# Patient Record
Sex: Male | Born: 1945
Health system: Southern US, Community
[De-identification: ages and names within clinical notes are randomized; demographics above are authoritative.]

## PROBLEM LIST (undated history)

## (undated) DIAGNOSIS — E611 Iron deficiency: Secondary | ICD-10-CM

## (undated) DIAGNOSIS — I739 Peripheral vascular disease, unspecified: Secondary | ICD-10-CM

## (undated) DIAGNOSIS — N201 Calculus of ureter: Secondary | ICD-10-CM

## (undated) DIAGNOSIS — T4145XA Adverse effect of unspecified anesthetic, initial encounter: Secondary | ICD-10-CM

## (undated) DIAGNOSIS — B029 Zoster without complications: Secondary | ICD-10-CM

## (undated) DIAGNOSIS — Z87448 Personal history of other diseases of urinary system: Secondary | ICD-10-CM

## (undated) DIAGNOSIS — Z9889 Other specified postprocedural states: Secondary | ICD-10-CM

## (undated) DIAGNOSIS — Z860100 Personal history of colon polyps, unspecified: Secondary | ICD-10-CM

## (undated) DIAGNOSIS — E785 Hyperlipidemia, unspecified: Secondary | ICD-10-CM

## (undated) DIAGNOSIS — Z973 Presence of spectacles and contact lenses: Secondary | ICD-10-CM

## (undated) DIAGNOSIS — T8859XA Other complications of anesthesia, initial encounter: Secondary | ICD-10-CM

## (undated) DIAGNOSIS — N189 Chronic kidney disease, unspecified: Secondary | ICD-10-CM

## (undated) DIAGNOSIS — Z972 Presence of dental prosthetic device (complete) (partial): Secondary | ICD-10-CM

## (undated) DIAGNOSIS — Z85820 Personal history of malignant melanoma of skin: Secondary | ICD-10-CM

## (undated) DIAGNOSIS — T148XXA Other injury of unspecified body region, initial encounter: Secondary | ICD-10-CM

## (undated) DIAGNOSIS — R05 Cough: Secondary | ICD-10-CM

## (undated) DIAGNOSIS — N400 Enlarged prostate without lower urinary tract symptoms: Secondary | ICD-10-CM

## (undated) DIAGNOSIS — Z87442 Personal history of urinary calculi: Secondary | ICD-10-CM

## (undated) DIAGNOSIS — I10 Essential (primary) hypertension: Secondary | ICD-10-CM

## (undated) DIAGNOSIS — J449 Chronic obstructive pulmonary disease, unspecified: Secondary | ICD-10-CM

## (undated) DIAGNOSIS — E119 Type 2 diabetes mellitus without complications: Secondary | ICD-10-CM

## (undated) HISTORY — PX: LITHOTRIPSY: SUR834

## (undated) HISTORY — DX: Chronic kidney disease, unspecified: N18.9

## (undated) HISTORY — DX: Zoster without complications: B02.9

---
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
## 1992-06-23 HISTORY — PX: MULTIPLE TOOTH EXTRACTIONS: SHX2053

## 1993-06-23 HISTORY — PX: MANDIBLE FRACTURE SURGERY: SHX706

## 1994-06-23 HISTORY — PX: MOHS SURGERY: SUR867

## 2001-02-08 ENCOUNTER — Encounter: Payer: Self-pay | Admitting: Emergency Medicine

## 2001-02-08 ENCOUNTER — Emergency Department (HOSPITAL_COMMUNITY): Admission: EM | Admit: 2001-02-08 | Discharge: 2001-02-08 | Payer: Self-pay | Admitting: *Deleted

## 2001-02-10 ENCOUNTER — Ambulatory Visit (HOSPITAL_COMMUNITY): Admission: RE | Admit: 2001-02-10 | Discharge: 2001-02-10 | Payer: Self-pay | Admitting: Pediatrics

## 2001-02-10 ENCOUNTER — Encounter: Payer: Self-pay | Admitting: *Deleted

## 2002-08-26 ENCOUNTER — Emergency Department (HOSPITAL_COMMUNITY): Admission: EM | Admit: 2002-08-26 | Discharge: 2002-08-26 | Payer: Self-pay | Admitting: Emergency Medicine

## 2002-08-26 ENCOUNTER — Encounter: Payer: Self-pay | Admitting: Emergency Medicine

## 2002-08-28 ENCOUNTER — Emergency Department (HOSPITAL_COMMUNITY): Admission: EM | Admit: 2002-08-28 | Discharge: 2002-08-28 | Payer: Self-pay | Admitting: Emergency Medicine

## 2005-08-19 ENCOUNTER — Encounter (INDEPENDENT_AMBULATORY_CARE_PROVIDER_SITE_OTHER): Payer: Self-pay | Admitting: *Deleted

## 2005-08-19 ENCOUNTER — Ambulatory Visit (HOSPITAL_BASED_OUTPATIENT_CLINIC_OR_DEPARTMENT_OTHER): Admission: RE | Admit: 2005-08-19 | Discharge: 2005-08-19 | Payer: Self-pay | Admitting: Urology

## 2005-08-19 HISTORY — PX: OTHER SURGICAL HISTORY: SHX169

## 2005-08-23 ENCOUNTER — Encounter: Admission: RE | Admit: 2005-08-23 | Discharge: 2005-08-23 | Payer: Self-pay | Admitting: Family Medicine

## 2007-02-27 ENCOUNTER — Emergency Department (HOSPITAL_COMMUNITY): Admission: EM | Admit: 2007-02-27 | Discharge: 2007-02-27 | Payer: Self-pay | Admitting: Emergency Medicine

## 2010-11-08 NOTE — Consult Note (Signed)
[facility]  Patient:    Chad Gomez, Chad Gomez Visit Number: [id number] MRN: [id number]          Service Type: EMS Location: ED Attending Physician:  Carmelina Peal Proc. Date: 02/08/01 Adm. Date:  [id number] Disc. Date: [id number]                            Consultation Report  HISTORY OF PRESENT ILLNESS:  This patient, age 65, with no prior history of stones, presented in the [facility] Emergency Room, February 08, 2001, reported severe right flank and groin pain associated with nausea, no vomiting.  He had not noted gross blood, gravel or stone in the urine, had no history of urinary tract infections; he had, however, had a prostate biopsy which was benign three or four years ago by Dr. Roma Kayser, Gomez in [place].  He thinks he might have had prostatitis.  The patient voids with a good flow every one to two hours, does not get up much at night.  ALLERGIES:  ALEVE caused hives.  MEDICATIONS:  No regular medicines.  Has been using multivitamins recently.  FAMILY HISTORY:  He thinks his mother may have had stones; she also had hypertension and stroke, died at age 56.  Father living at age 40, had some type of cancer, either the bladder or kidneys, and he thinks hypertension also.  To his knowledge, there are no diabetics or bleeders in the family, only the strokes and hypertension and the cancer.  He has three sisters and one brother; they are all in good health as far as he knows.  SOCIAL HISTORY:  The patient smokes two packs of cigarettes daily, uses alcohol only socially.  He works as a Teacher, English as a foreign language for [employer] and is divorced with two daughters.  REVIEW OF SYSTEMS:  General health has been stable.  No change in his weight. HEENT:  Vision and hearing good.  He does have glasses.  No problems with his teeth.  CARDIORESPIRATORY:  He denies any shortness of breath, heart attack, cough and no hemoptysis.  GI:  He did have an episode  of vomiting a few weeks ago following some partying and there was a little trace of blood.  He has no history of peptic ulcer disease.  No hepatitis.  Bowels move normally.  BONES, JOINTS AND MUSCLES:  Denies arthritis and gout.  NEUROPSYCHIATRIC:  No fainting, falling-out spells or stroke.    PHYSICAL EXAMINATION:  GENERAL:  The physical examination reveals a well-developed, well-nourished 65 year old male.  VITAL SIGNS:  Temperature is 96, pulse 82, respirations 20, blood pressure 178/118.  HEENT:  Unremarkable.  NECK:  Free of nodes and masses.  CHEST:  Clear to percussion and auscultation.  No murmur.  ABDOMEN:  A little flank tenderness.  Liver, kidney, spleen, masses, tenderness or hernia not detected.  GENITALIA:  The penis and testes are unremarkable.  Meatus normal.  Scrotum, anus and perineum normal.  RECTAL:  Deferred, will be done in the office.  EXTREMITIES:  Free of edema and fairly good pulses.  NEUROLOGIC:  Grossly normal reflexes and sensation.  IMAGING STUDY:  CT scan of stone protocol showed a 3 x 5-mm calculus, right upper ureter, of minimal obstruction at about the L2-3 level and some 2-mm calcifications, at least two of them, in the left kidney.  PLAN:  Our plan is for the patient to go home on Levaquin 500 mg daily, strain urine,  force fluids, we will give him Mepergan Fortis one or two q.4h. p.r.n. for pain and have him call the office in the morning for followup appointment. D:  02/08/01 TD:  02/09/01 Job: [id number]

## 2010-11-08 NOTE — Op Note (Signed)
NAME:  Chad Gomez, Chad Gomez              ACCOUNT NO.:  [id number]   MEDICAL RECORD NO.:  [id number]          PATIENT TYPE:  AMB   LOCATION:  [facility]                         FACILITY:  [facility]   PHYSICIAN:  Excell Seltzer. Annabell Howells, M.D.    DATE OF BIRTH:  [birth year]   DATE OF PROCEDURE:  08/19/2005  DATE OF DISCHARGE:                                 OPERATIVE REPORT   PROCEDURE:  1.  Transrectal ultrasound with ultrasound-guided prostate biopsy.  2.  Cystolithalopaxy of 1.7 cm stone.   PREOPERATIVE DIAGNOSIS:  Elevated PSA and bladder stone.   POSTOPERATIVE DIAGNOSIS:  Elevated PSA and bladder stone.   SURGEON:  Dr. Bjorn Pippin.   ANESTHESIA:  General.   DRAIN:  18-French Foley catheter.   SPECIMEN:  Stone fragments.   COMPLICATIONS:  None.   INDICATIONS:  [name] is a 65 year old white male who originally  presented with irritative voiding symptoms and elevated PSA. He had a  history of stones and negative prostate biopsies in the past. His PSA was  elevated at 6.73 and imaging studies and cystoscopy revealed a 1.7 cm stone  in the bladder as well as renal calculi. It was felt that prostate biopsy  and cystolithalopaxy were indicated.   FINDINGS AND PROCEDURE:  The patient was taken to the operating room after  receiving appropriate antibiotic coverage with Cipro. A general anesthetic  was induced. He was placed in lithotomy position.   A transrectal ultrasound was performed with an 8 MHz probe. Examination  revealed normal-appearing seminal vesicles. The prostate was symmetrical. In  the right base was an area of calcification toward the transitional zone.  There were a few other small calcifications within the prostate. The  peripheral zone had a generally isoechoic echotexture. The transitional zone  was somewhat enlarged with variegated echotexture. The sagittal views  revealed no additional abnormalities. The prostate seminal vesicle angles  were normal. The prostate volume was 47  mL.   After completion of the diagnostic scan, a 12 core bladder biopsy was  performed in the usual fashion without complications. Biopsies were obtained  from the right base mid and apical region and the left base mid and apical  regions.   After completion of the prostate biopsies, the patient's perineum and  genitalia were prepped with Betadine solution and he was draped in the usual  sterile fashion. Cystoscopy was performed using a 22-French scope and a 12  degree lens. Examination revealed a normal urethra. The external sphincter  was intact. The prostatic urethra had trilobar hyperplasia with obstruction.  Examination of the bladder revealed mild trabeculation. There was a stone in  the base of the bladder consistent with the stone seen on x-ray and office  cystoscopy.   The 1 mm laser fiber was then passed through the scope with the power  initially set on 2.5 watts but eventually increased to 4.8 watts. The stone  was fragmented completely with the laser over a period of approximately 30  minutes of continuous treatment. Once the stone had been completely  fragmented, the fragments were evacuated from the bladder. Final cystoscopic  inspection revealed some mild submucosal hemorrhage on the posterior wall  which was not surprising. The ureteral orifices were unremarkable. There was  no significant bleeding from the prostatic urethra. It appeared that the  bladder was completely free of fragments.   At this point, an 18-French Foley catheter was inserted, the balloon was  filled with 10 mL of sterile fluid. The catheter was placed to straight  drainage. The patient was taken down from lithotomy position, his anesthetic  was reversed and he was moved to the recovery room in stable condition.  There were no complications.      Excell Seltzer. Annabell Howells, M.D.  Electronically Signed     JJW/MEDQ  D:  08/19/2005  T:  08/20/2005  Job:  [id number]   cc:   Leanne Chang, M.D.  Fax:  [phone]

## 2010-11-08 NOTE — H&P (Signed)
[facility]  Patient:    Chad Gomez, Chad Gomez Visit Number: [id number] MRN: [id number]          Service Type: EMS Location: ED Attending Physician:  Carmelina Peal Dictated by:   Radene Knee., M.D. Adm. Date:  02/08/2001 Disc. Date: 02/08/2001                           History and Physical  This 65 year old male is scheduled at [facility] on February 12, 2001, for a right ureteroscopic stone extraction with a chief complaint of right-sided pain, onset February 08, 2001.  HISTORY OF PRESENT ILLNESS:  This 65 year old male gives no previous history of stones.  He did some 3 or 4 years ago have a benign biopsy of the prostate performed by Dr. Seward Grater in [place].  He was seen in the [facility] Emergency Room on February 08, 2001, with severe rather sudden onset of pain in the right side.  He had some nausea, no vomiting.  A CT scan performed at [facility] suggested a 3 mm calculus in the upper right ureter just below the ureteropelvic junction.  There was grade I hydronephrosis.  There was a small calcification in the left kidney, but no hydronephrosis and no ureteral calculi.  The patient was treated with Demerol and Phenergan, and came to our office on February 10, 2001, stating that his pain had recurred earlier that day, and that he had required Demerol almost every 2 to 3 hours since about 4 a.m. to get relief.  He was sent for a KUB which suggested that the right ureteral calculus had migrated into the lower right ureter near the ureterovesicle junction.  He still had grade I hydronephrosis by ultrasound, and his urinalysis showed microscopic hematuria, 1 to 4 red cells, 2 to 5 white cells, no protein, no sugar, specific gravity 1015, pH is 6.  The patient was given Cipro 500 mg b.i.d., a strainer, and advised that if his stone did not pass he should have stone extraction on February 12, 2001.  He is instructed to call us about  any fever or vomiting or change in his symptoms.  ALLERGIES:  ALEVE caused hives.  REGULAR MEDICATIONS: 1. Centrum Silver. 2. Cipro 500 mg b.i.d., onset 02/08/01. 3. Mepergan Fortis one or two q.4-6h. p.r.n. pain.  PAST MEDICAL HISTORY: 1. He had a right inguinal hernia repair as a child. 2. Broken jaw in 1995, and as a result of that had damage of his teeth, and    ultimately all of his teeth had to be removed and replaced with dental    plates.  He has had no serious medical problems.  Has been told on occasion that he had elevated blood pressure.  FAMILY HISTORY:  Positive for heart disease, hypertension.  His mother thinks may of had some stones, but he is not sure whether they were gallstones or kidney stones.  He thinks that his mother died of natural causes in her late 81s.  He knows very little about his fathers age or health.  He has three sisters, as far as he knows are in good health.  He has one brother.  He has two children.  He is divorced.  SOCIAL HISTORY:  He works for [employer].  He smokes 2 packs of cigarettes daily which he has done for 30 years.  He uses alcohol only occasionally.  REVIEW OF SYSTEMS:  GENERAL:  General health has been fairly good.  Weight stable.  HEENT:  He does wear glasses.  He has had his teeth removed.  CARDIORESPIRATORY:  He denies any chest pain, heart attack, feet do not swell.  GASTROINTESTINAL:  No hepatitis or peptic ulcer disease.  Bowels move normally.  NEUROPSYCHIATRIC:  Denies any stroke, fainting, or falling out spells.  SKIN:  Noted no rashes or problems.  HEMATOPOIETIC AND LYMPHATIC:  He has had no history of anemias or node enlargements.  PHYSICAL EXAMINATION:  GENERAL:  Well-developed, well-nourished, acutely ill male, age 18, with pain in his right side.  VITAL SIGNS:  Blood pressure 160/100, temperature 98, pulse 90, respiratory rate 16.  HEENT:  Ears and tympanic membranes are unremarkable.  Eyes  react normally to light and accommodation.  Extraocular movements intact.  Pharynx benign. Mouth is edentulous.  NECK:  No enlargement of the thyroid, no nodes are palpable.  CHEST:  Clear to auscultation and percussion.  HEART:  Normal sinus rhythm, no murmur.  He does have a tachycardia.  ABDOMEN:  Flat, there is a little right flank tenderness.  Liver, kidney, spleen masses, hernia not noted, but he does have a right inguinal scar.  GENITOURINARY:  The penis is circumcised, the meatus is normal.  Testes are good size and symmetrical.  Scrotum, anus, perineum normal.  Rectal tone good prostate, enlarged to about 30 g, firm, with just a hint of fine nodularity. No tenderness, symmetrical, broad.  EXTREMITIES:  Good peripheral pulses.  No edema.  NEUROLOGIC:  Grossly normal reflexes and sensation.  IMPRESSION: 1. Right ureterovesicle junction calculus with grade I hydronephrosis, 3 to    4 mm calculus. 2. Left renal calcifications, small, nonobstructive. 3. Right inguinal hernia repaired during childhood. 4. Fractured jaw with removal of teeth and replacement of dentures. 5. Gallstones, asymptomatic. 6. Benign prostatic biopsy in 1998.  PLAN:  Cystoscopy and right ureteroscopy with stone extraction.  We will get routine blood work, including PSA, and we will have him strain his urine and get a KUB prior to the procedure to make sure his stone has not moved or passed. Dictated by:   Radene Knee., M.D. Attending Physician:  Carmelina Peal DD:  02/10/01 TD:  02/11/01 Job: [id number]

## 2011-04-04 LAB — BASIC METABOLIC PANEL
BUN: 10
CO2: 28
Calcium: 9.8
Chloride: 104
Creatinine, Ser: 1.17
GFR calc Af Amer: >60
GFR calc non Af Amer: >60
Glucose, Bld: 102 — ABNORMAL HIGH
Potassium: 4.7
Sodium: 136

## 2012-11-24 ENCOUNTER — Other Ambulatory Visit: Payer: Self-pay | Admitting: Urology

## 2012-11-26 ENCOUNTER — Encounter (HOSPITAL_BASED_OUTPATIENT_CLINIC_OR_DEPARTMENT_OTHER): Payer: Self-pay | Admitting: *Deleted

## 2012-11-26 NOTE — Progress Notes (Signed)
SPOKE W/ PT WIFE, [name]. NPO AFTER MN. ARRIVE AT 0900. NEEDS ISTAT AND EKG. WILL DO FLONASE NASAL SPRAY AND IF NEEDED TAKE HYDROCODONE W/ SIPS OF WATER.

## 2012-12-01 NOTE — H&P (Signed)
ctive Problems Problems  1. Bladder Calculus 594.1 2. Microscopic Hematuria 599.72 3. Nephrolithiasis Of Both Kidneys 592.0 4. Prostate Hard Area Or Nodule 600.10 5. PSA,Elevated 790.93 6. Pyuria 791.9  History of Present Illness           Chad Gomez is a former patient who I last saw in 2007 with an elevated PSA and bladder stone.  He is sent back in consultation by Dr. Lerry Liner for a further PSA elevation.   He had a negative biopsy then and the bladder stone was removed.  He had bilateral renal stones at that time.  He had a history of PSA elevation then and a prior negative biopsy in 2004 in [place] and 2 negative biopsies prior to that in [place].  His PSA in 2007 was 6.73.  There was an interim PSA that was 4.1 and last May it was up to 7.73.  He had possible orchitis then and was given Rocephin and doxycycline.  He was given Levaquin for 7 days in 2/14 for bronchitis and a repeat PSA was ordered but I don't have those results yet.   He has no significant voiding complaints.  He has had no gross hematuria.  His UA today has 7-10 WBC and 3-6 RBC's.   He has had 3 prior stones and required ESWL.  He had been on prednisone and calcium at that time which the patient thinks caused the stones.   Past Medical History Problems  1. History of  Alopecia Areata 704.01 2. History of  Arthritis V13.4 3. History of  Hypertension 401.9 4. History of  Nephrolithiasis V13.01  Surgical History Problems  1. History of  Biopsy Of The Prostate Needle 2. History of  Cystoscopy With Fragmentation Of Bladder Calculus 3. History of  Inguinal Hernia Repair Right 4. History of  Lithotripsy  Current Meds 1. Azor 10-40 MG Oral Tablet; Therapy: (Recorded:28May2014) to 2. Flonase 50 MCG/ACT Nasal Suspension; Therapy: (Recorded:28May2014) to 3. Gabapentin 100 MG Oral Capsule; Therapy: (Recorded:28May2014) to 4. Hydrocodone-Acetaminophen 7.5-325 MG Oral Tablet; Therapy: (Recorded:28May2014) to 5. Iron TABS;  Therapy: (Recorded:28May2014) to 6. Meloxicam 7.5 MG Oral Tablet; Therapy: (Recorded:28May2014) to 7. Niacin 500 MG Oral Tablet; Therapy: (Recorded:28May2014) to  Allergies Medication  1. Amoxicillin TABS 2. Naproxen TABS  Family History Problems  1. Family history of  Death In The Family Father 2. Family history of  Death In The Family Mother 3. Family history of  Family Health Status Number Of Children 4. Sororal history of  Hypertension V17.49  Social History Problems    Marital History - Currently Married   Occupation:   Tobacco Use 305.1 Denied    History of  Alcohol Use   History of  Caffeine Use  Review of Systems Genitourinary, constitutional, skin, eye, otolaryngeal, hematologic/lymphatic, cardiovascular, pulmonary, endocrine, musculoskeletal, gastrointestinal, neurological and psychiatric system(s) were reviewed and pertinent findings if present are noted.  Genitourinary: urinary frequency, nocturia and erectile dysfunction.  Constitutional: feeling tired (fatigue).  ENT: sinus problems.  Respiratory: cough.  Musculoskeletal: joint pain.    Vitals Vital Signs [Data Includes: Last 1 Day]  28May2014 09:59AM  Blood Pressure: 145 / 89 Temperature: 98.3 F Heart Rate: 86 28May2014 09:45AM  BMI Calculated: 26.49 BSA Calculated: 2.02 Height: 5 ft 10 in Weight: 185 lb   Physical Exam Constitutional: Well nourished and well developed . No acute distress.  ENT:. The ears and nose are normal in appearance.  Neck: The appearance of the neck is normal and no neck mass is  present.  Pulmonary: No respiratory distress and normal respiratory rhythm and effort.  Cardiovascular: Heart rate and rhythm are normal . No peripheral edema.  Abdomen: The abdomen is rounded. The abdomen is soft and nontender. No masses are palpated. No CVA tenderness. No hernias are palpable. No hepatosplenomegaly noted.  Rectal: Rectal exam demonstrates normal sphincter tone, no tenderness and  no masses. Estimated prostate size is 3+. The prostate has no nodularity, is indurated (right slightly more prominent than the left. ) and is not tender. The left seminal vesicle is nonpalpable. The right seminal vesicle is nonpalpable. The perineum is normal on inspection.  Genitourinary: Examination of the penis demonstrates no discharge, no masses, no lesions and a normal meatus. The scrotum is without lesions. The right epididymis is palpably normal and non-tender. The left epididymis is palpably normal and non-tender. The right testis is non-tender and without masses. The left testis is non-tender and without masses.  Lymphatics: The femoral and inguinal nodes are not enlarged or tender.  Skin: Normal skin turgor, no visible rash and no visible skin lesions.  Neuro/Psych:. Mood and affect are appropriate. Normal sensation of the perineum/perianal region (S3,4,5).    Results/Data Urine [Data Includes: Last 1 Day]   28May2014  COLOR YELLOW   APPEARANCE CLEAR   SPECIFIC GRAVITY 1.010   pH 6.0   GLUCOSE NEG mg/dL  BILIRUBIN NEG   KETONE NEG mg/dL  BLOOD MOD   PROTEIN NEG mg/dL  UROBILINOGEN 0.2 mg/dL  NITRITE NEG   LEUKOCYTE ESTERASE SMALL   SQUAMOUS EPITHELIAL/HPF RARE   WBC 7-10 WBC/hpf  RBC 3-6 RBC/hpf  BACTERIA RARE   CRYSTALS NONE SEEN   CASTS NONE SEEN    Old records or history reviewed: Outside records and labs from Dr. Mayford Knife were reviewed. I have reviewed our prior office notes, labs and x-rays.  The following images/tracing/specimen were independently visualized:  KUB today shows a partial staghorn in the left upper pole. This has grown since 2007 and is now 38 x 15mm. there is a stable 1cm RLP stone. There is a 12mm cluster of stones that could be at the right UVJ or in the bladder. There is a 13mm stone that appears to be in the bladder along with a 4mm stone. He has several calcified gallstones. No other abnormalities are noted.  Will request old records/history: I have  requested additional PSA results if they are available from Dr. Mayford Knife.    Assessment  He has a history of an elevated PSA with 3 prior negative biopsies with the last in 2007.   His PSA was 7.73 in 5/13 which is up only 1 point since 2007.  He may have a more recent result but I don't have that.  He has urolithiasis with interval growth and now has a 3.8cm left partial staghorn stones along with multiple bladder or possibly right distal ureteral stones.  He is remarkably assymptomatic, but he does have some prostate assymetry, right > left.   Plan  Health Maintenance (V70.0)  1. UA With REFLEX  Done: 28May2014 09:35AM Microscopic Hematuria (599.72)  2. BUN & CREATININE  Done: 28May2014 3. Follow-up Pending Results Office  Follow-up  Requested for: 28May2014 Microscopic Hematuria (599.72), Nephrolithiasis Of Both Kidneys (592.0)  4. AU CT-HEMATURIA PROTOCOL  Requested for: 28May2014 Nephrolithiasis Of Both Kidneys (592.0)  5. KUB  Done: 28May2014 12:00AM PSA,Elevated (790.93)  6. PSA REFLEX TO FREE  Done: 28May2014   Urine culture today. PSA and BUN/Cr today. I will get additional  PSA results from Dr. Mayford Knife if available.  He will return for a CT hematuria study to better clarify his anatomy and I will discuss those results prior to arranging further treatment and evaluation.    Get Outside Records Office  Follow-up  Status: Hold For - Records,Records Release  Requested for: 28May2014 Ordered Stat; For: PSA,Elevated (790.93); Ordered By: Bjorn Pippin  Performed:   Order Comments: I have received records and his PSA from last year but not the PSA from this year.   I am seeing the patient now. called office is closed on Wed  Due: 30May2014 Marked Important; Last Updated By: Ysidro Evert URINE CULTURE  Status: In Progress - Specimen/Data Collected  Done: 28May2014 Ordered Today; For: Pyuria (791.9); Ordered By: Elliot Gault  Due: 30May2014 Marked Important; Last  Updated By: Larena Sox   Discussion/Summary  CC: Dr. Lerry Liner.   His CT showed right distal ureteral stones and chronic obstruction with some atrophy on the right.   He has a bladder stone and the left upper pole partial staghorn stone is obstructing the upper pole.   He is going to have cystolithalopaxy and right ureteroscopy and I reviewed the risks.  We will need to deal with the left renal stone at a later date.

## 2012-12-02 ENCOUNTER — Encounter (HOSPITAL_BASED_OUTPATIENT_CLINIC_OR_DEPARTMENT_OTHER): Payer: Self-pay | Admitting: Anesthesiology

## 2012-12-02 ENCOUNTER — Ambulatory Visit (HOSPITAL_BASED_OUTPATIENT_CLINIC_OR_DEPARTMENT_OTHER)
Admission: RE | Admit: 2012-12-02 | Discharge: 2012-12-02 | Disposition: A | Discharge status: Home / Self Care | Payer: BC Managed Care – PPO | Source: Ambulatory Visit | Attending: Urology | Admitting: Urology

## 2012-12-02 ENCOUNTER — Ambulatory Visit (HOSPITAL_BASED_OUTPATIENT_CLINIC_OR_DEPARTMENT_OTHER): Payer: BC Managed Care – PPO | Admitting: Anesthesiology

## 2012-12-02 ENCOUNTER — Other Ambulatory Visit: Payer: Self-pay

## 2012-12-02 ENCOUNTER — Encounter (HOSPITAL_BASED_OUTPATIENT_CLINIC_OR_DEPARTMENT_OTHER)
Admission: RE | Disposition: A | Discharge status: Home / Self Care | Payer: Self-pay | Source: Ambulatory Visit | Attending: Urology

## 2012-12-02 DIAGNOSIS — R05 Cough: Secondary | ICD-10-CM | POA: Insufficient documentation

## 2012-12-02 DIAGNOSIS — M129 Arthropathy, unspecified: Secondary | ICD-10-CM | POA: Insufficient documentation

## 2012-12-02 DIAGNOSIS — N200 Calculus of kidney: Secondary | ICD-10-CM | POA: Insufficient documentation

## 2012-12-02 DIAGNOSIS — R972 Elevated prostate specific antigen [PSA]: Secondary | ICD-10-CM | POA: Insufficient documentation

## 2012-12-02 DIAGNOSIS — R5381 Other malaise: Secondary | ICD-10-CM | POA: Insufficient documentation

## 2012-12-02 DIAGNOSIS — K8020 Calculus of gallbladder without cholecystitis without obstruction: Secondary | ICD-10-CM | POA: Insufficient documentation

## 2012-12-02 DIAGNOSIS — R351 Nocturia: Secondary | ICD-10-CM | POA: Insufficient documentation

## 2012-12-02 DIAGNOSIS — R350 Frequency of micturition: Secondary | ICD-10-CM | POA: Insufficient documentation

## 2012-12-02 DIAGNOSIS — L639 Alopecia areata, unspecified: Secondary | ICD-10-CM | POA: Insufficient documentation

## 2012-12-02 DIAGNOSIS — N201 Calculus of ureter: Secondary | ICD-10-CM | POA: Insufficient documentation

## 2012-12-02 DIAGNOSIS — R059 Cough, unspecified: Secondary | ICD-10-CM | POA: Insufficient documentation

## 2012-12-02 DIAGNOSIS — N529 Male erectile dysfunction, unspecified: Secondary | ICD-10-CM | POA: Insufficient documentation

## 2012-12-02 DIAGNOSIS — N402 Nodular prostate without lower urinary tract symptoms: Secondary | ICD-10-CM | POA: Insufficient documentation

## 2012-12-02 DIAGNOSIS — N210 Calculus in bladder: Secondary | ICD-10-CM | POA: Insufficient documentation

## 2012-12-02 DIAGNOSIS — I10 Essential (primary) hypertension: Secondary | ICD-10-CM | POA: Insufficient documentation

## 2012-12-02 DIAGNOSIS — Z79899 Other long term (current) drug therapy: Secondary | ICD-10-CM | POA: Insufficient documentation

## 2012-12-02 HISTORY — DX: Personal history of other diseases of urinary system: Z87.448

## 2012-12-02 HISTORY — PX: HOLMIUM LASER APPLICATION: SHX5852

## 2012-12-02 HISTORY — PX: CYSTOSCOPY WITH LITHOLAPAXY: SHX1425

## 2012-12-02 HISTORY — DX: Iron deficiency: E61.1

## 2012-12-02 HISTORY — DX: Calculus of ureter: N20.1

## 2012-12-02 HISTORY — PX: CYSTOSCOPY W/ RETROGRADES: SHX1426

## 2012-12-02 HISTORY — DX: Essential (primary) hypertension: I10

## 2012-12-02 HISTORY — PX: URETEROSCOPY: SHX842

## 2012-12-02 LAB — POCT I-STAT 4, (NA,K, GLUC, HGB,HCT)
Glucose, Bld: 94 mg/dL (ref 70–99)
Hemoglobin: 16 g/dL (ref 13.0–17.0)

## 2012-12-02 SURGERY — CYSTOSCOPY, WITH BLADDER CALCULUS LITHOLAPAXY
Anesthesia: General | Site: Ureter | Laterality: Right

## 2012-12-02 MED ORDER — OXYCODONE-ACETAMINOPHEN 5-325 MG PO TABS: 1.0000 | ORAL_TABLET | ORAL | Status: DC | PRN

## 2012-12-02 MED ORDER — LACTATED RINGERS IV SOLN
INTRAVENOUS | Status: DC
  Administered 2012-12-02 (×3): via INTRAVENOUS
  Filled 2012-12-02: qty 1000

## 2012-12-02 MED ORDER — PHENAZOPYRIDINE HCL 200 MG PO TABS: 200.0000 mg | ORAL_TABLET | Freq: Three times a day (TID) | ORAL | Status: DC | PRN

## 2012-12-02 MED ORDER — DEXAMETHASONE SODIUM PHOSPHATE 4 MG/ML IJ SOLN
INTRAMUSCULAR | Status: DC | PRN
  Administered 2012-12-02: 10 mg via INTRAVENOUS

## 2012-12-02 MED ORDER — LIDOCAINE HCL (CARDIAC) 20 MG/ML IV SOLN
INTRAVENOUS | Status: DC | PRN
  Administered 2012-12-02: 75 mg via INTRAVENOUS

## 2012-12-02 MED ORDER — SODIUM CHLORIDE 0.9 % IR SOLN
Status: DC | PRN
  Administered 2012-12-02: 6000 mL

## 2012-12-02 MED ORDER — FENTANYL CITRATE 0.05 MG/ML IJ SOLN
INTRAMUSCULAR | Status: DC | PRN
  Administered 2012-12-02: 50 ug via INTRAVENOUS
  Administered 2012-12-02 (×4): 25 ug via INTRAVENOUS

## 2012-12-02 MED ORDER — CIPROFLOXACIN IN D5W 400 MG/200ML IV SOLN
400.0000 mg | INTRAVENOUS | Status: AC
  Administered 2012-12-02: 400 mg via INTRAVENOUS
  Filled 2012-12-02: qty 200

## 2012-12-02 MED ORDER — ONDANSETRON HCL 4 MG/2ML IJ SOLN
INTRAMUSCULAR | Status: DC | PRN
  Administered 2012-12-02: 4 mg via INTRAVENOUS

## 2012-12-02 MED ORDER — PROPOFOL 10 MG/ML IV BOLUS
INTRAVENOUS | Status: DC | PRN
  Administered 2012-12-02: 200 mg via INTRAVENOUS

## 2012-12-02 MED ORDER — BELLADONNA ALKALOIDS-OPIUM 16.2-60 MG RE SUPP
RECTAL | Status: DC | PRN
  Administered 2012-12-02: 1 via RECTAL

## 2012-12-02 MED ORDER — IOHEXOL 350 MG/ML SOLN
INTRAVENOUS | Status: DC | PRN
  Administered 2012-12-02: 6 mL

## 2012-12-02 MED ORDER — ONDANSETRON 4 MG PO TBDP: 4.0000 mg | ORAL_TABLET | Freq: Three times a day (TID) | ORAL | Status: DC | PRN

## 2012-12-02 SURGICAL SUPPLY — 46 items
ADAPTER CATH URET PLST 4-6FR (CATHETERS) ×1 IMPLANT
ADPR CATH URET STRL DISP 4-6FR (CATHETERS) ×2
BAG DRAIN URO-CYSTO SKYTR STRL (DRAIN) ×3 IMPLANT
BAG DRN UROCATH (DRAIN) ×2
BASKET LASER NITINOL 1.9FR (BASKET) IMPLANT
BASKET STNLS GEMINI 4WIRE 3FR (BASKET) IMPLANT
BASKET ZERO TIP NITINOL 2.4FR (BASKET) ×1 IMPLANT
BRUSH URET BIOPSY 3F (UROLOGICAL SUPPLIES) IMPLANT
BSKT STON RTRVL 120 1.9FR (BASKET)
BSKT STON RTRVL GEM 120X11 3FR (BASKET)
BSKT STON RTRVL ZERO TP 2.4FR (BASKET) ×2
CANISTER SUCT LVC 12 LTR MEDI- (MISCELLANEOUS) ×3 IMPLANT
CATH INTERMIT  6FR 70CM (CATHETERS) IMPLANT
CATH URET 5FR 28IN CONE TIP (BALLOONS)
CATH URET 5FR 28IN OPEN ENDED (CATHETERS) ×1 IMPLANT
CATH URET 5FR 70CM CONE TIP (BALLOONS) IMPLANT
CLOTH BEACON ORANGE TIMEOUT ST (SAFETY) ×3 IMPLANT
DRAPE CAMERA CLOSED 9X96 (DRAPES) ×3 IMPLANT
ELECT REM PT RETURN 9FT ADLT (ELECTROSURGICAL)
ELECTRODE REM PT RTRN 9FT ADLT (ELECTROSURGICAL) IMPLANT
ELECTROHYDROLIC PROBE 9FR (MISCELLANEOUS) IMPLANT
GLOVE ECLIPSE 6.5 STRL STRAW (GLOVE) ×2 IMPLANT
GLOVE INDICATOR 6.5 STRL GRN (GLOVE) ×2 IMPLANT
GLOVE SURG SS PI 8.0 STRL IVOR (GLOVE) ×3 IMPLANT
GOWN STRL NON-REIN LRG LVL3 (GOWN DISPOSABLE) ×3 IMPLANT
GOWN STRL REIN XL XLG (GOWN DISPOSABLE) ×3 IMPLANT
GOWN XL W/COTTON TOWEL STD (GOWNS) ×3 IMPLANT
GUIDEWIRE 0.038 PTFE COATED (WIRE) IMPLANT
GUIDEWIRE ANG ZIPWIRE 038X150 (WIRE) ×1 IMPLANT
GUIDEWIRE STR DUAL SENSOR (WIRE) ×7 IMPLANT
IV NS IRRIG 3000ML ARTHROMATIC (IV SOLUTION) ×6 IMPLANT
KIT BALLIN UROMAX 15FX10 (LABEL) IMPLANT
KIT BALLN UROMAX 15FX4 (MISCELLANEOUS) IMPLANT
KIT BALLN UROMAX 26 75X4 (MISCELLANEOUS)
LASER FIBER DISP (UROLOGICAL SUPPLIES) ×2 IMPLANT
LASER FIBER DISP 1000U (UROLOGICAL SUPPLIES) IMPLANT
PACK CYSTOSCOPY (CUSTOM PROCEDURE TRAY) ×3 IMPLANT
PROBE LITHO 3.3FR 2137.235 (UROLOGICAL SUPPLIES) IMPLANT
PROBE LITHO 5.0FR 2137.1505 (MISCELLANEOUS) IMPLANT
SET HIGH PRES BAL DIL (LABEL)
SHEATH ACCESS URETERAL 38CM (SHEATH) IMPLANT
SHEATH ACCESS URETERAL 54CM (SHEATH) IMPLANT
SHEATH URET ACCESS 12FR/35CM (UROLOGICAL SUPPLIES) IMPLANT
SHEATH URET ACCESS 12FR/55CM (UROLOGICAL SUPPLIES) IMPLANT
STENT URET 6FRX26 CONTOUR (STENTS) ×1 IMPLANT
WATER STERILE IRR 500ML POUR (IV SOLUTION) IMPLANT

## 2012-12-02 NOTE — Brief Op Note (Signed)
12/02/2012  12:11 PM  PATIENT:  Chad Gomez  67 y.o. male  PRE-OPERATIVE DIAGNOSIS:  BLADDER STONE AND RIGHT DISTAL URETERAL STONE  POST-OPERATIVE DIAGNOSIS:  BLADDER STONE AND RIGHT DISTAL URETERAL STONE  PROCEDURE:  Procedure(s): CYSTOSCOPY WITH CYSTOLITHALOXAPY <2.5cm(Right) RIGHT URETEROSCOPY STONE EXTRACTION WITH  STENT PLACEMENT (Right) HOLMIUM LASER APPLICATION (Right) CYSTOSCOPY WITH RETROGRADE PYELOGRAM (Right)  SURGEON:  Surgeon(s) and Role:    * Anner Crete, MD - Primary  PHYSICIAN ASSISTANT:   ASSISTANTS: none   ANESTHESIA:   general  EBL:  Total I/O In: 1000 [I.V.:1000] Out: -   BLOOD ADMINISTERED:none  DRAINS: 6x26 right JJ stent   LOCAL MEDICATIONS USED:  NONE  SPECIMEN:  Source of Specimen:  stone fragments from bladder and right ureter  DISPOSITION OF SPECIMEN:  to patient to bring to the office  COUNTS:  YES  TOURNIQUET:  * No tourniquets in log *  DICTATION: .Other Dictation: Dictation Number [id number]  PLAN OF CARE: Discharge to home after PACU  PATIENT DISPOSITION:  PACU - hemodynamically stable.   Delay start of Pharmacological VTE agent (>24hrs) due to surgical blood loss or risk of bleeding: not applicable

## 2012-12-02 NOTE — Interval H&P Note (Signed)
History and Physical Interval Note:  12/02/2012 10:59 AM  Chad Gomez  has presented today for surgery, with the diagnosis of BLADDER STONE AND RIGHT DISTAL URETERAL STONE  The various methods of treatment have been discussed with the patient and family. After consideration of risks, benefits and other options for treatment, the patient has consented to  Procedure(s): CYSTOSCOPY WITH LITHOLAPAXY (Right) RIGHT URETEROSCOPY STONE EXTRACTION WITH POSSIBLE STENT (Right) HOLMIUM LASER APPLICATION (Right) as a surgical intervention .  The patient's history has been reviewed, patient examined, no change in status, stable for surgery.  I have reviewed the patient's chart and labs.  Questions were answered to the patient's satisfaction.     Chad Gomez J

## 2012-12-02 NOTE — Anesthesia Preprocedure Evaluation (Addendum)
Anesthesia Evaluation  Patient identified by MRN, date of birth, ID band Patient awake  General Assessment Comment:1. History of  Alopecia Areata 704.01 2. History of  Arthritis V13.4 3. History of  Hypertension 401.9 4. History of  Nephrolithiasis V13.01   Reviewed: Allergy & Precautions, H&P , NPO status , Patient's Chart, lab work & pertinent test results  Airway Mallampati: II TM Distance: >3 FB Neck ROM: Full    Dental  (+) Upper Dentures and Lower Dentures   Pulmonary Current Smoker,  breath sounds clear to auscultation  Pulmonary exam normal       Cardiovascular Exercise Tolerance: Good hypertension, Pt. on medications negative cardio ROS  Rhythm:Regular Rate:Normal  ECG reviewed: nsr, possible anterior MI, age undetermined.   Neuro/Psych negative neurological ROS  negative psych ROS   GI/Hepatic Neg liver ROS, GERD-  Medicated,  Endo/Other  negative endocrine ROS  Renal/GU negative Renal ROS  negative genitourinary   Musculoskeletal negative musculoskeletal ROS (+)   Abdominal   Peds negative pediatric ROS (+)  Hematology negative hematology ROS (+)   Anesthesia Other Findings   Reproductive/Obstetrics negative OB ROS                         Anesthesia Physical Anesthesia Plan  ASA: II  Anesthesia Plan: General   Post-op Pain Management:    Induction: Intravenous  Airway Management Planned: LMA  Additional Equipment:   Intra-op Plan:   Post-operative Plan: Extubation in OR  Informed Consent: I have reviewed the patients History and Physical, chart, labs and discussed the procedure including the risks, benefits and alternatives for the proposed anesthesia with the patient or authorized representative who has indicated his/her understanding and acceptance.   Dental advisory given  Plan Discussed with: CRNA  Anesthesia Plan Comments:         Anesthesia Quick  Evaluation

## 2012-12-02 NOTE — Transfer of Care (Signed)
Immediate Anesthesia Transfer of Care Note  Patient: Chad Gomez  Procedure(s) Performed: Procedure(s) (LRB): CYSTOSCOPY WITH stone extraction (Right) RIGHT URETEROSCOPY STONE EXTRACTION WITH  STENT PLACEMENT (Right) HOLMIUM LASER APPLICATION (Right) CYSTOSCOPY WITH RETROGRADE PYELOGRAM (Right)  Patient Location: Patient transported to PACU with oxygen via face mask at 4 Liters / Min  Anesthesia Type: General  Level of Consciousness: awake and alert   Airway & Oxygen Therapy: Patient Spontanous Breathing and Patient connected to face mask oxygen  Post-op Assessment: Report given to PACU RN and Post -op Vital signs reviewed and stable  Post vital signs: Reviewed and stable  Dentition: Teeth and oropharynx remain in pre-op condition  Complications: No apparent anesthesia complications

## 2012-12-02 NOTE — Anesthesia Procedure Notes (Signed)
Procedure Name: LMA Insertion Date/Time: 12/02/2012 11:07 AM Performed by: Fran Lowes Pre-anesthesia Checklist: Patient identified, Emergency Drugs available, Suction available and Patient being monitored Patient Re-evaluated:Patient Re-evaluated prior to inductionOxygen Delivery Method: Circle System Utilized Preoxygenation: Pre-oxygenation with 100% oxygen Intubation Type: IV induction Ventilation: Mask ventilation without difficulty LMA: LMA inserted LMA Size: 4.0 Number of attempts: 1 Airway Equipment and Method: bite block Placement Confirmation: positive ETCO2 Tube secured with: Tape Dental Injury: Teeth and Oropharynx as per pre-operative assessment

## 2012-12-03 ENCOUNTER — Encounter (HOSPITAL_BASED_OUTPATIENT_CLINIC_OR_DEPARTMENT_OTHER): Payer: Self-pay | Admitting: Urology

## 2012-12-03 NOTE — Anesthesia Postprocedure Evaluation (Signed)
  Anesthesia Post-op Note  Patient: Chad Gomez  Procedure(s) Performed: Procedure(s) (LRB): CYSTOSCOPY WITH stone extraction (Right) RIGHT URETEROSCOPY STONE EXTRACTION WITH  STENT PLACEMENT (Right) HOLMIUM LASER APPLICATION (Right) CYSTOSCOPY WITH RETROGRADE PYELOGRAM (Right)  Patient Location: PACU  Anesthesia Type: General  Level of Consciousness: awake and alert   Airway and Oxygen Therapy: Patient Spontanous Breathing  Post-op Pain: mild  Post-op Assessment: Post-op Vital signs reviewed, Patient's Cardiovascular Status Stable, Respiratory Function Stable, Patent Airway and No signs of Nausea or vomiting  Last Vitals:  Filed Vitals:   12/02/12 1316  BP: 193/101  Pulse: 79  Temp: 36.2 C  Resp: 18    Post-op Vital Signs: stable   Complications: No apparent anesthesia complications. BP similar to baseline.

## 2012-12-03 NOTE — Op Note (Signed)
NAMEMarland Kitchen  Gomez, Chad              ACCOUNT NO.:  [id number]  MEDICAL RECORD NO.:  [id number]  LOCATION:                                 FACILITY:  PHYSICIAN:  Excell Seltzer. Annabell Howells, M.D.    DATE OF BIRTH:  [birth year]  DATE OF PROCEDURE:  12/02/2012 DATE OF DISCHARGE:                              OPERATIVE REPORT   PROCEDURE:  Cystoscopy, right ureterostomy with holmium lasertripsy, right retrograde pyelogram with interpretation, insertion of right double-J stent, and then cystolitholapaxy less than 2.5 cm stone.  PREOPERATIVE DIAGNOSIS:  Right distal ureteral stone and bladder stone.  POSTOPERATIVE DIAGNOSE:  Right distal ureteral stone and bladder stone.  SURGEON:  Excell Seltzer. Annabell Howells, M.D.  ANESTHESIA:  General.  SPECIMEN:  Stone fragments.  DRAINS:  A 6-French 26-cm double-J stent with string.  BLOOD LOSS:  Minimal.  COMPLICATIONS:  None.  INDICATIONS:  Chad Gomez is a 67 year old white male with history of stones who presented after a prolonged absence with stone symptoms.  KUB demonstrated 2 large calculi on the right side of the bladder area one 13 mm and one 18 mm and there was a 3.4 cm calculus in the left upper quadrant that had grown significantly since his last visit.  He has had a prior bladder stone removed.  A CT scan confirmed the presence of a distal ureteral stone on the right with significant obstruction of renal atrophy and an 18-mm stone was in the bladder.  The left upper pole stone is partially obstructing the upper pole.  It was felt that cystoscopy with the cystolitholapaxy and ureteroscopic stone extraction with holmium laser were indicated.  The left upper pole stone will be dealt at a separate date.  FINDINGS OF THE PROCEDURE:  He was given Cipro.  He was taken to the operating room where general anesthetic was induced.  He was placed in lithotomy position and fitted with PAS hose.  A B and O suppository was placed.  He was prepped with Betadine solution  and draped in usual sterile fashion.  Cystoscopy was performed using a 22-French scope and 12 and 70 degree lenses.  Examination revealed a normal urethra.  The external sphincter was intact.  The prostatic urethra was 3-4 cm in length with trilobar hyperplasia and a very prominent middle lobe.  Inspection of the bladder revealed mild trabeculation.  No mucosal lesions were noted.  An 1.8 cm stone was noted in the base of the bladder.  Ureteral orifices were somewhat obscured by the middle lobe, I was eventually able to identify and they were unremarkable.  The right ureteral orifice was then cannulated with a sensor guidewire, however, this would not pass to the level of the stone which was just a centimeter to above the meatus.  I then passed an open-end catheter over the wire to stiffen the wire but was still unable to pass the Sensor wire, a similar template Glidewire failed.  At this point, I removed the cystoscope and inserted a 6.4-French rigid ureteroscope.  I was able to negotiate this into the ureteral orifice on the right, and visualize the stone.  There was some edema around the stone but had a good face  to treat.  A 365 micron laser fiber was then passed through the scope.  The holmium laser was set on 0.5 watts and 20 hertz and the stone was then fragmented into manageable fragments.  Once adequate fragmentation had been performed, a Nitinol basket was used to remove all but the smallest of the fragments from the ureter.  At this point, the ureteroscope was backed out into the bladder and the laser was then used through the ureteroscope to fragment the bladder stone.  This was done at a setting of 1 watts and 10 hertz.  The stone was fragmented into several manageable pieces.  The cystoscope was then reinserted.  The stone fragments were then evacuated from the bladder just using hydrostatic pressure.  Once the stone fragments had been removed, the ureteroscope was  reinserted and was used to cannulate the right ureteral orifice.  A Sensor wire was placed through the ureteroscope up into the proximal ureter.  The ureteroscope was then removed and the cystoscope was inserted over the wire because I was not certain of the position of the wire.  An opening catheter was placed over the wire in the proximal ureter.  The wire was removed.  Contrast was instilled.  This revealed some significant tortuosity of the proximal ureter with hydronephrosis.  With the image available, I was then able to negotiate the guidewire through the open-end catheter to the renal pelvis with less concern for ureteral injury.  Once the wire was in good position, an open-end catheter was removed and a 6-French 26-cm double-J stent with string was inserted over the wire to the kidney.  The wire was removed leaving good coil in the kidney and a good coil in the bladder.  The bladder was drained.  The cystoscope was removed.  The patient was taken down from lithotomy position after the drapes removed and his anesthetic was reversed.  He was moved to recovery room in stable condition.  There were no complications.  The stent string had been secured to his penis with pink tape.  There were no complications.     Excell Seltzer. Annabell Howells, M.D.     JJW/MEDQ  D:  12/02/2012  T:  12/03/2012  Job:  [id number]

## 2012-12-06 ENCOUNTER — Other Ambulatory Visit: Payer: Self-pay | Admitting: Urology

## 2012-12-06 DIAGNOSIS — N200 Calculus of kidney: Secondary | ICD-10-CM

## 2012-12-07 ENCOUNTER — Other Ambulatory Visit: Payer: Self-pay | Admitting: Radiology

## 2013-01-18 ENCOUNTER — Encounter (HOSPITAL_COMMUNITY): Payer: Self-pay | Admitting: Pharmacy Technician

## 2013-01-20 ENCOUNTER — Inpatient Hospital Stay (HOSPITAL_COMMUNITY): Admission: RE | Admit: 2013-01-20 | Payer: BC Managed Care – PPO | Source: Ambulatory Visit

## 2013-01-25 ENCOUNTER — Other Ambulatory Visit: Payer: Self-pay | Admitting: Radiology

## 2013-01-25 NOTE — Patient Instructions (Signed)
[name]  01/25/2013   Your procedure is scheduled on:  01/27/13               Procedure at 1200noon-230pm  Report to [facility] at     0930  AM.  Call this number if you have problems the morning of surgery: [phone]   Remember:   Do not eat food or drink liquids after midnight.   Take these medicines the morning of surgery with A SIP OF WATER:    Do not wear jewelry,   Do not wear lotions, powders, or perfumes. .   Men may shave face and neck.  Do not bring valuables to the hospital.  Contacts, dentures or bridgework may not be worn into surgery.  Leave suitcase in the car. After surgery it may be brought to your room.  For patients admitted to the hospital, checkout time is 11:00 AM the day of  discharge.       SEE CHG INSTRUCTION SHEET    Please read over the following fact sheets that you were given: , coughing and deep breathing exercises, leg exercises               Failure to comply with these instructions may result in cancellation of your surgery.                Patient Signature ____________________________              Nurse Signature _____________________________

## 2013-01-26 ENCOUNTER — Encounter (HOSPITAL_COMMUNITY): Payer: Self-pay

## 2013-01-26 ENCOUNTER — Ambulatory Visit (HOSPITAL_COMMUNITY)
Admission: RE | Admit: 2013-01-26 | Discharge: 2013-01-26 | Disposition: A | Discharge status: Home / Self Care | Payer: BC Managed Care – PPO | Source: Ambulatory Visit | Attending: Urology | Admitting: Urology

## 2013-01-26 ENCOUNTER — Encounter (HOSPITAL_COMMUNITY)
Admission: RE | Admit: 2013-01-26 | Discharge: 2013-01-26 | Disposition: A | Discharge status: Home / Self Care | Payer: BC Managed Care – PPO | Source: Ambulatory Visit | Attending: Urology | Admitting: Urology

## 2013-01-26 LAB — BASIC METABOLIC PANEL
Calcium: 10.2 mg/dL (ref 8.4–10.5)
GFR calc non Af Amer: 87 mL/min — ABNORMAL LOW (ref >90)
Potassium: 4.1 mEq/L (ref 3.5–5.1)
Sodium: 137 mEq/L (ref 135–145)

## 2013-01-26 LAB — APTT: aPTT: 28 seconds (ref 24–37)

## 2013-01-26 LAB — CBC WITH DIFFERENTIAL/PLATELET
Hemoglobin: 15.8 g/dL (ref 13.0–17.0)
Lymphocytes Relative: 22 % (ref 12–46)
Lymphs Abs: 1.5 10*3/uL (ref 0.7–4.0)
Monocytes Relative: 9 % (ref 3–12)
Neutro Abs: 4.7 10*3/uL (ref 1.7–7.7)
Neutrophils Relative %: 66 % (ref 43–77)
Platelets: 218 10*3/uL (ref 150–400)
RBC: 5.2 MIL/uL (ref 4.22–5.81)
WBC: 7.1 10*3/uL (ref 4.0–10.5)

## 2013-01-26 LAB — PROTIME-INR
INR: 1.08 (ref 0.00–1.49)
Prothrombin Time: 13.8 seconds (ref 11.6–15.2)

## 2013-01-26 NOTE — H&P (Signed)
ctive Problems Problems  1. Bladder Calculus 594.1 2. Microscopic Hematuria 599.72 3. Nephrolithiasis Of Both Kidneys 592.0 4. Prostate Hard Area Or Nodule 600.10 5. PSA,Elevated 790.93 6. Pyuria 791.9  History of Present Illness           Chad Gomez is a former patient who I last saw in 2007 with an elevated PSA and bladder stone.  He is sent back in consultation by Dr. Lerry Liner for a further PSA elevation.   He had a negative biopsy then and the bladder stone was removed.  He had bilateral renal stones at that time.  He had a history of PSA elevation then and a prior negative biopsy in 2004 in [place] and 2 negative biopsies prior to that in [place].  His PSA in 2007 was 6.73.  There was an interim PSA that was 4.1 and last May it was up to 7.73.  He had possible orchitis then and was given Rocephin and doxycycline.  He was given Levaquin for 7 days in 2/14 for bronchitis and a repeat PSA was ordered but I don't have those results yet.   He has no significant voiding complaints.  He has had no gross hematuria.  His UA today has 7-10 WBC and 3-6 RBC's.   He has had 3 prior stones and required ESWL.  He had been on prednisone and calcium at that time which the patient thinks caused the stones.   Past Medical History Problems  1. History of  Alopecia Areata 704.01 2. History of  Arthritis V13.4 3. History of  Hypertension 401.9 4. History of  Nephrolithiasis V13.01  Surgical History Problems  1. History of  Biopsy Of The Prostate Needle 2. History of  Cystoscopy With Fragmentation Of Bladder Calculus 3. History of  Inguinal Hernia Repair Right 4. History of  Lithotripsy  Current Meds 1. Azor 10-40 MG Oral Tablet; Therapy: (Recorded:28May2014) to 2. Flonase 50 MCG/ACT Nasal Suspension; Therapy: (Recorded:28May2014) to 3. Gabapentin 100 MG Oral Capsule; Therapy: (Recorded:28May2014) to 4. Hydrocodone-Acetaminophen 7.5-325 MG Oral Tablet; Therapy: (Recorded:28May2014) to 5. Iron TABS;  Therapy: (Recorded:28May2014) to 6. Meloxicam 7.5 MG Oral Tablet; Therapy: (Recorded:28May2014) to 7. Niacin 500 MG Oral Tablet; Therapy: (Recorded:28May2014) to  Allergies Medication  1. Amoxicillin TABS 2. Naproxen TABS  Family History Problems  1. Family history of  Death In The Family Father 2. Family history of  Death In The Family Mother 3. Family history of  Family Health Status Number Of Children 4. Sororal history of  Hypertension V17.49  Social History Problems    Marital History - Currently Married   Occupation:   Tobacco Use 305.1 Denied    History of  Alcohol Use   History of  Caffeine Use  Review of Systems Genitourinary, constitutional, skin, eye, otolaryngeal, hematologic/lymphatic, cardiovascular, pulmonary, endocrine, musculoskeletal, gastrointestinal, neurological and psychiatric system(s) were reviewed and pertinent findings if present are noted.  Genitourinary: urinary frequency, nocturia and erectile dysfunction.  Constitutional: feeling tired (fatigue).  ENT: sinus problems.  Respiratory: cough.  Musculoskeletal: joint pain.    Vitals Vital Signs [Data Includes: Last 1 Day]  28May2014 09:59AM  Blood Pressure: 145 / 89 Temperature: 98.3 F Heart Rate: 86 28May2014 09:45AM  BMI Calculated: 26.49 BSA Calculated: 2.02 Height: 5 ft 10 in Weight: 185 lb   Physical Exam Constitutional: Well nourished and well developed . No acute distress.  ENT:. The ears and nose are normal in appearance.  Neck: The appearance of the neck is normal and no neck mass is  present.  Pulmonary: No respiratory distress and normal respiratory rhythm and effort.  Cardiovascular: Heart rate and rhythm are normal . No peripheral edema.  Abdomen: The abdomen is rounded. The abdomen is soft and nontender. No masses are palpated. No CVA tenderness. No hernias are palpable. No hepatosplenomegaly noted.  Rectal: Rectal exam demonstrates normal sphincter tone, no tenderness and  no masses. Estimated prostate size is 3+. The prostate has no nodularity, is indurated (right slightly more prominent than the left. ) and is not tender. The left seminal vesicle is nonpalpable. The right seminal vesicle is nonpalpable. The perineum is normal on inspection.  Genitourinary: Examination of the penis demonstrates no discharge, no masses, no lesions and a normal meatus. The scrotum is without lesions. The right epididymis is palpably normal and non-tender. The left epididymis is palpably normal and non-tender. The right testis is non-tender and without masses. The left testis is non-tender and without masses.  Lymphatics: The femoral and inguinal nodes are not enlarged or tender.  Skin: Normal skin turgor, no visible rash and no visible skin lesions.  Neuro/Psych:. Mood and affect are appropriate. Normal sensation of the perineum/perianal region (S3,4,5).    Results/Data Urine [Data Includes: Last 1 Day]   28May2014  COLOR YELLOW   APPEARANCE CLEAR   SPECIFIC GRAVITY 1.010   pH 6.0   GLUCOSE NEG mg/dL  BILIRUBIN NEG   KETONE NEG mg/dL  BLOOD MOD   PROTEIN NEG mg/dL  UROBILINOGEN 0.2 mg/dL  NITRITE NEG   LEUKOCYTE ESTERASE SMALL   SQUAMOUS EPITHELIAL/HPF RARE   WBC 7-10 WBC/hpf  RBC 3-6 RBC/hpf  BACTERIA RARE   CRYSTALS NONE SEEN   CASTS NONE SEEN    Old records or history reviewed: Outside records and labs from Dr. Mayford Knife were reviewed. I have reviewed our prior office notes, labs and x-rays.  The following images/tracing/specimen were independently visualized:  KUB today shows a partial staghorn in the left upper pole. This has grown since 2007 and is now 38 x 15mm. there is a stable 1cm RLP stone. There is a 12mm cluster of stones that could be at the right UVJ or in the bladder. There is a 13mm stone that appears to be in the bladder along with a 4mm stone. He has several calcified gallstones. No other abnormalities are noted.  Will request old records/history: I have  requested additional PSA results if they are available from Dr. Mayford Knife.    Assessment  He has a history of an elevated PSA with 3 prior negative biopsies with the last in 2007.   His PSA was 7.73 in 5/13 which is up only 1 point since 2007.  He may have a more recent result but I don't have that.  He has urolithiasis with interval growth and now has a 3.8cm left partial staghorn stones along with multiple bladder or possibly right distal ureteral stones.  He is remarkably assymptomatic, but he does have some prostate assymetry, right > left.   Plan  Health Maintenance (V70.0)  1. UA With REFLEX  Done: 28May2014 09:35AM Microscopic Hematuria (599.72)  2. BUN & CREATININE  Done: 28May2014 3. Follow-up Pending Results Office  Follow-up  Requested for: 28May2014 Microscopic Hematuria (599.72), Nephrolithiasis Of Both Kidneys (592.0)  4. AU CT-HEMATURIA PROTOCOL  Requested for: 28May2014 Nephrolithiasis Of Both Kidneys (592.0)  5. KUB  Done: 28May2014 12:00AM PSA,Elevated (790.93)  6. PSA REFLEX TO FREE  Done: 28May2014   Urine culture today. PSA and BUN/Cr today. I will get additional  PSA results from Dr. Mayford Knife if available.  He will return for a CT hematuria study to better clarify his anatomy and I will discuss those results prior to arranging further treatment and evaluation.

## 2013-01-27 ENCOUNTER — Ambulatory Visit (HOSPITAL_COMMUNITY)
Admission: RE | Admit: 2013-01-27 | Discharge: 2013-01-27 | Disposition: A | Discharge status: Home / Self Care | Payer: BC Managed Care – PPO | Source: Ambulatory Visit | Attending: Urology | Admitting: Urology

## 2013-01-27 ENCOUNTER — Ambulatory Visit (HOSPITAL_COMMUNITY): Payer: BC Managed Care – PPO | Admitting: Certified Registered Nurse Anesthetist

## 2013-01-27 ENCOUNTER — Observation Stay (HOSPITAL_COMMUNITY)
Admission: RE | Admit: 2013-01-27 | Discharge: 2013-01-28 | Disposition: A | Discharge status: Home / Self Care | Payer: BC Managed Care – PPO | Source: Ambulatory Visit | Attending: Urology | Admitting: Urology

## 2013-01-27 ENCOUNTER — Encounter (HOSPITAL_COMMUNITY): Payer: Self-pay | Admitting: Certified Registered Nurse Anesthetist

## 2013-01-27 ENCOUNTER — Encounter (HOSPITAL_COMMUNITY)
Admission: RE | Disposition: A | Discharge status: Home / Self Care | Payer: Self-pay | Source: Ambulatory Visit | Attending: Urology

## 2013-01-27 ENCOUNTER — Encounter (HOSPITAL_COMMUNITY): Payer: Self-pay | Admitting: *Deleted

## 2013-01-27 ENCOUNTER — Ambulatory Visit (HOSPITAL_COMMUNITY): Payer: BC Managed Care – PPO

## 2013-01-27 ENCOUNTER — Ambulatory Visit (HOSPITAL_COMMUNITY): Admission: RE | Admit: 2013-01-27 | Payer: BC Managed Care – PPO | Source: Ambulatory Visit | Admitting: Urology

## 2013-01-27 VITALS — BP 136/79 | HR 84 | Temp 98.7°F | Resp 17

## 2013-01-27 DIAGNOSIS — I10 Essential (primary) hypertension: Secondary | ICD-10-CM | POA: Insufficient documentation

## 2013-01-27 DIAGNOSIS — Z01818 Encounter for other preprocedural examination: Secondary | ICD-10-CM | POA: Insufficient documentation

## 2013-01-27 DIAGNOSIS — F17200 Nicotine dependence, unspecified, uncomplicated: Secondary | ICD-10-CM | POA: Insufficient documentation

## 2013-01-27 DIAGNOSIS — Z79899 Other long term (current) drug therapy: Secondary | ICD-10-CM | POA: Insufficient documentation

## 2013-01-27 DIAGNOSIS — N200 Calculus of kidney: Principal | ICD-10-CM | POA: Insufficient documentation

## 2013-01-27 DIAGNOSIS — Z85820 Personal history of malignant melanoma of skin: Secondary | ICD-10-CM | POA: Insufficient documentation

## 2013-01-27 DIAGNOSIS — Z01812 Encounter for preprocedural laboratory examination: Secondary | ICD-10-CM | POA: Insufficient documentation

## 2013-01-27 HISTORY — PX: NEPHROLITHOTOMY: SHX5134

## 2013-01-27 SURGERY — NEPHROLITHOTOMY PERCUTANEOUS
Anesthesia: General | Laterality: Left | Wound class: Clean Contaminated

## 2013-01-27 MED ORDER — BISACODYL 10 MG RE SUPP
10.0000 mg | Freq: Every day | RECTAL | Status: DC | PRN
  Filled 2013-01-27: qty 1

## 2013-01-27 MED ORDER — IOHEXOL 300 MG/ML  SOLN
INTRAMUSCULAR | Status: AC | PRN
  Administered 2013-01-27: 10 mg via INTRAVENOUS

## 2013-01-27 MED ORDER — KCL IN DEXTROSE-NACL 20-5-0.45 MEQ/L-%-% IV SOLN
INTRAVENOUS | Status: DC
  Administered 2013-01-27 (×2): via INTRAVENOUS
  Filled 2013-01-27 (×3): qty 1000

## 2013-01-27 MED ORDER — FENTANYL CITRATE 0.05 MG/ML IJ SOLN
INTRAMUSCULAR | Status: AC
  Administered 2013-01-27 (×3): 50 ug via INTRAVENOUS
  Filled 2013-01-27: qty 6

## 2013-01-27 MED ORDER — DOCUSATE SODIUM 100 MG PO CAPS
100.0000 mg | ORAL_CAPSULE | Freq: Two times a day (BID) | ORAL | Status: DC
  Administered 2013-01-27 (×2): 100 mg via ORAL
  Filled 2013-01-27 (×4): qty 1

## 2013-01-27 MED ORDER — MIDAZOLAM HCL 2 MG/2ML IJ SOLN
INTRAMUSCULAR | Status: AC | PRN
  Administered 2013-01-27 (×2): 1 mg via INTRAVENOUS

## 2013-01-27 MED ORDER — HYDROMORPHONE HCL PF 1 MG/ML IJ SOLN: 0.2500 mg | INTRAMUSCULAR | Status: DC | PRN

## 2013-01-27 MED ORDER — CIPROFLOXACIN IN D5W 400 MG/200ML IV SOLN
400.0000 mg | INTRAVENOUS | Status: AC
  Administered 2013-01-27: 400 mg via INTRAVENOUS

## 2013-01-27 MED ORDER — IOHEXOL 300 MG/ML  SOLN: 10.0000 mL | Freq: Once | INTRAMUSCULAR | Status: DC | PRN

## 2013-01-27 MED ORDER — IOHEXOL 300 MG/ML  SOLN
INTRAMUSCULAR | Status: AC
  Filled 2013-01-27: qty 1

## 2013-01-27 MED ORDER — DIPHENHYDRAMINE HCL 50 MG/ML IJ SOLN: 12.5000 mg | Freq: Four times a day (QID) | INTRAMUSCULAR | Status: DC | PRN

## 2013-01-27 MED ORDER — GLYCOPYRROLATE 0.2 MG/ML IJ SOLN
INTRAMUSCULAR | Status: DC | PRN
  Administered 2013-01-27: .5 mg via INTRAVENOUS

## 2013-01-27 MED ORDER — LIDOCAINE HCL (CARDIAC) 20 MG/ML IV SOLN
INTRAVENOUS | Status: DC | PRN
  Administered 2013-01-27: 50 mg via INTRAVENOUS

## 2013-01-27 MED ORDER — ACETAMINOPHEN 325 MG PO TABS: 650.0000 mg | ORAL_TABLET | ORAL | Status: DC | PRN

## 2013-01-27 MED ORDER — CIPROFLOXACIN IN D5W 400 MG/200ML IV SOLN
INTRAVENOUS | Status: AC
  Filled 2013-01-27: qty 200

## 2013-01-27 MED ORDER — SUCCINYLCHOLINE CHLORIDE 20 MG/ML IJ SOLN
INTRAMUSCULAR | Status: DC | PRN
  Administered 2013-01-27: 100 mg via INTRAVENOUS

## 2013-01-27 MED ORDER — HYOSCYAMINE SULFATE 0.125 MG SL SUBL
0.1250 mg | SUBLINGUAL_TABLET | SUBLINGUAL | Status: DC | PRN
  Filled 2013-01-27: qty 1

## 2013-01-27 MED ORDER — PROMETHAZINE HCL 25 MG/ML IJ SOLN: 6.2500 mg | INTRAMUSCULAR | Status: DC | PRN

## 2013-01-27 MED ORDER — PHENYLEPHRINE HCL 10 MG/ML IJ SOLN
INTRAMUSCULAR | Status: DC | PRN
  Administered 2013-01-27: 80 ug via INTRAVENOUS
  Administered 2013-01-27: 60 ug via INTRAVENOUS

## 2013-01-27 MED ORDER — MIDAZOLAM HCL 2 MG/2ML IJ SOLN
INTRAMUSCULAR | Status: AC
  Administered 2013-01-27: 2 mg via INTRAVENOUS
  Filled 2013-01-27: qty 6

## 2013-01-27 MED ORDER — SODIUM CHLORIDE 0.9 % IR SOLN
Status: DC | PRN
  Administered 2013-01-27 (×2): 3000 mL

## 2013-01-27 MED ORDER — HYDROMORPHONE HCL PF 1 MG/ML IJ SOLN: 0.5000 mg | INTRAMUSCULAR | Status: DC | PRN

## 2013-01-27 MED ORDER — PROPOFOL 10 MG/ML IV BOLUS
INTRAVENOUS | Status: DC | PRN
  Administered 2013-01-27: 150 mg via INTRAVENOUS

## 2013-01-27 MED ORDER — OXYCODONE HCL 5 MG PO TABS
5.0000 mg | ORAL_TABLET | ORAL | Status: DC | PRN
  Administered 2013-01-27 – 2013-01-28 (×2): 5 mg via ORAL
  Filled 2013-01-27 (×2): qty 1

## 2013-01-27 MED ORDER — ZOLPIDEM TARTRATE 5 MG PO TABS: 5.0000 mg | ORAL_TABLET | Freq: Every evening | ORAL | Status: DC | PRN

## 2013-01-27 MED ORDER — ONDANSETRON HCL 4 MG/2ML IJ SOLN: 4.0000 mg | INTRAMUSCULAR | Status: DC | PRN

## 2013-01-27 MED ORDER — CIPROFLOXACIN IN D5W 400 MG/200ML IV SOLN: 400.0000 mg | INTRAVENOUS | Status: DC

## 2013-01-27 MED ORDER — FENTANYL CITRATE 0.05 MG/ML IJ SOLN
INTRAMUSCULAR | Status: AC | PRN
  Administered 2013-01-27: 100 ug via INTRAVENOUS

## 2013-01-27 MED ORDER — ONDANSETRON HCL 4 MG/2ML IJ SOLN
INTRAMUSCULAR | Status: DC | PRN
  Administered 2013-01-27: 4 mg via INTRAVENOUS

## 2013-01-27 MED ORDER — LACTATED RINGERS IV SOLN
INTRAVENOUS | Status: DC
  Administered 2013-01-27 (×2): via INTRAVENOUS
  Administered 2013-01-27: 1000 mL via INTRAVENOUS

## 2013-01-27 MED ORDER — EPHEDRINE SULFATE 50 MG/ML IJ SOLN
INTRAMUSCULAR | Status: DC | PRN
  Administered 2013-01-27: 10 mg via INTRAVENOUS

## 2013-01-27 MED ORDER — ACETAMINOPHEN 10 MG/ML IV SOLN
1000.0000 mg | Freq: Once | INTRAVENOUS | Status: AC
  Administered 2013-01-27: 1000 mg via INTRAVENOUS
  Filled 2013-01-27: qty 100

## 2013-01-27 MED ORDER — SODIUM CHLORIDE 0.9 % IV SOLN
INTRAVENOUS | Status: DC
  Administered 2013-01-27: 1000 mL via INTRAVENOUS

## 2013-01-27 MED ORDER — CIPROFLOXACIN HCL 500 MG PO TABS
500.0000 mg | ORAL_TABLET | Freq: Two times a day (BID) | ORAL | Status: DC
  Administered 2013-01-27 – 2013-01-28 (×2): 500 mg via ORAL
  Filled 2013-01-27 (×4): qty 1

## 2013-01-27 MED ORDER — NEOSTIGMINE METHYLSULFATE 1 MG/ML IJ SOLN
INTRAMUSCULAR | Status: DC | PRN
  Administered 2013-01-27: 3.5 mg via INTRAVENOUS

## 2013-01-27 MED ORDER — IOHEXOL 300 MG/ML  SOLN
INTRAMUSCULAR | Status: DC | PRN
  Administered 2013-01-27: 7 mL

## 2013-01-27 MED ORDER — ROCURONIUM BROMIDE 100 MG/10ML IV SOLN
INTRAVENOUS | Status: DC | PRN
  Administered 2013-01-27: 25 mg via INTRAVENOUS

## 2013-01-27 MED ORDER — DIPHENHYDRAMINE HCL 12.5 MG/5ML PO ELIX
12.5000 mg | ORAL_SOLUTION | Freq: Four times a day (QID) | ORAL | Status: DC | PRN
  Filled 2013-01-27: qty 5

## 2013-01-27 SURGICAL SUPPLY — 52 items
APL SKNCLS STERI-STRIP NONHPOA (GAUZE/BANDAGES/DRESSINGS) ×1
BAG URINE DRAINAGE (UROLOGICAL SUPPLIES) ×2 IMPLANT
BAG URO CATCHER STRL LF (DRAPE) IMPLANT
BASKET ZERO TIP NITINOL 2.4FR (BASKET) ×2 IMPLANT
BENZOIN TINCTURE PRP APPL 2/3 (GAUZE/BANDAGES/DRESSINGS) ×5 IMPLANT
BLADE SURG 15 STRL LF DISP TIS (BLADE) ×1 IMPLANT
BLADE SURG 15 STRL SS (BLADE) ×2
BSKT STON RTRVL ZERO TP 2.4FR (BASKET) ×1
CARTRIDGE STONEBREAK CO2 KIDNE (ELECTROSURGICAL) ×2 IMPLANT
CATCHER STONE W/TUBE ADAPTER (UROLOGICAL SUPPLIES) ×1 IMPLANT
CATH AINSWORTH 30CC 24FR (CATHETERS) ×1 IMPLANT
CATH BEACON 5.038 65CM KMP-01 (CATHETERS) ×1 IMPLANT
CATH FOLEY 2W COUNCIL 20FR 5CC (CATHETERS) IMPLANT
CATH ROBINSON RED A/P 20FR (CATHETERS) IMPLANT
CATH URET 5FR 28IN OPEN ENDED (CATHETERS) ×1 IMPLANT
CATH X-FORCE N30 NEPHROSTOMY (TUBING) ×2 IMPLANT
CLOTH BEACON ORANGE TIMEOUT ST (SAFETY) ×2 IMPLANT
COVER SURGICAL LIGHT HANDLE (MISCELLANEOUS) ×2 IMPLANT
DRAPE C-ARM 42X120 X-RAY (DRAPES) ×2 IMPLANT
DRAPE CAMERA CLOSED 9X96 (DRAPES) ×2 IMPLANT
DRAPE LINGEMAN PERC (DRAPES) ×2 IMPLANT
DRAPE SURG IRRIG POUCH 19X23 (DRAPES) ×2 IMPLANT
DRSG TEGADERM 8X12 (GAUZE/BANDAGES/DRESSINGS) ×3 IMPLANT
GLOVE SURG SS PI 8.0 STRL IVOR (GLOVE) ×2 IMPLANT
GOWN PREVENTION PLUS XLARGE (GOWN DISPOSABLE) ×2 IMPLANT
GOWN STRL REIN XL XLG (GOWN DISPOSABLE) ×2 IMPLANT
GUIDEWIRE SUPER STIFF (WIRE) ×4 IMPLANT
KIT BASIN OR (CUSTOM PROCEDURE TRAY) ×2 IMPLANT
LASER FIBER DISP (UROLOGICAL SUPPLIES) IMPLANT
LASER FIBER DISP 1000U (UROLOGICAL SUPPLIES) IMPLANT
MANIFOLD NEPTUNE II (INSTRUMENTS) ×2 IMPLANT
MARKER SKIN DUAL TIP RULER LAB (MISCELLANEOUS) ×2 IMPLANT
NS IRRIG 1000ML POUR BTL (IV SOLUTION) ×2 IMPLANT
PACK BASIC VI WITH GOWN DISP (CUSTOM PROCEDURE TRAY) ×2 IMPLANT
PACK CYSTO (CUSTOM PROCEDURE TRAY) ×1 IMPLANT
PAD ABD 7.5X8 STRL (GAUZE/BANDAGES/DRESSINGS) ×3 IMPLANT
PROBE KIDNEY STONEBRKR 2.0X425 (ELECTROSURGICAL) ×1 IMPLANT
PROBE LITHOCLAST ULTRA 3.8X403 (UROLOGICAL SUPPLIES) ×1 IMPLANT
PROBE PNEUMATIC 1.0MMX570MM (UROLOGICAL SUPPLIES) ×1 IMPLANT
SET IRRIG Y TYPE TUR BLADDER L (SET/KITS/TRAYS/PACK) ×2 IMPLANT
SET WARMING FLUID IRRIGATION (MISCELLANEOUS) ×1 IMPLANT
SHEATH PEELAWAY SET 9 (SHEATH) ×1 IMPLANT
SPONGE GAUZE 4X4 12PLY (GAUZE/BANDAGES/DRESSINGS) ×2 IMPLANT
SPONGE LAP 4X18 X RAY DECT (DISPOSABLE) ×2 IMPLANT
STONE CATCHER W/TUBE ADAPTER (UROLOGICAL SUPPLIES) IMPLANT
SUT SILK 2 0 30  PSL (SUTURE) ×1
SUT SILK 2 0 30 PSL (SUTURE) ×1 IMPLANT
SYR 20CC LL (SYRINGE) ×4 IMPLANT
SYRINGE 10CC LL (SYRINGE) ×2 IMPLANT
TOWEL OR NON WOVEN STRL DISP B (DISPOSABLE) ×2 IMPLANT
TRAY FOLEY CATH 14FRSI W/METER (CATHETERS) ×2 IMPLANT
TUBING CONNECTING 10 (TUBING) ×6 IMPLANT

## 2013-01-27 NOTE — Procedures (Signed)
Successful placement of left sided 5 Fr catheter for PNL access.  Tip of catheter is within the urinary bladder.  No immediate complications.    

## 2013-01-27 NOTE — Transfer of Care (Signed)
Immediate Anesthesia Transfer of Care Note  Patient: Chad Gomez  Procedure(s) Performed: Procedure(s): NEPHROLITHOTOMY PERCUTANEOUS FIRST LOOK (Left)  Patient Location: PACU  Anesthesia Type:General  Level of Consciousness: sedated  Airway & Oxygen Therapy: Patient Spontanous Breathing and Patient connected to face mask oxygen  Post-op Assessment: Report given to PACU RN and Post -op Vital signs reviewed and stable  Post vital signs: Reviewed and stable  Complications: No apparent anesthesia complications

## 2013-01-27 NOTE — Anesthesia Preprocedure Evaluation (Addendum)
Anesthesia Evaluation  Patient identified by MRN, date of birth, ID band Patient awake  General Assessment Comment:.  Hypertension     .  Iron deficiency     .  History of bladder stone     .  Bladder stone     .  Right ureteral stone     .  Enlarged prostate     .  Frequency of urination     .  Nocturia     .  Melanoma         benign removed from back of left ear    .  Right arm fracture     Reviewed: Allergy & Precautions, H&P , NPO status , Patient's Chart, lab work & pertinent test results  Airway Mallampati: II TM Distance: >3 FB Neck ROM: Full    Dental  (+) Edentulous Upper and Edentulous Lower   Pulmonary neg pulmonary ROS,  breath sounds clear to auscultation  Pulmonary exam normal       Cardiovascular Exercise Tolerance: Good hypertension, Pt. on medications negative cardio ROS  Rhythm:Regular Rate:Normal     Neuro/Psych negative neurological ROS  negative psych ROS   GI/Hepatic negative GI ROS, Neg liver ROS,   Endo/Other  negative endocrine ROS  Renal/GU negative Renal ROS  negative genitourinary   Musculoskeletal negative musculoskeletal ROS (+)   Abdominal   Peds negative pediatric ROS (+)  Hematology negative hematology ROS (+)   Anesthesia Other Findings   Reproductive/Obstetrics negative OB ROS                          Anesthesia Physical Anesthesia Plan  ASA: II  Anesthesia Plan: General   Post-op Pain Management:    Induction: Intravenous  Airway Management Planned: Oral ETT  Additional Equipment:   Intra-op Plan:   Post-operative Plan: Extubation in OR  Informed Consent: I have reviewed the patients History and Physical, chart, labs and discussed the procedure including the risks, benefits and alternatives for the proposed anesthesia with the patient or authorized representative who has indicated his/her understanding and acceptance.   Dental  advisory given  Plan Discussed with: CRNA  Anesthesia Plan Comments:         Anesthesia Quick Evaluation

## 2013-01-27 NOTE — H&P (View-Only) (Signed)
ctive Problems Problems  1. Bladder Calculus 594.1 2. Microscopic Hematuria 599.72 3. Nephrolithiasis Of Both Kidneys 592.0 4. Prostate Hard Area Or Nodule 600.10 5. PSA,Elevated 790.93 6. Pyuria 791.9  History of Present Illness           Chad Gomez is a former patient who I last saw in 2007 with an elevated PSA and bladder stone.  He is sent back in consultation by Dr. Dwight Williams for a further PSA elevation.   He had a negative biopsy then and the bladder stone was removed.  He had bilateral renal stones at that time.  He had a history of PSA elevation then and a prior negative biopsy in 2004 in [place] and 2 negative biopsies prior to that in [place].  His PSA in 2007 was 6.73.  There was an interim PSA that was 4.1 and last May it was up to 7.73.  He had possible orchitis then and was given Rocephin and doxycycline.  He was given Levaquin for 7 days in 2/14 for bronchitis and a repeat PSA was ordered but I don't have those results yet.   He has no significant voiding complaints.  He has had no gross hematuria.  His UA today has 7-10 WBC and 3-6 RBC's.   He has had 3 prior stones and required ESWL.  He had been on prednisone and calcium at that time which the patient thinks caused the stones.   Past Medical History Problems  1. History of  Alopecia Areata 704.01 2. History of  Arthritis V13.4 3. History of  Hypertension 401.9 4. History of  Nephrolithiasis V13.01  Surgical History Problems  1. History of  Biopsy Of The Prostate Needle 2. History of  Cystoscopy With Fragmentation Of Bladder Calculus 3. History of  Inguinal Hernia Repair Right 4. History of  Lithotripsy  Current Meds 1. Azor 10-40 MG Oral Tablet; Therapy: (Recorded:28May2014) to 2. Flonase 50 MCG/ACT Nasal Suspension; Therapy: (Recorded:28May2014) to 3. Gabapentin 100 MG Oral Capsule; Therapy: (Recorded:28May2014) to 4. Hydrocodone-Acetaminophen 7.5-325 MG Oral Tablet; Therapy: (Recorded:28May2014) to 5. Iron TABS;  Therapy: (Recorded:28May2014) to 6. Meloxicam 7.5 MG Oral Tablet; Therapy: (Recorded:28May2014) to 7. Niacin 500 MG Oral Tablet; Therapy: (Recorded:28May2014) to  Allergies Medication  1. Amoxicillin TABS 2. Naproxen TABS  Family History Problems  1. Family history of  Death In The Family Father 2. Family history of  Death In The Family Mother 3. Family history of  Family Health Status Number Of Children 4. Sororal history of  Hypertension V17.49  Social History Problems    Marital History - Currently Married   Occupation:   Tobacco Use 305.1 Denied    History of  Alcohol Use   History of  Caffeine Use  Review of Systems Genitourinary, constitutional, skin, eye, otolaryngeal, hematologic/lymphatic, cardiovascular, pulmonary, endocrine, musculoskeletal, gastrointestinal, neurological and psychiatric system(s) were reviewed and pertinent findings if present are noted.  Genitourinary: urinary frequency, nocturia and erectile dysfunction.  Constitutional: feeling tired (fatigue).  ENT: sinus problems.  Respiratory: cough.  Musculoskeletal: joint pain.    Vitals Vital Signs [Data Includes: Last 1 Day]  28May2014 09:59AM  Blood Pressure: 145 / 89 Temperature: 98.3 F Heart Rate: 86 28May2014 09:45AM  BMI Calculated: 26.49 BSA Calculated: 2.02 Height: 5 ft 10 in Weight: 185 lb   Physical Exam Constitutional: Well nourished and well developed . No acute distress.  ENT:. The ears and nose are normal in appearance.  Neck: The appearance of the neck is normal and no neck mass is   present.  Pulmonary: No respiratory distress and normal respiratory rhythm and effort.  Cardiovascular: Heart rate and rhythm are normal . No peripheral edema.  Abdomen: The abdomen is rounded. The abdomen is soft and nontender. No masses are palpated. No CVA tenderness. No hernias are palpable. No hepatosplenomegaly noted.  Rectal: Rectal exam demonstrates normal sphincter tone, no tenderness and  no masses. Estimated prostate size is 3+. The prostate has no nodularity, is indurated (right slightly more prominent than the left. ) and is not tender. The left seminal vesicle is nonpalpable. The right seminal vesicle is nonpalpable. The perineum is normal on inspection.  Genitourinary: Examination of the penis demonstrates no discharge, no masses, no lesions and a normal meatus. The scrotum is without lesions. The right epididymis is palpably normal and non-tender. The left epididymis is palpably normal and non-tender. The right testis is non-tender and without masses. The left testis is non-tender and without masses.  Lymphatics: The femoral and inguinal nodes are not enlarged or tender.  Skin: Normal skin turgor, no visible rash and no visible skin lesions.  Neuro/Psych:. Mood and affect are appropriate. Normal sensation of the perineum/perianal region (S3,4,5).    Results/Data Urine [Data Includes: Last 1 Day]   28May2014  COLOR YELLOW   APPEARANCE CLEAR   SPECIFIC GRAVITY 1.010   pH 6.0   GLUCOSE NEG mg/dL  BILIRUBIN NEG   KETONE NEG mg/dL  BLOOD MOD   PROTEIN NEG mg/dL  UROBILINOGEN 0.2 mg/dL  NITRITE NEG   LEUKOCYTE ESTERASE SMALL   SQUAMOUS EPITHELIAL/HPF RARE   WBC 7-10 WBC/hpf  RBC 3-6 RBC/hpf  BACTERIA RARE   CRYSTALS NONE SEEN   CASTS NONE SEEN    Old records or history reviewed: Outside records and labs from Dr. Williams were reviewed. I have reviewed our prior office notes, labs and x-rays.  The following images/tracing/specimen were independently visualized:  KUB today shows a partial staghorn in the left upper pole. This has grown since 2007 and is now 38 x 15mm. there is a stable 1cm RLP stone. There is a 12mm cluster of stones that could be at the right UVJ or in the bladder. There is a 13mm stone that appears to be in the bladder along with a 4mm stone. He has several calcified gallstones. No other abnormalities are noted.  Will request old records/history: I have  requested additional PSA results if they are available from Dr. Williams.    Assessment  He has a history of an elevated PSA with 3 prior negative biopsies with the last in 2007.   His PSA was 7.73 in 5/13 which is up only 1 point since 2007.  He may have a more recent result but I don't have that.  He has urolithiasis with interval growth and now has a 3.8cm left partial staghorn stones along with multiple bladder or possibly right distal ureteral stones.  He is remarkably assymptomatic, but he does have some prostate assymetry, right > left.   Plan  Health Maintenance (V70.0)  1. UA With REFLEX  Done: 28May2014 09:35AM Microscopic Hematuria (599.72)  2. BUN & CREATININE  Done: 28May2014 3. Follow-up Pending Results Office  Follow-up  Requested for: 28May2014 Microscopic Hematuria (599.72), Nephrolithiasis Of Both Kidneys (592.0)  4. AU CT-HEMATURIA PROTOCOL  Requested for: 28May2014 Nephrolithiasis Of Both Kidneys (592.0)  5. KUB  Done: 28May2014 12:00AM PSA,Elevated (790.93)  6. PSA REFLEX TO FREE  Done: 28May2014   Urine culture today. PSA and BUN/Cr today. I will get additional   PSA results from Dr. Williams if available.  He will return for a CT hematuria study to better clarify his anatomy and I will discuss those results prior to arranging further treatment and evaluation.  

## 2013-01-27 NOTE — Anesthesia Postprocedure Evaluation (Signed)
  Anesthesia Post-op Note  Patient: Chad Gomez  Procedure(s) Performed: Procedure(s) (LRB): NEPHROLITHOTOMY PERCUTANEOUS FIRST LOOK (Left)  Patient Location: PACU  Anesthesia Type: General  Level of Consciousness: awake and alert   Airway and Oxygen Therapy: Patient Spontanous Breathing  Post-op Pain: mild  Post-op Assessment: Post-op Vital signs reviewed, Patient's Cardiovascular Status Stable, Respiratory Function Stable, Patent Airway and No signs of Nausea or vomiting  Last Vitals:  Filed Vitals:   01/27/13 1730  BP: 144/89  Pulse: 68  Temp:   Resp:     Post-op Vital Signs: stable   Complications: No apparent anesthesia complications

## 2013-01-27 NOTE — Brief Op Note (Signed)
01/27/2013  1:37 PM  PATIENT:  Park Breed  67 y.o. male  PRE-OPERATIVE DIAGNOSIS:  LEFT RENAL STONES 3.4cm staghorn  POST-OPERATIVE DIAGNOSIS:  left renal stones 3.4cm staghorn  PROCEDURE:  Procedure(s): NEPHROLITHOTOMY PERCUTANEOUS >2cm  FIRST LOOK (Left)  SURGEON:  Surgeon(s) and Role:    * Anner Crete, MD - Primary  PHYSICIAN ASSISTANT:   ASSISTANTS: none   ANESTHESIA:   general  EBL:  Total I/O In: -  Out: 300 [Urine:300]  BLOOD ADMINISTERED:none  DRAINS: Urinary Catheter (Foley) and 53fr left nephrostomy tube and Kompy catheter   LOCAL MEDICATIONS USED:  NONE  SPECIMEN:  Source of Specimen:  left renal stones.   DISPOSITION OF SPECIMEN:  to family.   COUNTS:  YES  TOURNIQUET:  * No tourniquets in log *  DICTATION: .Other Dictation: Dictation Number [id number]  PLAN OF CARE: Admit for overnight observation  PATIENT DISPOSITION:  PACU - hemodynamically stable.   Delay start of Pharmacological VTE agent (>24hrs) due to surgical blood loss or risk of bleeding: yes

## 2013-01-27 NOTE — H&P (Signed)
Chad Gomez is an 67 y.o. male.   Chief Complaint: kidney stones HPI: Patient with history of nephrolithiasis and interval growth in a left staghorn calculus presents today for left percutaneous nephrostomy prior to nephrolithotomy.  Past Medical History  Diagnosis Date  . Hypertension   . Iron deficiency   . History of bladder stone   . Bladder stone   . Right ureteral stone   . Enlarged prostate   . Frequency of urination   . Nocturia   . Melanoma     benign removed from back of left ear   . Right arm fracture     Past Surgical History  Procedure Laterality Date  . Cystolithalopaxy of bladder stone/ transrectal ultrasound prostate bx  08-19-2005  . Cystoscopy with litholapaxy Right 12/02/2012    Procedure: CYSTOSCOPY WITH stone extraction;  Surgeon: Anner Crete, MD;  Location: [facility];  Service: Urology;  Laterality: Right;  . Ureteroscopy Right 12/02/2012    Procedure: RIGHT URETEROSCOPY STONE EXTRACTION WITH  STENT PLACEMENT;  Surgeon: Anner Crete, MD;  Location: [facility];  Service: Urology;  Laterality: Right;  . Holmium laser application Right 12/02/2012    Procedure: HOLMIUM LASER APPLICATION;  Surgeon: Anner Crete, MD;  Location: [facility];  Service: Urology;  Laterality: Right;  . Cystoscopy w/ retrogrades Right 12/02/2012    Procedure: CYSTOSCOPY WITH RETROGRADE PYELOGRAM;  Surgeon: Anner Crete, MD;  Location: [facility];  Service: Urology;  Laterality: Right;  . Hernia repair      right inguinal hernia   . Broken jaw surgery       History reviewed. No pertinent family history. Social History:  reports that he has been smoking Cigarettes.  He has a 100 pack-year smoking history. He has never used smokeless tobacco. He reports that he does not drink alcohol or use illicit drugs.  Allergies:  Allergies  Allergen Reactions  . Amoxicillin Other (See Comments)    Severe stomach pain  . Ibuprofen  Swelling    Eyes swell  . Naproxen Swelling    Eyes swell    Medications Prior to Admission  Medication Sig Dispense Refill  . amLODipine-olmesartan (AZOR) 10-40 MG per tablet Take 1 tablet by mouth every morning.      . Calcium-Magnesium-Zinc 333-133-5 MG TABS Take 1 tablet by mouth daily.      . cyclobenzaprine (FLEXERIL) 10 MG tablet Take 10 mg by mouth 3 (three) times daily as needed for muscle spasms.      . Ferrous Sulfate (IRON) 325 (65 FE) MG TABS Take 1 tablet by mouth every morning.      . fluticasone (FLONASE) 50 MCG/ACT nasal spray Place 2 sprays into the nose 2 (two) times daily.      . niacin 500 MG tablet Take 500 mg by mouth daily with breakfast.      . HYDROcodone-acetaminophen (NORCO) 7.5-325 MG per tablet Take 1 tablet by mouth every 6 (six) hours as needed for pain.        Results for orders placed during the hospital encounter of 01/26/13 (from the past 48 hour(s))  APTT     Status: None   Collection Time    01/26/13 10:25 AM      Result Value Range   aPTT 28  24 - 37 seconds  BASIC METABOLIC PANEL     Status: Abnormal   Collection Time    01/26/13 10:25 AM  Result Value Range   Sodium 137  135 - 145 mEq/L   Potassium 4.1  3.5 - 5.1 mEq/L   Chloride 102  96 - 112 mEq/L   CO2 29  19 - 32 mEq/L   Glucose, Bld 96  70 - 99 mg/dL   BUN 13  6 - 23 mg/dL   Creatinine, Ser 1.61  0.50 - 1.35 mg/dL   Calcium 09.6  8.4 - 04.5 mg/dL   GFR calc non Af Amer 87 (*) >90 mL/min   GFR calc Af Amer >90  >90 mL/min   Comment:            The eGFR has been calculated     using the CKD EPI equation.     This calculation has not been     validated in all clinical     situations.     eGFR's persistently     <90 mL/min signify     possible Chronic Kidney Disease.  CBC WITH DIFFERENTIAL     Status: None   Collection Time    01/26/13 10:25 AM      Result Value Range   WBC 7.1  4.0 - 10.5 K/uL   RBC 5.20  4.22 - 5.81 MIL/uL   Hemoglobin 15.8  13.0 - 17.0 g/dL   HCT  40.9  81.1 - 91.4 %   MCV 90.2  78.0 - 100.0 fL   MCH 30.4  26.0 - 34.0 pg   MCHC 33.7  30.0 - 36.0 g/dL   RDW 78.2  95.6 - 21.3 %   Platelets 218  150 - 400 K/uL   Neutrophils Relative % 66  43 - 77 %   Neutro Abs 4.7  1.7 - 7.7 K/uL   Lymphocytes Relative 22  12 - 46 %   Lymphs Abs 1.5  0.7 - 4.0 K/uL   Monocytes Relative 9  3 - 12 %   Monocytes Absolute 0.6  0.1 - 1.0 K/uL   Eosinophils Relative 2  0 - 5 %   Eosinophils Absolute 0.2  0.0 - 0.7 K/uL   Basophils Relative 1  0 - 1 %   Basophils Absolute 0.1  0.0 - 0.1 K/uL  PROTIME-INR     Status: None   Collection Time    01/26/13 10:25 AM      Result Value Range   Prothrombin Time 13.8  11.6 - 15.2 seconds   INR 1.08  0.00 - 1.49   Dg Chest 2 View  01/26/2013   *RADIOLOGY REPORT*  Clinical Data: Preoperative chest x-ray, hypertension  CHEST - 2 VIEW  Comparison: CT abdomen/pelvis 11/22/2012  Findings: Cardiac and mediastinal contours within normal limits. There is trace atherosclerotic calcification in the transverse aorta.  Linear scarring in the lingula and diffuse inferior right middle lobe as seen on recent prior CT scan.  Mild central bronchitic changes.  Lungs are well-aerated.  Negative for focal airspace consolidation, pulmonary edema or suspicious pulmonary nodule.  The lungs are mildly hyperinflated.  No acute osseous abnormality.  IMPRESSION:  1.  No acute cardiopulmonary process. 2.  Hyperinflation and central bronchitic changes suggest underlying COPD. 3.  Aortic atherosclerosis   Original Report Authenticated By: Malachy Moan, M.D.    Review of Systems  Constitutional: Negative for fever and chills.  Respiratory: Positive for cough. Negative for shortness of breath.   Cardiovascular: Negative for chest pain.  Gastrointestinal: Negative for nausea and vomiting.  Genitourinary: Positive for frequency. Negative for dysuria and  hematuria.       Intermittent rt flank discomfort  Neurological: Negative for headaches.   Endo/Heme/Allergies: Does not bruise/bleed easily.   Vitals:  BP 136/88  HR  83  R 18  TEMP 98.4  O2 SATS 97% RA Physical Exam  Constitutional: He is oriented to person, place, and time. He appears well-developed and well-nourished.  Cardiovascular: Normal rate and regular rhythm.   Respiratory: Effort normal.  Distant BS bilat  GI: Soft. Bowel sounds are normal. There is no tenderness.  Musculoskeletal: Normal range of motion. He exhibits no edema.  Neurological: He is alert and oriented to person, place, and time.     Assessment/Plan Pt with hx of nephrolithiasis and interval growth in a left staghorn calculus. Plan is for left percutaneous nephrostomy today prior to nephrolithotomy. Details/risks of procedure d/w pt/wife with their understanding and consent.  ALLRED,D KEVIN 01/27/2013, 8:38 AM

## 2013-01-27 NOTE — Progress Notes (Signed)
Patient ID: Chad Gomez, male   DOB: 1945/11/30, 67 y.o.   MRN: [id number]  Patient doing well without complaints  VSS.  Urine light pink in NT tubing.

## 2013-01-27 NOTE — Interval H&P Note (Signed)
History and Physical Interval Note:  No new complaints.    Lungs clear, CV RRR.   01/27/2013 11:59 AM  Chad Gomez  has presented today for surgery, with the diagnosis of LEFT RENAL STONES  The various methods of treatment have been discussed with the patient and family. After consideration of risks, benefits and other options for treatment, the patient has consented to  Procedure(s): NEPHROLITHOTOMY PERCUTANEOUS FIRST LOOK (Left) HOLMIUM LASER APPLICATION (Left) as a surgical intervention .  The patient's history has been reviewed, patient examined, no change in status, stable for surgery.  I have reviewed the patient's chart and labs.  Questions were answered to the patient's satisfaction.     Chad Gomez

## 2013-01-28 ENCOUNTER — Observation Stay (HOSPITAL_COMMUNITY): Payer: BC Managed Care – PPO

## 2013-01-28 ENCOUNTER — Encounter (HOSPITAL_COMMUNITY): Payer: Self-pay | Admitting: Urology

## 2013-01-28 MED ORDER — CIPROFLOXACIN HCL 500 MG PO TABS: 500.0000 mg | ORAL_TABLET | Freq: Two times a day (BID) | ORAL | Status: DC

## 2013-01-28 MED ORDER — OXYCODONE HCL 5 MG PO TABS: 5.0000 mg | ORAL_TABLET | ORAL | Status: DC | PRN

## 2013-01-28 NOTE — Op Note (Signed)
Chad Gomez, Chad Gomez              ACCOUNT NO.:  [id number]  MEDICAL RECORD NO.:  [id number]  LOCATION:                                 FACILITY:  PHYSICIAN:  Excell Seltzer. Annabell Howells, M.D.    DATE OF BIRTH:  02/07/1946  DATE OF PROCEDURE:  01/27/2013 DATE OF DISCHARGE:                              OPERATIVE REPORT   PROCEDURE:  Left percutaneous nephrolithotomy for greater than 2 cm stone.  PREOPERATIVE DIAGNOSIS:  A 3.4 cm left staghorn stone.  POSTOPERATIVE DIAGNOSIS:  A 3.4 cm left staghorn stone.  SURGEON:  Excell Seltzer. Annabell Howells, M.D.  ANESTHESIA:  General.  BLOOD LOSS:  Minimal.  DRAINS: 1. A 24-French Ainsworth nephrostomy tube. 2. Foley catheter. 3. A 6-French Kumpe safety catheter on the left.  SPECIMEN:  Stone fragments which were given to the family.  COMPLICATIONS:  None.  INDICATIONS:  Mr. Gater is a 67 year old white male with a history of stones who was lost to follow up.  He presented recently and was found to have a 3.4 cm left partial staghorn stone with some upper pole obstruction and cortical atrophy.  It was felt that percutaneous nephrolithotomy was indicated for management.  FINDINGS OF PROCEDURE:  He was taken to the Radiology this morning where Dr. [name] placed a left nephrostomy tube through a mid pole access.  He was then taken to the operating room, was given Cipro.  General anesthetic was induced on the holding room stretcher.  A Foley catheter was inserted using sterile technique.  He was then rolled prone on chest rolls and all pressure points were padded.  The nephrostomy tube left back were then prepped with Betadine solution. The area was dried with towels.  Benzoin was applied and Lingeman nephrostomy drape was applied.  The nephrostomy tube was then uncapped and a guidewire was passed to the bladder.  The catheter was removed over the wire.  A 10-French peel-away sheath was then inserted over the wire and advanced into the proximal ureter.   The inner core was removed and a second wire was then passed to the bladder.  The Kumpe catheter was required to help negotiated into the bladder.  Once in position, the peel-away sheath was removed and the skin was incised to accept the nephrostomy sheath.  A Trackmaster balloon was then passed over the working wire.  The tip was located in the pelvis on fluoroscopy and it was then inflated to 18 atmospheres.  With the balloon inflated, the 30-French sheath was inserted over the balloon into the renal pelvis.  The balloon was deflated and removed.  There was minimal bleeding.  The 28-French rigid nephroscope was then passed through the nephrostomy sheath into the renal collecting system where stones were immediately identified.  Several smaller stones from the staghorn cluster were removed with a two-prong grasper.  The Cook CO2 lithotrite was then used to fragment the stones in the upper pole and manageable fragments which were then removed with a two- prong grasper, only some minimal 1 mm and smaller fragments remained adherent to the mucosa.  Once these had been removed, the stones in the renal pelvis were then engaged with the lithotrite and fragmented.  The fragments were removed in a similar fashion.  Once the bulk of the fragments had been removed, fluoroscopy was performed and only residual stone was about a 5 mm stone in the lower calyx.  The flexible nephroscope was then passed into the lower calyx where the stone was identified and grasped with a basket and removed.  A few additional small 1-2 mm fragments in the proximal ureter and renal pelvis were then removed using the flexible scope in the basket.  Once these had been removed, final inspection only revealed finding less than 1 mm fragments adherent to the mucosa and in the pelvis and proximal ureter, and upper pole but there were felt to be too small to retrieve and should pass spontaneously.  At this point, the  guidewire was repositioned through the sheath into the proximal ureter, and a 24-French Oleh Genin was used as the main nephrostomy catheter was positioned in the pelvis.  A nephrostomy sheath was backed out a bit and the balloon was filled with 3 mL of sterile fluid.  Nephrostogram was then performed which revealed no extravasation from the renal pelvis.  No significant filling defects.  There was antegrade flow to the bladder.  The sheath was then backed out.  The remaining way over the catheter and a 6-French Kumpe catheter was inserted over the safety wire to the distal ureter.  The wire was removed and the nephrostomy catheter and safety catheter was then secured to the skin with a 0 silk suture which was also used to tighten the incision.  At this point, the nephrostomy sheath was cut away from the catheter which was placed to straight drainage.  The Kumpe catheter was capped. The drapes removed, and a dressing was applied.  The patient was then rolled back supine on the recovery room stretcher.  His anesthetic was reversed.  He was moved to recovery room in stable condition.  There were no complications.     Excell Seltzer. Annabell Howells, M.D.     JJW/MEDQ  D:  01/27/2013  T:  01/28/2013  Job:  [id number]

## 2013-01-28 NOTE — Discharge Summary (Signed)
Physician Discharge Summary  Patient ID: Chad Gomez MRN: [id number] DOB/AGE: 1945/08/21 67 y.o.  Admit date: 01/27/2013 Discharge date: 01/28/2013  Admission Diagnoses: left staghorn stone  Discharge Diagnoses: same Active Problems:  left staghorn stone   Discharged Condition: good  Hospital Course: [name] had a left PCNL for a 3.4cm left staghorn stone.  He did well and KUB today shows no residual fragments.   Tubes and foley removed and he is ready for discharge.   Consults: None  Significant Diagnostic Studies: KUB  Treatments: surgery: Left PCNL  Discharge Exam: Blood pressure 144/81, pulse 87, temperature 98.7 F (37.1 C), temperature source Oral, resp. rate 19, height 5\' 9"  (1.753 m), weight 82.555 kg (182 lb), SpO2 91.00%. General appearance: alert and no distress Wound is ok and a dressing was placed after tube removal.   Disposition: 01-Home or Self Care  Discharge Orders   Future Orders Complete By Expires     Care order/instruction  As directed     Scheduling Instructions:      Routine nephrostomy tube care.        Medication List    STOP taking these medications       HYDROcodone-acetaminophen 7.5-325 MG per tablet  Commonly known as:  NORCO      TAKE these medications       AZOR 10-40 MG per tablet  Generic drug:  amLODipine-olmesartan  Take 1 tablet by mouth every morning.     Calcium-Magnesium-Zinc 333-133-5 MG Tabs  Take 1 tablet by mouth daily.     ciprofloxacin 500 MG tablet  Commonly known as:  CIPRO  Take 1 tablet (500 mg total) by mouth 2 (two) times daily.     cyclobenzaprine 10 MG tablet  Commonly known as:  FLEXERIL  Take 10 mg by mouth 3 (three) times daily as needed for muscle spasms.     fluticasone 50 MCG/ACT nasal spray  Commonly known as:  FLONASE  Place 2 sprays into the nose 2 (two) times daily.     Iron 325 (65 FE) MG Tabs  Take 1 tablet by mouth every morning.     niacin 500 MG tablet  Take 500 mg by  mouth daily with breakfast.     oxyCODONE 5 MG immediate release tablet  Commonly known as:  Oxy IR/ROXICODONE  Take 1 tablet (5 mg total) by mouth every 4 (four) hours as needed.           Follow-up Information   Follow up with Anner Crete, MD On 02/03/2013. [phone])    Contact information:   [address] [place] [address] [phone]       Signed: Anner Crete 01/28/2013, 8:07 AM

## 2013-02-03 SURGERY — NEPHROLITHOTOMY PERCUTANEOUS SECOND LOOK
Anesthesia: Choice | Laterality: Left

## 2013-07-20 ENCOUNTER — Encounter: Payer: Self-pay | Admitting: Internal Medicine

## 2013-08-15 ENCOUNTER — Other Ambulatory Visit: Payer: Self-pay | Admitting: Urology

## 2013-08-19 ENCOUNTER — Other Ambulatory Visit: Payer: Self-pay | Admitting: Urology

## 2013-08-26 ENCOUNTER — Encounter (HOSPITAL_COMMUNITY): Payer: BC Managed Care – PPO

## 2013-09-02 ENCOUNTER — Ambulatory Visit (AMBULATORY_SURGERY_CENTER): Payer: BC Managed Care – PPO | Admitting: *Deleted

## 2013-09-02 VITALS — Ht 70.0 in | Wt 171.0 lb

## 2013-09-02 DIAGNOSIS — Z1211 Encounter for screening for malignant neoplasm of colon: Secondary | ICD-10-CM

## 2013-09-02 MED ORDER — MOVIPREP 100 G PO SOLR: ORAL | Status: DC

## 2013-09-02 NOTE — Progress Notes (Signed)
No allergies to eggs or soy. No problems with anesthesia.  

## 2013-09-07 ENCOUNTER — Encounter (HOSPITAL_COMMUNITY): Payer: Self-pay

## 2013-09-07 ENCOUNTER — Encounter (HOSPITAL_COMMUNITY)
Admission: RE | Admit: 2013-09-07 | Discharge: 2013-09-07 | Disposition: A | Discharge status: Home / Self Care | Payer: BC Managed Care – PPO | Source: Ambulatory Visit | Attending: Urology | Admitting: Urology

## 2013-09-07 DIAGNOSIS — E213 Hyperparathyroidism, unspecified: Secondary | ICD-10-CM | POA: Insufficient documentation

## 2013-09-07 MED ORDER — TECHNETIUM TC 99M SESTAMIBI GENERIC - CARDIOLITE
24.0000 | Freq: Once | INTRAVENOUS | Status: AC | PRN
  Administered 2013-09-07: 24 via INTRAVENOUS

## 2013-09-13 ENCOUNTER — Ambulatory Visit (AMBULATORY_SURGERY_CENTER): Payer: BC Managed Care – PPO | Admitting: Internal Medicine

## 2013-09-13 ENCOUNTER — Encounter: Payer: Self-pay | Admitting: Internal Medicine

## 2013-09-13 VITALS — BP 131/92 | HR 77 | Temp 96.3°F | Resp 29 | Ht 70.0 in | Wt 171.0 lb

## 2013-09-13 DIAGNOSIS — Z1211 Encounter for screening for malignant neoplasm of colon: Secondary | ICD-10-CM

## 2013-09-13 DIAGNOSIS — D126 Benign neoplasm of colon, unspecified: Secondary | ICD-10-CM

## 2013-09-13 HISTORY — PX: COLONOSCOPY W/ POLYPECTOMY: SHX1380

## 2013-09-13 MED ORDER — SODIUM CHLORIDE 0.9 % IV SOLN
500.0000 mL | INTRAVENOUS | Status: DC
Start: 2013-09-13 — End: 2013-09-14

## 2013-09-13 NOTE — Op Note (Signed)
[place]  [address] [place], [address]   COLONOSCOPY PROCEDURE REPORT  PATIENT: [name], [name].  MR#: [id number] BIRTHDATE: 12-05-1945 , 67  yrs. old GENDER: Male ENDOSCOPIST: Jerene Bears, MD REFERRED CB:ULAGT Cloward, M.D. PROCEDURE DATE:  09/13/2013 PROCEDURE:   Colonoscopy with snare polypectomy First Screening Colonoscopy - Avg.  risk and is 50 yrs.  old or older Yes.  Prior Negative Screening - Now for repeat screening. N/A  History of Adenoma - Now for follow-up colonoscopy & has been > or = to 3 yrs.  N/A  Polyps Removed Today? Yes. ASA CLASS:   Class III INDICATIONS:average risk screening and first colonoscopy. MEDICATIONS: MAC sedation, administered by CRNA and propofol (Diprivan) 250mg  IV  DESCRIPTION OF PROCEDURE:   After the risks benefits and alternatives of the procedure were thoroughly explained, informed consent was obtained.  A digital rectal exam revealed no rectal mass.   The LB [id number]  endoscope was introduced through the anus and advanced to the cecum, which was identified by both the appendix and ileocecal valve. No adverse events experienced. The quality of the prep was Moviprep fair requiring copious irrigation and lavage. The instrument was then slowly withdrawn as the colon was fully examined.    COLON FINDINGS: Five sessile polyps measuring 4-8 mm in size, several with mucous caps, were found in the ascending colon (2), at the hepatic flexure (1), and in the transverse colon (2). Polypectomy was performed using cold snare.  All resections were complete and all polyp tissue was completely retrieved.   The colon mucosa was otherwise unremarkable.  Retroflexed views revealed internal/external hemorrhoids. The time to cecum=5 minutes 04 seconds.  Withdrawal time=20 minutes 36 seconds.  The scope was withdrawn and the procedure completed.  COMPLICATIONS: There were no complications.  ENDOSCOPIC IMPRESSION: 1.    Five sessile polyps measuring 4-8 mm in size were found in the ascending colon, at the hepatic flexure, and in the transverse colon; Polypectomy was performed using cold snare 2.   The colon mucosa was otherwise normal  RECOMMENDATIONS: 1.  Hold aspirin, aspirin products, and anti-inflammatory medication for 1 week. 2.  Await pathology results 3.  If the polyps removed today are proven to be adenomatous (pre-cancerous) polyps, you will need a colonoscopy in 3 years. Otherwise you should continue to follow colorectal cancer screening guidelines for "routine risk" patients with a colonoscopy in 10 years.  You will receive a letter within 1-2 weeks with the results of your biopsy as well as final recommendations.  Please call my office if you have not received a letter after 3 weeks. 4.  One of your biggest health concerns is your smoking.  This increases your risk for most cancers and serious cardiovascular diseases such as strokes, heart attacks.  You should try your best to stop.  If you need assistance, please contact your PCP or Smoking Cessation Class at [facility] [phone]) or [facility] (1-800-QUIT-NOW).   eSigned:  Jerene Bears, MD 09/13/2013 8:45 AM   cc: The Patient and Gwenlyn Perking, MD   PATIENT NAME:  Chad Gomez, Chad Gomez. MR#: [id number]

## 2013-09-13 NOTE — Progress Notes (Signed)
Report to pacu rn, vss, bbs=clear 

## 2013-09-13 NOTE — Progress Notes (Signed)
Called to room to assist during endoscopic procedure.  Patient ID and intended procedure confirmed with present staff. Received instructions for my participation in the procedure from the performing physician.  

## 2013-09-13 NOTE — Patient Instructions (Signed)
YOU HAD AN ENDOSCOPIC PROCEDURE TODAY AT THE [facility]: Refer to the procedure report that was given to you for any specific questions about what was found during the examination.  If the procedure report does not answer your questions, please call your gastroenterologist to clarify.  If you requested that your care partner not be given the details of your procedure findings, then the procedure report has been included in a sealed envelope for you to review at your convenience later.  YOU SHOULD EXPECT: Some feelings of bloating in the abdomen. Passage of more gas than usual.  Walking can help get rid of the air that was put into your GI tract during the procedure and reduce the bloating. If you had a lower endoscopy (such as a colonoscopy or flexible sigmoidoscopy) you may notice spotting of blood in your stool or on the toilet paper. If you underwent a bowel prep for your procedure, then you may not have a normal bowel movement for a few days.  DIET: Your first meal following the procedure should be a light meal and then it is ok to progress to your normal diet.  A half-sandwich or bowl of soup is an example of a good first meal.  Heavy or fried foods are harder to digest and may make you feel nauseous or bloated.  Likewise meals heavy in dairy and vegetables can cause extra gas to form and this can also increase the bloating.  Drink plenty of fluids but you should avoid alcoholic beverages for 24 hours.  ACTIVITY: Your care partner should take you home directly after the procedure.  You should plan to take it easy, moving slowly for the rest of the day.  You can resume normal activity the day after the procedure however you should NOT DRIVE or use heavy machinery for 24 hours (because of the sedation medicines used during the test).    SYMPTOMS TO REPORT IMMEDIATELY: A gastroenterologist can be reached at any hour.  During normal business hours, 8:30 AM to 5:00 PM Monday through Friday,  call [phone].  After hours and on weekends, please call the GI answering service at [phone] who will take a message and have the physician on call contact you.   Following lower endoscopy (colonoscopy or flexible sigmoidoscopy):  Excessive amounts of blood in the stool  Significant tenderness or worsening of abdominal pains  Swelling of the abdomen that is new, acute  Fever of 100F or higher  FOLLOW UP: If any biopsies were taken you will be contacted by phone or by letter within the next 1-3 weeks.  Call your gastroenterologist if you have not heard about the biopsies in 3 weeks.  Our staff will call the home number listed on your records the next business day following your procedure to check on you and address any questions or concerns that you may have at that time regarding the information given to you following your procedure. This is a courtesy call and so if there is no answer at the home number and we have not heard from you through the emergency physician on call, we will assume that you have returned to your regular daily activities without incident.  SIGNATURES/CONFIDENTIALITY: You and/or your care partner have signed paperwork which will be entered into your electronic medical record.  These signatures attest to the fact that that the information above on your After Visit Summary has been reviewed and is understood.  Full responsibility of the confidentiality of this   discharge information lies with you and/or your care-partner.  Recommendations Hold aspirin, aspirin products, and anti-inflammatory medication for 1 week Await pathology results Next colonoscopy determined by pathology results Smoking Cessation Class at [facility] [phone]

## 2013-09-14 ENCOUNTER — Telehealth: Payer: Self-pay | Admitting: *Deleted

## 2013-09-14 NOTE — Telephone Encounter (Signed)
  Follow up Call-  Call back number 09/13/2013  Post procedure Call Back phone  # [phone]  Permission to leave phone message Yes    No answer,left message.

## 2013-09-19 ENCOUNTER — Encounter: Payer: Self-pay | Admitting: Internal Medicine

## 2013-09-20 ENCOUNTER — Encounter: Payer: Self-pay | Admitting: *Deleted

## 2013-10-11 ENCOUNTER — Encounter (HOSPITAL_BASED_OUTPATIENT_CLINIC_OR_DEPARTMENT_OTHER): Payer: Self-pay | Admitting: *Deleted

## 2013-10-12 ENCOUNTER — Encounter (HOSPITAL_BASED_OUTPATIENT_CLINIC_OR_DEPARTMENT_OTHER): Payer: Self-pay | Admitting: *Deleted

## 2013-10-12 NOTE — Progress Notes (Signed)
SPOKE W/ PT WIFE.  NPO AFTER MN. NEEDS ISTAT AND KUB.  CURRENT EKG IN CHART AND EPIC.  WILL TAKE NORVASC AM DOS W/ SIPS OF WATER.

## 2013-10-12 NOTE — Progress Notes (Signed)
10/12/13 0937  OBSTRUCTIVE SLEEP APNEA  Have you ever been diagnosed with sleep apnea through a sleep study? No  Do you snore loudly (loud enough to be heard through closed doors)?  1  Do you often feel tired, fatigued, or sleepy during the daytime? 0  Has anyone observed you stop breathing during your sleep? 0  Do you have, or are you being treated for high blood pressure? 1  BMI more than 35 kg/m2? 0  Age over 67 years old? 1  Neck circumference greater than 40 cm/16 inches? 0  Gender: 1  Obstructive Sleep Apnea Score 4  Score 4 or greater  Results sent to PCP

## 2013-10-12 NOTE — H&P (Signed)
ctive Problems Problems  1. Bilateral kidney stones (592.0) 2. Bladder calculus (594.1) 3. Calculus of ureter (592.1) 4. Elevated prostate specific antigen (PSA) (790.93) 5. Erectile dysfunction due to arterial insufficiency (607.84) 6. Hypercalcemia (275.42) 7. 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 Nodular prostate without lower urinary tract symptoms (600.10) 9. Vitamin D deficiency (268.9)  History of Present Illness Chad Gomez returns today in f/u for his history of recurrent urolithiasis. He has hypercalcuria and borderline hypercalcemia.  His PTH is high normal with a high normal calcium. He had vit D deficiency and was given Vit supplementation. His Vit D level has normalized but his PTH and calcium are unchanged.  On KUB today he has an enlarging RLP stone and slightly larger small LUP stones. He has a right bladder stone and probable right distal ureteral stone that haven't changed since 11/14. He has had no hematuria and only mild right flank pain. He has been on urocit K but had the dose reduced by his PCP for hyperkalemia.  He also reports persistent ED that has failed therapy with PDE5's and injections and he would like to try a VED.   Past Medical History Problems  1. History of Alopecia Areata (704.01) 2. History of Arthritis (V13.4) 3. History of hypertension (V12.59) 4. History of kidney stones (V13.01) 5. History of Microscopic hematuria (599.72) 6. History of Pyuria (791.9)  Surgical History Problems  1. History of Biopsy Of The Prostate Needle 2. History of Cystoscopy With Fragmentation Of Bladder Calculus 3. History of Cystoscopy With Insertion Of Ureteral Stent Right 4. History of Cystoscopy With Ureteroscopy With Lithotripsy 5. History of Inguinal Hernia Repair 6. History of Lithotripsy 7. History of Percutaneous Lithotomy For Stone Over 2cm.  Current Meds 1. Azor 10-40 MG Oral Tablet;  Therapy: (Recorded:28May2014) to Recorded 2. Flonase 50 MCG/ACT Nasal Suspension;   Therapy: (Recorded:28May2014) to Recorded 3. Iron TABS;  Therapy: (Recorded:28May2014) to Recorded 4. Niacin 500 MG Oral Tablet;  Therapy: (Recorded:28May2014) to Recorded 5. Potassium Citrate ER 10 MEQ (1080 MG) Oral Tablet Extended Release; TAKE 1 TABLET  TID;  Therapy: [id number] to (Last Rx:17Nov2014)  Requested for: [id number] Ordered  Allergies Medication  1. Amoxicillin TABS 2. Naproxen TABS Non-Medication  3. Tape  Family History Problems  1. Family history of Death In The Family Father   age 36 of alzheimers 2. Family history of Death In The Family Mother   age 17 of alzheimers 3. Family history of Family Health Status Number Of Children   2 daughters 39. Family history of Hypertension (V17.49) : Sister  Social History Problems  1. Denied: History of Alcohol Use 2. Denied: History of Caffeine Use 3. Current every day smoker (305.1) 4. Marital History - Currently Married 5. Occupation:   Retail 6. Tobacco use (305.1)   smokes 40 cigarettes a day  x 52 years  Past,family and social history reviewed and updated.   Review of Systems Genitourinary, constitutional, skin, eye, otolaryngeal, hematologic/lymphatic, cardiovascular, pulmonary, endocrine, musculoskeletal, gastrointestinal, neurological and psychiatric system(s) were reviewed and pertinent findings if present are noted.  ENT: sinus problems.  Cardiovascular: no chest pain.  Respiratory: no shortness of breath.    Vitals Vital Signs [Data Includes: Last 1 Day]  Recorded: 23Feb2015 08:53AM  Blood Pressure: 139 / 87 Temperature: 98.3 F Heart Rate: 77  Physical Exam Constitutional: Well nourished and well developed . No acute distress.  Pulmonary: No respiratory distress and normal respiratory rhythm and effort.  Cardiovascular: Heart rate and rhythm are normal . No peripheral edema.  Abdomen:  The abdomen is soft and nontender. No masses are palpated. No CVA tenderness. No hernias are palpable. No  hepatosplenomegaly noted.    Results/Data Urine [Data Includes: Last 1 Day]   [id number]  COLOR YELLOW   APPEARANCE CLEAR   SPECIFIC GRAVITY 1.010   pH 6.5   GLUCOSE NEG mg/dL  BILIRUBIN NEG   KETONE NEG mg/dL  BLOOD NEG   PROTEIN NEG mg/dL  UROBILINOGEN 0.2 mg/dL  NITRITE NEG   LEUKOCYTE ESTERASE NEG    The following images/tracing/specimen were independently visualized:  KUB today shows a 37mm RLP stone and several 3-50mm LUP stones. There is a 34mm stone in the area of the right distal ureter and in the bladder as well. There are laminar calcifications in the RUQ that are consistent with his known me gallstones.  The following clinical lab reports were reviewed:  UA reviewed.  Will request old records/history: Will get labs from Dr. Thea Silversmith. Selected Results  PTH INTACT with CALCIUM [id number] 11:21AM Irine Seal  SPECIMEN TYPE: BLOOD   Test Name Result Flag Reference  PARATHYROID HORMONE 69.4 pg/mL  14.0-72.0  CALCIUM 10.0 mg/dL  8.4-10.5   VITAMIN D [id number] 11:21AM Irine Seal  SPECIMEN TYPE: BLOOD   Test Name Result Flag Reference  Vitamin D (25-Hydroxy) 56 ng/mL  30-89  THIS ASSAY ACCURATELY QUANTIFIES VITAMIN D, WHICH IS THE SUM OF THE 25-HYDROXY FORMS OF VITAMIN D2 AND D3.  STUDIES HAVE SHOWN THAT THE OPTIMUM CONCENTRATION OF 25-HYDROXY VITAMIN D IS 30 NG/ML OR HIGHER.  CONCENTRATIONS OF VITAMIN D BETWEEN 20 AND 29 NG/ML ARE CONSIDERED TO BE INSUFFICIENT AND CONCENTRATIONS LESS THAN 20 NG/ML ARE CONSIDERED TO BE DEFICIENT FOR VITAMIN D.   VITAMIN D [id number] 03:13PM Irine Seal  SPECIMEN TYPE: BLOOD   Test Name Result Flag Reference  Vitamin D (25-Hydroxy) 26 ng/mL L 30-89  THIS ASSAY ACCURATELY QUANTIFIES VITAMIN D, WHICH IS THE SUM OF THE 25-HYDROXY FORMS OF VITAMIN D2 AND D3.  STUDIES HAVE SHOWN THAT THE OPTIMUM CONCENTRATION OF 25-HYDROXY VITAMIN D IS 30 NG/ML OR HIGHER.  CONCENTRATIONS OF VITAMIN D BETWEEN 20 AND 29 NG/ML ARE CONSIDERED TO BE INSUFFICIENT  AND CONCENTRATIONS LESS THAN 20 NG/ML ARE CONSIDERED TO BE DEFICIENT FOR VITAMIN D.   HYPERCALCIURA PROFILE [id number] 03:14PM Irine Seal  SPECIMEN TYPE: BLOOD   Test Name Result Flag Reference  PHOSPHORUS 3.3 mg/dL  2.3-4.6  URIC ACID 6.2 mg/dL  4.0-7.8  SODIUM 141 mEq/L  135-145  POTASSIUM 4.1 mEq/L  3.5-5.3  CHLORIDE 104 mEq/L  96-112  CO2 27 mEq/L  19-32  CREATININE 1.05 mg/dL  0.50-1.50  PARATHYROID HORMONE 71.9 pg/mL  14.0-72.0  CALCIUM 9.5 mg/dL  8.4-10.5   Assessment Assessed  1. Calculus of ureter (592.1) 2. Bladder calculus (594.1) 3. Bilateral kidney stones (592.0) 4. Hypercalcemia (275.42) 5. Vitamin D deficiency (268.9) 6. Erectile dysfunction due to arterial insufficiency (607.84)  He has recurrent stones in the right distal ureter and bladder and bilateral renal stones with some interval growth.  His PTH and calcium didn't change despite normalization of his Vit D with therapy.   He remains on urocit K but only takes it 2 x daily and his K was high on one draw with Dr. Thea Silversmith but then returned to normal.  He has severe ED and is interested in a VED.   Plan Health Maintenance  1. UA With REFLEX; [Do Not Release]; Status:Resulted - Requires Verification;   Done:  [id number] 08:26AM Hypercalcemia  2. OUTSIDE RAD MISC; Status:Hold For -  Appointment,PreCert,Print,Records; Requested  for:23Feb2015;  3. Get Outside Records Office  Follow-up  Status: Hold For - Records,Records Release   Requested for: [id number]  I am going to get a Sestamibi scan to assess the parathyroids.  Once that is done, if normal, I will proceed with removal of the ureteral and bladder stones and reviewed the risks.  I have given him a script for a VED.  I will get records from Dr. Thea Silversmith.  He was strongly encouraged to quit smoking.   Discussion/Summary CC: Dr. Gwenlyn Perking.

## 2013-10-13 ENCOUNTER — Encounter (HOSPITAL_BASED_OUTPATIENT_CLINIC_OR_DEPARTMENT_OTHER)
Admission: RE | Disposition: A | Discharge status: Home / Self Care | Payer: Self-pay | Source: Ambulatory Visit | Attending: Urology

## 2013-10-13 ENCOUNTER — Ambulatory Visit (HOSPITAL_BASED_OUTPATIENT_CLINIC_OR_DEPARTMENT_OTHER)
Admission: RE | Admit: 2013-10-13 | Discharge: 2013-10-13 | Disposition: A | Discharge status: Home / Self Care | Payer: BC Managed Care – PPO | Source: Ambulatory Visit | Attending: Urology | Admitting: Urology

## 2013-10-13 ENCOUNTER — Ambulatory Visit (HOSPITAL_BASED_OUTPATIENT_CLINIC_OR_DEPARTMENT_OTHER): Payer: BC Managed Care – PPO | Admitting: Anesthesiology

## 2013-10-13 ENCOUNTER — Encounter (HOSPITAL_BASED_OUTPATIENT_CLINIC_OR_DEPARTMENT_OTHER): Payer: BC Managed Care – PPO | Admitting: Anesthesiology

## 2013-10-13 ENCOUNTER — Ambulatory Visit (HOSPITAL_COMMUNITY): Payer: BC Managed Care – PPO

## 2013-10-13 ENCOUNTER — Encounter (HOSPITAL_BASED_OUTPATIENT_CLINIC_OR_DEPARTMENT_OTHER): Payer: Self-pay

## 2013-10-13 DIAGNOSIS — N3289 Other specified disorders of bladder: Secondary | ICD-10-CM | POA: Insufficient documentation

## 2013-10-13 DIAGNOSIS — F17200 Nicotine dependence, unspecified, uncomplicated: Secondary | ICD-10-CM | POA: Insufficient documentation

## 2013-10-13 DIAGNOSIS — N323 Diverticulum of bladder: Secondary | ICD-10-CM | POA: Insufficient documentation

## 2013-10-13 DIAGNOSIS — N201 Calculus of ureter: Secondary | ICD-10-CM | POA: Diagnosis present

## 2013-10-13 DIAGNOSIS — R972 Elevated prostate specific antigen [PSA]: Secondary | ICD-10-CM | POA: Insufficient documentation

## 2013-10-13 DIAGNOSIS — Z79899 Other long term (current) drug therapy: Secondary | ICD-10-CM | POA: Insufficient documentation

## 2013-10-13 DIAGNOSIS — I10 Essential (primary) hypertension: Secondary | ICD-10-CM | POA: Insufficient documentation

## 2013-10-13 DIAGNOSIS — N211 Calculus in urethra: Secondary | ICD-10-CM | POA: Insufficient documentation

## 2013-10-13 DIAGNOSIS — N210 Calculus in bladder: Secondary | ICD-10-CM | POA: Diagnosis present

## 2013-10-13 DIAGNOSIS — E559 Vitamin D deficiency, unspecified: Secondary | ICD-10-CM | POA: Insufficient documentation

## 2013-10-13 DIAGNOSIS — N529 Male erectile dysfunction, unspecified: Secondary | ICD-10-CM | POA: Insufficient documentation

## 2013-10-13 HISTORY — DX: Personal history of colonic polyps: Z86.010

## 2013-10-13 HISTORY — PX: CYSTOSCOPY WITH LITHOLAPAXY: SHX1425

## 2013-10-13 HISTORY — DX: Other specified postprocedural states: Z98.890

## 2013-10-13 HISTORY — DX: Benign prostatic hyperplasia without lower urinary tract symptoms: N40.0

## 2013-10-13 HISTORY — DX: Presence of spectacles and contact lenses: Z97.3

## 2013-10-13 HISTORY — DX: Other specified postprocedural states: Z85.820

## 2013-10-13 HISTORY — DX: Adverse effect of unspecified anesthetic, initial encounter: T41.45XA

## 2013-10-13 HISTORY — DX: Other complications of anesthesia, initial encounter: T88.59XA

## 2013-10-13 HISTORY — PX: CYSTOSCOPY W/ RETROGRADES: SHX1426

## 2013-10-13 HISTORY — DX: Personal history of colon polyps, unspecified: Z86.0100

## 2013-10-13 HISTORY — DX: Personal history of urinary calculi: Z87.442

## 2013-10-13 LAB — POCT I-STAT 4, (NA,K, GLUC, HGB,HCT)
GLUCOSE: 107 mg/dL — AB (ref 70–99)
HEMATOCRIT: 52 % (ref 39.0–52.0)
HEMOGLOBIN: 17.7 g/dL — AB (ref 13.0–17.0)
POTASSIUM: 4 meq/L (ref 3.7–5.3)
Sodium: 140 mEq/L (ref 137–147)

## 2013-10-13 SURGERY — CYSTOSCOPY, WITH BLADDER CALCULUS LITHOLAPAXY
Anesthesia: General | Site: Urethra | Laterality: Right

## 2013-10-13 MED ORDER — HYDROCODONE-ACETAMINOPHEN 5-325 MG PO TABS: 1.0000 | ORAL_TABLET | Freq: Four times a day (QID) | ORAL | Status: DC | PRN

## 2013-10-13 MED ORDER — IOHEXOL 350 MG/ML SOLN
INTRAVENOUS | Status: DC | PRN
  Administered 2013-10-13: 10 mL

## 2013-10-13 MED ORDER — LIDOCAINE HCL 2 % EX GEL
CUTANEOUS | Status: DC | PRN
  Administered 2013-10-13: 1 via URETHRAL

## 2013-10-13 MED ORDER — ONDANSETRON HCL 4 MG/2ML IJ SOLN
INTRAMUSCULAR | Status: DC | PRN
  Administered 2013-10-13: 4 mg via INTRAVENOUS

## 2013-10-13 MED ORDER — LACTATED RINGERS IV SOLN
INTRAVENOUS | Status: DC
  Administered 2013-10-13: 07:00:00 via INTRAVENOUS
  Filled 2013-10-13: qty 1000

## 2013-10-13 MED ORDER — SODIUM CHLORIDE 0.9 % IR SOLN
Status: DC | PRN
  Administered 2013-10-13: 2000 mL

## 2013-10-13 MED ORDER — LIDOCAINE HCL (CARDIAC) 20 MG/ML IV SOLN
INTRAVENOUS | Status: DC | PRN
  Administered 2013-10-13: 80 mg via INTRAVENOUS

## 2013-10-13 MED ORDER — FENTANYL CITRATE 0.05 MG/ML IJ SOLN
INTRAMUSCULAR | Status: DC | PRN
  Administered 2013-10-13: 50 ug via INTRAVENOUS

## 2013-10-13 MED ORDER — BELLADONNA ALKALOIDS-OPIUM 16.2-60 MG RE SUPP
RECTAL | Status: DC | PRN
Start: 2013-10-13 — End: 2013-10-13
  Administered 2013-10-13: 1 via RECTAL

## 2013-10-13 MED ORDER — FENTANYL CITRATE 0.05 MG/ML IJ SOLN
INTRAMUSCULAR | Status: AC
  Filled 2013-10-13: qty 4

## 2013-10-13 MED ORDER — MIDAZOLAM HCL 5 MG/5ML IJ SOLN
INTRAMUSCULAR | Status: DC | PRN
  Administered 2013-10-13: 2 mg via INTRAVENOUS

## 2013-10-13 MED ORDER — BELLADONNA ALKALOIDS-OPIUM 16.2-60 MG RE SUPP
RECTAL | Status: AC
  Filled 2013-10-13: qty 1

## 2013-10-13 MED ORDER — MIDAZOLAM HCL 2 MG/2ML IJ SOLN
INTRAMUSCULAR | Status: AC
  Filled 2013-10-13: qty 2

## 2013-10-13 MED ORDER — CIPROFLOXACIN IN D5W 400 MG/200ML IV SOLN
400.0000 mg | INTRAVENOUS | Status: AC
  Administered 2013-10-13: 400 mg via INTRAVENOUS
  Filled 2013-10-13: qty 200

## 2013-10-13 MED ORDER — DEXAMETHASONE SODIUM PHOSPHATE 4 MG/ML IJ SOLN
INTRAMUSCULAR | Status: DC | PRN
  Administered 2013-10-13: 10 mg via INTRAVENOUS

## 2013-10-13 MED ORDER — PROPOFOL 10 MG/ML IV BOLUS
INTRAVENOUS | Status: DC | PRN
  Administered 2013-10-13: 200 mg via INTRAVENOUS

## 2013-10-13 SURGICAL SUPPLY — 52 items
ADAPTER CATH URET PLST 4-6FR (CATHETERS) IMPLANT
ADPR CATH URET STRL DISP 4-6FR (CATHETERS)
BAG DRAIN URO-CYSTO SKYTR STRL (DRAIN) ×5 IMPLANT
BAG DRN UROCATH (DRAIN) ×3
BASKET LASER NITINOL 1.9FR (BASKET) IMPLANT
BASKET SEGURA 3FR (UROLOGICAL SUPPLIES) IMPLANT
BASKET STNLS GEMINI 4WIRE 3FR (BASKET) IMPLANT
BASKET ZERO TIP NITINOL 2.4FR (BASKET) ×4 IMPLANT
BSKT STON RTRVL 120 1.9FR (BASKET)
BSKT STON RTRVL GEM 120X11 3FR (BASKET)
BSKT STON RTRVL ZERO TP 2.4FR (BASKET) ×3
CANISTER SUCT LVC 12 LTR MEDI- (MISCELLANEOUS) ×5 IMPLANT
CATH INTERMIT  6FR 70CM (CATHETERS) IMPLANT
CATH URET 5FR 28IN CONE TIP (BALLOONS)
CATH URET 5FR 28IN OPEN ENDED (CATHETERS) ×3 IMPLANT
CATH URET 5FR 70CM CONE TIP (BALLOONS) IMPLANT
CLOTH BEACON ORANGE TIMEOUT ST (SAFETY) ×5 IMPLANT
DRAPE CAMERA CLOSED 9X96 (DRAPES) ×5 IMPLANT
ELECT REM PT RETURN 9FT ADLT (ELECTROSURGICAL)
ELECTRODE REM PT RTRN 9FT ADLT (ELECTROSURGICAL) IMPLANT
ELECTROHYDROLIC PROBE 9FR (MISCELLANEOUS) IMPLANT
FIBER LASER FLEXIVA 1000 (UROLOGICAL SUPPLIES) IMPLANT
FIBER LASER FLEXIVA 200 (UROLOGICAL SUPPLIES) IMPLANT
FIBER LASER FLEXIVA 365 (UROLOGICAL SUPPLIES) IMPLANT
FIBER LASER FLEXIVA 550 (UROLOGICAL SUPPLIES) IMPLANT
GLOVE BIOGEL M STER SZ 6 (GLOVE) ×3 IMPLANT
GLOVE BIOGEL PI IND STRL 6.5 (GLOVE) ×2 IMPLANT
GLOVE BIOGEL PI INDICATOR 6.5 (GLOVE) ×4
GLOVE SURG SS PI 8.0 STRL IVOR (GLOVE) ×5 IMPLANT
GOWN STRL NON-REIN LRG LVL3 (GOWN DISPOSABLE) ×5 IMPLANT
GOWN STRL REIN XL XLG (GOWN DISPOSABLE) ×1 IMPLANT
GOWN STRL REUS W/TWL LRG LVL3 (GOWN DISPOSABLE) ×3 IMPLANT
GOWN STRL REUS W/TWL XL LVL3 (GOWN DISPOSABLE) ×3 IMPLANT
GOWN XL W/COTTON TOWEL STD (GOWNS) ×5 IMPLANT
GUIDEWIRE 0.038 PTFE COATED (WIRE) IMPLANT
GUIDEWIRE ANG ZIPWIRE 038X150 (WIRE) IMPLANT
GUIDEWIRE STR DUAL SENSOR (WIRE) ×5 IMPLANT
IV NS 1000ML (IV SOLUTION) ×10
IV NS 1000ML BAXH (IV SOLUTION) ×2 IMPLANT
IV NS IRRIG 3000ML ARTHROMATIC (IV SOLUTION) IMPLANT
KIT BALLIN UROMAX 15FX10 (LABEL) IMPLANT
KIT BALLN UROMAX 15FX4 (MISCELLANEOUS) IMPLANT
KIT BALLN UROMAX 26 75X4 (MISCELLANEOUS)
PACK CYSTOSCOPY (CUSTOM PROCEDURE TRAY) ×5 IMPLANT
PROBE LITHO 3.3FR 2137.235 (UROLOGICAL SUPPLIES) IMPLANT
PROBE LITHO 5.0FR 2137.1505 (MISCELLANEOUS) IMPLANT
SET HIGH PRES BAL DIL (LABEL)
SHEATH ACCESS URETERAL 38CM (SHEATH) IMPLANT
SHEATH ACCESS URETERAL 54CM (SHEATH) IMPLANT
SHEATH URET ACCESS 12FR/35CM (UROLOGICAL SUPPLIES) IMPLANT
SHEATH URET ACCESS 12FR/55CM (UROLOGICAL SUPPLIES) IMPLANT
WATER STERILE IRR 500ML POUR (IV SOLUTION) ×4 IMPLANT

## 2013-10-13 NOTE — Anesthesia Postprocedure Evaluation (Signed)
Anesthesia Post Note  Patient: Chad Gomez  Procedure(s) Performed: Procedure(s) (LRB): CYSTOSCOPY WITH LITHOLAPAXY, removal of urethral stone with basket (Right) CYSTOSCOPY WITH RETROGRADE PYELOGRAM (Bilateral)  Anesthesia type: General  Patient location: PACU  Post pain: Pain level controlled  Post assessment: Post-op Vital signs reviewed  Last Vitals: BP 117/70  Pulse 72  Temp(Src) 36.3 C (Oral)  Resp 17  Ht 5\' 10"  (1.778 m)  Wt 170 lb (77.111 kg)  BMI 24.39 kg/m2  SpO2 98%  Post vital signs: Reviewed  Level of consciousness: sedated  Complications: No apparent anesthesia complications

## 2013-10-13 NOTE — Anesthesia Procedure Notes (Signed)
Procedure Name: LMA Insertion Date/Time: 10/13/2013 7:36 AM Performed by: Bethena Roys T Pre-anesthesia Checklist: Patient identified, Emergency Drugs available, Suction available and Patient being monitored Patient Re-evaluated:Patient Re-evaluated prior to inductionOxygen Delivery Method: Circle System Utilized Preoxygenation: Pre-oxygenation with 100% oxygen Intubation Type: IV induction Ventilation: Mask ventilation without difficulty LMA: LMA with gastric port inserted LMA Size: 5.0 Number of attempts: 1 Placement Confirmation: positive ETCO2 Tube secured with: Tape Dental Injury: Teeth and Oropharynx as per pre-operative assessment

## 2013-10-13 NOTE — Interval H&P Note (Signed)
History and Physical Interval Note:  The right ureteral stone may have moved into the bladder on today's KUB.   10/13/2013 7:24 AM  Chad Gomez  has presented today for surgery, with the diagnosis of Bladder and Right Ureteral Stones  The various methods of treatment have been discussed with the patient and family. After consideration of risks, benefits and other options for treatment, the patient has consented to  Procedure(s) with comments: CYSTOSCOPY WITH LITHOLAPAXY (Right) CYSTOSCOPY WITH URETEROSCOPY AND STENT PLACEMENT (Right) - POSSIBLE STENT PLACEMENT HOLMIUM LASER APPLICATION (Right) as a surgical intervention .  The patient's history has been reviewed, patient examined, no change in status, stable for surgery.  I have reviewed the patient's chart and labs.  Questions were answered to the patient's satisfaction.     Irine Seal

## 2013-10-13 NOTE — Anesthesia Preprocedure Evaluation (Signed)
Anesthesia Evaluation  Patient identified by MRN, date of birth, ID band Patient awake  General Assessment Comment:.  Hypertension     .  Iron deficiency     .  History of bladder stone     .  Bladder stone     .  Right ureteral stone     .  Enlarged prostate     .  Frequency of urination     .  Nocturia     .  Melanoma         benign removed from back of left ear    .  Right arm fracture     Reviewed: Allergy & Precautions, H&P , NPO status , Patient's Chart, lab work & pertinent test results  History of Anesthesia Complications Negative for: history of anesthetic complications  Airway Mallampati: II TM Distance: >3 FB Neck ROM: Full    Dental  (+) Edentulous Upper, Edentulous Lower   Pulmonary Current Smoker,  breath sounds clear to auscultation  Pulmonary exam normal       Cardiovascular Exercise Tolerance: Good hypertension, Pt. on medications Rhythm:Regular Rate:Normal     Neuro/Psych negative neurological ROS  negative psych ROS   GI/Hepatic negative GI ROS, Neg liver ROS,   Endo/Other  negative endocrine ROS  Renal/GU negative Renal ROS     Musculoskeletal negative musculoskeletal ROS (+)   Abdominal   Peds  Hematology negative hematology ROS (+)   Anesthesia Other Findings   Reproductive/Obstetrics                           Anesthesia Physical  Anesthesia Plan  ASA: II  Anesthesia Plan: General   Post-op Pain Management:    Induction: Intravenous  Airway Management Planned: LMA  Additional Equipment:   Intra-op Plan:   Post-operative Plan: Extubation in OR  Informed Consent: I have reviewed the patients History and Physical, chart, labs and discussed the procedure including the risks, benefits and alternatives for the proposed anesthesia with the patient or authorized representative who has indicated his/her understanding and acceptance.   Dental advisory  given  Plan Discussed with: CRNA  Anesthesia Plan Comments:         Anesthesia Quick Evaluation

## 2013-10-13 NOTE — Discharge Instructions (Signed)
Cystoscopy, Care After °Refer to this sheet in the next few weeks. These instructions provide you with information on caring for yourself after your procedure. Your caregiver may also give you more specific instructions. Your treatment has been planned according to current medical practices, but problems sometimes occur. Call your caregiver if you have any problems or questions after your procedure. °HOME CARE INSTRUCTIONS  °Things you can do to ease any discomfort after your procedure include: °· Drinking enough water and fluids to keep your urine clear or pale yellow. °· Taking a warm bath to relieve any burning feelings. °SEEK IMMEDIATE MEDICAL CARE IF:  °· You have an increase in blood in your urine. °· You notice blood clots in your urine. °· You have difficulty passing urine. °· You have the chills. °· You have abdominal pain. °· You have a fever or persistent symptoms for more than 2 3 days. °· You have a fever and your symptoms suddenly get worse. °MAKE SURE YOU:  °· Understand these instructions. °· Will watch your condition. °· Will get help right away if you are not doing well or get worse. °Document Released: 12/27/2004 Document Revised: 02/09/2013 Document Reviewed: 12/01/2011 °Exit[employer]® Patient Information ©2014 [employer]. ° °Post Anesthesia Home Care Instructions ° °Activity: °Get plenty of rest for the remainder of the day. A responsible adult should stay with you for 24 hours following the procedure.  °For the next 24 hours, DO NOT: °-Drive a car °-Operate machinery °-Drink alcoholic beverages °-Take any medication unless instructed by your physician °-Make any legal decisions or sign important papers. ° °Meals: °Start with liquid foods such as gelatin or soup. Progress to regular foods as tolerated. Avoid greasy, spicy, heavy foods. If nausea and/or vomiting occur, drink only clear liquids until the nausea and/or vomiting subsides. Call your physician if vomiting continues. ° °Special  Instructions/Symptoms: °Your throat may feel dry or sore from the anesthesia or the breathing tube placed in your throat during surgery. If this causes discomfort, gargle with warm salt water. The discomfort should disappear within 24 hours. ° °

## 2013-10-13 NOTE — Brief Op Note (Signed)
10/13/2013  8:00 AM  PATIENT:  Chad Gomez  68 y.o. male  PRE-OPERATIVE DIAGNOSIS:  Bladder and Right Ureteral Stones  POST-OPERATIVE DIAGNOSIS:  Bladder and Right Ureteral Stones  PROCEDURE:  Procedure(s): CYSTOSCOPY WITH  removal of urethral stone with basket (Right) CYSTOSCOPY WITH RETROGRADE PYELOGRAM (Bilateral) CYSTOSCOPY with bladder stone removal.   SURGEON:  Surgeon(s) and Role:    * Irine Seal, MD - Primary  PHYSICIAN ASSISTANT:   ASSISTANTS: none   ANESTHESIA:   general  EBL:  Total I/O In: 200 [I.V.:200] Out: -   BLOOD ADMINISTERED:none  DRAINS: none   LOCAL MEDICATIONS USED:  LIDOCAINE   SPECIMEN:  Source of Specimen:  stones from urethra and bladder  DISPOSITION OF SPECIMEN:  to family  COUNTS:  YES  TOURNIQUET:  * No tourniquets in log *  DICTATION: .Other Dictation: Dictation Number [id number]  PLAN OF CARE: Discharge to home after PACU  PATIENT DISPOSITION:  PACU - hemodynamically stable.   Delay start of Pharmacological VTE agent (>24hrs) due to surgical blood loss or risk of bleeding: not applicable

## 2013-10-13 NOTE — Transfer of Care (Signed)
Immediate Anesthesia Transfer of Care Note  Patient: Chad Gomez  Procedure(s) Performed: Procedure(s): CYSTOSCOPY WITH LITHOLAPAXY, removal of urethral stone with basket (Right) CYSTOSCOPY WITH RETROGRADE PYELOGRAM (Bilateral)  Patient Location: PACU  Anesthesia Type:General  Level of Consciousness: sedated  Airway & Oxygen Therapy: Patient Spontanous Breathing and Patient connected to nasal cannula oxygen  Post-op Assessment: Report given to PACU RN  Post vital signs: Reviewed and stable  Complications: No apparent anesthesia complications

## 2013-10-14 ENCOUNTER — Encounter (HOSPITAL_BASED_OUTPATIENT_CLINIC_OR_DEPARTMENT_OTHER): Payer: Self-pay | Admitting: Urology

## 2013-10-14 NOTE — Op Note (Signed)
Chad Gomez, Chad Gomez              ACCOUNT NO.:  [id number]  MEDICAL RECORD NO.:  [id number]  LOCATION:                                 FACILITY:  PHYSICIAN:  Marshall Cork. Jeffie Pollock, M.D.    DATE OF BIRTH:  1945/08/03  DATE OF PROCEDURE:  10/13/2013 DATE OF DISCHARGE:                              OPERATIVE REPORT   PROCEDURE: 1. Cystoscopy with basket extraction of urethral stone. 2. Basket extraction of 2 bladder stones. 3. Bilateral retrograde pyelograms with interpretation.  PREOPERATIVE DIAGNOSIS:  Right distal ureteral stone and bladder stone.  POSTOPERATIVE DIAGNOSIS:  Urethral stone and 2 bladder stones.  SURGEON:  Marshall Cork. Jeffie Pollock, M.D.  ANESTHESIA:  General.  SPECIMEN:  Stones from bladder and urethra.  DRAINS:  None.  COMPLICATIONS:  None.  INDICATIONS:  Chad Gomez is a 68 year old white male with a history of recurrent urolithiasis who has been found to have a right distal ureteral stone.  It is approximately 8 mm in a similar size bladder stone.  These had been present for the last several months.  He had intervening issues that precluded intervention until now.  He is to undergo cystoscopy with removal of the bladder stone and right ureteroscopic stone extraction.  FINDINGS OF PROCEDURE:  He was given Cipro.  He was taken to the operating room where general anesthetic was induced.  He was placed in lithotomy position and fitted with PAS hose.  A B and O suppository was placed.  His perineum and genitalia were prepped with Betadine solution. He was draped in usual sterile fashion.  Cystoscopy was performed using a 22-French scope and 12-degree lens. Examination revealed a normal urethra.  The external sphincter was intact.  The prostatic urethra was approximately 3 cm in length with trilobar hyperplasia.  There was a small stone actually in the prostatic urethra.  This was removed with the basket.  Examination of bladder revealed moderate trabeculation.  There was  a small diverticulum on the dome of the bladder.  The ureteral orifices were unremarkable.  There were 2 stones at the base of the bladder which appeared to measure about 8 mm piece, and on the preoperative KUB today, the previously located right ureteral stone appeared to potentially have moved into the bladder and this was confirmed.  Once the stones were identified, they were grasped with a Nitinol basket and removed without difficulty, intact.  Some residual small stone debris was evacuated from the bladder as well.  At this point, a spot film was obtained and there was a suggestion of a possible stone in the left proximal ureter, elected to do retrograde pyelography.  Left retrograde pyelogram demonstrated the normal ureter and intrarenal collecting system.  For completeness, a right retrograde pyelogram was performed.  The right retrograde pyelogram also confirmed the normal ureter and intrarenal collecting system.  The bladder was drained.  The cystoscope was removed.  The urethra was instilled with 10 mL of 2% lidocaine jelly.  The patient was taken down from the lithotomy position.  His anesthetic was reversed.  He was moved to recovery room in stable condition.  His stones were given to his wife to bring to the office  for analysis.  There were no complications.     Marshall Cork. Jeffie Pollock, M.D.     JJW/MEDQ  D:  10/13/2013  T:  10/13/2013  Job:  [id number]

## 2014-02-06 ENCOUNTER — Encounter: Payer: Self-pay | Admitting: *Deleted

## 2014-02-07 ENCOUNTER — Ambulatory Visit (INDEPENDENT_AMBULATORY_CARE_PROVIDER_SITE_OTHER): Payer: BC Managed Care – PPO | Admitting: Neurology

## 2014-02-07 ENCOUNTER — Encounter: Payer: Self-pay | Admitting: Neurology

## 2014-02-07 VITALS — BP 154/95 | HR 81 | Ht 69.5 in | Wt 181.0 lb

## 2014-02-07 DIAGNOSIS — G5620 Lesion of ulnar nerve, unspecified upper limb: Secondary | ICD-10-CM

## 2014-02-07 DIAGNOSIS — F17200 Nicotine dependence, unspecified, uncomplicated: Secondary | ICD-10-CM | POA: Insufficient documentation

## 2014-02-07 DIAGNOSIS — I10 Essential (primary) hypertension: Secondary | ICD-10-CM | POA: Insufficient documentation

## 2014-02-07 DIAGNOSIS — C439 Malignant melanoma of skin, unspecified: Secondary | ICD-10-CM | POA: Insufficient documentation

## 2014-02-07 DIAGNOSIS — G5623 Lesion of ulnar nerve, bilateral upper limbs: Secondary | ICD-10-CM

## 2014-02-07 DIAGNOSIS — J309 Allergic rhinitis, unspecified: Secondary | ICD-10-CM | POA: Insufficient documentation

## 2014-02-07 DIAGNOSIS — N200 Calculus of kidney: Secondary | ICD-10-CM | POA: Insufficient documentation

## 2014-02-07 NOTE — Progress Notes (Signed)
[facility]    Provider:  Dr Jaynee Eagles Referring Provider: Thea Silversmith, Dianna Rossetti, MD Primary Care Physician:  Thurman Coyer, MD  CC:  Pain and tingling in the fingers and arms  HPI:  Chad Gomez is a 68 y.o. male here as a referral from Dr. Thea Silversmith for Pain and tingling in the fingers and arms  68 year old right-handed male here for evaluation of tingling in the right fingers with pain in the elbows and upper arms. 3 months ago started having tingling in mostly left digits 3-5 and sometimes in the thumb. Not sure where it started. Has joint pain in the wrist and elbow. Works at [employer] in [employer] and does a lot of heavy lifting, banging elbows all the time. Also has tightness of the forearm. Elbow is very sensitive. Also with some twisting sensation in the upper arm. Has neck pain, stiffness for 20+ years not worsening. Tingling in the fingers worse in the morning, sleeps with elbows bent. Now the symptoms are starting to affect the right extremity as well. Also worse when leaning on elbows or when elbows are bent. The discomfort in the arms can get up to 8-9/10. Notices it more when not working but symptoms are off and on all day. No new neck pain but does have chronic neck stiffness. Most of the sensory problems are in digits 3-5 and of a tingling quality. It is getting worse, progressive. Has weakness in the grip, dropping cans. Never had MRI of the neck and is extremely claustrophobic. No numbness or tingling in the toes. No diabetes. Was at a party holding a bottle of champagne in one hand, tried to do a flip and fractured left elbow 30 years ago. No residual deficits.   Reviewed notes, labs and imaging from outside physicians, which showed: Prior fracture at the elbow treated conservatively,  Review of Systems: Patient complains of symptoms per HPI as well as the following symptoms joint pain, aching muscles, snoring and restless legs. Pertinent negatives per HPI.  Otherwise out of a complete 14 system review, and all other reviewed systems are negative. Denies diabetes.    History   Social History  . Marital Status: Married    Spouse Name: N/A    Number of Children: N/A  . Years of Education: N/A   Occupational History  . Not on file.   Social History Main Topics  . Smoking status: Current Every Day Smoker -- 1.00 packs/day for 50 years    Types: Cigarettes  . Smokeless tobacco: Never Used     Comment: PT HAS CUT DOWN TO 1PPD FROM 2 PPD LAST YR (2014)  . Alcohol Use: No  . Drug Use: No  . Sexual Activity: Not on file   Other Topics Concern  . Not on file   Social History Narrative   Patient is married with 2 children.   Patient is right handed.   Patient has some college education.   Patient does not drink caffeine.    Family History  Problem Relation Age of Onset  . Colon cancer Neg Hx     Past Medical History  Diagnosis Date  . Hypertension   . Iron deficiency   . History of bladder stone   . Frequency of urination   . Nocturia   . Seasonal allergies   . BPH (benign prostatic hypertrophy)   . Right ureteral stone   . History of melanoma excision     LEFT EAR  .  Bladder stone   . History of kidney stones   . History of colon polyps     08/2013   pre-canerous  . Complication of anesthesia     urinary retention  . Frequency of urination   . Weak urinary stream   . Wears glasses   . Full dentures     Past Surgical History  Procedure Laterality Date  . Cystolithalopaxy of bladder stone/ transrectal ultrasound prostate bx  08-19-2005  . Cystoscopy with litholapaxy Right 12/02/2012    Procedure: CYSTOSCOPY WITH stone extraction;  Surgeon: Malka So, MD;  Location: [facility];  Service: Urology;  Laterality: Right;  . Ureteroscopy Right 12/02/2012    Procedure: RIGHT URETEROSCOPY STONE EXTRACTION WITH  STENT PLACEMENT;  Surgeon: Malka So, MD;  Location: [facility];  Service:  Urology;  Laterality: Right;  . Holmium laser application Right 2/42/6834    Procedure: HOLMIUM LASER APPLICATION;  Surgeon: Malka So, MD;  Location: [facility];  Service: Urology;  Laterality: Right;  . Cystoscopy w/ retrogrades Right 12/02/2012    Procedure: CYSTOSCOPY WITH RETROGRADE PYELOGRAM;  Surgeon: Malka So, MD;  Location: [facility];  Service: Urology;  Laterality: Right;  . Nephrolithotomy Left 01/27/2013    Procedure: NEPHROLITHOTOMY PERCUTANEOUS FIRST LOOK;  Surgeon: Malka So, MD;  Location: [facility];  Service: Urology;  Laterality: Left;  Marland Kitchen Mohs surgery  1996    left ear  . Multiple tooth extractions  1994  . Mandible fracture surgery  1995  . Inguinal hernia repair Right 1954  . Colonoscopy w/ polypectomy  09-13-2013  . Cystoscopy with litholapaxy Right 10/13/2013    Procedure: CYSTOSCOPY WITH LITHOLAPAXY, removal of urethral stone with basket;  Surgeon: Irine Seal, MD;  Location: [facility];  Service: Urology;  Laterality: Right;  . Cystoscopy w/ retrogrades Bilateral 10/13/2013    Procedure: CYSTOSCOPY WITH RETROGRADE PYELOGRAM;  Surgeon: Irine Seal, MD;  Location: [facility];  Service: Urology;  Laterality: Bilateral;  . Lithotripsy  2007 & 02/2013    Current Outpatient Prescriptions  Medication Sig Dispense Refill  . amLODipine (NORVASC) 10 MG tablet Take 10 mg by mouth every morning.       . Cholecalciferol (VITAMIN D PO) Take by mouth.      . Ferrous Sulfate (IRON) 325 (65 FE) MG TABS Take 1 tablet by mouth every morning.      . fluticasone (FLONASE) 50 MCG/ACT nasal spray Place 2 sprays into the nose 2 (two) times daily.      Marland Kitchen lisinopril (PRINIVIL,ZESTRIL) 20 MG tablet Take 20 mg by mouth daily.      Marland Kitchen loratadine (CLARITIN) 10 MG tablet Take 10 mg by mouth daily.      . niacin 500 MG tablet Take 500 mg by mouth daily with breakfast.      . POTASSIUM CITRATE PO Take 180 mg by mouth 3 (three) times  daily.       No current facility-administered medications for this visit.    Allergies as of 02/07/2014 - Review Complete 02/07/2014  Allergen Reaction Noted  . Amoxicillin Other (See Comments) 11/26/2012  . Ibuprofen Swelling 11/26/2012  . Naproxen Swelling 11/26/2012  . Tape Other (See Comments) 09/02/2013    Vitals: BP 154/95  Pulse 81  Ht 5' 9.5" (1.765 m)  Wt 181 lb (82.101 kg)  BMI 26.35 kg/m2 Last Weight:  Wt Readings from Last 1 Encounters:  02/07/14 181 lb (  82.101 kg)   Last Height:   Ht Readings from Last 1 Encounters:  02/07/14 5' 9.5" (1.765 m)     Physical exam: Exam: Gen: NAD, conversant Eyes: anicteric sclerae, moist conjunctivae HENT: Atraumatic, oropharynx clear Neck: Trachea midline; supple,  Lungs: CTA                        CV: RRR Abdomen:  Extremities: No peripheral edema  Skin: Normal temperature, no rash,  Psych: Appropriate affect, pleasant  Neuro: Detailed Neurologic Exam  Speech:    Speech is normal; fluent and spontaneous with normal comprehension.  Cognition:    The patient is oriented to person, place, and time; memory intact; language fluent; normal attention, concentration, and fund of knowledge.   Cranial Nerves:    The pupils are equal, round, and reactive to light. The fundi are normal and spontaneous venous pulsations are present. Visual fields are full to finger confrontation. Extraocular movements are intact. Trigeminal sensation is intact and the muscles of mastication are normal. The face is symmetric. The palate elevates in the midline. Voice is normal. Shoulder shrug is normal. The tongue has normal motion without fasciculations. protrusion, tongue deviates to right on protrusion, tongue deviates to left at rest, tongue deviates to right at rest, tongue atrophy, and tongue fasciculations.     Coordination:    Normal finger to nose and heel to shin. Normal rapid alternating movements.   Gait:    Heel-toe and tandem  gait are normal.    Motor Observation:    No asymmetry, no atrophy, and no involuntary movements noted.  Tone:    Normal muscle tone.   Posture:    Posture is normal. normal erect  Strength: Weakness of the intrinsic ulnar muscles of the hand as well as FDP (3,4). Otherwise strength is V/V in the upper and lower limbs.    Sensory: Impaired in an ulnar distribution in the hand. +Tinel's at the left elbow. Negative Tinel's sign at the right elbow, Negative Tinel's sign at the bilateral wrists.     Reflex Exam:  DTR's:    Deep tendon reflexes in the upper and lower extremities are normal bilaterally.   Toes:    The toes are downgoing bilaterally.  upgoing right, downgoing right, equivocal right, upgoing left, downgoing left, and equivocal left.   Clonus:    Clonus is absent. >3 beats right ankle clonus and >3 beats left ankle clonus.   Other Reflexes:    + Hoffman's right, - Hoffman's right, + Tromner's right, - Tromner's right, right-sided crossed adductor response, and right cremasteric reflex absent.       Assessment/Plan: 68 year old male with a PMHx of left elbow fracture who is here as a new evaluation of worsening tingling in the left fingers mostly digits 3-5 and occ the thumb. Has 20+ years of neck pain which is not worsening. Complains of wrist and elbow tenderness as well as some upper arm tightness. Right arm is starting to exhibit similar symptoms. Exacerbated by bending elbows and worse after sleeping with arms bent. Neuro exam significant with left interinsic hand weakness of the ulnar muscles as well as FDP 3,4 and sensory changes in an ulnar distribution. Likely entrapment at the elbows but should do an EMG/NCS to differentiate between this and cervical radiculopathy. Conservative measures: elbow bracing at night, avoiding bent elbows. Will see patient back in one-two weeks for emg/ncs. Pending results and conservative measures will consider further imaging or  referral  to specialist.  Also counseled on smoking cessation. Patient will follow up with primary care.    Sarina Ill, MD  [facility] [address] [place], [place] [address]  Phone [phone] Fax [phone]

## 2014-02-07 NOTE — Patient Instructions (Signed)
Overall you are doing fairly well but I do want to suggest a few things today:   Remember to drink plenty of fluid, eat healthy meals and do not skip any meals. Try to eat protein with a every meal and eat a healthy snack such as fruit or nuts in between meals. Try to keep a regular sleep-wake schedule and try to exercise daily, particularly in the form of walking, 20-30 minutes a day, if you can.   As far as diagnostic testing: emg/ncs. Conservative measures such as elbow bracing at night and avoidance of elbow bending.   I would like to see you back in 2 weeks for emg/ncs, sooner if we need to. Please call us with any interim questions, concerns, problems, updates or refill requests.   Please also call us for any test results so we can go over those with you on the phone.  My clinical assistant and will answer any of your questions and relay your messages to me and also relay most of my messages to you.   Our phone number is [phone]. We also have an after hours call service for urgent matters and there is a physician on-call for urgent questions. For any emergencies you know to call 911 or go to the nearest emergency room

## 2014-03-21 ENCOUNTER — Inpatient Hospital Stay (HOSPITAL_COMMUNITY)
Admission: EM | Admit: 2014-03-21 | Discharge: 2014-03-22 | DRG: 694 | Disposition: A | Discharge status: Home / Self Care | Payer: BC Managed Care – PPO | Attending: Urology | Admitting: Urology

## 2014-03-21 ENCOUNTER — Emergency Department (HOSPITAL_COMMUNITY): Payer: BC Managed Care – PPO

## 2014-03-21 ENCOUNTER — Inpatient Hospital Stay (HOSPITAL_COMMUNITY): Payer: BC Managed Care – PPO | Admitting: Anesthesiology

## 2014-03-21 ENCOUNTER — Encounter (HOSPITAL_COMMUNITY): Payer: BC Managed Care – PPO | Admitting: Anesthesiology

## 2014-03-21 ENCOUNTER — Encounter (HOSPITAL_COMMUNITY): Payer: Self-pay | Admitting: Emergency Medicine

## 2014-03-21 ENCOUNTER — Encounter (HOSPITAL_COMMUNITY)
Admission: EM | Disposition: A | Discharge status: Home / Self Care | Payer: Self-pay | Source: Home / Self Care | Attending: Urology

## 2014-03-21 DIAGNOSIS — N132 Hydronephrosis with renal and ureteral calculous obstruction: Secondary | ICD-10-CM | POA: Diagnosis present

## 2014-03-21 DIAGNOSIS — N289 Disorder of kidney and ureter, unspecified: Secondary | ICD-10-CM

## 2014-03-21 DIAGNOSIS — F17200 Nicotine dependence, unspecified, uncomplicated: Secondary | ICD-10-CM | POA: Diagnosis present

## 2014-03-21 DIAGNOSIS — Z7982 Long term (current) use of aspirin: Secondary | ICD-10-CM | POA: Diagnosis not present

## 2014-03-21 DIAGNOSIS — Z85820 Personal history of malignant melanoma of skin: Secondary | ICD-10-CM | POA: Diagnosis not present

## 2014-03-21 DIAGNOSIS — N210 Calculus in bladder: Secondary | ICD-10-CM | POA: Diagnosis present

## 2014-03-21 DIAGNOSIS — I129 Hypertensive chronic kidney disease with stage 1 through stage 4 chronic kidney disease, or unspecified chronic kidney disease: Secondary | ICD-10-CM | POA: Diagnosis present

## 2014-03-21 DIAGNOSIS — N189 Chronic kidney disease, unspecified: Secondary | ICD-10-CM | POA: Diagnosis present

## 2014-03-21 DIAGNOSIS — N1330 Unspecified hydronephrosis: Secondary | ICD-10-CM | POA: Diagnosis present

## 2014-03-21 DIAGNOSIS — N401 Enlarged prostate with lower urinary tract symptoms: Secondary | ICD-10-CM | POA: Diagnosis present

## 2014-03-21 DIAGNOSIS — N201 Calculus of ureter: Secondary | ICD-10-CM | POA: Diagnosis present

## 2014-03-21 DIAGNOSIS — Z79899 Other long term (current) drug therapy: Secondary | ICD-10-CM | POA: Diagnosis not present

## 2014-03-21 DIAGNOSIS — D72829 Elevated white blood cell count, unspecified: Secondary | ICD-10-CM | POA: Diagnosis present

## 2014-03-21 DIAGNOSIS — N138 Other obstructive and reflux uropathy: Secondary | ICD-10-CM | POA: Diagnosis present

## 2014-03-21 DIAGNOSIS — N179 Acute kidney failure, unspecified: Secondary | ICD-10-CM | POA: Diagnosis present

## 2014-03-21 DIAGNOSIS — R109 Unspecified abdominal pain: Secondary | ICD-10-CM | POA: Diagnosis present

## 2014-03-21 HISTORY — PX: CYSTOSCOPY WITH RETROGRADE PYELOGRAM, URETEROSCOPY AND STENT PLACEMENT: SHX5789

## 2014-03-21 LAB — COMPREHENSIVE METABOLIC PANEL
ALK PHOS: 89 U/L (ref 39–117)
ALT: 11 U/L (ref 0–53)
ANION GAP: 12 (ref 5–15)
AST: 17 U/L (ref 0–37)
Albumin: 3.9 g/dL (ref 3.5–5.2)
BUN: 22 mg/dL (ref 6–23)
CHLORIDE: 102 meq/L (ref 96–112)
CO2: 25 mEq/L (ref 19–32)
CREATININE: 1.44 mg/dL — AB (ref 0.50–1.35)
Calcium: 10.4 mg/dL (ref 8.4–10.5)
GFR calc Af Amer: 57 mL/min — ABNORMAL LOW (ref >90)
GFR calc non Af Amer: 49 mL/min — ABNORMAL LOW (ref >90)
Glucose, Bld: 157 mg/dL — ABNORMAL HIGH (ref 70–99)
POTASSIUM: 4.5 meq/L (ref 3.7–5.3)
Sodium: 139 mEq/L (ref 137–147)
TOTAL PROTEIN: 6.7 g/dL (ref 6.0–8.3)
Total Bilirubin: 0.5 mg/dL (ref 0.3–1.2)

## 2014-03-21 LAB — CBC
HEMATOCRIT: 45.7 % (ref 39.0–52.0)
Hemoglobin: 15.6 g/dL (ref 13.0–17.0)
MCH: 30.5 pg (ref 26.0–34.0)
MCHC: 34.1 g/dL (ref 30.0–36.0)
MCV: 89.4 fL (ref 78.0–100.0)
Platelets: 217 10*3/uL (ref 150–400)
RBC: 5.11 MIL/uL (ref 4.22–5.81)
RDW: 12.5 % (ref 11.5–15.5)
WBC: 17 10*3/uL — AB (ref 4.0–10.5)

## 2014-03-21 LAB — URINALYSIS, ROUTINE W REFLEX MICROSCOPIC
Bilirubin Urine: NEGATIVE
GLUCOSE, UA: NEGATIVE mg/dL
Ketones, ur: NEGATIVE mg/dL
NITRITE: POSITIVE — AB
Protein, ur: NEGATIVE mg/dL
SPECIFIC GRAVITY, URINE: 1.012 (ref 1.005–1.030)
Urobilinogen, UA: 0.2 mg/dL (ref 0.0–1.0)
pH: 8 (ref 5.0–8.0)

## 2014-03-21 LAB — URINE MICROSCOPIC-ADD ON

## 2014-03-21 LAB — SURGICAL PCR SCREEN
MRSA, PCR: NEGATIVE
STAPHYLOCOCCUS AUREUS: POSITIVE — AB

## 2014-03-21 SURGERY — CYSTOURETEROSCOPY, WITH RETROGRADE PYELOGRAM AND STENT INSERTION
Anesthesia: General | Site: Ureter | Laterality: Left

## 2014-03-21 MED ORDER — PROMETHAZINE HCL 25 MG/ML IJ SOLN: 6.2500 mg | INTRAMUSCULAR | Status: DC | PRN

## 2014-03-21 MED ORDER — IOHEXOL 300 MG/ML  SOLN
INTRAMUSCULAR | Status: DC | PRN
  Administered 2014-03-21: 25 mL via INTRAVENOUS

## 2014-03-21 MED ORDER — DEXTROSE 5 % IV SOLN
1.0000 g | Freq: Once | INTRAVENOUS | Status: AC
  Administered 2014-03-21: 1 g via INTRAVENOUS
  Filled 2014-03-21: qty 10

## 2014-03-21 MED ORDER — LIDOCAINE HCL (CARDIAC) 20 MG/ML IV SOLN
INTRAVENOUS | Status: AC
Start: 2014-03-21 — End: 2014-03-21
  Filled 2014-03-21: qty 5

## 2014-03-21 MED ORDER — MEPERIDINE HCL 50 MG/ML IJ SOLN: 6.2500 mg | INTRAMUSCULAR | Status: DC | PRN

## 2014-03-21 MED ORDER — ONDANSETRON HCL 4 MG/2ML IJ SOLN: 4.0000 mg | INTRAMUSCULAR | Status: DC | PRN

## 2014-03-21 MED ORDER — HYDROMORPHONE HCL 1 MG/ML IJ SOLN: 0.2500 mg | INTRAMUSCULAR | Status: DC | PRN

## 2014-03-21 MED ORDER — LACTATED RINGERS IV SOLN
INTRAVENOUS | Status: DC | PRN
  Administered 2014-03-21: 21:00:00 via INTRAVENOUS

## 2014-03-21 MED ORDER — HYDROMORPHONE HCL 1 MG/ML IJ SOLN
1.0000 mg | Freq: Once | INTRAMUSCULAR | Status: AC
  Administered 2014-03-21: 1 mg via INTRAVENOUS
  Filled 2014-03-21: qty 1

## 2014-03-21 MED ORDER — SENNA 8.6 MG PO TABS: 1.0000 | ORAL_TABLET | Freq: Two times a day (BID) | ORAL | Status: DC

## 2014-03-21 MED ORDER — DOCUSATE SODIUM 100 MG PO CAPS
100.0000 mg | ORAL_CAPSULE | Freq: Two times a day (BID) | ORAL | Status: DC
  Filled 2014-03-21 (×3): qty 1

## 2014-03-21 MED ORDER — OXYCODONE HCL 5 MG PO TABS: 5.0000 mg | ORAL_TABLET | Freq: Once | ORAL | Status: DC | PRN

## 2014-03-21 MED ORDER — SULFAMETHOXAZOLE-TMP DS 800-160 MG PO TABS
1.0000 | ORAL_TABLET | Freq: Two times a day (BID) | ORAL | Status: DC
  Filled 2014-03-21 (×3): qty 1

## 2014-03-21 MED ORDER — AMLODIPINE BESYLATE 10 MG PO TABS
10.0000 mg | ORAL_TABLET | Freq: Every morning | ORAL | Status: DC
  Administered 2014-03-21: 10 mg via ORAL
  Filled 2014-03-21: qty 1

## 2014-03-21 MED ORDER — PROPOFOL 10 MG/ML IV BOLUS
INTRAVENOUS | Status: AC
  Filled 2014-03-21: qty 20

## 2014-03-21 MED ORDER — CIPROFLOXACIN IN D5W 400 MG/200ML IV SOLN
400.0000 mg | Freq: Two times a day (BID) | INTRAVENOUS | Status: DC
  Administered 2014-03-22: 400 mg via INTRAVENOUS
  Filled 2014-03-21 (×2): qty 200

## 2014-03-21 MED ORDER — MORPHINE SULFATE 4 MG/ML IJ SOLN
6.0000 mg | Freq: Once | INTRAMUSCULAR | Status: AC
  Administered 2014-03-21: 6 mg via INTRAVENOUS
  Filled 2014-03-21: qty 2

## 2014-03-21 MED ORDER — OXYCODONE HCL 5 MG/5ML PO SOLN: 5.0000 mg | Freq: Once | ORAL | Status: DC | PRN

## 2014-03-21 MED ORDER — ONDANSETRON HCL 4 MG/2ML IJ SOLN
INTRAMUSCULAR | Status: DC | PRN
  Administered 2014-03-21: 4 mg via INTRAVENOUS

## 2014-03-21 MED ORDER — ALBUTEROL SULFATE HFA 108 (90 BASE) MCG/ACT IN AERS
INHALATION_SPRAY | RESPIRATORY_TRACT | Status: DC | PRN
  Administered 2014-03-21: 6 via RESPIRATORY_TRACT

## 2014-03-21 MED ORDER — FENTANYL CITRATE 0.05 MG/ML IJ SOLN
INTRAMUSCULAR | Status: AC
  Filled 2014-03-21: qty 2

## 2014-03-21 MED ORDER — HYDROMORPHONE HCL 1 MG/ML IJ SOLN
0.5000 mg | INTRAMUSCULAR | Status: DC | PRN
  Administered 2014-03-21: 1 mg via INTRAVENOUS
  Administered 2014-03-21 – 2014-03-22 (×2): 0.5 mg via INTRAVENOUS
  Filled 2014-03-21 (×3): qty 1

## 2014-03-21 MED ORDER — FENTANYL CITRATE 0.05 MG/ML IJ SOLN
INTRAMUSCULAR | Status: DC | PRN
  Administered 2014-03-21: 50 ug via INTRAVENOUS

## 2014-03-21 MED ORDER — ONDANSETRON HCL 4 MG/2ML IJ SOLN
INTRAMUSCULAR | Status: AC
Start: 2014-03-21 — End: 2014-03-21
  Filled 2014-03-21: qty 2

## 2014-03-21 MED ORDER — LIDOCAINE HCL (PF) 2 % IJ SOLN
INTRAMUSCULAR | Status: DC | PRN
  Administered 2014-03-21: 75 mg via INTRADERMAL

## 2014-03-21 MED ORDER — METOPROLOL TARTRATE 1 MG/ML IV SOLN
INTRAVENOUS | Status: AC
  Filled 2014-03-21: qty 5

## 2014-03-21 MED ORDER — OXYCODONE HCL 5 MG PO TABS
5.0000 mg | ORAL_TABLET | ORAL | Status: DC | PRN
  Administered 2014-03-22: 5 mg via ORAL
  Filled 2014-03-21: qty 1

## 2014-03-21 MED ORDER — PROPOFOL 10 MG/ML IV BOLUS
INTRAVENOUS | Status: DC | PRN
  Administered 2014-03-21: 150 mg via INTRAVENOUS

## 2014-03-21 MED ORDER — SODIUM CHLORIDE 0.9 % IR SOLN
Status: DC | PRN
  Administered 2014-03-21: 4000 mL

## 2014-03-21 MED ORDER — ALBUTEROL SULFATE HFA 108 (90 BASE) MCG/ACT IN AERS
INHALATION_SPRAY | RESPIRATORY_TRACT | Status: AC
  Filled 2014-03-21: qty 6.7

## 2014-03-21 MED ORDER — SODIUM CHLORIDE 0.9 % IV BOLUS (SEPSIS)
1000.0000 mL | Freq: Once | INTRAVENOUS | Status: AC
  Administered 2014-03-21: 1000 mL via INTRAVENOUS

## 2014-03-21 MED ORDER — KCL IN DEXTROSE-NACL 10-5-0.45 MEQ/L-%-% IV SOLN
INTRAVENOUS | Status: DC
  Administered 2014-03-21: 20:00:00 via INTRAVENOUS
  Filled 2014-03-21 (×3): qty 1000

## 2014-03-21 MED ORDER — PNEUMOCOCCAL VAC POLYVALENT 25 MCG/0.5ML IJ INJ
0.5000 mL | INJECTION | Freq: Once | INTRAMUSCULAR | Status: DC
  Filled 2014-03-21 (×2): qty 0.5

## 2014-03-21 SURGICAL SUPPLY — 24 items
BAG URINE DRAINAGE (UROLOGICAL SUPPLIES) ×1 IMPLANT
BASKET LASER NITINOL 1.9FR (BASKET) IMPLANT
BASKET STNLS GEMINI 4WIRE 3FR (BASKET) IMPLANT
BASKET ZERO TIP NITINOL 2.4FR (BASKET) IMPLANT
BSKT STON RTRVL 120 1.9FR (BASKET)
BSKT STON RTRVL GEM 120X11 3FR (BASKET)
BSKT STON RTRVL ZERO TP 2.4FR (BASKET)
CATH INTERMIT  6FR 70CM (CATHETERS) ×3 IMPLANT
CLOTH BEACON ORANGE TIMEOUT ST (SAFETY) ×3 IMPLANT
DRAPE CAMERA CLOSED 9X96 (DRAPES) ×3 IMPLANT
ELECT REM PT RETURN 9FT ADLT (ELECTROSURGICAL)
ELECTRODE REM PT RTRN 9FT ADLT (ELECTROSURGICAL) IMPLANT
FIBER LASER FLEXIVA 200 (UROLOGICAL SUPPLIES) IMPLANT
FIBER LASER FLEXIVA 365 (UROLOGICAL SUPPLIES) IMPLANT
GLOVE BIOGEL M STRL SZ7.5 (GLOVE) ×9 IMPLANT
GOWN STRL REUS W/TWL LRG LVL3 (GOWN DISPOSABLE) ×5 IMPLANT
GUIDEWIRE ANG ZIPWIRE 038X150 (WIRE) ×3 IMPLANT
GUIDEWIRE STR DUAL SENSOR (WIRE) IMPLANT
IV NS IRRIG 3000ML ARTHROMATIC (IV SOLUTION) ×1 IMPLANT
PACK CYSTO (CUSTOM PROCEDURE TRAY) ×3 IMPLANT
STENT CONTOUR 6FRX26X.038 (STENTS) ×2 IMPLANT
SYRINGE 10CC LL (SYRINGE) ×2 IMPLANT
SYRINGE IRR TOOMEY STRL 70CC (SYRINGE) IMPLANT
TUBE FEEDING 8FR 16IN STR KANG (MISCELLANEOUS) ×3 IMPLANT

## 2014-03-21 NOTE — ED Notes (Signed)
Patient trying to urinate

## 2014-03-21 NOTE — Anesthesia Preprocedure Evaluation (Addendum)
Anesthesia Evaluation  Patient identified by MRN, date of birth, ID band Patient awake  General Assessment Comment:.  Hypertension     .  Iron deficiency     .  History of bladder stone     .  Bladder stone     .  Right ureteral stone     .  Enlarged prostate     .  Frequency of urination     .  Nocturia     .  Melanoma         benign removed from back of left ear    .  Right arm fracture     Reviewed: Allergy & Precautions, H&P , NPO status , Patient's Chart, lab work & pertinent test results  History of Anesthesia Complications Negative for: history of anesthetic complications  Airway Mallampati: II TM Distance: >3 FB Neck ROM: Full    Dental  (+) Edentulous Upper, Edentulous Lower   Pulmonary Current Smoker,  breath sounds clear to auscultation  Pulmonary exam normal       Cardiovascular Exercise Tolerance: Good hypertension, Pt. on medications Rhythm:Regular Rate:Normal     Neuro/Psych negative neurological ROS  negative psych ROS   GI/Hepatic negative GI ROS, Neg liver ROS,   Endo/Other  negative endocrine ROS  Renal/GU Renal disease     Musculoskeletal negative musculoskeletal ROS (+)   Abdominal   Peds  Hematology negative hematology ROS (+)   Anesthesia Other Findings   Reproductive/Obstetrics                          Anesthesia Physical  Anesthesia Plan  ASA: II  Anesthesia Plan: General   Post-op Pain Management:    Induction: Intravenous  Airway Management Planned: LMA  Additional Equipment:   Intra-op Plan:   Post-operative Plan: Extubation in OR  Informed Consent: I have reviewed the patients History and Physical, chart, labs and discussed the procedure including the risks, benefits and alternatives for the proposed anesthesia with the patient or authorized representative who has indicated his/her understanding and acceptance.   Dental advisory  given  Plan Discussed with: CRNA  Anesthesia Plan Comments:         Anesthesia Quick Evaluation

## 2014-03-21 NOTE — ED Provider Notes (Signed)
CSN: [id number]     Arrival date & time 03/21/14  1237 History   First MD Initiated Contact with Patient 03/21/14 1241     Chief Complaint  Patient presents with  . Flank Pain     (Consider location/radiation/quality/duration/timing/severity/associated sxs/prior Treatment) Patient is a 68 y.o. male presenting with flank pain. The history is provided by the patient.  Flank Pain This is a new problem. The current episode started 3 to 5 hours ago. The problem occurs constantly. The problem has not changed since onset.Associated symptoms include abdominal pain. Pertinent negatives include no chest pain and no shortness of breath. Nothing aggravates the symptoms. Nothing relieves the symptoms.    Past Medical History  Diagnosis Date  . Hypertension   . Iron deficiency   . History of bladder stone   . Frequency of urination   . Nocturia   . Seasonal allergies   . BPH (benign prostatic hypertrophy)   . Right ureteral stone   . History of melanoma excision     LEFT EAR  . Bladder stone   . History of kidney stones   . History of colon polyps     08/2013   pre-canerous  . Complication of anesthesia     urinary retention  . Frequency of urination   . Weak urinary stream   . Wears glasses   . Full dentures   . Chronic kidney disease    Past Surgical History  Procedure Laterality Date  . Cystolithalopaxy of bladder stone/ transrectal ultrasound prostate bx  08-19-2005  . Cystoscopy with litholapaxy Right 12/02/2012    Procedure: CYSTOSCOPY WITH stone extraction;  Surgeon: Malka So, MD;  Location: [facility];  Service: Urology;  Laterality: Right;  . Ureteroscopy Right 12/02/2012    Procedure: RIGHT URETEROSCOPY STONE EXTRACTION WITH  STENT PLACEMENT;  Surgeon: Malka So, MD;  Location: [facility];  Service: Urology;  Laterality: Right;  . Holmium laser application Right 3/71/0626    Procedure: HOLMIUM LASER APPLICATION;  Surgeon: Malka So,  MD;  Location: [facility];  Service: Urology;  Laterality: Right;  . Cystoscopy w/ retrogrades Right 12/02/2012    Procedure: CYSTOSCOPY WITH RETROGRADE PYELOGRAM;  Surgeon: Malka So, MD;  Location: [facility];  Service: Urology;  Laterality: Right;  . Nephrolithotomy Left 01/27/2013    Procedure: NEPHROLITHOTOMY PERCUTANEOUS FIRST LOOK;  Surgeon: Malka So, MD;  Location: [facility];  Service: Urology;  Laterality: Left;  Marland Kitchen Mohs surgery  1996    left ear  . Multiple tooth extractions  1994  . Mandible fracture surgery  1995  . Inguinal hernia repair Right 1954  . Colonoscopy w/ polypectomy  09-13-2013  . Cystoscopy with litholapaxy Right 10/13/2013    Procedure: CYSTOSCOPY WITH LITHOLAPAXY, removal of urethral stone with basket;  Surgeon: Irine Seal, MD;  Location: [facility];  Service: Urology;  Laterality: Right;  . Cystoscopy w/ retrogrades Bilateral 10/13/2013    Procedure: CYSTOSCOPY WITH RETROGRADE PYELOGRAM;  Surgeon: Irine Seal, MD;  Location: [facility];  Service: Urology;  Laterality: Bilateral;  . Lithotripsy  2007 & 02/2013   Family History  Problem Relation Age of Onset  . Colon cancer Neg Hx   . Dementia Mother   . Dementia Father    History  Substance Use Topics  . Smoking status: Current Every Day Smoker -- 1.00 packs/day for 50 years    Types: Cigarettes  . Smokeless tobacco:  Never Used     Comment: PT HAS CUT DOWN TO 1PPD FROM 2 PPD LAST YR (2014)  . Alcohol Use: No    Review of Systems  Constitutional: Negative for fever and chills.  Respiratory: Negative for cough and shortness of breath.   Cardiovascular: Negative for chest pain and leg swelling.  Gastrointestinal: Positive for abdominal pain. Negative for vomiting.  Genitourinary: Positive for flank pain.  All other systems reviewed and are negative.     Allergies  Amoxicillin; Ibuprofen; Naproxen; and Tape  Home Medications   Prior to  Admission medications   Medication Sig Start Date End Date Taking? Authorizing Provider  amLODipine (NORVASC) 10 MG tablet Take 10 mg by mouth every morning.    Yes Historical Provider, MD  aspirin 325 MG tablet Take 325 mg by mouth daily.   Yes Historical Provider, MD  Cholecalciferol (VITAMIN D PO) Take 1 capsule by mouth daily.    Yes Historical Provider, MD  Cyanocobalamin (VITAMIN B-12 PO) Take 1 capsule by mouth daily.   Yes Historical Provider, MD  Ferrous Sulfate (IRON) 325 (65 FE) MG TABS Take 1 tablet by mouth every morning.   Yes Historical Provider, MD  fluticasone (FLONASE) 50 MCG/ACT nasal spray Place 2 sprays into the nose 2 (two) times daily.   Yes Historical Provider, MD  lisinopril (PRINIVIL,ZESTRIL) 20 MG tablet Take 20 mg by mouth daily.   Yes Historical Provider, MD  loratadine (CLARITIN) 10 MG tablet Take 10 mg by mouth daily.   Yes Historical Provider, MD  niacin 500 MG tablet Take 500 mg by mouth daily with breakfast.   Yes Historical Provider, MD  POTASSIUM CITRATE PO Take 180 mg by mouth 2 (two) times daily.    Yes Historical Provider, MD   BP 125/69  Pulse 65  Temp(Src) 97.5 F (36.4 C) (Oral)  Resp 18  SpO2 94% Physical Exam  Nursing note and vitals reviewed. Constitutional: He is oriented to person, place, and time. He appears well-developed and well-nourished. No distress.  HENT:  Head: Normocephalic and atraumatic.  Mouth/Throat: Oropharynx is clear and moist. No oropharyngeal exudate.  Eyes: EOM are normal. Pupils are equal, round, and reactive to light.  Neck: Normal range of motion. Neck supple.  Cardiovascular: Normal rate and regular rhythm.  Exam reveals no friction rub.   No murmur heard. Pulmonary/Chest: Effort normal and breath sounds normal. No respiratory distress. He has no wheezes. He has no rales.  Abdominal: Soft. He exhibits no distension. There is tenderness (L flank, central abdominal pain). There is no rebound.  Musculoskeletal: Normal  range of motion. He exhibits no edema.  Neurological: He is alert and oriented to person, place, and time.  Skin: No rash noted. He is not diaphoretic.    ED Course  Procedures (including critical care time) Labs Review Labs Reviewed  CBC  COMPREHENSIVE METABOLIC PANEL  URINALYSIS, ROUTINE W REFLEX MICROSCOPIC    Imaging Review Ct Renal Stone Study  03/21/2014   CLINICAL DATA:  68 year old male with left flank pain. Vomiting. Initial encounter.  EXAM: CT ABDOMEN AND PELVIS WITHOUT CONTRAST  TECHNIQUE: Multidetector CT imaging of the abdomen and pelvis was performed following the standard protocol without IV contrast.  COMPARISON:  CT Abdomen and Pelvis 11/22/2012 and earlier.  FINDINGS: Stable lung bases.  No pericardial or pleural effusion.  Osteopenia. Chronic changes in the spine including L5 pars fractures and mild grade 1 anterolisthesis. No acute osseous abnormality identified.  Extensive chronic Aortoiliac calcified atherosclerosis noted.  Stable infrarenal abdominal aortic aneurysm measuring up to 31 mm diameter.  No pelvic free fluid. Stable fact containing left inguinal hernia. Negative distal colon.  Redundant but otherwise negative more proximal colon. Normal appendix. No dilated small bowel. Decompressed stomach. Stable small hiatal hernia.  Chronic cholelithiasis. No pericholecystic inflammation. Stable liver with occasional sub cm low-density areas which appear benign. Negative non contrast spleen, pancreas, and adrenal glands. No abdominal free fluid.  Chronic right renal atrophy with large lower pole and smaller midpole calculi. Stable right ureter. No right perinephric stranding.  Inflamed and obstructed left kidney with moderate perinephric stranding and/or trace left para renal space fluid. Obstructing stone lodged at the left ureteropelvic junction measuring 17 x 9 x 9 mm. Small or nonobstructing left lower pole renal calculi. Superimposed left renal hilar vascular calcifications.  B on the obstructing stone, the left ureter is stable.  Multiple bulky stones within the bladder (series 2, image 75). Chronic impression upon the base of the bladder from the prostate, not significantly changed.  IMPRESSION: 1. Acute obstructive uropathy on the left with a bulky oval stone lodged at the left UPJ (9 x 9 x 17 mm). Possible forniceal rupture. 2. Bilateral nonobstructing nephrolithiasis. Multiple bulky stones within the bladder. Chronic right renal atrophy. 3. Stable infrarenal abdominal aortic aneurysm, 31 mm diameter. Recommend follow up by [place] in 3 years. This recommendation follows ACR consensus guidelines: White Paper of the ACR Incidental Findings Committee II on Vascular Findings. Joellyn Rued [id number]; 38:466-599. 4. Chronic cholelithiasis.   Electronically Signed   By: Lars Pinks M.D.   On: 03/21/2014 13:35     EKG Interpretation None      MDM   Final diagnoses:  Ureteral stone with hydronephrosis    84M with hx of recurrent nephrolithiasis presents with L flank pain. Began 3 hours ago, different pain than prior kidney stones. Radiates into remainder of abdomen. No dysuria. No fevers. No N/V/D. AFVSS here. L flank pain and central abdominal pain on palpation. Will give pain meds, scan. Scan with large stone, urine infected. Rocephin given. Dr. Tresa Moore with Urology consulted and will evaluate patient.     Evelina Bucy, MD 03/22/14 [phone]

## 2014-03-21 NOTE — H&P (Signed)
Chad Gomez is an 68 y.o. male.    Chief Complaint: Left Ureteral Stone, Leukocytosis, Bacteruria  HPI:   1 - Left Ureteral Stone  - left 17 mm UPJ stone with hydro and likely early forniceal rupture by CT stone on eval acute left flank pain.   2 - Bacteruria, Leukocytosis - UA in ER nit+ with many bacteria on gram stain, leukocytosis to 17k.   3 - Acute Renal Failure - CR 1.4 on ER labs up from baseline <0.9. Left stone with hydro as per above. Denies poor PO intake prior to today.  Today Chad Gomez is seen as ER consult / admission for above. Last meal 5AM. No emesis.   Past Medical History  Diagnosis Date  . Hypertension   . Iron deficiency   . History of bladder stone   . Frequency of urination   . Nocturia   . Seasonal allergies   . BPH (benign prostatic hypertrophy)   . Right ureteral stone   . History of melanoma excision     LEFT EAR  . Bladder stone   . History of kidney stones   . History of colon polyps     08/2013   pre-canerous  . Complication of anesthesia     urinary retention  . Frequency of urination   . Weak urinary stream   . Wears glasses   . Full dentures   . Chronic kidney disease     Past Surgical History  Procedure Laterality Date  . Cystolithalopaxy of bladder stone/ transrectal ultrasound prostate bx  08-19-2005  . Cystoscopy with litholapaxy Right 12/02/2012    Procedure: CYSTOSCOPY WITH stone extraction;  Surgeon: Malka So, MD;  Location: [facility];  Service: Urology;  Laterality: Right;  . Ureteroscopy Right 12/02/2012    Procedure: RIGHT URETEROSCOPY STONE EXTRACTION WITH  STENT PLACEMENT;  Surgeon: Malka So, MD;  Location: [facility];  Service: Urology;  Laterality: Right;  . Holmium laser application Right 07/23/8655    Procedure: HOLMIUM LASER APPLICATION;  Surgeon: Malka So, MD;  Location: [facility];  Service: Urology;  Laterality: Right;  . Cystoscopy w/ retrogrades Right  12/02/2012    Procedure: CYSTOSCOPY WITH RETROGRADE PYELOGRAM;  Surgeon: Malka So, MD;  Location: [facility];  Service: Urology;  Laterality: Right;  . Nephrolithotomy Left 01/27/2013    Procedure: NEPHROLITHOTOMY PERCUTANEOUS FIRST LOOK;  Surgeon: Malka So, MD;  Location: [facility];  Service: Urology;  Laterality: Left;  Marland Kitchen Mohs surgery  1996    left ear  . Multiple tooth extractions  1994  . Mandible fracture surgery  1995  . Inguinal hernia repair Right 1954  . Colonoscopy w/ polypectomy  09-13-2013  . Cystoscopy with litholapaxy Right 10/13/2013    Procedure: CYSTOSCOPY WITH LITHOLAPAXY, removal of urethral stone with basket;  Surgeon: Irine Seal, MD;  Location: [facility];  Service: Urology;  Laterality: Right;  . Cystoscopy w/ retrogrades Bilateral 10/13/2013    Procedure: CYSTOSCOPY WITH RETROGRADE PYELOGRAM;  Surgeon: Irine Seal, MD;  Location: [facility];  Service: Urology;  Laterality: Bilateral;  . Lithotripsy  2007 & 02/2013    Family History  Problem Relation Age of Onset  . Colon cancer Neg Hx   . Dementia Mother   . Dementia Father    Social History:  reports that he has been smoking Cigarettes.  He has a 50 pack-year smoking history. He has never used smokeless  tobacco. He reports that he does not drink alcohol or use illicit drugs.  Allergies:  Allergies  Allergen Reactions  . Amoxicillin Other (See Comments)    Severe stomach pain  . Ibuprofen Swelling    Eyes swell  . Naproxen Swelling    Eyes swell  . Tape Other (See Comments)    Surgical tape causes blisters     (Not in a hospital admission)  Results for orders placed during the hospital encounter of 03/21/14 (from the past 48 hour(s))  CBC     Status: Abnormal   Collection Time    03/21/14  1:07 PM      Result Value Ref Range   WBC 17.0 (*) 4.0 - 10.5 K/uL   RBC 5.11  4.22 - 5.81 MIL/uL   Hemoglobin 15.6  13.0 - 17.0 g/dL   HCT 45.7  39.0 - 52.0 %    MCV 89.4  78.0 - 100.0 fL   MCH 30.5  26.0 - 34.0 pg   MCHC 34.1  30.0 - 36.0 g/dL   RDW 12.5  11.5 - 15.5 %   Platelets 217  150 - 400 K/uL  COMPREHENSIVE METABOLIC PANEL     Status: Abnormal   Collection Time    03/21/14  1:07 PM      Result Value Ref Range   Sodium 139  137 - 147 mEq/L   Potassium 4.5  3.7 - 5.3 mEq/L   Chloride 102  96 - 112 mEq/L   CO2 25  19 - 32 mEq/L   Glucose, Bld 157 (*) 70 - 99 mg/dL   BUN 22  6 - 23 mg/dL   Creatinine, Ser 1.44 (*) 0.50 - 1.35 mg/dL   Calcium 10.4  8.4 - 10.5 mg/dL   Total Protein 6.7  6.0 - 8.3 g/dL   Albumin 3.9  3.5 - 5.2 g/dL   AST 17  0 - 37 U/L   Comment: SLIGHT HEMOLYSIS     HEMOLYSIS AT THIS LEVEL MAY AFFECT RESULT   ALT 11  0 - 53 U/L   Alkaline Phosphatase 89  39 - 117 U/L   Total Bilirubin 0.5  0.3 - 1.2 mg/dL   GFR calc non Af Amer 49 (*) >90 mL/min   GFR calc Af Amer 57 (*) >90 mL/min   Comment: (NOTE)     The eGFR has been calculated using the CKD EPI equation.     This calculation has not been validated in all clinical situations.     eGFR's persistently <90 mL/min signify possible Chronic Kidney     Disease.   Anion gap 12  5 - 15  URINALYSIS, ROUTINE W REFLEX MICROSCOPIC     Status: Abnormal   Collection Time    03/21/14  2:42 PM      Result Value Ref Range   Color, Urine YELLOW  YELLOW   APPearance TURBID (*) CLEAR   Specific Gravity, Urine 1.012  1.005 - 1.030   pH 8.0  5.0 - 8.0   Glucose, UA NEGATIVE  NEGATIVE mg/dL   Hgb urine dipstick SMALL (*) NEGATIVE   Bilirubin Urine NEGATIVE  NEGATIVE   Ketones, ur NEGATIVE  NEGATIVE mg/dL   Protein, ur NEGATIVE  NEGATIVE mg/dL   Urobilinogen, UA 0.2  0.0 - 1.0 mg/dL   Nitrite POSITIVE (*) NEGATIVE   Leukocytes, UA LARGE (*) NEGATIVE  URINE MICROSCOPIC-ADD ON     Status: Abnormal   Collection Time    03/21/14  2:42 PM  Result Value Ref Range   Squamous Epithelial / LPF RARE  RARE   WBC, UA 7-10  <3 WBC/hpf   RBC / HPF 3-6  <3 RBC/hpf   Bacteria, UA  MANY (*) RARE   Urine-Other AMORPHOUS URATES/PHOSPHATES     Ct Renal Stone Study  03/21/2014   CLINICAL DATA:  68 year old male with left flank pain. Vomiting. Initial encounter.  EXAM: CT ABDOMEN AND PELVIS WITHOUT CONTRAST  TECHNIQUE: Multidetector CT imaging of the abdomen and pelvis was performed following the standard protocol without IV contrast.  COMPARISON:  CT Abdomen and Pelvis 11/22/2012 and earlier.  FINDINGS: Stable lung bases.  No pericardial or pleural effusion.  Osteopenia. Chronic changes in the spine including L5 pars fractures and mild grade 1 anterolisthesis. No acute osseous abnormality identified.  Extensive chronic Aortoiliac calcified atherosclerosis noted. Stable infrarenal abdominal aortic aneurysm measuring up to 31 mm diameter.  No pelvic free fluid. Stable fact containing left inguinal hernia. Negative distal colon.  Redundant but otherwise negative more proximal colon. Normal appendix. No dilated small bowel. Decompressed stomach. Stable small hiatal hernia.  Chronic cholelithiasis. No pericholecystic inflammation. Stable liver with occasional sub cm low-density areas which appear benign. Negative non contrast spleen, pancreas, and adrenal glands. No abdominal free fluid.  Chronic right renal atrophy with large lower pole and smaller midpole calculi. Stable right ureter. No right perinephric stranding.  Inflamed and obstructed left kidney with moderate perinephric stranding and/or trace left para renal space fluid. Obstructing stone lodged at the left ureteropelvic junction measuring 17 x 9 x 9 mm. Small or nonobstructing left lower pole renal calculi. Superimposed left renal hilar vascular calcifications. B on the obstructing stone, the left ureter is stable.  Multiple bulky stones within the bladder (series 2, image 75). Chronic impression upon the base of the bladder from the prostate, not significantly changed.  IMPRESSION: 1. Acute obstructive uropathy on the left with a bulky  oval stone lodged at the left UPJ (9 x 9 x 17 mm). Possible forniceal rupture. 2. Bilateral nonobstructing nephrolithiasis. Multiple bulky stones within the bladder. Chronic right renal atrophy. 3. Stable infrarenal abdominal aortic aneurysm, 31 mm diameter. Recommend follow up by [place] in 3 years. This recommendation follows ACR consensus guidelines: White Paper of the ACR Incidental Findings Committee II on Vascular Findings. Joellyn Rued [id number]; 00:923-300. 4. Chronic cholelithiasis.   Electronically Signed   By: Lars Pinks M.D.   On: 03/21/2014 13:35    Review of Systems  Constitutional: Positive for malaise/fatigue. Negative for fever and chills.  HENT: Negative.   Eyes: Negative.   Respiratory: Negative.   Cardiovascular: Negative.   Gastrointestinal: Negative.   Genitourinary: Negative.   Musculoskeletal: Negative.   Skin: Negative.   Neurological: Negative.   Endo/Heme/Allergies: Negative.   Psychiatric/Behavioral: Negative.     Blood pressure 120/77, pulse 66, temperature 97.5 F (36.4 C), temperature source Oral, resp. rate 18, SpO2 99.00%. Physical Exam  Constitutional: He is oriented to person, place, and time. He appears well-developed.  Wife at bedside  HENT:  Head: Normocephalic.  Eyes: Pupils are equal, round, and reactive to light.  Neck: Normal range of motion. Neck supple.  Cardiovascular: Normal rate.   Respiratory: Effort normal.  GI: Soft. Bowel sounds are normal.  Genitourinary: Penis normal.  Mild Left CVAT  Musculoskeletal: Normal range of motion.  Neurological: He is alert and oriented to person, place, and time.  Skin: Skin is warm and dry.  Psychiatric: He has a normal mood and  affect. His behavior is normal. Judgment and thought content normal.     Assessment/Plan  1 - Left Ureteral Stone  - large left obstructing stone complicated by possible early pyelonephritis / bacteruria and acute renal failure. First priority renal drainage. Discussed options  of stent v. neph tube and they opt for JJ stent. This is reasonable. Risks including bleeding, infection, damage to kidney / ureter / bladder, need for staged surgery discussed. Plan for admission post-op for IV ABX and await final CX and clearance of acute infectious parameters before DC.   Posted for today.   2 - Bacteruria, Leukocytosis - UCX pending, will place on empiric IV Cipro + PO bactrim.   3 - Acute Renal Failure - likely pre/post renal. Stent as per above and Iv hydration, recheck AM labs.     Criag Wicklund 03/21/2014, 5:04 PM

## 2014-03-21 NOTE — ED Notes (Signed)
Pt states left flank pain x 2 - 3 hrs ago.  Hx of stones.  No fever.  Vomiting x 1

## 2014-03-21 NOTE — Transfer of Care (Signed)
Immediate Anesthesia Transfer of Care Note  Patient: Chad Gomez  Procedure(s) Performed: Procedure(s): CYSTOSCOPY WITH RETROGRADE PYELOGRAM, AND LEFT STENT PLACEMENT (Left)  Patient Location: PACU  Anesthesia Type:General  Level of Consciousness: awake, alert  and patient cooperative  Airway & Oxygen Therapy: Patient Spontanous Breathing and Patient connected to face mask oxygen  Post-op Assessment: Report given to PACU RN, Post -op Vital signs reviewed and stable and Patient moving all extremities X 4  Post vital signs: Reviewed and stable  Complications: No apparent anesthesia complications

## 2014-03-21 NOTE — Brief Op Note (Signed)
03/21/2014  9:21 PM  PATIENT:  Chad Gomez  68 y.o. male  PRE-OPERATIVE DIAGNOSIS:  LEFT URETERAL STONE AND INFECTION  POST-OPERATIVE DIAGNOSIS:  left ureteral stone  PROCEDURE:  Procedure(s): CYSTOSCOPY WITH RETROGRADE PYELOGRAM, AND LEFT STENT PLACEMENT (Left)  SURGEON:  Surgeon(s) and Role:    * Alexis Frock, MD - Primary  PHYSICIAN ASSISTANT:   ASSISTANTS: none   ANESTHESIA:   general  EBL:  Total I/O In: 0  Out: 150 [Urine:150]  BLOOD ADMINISTERED:none  DRAINS: none   LOCAL MEDICATIONS USED:  NONE  SPECIMEN:  No Specimen  DISPOSITION OF SPECIMEN:  N/A  COUNTS:  YES  TOURNIQUET:  * No tourniquets in log *  DICTATION: .Other Dictation: Dictation Number [id number]  PLAN OF CARE: Admit to inpatient   PATIENT DISPOSITION:  PACU - hemodynamically stable.   Delay start of Pharmacological VTE agent (>24hrs) due to surgical blood loss or risk of bleeding: not applicable

## 2014-03-21 NOTE — Anesthesia Postprocedure Evaluation (Signed)
Anesthesia Post Note  Patient: Chad Gomez  Procedure(s) Performed: Procedure(s) (LRB): CYSTOSCOPY WITH RETROGRADE PYELOGRAM, AND LEFT STENT PLACEMENT (Left)  Anesthesia type: General  Patient location: PACU  Post pain: Pain level controlled  Post assessment: Post-op Vital signs reviewed  Last Vitals: BP 139/86  Pulse 96  Temp(Src) 37.1 C (Oral)  Resp 16  Ht 5\' 10"  (1.778 m)  Wt 181 lb 14.1 oz (82.5 kg)  BMI 26.10 kg/m2  SpO2 94%  Post vital signs: Reviewed  Level of consciousness: sedated  Complications: No apparent anesthesia complications

## 2014-03-21 NOTE — ED Notes (Signed)
Patient is unable to urinate, he is in too much pain

## 2014-03-22 ENCOUNTER — Encounter (HOSPITAL_COMMUNITY): Payer: Self-pay | Admitting: Urology

## 2014-03-22 DIAGNOSIS — N289 Disorder of kidney and ureter, unspecified: Secondary | ICD-10-CM

## 2014-03-22 LAB — BASIC METABOLIC PANEL
ANION GAP: 10 (ref 5–15)
BUN: 23 mg/dL (ref 6–23)
CHLORIDE: 103 meq/L (ref 96–112)
CO2: 26 meq/L (ref 19–32)
CREATININE: 1.38 mg/dL — AB (ref 0.50–1.35)
Calcium: 10.2 mg/dL (ref 8.4–10.5)
GFR calc Af Amer: 60 mL/min — ABNORMAL LOW (ref >90)
GFR calc non Af Amer: 51 mL/min — ABNORMAL LOW (ref >90)
Glucose, Bld: 144 mg/dL — ABNORMAL HIGH (ref 70–99)
Potassium: 4.5 mEq/L (ref 3.7–5.3)
Sodium: 139 mEq/L (ref 137–147)

## 2014-03-22 LAB — CBC
HEMATOCRIT: 45.2 % (ref 39.0–52.0)
Hemoglobin: 14.8 g/dL (ref 13.0–17.0)
MCH: 29.7 pg (ref 26.0–34.0)
MCHC: 32.7 g/dL (ref 30.0–36.0)
MCV: 90.8 fL (ref 78.0–100.0)
Platelets: 197 10*3/uL (ref 150–400)
RBC: 4.98 MIL/uL (ref 4.22–5.81)
RDW: 12.7 % (ref 11.5–15.5)
WBC: 15.2 10*3/uL — AB (ref 4.0–10.5)

## 2014-03-22 MED ORDER — SULFAMETHOXAZOLE-TMP DS 800-160 MG PO TABS: 1.0000 | ORAL_TABLET | Freq: Two times a day (BID) | ORAL | Status: DC

## 2014-03-22 MED ORDER — PHENAZOPYRIDINE HCL 200 MG PO TABS: 200.0000 mg | ORAL_TABLET | Freq: Three times a day (TID) | ORAL | Status: DC | PRN

## 2014-03-22 MED ORDER — OXYCODONE-ACETAMINOPHEN 5-325 MG PO TABS: 1.0000 | ORAL_TABLET | ORAL | Status: DC | PRN

## 2014-03-22 NOTE — ED Provider Notes (Signed)
Dr. Tresa Moore with Urology has seen and will admit pt to his service for a stent today.  [place], DO 03/22/14 [phone]

## 2014-03-22 NOTE — Op Note (Signed)
NAMEMarland Kitchen  Chad Gomez, Chad Gomez              ACCOUNT NO.:  [id number]  MEDICAL RECORD NO.:  [id number]  LOCATION:  1430                         FACILITY:  [facility]  PHYSICIAN:  Alexis Frock, MD     DATE OF BIRTH:  [birth year]  DATE OF PROCEDURE: 03/21/2014  DATE OF DISCHARGE:                              OPERATIVE REPORT   DIAGNOSES:  Left ureteral stone, acute renal failure, pyuria.  PROCEDURES: 1. Cystoscopy with left retrograde pyelogram and interpretation. 2. Left ureteral stent placement, 6 x 26, no tether.  FINDINGS: 1. Significant bilobar prostatic hypertrophy. 2. Multifocal bladder stones. 3. Impacted left proximal ureteral stone with moderate hydronephrosis     above this. 4. Successful placement of left ureteral stent, proximal curl upper     pole, distal curl urinary bladder.  ESTIMATED BLOOD LOSS:  Nil.  COMPLICATIONS:  None.  SPECIMEN:  None.  INDICATIONS:  Chad Gomez is a pleasant 68 year old gentleman with long history of recurrent nephrolithiasis.  He normally follows with my colleague, Dr. Jeffie Pollock as an outpatient.  He presented to the ER today with acute left flank pain consistent with likely prior stone.  He underwent CT imaging, which corroborated a large impacted left proximal 17-mm ureteral stone with proximal hydronephrosis.  Notably, he has some right renal atrophy and he had rise in his creatinine to 1.4 from a baseline of less than 0.09, also some equivocal infectious parameters including significant pyuria and leukocytosis 17,000 as well as bacteriuria.  Options were discussed including left ureteral stenting for goal of renal drainage, infection clearance with definitive management of the stone in a staged fashion, and he wished to proceed. Informed consent was obtained and placed in the medical record.  PROCEDURE IN DETAIL:  The patient being Chad Gomez, was verified. Procedure being left ureteral stent placement was confirmed.  Procedure was carried  out.  Time-out was performed.  Intravenous antibiotics were administered.  General LMA anesthesia was introduced.  The patient was into a low lithotomy position, and a sterile field was created by prepping and draping the patient's penis, perineum, and proximal thighs using iodine x3.  Next, cystourethroscopy was performed using a 22- French rigid cystoscope with 12-degree offset lens.  Inspection of the anterior and posterior urethra revealed significant bilobar prostatic hypertrophy.  Inspection of the urinary bladder revealed some cloudy- appearing urine with large volume multifocal bladder stones.  The left orifice was visualized, cannulated with 6-French end-hole catheter and left retrograde pyelogram was obtained.  Left retrograde pyelogram demonstrated a single left ureter with single- system left kidney.  There was significant impaction with an ovoid filling defect in the proximal ureter consistent with known stone.  A 0.038 Zip wire was advanced to this level and multiple angulations were used to try to get pass the stone, however, just curled initially.  The open-ended catheter was advanced to this location and using additional retrograde contrast under pressure, the stone was displaced superiorly thus unblocking the area of the UPJ, was then easily allowed the Zip wire to traverse the level of the upper pole and was verified by retrograde pyelogram and fluoroscopy.  There was moderate hydronephrosis again above the stone.  Next, the open-ended catheter  was exchanged for a 6 x 26 Contour stent.  Good proximal curl was achieved in the upper pole and good distal curl in the level of the urinary bladder.  There was efflux of proteinaceous-appearing urine around and through the distal end of the stent.  Bladder was emptied per cystoscope.  Procedure was then terminated.  The patient tolerated the procedure well.  There were no immediate periprocedural complications.  The patient was  taken to the postanesthesia care unit in stable condition.  He will return for inpatient admission as per prior plan.          ______________________________ Alexis Frock, MD     TM/MEDQ  D:  03/21/2014  T:  03/22/2014  Job:  [id number]

## 2014-03-22 NOTE — Progress Notes (Signed)
Pt returned from surgery. VSS, denies any needs. Will continue to monitor.

## 2014-03-22 NOTE — Progress Notes (Signed)
1 Day Post-Op  Subjective:  1 - Left Ureteral Stone - s/p left ureteral stent 03/21/14 for left 17 mm UPJ stone with hydro and likely early forniceal rupture by CT stone on eval acute left flank pain.   2 - Bacteruria, Leukocytosis - UA in ER nit+ with many bacteria on gram stain, leukocytosis to 17k. UCX pending.  3 - Acute Renal Failure - CR 1.4 on ER labs up from baseline <0.9. Left stone with hydro as per above. His right kidney is atrophic.   Today Chad Gomez is without complaints. No fevers overnight. Cr trending down. Some urgency from stent as expected.   Objective: Vital signs in last 24 hours: Temp:  [97.5 F (36.4 C)-98.8 F (37.1 C)] 98 F (36.7 C) (09/30 0500) Pulse Rate:  [65-97] 93 (09/30 0500) Resp:  [14-20] 18 (09/30 0500) BP: (120-171)/(69-86) 160/80 mmHg (09/30 0500) SpO2:  [88 %-100 %] 95 % (09/30 0200) Weight:  [82.5 kg (181 lb 14.1 oz)] 82.5 kg (181 lb 14.1 oz) (09/29 1954) Last BM Date: 03/20/14  Intake/Output from previous day: 09/29 0701 - 09/30 0700 In: 400 [I.V.:400] Out: 1200 [Urine:1200] Intake/Output this shift: Total I/O In: 400 [I.V.:400] Out: 1200 [Urine:1200]  General appearance: alert, cooperative and appears stated age Head: Normocephalic, without obvious abnormality, atraumatic Nose: Nares normal. Septum midline. Mucosa normal. No drainage or sinus tenderness., [place] O2 Throat: lips, mucosa, and tongue normal; teeth and gums normal Neck: supple, symmetrical, trachea midline Back: symmetric, no curvature. ROM normal. No CVA tenderness. Resp: non-labored Cardio: Nl rate GI: soft, non-tender; bowel sounds normal; no masses,  no organomegaly Male genitalia: normal, condom cath in place for convenience.  Extremities: extremities normal, atraumatic, no cyanosis or edema Pulses: 2+ and symmetric Skin: Skin color, texture, turgor normal. No rashes or lesions Lymph nodes: Cervical, supraclavicular, and axillary nodes normal. Neurologic: Grossly  normal  Lab Results:   Recent Labs  03/21/14 1307 03/22/14 0433  WBC 17.0* 15.2*  HGB 15.6 14.8  HCT 45.7 45.2  PLT 217 197   BMET  Recent Labs  03/21/14 1307 03/22/14 0433  NA 139 139  K 4.5 4.5  CL 102 103  CO2 25 26  GLUCOSE 157* 144*  BUN 22 23  CREATININE 1.44* 1.38*  CALCIUM 10.4 10.2   PT/INR No results found for this basename: LABPROT, INR,  in the last 72 hours ABG No results found for this basename: PHART, PCO2, PO2, HCO3,  in the last 72 hours  Studies/Results: Ct Renal Stone Study  03/21/2014   CLINICAL DATA:  68 year old male with left flank pain. Vomiting. Initial encounter.  EXAM: CT ABDOMEN AND PELVIS WITHOUT CONTRAST  TECHNIQUE: Multidetector CT imaging of the abdomen and pelvis was performed following the standard protocol without IV contrast.  COMPARISON:  CT Abdomen and Pelvis 11/22/2012 and earlier.  FINDINGS: Stable lung bases.  No pericardial or pleural effusion.  Osteopenia. Chronic changes in the spine including L5 pars fractures and mild grade 1 anterolisthesis. No acute osseous abnormality identified.  Extensive chronic Aortoiliac calcified atherosclerosis noted. Stable infrarenal abdominal aortic aneurysm measuring up to 31 mm diameter.  No pelvic free fluid. Stable fact containing left inguinal hernia. Negative distal colon.  Redundant but otherwise negative more proximal colon. Normal appendix. No dilated small bowel. Decompressed stomach. Stable small hiatal hernia.  Chronic cholelithiasis. No pericholecystic inflammation. Stable liver with occasional sub cm low-density areas which appear benign. Negative non contrast spleen, pancreas, and adrenal glands. No abdominal free fluid.  Chronic  right renal atrophy with large lower pole and smaller midpole calculi. Stable right ureter. No right perinephric stranding.  Inflamed and obstructed left kidney with moderate perinephric stranding and/or trace left para renal space fluid. Obstructing stone lodged at  the left ureteropelvic junction measuring 17 x 9 x 9 mm. Small or nonobstructing left lower pole renal calculi. Superimposed left renal hilar vascular calcifications. B on the obstructing stone, the left ureter is stable.  Multiple bulky stones within the bladder (series 2, image 75). Chronic impression upon the base of the bladder from the prostate, not significantly changed.  IMPRESSION: 1. Acute obstructive uropathy on the left with a bulky oval stone lodged at the left UPJ (9 x 9 x 17 mm). Possible forniceal rupture. 2. Bilateral nonobstructing nephrolithiasis. Multiple bulky stones within the bladder. Chronic right renal atrophy. 3. Stable infrarenal abdominal aortic aneurysm, 31 mm diameter. Recommend follow up by [place] in 3 years. This recommendation follows ACR consensus guidelines: White Paper of the ACR Incidental Findings Committee II on Vascular Findings. Joellyn Rued [id number]; 95:188-416. 4. Chronic cholelithiasis.   Electronically Signed   By: Lars Pinks M.D.   On: 03/21/2014 13:35    Anti-infectives: Anti-infectives   Start     Dose/Rate Route Frequency Ordered Stop   03/21/14 2200  sulfamethoxazole-trimethoprim (BACTRIM DS) 800-160 MG per tablet 1 tablet     1 tablet Oral Every 12 hours 03/21/14 1714     03/21/14 1715  ciprofloxacin (CIPRO) IVPB 400 mg     400 mg 200 mL/hr over 60 Minutes Intravenous Every 12 hours 03/21/14 1714     03/21/14 1615  cefTRIAXone (ROCEPHIN) 1 g in dextrose 5 % 50 mL IVPB     1 g 100 mL/hr over 30 Minutes Intravenous  Once 03/21/14 1601 03/21/14 1642      Assessment/Plan:   1 - Left Ureteral Stone - s/p left ureteral stent at temporizing measure. Will need definitive stone therapy in next few weeks.   2 - Bacteruria, Leukocytosis - afebrile, leukocytosis trending down. Continue current ABX.  3 - Acute Renal Failure - Cr improving with hydration and after stent.     [facility], Bailyn Spackman 03/22/2014

## 2014-03-22 NOTE — Discharge Instructions (Signed)
Ureteral Stent Implantation, Care After °Refer to this sheet in the next few weeks. These instructions provide you with information on caring for yourself after your procedure. Your health care provider may also give you more specific instructions. Your treatment has been planned according to current medical practices, but problems sometimes occur. Call your health care provider if you have any problems or questions after your procedure. °WHAT TO EXPECT AFTER THE PROCEDURE °You should be back to normal activity within 48 hours after the procedure. Nausea and vomiting may occur and are commonly the result of anesthesia. °It is common to experience sharp pain in the back or lower abdomen and penis with voiding. This is caused by movement of the ends of the stent with the act of urinating. It usually goes away within minutes after you have stopped urinating. °HOME CARE INSTRUCTIONS °Make sure to drink plenty of fluids. You may have small amounts of bleeding, causing your urine to be red. This is normal. Certain movements may trigger pain or a feeling that you need to urinate. You may be given medicines to prevent infection or bladder spasms. Be sure to take all medicines as directed. Only take over-the-counter or prescription medicines for pain, discomfort, or fever as directed by your health care provider. Do not take aspirin, as this can make bleeding worse. °Your stent will be left in until the blockage is resolved. This may take 2 weeks or longer, depending on the reason for stent implantation. You may have an X-ray exam to make sure your ureter is open and that the stent has not moved out of position (migrated). The stent can be removed by your health care provider in the office. Medicines may be given for comfort while the stent is being removed. Be sure to keep all follow-up appointments so your health care provider can check that you are healing properly. °SEEK MEDICAL CARE IF: °· You experience increasing  pain. °· Your pain medicine is not working. °SEEK IMMEDIATE MEDICAL CARE IF: °· Your urine is dark red or has blood clots. °· You are leaking urine (incontinent). °· You have a fever, chills, feeling sick to your stomach (nausea), or vomiting. °· Your pain is not relieved by pain medicine. °· The end of the stent comes out of the urethra. °· You are unable to urinate. °Document Released: 02/09/2013 Document Revised: 06/14/2013 Document Reviewed: 02/09/2013 °[employer]® Patient Information ©2015 [employer]. This information is not intended to replace advice given to you by your health care provider. Make sure you discuss any questions you have with your health care provider. ° °

## 2014-03-22 NOTE — Discharge Summary (Signed)
Physician Discharge Summary  Patient ID: Chad Gomez MRN: [id number] DOB/AGE: 1945/12/21 68 y.o.  Admit date: 03/21/2014 Discharge date: 03/22/2014  Admission Diagnoses:  Ureteral stone with hydronephrosis  Discharge Diagnoses:  Principal Problem:   Ureteral stone with hydronephrosis Active Problems:   Bladder stone   Ureteral stone   Acute renal insufficiency   Past Medical History  Diagnosis Date  . Hypertension   . Iron deficiency   . History of bladder stone   . Frequency of urination   . Nocturia   . Seasonal allergies   . BPH (benign prostatic hypertrophy)   . Right ureteral stone   . History of melanoma excision     LEFT EAR  . Bladder stone   . History of kidney stones   . History of colon polyps     08/2013   pre-canerous  . Complication of anesthesia     urinary retention  . Frequency of urination   . Weak urinary stream   . Wears glasses   . Full dentures   . Chronic kidney disease     Surgeries: Procedure(s): CYSTOSCOPY WITH RETROGRADE PYELOGRAM, AND LEFT STENT PLACEMENT on 03/21/2014   Consultants (if any): Treatment Team:  Alexis Frock, MD  Discharged Condition: Improved  Hospital Course: Chad Gomez is an 68 y.o. male who was admitted 03/21/2014 with a diagnosis of Ureteral stone with hydronephrosis and went to the operating room on 03/21/2014 and underwent the above named procedures.  He also has a large bladder stone.   He is better this AM and remains afebrile.   He has some pain in the flank with voiding.   He is ready for discharge.   He was given perioperative antibiotics:      Anti-infectives   Start     Dose/Rate Route Frequency Ordered Stop   03/22/14 0000  sulfamethoxazole-trimethoprim (BACTRIM DS) 800-160 MG per tablet     1 tablet Oral 2 times daily 03/22/14 0816     03/21/14 2200  sulfamethoxazole-trimethoprim (BACTRIM DS) 800-160 MG per tablet 1 tablet     1 tablet Oral Every 12 hours 03/21/14 1714     03/21/14 1715   ciprofloxacin (CIPRO) IVPB 400 mg     400 mg 200 mL/hr over 60 Minutes Intravenous Every 12 hours 03/21/14 1714     03/21/14 1615  cefTRIAXone (ROCEPHIN) 1 g in dextrose 5 % 50 mL IVPB     1 g 100 mL/hr over 30 Minutes Intravenous  Once 03/21/14 1601 03/21/14 1642    .  He was given sequential compression devices for DVT prophylaxis.  He benefited maximally from the hospital stay and there were no complications.    Recent vital signs:  Filed Vitals:   03/22/14 0500  BP: 160/80  Pulse: 93  Temp: 98 F (36.7 C)  Resp: 18    Recent laboratory studies:  Lab Results  Component Value Date   HGB 14.8 03/22/2014   HGB 15.6 03/21/2014   HGB 17.7* 10/13/2013   Lab Results  Component Value Date   WBC 15.2* 03/22/2014   PLT 197 03/22/2014   Lab Results  Component Value Date   INR 1.08 01/26/2013   Lab Results  Component Value Date   NA 139 03/22/2014   K 4.5 03/22/2014   CL 103 03/22/2014   CO2 26 03/22/2014   BUN 23 03/22/2014   CREATININE 1.38* 03/22/2014   GLUCOSE 144* 03/22/2014    Discharge Medications:     Medication List  amLODipine 10 MG tablet  Commonly known as:  NORVASC  Take 10 mg by mouth every morning.     aspirin 325 MG tablet  Take 325 mg by mouth daily.     fluticasone 50 MCG/ACT nasal spray  Commonly known as:  FLONASE  Place 2 sprays into the nose 2 (two) times daily.     Iron 325 (65 FE) MG Tabs  Take 1 tablet by mouth every morning.     lisinopril 20 MG tablet  Commonly known as:  PRINIVIL,ZESTRIL  Take 20 mg by mouth daily.     loratadine 10 MG tablet  Commonly known as:  CLARITIN  Take 10 mg by mouth daily.     niacin 500 MG tablet  Take 500 mg by mouth daily with breakfast.     oxyCODONE-acetaminophen 5-325 MG per tablet  Commonly known as:  ROXICET  Take 1 tablet by mouth every 4 (four) hours as needed for severe pain.     phenazopyridine 200 MG tablet  Commonly known as:  PYRIDIUM  Take 1 tablet (200 mg total) by mouth 3  (three) times daily as needed for pain.     POTASSIUM CITRATE PO  Take 180 mg by mouth 2 (two) times daily.     sulfamethoxazole-trimethoprim 800-160 MG per tablet  Commonly known as:  BACTRIM DS  Take 1 tablet by mouth 2 (two) times daily.     VITAMIN B-12 PO  Take 1 capsule by mouth daily.     VITAMIN D PO  Take 1 capsule by mouth daily.        Diagnostic Studies: Ct Renal Stone Study  03/21/2014   CLINICAL DATA:  68 year old male with left flank pain. Vomiting. Initial encounter.  EXAM: CT ABDOMEN AND PELVIS WITHOUT CONTRAST  TECHNIQUE: Multidetector CT imaging of the abdomen and pelvis was performed following the standard protocol without IV contrast.  COMPARISON:  CT Abdomen and Pelvis 11/22/2012 and earlier.  FINDINGS: Stable lung bases.  No pericardial or pleural effusion.  Osteopenia. Chronic changes in the spine including L5 pars fractures and mild grade 1 anterolisthesis. No acute osseous abnormality identified.  Extensive chronic Aortoiliac calcified atherosclerosis noted. Stable infrarenal abdominal aortic aneurysm measuring up to 31 mm diameter.  No pelvic free fluid. Stable fact containing left inguinal hernia. Negative distal colon.  Redundant but otherwise negative more proximal colon. Normal appendix. No dilated small bowel. Decompressed stomach. Stable small hiatal hernia.  Chronic cholelithiasis. No pericholecystic inflammation. Stable liver with occasional sub cm low-density areas which appear benign. Negative non contrast spleen, pancreas, and adrenal glands. No abdominal free fluid.  Chronic right renal atrophy with large lower pole and smaller midpole calculi. Stable right ureter. No right perinephric stranding.  Inflamed and obstructed left kidney with moderate perinephric stranding and/or trace left para renal space fluid. Obstructing stone lodged at the left ureteropelvic junction measuring 17 x 9 x 9 mm. Small or nonobstructing left lower pole renal calculi. Superimposed  left renal hilar vascular calcifications. B on the obstructing stone, the left ureter is stable.  Multiple bulky stones within the bladder (series 2, image 75). Chronic impression upon the base of the bladder from the prostate, not significantly changed.  IMPRESSION: 1. Acute obstructive uropathy on the left with a bulky oval stone lodged at the left UPJ (9 x 9 x 17 mm). Possible forniceal rupture. 2. Bilateral nonobstructing nephrolithiasis. Multiple bulky stones within the bladder. Chronic right renal atrophy. 3. Stable infrarenal abdominal aortic aneurysm, 31 mm  diameter. Recommend follow up by [place] in 3 years. This recommendation follows ACR consensus guidelines: White Paper of the ACR Incidental Findings Committee II on Vascular Findings. Joellyn Rued [id number]; 89:169-450. 4. Chronic cholelithiasis.   Electronically Signed   By: Lars Pinks M.D.   On: 03/21/2014 13:35    Disposition: 01-Home or Self Care  Discharge Instructions   Discontinue IV    Complete by:  As directed            Follow-up Information   Follow up with Malka So, MD. (I will have office call to arrange f/u)    Specialty:  Urology   Contact information:   [address] [id number] [phone]        Signed: Malka So 03/22/2014, 8:18 AM

## 2014-03-24 ENCOUNTER — Other Ambulatory Visit: Payer: Self-pay | Admitting: Urology

## 2014-03-27 ENCOUNTER — Other Ambulatory Visit: Payer: Self-pay | Admitting: Urology

## 2014-03-27 DIAGNOSIS — N200 Calculus of kidney: Secondary | ICD-10-CM

## 2014-04-05 ENCOUNTER — Encounter (HOSPITAL_BASED_OUTPATIENT_CLINIC_OR_DEPARTMENT_OTHER): Payer: Self-pay | Admitting: *Deleted

## 2014-04-05 NOTE — Progress Notes (Signed)
To [facility] at 0600 Istat on arrival,Ekg in [place] after mn-will take amlodipine,claritin with sip water that am-pain meds as need.

## 2014-04-10 NOTE — H&P (Signed)
Active Problems Problems  1. Bilateral kidney stones (N20.0) 2. Bladder calculus (N21.0) 3. Calculus of ureter (N20.1) 4. Elevated prostate specific antigen (PSA) (R97.2) 5. Erectile dysfunction due to arterial insufficiency (N52.01) 6. Hypercalcemia (E83.52) 7. Hypocitraturia (R82.99) 8. Nodular prostate without lower urinary tract symptoms (N40.2) 9. Pyuria (N39.0) 10. Urinary retention (R33.9) 11. Vitamin D deficiency (E55.9)  History of Present Illness Chad Gomez returns today in f/u from his recent right ureteral stenting.  He is doing well but is taking the oxycodone q6hrs.  He has multiple bladder stones and left renal stones and had a 47mm LUP stone with obstruction that required the stent. He has hypercalcuria with hyperoxaluria on his prior 24hr urine. He was on urocit K tid but that was cut back to qday by his PCP because of an elevated potassium. He has no associated signs or symptoms.     Past Medical History Problems  1. History of Alopecia Areata 2. History of Arthritis 3. History of hypertension (Z86.79) 4. History of kidney stones (Z87.442) 5. History of Microscopic hematuria (R31.2)  Surgical History Problems  1. History of Biopsy Of The Prostate Needle 2. History of Cystoscopy With Complicated Removal Of Object 3. History of Cystoscopy With Fragmentation Of Bladder Calculus 4. History of Cystoscopy With Insertion Of Ureteral Stent Left 5. History of Cystoscopy With Insertion Of Ureteral Stent Right 6. History of Cystoscopy With Ureteroscopy With Lithotripsy 7. History of Cystoscopy With Ureteroscopy With Removal Of Calculus 8. History of Inguinal Hernia Repair 9. History of Lithotripsy 10. History of Percutaneous Lithotomy For Stone Over 2cm.  Current Meds 1. AmLODIPine Besylate 10 MG Oral Tablet;  Therapy: [id number] to Recorded 2. Ciprofloxacin HCl - 500 MG Oral Tablet; CIPRO 500 MG BID #14;  Therapy: [id number] to (Last Rx:12May2015)  Requested for:  [id number] Ordered 3. Claritin 10 MG Oral Capsule;  Therapy: (Recorded:27Mar2015) to Recorded 4. Flonase 50 MCG/ACT SUSP;  Therapy: (Recorded:28May2014) to Recorded 5. Iron TABS;  Therapy: (Recorded:28May2014) to Recorded 6. Lisinopril 20 MG Oral Tablet;  Therapy: (Recorded:27Mar2015) to Recorded 7. Niacin 500 MG Oral Tablet;  Therapy: (Recorded:28May2014) to Recorded 8. Potassium Citrate ER 10 MEQ (1080 MG) Oral Tablet Extended Release; TAKE 1 TABLET  TID;  Therapy: 32GMW1027 to (Last Rx:17Nov2014)  Requested for: [id number] Ordered  Allergies Medication  1. Amoxicillin TABS 2. Naproxen TABS 3. Paper Tape 1"x12yd TAPE Non-Medication  4. Tape  Family History Problems  1. Family history of Death In The Family Father   age 33 of alzheimers 2. Family history of Death In The Family Mother   age 51 of alzheimers 3. Family history of Family Health Status Number Of Children   2 daughters 55. Family history of Hypertension : Sister  Social History Problems  1. Denied: History of Alcohol Use 2. Denied: History of Caffeine Use 3. Current every day smoker (F17.200) 4. Former cigarette smoker (Z87.891) 5. Marital History - Currently Married 6. Occupation:   Retail 7. Tobacco use (Z72.0)   smokes 40 cigarettes a day  x 52 years  Past and social history reviewed and updated.   Review of Systems  Gastrointestinal: flank pain and constipation.  Constitutional: no fever.  Cardiovascular: no chest pain.    Vitals Vital Signs [Data Includes: Last 1 Day]  Recorded: 05Oct2015 11:01AM  Blood Pressure: 135 / 82 Temperature: 98.1 F Heart Rate: 97  Physical Exam Constitutional: Well nourished and well developed . No acute distress.  Pulmonary: No respiratory distress and normal respiratory rhythm and effort.  Cardiovascular:  Heart rate and rhythm are normal . No peripheral edema.    Results/Data Urine [Data Includes: Last 1 Day]   05Oct2015  COLOR ORANGE   APPEARANCE  CLEAR   SPECIFIC GRAVITY 1.010   pH 7.0   GLUCOSE 100 mg/dL  BILIRUBIN NEG   KETONE NEG mg/dL  BLOOD SMALL   PROTEIN 30 mg/dL  UROBILINOGEN 2 mg/dL  NITRITE POS   LEUKOCYTE ESTERASE SMALL   SQUAMOUS EPITHELIAL/HPF RARE   WBC 7-10 WBC/hpf  RBC 11-20 RBC/hpf  BACTERIA FEW   CRYSTALS NONE SEEN   CASTS NONE SEEN    The following images/tracing/specimen were independently visualized:  KUB today shows the stent in good position. He has multple bladder stones up to 72mm in size and the left UPJ stone has fallen into the LLP and is now adjacent to the remaining left renal stones. There are no other significant abnormalities.  The following clinical lab reports were reviewed:  UA reviewed. Prior labs and stone risk profile reviewed.    Assessment Assessed  1. Bilateral kidney stones (N20.0) 2. Bladder calculus (N21.0)  He has recurrent urolithiasis with multiple small to moderate sized bladder stones and multiple left renal stones with the largest 17 x 79mm in size. He is doing well with the stent but requires continued pain med.   He has a history of hypercalcuria.   Plan Bilateral kidney stones  1. Renew: Potassium Citrate ER 10 MEQ (1080 MG) Oral Tablet Extended Release; TAKE 1  TABLET TID 2. HYPERCALCIURA PROFILE; Status:Hold For - Specimen/Data Collection,Appointment;  Requested for:05Oct2015;  3. KUB; Status:Canceled;  4. KUB; Status:Resulted - Requires Verification;   Done: [id number] 12:00AM 5. Get Outside Records Office  Follow-up  Status: Hold For - Records,Records Release   Requested for: 05Oct2015 Bladder calculus  6. Start: Oxycodone-Acetaminophen 5-325 MG Oral Tablet; take 1 or 2 tablets q 4-6 hours  prn pain 7. Follow-up Schedule Surgery Office  Follow-up  Status: Hold For - Appointment   Requested for: 05Oct2015 Health Maintenance  8. UA With REFLEX; [Do Not Release]; Status:Resulted - Requires Verification;   Done:  23NTI1443 10:43AM Pyuria  9. URINE CULTURE;  Status:In Progress - Specimen/Data Collected;   Done: [phone]  I have refilled his pain med.  Urine culture today.  I was going to do cystolithalopaxy and a left PCNL but on further review, I will do cystolithalopaxy and left ureterorenoscopy with holmium to avoid the perc since I have to do cystoscopy anyway.  I have reviewed the risks of bleeding, infection, need for secondary procedures, ureteral and bladder injuries and anesthetic complications.   I will get recent labs from Dr. Thea Silversmith.  I have refilled the Urocit K and will repeat a hypercalcuria profile.   He will probably need to be placed on a thiazide as well.    Discussion/Summary CC: Dr. Deeann Saint.

## 2014-04-11 ENCOUNTER — Ambulatory Visit (HOSPITAL_BASED_OUTPATIENT_CLINIC_OR_DEPARTMENT_OTHER): Payer: BC Managed Care – PPO | Admitting: Anesthesiology

## 2014-04-11 ENCOUNTER — Encounter (HOSPITAL_BASED_OUTPATIENT_CLINIC_OR_DEPARTMENT_OTHER): Payer: BC Managed Care – PPO | Admitting: Anesthesiology

## 2014-04-11 ENCOUNTER — Ambulatory Visit (HOSPITAL_BASED_OUTPATIENT_CLINIC_OR_DEPARTMENT_OTHER)
Admission: RE | Admit: 2014-04-11 | Discharge: 2014-04-11 | Disposition: A | Discharge status: Home / Self Care | Payer: BC Managed Care – PPO | Source: Ambulatory Visit | Attending: Urology | Admitting: Urology

## 2014-04-11 ENCOUNTER — Encounter (HOSPITAL_BASED_OUTPATIENT_CLINIC_OR_DEPARTMENT_OTHER)
Admission: RE | Disposition: A | Discharge status: Home / Self Care | Payer: Self-pay | Source: Ambulatory Visit | Attending: Urology

## 2014-04-11 ENCOUNTER — Encounter (HOSPITAL_BASED_OUTPATIENT_CLINIC_OR_DEPARTMENT_OTHER): Payer: Self-pay

## 2014-04-11 ENCOUNTER — Ambulatory Visit (HOSPITAL_COMMUNITY)
Admission: RE | Admit: 2014-04-11 | Discharge: 2014-04-11 | Disposition: A | Discharge status: Home / Self Care | Payer: BC Managed Care – PPO | Source: Ambulatory Visit | Attending: Urology | Admitting: Urology

## 2014-04-11 DIAGNOSIS — N132 Hydronephrosis with renal and ureteral calculous obstruction: Secondary | ICD-10-CM

## 2014-04-11 DIAGNOSIS — N529 Male erectile dysfunction, unspecified: Secondary | ICD-10-CM | POA: Diagnosis not present

## 2014-04-11 DIAGNOSIS — N210 Calculus in bladder: Secondary | ICD-10-CM | POA: Diagnosis not present

## 2014-04-11 DIAGNOSIS — M1990 Unspecified osteoarthritis, unspecified site: Secondary | ICD-10-CM | POA: Diagnosis not present

## 2014-04-11 DIAGNOSIS — F17210 Nicotine dependence, cigarettes, uncomplicated: Secondary | ICD-10-CM | POA: Insufficient documentation

## 2014-04-11 DIAGNOSIS — N200 Calculus of kidney: Secondary | ICD-10-CM | POA: Insufficient documentation

## 2014-04-11 DIAGNOSIS — Z79899 Other long term (current) drug therapy: Secondary | ICD-10-CM | POA: Diagnosis not present

## 2014-04-11 DIAGNOSIS — Z880 Allergy status to penicillin: Secondary | ICD-10-CM | POA: Diagnosis not present

## 2014-04-11 DIAGNOSIS — L639 Alopecia areata, unspecified: Secondary | ICD-10-CM | POA: Diagnosis not present

## 2014-04-11 DIAGNOSIS — E559 Vitamin D deficiency, unspecified: Secondary | ICD-10-CM | POA: Insufficient documentation

## 2014-04-11 DIAGNOSIS — I10 Essential (primary) hypertension: Secondary | ICD-10-CM | POA: Insufficient documentation

## 2014-04-11 HISTORY — PX: HOLMIUM LASER APPLICATION: SHX5852

## 2014-04-11 HISTORY — PX: CYSTOSCOPY WITH URETEROSCOPY: SHX5123

## 2014-04-11 HISTORY — PX: CYSTOSCOPY W/ URETERAL STENT PLACEMENT: SHX1429

## 2014-04-11 LAB — POCT I-STAT 4, (NA,K, GLUC, HGB,HCT)
Glucose, Bld: 121 mg/dL — ABNORMAL HIGH (ref 70–99)
HCT: 39 % (ref 39.0–52.0)
HEMOGLOBIN: 13.3 g/dL (ref 13.0–17.0)
Potassium: 4.4 mEq/L (ref 3.7–5.3)
SODIUM: 137 meq/L (ref 137–147)

## 2014-04-11 SURGERY — HOLMIUM LASER APPLICATION
Anesthesia: General | Site: Ureter | Laterality: Left

## 2014-04-11 MED ORDER — OXYCODONE-ACETAMINOPHEN 5-325 MG PO TABS: 1.0000 | ORAL_TABLET | ORAL | Status: DC | PRN

## 2014-04-11 MED ORDER — SODIUM CHLORIDE 0.9 % IJ SOLN
3.0000 mL | Freq: Two times a day (BID) | INTRAMUSCULAR | Status: DC
  Filled 2014-04-11: qty 3

## 2014-04-11 MED ORDER — DEXAMETHASONE SODIUM PHOSPHATE 4 MG/ML IJ SOLN
INTRAMUSCULAR | Status: DC | PRN
  Administered 2014-04-11: 4 mg via INTRAVENOUS

## 2014-04-11 MED ORDER — FENTANYL CITRATE 0.05 MG/ML IJ SOLN
25.0000 ug | INTRAMUSCULAR | Status: DC | PRN
  Filled 2014-04-11: qty 1

## 2014-04-11 MED ORDER — SODIUM CHLORIDE 0.9 % IV SOLN
250.0000 mL | INTRAVENOUS | Status: DC | PRN
  Filled 2014-04-11: qty 250

## 2014-04-11 MED ORDER — FENTANYL CITRATE 0.05 MG/ML IJ SOLN
INTRAMUSCULAR | Status: AC
  Filled 2014-04-11: qty 4

## 2014-04-11 MED ORDER — MIDAZOLAM HCL 5 MG/5ML IJ SOLN
INTRAMUSCULAR | Status: DC | PRN
  Administered 2014-04-11: 2 mg via INTRAVENOUS

## 2014-04-11 MED ORDER — ACETAMINOPHEN 325 MG PO TABS
650.0000 mg | ORAL_TABLET | ORAL | Status: DC | PRN
  Filled 2014-04-11: qty 2

## 2014-04-11 MED ORDER — PROPOFOL 10 MG/ML IV BOLUS
INTRAVENOUS | Status: DC | PRN
  Administered 2014-04-11: 190 mg via INTRAVENOUS

## 2014-04-11 MED ORDER — PROMETHAZINE HCL 25 MG/ML IJ SOLN
6.2500 mg | INTRAMUSCULAR | Status: DC | PRN
  Filled 2014-04-11: qty 1

## 2014-04-11 MED ORDER — MIDAZOLAM HCL 2 MG/2ML IJ SOLN
INTRAMUSCULAR | Status: AC
  Filled 2014-04-11: qty 2

## 2014-04-11 MED ORDER — OXYCODONE HCL 5 MG PO TABS
5.0000 mg | ORAL_TABLET | ORAL | Status: DC | PRN
  Filled 2014-04-11: qty 2

## 2014-04-11 MED ORDER — ONDANSETRON HCL 4 MG/2ML IJ SOLN
INTRAMUSCULAR | Status: DC | PRN
  Administered 2014-04-11: 4 mg via INTRAVENOUS

## 2014-04-11 MED ORDER — EPHEDRINE SULFATE 50 MG/ML IJ SOLN
INTRAMUSCULAR | Status: DC | PRN
  Administered 2014-04-11 (×2): 10 mg via INTRAVENOUS

## 2014-04-11 MED ORDER — BELLADONNA ALKALOIDS-OPIUM 16.2-60 MG RE SUPP
RECTAL | Status: AC
  Filled 2014-04-11: qty 1

## 2014-04-11 MED ORDER — CIPROFLOXACIN IN D5W 400 MG/200ML IV SOLN
400.0000 mg | INTRAVENOUS | Status: AC
  Administered 2014-04-11: 400 mg via INTRAVENOUS
  Filled 2014-04-11: qty 200

## 2014-04-11 MED ORDER — ACETAMINOPHEN 650 MG RE SUPP
650.0000 mg | RECTAL | Status: DC | PRN
  Filled 2014-04-11: qty 1

## 2014-04-11 MED ORDER — SODIUM CHLORIDE 0.9 % IJ SOLN
3.0000 mL | INTRAMUSCULAR | Status: DC | PRN
  Filled 2014-04-11: qty 3

## 2014-04-11 MED ORDER — CIPROFLOXACIN IN D5W 400 MG/200ML IV SOLN
INTRAVENOUS | Status: AC
  Filled 2014-04-11: qty 200

## 2014-04-11 MED ORDER — LIDOCAINE HCL (CARDIAC) 20 MG/ML IV SOLN
INTRAVENOUS | Status: DC | PRN
  Administered 2014-04-11: 80 mg via INTRAVENOUS

## 2014-04-11 MED ORDER — LACTATED RINGERS IV SOLN
INTRAVENOUS | Status: DC
  Administered 2014-04-11: 07:00:00 via INTRAVENOUS
  Filled 2014-04-11: qty 1000

## 2014-04-11 MED ORDER — FENTANYL CITRATE 0.05 MG/ML IJ SOLN
INTRAMUSCULAR | Status: DC | PRN
  Administered 2014-04-11 (×2): 50 ug via INTRAVENOUS

## 2014-04-11 MED ORDER — SODIUM CHLORIDE 0.9 % IR SOLN
Status: DC | PRN
  Administered 2014-04-11: 7000 mL

## 2014-04-11 MED ORDER — ACETAMINOPHEN 10 MG/ML IV SOLN
INTRAVENOUS | Status: DC | PRN
  Administered 2014-04-11: 1000 mg via INTRAVENOUS

## 2014-04-11 MED ORDER — PHENAZOPYRIDINE HCL 200 MG PO TABS: 200.0000 mg | ORAL_TABLET | Freq: Three times a day (TID) | ORAL | Status: DC | PRN

## 2014-04-11 MED ORDER — BELLADONNA ALKALOIDS-OPIUM 16.2-60 MG RE SUPP
RECTAL | Status: DC | PRN
  Administered 2014-04-11: 1 via RECTAL

## 2014-04-11 SURGICAL SUPPLY — 47 items
ADAPTER CATH URET PLST 4-6FR (CATHETERS) IMPLANT
ADPR CATH URET STRL DISP 4-6FR (CATHETERS)
BAG DRAIN URO-CYSTO SKYTR STRL (DRAIN) ×4 IMPLANT
BAG DRN UROCATH (DRAIN) ×2
BASKET LASER NITINOL 1.9FR (BASKET) IMPLANT
BASKET STNLS GEMINI 4WIRE 3FR (BASKET) IMPLANT
BASKET STONE NCOMPASS (UROLOGICAL SUPPLIES) ×3 IMPLANT
BASKET ZERO TIP NITINOL 2.4FR (BASKET) IMPLANT
BSKT STON RTRVL 120 1.9FR (BASKET)
BSKT STON RTRVL GEM 120X11 3FR (BASKET)
BSKT STON RTRVL ZERO TP 2.4FR (BASKET)
CANISTER SUCT LVC 12 LTR MEDI- (MISCELLANEOUS) ×4 IMPLANT
CATH INTERMIT  6FR 70CM (CATHETERS) IMPLANT
CATH URET 5FR 28IN CONE TIP (BALLOONS)
CATH URET 5FR 28IN OPEN ENDED (CATHETERS) IMPLANT
CATH URET 5FR 70CM CONE TIP (BALLOONS) IMPLANT
CLOTH BEACON ORANGE TIMEOUT ST (SAFETY) ×4 IMPLANT
DRAPE CAMERA CLOSED 9X96 (DRAPES) ×4 IMPLANT
ELECTROHYDROLIC PROBE 9FR (MISCELLANEOUS) IMPLANT
FIBER LASER FLEXIVA 1000 (UROLOGICAL SUPPLIES) ×3 IMPLANT
FIBER LASER FLEXIVA 200 (UROLOGICAL SUPPLIES) IMPLANT
FIBER LASER FLEXIVA 365 (UROLOGICAL SUPPLIES) IMPLANT
FIBER LASER FLEXIVA 550 (UROLOGICAL SUPPLIES) IMPLANT
FIBER LASER TRAC TIP (UROLOGICAL SUPPLIES) ×3 IMPLANT
GLOVE SURG SS PI 7.5 STRL IVOR (GLOVE) ×6 IMPLANT
GLOVE SURG SS PI 8.0 STRL IVOR (GLOVE) ×4 IMPLANT
GOWN STRL REIN XL XLG (GOWN DISPOSABLE) ×4 IMPLANT
GOWN STRL REUS W/TWL XL LVL3 (GOWN DISPOSABLE) ×6 IMPLANT
GOWN XL W/COTTON TOWEL STD (GOWNS) ×4 IMPLANT
GUIDEWIRE 0.038 PTFE COATED (WIRE) IMPLANT
GUIDEWIRE ANG ZIPWIRE 038X150 (WIRE) IMPLANT
GUIDEWIRE STR DUAL SENSOR (WIRE) ×3 IMPLANT
IV NS 1000ML (IV SOLUTION) ×4
IV NS 1000ML BAXH (IV SOLUTION) ×1 IMPLANT
IV NS IRRIG 3000ML ARTHROMATIC (IV SOLUTION) ×6 IMPLANT
KIT BALLIN UROMAX 15FX10 (LABEL) IMPLANT
KIT BALLN UROMAX 15FX4 (MISCELLANEOUS) IMPLANT
KIT BALLN UROMAX 26 75X4 (MISCELLANEOUS)
PACK CYSTO (CUSTOM PROCEDURE TRAY) ×4 IMPLANT
PROBE LITHO 3.3FR 2137.235 (UROLOGICAL SUPPLIES) IMPLANT
PROBE LITHO 5.0FR 2137.1505 (MISCELLANEOUS) IMPLANT
SET HIGH PRES BAL DIL (LABEL)
SHEATH ACCESS URETERAL 38CM (SHEATH) ×3 IMPLANT
SHEATH URET ACCESS 12FR/35CM (UROLOGICAL SUPPLIES) IMPLANT
SHEATH URET ACCESS 12FR/55CM (UROLOGICAL SUPPLIES) IMPLANT
STENT URET 6FRX26 CONTOUR (STENTS) ×3 IMPLANT
WATER STERILE IRR 500ML POUR (IV SOLUTION) IMPLANT

## 2014-04-11 NOTE — Interval H&P Note (Signed)
History and Physical Interval Note:  04/11/2014 7:23 AM  Chad Gomez  has presented today for surgery, with the diagnosis of LEFT RENAL/BLADDER STONES  The various methods of treatment have been discussed with the patient and family. After consideration of risks, benefits and other options for treatment, the patient has consented to  Procedure(s): CYSTOSCOPY WITH LITHOLAPAXY/LEFT URETEROSCOPY WITH STONE EXTRACTION AND STENT (N/A) HOLMIUM LASER APPLICATION (N/A) as a surgical intervention .  The patient's history has been reviewed, patient examined, no change in status, stable for surgery.  I have reviewed the patient's chart and labs.  Questions were answered to the patient's satisfaction.     [name]

## 2014-04-11 NOTE — Anesthesia Preprocedure Evaluation (Signed)
Anesthesia Evaluation  Patient identified by MRN, date of birth, ID band Patient awake    Reviewed: Allergy & Precautions, H&P , NPO status , Patient's Chart, lab work & pertinent test results  Airway Mallampati: II TM Distance: >3 FB Neck ROM: Full    Dental no notable dental hx.    Pulmonary Current Smoker,  breath sounds clear to auscultation  Pulmonary exam normal       Cardiovascular hypertension, Pt. on medications Rhythm:Regular Rate:Normal     Neuro/Psych negative neurological ROS  negative psych ROS   GI/Hepatic negative GI ROS, Neg liver ROS,   Endo/Other  negative endocrine ROS  Renal/GU negative Renal ROS  negative genitourinary   Musculoskeletal negative musculoskeletal ROS (+)   Abdominal   Peds negative pediatric ROS (+)  Hematology negative hematology ROS (+)   Anesthesia Other Findings   Reproductive/Obstetrics negative OB ROS                           Anesthesia Physical Anesthesia Plan  ASA: II  Anesthesia Plan: General   Post-op Pain Management:    Induction: Intravenous  Airway Management Planned: LMA  Additional Equipment:   Intra-op Plan:   Post-operative Plan: Extubation in OR  Informed Consent: I have reviewed the patients History and Physical, chart, labs and discussed the procedure including the risks, benefits and alternatives for the proposed anesthesia with the patient or authorized representative who has indicated his/her understanding and acceptance.   Dental advisory given  Plan Discussed with: CRNA and Surgeon  Anesthesia Plan Comments:         Anesthesia Quick Evaluation

## 2014-04-11 NOTE — Anesthesia Postprocedure Evaluation (Signed)
  Anesthesia Post-op Note  Patient: Chad Gomez  Procedure(s) Performed: Procedure(s) (LRB): HOLMIUM LASER APPLICATION (Left) CYSTOSCOPY WITH STENT REPLACEMENT (Left) CYSTOSCOPY WITH URETEROSCOPY/ STONE EXTRACTION (Left)  Patient Location: PACU  Anesthesia Type: General  Level of Consciousness: awake and alert   Airway and Oxygen Therapy: Patient Spontanous Breathing  Post-op Pain: mild  Post-op Assessment: Post-op Vital signs reviewed, Patient's Cardiovascular Status Stable, Respiratory Function Stable, Patent Airway and No signs of Nausea or vomiting  Last Vitals:  Filed Vitals:   04/11/14 0915  BP: 117/71  Pulse: 98  Temp:   Resp: 19    Post-op Vital Signs: stable   Complications: No apparent anesthesia complications

## 2014-04-11 NOTE — Discharge Instructions (Signed)
Ureteral Stent Implantation, Care After Refer to this sheet in the next few weeks. These instructions provide you with information on caring for yourself after your procedure. Your health care provider may also give you more specific instructions. Your treatment has been planned according to current medical practices, but problems sometimes occur. Call your health care provider if you have any problems or questions after your procedure. WHAT TO EXPECT AFTER THE PROCEDURE You should be back to normal activity within 48 hours after the procedure. Nausea and vomiting may occur and are commonly the result of anesthesia. It is common to experience sharp pain in the back or lower abdomen and penis with voiding. This is caused by movement of the ends of the stent with the act of urinating.It usually goes away within minutes after you have stopped urinating. HOME CARE INSTRUCTIONS Make sure to drink plenty of fluids. You may have small amounts of bleeding, causing your urine to be red. This is normal. Certain movements may trigger pain or a feeling that you need to urinate. You may be given medicines to prevent infection or bladder spasms. Be sure to take all medicines as directed. Only take over-the-counter or prescription medicines for pain, discomfort, or fever as directed by your health care provider. Do not take aspirin, as this can make bleeding worse. Your stent will be left in until the blockage is resolved. This may take 2 weeks or longer, depending on the reason for stent implantation. You may have an X-ray exam to make sure your ureter is open and that the stent has not moved out of position (migrated). The stent can be removed by your health care provider in the office. Medicines may be given for comfort while the stent is being removed. Be sure to keep all follow-up appointments so your health care provider can check that you are healing properly. SEEK MEDICAL CARE IF:  You experience increasing  pain.  Your pain medicine is not working. SEEK IMMEDIATE MEDICAL CARE IF:  Your urine is dark red or has blood clots.  You are leaking urine (incontinent).  You have a fever, chills, feeling sick to your stomach (nausea), or vomiting.  Your pain is not relieved by pain medicine.  The end of the stent comes out of the urethra.  You are unable to urinate. Document Released: 02/09/2013 Document Revised: 06/14/2013 Document Reviewed: 02/09/2013 [facility] Patient Information 2015 [place], [place]. This information is not intended to replace advice given to you by your health care provider. Make sure you discuss any questions you have with your health care provider.    Post Anesthesia Home Care Instructions  Activity: Get plenty of rest for the remainder of the day. A responsible adult should stay with you for 24 hours following the procedure.  For the next 24 hours, DO NOT: -Drive a car -Paediatric nurse -Drink alcoholic beverages -Take any medication unless instructed by your physician -Make any legal decisions or sign important papers.  Meals: Start with liquid foods such as gelatin or soup. Progress to regular foods as tolerated. Avoid greasy, spicy, heavy foods. If nausea and/or vomiting occur, drink only clear liquids until the nausea and/or vomiting subsides. Call your physician if vomiting continues.  Special Instructions/Symptoms: Your throat may feel dry or sore from the anesthesia or the breathing tube placed in your throat during surgery. If this causes discomfort, gargle with warm salt water. The discomfort should disappear within 24 hours.

## 2014-04-11 NOTE — Transfer of Care (Signed)
Immediate Anesthesia Transfer of Care Note  Patient: Chad Gomez  Procedure(s) Performed: Procedure(s): HOLMIUM LASER APPLICATION (Left) CYSTOSCOPY WITH STENT REPLACEMENT (Left) CYSTOSCOPY WITH URETEROSCOPY/ STONE EXTRACTION (Left)  Patient Location: PACU  Anesthesia Type:General  Level of Consciousness: awake and oriented  Airway & Oxygen Therapy: Patient Spontanous Breathing and Patient connected to nasal cannula oxygen  Post-op Assessment: Report given to PACU RN  Post vital signs: Reviewed and stable  Complications: No apparent anesthesia complications

## 2014-04-11 NOTE — Op Note (Deleted)
NAMEMarland Kitchen  Chad Gomez, Chad Gomez              ACCOUNT NO.:  [id number]  MEDICAL RECORD NO.:  [id number]  LOCATION:                                 FACILITY:  PHYSICIAN:  Marshall Cork. Jeffie Pollock, M.D.    DATE OF BIRTH:  [birth year]  DATE OF PROCEDURE:  04/11/2014 DATE OF DISCHARGE:  04/11/2014                              OPERATIVE REPORT   PROCEDURE: 1. Cystolitholapaxy of multiple bladder stones with combined dimension     greater than 2.5 cm. 2. Left ureterostomy with holmium laser trips and stone extraction. 3. Removal and replacement of left double-J stent.  PREOPERATIVE DIAGNOSES:  Multiple bladder stones greater than 2.5 cm total greatest diameter and 2-cm left renal stones.  POSTOPERATIVE DIAGNOSES:  Multiple bladder stones greater than 2.5 cm total greatest diameter and 2-cm left renal stones.  SURGEON:  Marshall Cork. Jeffie Pollock, MD  ANESTHESIA:  General.  SPECIMEN:  Stone fragments.  DRAINS:  A 6-French 26-cm left double-J stent.  COMPLICATIONS:  None.  INDICATIONS:  Chad Gomez is a 68 year old white male with recurrent urolithiasis, who recently presented with an obstructing left UPJ stone. He underwent stenting several days ago and is now to undergo definitive therapy.  He was also noted to have multiple bladder stones.  It was felt that cystoscopic and ureteroscopic approach was indicated.  FINDINGS OF PROCEDURES:  He was given Cipro, was taken to the operating room where general anesthetic was induced.  He was placed in lithotomy position and fitted with PAS hose.  His perineum and genitalia were prepped with Betadine solution and draped in usual sterile fashion.  Cystoscopy was performed using a 22-French scope and 12-degree lens. Examination revealed a normal urethra.  The external sphincter was intact.  The prostatic urethra had trilobar hyperplasia with obstruction.  Examination of the bladder revealed moderate trabeculation with some cellules.  No diverticula were noted.  No tumors  were noted. There were multiple stones at the base of the bladder measuring from approximately 5 mm to a centimeter with a total stone diameter in excess of 2.5 cm.  Some of the smaller stones were aspirated through the cystoscope intact. I then inserted a 1000 micron laser fiber, this was set on 1 watt and 50 hertz, and the stones were engaged and broken in 2 smaller fragments which were then aspirated from the bladder with the cystoscope.  Great care was taken to remove all significant fragments.  A small amount of dust remaining but I did all I could to remove as much as possible.  Once the bladder stone had been removed, the left ureteral stent was grasped with grasping forceps and pulled through the urethral meatus.  A guidewire was then passed to the kidney and the stent was removed.  A 12 x 14 x 38 cm digital access sheath was then inserted over the wire and easily passed to the kidney.  The inner core of the wire were then removed.  A digital flexible ureteroscope was then passed.  The stone was identified in the lower pole.  The kidneys had been noted on KUB. There appeared to be approximately 4 stones with the largest about 2 cm in size.  Once  the stones were identified, a 200 micron TracTip laser fiber was passed through  ureteroscope was set on 0.5 watts at 50 hertz and the stone was engaged.  It broke easily into manageable fragments and dust.  The larger fragments were then removed with an NCompass basket. However, due to this fine nature of the residuals and possibility to remove all the fragments, efforts were made to irrigate as much of the material out of the kidney as possible, however, due to the dependent location of the stone fragments I could not completely clear the kidney despite placing the patient in a steep Trendelenburg position.  Once the stones had been adequately fragmented and all sizable fragments had been removed with the basket, the  ureteroscope was removed and a guidewire was reinserted to the kidney.  The access sheath was removed. The cystoscope was reinserted over the wire and a 6-French 26-cm double- J stent with string was then passed without difficulty to the kidney under fluoroscopic guidance.  The wire was removed leaving good coil in the kidney and good coil in the bladder.  The bladder was drained.  The stent string was left exiting the urethra and was secured to the patient's penis with Tegaderm.  A B and O suppository had been placed at the beginning of procedure.  The patient was taken down from lithotomy position.  His anesthetic was reversed.  He was moved to recovery room in stable condition.  There were no complications.     Marshall Cork. Jeffie Pollock, M.D.     JJW/MEDQ  D:  04/11/2014  T:  04/11/2014  Job:  [id number]

## 2014-04-11 NOTE — Anesthesia Procedure Notes (Signed)
Procedure Name: LMA Insertion Date/Time: 04/11/2014 7:37 AM Performed by: Bethena Roys T Pre-anesthesia Checklist: Patient identified, Emergency Drugs available, Suction available and Patient being monitored Patient Re-evaluated:Patient Re-evaluated prior to inductionOxygen Delivery Method: Circle System Utilized Preoxygenation: Pre-oxygenation with 100% oxygen Intubation Type: IV induction Ventilation: Mask ventilation without difficulty LMA: LMA inserted LMA Size: 5.0 Number of attempts: 1 Airway Equipment and Method: bite block Placement Confirmation: positive ETCO2 Tube secured with: Tape Dental Injury: Teeth and Oropharynx as per pre-operative assessment

## 2014-04-11 NOTE — Brief Op Note (Signed)
04/11/2014  8:39 AM  PATIENT:  Chad Gomez  68 y.o. male  PRE-OPERATIVE DIAGNOSIS:  LEFT RENAL/BLADDER STONES >2.5cm  POST-OPERATIVE DIAGNOSIS:  LEFT RENAL/BLADDER STONES> 2.5cm  PROCEDURE:  Procedure(s): HOLMIUM LASER APPLICATION (Left) CYSTOSCOPY WITH STENT REPLACEMENT (Left) CYSTOSCOPY WITH URETEROSCOPY/ STONE EXTRACTION (Left) CYSTOLITHALOPAXY >2.5cm  SURGEON:  Surgeon(s) and Role:    * Malka So, MD - Primary  PHYSICIAN ASSISTANT:   ASSISTANTS: none   ANESTHESIA:   general  EBL:  Total I/O In: 200 [I.V.:200] Out: -   BLOOD ADMINISTERED:none  DRAINS: 6 x 26 left JJ stent   LOCAL MEDICATIONS USED:  NONE  SPECIMEN:  Source of Specimen:  bladder stone fragments  DISPOSITION OF SPECIMEN:  to family  COUNTS:  YES  TOURNIQUET:  * No tourniquets in log *  DICTATION: .Other Dictation: Dictation Number [id number]  PLAN OF CARE: Discharge to home after PACU  PATIENT DISPOSITION:  PACU - hemodynamically stable.   Delay start of Pharmacological VTE agent (>24hrs) due to surgical blood loss or risk of bleeding: yes

## 2014-04-11 NOTE — Op Note (Signed)
NAMEMarland Gomez  FACUNDO, ALLEMAND              ACCOUNT NO.:  [id number]  MEDICAL RECORD NO.:  [id number]  LOCATION:                                 FACILITY:  PHYSICIAN:  Marshall Cork. Jeffie Pollock, M.D.    DATE OF BIRTH:  1946/01/01  DATE OF PROCEDURE:  04/11/2014 DATE OF DISCHARGE:  04/11/2014                              OPERATIVE REPORT   PROCEDURE: 1. Cystolitholapaxy of multiple bladder stones with combined dimension     greater than 2.5 cm. 2. Left ureterostomy with holmium laser trips and stone extraction. 3. Removal and replacement of left double-J stent.  PREOPERATIVE DIAGNOSES:  Multiple bladder stones greater than 2.5 cm total greatest diameter and 2-cm left renal stones.  POSTOPERATIVE DIAGNOSES:  Multiple bladder stones greater than 2.5 cm total greatest diameter and 2-cm left renal stones.  SURGEON:  Marshall Cork. Jeffie Pollock, MD  ANESTHESIA:  General.  SPECIMEN:  Stone fragments.  DRAINS:  A 6-French 26-cm left double-J stent.  COMPLICATIONS:  None.  INDICATIONS:  Mr. Gartman is a 68 year old white male with recurrent urolithiasis, who recently presented with an obstructing left UPJ stone. He underwent stenting several days ago and is now to undergo definitive therapy.  He was also noted to have multiple bladder stones.  It was felt that cystoscopic and ureteroscopic approach was indicated.  FINDINGS OF PROCEDURES:  He was given Cipro, was taken to the operating room where general anesthetic was induced.  He was placed in lithotomy position and fitted with PAS hose.  His perineum and genitalia were prepped with Betadine solution and draped in usual sterile fashion.  Cystoscopy was performed using a 22-French scope and 12-degree lens. Examination revealed a normal urethra.  The external sphincter was intact.  The prostatic urethra had trilobar hyperplasia with obstruction.  Examination of the bladder revealed moderate trabeculation with some cellules.  No diverticula were noted.  No tumors  were noted. There were multiple stones at the base of the bladder measuring from approximately 5 mm to a centimeter with a total stone diameter in excess of 2.5 cm.  Some of the smaller stones were aspirated through the cystoscope intact. I then inserted a 1000 micron laser fiber, this was set on 1 watt and 50 hertz, and the stones were engaged and broken in 2 smaller fragments which were then aspirated from the bladder with the cystoscope.  Great care was taken to remove all significant fragments.  A small amount of dust remaining but I did all I could to remove as much as possible.  Once the bladder stone had been removed, the left ureteral stent was grasped with grasping forceps and pulled through the urethral meatus.  A guidewire was then passed to the kidney and the stent was removed.  A 12 x 14 x 38 cm digital access sheath was then inserted over the wire and easily passed to the kidney.  The inner core of the wire were then removed.  A digital flexible ureteroscope was then passed.  The stone was identified in the lower pole.  The kidneys had been noted on KUB. There appeared to be approximately 4 stones with the largest about 2 cm in size.  Once  the stones were identified, a 200 micron TracTip laser fiber was passed through  ureteroscope was set on 0.5 watts at 50 hertz and the stone was engaged.  It broke easily into manageable fragments and dust.  The larger fragments were then removed with an NCompass basket. However, due to this fine nature of the residuals and possibility to remove all the fragments, efforts were made to irrigate as much of the material out of the kidney as possible, however, due to the dependent location of the stone fragments I could not completely clear the kidney despite placing the patient in a steep Trendelenburg position.  Once the stones had been adequately fragmented and all sizable fragments had been removed with the basket, the  ureteroscope was removed and a guidewire was reinserted to the kidney.  The access sheath was removed. The cystoscope was reinserted over the wire and a 6-French 26-cm double- J stent with string was then passed without difficulty to the kidney under fluoroscopic guidance.  The wire was removed leaving good coil in the kidney and good coil in the bladder.  The bladder was drained.  The stent string was left exiting the urethra and was secured to the patient's penis with Tegaderm.  A B and O suppository had been placed at the beginning of procedure.  The patient was taken down from lithotomy position.  His anesthetic was reversed.  He was moved to recovery room in stable condition.  There were no complications.     Marshall Cork. Jeffie Pollock, M.D.     JJW/MEDQ  D:  04/11/2014  T:  04/11/2014  Job:  [id number]

## 2014-04-13 ENCOUNTER — Encounter (HOSPITAL_BASED_OUTPATIENT_CLINIC_OR_DEPARTMENT_OTHER): Payer: Self-pay | Admitting: Urology

## 2014-04-27 ENCOUNTER — Ambulatory Visit: Admit: 2014-04-27 | Payer: Self-pay | Admitting: Urology

## 2014-04-27 ENCOUNTER — Other Ambulatory Visit (HOSPITAL_COMMUNITY): Payer: BC Managed Care – PPO

## 2014-04-27 ENCOUNTER — Ambulatory Visit (HOSPITAL_COMMUNITY): Payer: BC Managed Care – PPO

## 2014-04-27 SURGERY — NEPHROLITHOTOMY PERCUTANEOUS
Anesthesia: General

## 2014-05-23 ENCOUNTER — Other Ambulatory Visit: Payer: Self-pay | Admitting: Internal Medicine

## 2014-05-24 ENCOUNTER — Other Ambulatory Visit: Payer: Self-pay | Admitting: Internal Medicine

## 2014-05-24 DIAGNOSIS — M6289 Other specified disorders of muscle: Secondary | ICD-10-CM

## 2014-06-14 ENCOUNTER — Ambulatory Visit
Admission: RE | Admit: 2014-06-14 | Discharge: 2014-06-14 | Disposition: A | Discharge status: Home / Self Care | Payer: BC Managed Care – PPO | Source: Ambulatory Visit | Attending: Internal Medicine | Admitting: Internal Medicine

## 2014-06-14 DIAGNOSIS — M6289 Other specified disorders of muscle: Secondary | ICD-10-CM

## 2014-07-05 ENCOUNTER — Other Ambulatory Visit: Payer: Self-pay | Admitting: *Deleted

## 2014-07-05 DIAGNOSIS — M79606 Pain in leg, unspecified: Secondary | ICD-10-CM

## 2014-07-07 DIAGNOSIS — N390 Urinary tract infection, site not specified: Secondary | ICD-10-CM | POA: Diagnosis not present

## 2014-08-11 ENCOUNTER — Encounter: Payer: Self-pay | Admitting: Surgery

## 2014-08-14 ENCOUNTER — Ambulatory Visit (HOSPITAL_COMMUNITY)
Admission: RE | Admit: 2014-08-14 | Discharge: 2014-08-14 | Disposition: A | Discharge status: Home / Self Care | Payer: BLUE CROSS/BLUE SHIELD | Source: Ambulatory Visit | Attending: Surgery | Admitting: Surgery

## 2014-08-14 ENCOUNTER — Ambulatory Visit (INDEPENDENT_AMBULATORY_CARE_PROVIDER_SITE_OTHER)
Admission: RE | Admit: 2014-08-14 | Discharge: 2014-08-14 | Disposition: A | Discharge status: Home / Self Care | Payer: BLUE CROSS/BLUE SHIELD | Source: Ambulatory Visit | Attending: Surgery | Admitting: Surgery

## 2014-08-14 ENCOUNTER — Ambulatory Visit (INDEPENDENT_AMBULATORY_CARE_PROVIDER_SITE_OTHER): Payer: BLUE CROSS/BLUE SHIELD | Admitting: Surgery

## 2014-08-14 ENCOUNTER — Encounter: Payer: Self-pay | Admitting: Surgery

## 2014-08-14 VITALS — BP 126/83 | HR 77 | Ht 70.0 in | Wt 176.0 lb

## 2014-08-14 DIAGNOSIS — M79606 Pain in leg, unspecified: Secondary | ICD-10-CM

## 2014-08-14 DIAGNOSIS — I70219 Atherosclerosis of native arteries of extremities with intermittent claudication, unspecified extremity: Secondary | ICD-10-CM | POA: Diagnosis not present

## 2014-08-14 MED ORDER — CILOSTAZOL 100 MG PO TABS: 100.0000 mg | ORAL_TABLET | Freq: Two times a day (BID) | ORAL | Status: DC

## 2014-08-14 NOTE — Addendum Note (Signed)
Addended by: Mena Goes on: 08/14/2014 12:13 PM   Modules accepted: Orders

## 2014-08-14 NOTE — Progress Notes (Signed)
Patient name: DONDRELL LOUDERMILK MRN: [id number] DOB: 1946-01-09 Sex: male   Referred by: Dr. Shelia Media  Reason for referral:  Chief Complaint  Patient presents with  . New Evaluation    abd abi's - c/o bilateral LE pain when ambulating L>R    HISTORY OF PRESENT ILLNESS: This is a very pleasant 69 year old gentleman who comes in today for evaluation of claudication, particularly in the left leg.  The patient states that for many years he has been having trouble with his left leg.  He states that at approximately 50 yards his leg gets tight and he cannot use it.  His symptoms go away within 2 minutes.  Stairs make his symptoms worse.  He also complains of pain in both knees.  He denies rest pain or nonhealing wounds  The patient has a long-standing history of hypertension.  With 2 medications including an ACE inhibitor his blood pressure is in the 120s now.  Historically it had been in the low 200s.  The patient suffers from hypercholesterolemia which is managed with a statin.  He is a 2 pack per day smoker.  Past Medical History  Diagnosis Date  . Hypertension   . Iron deficiency   . History of bladder stone   . Frequency of urination   . Nocturia   . Seasonal allergies   . BPH (benign prostatic hypertrophy)   . Right ureteral stone   . History of melanoma excision     LEFT EAR  . Bladder stone   . History of kidney stones   . History of colon polyps     08/2013   pre-canerous  . Complication of anesthesia     urinary retention  . Frequency of urination   . Weak urinary stream   . Wears glasses   . Full dentures   . Chronic kidney disease     Past Surgical History  Procedure Laterality Date  . Cystolithalopaxy of bladder stone/ transrectal ultrasound prostate bx  08-19-2005  . Cystoscopy with litholapaxy Right 12/02/2012    Procedure: CYSTOSCOPY WITH stone extraction;  Surgeon: Malka So, MD;  Location: [facility];  Service: Urology;  Laterality: Right;   . Ureteroscopy Right 12/02/2012    Procedure: RIGHT URETEROSCOPY STONE EXTRACTION WITH  STENT PLACEMENT;  Surgeon: Malka So, MD;  Location: [facility];  Service: Urology;  Laterality: Right;  . Holmium laser application Right 5/40/0867    Procedure: HOLMIUM LASER APPLICATION;  Surgeon: Malka So, MD;  Location: [facility];  Service: Urology;  Laterality: Right;  . Cystoscopy w/ retrogrades Right 12/02/2012    Procedure: CYSTOSCOPY WITH RETROGRADE PYELOGRAM;  Surgeon: Malka So, MD;  Location: [facility];  Service: Urology;  Laterality: Right;  . Nephrolithotomy Left 01/27/2013    Procedure: NEPHROLITHOTOMY PERCUTANEOUS FIRST LOOK;  Surgeon: Malka So, MD;  Location: [facility];  Service: Urology;  Laterality: Left;  Marland Kitchen Mohs surgery  1996    left ear  . Multiple tooth extractions  1994  . Mandible fracture surgery  1995  . Inguinal hernia repair Right 1954  . Colonoscopy w/ polypectomy  09-13-2013  . Cystoscopy with litholapaxy Right 10/13/2013    Procedure: CYSTOSCOPY WITH LITHOLAPAXY, removal of urethral stone with basket;  Surgeon: Irine Seal, MD;  Location: [facility];  Service: Urology;  Laterality: Right;  . Cystoscopy w/ retrogrades Bilateral 10/13/2013    Procedure: CYSTOSCOPY WITH RETROGRADE PYELOGRAM;  Surgeon: Irine Seal, MD;  Location: [facility];  Service: Urology;  Laterality: Bilateral;  . Lithotripsy  2007 & 02/2013  . Cystoscopy with retrograde pyelogram, ureteroscopy and stent placement Left 03/21/2014    Procedure: CYSTOSCOPY WITH RETROGRADE PYELOGRAM, AND LEFT STENT PLACEMENT;  Surgeon: Alexis Frock, MD;  Location: [facility];  Service: Urology;  Laterality: Left;  . Holmium laser application Left 13/01/6577    Procedure: HOLMIUM LASER APPLICATION;  Surgeon: Malka So, MD;  Location: [facility];  Service: Urology;  Laterality: Left;  . Cystoscopy w/ ureteral stent placement  Left 04/11/2014    Procedure: CYSTOSCOPY WITH STENT REPLACEMENT;  Surgeon: Malka So, MD;  Location: [facility];  Service: Urology;  Laterality: Left;  . Cystoscopy with ureteroscopy Left 04/11/2014    Procedure: CYSTOSCOPY WITH URETEROSCOPY/ STONE EXTRACTION;  Surgeon: Malka So, MD;  Location: [facility];  Service: Urology;  Laterality: Left;    History   Social History  . Marital Status: Married    Spouse Name: N/A  . Number of Children: N/A  . Years of Education: N/A   Occupational History  . Not on file.   Social History Main Topics  . Smoking status: Current Every Day Smoker -- 2.00 packs/day for 50 years    Types: Cigarettes  . Smokeless tobacco: Never Used     Comment: PT HAS CUT DOWN TO 1PPD FROM 2 PPD LAST YR (2014)  . Alcohol Use: No  . Drug Use: No  . Sexual Activity: Not on file   Other Topics Concern  . Not on file   Social History Narrative   Patient is married with 2 children.   Patient is right handed.   Patient has some college education.   Patient does not drink caffeine.    Family History  Problem Relation Age of Onset  . Colon cancer Neg Hx   . Dementia Mother   . Hypertension Mother   . Dementia Father   . Hypertension Sister     Allergies as of 08/14/2014 - Review Complete 08/14/2014  Allergen Reaction Noted  . Amoxicillin Other (See Comments) 11/26/2012  . Ibuprofen Swelling 11/26/2012  . Naproxen Swelling 11/26/2012  . Tape Other (See Comments) 09/02/2013    Current Outpatient Prescriptions on File Prior to Visit  Medication Sig Dispense Refill  . amLODipine (NORVASC) 10 MG tablet Take 10 mg by mouth every morning.     . Cyanocobalamin (VITAMIN B-12 PO) Take 1 capsule by mouth daily.    . Ferrous Sulfate (IRON) 325 (65 FE) MG TABS Take 1 tablet by mouth every morning.    . fluticasone (FLONASE) 50 MCG/ACT nasal spray Place 2 sprays into the nose 2 (two) times daily.    Marland Kitchen lisinopril  (PRINIVIL,ZESTRIL) 20 MG tablet Take 20 mg by mouth daily.    Marland Kitchen loratadine (CLARITIN) 10 MG tablet Take 10 mg by mouth daily.    Marland Kitchen aspirin 325 MG tablet Take 325 mg by mouth daily.    . niacin 500 MG tablet Take 500 mg by mouth daily with breakfast.    . oxyCODONE-acetaminophen (ROXICET) 5-325 MG per tablet Take 1 tablet by mouth every 4 (four) hours as needed for severe pain. (Patient not taking: Reported on 08/14/2014) 40 tablet 0  . phenazopyridine (PYRIDIUM) 200 MG tablet Take 1 tablet (200 mg total) by mouth 3 (three) times daily as needed for pain. (Patient not taking: Reported on 08/14/2014) 30 tablet 1  .  POTASSIUM CITRATE PO Take 180 mg by mouth 2 (two) times daily.     Marland Kitchen sulfamethoxazole-trimethoprim (BACTRIM DS) 800-160 MG per tablet Take 1 tablet by mouth 2 (two) times daily. (Patient not taking: Reported on 08/14/2014) 12 tablet 0   No current facility-administered medications on file prior to visit.     REVIEW OF SYSTEMS: Cardiovascular: No chest pain, chest pressure, palpitations, orthopnea, or dyspnea on exertion. No history of DVT or phlebitis.  Positive for left leg claudication Pulmonary: No productive cough, asthma or wheezing. Neurologic: No weakness, paresthesias, aphasia, or amaurosis. No dizziness. Hematologic: No bleeding problems or clotting disorders. Musculoskeletal: Bilateral knee pain. Gastrointestinal: No blood in stool or hematemesis Genitourinary: No dysuria or hematuria. Psychiatric:: No history of major depression. Integumentary: No rashes or ulcers. Constitutional: No fever or chills.  PHYSICAL EXAMINATION: General: The patient appears their stated age.  Vital signs are BP 126/83 mmHg  Pulse 77  Ht 5\' 10"  (1.778 m)  Wt 176 lb (79.833 kg)  BMI 25.25 kg/m2  SpO2 97% HEENT:  No gross abnormalities Pulmonary: Respirations are non-labored Abdomen: Soft and non-tender  Musculoskeletal: There are no major deformities.   Neurologic: No focal weakness or  paresthesias are detected, Skin: There are no ulcer or rashes noted. Psychiatric: The patient has normal affect. Cardiovascular: There is a regular rate and rhythm without significant murmur appreciated.  Nonpalpable left femoral pulse.  Palpable right femoral pulse.  No carotid bruits  Diagnostic Studies: I have reviewed his ultrasound and ABIs.  ABI on the left is 0.65.  On the right is 0.88.  Infrarenal abdominal aortic aneurysm measures 2.9 cm.  He has an occluded left external iliac artery.  There is also a stenosis within the right superficial femoral artery with peak velocity of 170 cm/s    Assessment:  #1: Atherosclerosis with claudication, left leg #2: Abdominal aortic aneurysm Plan: #1: I discussed the importance of initially beginning with medical management to address his claudication.  He is on all of the appropriate medications.  I spent a significant time discussing the importance of smoking cessation to improve long-term outcome of any intervention that is performed.  I have also recommended a trial of cilostazol to see if this improves his walking distance.  He is scheduled to follow-up in 3 months.  If cilostazol does not have any significant benefit the next step would be angiography.  I discussed the possibility of recanalization of his occluded left external iliac artery, versus the need for surgical bypass.  Because he has a small infrarenal aneurysm, aortobifemoral bypass will need to be considered versus a right to left femoral-femoral bypass.     Eldridge Abrahams, M.D. [facility] Office: [phone] Pager:  [phone]

## 2014-08-30 ENCOUNTER — Telehealth: Payer: Self-pay

## 2014-08-30 NOTE — Telephone Encounter (Signed)
Pt's wife called with report that pt. Has been having diarrhea and lightheadedness since starting the Cilostazol.  Initial dose was Cilostazol 100 mg bid.  Advised will discuss with Dr. Trula Slade.  Wife stated pt's. Leg symptoms are a little better since he started it.  Suggested to decrease dose to once/ day, until nurse notifies and receives further recommendations from Dr. Trula Slade.  Advised will call pt. tomorrow with further recommendations.

## 2014-08-31 NOTE — Telephone Encounter (Signed)
Serafina Mitchell, MD  Denman George, RN           Tell him to cut dose in half, ie 50 mg BID, If continues to not be able to tolerate side effects, discontinue

## 2014-08-31 NOTE — Telephone Encounter (Signed)
Notified pt. ; instructed him on cutting Cilostazol 100 mg down to 1/2 tablet (50 mg) bid, and if side effects continue, then to stop the medication.  Pt. verb. understanding.  He will notify office if unable to tolerate lower dosage.

## 2014-09-06 DIAGNOSIS — N200 Calculus of kidney: Secondary | ICD-10-CM | POA: Diagnosis not present

## 2014-09-06 DIAGNOSIS — R8299 Other abnormal findings in urine: Secondary | ICD-10-CM | POA: Diagnosis not present

## 2014-09-06 DIAGNOSIS — N390 Urinary tract infection, site not specified: Secondary | ICD-10-CM | POA: Diagnosis not present

## 2014-09-27 ENCOUNTER — Telehealth: Payer: Self-pay

## 2014-09-27 NOTE — Telephone Encounter (Signed)
Phone call from pt's. wife.  Reported that pt. Is having to leave work a little early on some days due to pain in his left leg.  Was advised by his employer, to have "intermittent FMLA" paperwork completed, to protect his job. Last office evaluation was 08/14/14, and recommended to f/u in 3 mos. (scheduled 11/06/14), following a trial of medical management, and Cilostazol.  Advised wife, pt. will need to be reevaluated to determine recommended course of treatment, and the appropriateness of FMLA.  Will return call to give an earlier appt. with Dr. Monia Sabal w/ plan.

## 2014-09-27 NOTE — Telephone Encounter (Signed)
Spoke with patients wife, Stanton Kidney, to schedule, dpm

## 2014-10-06 ENCOUNTER — Encounter: Payer: Self-pay | Admitting: Surgery

## 2014-10-09 ENCOUNTER — Encounter: Payer: Self-pay | Admitting: Surgery

## 2014-10-09 ENCOUNTER — Ambulatory Visit (INDEPENDENT_AMBULATORY_CARE_PROVIDER_SITE_OTHER): Payer: BLUE CROSS/BLUE SHIELD | Admitting: Surgery

## 2014-10-09 ENCOUNTER — Other Ambulatory Visit: Payer: Self-pay

## 2014-10-09 VITALS — BP 114/75 | HR 100 | Ht 70.0 in | Wt 185.4 lb

## 2014-10-09 DIAGNOSIS — I70219 Atherosclerosis of native arteries of extremities with intermittent claudication, unspecified extremity: Secondary | ICD-10-CM

## 2014-10-09 NOTE — Progress Notes (Signed)
Patient name: Chad Gomez MRN: [id number] DOB: [birth year] Sex: male     Chief Complaint  Patient presents with  . Re-evaluation    3 month f/u c/o pain L LE pain, appt moved up     HISTORY OF PRESENT ILLNESS: The patient comes in early for his three-month visit for claudication evaluation.  When I last saw him he was having trouble at approximately 50 yards and his left leg.  His leg gets tight and he has to stop working.  He has had to stop work 3 times the last 6 weeks.  His symptoms go away with rest.  He was started on cilostazol.  He initially did not tolerate 100 mg twice a day, secondary to abdominal cramping and diarrhea.  He decreased the dose to 50 twice a day which she did tolerate.  He then re-increase the dose to 100 mg and appears to be tolerating this.  Despite some benefit from the medication, he still has significant limitations with his activity level and wants to discuss additional options.  The patient has a long-standing history of hypertension. With 2 medications including an ACE inhibitor his blood pressure is in the 120s now. Historically it had been in the low 200s. The patient suffers from hypercholesterolemia which is managed with a statin. He is a 2 pack per day smoker.  Past Medical History  Diagnosis Date  . Hypertension   . Iron deficiency   . History of bladder stone   . Frequency of urination   . Nocturia   . Seasonal allergies   . BPH (benign prostatic hypertrophy)   . Right ureteral stone   . History of melanoma excision     LEFT EAR  . Bladder stone   . History of kidney stones   . History of colon polyps     08/2013   pre-canerous  . Complication of anesthesia     urinary retention  . Frequency of urination   . Weak urinary stream   . Wears glasses   . Full dentures   . Chronic kidney disease     Past Surgical History  Procedure Laterality Date  . Cystolithalopaxy of bladder stone/ transrectal ultrasound prostate bx   08-19-2005  . Cystoscopy with litholapaxy Right 12/02/2012    Procedure: CYSTOSCOPY WITH stone extraction;  Surgeon: Malka So, MD;  Location: [facility];  Service: Urology;  Laterality: Right;  . Ureteroscopy Right 12/02/2012    Procedure: RIGHT URETEROSCOPY STONE EXTRACTION WITH  STENT PLACEMENT;  Surgeon: Malka So, MD;  Location: [facility];  Service: Urology;  Laterality: Right;  . Holmium laser application Right 5/59/7416    Procedure: HOLMIUM LASER APPLICATION;  Surgeon: Malka So, MD;  Location: [facility];  Service: Urology;  Laterality: Right;  . Cystoscopy w/ retrogrades Right 12/02/2012    Procedure: CYSTOSCOPY WITH RETROGRADE PYELOGRAM;  Surgeon: Malka So, MD;  Location: [facility];  Service: Urology;  Laterality: Right;  . Nephrolithotomy Left 01/27/2013    Procedure: NEPHROLITHOTOMY PERCUTANEOUS FIRST LOOK;  Surgeon: Malka So, MD;  Location: [facility];  Service: Urology;  Laterality: Left;  Marland Kitchen Mohs surgery  1996    left ear  . Multiple tooth extractions  1994  . Mandible fracture surgery  1995  . Inguinal hernia repair Right 1954  . Colonoscopy w/ polypectomy  09-13-2013  . Cystoscopy with litholapaxy Right 10/13/2013    Procedure: CYSTOSCOPY WITH  LITHOLAPAXY, removal of urethral stone with basket;  Surgeon: Irine Seal, MD;  Location: [facility];  Service: Urology;  Laterality: Right;  . Cystoscopy w/ retrogrades Bilateral 10/13/2013    Procedure: CYSTOSCOPY WITH RETROGRADE PYELOGRAM;  Surgeon: Irine Seal, MD;  Location: [facility];  Service: Urology;  Laterality: Bilateral;  . Lithotripsy  2007 & 02/2013  . Cystoscopy with retrograde pyelogram, ureteroscopy and stent placement Left 03/21/2014    Procedure: CYSTOSCOPY WITH RETROGRADE PYELOGRAM, AND LEFT STENT PLACEMENT;  Surgeon: Alexis Frock, MD;  Location: [facility];  Service: Urology;  Laterality: Left;  . Holmium laser  application Left 63/87/5643    Procedure: HOLMIUM LASER APPLICATION;  Surgeon: Malka So, MD;  Location: [facility];  Service: Urology;  Laterality: Left;  . Cystoscopy w/ ureteral stent placement Left 04/11/2014    Procedure: CYSTOSCOPY WITH STENT REPLACEMENT;  Surgeon: Malka So, MD;  Location: [facility];  Service: Urology;  Laterality: Left;  . Cystoscopy with ureteroscopy Left 04/11/2014    Procedure: CYSTOSCOPY WITH URETEROSCOPY/ STONE EXTRACTION;  Surgeon: Malka So, MD;  Location: [facility];  Service: Urology;  Laterality: Left;    History   Social History  . Marital Status: Married    Spouse Name: N/A  . Number of Children: N/A  . Years of Education: N/A   Occupational History  . Not on file.   Social History Main Topics  . Smoking status: Current Every Day Smoker -- 2.00 packs/day for 50 years    Types: Cigarettes  . Smokeless tobacco: Never Used     Comment: PT HAS CUT DOWN TO 1PPD FROM 2 PPD LAST YR (2014)  . Alcohol Use: No  . Drug Use: No  . Sexual Activity: Not on file   Other Topics Concern  . Not on file   Social History Narrative   Patient is married with 2 children.   Patient is right handed.   Patient has some college education.   Patient does not drink caffeine.    Family History  Problem Relation Age of Onset  . Colon cancer Neg Hx   . Dementia Mother   . Hypertension Mother   . Dementia Father   . Hypertension Sister     Allergies as of 10/09/2014 - Review Complete 10/09/2014  Allergen Reaction Noted  . Amoxicillin Other (See Comments) 11/26/2012  . Ibuprofen Swelling 11/26/2012  . Naproxen Swelling 11/26/2012  . Tape Other (See Comments) 09/02/2013    Current Outpatient Prescriptions on File Prior to Visit  Medication Sig Dispense Refill  . amLODipine (NORVASC) 10 MG tablet Take 10 mg by mouth every morning.     Marland Kitchen aspirin 325 MG tablet Take 325 mg by mouth daily.    .  Cholecalciferol (VITAMIN D3) 400 UNITS CAPS Take by mouth.    . cilostazol (PLETAL) 100 MG tablet Take 1 tablet (100 mg total) by mouth 2 (two) times daily before a meal. 60 tablet 11  . Cyanocobalamin (VITAMIN B-12 PO) Take 1 capsule by mouth daily.    . Ferrous Sulfate (IRON) 325 (65 FE) MG TABS Take 1 tablet by mouth every morning.    . fluticasone (FLONASE) 50 MCG/ACT nasal spray Place 2 sprays into the nose 2 (two) times daily.    . indapamide (LOZOL) 2.5 MG tablet Take 2.5 mg by mouth daily.    Marland Kitchen lisinopril (PRINIVIL,ZESTRIL) 20 MG tablet Take 20 mg by mouth daily.    Marland Kitchen  loratadine (CLARITIN) 10 MG tablet Take 10 mg by mouth daily.    . niacin 500 MG tablet Take 500 mg by mouth daily with breakfast.    . POTASSIUM CITRATE PO Take 180 mg by mouth 2 (two) times daily.     . pravastatin (PRAVACHOL) 20 MG tablet Take 20 mg by mouth daily.    Marland Kitchen oxyCODONE-acetaminophen (ROXICET) 5-325 MG per tablet Take 1 tablet by mouth every 4 (four) hours as needed for severe pain. (Patient not taking: Reported on 08/14/2014) 40 tablet 0  . phenazopyridine (PYRIDIUM) 200 MG tablet Take 1 tablet (200 mg total) by mouth 3 (three) times daily as needed for pain. (Patient not taking: Reported on 08/14/2014) 30 tablet 1  . sulfamethoxazole-trimethoprim (BACTRIM DS) 800-160 MG per tablet Take 1 tablet by mouth 2 (two) times daily. (Patient not taking: Reported on 08/14/2014) 12 tablet 0   No current facility-administered medications on file prior to visit.     REVIEW OF SYSTEMS: See history of present illness, otherwise no changes from visit 6 weeks ago.  PHYSICAL EXAMINATION:   Vital signs are  Filed Vitals:   10/09/14 1129  BP: 114/75  Pulse: 100  Height: 5\' 10"  (1.778 m)  Weight: 185 lb 6.4 oz (84.097 kg)  SpO2: 96%   Body mass index is 26.6 kg/(m^2). General: The patient appears their stated age. HEENT:  No gross abnormalities Pulmonary:  Non labored breathing Musculoskeletal: There are no major  deformities. Neurologic: No focal weakness or paresthesias are detected, Skin: There are no ulcer or rashes noted. Psychiatric: The patient has normal affect. Cardiovascular: There is a regular rate and rhythm without significant murmur appreciated.  Palpable right pedal pulse, nonpalpable left   Diagnostic Studies I reviewed his old ultrasound, confirming a small infrarenal aneurysm and left iliac occlusion  Assessment: #1: Left leg claudication #2: Abdominal aortic aneurysm (small) Plan: I feel that the patient has not had a sufficient response to Pletal.  He is currently receiving maximal medical therapy.  He would like to proceed with more aggressive treatment.  The next step is angiography.  I discussed cannulation of the right groin with an abdominal aortogram and bilateral runoff, with possible recanalization of his left external iliac artery occlusion.  I discussed the risks of the procedure which include but are not limited to the risk of bleeding and distal embolization which could potentially require emergent surgery.  If he is not a percutaneous candidate, decision will need to be made regarding femoral-femoral bypass graft versus an aortobifemoral bypass graft.  The size of his aneurysm will help determine this.  He may need CT angiography to better define this if he is not a percutaneous candidate.  His procedure has been scheduled for May 11  V. Leia Alf, M.D. [facility] [place] Office: [phone] Pager:  [phone]

## 2014-11-01 ENCOUNTER — Ambulatory Visit (HOSPITAL_COMMUNITY)
Admission: RE | Admit: 2014-11-01 | Discharge: 2014-11-01 | Disposition: A | Discharge status: Home / Self Care | Payer: BLUE CROSS/BLUE SHIELD | Source: Ambulatory Visit | Attending: Surgery | Admitting: Surgery

## 2014-11-01 ENCOUNTER — Telehealth: Payer: Self-pay | Admitting: Surgery

## 2014-11-01 ENCOUNTER — Other Ambulatory Visit: Payer: Self-pay

## 2014-11-01 ENCOUNTER — Encounter (HOSPITAL_COMMUNITY)
Admission: RE | Disposition: A | Discharge status: Home / Self Care | Payer: BLUE CROSS/BLUE SHIELD | Source: Ambulatory Visit | Attending: Surgery

## 2014-11-01 ENCOUNTER — Ambulatory Visit (HOSPITAL_COMMUNITY): Admission: RE | Admit: 2014-11-01 | Payer: BLUE CROSS/BLUE SHIELD | Source: Ambulatory Visit | Admitting: Surgery

## 2014-11-01 ENCOUNTER — Encounter (HOSPITAL_COMMUNITY): Admission: RE | Payer: Self-pay | Source: Ambulatory Visit

## 2014-11-01 DIAGNOSIS — R9431 Abnormal electrocardiogram [ECG] [EKG]: Secondary | ICD-10-CM | POA: Diagnosis not present

## 2014-11-01 DIAGNOSIS — I739 Peripheral vascular disease, unspecified: Secondary | ICD-10-CM | POA: Diagnosis present

## 2014-11-01 DIAGNOSIS — Z7902 Long term (current) use of antithrombotics/antiplatelets: Secondary | ICD-10-CM | POA: Diagnosis not present

## 2014-11-01 DIAGNOSIS — J302 Other seasonal allergic rhinitis: Secondary | ICD-10-CM | POA: Insufficient documentation

## 2014-11-01 DIAGNOSIS — I745 Embolism and thrombosis of iliac artery: Secondary | ICD-10-CM | POA: Insufficient documentation

## 2014-11-01 DIAGNOSIS — N400 Enlarged prostate without lower urinary tract symptoms: Secondary | ICD-10-CM | POA: Diagnosis not present

## 2014-11-01 DIAGNOSIS — F17210 Nicotine dependence, cigarettes, uncomplicated: Secondary | ICD-10-CM | POA: Insufficient documentation

## 2014-11-01 DIAGNOSIS — I714 Abdominal aortic aneurysm, without rupture, unspecified: Secondary | ICD-10-CM

## 2014-11-01 DIAGNOSIS — I70213 Atherosclerosis of native arteries of extremities with intermittent claudication, bilateral legs: Secondary | ICD-10-CM | POA: Diagnosis not present

## 2014-11-01 DIAGNOSIS — I129 Hypertensive chronic kidney disease with stage 1 through stage 4 chronic kidney disease, or unspecified chronic kidney disease: Secondary | ICD-10-CM | POA: Insufficient documentation

## 2014-11-01 DIAGNOSIS — N189 Chronic kidney disease, unspecified: Secondary | ICD-10-CM | POA: Diagnosis not present

## 2014-11-01 DIAGNOSIS — E611 Iron deficiency: Secondary | ICD-10-CM | POA: Insufficient documentation

## 2014-11-01 DIAGNOSIS — Z7982 Long term (current) use of aspirin: Secondary | ICD-10-CM | POA: Diagnosis not present

## 2014-11-01 DIAGNOSIS — Z85820 Personal history of malignant melanoma of skin: Secondary | ICD-10-CM | POA: Insufficient documentation

## 2014-11-01 DIAGNOSIS — I700 Atherosclerosis of aorta: Secondary | ICD-10-CM | POA: Diagnosis not present

## 2014-11-01 DIAGNOSIS — Z01818 Encounter for other preprocedural examination: Secondary | ICD-10-CM

## 2014-11-01 DIAGNOSIS — I70212 Atherosclerosis of native arteries of extremities with intermittent claudication, left leg: Secondary | ICD-10-CM | POA: Diagnosis not present

## 2014-11-01 DIAGNOSIS — Z79899 Other long term (current) drug therapy: Secondary | ICD-10-CM | POA: Insufficient documentation

## 2014-11-01 DIAGNOSIS — E780 Pure hypercholesterolemia: Secondary | ICD-10-CM | POA: Insufficient documentation

## 2014-11-01 HISTORY — PX: PERIPHERAL VASCULAR CATHETERIZATION: SHX172C

## 2014-11-01 HISTORY — PX: ABDOMINAL AORTAGRAM: SHX5454

## 2014-11-01 LAB — POCT I-STAT, CHEM 8
BUN: 22 mg/dL — ABNORMAL HIGH (ref 6–20)
CREATININE: 1.2 mg/dL (ref 0.61–1.24)
Calcium, Ion: 1.3 mmol/L (ref 1.13–1.30)
Chloride: 103 mmol/L (ref 101–111)
Glucose, Bld: 103 mg/dL — ABNORMAL HIGH (ref 70–99)
HEMATOCRIT: 48 % (ref 39.0–52.0)
Hemoglobin: 16.3 g/dL (ref 13.0–17.0)
POTASSIUM: 4 mmol/L (ref 3.5–5.1)
Sodium: 138 mmol/L (ref 135–145)
TCO2: 22 mmol/L (ref 0–100)

## 2014-11-01 SURGERY — LOWER EXTREMITY ANGIOGRAPHY

## 2014-11-01 SURGERY — ANGIOGRAM, LOWER EXTREMITY
Anesthesia: LOCAL

## 2014-11-01 MED ORDER — PHENOL 1.4 % MT LIQD: 1.0000 | OROMUCOSAL | Status: DC | PRN

## 2014-11-01 MED ORDER — OXYCODONE HCL 5 MG PO TABS: 5.0000 mg | ORAL_TABLET | ORAL | Status: DC | PRN

## 2014-11-01 MED ORDER — IODIXANOL 320 MG/ML IV SOLN
INTRAVENOUS | Status: DC | PRN
  Administered 2014-11-01: 112 mL via INTRA_ARTERIAL

## 2014-11-01 MED ORDER — SODIUM CHLORIDE 0.9 % IV SOLN: 1.0000 mL/kg/h | INTRAVENOUS | Status: DC

## 2014-11-01 MED ORDER — MIDAZOLAM HCL 2 MG/2ML IJ SOLN
INTRAMUSCULAR | Status: AC
  Filled 2014-11-01: qty 2

## 2014-11-01 MED ORDER — SODIUM CHLORIDE 0.9 % IV SOLN
INTRAVENOUS | Status: DC
  Administered 2014-11-01: 07:00:00 via INTRAVENOUS

## 2014-11-01 MED ORDER — GUAIFENESIN-DM 100-10 MG/5ML PO SYRP: 15.0000 mL | ORAL_SOLUTION | ORAL | Status: DC | PRN

## 2014-11-01 MED ORDER — ACETAMINOPHEN 325 MG PO TABS: 325.0000 mg | ORAL_TABLET | ORAL | Status: DC | PRN

## 2014-11-01 MED ORDER — METOPROLOL TARTRATE 1 MG/ML IV SOLN: 2.0000 mg | INTRAVENOUS | Status: DC | PRN

## 2014-11-01 MED ORDER — DOCUSATE SODIUM 100 MG PO CAPS: 100.0000 mg | ORAL_CAPSULE | Freq: Every day | ORAL | Status: DC

## 2014-11-01 MED ORDER — ONDANSETRON HCL 4 MG/2ML IJ SOLN: 4.0000 mg | Freq: Four times a day (QID) | INTRAMUSCULAR | Status: DC | PRN

## 2014-11-01 MED ORDER — ACETAMINOPHEN 325 MG RE SUPP: 325.0000 mg | RECTAL | Status: DC | PRN

## 2014-11-01 MED ORDER — MIDAZOLAM HCL 2 MG/2ML IJ SOLN
INTRAMUSCULAR | Status: DC | PRN
  Administered 2014-11-01: 1 mg via INTRAVENOUS

## 2014-11-01 MED ORDER — ALUM & MAG HYDROXIDE-SIMETH 200-200-20 MG/5ML PO SUSP: 15.0000 mL | ORAL | Status: DC | PRN

## 2014-11-01 MED ORDER — HYDRALAZINE HCL 20 MG/ML IJ SOLN: 5.0000 mg | INTRAMUSCULAR | Status: DC | PRN

## 2014-11-01 MED ORDER — FENTANYL CITRATE (PF) 100 MCG/2ML IJ SOLN
INTRAMUSCULAR | Status: DC | PRN
  Administered 2014-11-01: 25 ug via INTRAVENOUS

## 2014-11-01 MED ORDER — FENTANYL CITRATE (PF) 100 MCG/2ML IJ SOLN
INTRAMUSCULAR | Status: AC
  Filled 2014-11-01: qty 2

## 2014-11-01 MED ORDER — LIDOCAINE HCL (PF) 1 % IJ SOLN
INTRAMUSCULAR | Status: AC
  Filled 2014-11-01: qty 30

## 2014-11-01 MED ORDER — LABETALOL HCL 5 MG/ML IV SOLN: 10.0000 mg | INTRAVENOUS | Status: DC | PRN

## 2014-11-01 MED ORDER — HEPARIN (PORCINE) IN NACL 2-0.9 UNIT/ML-% IJ SOLN
INTRAMUSCULAR | Status: AC
  Filled 2014-11-01: qty 1000

## 2014-11-01 SURGICAL SUPPLY — 10 items
CATH OMNI FLUSH 5F 65CM (CATHETERS) ×2 IMPLANT
COVER PRB 48X5XTLSCP FOLD TPE (BAG) ×1 IMPLANT
COVER PROBE 5X48 (BAG) ×4
KIT MICROINTRODUCER STIFF 5F (SHEATH) ×2 IMPLANT
KIT PV (KITS) ×4 IMPLANT
SHEATH PINNACLE 5F 10CM (SHEATH) ×3 IMPLANT
SYR MEDRAD MARK V 150ML (SYRINGE) ×2 IMPLANT
TRANSDUCER W/STOPCOCK (MISCELLANEOUS) ×4 IMPLANT
TRAY PV CATH (CUSTOM PROCEDURE TRAY) ×4 IMPLANT
WIRE BENTSON .035X145CM (WIRE) ×3 IMPLANT

## 2014-11-01 NOTE — Interval H&P Note (Signed)
History and Physical Interval Note:  11/01/2014 8:42 AM  Chad Gomez  has presented today for surgery, with the diagnosis of claudication  The various methods of treatment have been discussed with the patient and family. After consideration of risks, benefits and other options for treatment, the patient has consented to  Procedure(s): Lower Extremity Angiography (N/A) as a surgical intervention .  The patient's history has been reviewed, patient examined, no change in status, stable for surgery.  I have reviewed the patient's chart and labs.  Questions were answered to the patient's satisfaction.     Annamarie Major

## 2014-11-01 NOTE — H&P (View-Only) (Signed)
   Patient name: Chad Gomez MRN: [id number] DOB: 01/18/1946 Sex: male     Chief Complaint  Patient presents with  . Re-evaluation    3 month f/u c/o pain L LE pain, appt moved up     HISTORY OF PRESENT ILLNESS: The patient comes in early for his three-month visit for claudication evaluation.  When I last saw him he was having trouble at approximately 50 yards and his left leg.  His leg gets tight and he has to stop working.  He has had to stop work 3 times the last 6 weeks.  His symptoms go away with rest.  He was started on cilostazol.  He initially did not tolerate 100 mg twice a day, secondary to abdominal cramping and diarrhea.  He decreased the dose to 50 twice a day which she did tolerate.  He then re-increase the dose to 100 mg and appears to be tolerating this.  Despite some benefit from the medication, he still has significant limitations with his activity level and wants to discuss additional options.  The patient has a long-standing history of hypertension. With 2 medications including an ACE inhibitor his blood pressure is in the 120s now. Historically it had been in the low 200s. The patient suffers from hypercholesterolemia which is managed with a statin. He is a 2 pack per day smoker.  Past Medical History  Diagnosis Date  . Hypertension   . Iron deficiency   . History of bladder stone   . Frequency of urination   . Nocturia   . Seasonal allergies   . BPH (benign prostatic hypertrophy)   . Right ureteral stone   . History of melanoma excision     LEFT EAR  . Bladder stone   . History of kidney stones   . History of colon polyps     08/2013   pre-canerous  . Complication of anesthesia     urinary retention  . Frequency of urination   . Weak urinary stream   . Wears glasses   . Full dentures   . Chronic kidney disease     Past Surgical History  Procedure Laterality Date  . Cystolithalopaxy of bladder stone/ transrectal ultrasound prostate bx   08-19-2005  . Cystoscopy with litholapaxy Right 12/02/2012    Procedure: CYSTOSCOPY WITH stone extraction;  Surgeon: John J Wrenn, MD;  Location: [facility];  Service: Urology;  Laterality: Right;  . Ureteroscopy Right 12/02/2012    Procedure: RIGHT URETEROSCOPY STONE EXTRACTION WITH  STENT PLACEMENT;  Surgeon: John J Wrenn, MD;  Location: [facility];  Service: Urology;  Laterality: Right;  . Holmium laser application Right 12/02/2012    Procedure: HOLMIUM LASER APPLICATION;  Surgeon: John J Wrenn, MD;  Location: [facility];  Service: Urology;  Laterality: Right;  . Cystoscopy w/ retrogrades Right 12/02/2012    Procedure: CYSTOSCOPY WITH RETROGRADE PYELOGRAM;  Surgeon: John J Wrenn, MD;  Location: [facility];  Service: Urology;  Laterality: Right;  . Nephrolithotomy Left 01/27/2013    Procedure: NEPHROLITHOTOMY PERCUTANEOUS FIRST LOOK;  Surgeon: John J Wrenn, MD;  Location: [facility];  Service: Urology;  Laterality: Left;  . Mohs surgery  1996    left ear  . Multiple tooth extractions  1994  . Mandible fracture surgery  1995  . Inguinal hernia repair Right 1954  . Colonoscopy w/ polypectomy  09-13-2013  . Cystoscopy with litholapaxy Right 10/13/2013    Procedure: CYSTOSCOPY WITH   LITHOLAPAXY, removal of urethral stone with basket;  Surgeon: John Wrenn, MD;  Location: [facility];  Service: Urology;  Laterality: Right;  . Cystoscopy w/ retrogrades Bilateral 10/13/2013    Procedure: CYSTOSCOPY WITH RETROGRADE PYELOGRAM;  Surgeon: John Wrenn, MD;  Location: [facility];  Service: Urology;  Laterality: Bilateral;  . Lithotripsy  2007 & 02/2013  . Cystoscopy with retrograde pyelogram, ureteroscopy and stent placement Left 03/21/2014    Procedure: CYSTOSCOPY WITH RETROGRADE PYELOGRAM, AND LEFT STENT PLACEMENT;  Surgeon: Theodore Manny, MD;  Location: [facility];  Service: Urology;  Laterality: Left;  . Holmium laser  application Left 04/11/2014    Procedure: HOLMIUM LASER APPLICATION;  Surgeon: John J Wrenn, MD;  Location: [facility];  Service: Urology;  Laterality: Left;  . Cystoscopy w/ ureteral stent placement Left 04/11/2014    Procedure: CYSTOSCOPY WITH STENT REPLACEMENT;  Surgeon: John J Wrenn, MD;  Location: [facility];  Service: Urology;  Laterality: Left;  . Cystoscopy with ureteroscopy Left 04/11/2014    Procedure: CYSTOSCOPY WITH URETEROSCOPY/ STONE EXTRACTION;  Surgeon: John J Wrenn, MD;  Location: [facility];  Service: Urology;  Laterality: Left;    History   Social History  . Marital Status: Married    Spouse Name: N/A  . Number of Children: N/A  . Years of Education: N/A   Occupational History  . Not on file.   Social History Main Topics  . Smoking status: Current Every Day Smoker -- 2.00 packs/day for 50 years    Types: Cigarettes  . Smokeless tobacco: Never Used     Comment: PT HAS CUT DOWN TO 1PPD FROM 2 PPD LAST YR (2014)  . Alcohol Use: No  . Drug Use: No  . Sexual Activity: Not on file   Other Topics Concern  . Not on file   Social History Narrative   Patient is married with 2 children.   Patient is right handed.   Patient has some college education.   Patient does not drink caffeine.    Family History  Problem Relation Age of Onset  . Colon cancer Neg Hx   . Dementia Mother   . Hypertension Mother   . Dementia Father   . Hypertension Sister     Allergies as of 10/09/2014 - Review Complete 10/09/2014  Allergen Reaction Noted  . Amoxicillin Other (See Comments) 11/26/2012  . Ibuprofen Swelling 11/26/2012  . Naproxen Swelling 11/26/2012  . Tape Other (See Comments) 09/02/2013    Current Outpatient Prescriptions on File Prior to Visit  Medication Sig Dispense Refill  . amLODipine (NORVASC) 10 MG tablet Take 10 mg by mouth every morning.     . aspirin 325 MG tablet Take 325 mg by mouth daily.    .  Cholecalciferol (VITAMIN D3) 400 UNITS CAPS Take by mouth.    . cilostazol (PLETAL) 100 MG tablet Take 1 tablet (100 mg total) by mouth 2 (two) times daily before a meal. 60 tablet 11  . Cyanocobalamin (VITAMIN B-12 PO) Take 1 capsule by mouth daily.    . Ferrous Sulfate (IRON) 325 (65 FE) MG TABS Take 1 tablet by mouth every morning.    . fluticasone (FLONASE) 50 MCG/ACT nasal spray Place 2 sprays into the nose 2 (two) times daily.    . indapamide (LOZOL) 2.5 MG tablet Take 2.5 mg by mouth daily.    . lisinopril (PRINIVIL,ZESTRIL) 20 MG tablet Take 20 mg by mouth daily.    .   loratadine (CLARITIN) 10 MG tablet Take 10 mg by mouth daily.    . niacin 500 MG tablet Take 500 mg by mouth daily with breakfast.    . POTASSIUM CITRATE PO Take 180 mg by mouth 2 (two) times daily.     . pravastatin (PRAVACHOL) 20 MG tablet Take 20 mg by mouth daily.    . oxyCODONE-acetaminophen (ROXICET) 5-325 MG per tablet Take 1 tablet by mouth every 4 (four) hours as needed for severe pain. (Patient not taking: Reported on 08/14/2014) 40 tablet 0  . phenazopyridine (PYRIDIUM) 200 MG tablet Take 1 tablet (200 mg total) by mouth 3 (three) times daily as needed for pain. (Patient not taking: Reported on 08/14/2014) 30 tablet 1  . sulfamethoxazole-trimethoprim (BACTRIM DS) 800-160 MG per tablet Take 1 tablet by mouth 2 (two) times daily. (Patient not taking: Reported on 08/14/2014) 12 tablet 0   No current facility-administered medications on file prior to visit.     REVIEW OF SYSTEMS: See history of present illness, otherwise no changes from visit 6 weeks ago.  PHYSICAL EXAMINATION:   Vital signs are  Filed Vitals:   10/09/14 1129  BP: 114/75  Pulse: 100  Height: 5' 10" (1.778 m)  Weight: 185 lb 6.4 oz (84.097 kg)  SpO2: 96%   Body mass index is 26.6 kg/(m^2). General: The patient appears their stated age. HEENT:  No gross abnormalities Pulmonary:  Non labored breathing Musculoskeletal: There are no major  deformities. Neurologic: No focal weakness or paresthesias are detected, Skin: There are no ulcer or rashes noted. Psychiatric: The patient has normal affect. Cardiovascular: There is a regular rate and rhythm without significant murmur appreciated.  Palpable right pedal pulse, nonpalpable left   Diagnostic Studies I reviewed his old ultrasound, confirming a small infrarenal aneurysm and left iliac occlusion  Assessment: #1: Left leg claudication #2: Abdominal aortic aneurysm (small) Plan: I feel that the patient has not had a sufficient response to Pletal.  He is currently receiving maximal medical therapy.  He would like to proceed with more aggressive treatment.  The next step is angiography.  I discussed cannulation of the right groin with an abdominal aortogram and bilateral runoff, with possible recanalization of his left external iliac artery occlusion.  I discussed the risks of the procedure which include but are not limited to the risk of bleeding and distal embolization which could potentially require emergent surgery.  If he is not a percutaneous candidate, decision will need to be made regarding femoral-femoral bypass graft versus an aortobifemoral bypass graft.  The size of his aneurysm will help determine this.  He may need CT angiography to better define this if he is not a percutaneous candidate.  His procedure has been scheduled for May 11  V. Wells Brabham IV, M.D. [facility] [place] Office: [phone] Pager:  [phone]   

## 2014-11-01 NOTE — Progress Notes (Signed)
Site area: rt groin   Site Prior to Removal:  Level   0 Pressure Applied For:  20 minutes Manual:   yes Patient Status During Pull:  stable Post Pull Site:  Level  0 Post Pull Instructions Given:  yes Post Pull Pulses Present: yes Dressing Applied:  tegaderm Bedrest begins @  1753 Comments:  none

## 2014-11-01 NOTE — Op Note (Addendum)
    Patient name: Chad Gomez MRN: [id number] DOB: Nov 09, 1945 Sex: male  11/01/2014 Pre-operative Diagnosis: Bi;atreal claudication Post-operative diagnosis:  Same Surgeon:  Annamarie Major Procedure Performed:  1.  U/s guided access right femoral artery  2.  Abdominal aortogram  3.  Bilateral runoff    Indications:  The patient suffers from lifestyle limiting claudication which has been unresponsive to medical therapy.  He also has a small aortic aneurysm.  He is here today for further evaluation.  Ultrasound suggested occluded left external iliac artery.  Procedure:  The patient was identified in the holding area and taken to room 8.  The patient was then placed supine on the table and prepped and draped in the usual sterile fashion.  A time out was called.  Ultrasound was used to evaluate the right common femoral artery.  It was patent .  A digital ultrasound image was acquired.  A micropuncture needle was used to access the right common femoral artery under ultrasound guidance.  An 018 wire was advanced without resistance and a micropuncture sheath was placed.  The 018 wire was removed and a benson wire was placed.  The micropuncture sheath was exchanged for a 5 french sheath.  An omniflush catheter was advanced over the wire to the level of L-1.  An abdominal angiogram was obtained.  Next, the catheter was pulled down to the aortic bifurcation and an abdominal pelvic image was acquired.  Bilateral lower extremity runoff was performed.    Findings:   Aortogram:    No significant suprarenal aortic stenosis is identified.  No significant renal artery stenosis is identified.  The infrarenal abdominal aorta is widely patent.  The infrarenal abdominal aorta has an area of luminal narrowing as well as an area of aneurysmal degeneration.  The right common and external iliac arteries are widely patent.  There is a 80% stenosis in the left common iliac artery and the left external iliac artery is  occluded  Right Lower Extremity:  Right common femoral profunda femoral artery widely patent.  The superficial femoral and popliteal artery are widely patent.  There is an early takeoff and separate origin of the posterior tibial artery which is the dominant runoff across the ankle.  The patient does appear to have all 3 tibial vessels at least down to the ankle.  Left Lower Extremity:  There is reconstitution of the distal left external iliac artery with widely patent superficial femoral and profunda femoral arteries.  Popliteal artery is patent throughout it's course.  The dominant runoff vessel is the posterior tibial artery.  All 3 tibial vessels appear patent down to the midcalf however for visualization is somewhat limited secondary to proximal disease and contrast washout  Intervention:  none  Impression:  #1  high-grade stenosis at the origin of the left common iliac artery as well as occlusion of the left external iliac artery with reconstitution of the level of femoral head  #2  luminal irregularity in the distal abdominal aorta with possible stenosis of uncertain hemodynamic significance.    #3  aneurysmal degeneration of the infrarenal abdominal aorta   #4  bilateral outflow and runoff vessels appear intact with the dominant runoff being the posterior tibial bilaterally   #5  the patient will be brought back for consideration of an aortobifemoral bypass graft    V. Annamarie Major, M.D. [facility] Office: [phone] Pager:  [phone]

## 2014-11-01 NOTE — Telephone Encounter (Signed)
Spoke with patient to schedule his appts. Seeing [facility] on 06/03 and [facility] 06/13. I had offered him an appt with [facility] on 06/06, but pt declined, dpm

## 2014-11-01 NOTE — Discharge Instructions (Signed)

## 2014-11-01 NOTE — Telephone Encounter (Signed)
-----   Message from Denman George, RN sent at 11/01/2014  9:16 AM EDT ----- Regarding: Zigmund Daniel log; also needs Cardiol. appt for CC and then f/u in 2-3 wks w/ [facility]   ----- Message -----    From: Serafina Mitchell, MD    Sent: 11/01/2014   9:08 AM      To: [facility] Charge Pool  11/01/2014:  Surgeon:  Annamarie Major Procedure Performed:  1.  U/s guided access right femoral artery  2.  Abdominal aortogram  3.  Bilateral runoff   Please schedule the patient for follow-up in the next 2-3 weeks.  Prior to his visit he will need cardiology clearance for an aortobifemoral bypass graft

## 2014-11-02 ENCOUNTER — Encounter (HOSPITAL_COMMUNITY): Payer: Self-pay | Admitting: Surgery

## 2014-11-02 MED FILL — Lidocaine HCl Local Preservative Free (PF) Inj 1%: INTRAMUSCULAR | Qty: 30 | Status: AC

## 2014-11-02 MED FILL — Heparin Sodium (Porcine) 2 Unit/ML in Sodium Chloride 0.9%: INTRAMUSCULAR | Qty: 1000 | Status: AC

## 2014-11-06 ENCOUNTER — Ambulatory Visit: Payer: BLUE CROSS/BLUE SHIELD | Admitting: Surgery

## 2014-11-13 ENCOUNTER — Other Ambulatory Visit: Payer: Self-pay

## 2014-11-13 ENCOUNTER — Telehealth: Payer: Self-pay

## 2014-11-13 NOTE — Telephone Encounter (Signed)
Phone call from pt.  Reported initial intolerance to Cilostazol due to stomach cramping and diarrhea.  Reported that he  slowly increased his Cilostazol to '100mg'$  BID, and tolerated it,  so then went up to Cilostazol 100 mg TID.  Was advised per his pharmacist to call the office and ask if this dose is okay with Dr. Trula Slade.  Discussed with Dr. Trula Slade.  Advised pt. To only take Cilostazol 100 mg BID.  Called pt. Back.  Advised of recommendation to only take Cilostazol BID.  Pt. Admitted that on the TID dose, he had 2 episodes of feeling faint, while driving.  Strongly advised pt. to decrease his dose frequency, and to avoid driving while taking the Cilostazol, until his symptoms resolve.  Verb. Understanding of the dose change and of the caution to avoid driving while symptomatic.  Next f/u appt. Is 12/04/14.

## 2014-11-24 ENCOUNTER — Ambulatory Visit (INDEPENDENT_AMBULATORY_CARE_PROVIDER_SITE_OTHER): Payer: BLUE CROSS/BLUE SHIELD | Admitting: Cardiology

## 2014-11-24 ENCOUNTER — Encounter: Payer: Self-pay | Admitting: Cardiology

## 2014-11-24 VITALS — BP 126/74 | HR 102 | Ht 70.0 in | Wt 184.3 lb

## 2014-11-24 DIAGNOSIS — R072 Precordial pain: Secondary | ICD-10-CM | POA: Diagnosis not present

## 2014-11-24 DIAGNOSIS — Z01810 Encounter for preprocedural cardiovascular examination: Secondary | ICD-10-CM | POA: Diagnosis not present

## 2014-11-24 MED ORDER — VARENICLINE TARTRATE 0.5 MG PO TABS: 0.5000 mg | ORAL_TABLET | Freq: Two times a day (BID) | ORAL | Status: DC

## 2014-11-24 NOTE — Progress Notes (Signed)
Cardiology Office Note   Date:  11/24/2014   ID:  Chad Gomez, Chad Gomez 1946/04/06, MRN [id number]  PCP:  Horatio Pel, MD  Cardiologist:   Minus Breeding, MD   Chief Complaint  Patient presents with  . Pre-op Exam      History of Present Illness: Chad Gomez is a 69 y.o. male who presents for preoperative evaluation prior to having possible lower vascular surgery. 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 He is being considered for aortobifem bypass graft. He is sent for this evaluation.  He has no prior cardiac history. He is very limited in his activities because of his claudication. He'll start getting leg pain and moderate distance of 70 yards on level ground. He does not have any chest pressure, neck or arm discomfort. He doesn't notice any palpitations, presyncope or syncope. He does have any shortness of breath, PND or orthopnea. He has no weight gain or edema.   Past Medical History  Diagnosis Date  . Hypertension   . Iron deficiency   . History of bladder stone   . Seasonal allergies   . BPH (benign prostatic hypertrophy)   . Right ureteral stone   . History of melanoma excision     LEFT EAR  . History of colon polyps     08/2013   pre-canerous  . Complication of anesthesia     urinary retention  . Chronic kidney disease     Past Surgical History  Procedure Laterality Date  . Cystolithalopaxy of bladder stone/ transrectal ultrasound prostate bx  08-19-2005  . Cystoscopy with litholapaxy Right 12/02/2012    Procedure: CYSTOSCOPY WITH stone extraction;  Surgeon: Malka So, MD;  Location[facility];  Service: Urology;  Laterality: Right;  . Ureteroscopy Right 12/02/2012    Procedure: RIGHT URETEROSCOPY STONE EXTRACTION WITH  STENT PLACEMENT;  Surgeon: Malka So, MD;  Location: [facility];  Service: Urology;  Laterality: Right;  . Holmium laser application Right 1/44/3154   Procedure: HOLMIUM LASER APPLICATION;  Surgeon: Malka So, MD;  Location: [facility];  Service: Urology;  Laterality: Right;  . Cystoscopy w/ retrogrades Right 12/02/2012    Procedure: CYSTOSCOPY WITH RETROGRADE PYELOGRAM;  Surgeon: Malka So, MD;  Location: [facility];  Service: Urology;  Laterality: Right;  . Nephrolithotomy Left 01/27/2013    Procedure: NEPHROLITHOTOMY PERCUTANEOUS FIRST LOOK;  Surgeon: Malka So, MD;  Location: [facility];  Service: Urology;  Laterality: Left;  Marland Kitchen Mohs surgery  1996    left ear  . Multiple tooth extractions  1994  . Mandible fracture surgery  1995  . Inguinal hernia repair Right 1954  . Colonoscopy w/ polypectomy  09-13-2013  . Cystoscopy with litholapaxy Right 10/13/2013    Procedure: CYSTOSCOPY WITH LITHOLAPAXY, removal of urethral stone with basket;  Surgeon: Irine Seal, MD;  Location: [facility];  Service: Urology;  Laterality: Right;  . Cystoscopy w/ retrogrades Bilateral 10/13/2013    Procedure: CYSTOSCOPY WITH RETROGRADE PYELOGRAM;  Surgeon: Irine Seal, MD;  Location: [facility];  Service: Urology;  Laterality: Bilateral;  . Lithotripsy  2007 & 02/2013  . Cystoscopy with retrograde pyelogram, ureteroscopy and stent placement Left 03/21/2014    Procedure: CYSTOSCOPY WITH RETROGRADE PYELOGRAM, AND LEFT STENT PLACEMENT;  Surgeon: Alexis Frock, MD;  Location: [facility];  Service: Urology;  Laterality: Left;  . Holmium laser application Left 00/86/7619  Procedure: HOLMIUM LASER APPLICATION;  Surgeon: Malka So, MD;  Location: [facility];  Service: Urology;  Laterality: Left;  . Cystoscopy w/ ureteral stent placement Left 04/11/2014    Procedure: CYSTOSCOPY WITH STENT REPLACEMENT;  Surgeon: Malka So, MD;  Location: [facility];  Service: Urology;  Laterality: Left;  . Cystoscopy with ureteroscopy Left 04/11/2014    Procedure: CYSTOSCOPY WITH URETEROSCOPY/  STONE EXTRACTION;  Surgeon: Malka So, MD;  Location: [facility];  Service: Urology;  Laterality: Left;  . Peripheral vascular catheterization N/A 11/01/2014    Procedure: Lower Extremity Angiography;  Surgeon: Serafina Mitchell, MD;  Location: [place] [facility];  Service: Cardiovascular;  Laterality: N/A;  . Abdominal aortagram  11/01/2014    Procedure: Abdominal Aortagram;  Surgeon: Serafina Mitchell, MD;  Location: [place] [facility];  Service: Cardiovascular;;     Current Outpatient Prescriptions  Medication Sig Dispense Refill  . amLODipine (NORVASC) 10 MG tablet Take 10 mg by mouth every morning.     Marland Kitchen aspirin EC 81 MG tablet Take 81 mg by mouth daily.    . cholecalciferol (VITAMIN D) 1000 UNITS tablet Take 1,000 Units by mouth daily.    . cilostazol (PLETAL) 100 MG tablet Take 1 tablet (100 mg total) by mouth 2 (two) times daily before a meal. 60 tablet 11  . Cyanocobalamin (VITAMIN B-12 PO) Take 1 capsule by mouth daily.    . Ferrous Sulfate (IRON) 325 (65 FE) MG TABS Take 1 tablet by mouth every morning.    . fluticasone (FLONASE) 50 MCG/ACT nasal spray Place 2 sprays into the nose 2 (two) times daily.    . indapamide (LOZOL) 2.5 MG tablet Take 2.5 mg by mouth daily.    Marland Kitchen loratadine (CLARITIN) 10 MG tablet Take 10 mg by mouth daily.    . potassium citrate (UROCIT-K) 10 MEQ (1080 MG) SR tablet Take 10 mEq by mouth 3 (three) times daily with meals.    . pravastatin (PRAVACHOL) 20 MG tablet Take 20 mg by mouth daily.    . varenicline (CHANTIX) 0.5 MG tablet Take 1 tablet (0.5 mg total) by mouth 2 (two) times daily. 60 tablet 3   No current facility-administered medications for this visit.    Allergies:   Tape; Amoxicillin; Ibuprofen; and Naproxen    Social History:  The patient  reports that he has been smoking Cigarettes.  He has a 100 pack-year smoking history. He has never used smokeless tobacco. He reports that he does not drink alcohol or use illicit drugs.    Family History:  The patient's family history includes Dementia in his father and mother; Hypertension in his mother and sister; Stroke (age of onset: 41) in his mother. There is no history of Colon cancer.    ROS:  Please see the history of present illness.   Otherwise, review of systems are positive for none.   All other systems are reviewed and negative.    PHYSICAL EXAM: VS:  BP 126/74 mmHg  Pulse 102  Ht '5\' 10"'$  (1.778 m)  Wt 184 lb 4.8 oz (83.598 kg)  BMI 26.44 kg/m2 , BMI Body mass index is 26.44 kg/(m^2). GENERAL:  Well appearing HEENT:  Pupils equal round and reactive, fundi not visualized, oral mucosa unremarkable NECK:  No jugular venous distention, waveform within normal limits, carotid upstroke brisk and symmetric, no bruits, no thyromegaly LYMPHATICS:  No cervical, inguinal adenopathy LUNGS:  Clear to auscultation bilaterally BACK:  No CVA tenderness CHEST:  Unremarkable HEART:  PMI not displaced or sustained,S1 and S2 within normal limits, no S3, no S4, no clicks, no rubs, no murmurs ABD:  Flat, positive bowel sounds normal in frequency in pitch, no bruits, no rebound, no guarding, no midline pulsatile mass, no hepatomegaly, no splenomegaly EXT:  2 plus pulses upper, absent DP/PT bilateral lower, no edema, no cyanosis no clubbing SKIN:  No rashes no nodules NEURO:  Cranial nerves II through XII grossly intact, motor grossly intact throughout PSYCH:  Cognitively intact, oriented to person place and time    EKG:  EKG is not ordered today. The ekg ordered 5/11 demonstrates NSR, rate 79, low voltage limb leads.  No acute ST T wave changes   Recent Labs: 03/21/2014: ALT 11 03/22/2014: Platelets 197 11/01/2014: BUN 22*; Creatinine 1.20; Hemoglobin 16.3; Potassium 4.0; Sodium 138    Lipid Panel No results found for: CHOL, TRIG, HDL, CHOLHDL, VLDL, LDLCALC, LDLDIRECT    Wt Readings from Last 3 Encounters:  11/24/14 184 lb 4.8 oz (83.598 kg)  11/01/14 180 lb (81.647  kg)  10/09/14 185 lb 6.4 oz (84.097 kg)      Other studies Reviewed: Additional studies/ records that were reviewed today include:   Report of lower ext angiogram and vascular surgery notes. Review of the above records demonstrates:  Please see elsewhere in the note.     ASSESSMENT AND PLAN:  PREOP:  The patient is going for high risk procedure. He has a low functional status and known vascular disease. Exercise treadmill testing would be indicated. However, he would be a walk on a treadmill. Therefore, he will have a [employer].  TOBACCO ABUSE:  We had a long discussion about this.  He does want to try Chantix.  We discussed all of the potential side effects and in particular I reviewed the [employer].  He has no depression.  He understands and wishes to try this medication.   Current medicines are reviewed at length with the patient today.  The patient does not have concerns regarding medicines.  The following changes have been made:  no change  Labs/ tests ordered today include:   Orders Placed This Encounter  Procedures  . Myocardial Perfusion Imaging     Disposition:   FU with me as needed    Signed, Minus Breeding, MD  11/24/2014 12:39 PM    [facility]

## 2014-11-24 NOTE — Patient Instructions (Signed)
Your physician recommends that you schedule a follow-up appointment in: as needed with Dr. Percival Spanish  We have ordered a stress test for you to get done

## 2014-11-27 ENCOUNTER — Ambulatory Visit: Payer: BLUE CROSS/BLUE SHIELD | Admitting: Surgery

## 2014-11-29 ENCOUNTER — Encounter: Payer: Self-pay | Admitting: Surgery

## 2014-11-29 ENCOUNTER — Telehealth (HOSPITAL_COMMUNITY): Payer: Self-pay

## 2014-11-29 NOTE — Telephone Encounter (Signed)
Encounter complete. 

## 2014-11-30 ENCOUNTER — Ambulatory Visit (HOSPITAL_COMMUNITY)
Admission: RE | Admit: 2014-11-30 | Discharge: 2014-11-30 | Disposition: A | Discharge status: Home / Self Care | Payer: BLUE CROSS/BLUE SHIELD | Source: Ambulatory Visit | Attending: Cardiovascular Disease | Admitting: Cardiovascular Disease

## 2014-11-30 DIAGNOSIS — R072 Precordial pain: Secondary | ICD-10-CM | POA: Insufficient documentation

## 2014-11-30 LAB — MYOCARDIAL PERFUSION IMAGING
CHL CUP NUCLEAR SRS: 1
CHL CUP NUCLEAR SSS: 2
LV dias vol: 64 mL
LV sys vol: 22 mL
Nuc Stress EF: 66 %
Peak HR: 123 {beats}/min
Rest HR: 100 {beats}/min
SDS: 1
TID: 1.03

## 2014-11-30 MED ORDER — TECHNETIUM TC 99M SESTAMIBI GENERIC - CARDIOLITE
10.9000 | Freq: Once | INTRAVENOUS | Status: AC | PRN
  Administered 2014-11-30: 10.9 via INTRAVENOUS

## 2014-11-30 MED ORDER — REGADENOSON 0.4 MG/5ML IV SOLN
0.4000 mg | Freq: Once | INTRAVENOUS | Status: AC
  Administered 2014-11-30: 0.4 mg via INTRAVENOUS

## 2014-11-30 MED ORDER — TECHNETIUM TC 99M SESTAMIBI GENERIC - CARDIOLITE
31.4000 | Freq: Once | INTRAVENOUS | Status: AC | PRN
  Administered 2014-11-30: 31 via INTRAVENOUS

## 2014-11-30 MED ORDER — AMINOPHYLLINE 25 MG/ML IV SOLN
75.0000 mg | Freq: Once | INTRAVENOUS | Status: AC
  Administered 2014-11-30: 75 mg via INTRAVENOUS

## 2014-12-04 ENCOUNTER — Ambulatory Visit (INDEPENDENT_AMBULATORY_CARE_PROVIDER_SITE_OTHER): Payer: BLUE CROSS/BLUE SHIELD | Admitting: Surgery

## 2014-12-04 ENCOUNTER — Encounter: Payer: Self-pay | Admitting: Surgery

## 2014-12-04 ENCOUNTER — Other Ambulatory Visit: Payer: Self-pay

## 2014-12-04 ENCOUNTER — Ambulatory Visit (HOSPITAL_COMMUNITY)
Admission: RE | Admit: 2014-12-04 | Discharge: 2014-12-04 | Disposition: A | Discharge status: Home / Self Care | Payer: BLUE CROSS/BLUE SHIELD | Source: Ambulatory Visit | Attending: Surgery | Admitting: Surgery

## 2014-12-04 ENCOUNTER — Other Ambulatory Visit: Payer: Self-pay | Admitting: Surgery

## 2014-12-04 VITALS — BP 112/65 | HR 103 | Ht 70.0 in | Wt 183.3 lb

## 2014-12-04 DIAGNOSIS — I70219 Atherosclerosis of native arteries of extremities with intermittent claudication, unspecified extremity: Secondary | ICD-10-CM

## 2014-12-04 DIAGNOSIS — Z01818 Encounter for other preprocedural examination: Secondary | ICD-10-CM | POA: Diagnosis not present

## 2014-12-04 DIAGNOSIS — I6523 Occlusion and stenosis of bilateral carotid arteries: Secondary | ICD-10-CM | POA: Insufficient documentation

## 2014-12-04 NOTE — Progress Notes (Addendum)
Patient name: Chad Gomez MRN: [id number] DOB: 1946/03/26 Sex: male     Chief Complaint  Patient presents with  . Re-evaluation    2-3 wk f/u to discuss aortobifem BP     HISTORY OF PRESENT ILLNESS: The patient is back for follow-up.  He continues to complain of left leg claudication.  He did not tolerate retiled.  He recently underwent angiography which confirmed left external iliac occlusion.  He wants to proceed with surgery.  The patient has been cleared from a cardiology standpoint with a low risk stress test.  He has a long history of hypertension which is managed medically with dual medications.  His hypercholesterolemia is managed with a statin.  He has recently purchased Chantix to try to stop smoking.  Past Medical History  Diagnosis Date  . Hypertension   . Iron deficiency   . History of bladder stone   . Seasonal allergies   . BPH (benign prostatic hypertrophy)   . Right ureteral stone   . History of melanoma excision     LEFT EAR  . History of colon polyps     08/2013   pre-canerous  . Complication of anesthesia     urinary retention  . Chronic kidney disease     Past Surgical History  Procedure Laterality Date  . Cystolithalopaxy of bladder stone/ transrectal ultrasound prostate bx  08-19-2005  . Cystoscopy with litholapaxy Right 12/02/2012    Procedure: CYSTOSCOPY WITH stone extraction;  Surgeon: Malka So, MD;  Location: [facility];  Service: Urology;  Laterality: Right;  . Ureteroscopy Right 12/02/2012    Procedure: RIGHT URETEROSCOPY STONE EXTRACTION WITH  STENT PLACEMENT;  Surgeon: Malka So, MD;  Location: [facility];  Service: Urology;  Laterality: Right;  . Holmium laser application Right 8/67/6720    Procedure: HOLMIUM LASER APPLICATION;  Surgeon: Malka So, MD;  Location: [facility];  Service: Urology;  Laterality: Right;  . Cystoscopy w/ retrogrades Right 12/02/2012    Procedure:  CYSTOSCOPY WITH RETROGRADE PYELOGRAM;  Surgeon: Malka So, MD;  Location: [facility];  Service: Urology;  Laterality: Right;  . Nephrolithotomy Left 01/27/2013    Procedure: NEPHROLITHOTOMY PERCUTANEOUS FIRST LOOK;  Surgeon: Malka So, MD;  Location: [facility];  Service: Urology;  Laterality: Left;  Marland Kitchen Mohs surgery  1996    left ear  . Multiple tooth extractions  1994  . Mandible fracture surgery  1995  . Inguinal hernia repair Right 1954  . Colonoscopy w/ polypectomy  09-13-2013  . Cystoscopy with litholapaxy Right 10/13/2013    Procedure: CYSTOSCOPY WITH LITHOLAPAXY, removal of urethral stone with basket;  Surgeon: Irine Seal, MD;  Location: [facility];  Service: Urology;  Laterality: Right;  . Cystoscopy w/ retrogrades Bilateral 10/13/2013    Procedure: CYSTOSCOPY WITH RETROGRADE PYELOGRAM;  Surgeon: Irine Seal, MD;  Location: [facility];  Service: Urology;  Laterality: Bilateral;  . Lithotripsy  2007 & 02/2013  . Cystoscopy with retrograde pyelogram, ureteroscopy and stent placement Left 03/21/2014    Procedure: CYSTOSCOPY WITH RETROGRADE PYELOGRAM, AND LEFT STENT PLACEMENT;  Surgeon: Alexis Frock, MD;  Location: [facility];  Service: Urology;  Laterality: Left;  . Holmium laser application Left 94/70/9628    Procedure: HOLMIUM LASER APPLICATION;  Surgeon: Malka So, MD;  Location: [facility];  Service: Urology;  Laterality: Left;  . Cystoscopy w/ ureteral stent placement Left 04/11/2014  Procedure: CYSTOSCOPY WITH STENT REPLACEMENT;  Surgeon: Malka So, MD;  Location: [facility];  Service: Urology;  Laterality: Left;  . Cystoscopy with ureteroscopy Left 04/11/2014    Procedure: CYSTOSCOPY WITH URETEROSCOPY/ STONE EXTRACTION;  Surgeon: Malka So, MD;  Location: [facility];  Service: Urology;  Laterality: Left;  . Peripheral vascular catheterization N/A 11/01/2014    Procedure: Lower  Extremity Angiography;  Surgeon: Serafina Mitchell, MD;  Location: [place] [facility];  Service: Cardiovascular;  Laterality: N/A;  . Abdominal aortagram  11/01/2014    Procedure: Abdominal Aortagram;  Surgeon: Serafina Mitchell, MD;  Location: [place] [facility];  Service: Cardiovascular;;    History   Social History  . Marital Status: Married    Spouse Name: N/A  . Number of Children: 2  . Years of Education: N/A   Occupational History  . Not on file.   Social History Main Topics  . Smoking status: Current Every Day Smoker -- 2.00 packs/day for 50 years    Types: Cigarettes  . Smokeless tobacco: Never Used     Comment: PT HAS CUT DOWN TO 1PPD FROM 2 PPD LAST YR (2014)  . Alcohol Use: No  . Drug Use: No  . Sexual Activity: Not on file   Other Topics Concern  . Not on file   Social History Narrative   Patient is married with 2 children.   Patient is right handed.   Patient has some college education.   Patient does not drink caffeine.    Family History  Problem Relation Age of Onset  . Colon cancer Neg Hx   . Dementia Mother   . Hypertension Mother   . Dementia Father   . Hypertension Sister   . Stroke Mother 12    Allergies as of 12/04/2014 - Review Complete 12/04/2014  Allergen Reaction Noted  . Tape Other (See Comments) and Rash 09/02/2013  . Amoxicillin Other (See Comments) 11/26/2012  . Ibuprofen Swelling 11/26/2012  . Naproxen Swelling 11/26/2012    Current Outpatient Prescriptions on File Prior to Visit  Medication Sig Dispense Refill  . amLODipine (NORVASC) 10 MG tablet Take 10 mg by mouth every morning.     Marland Kitchen aspirin EC 81 MG tablet Take 81 mg by mouth daily.    . cholecalciferol (VITAMIN D) 1000 UNITS tablet Take 1,000 Units by mouth daily.    . cilostazol (PLETAL) 100 MG tablet Take 1 tablet (100 mg total) by mouth 2 (two) times daily before a meal. 60 tablet 11  . Cyanocobalamin (VITAMIN B-12 PO) Take 1 capsule by mouth daily.    . Ferrous Sulfate  (IRON) 325 (65 FE) MG TABS Take 1 tablet by mouth every morning.    . fluticasone (FLONASE) 50 MCG/ACT nasal spray Place 2 sprays into the nose 2 (two) times daily.    . indapamide (LOZOL) 2.5 MG tablet Take 2.5 mg by mouth daily.    Marland Kitchen loratadine (CLARITIN) 10 MG tablet Take 10 mg by mouth daily.    . potassium citrate (UROCIT-K) 10 MEQ (1080 MG) SR tablet Take 10 mEq by mouth 3 (three) times daily with meals.    . pravastatin (PRAVACHOL) 20 MG tablet Take 20 mg by mouth daily.    . varenicline (CHANTIX) 0.5 MG tablet Take 1 tablet (0.5 mg total) by mouth 2 (two) times daily. 60 tablet 3   No current facility-administered medications on file prior to visit.     REVIEW OF  SYSTEMS: Cardiovascular: No chest pain, chest pressure, palpitations, orthopnea, or dyspnea on exertion.  Left leg claudication Pulmonary: No productive cough, asthma or wheezing. Neurologic: No weakness, paresthesias, aphasia, or amaurosis. No dizziness. Hematologic: No bleeding problems or clotting disorders. Musculoskeletal: No joint pain or joint swelling. Gastrointestinal: No blood in stool or hematemesis Genitourinary: No dysuria or hematuria. Psychiatric:: No history of major depression. Integumentary: No rashes or ulcers. Constitutional: No fever or chills.  PHYSICAL EXAMINATION:   Vital signs are  Filed Vitals:   12/04/14 1127  BP: 112/65  Pulse: 103  Height: '5\' 10"'$  (1.778 m)  Weight: 183 lb 4.8 oz (83.144 kg)  SpO2: 95%   Body mass index is 26.3 kg/(m^2). General: The patient appears their stated age. HEENT:  No gross abnormalities Pulmonary:  Non labored breathing Abdomen: Soft and non-tender Musculoskeletal: There are no major deformities. Neurologic: No focal weakness or paresthesias are detected, Skin: There are no ulcer or rashes noted. Psychiatric: The patient has normal affect. Cardiovascular: There is a regular rate and rhythm without significant murmur appreciated.   Diagnostic  Studies Carotid duplex: Less than 40% stenosis bilaterally  Assessment: Occluded left external iliac artery Plan: I discussed proceeding with aortobifemoral bypass graft.  We discussed the risks and benefits including the risk of intestinal ischemia, lower extremity ischemia, sexual dysfunction, wound complications, and cardiopulmonary complications.  The patient wishes to proceed.  His operations been scheduled for Thursday, July 7.  He will need to stop taking Pletal one week prior to his operation.  He is getting carotid Doppler studies today.  He is going to try and stop smoking.  I told him to wait to start the Chantix until after the operation in case it has deleterious side effects for him.  Eldridge Abrahams, M.D. [facility] Office: [phone] Pager:  [phone]

## 2014-12-04 NOTE — Patient Instructions (Addendum)
Intermittent Claudication Blockage of leg arteries results from poor circulation of blood in the leg arteries. This produces an aching, tired, and sometimes burning pain in the legs that is brought on by exercise and made better by rest. Claudication refers to the limping that happens from leg cramps. It is also referred to as Vaso-occlusive disease of the legs, arterial insufficiency of the legs, recurrent leg pain, recurrent leg cramping and calf pain with exercise.  CAUSES  This condition is due to narrowing or blockage of the arteries (muscular vessels which carry blood away from the heart and around the body). Blockage of arteries can occur anywhere in the body. If they occur in the heart, a person may experience angina (chest pain) or even a heart attack. If they occur in the neck or the brain, a person may have a stroke. Intermittent claudication is when the blockage occurs in the legs, most commonly in the calf or the foot.  Atherosclerosis, or blockage of arteries, can occur for many reasons. Some of these are smoking, diabetes, and high cholesterol. SYMPTOMS  Intermittent claudication may occur in both legs, and it often continues to get worse over time. However, some people complain only of weakness in the legs when walking, or a feeling of "tiredness" in the buttocks. Impotence (not able to have an erection) is an occasional complaint in men. Pain while resting is uncommon.  WHAT TO EXPECT AT YOUR HEALTH CARE PROVIDER'S OFFICE: Your medical history will be asked for and a physical examination will be performed. Medical history questions documenting claudication in detail may include:   Time pattern  Do you have leg cramps at night (nocturnal cramps)?  How often does leg pain with cramping occur?  Is it getting worse?  What is the quality of the pain?  Is the pain sharp?  Is there an aching pain with the cramps?  Aggravating factors  Is it worse after you exercise?  Is it  worse after you are standing for a while?  Do you smoke? How much?  Do you drink alcohol? How much?  Are you diabetic? How well is your blood sugar controlled?  Other  What other symptoms are also present?  Has there been impotence (men)?  Is there pain in the back?  Is there a darkening of the skin of the legs, feet or toes?  Is there weakness or paralysis of the legs? The physical examination may include evaluation of the femoral pulse (in the groin) and the other areas where the pulse can be felt in the legs. DIAGNOSIS  Diagnostic tests that may be performed include:  Blood pressure measured in arms and legs for comparison.  Doppler ultrasonography on the legs and the heart.  Duplex Doppler/ultrasound exam of extremity to visualize arterial blood flow.  ECG- to evaluate the activity of your heart.  Aortography- to visualize blockages in your arteries. TREATMENT Surgical treatment may be suggested if claudication interferes with the patient's activities or work, and if the diseased arteries do not seem to be improving after treatment. Be aware that this condition can worsen over time and you should carefully monitor your condition. HOME CARE INSTRUCTIONS  Talk to your caregiver about the cause of your leg cramping and about what to do at home to relieve it.  A healthy diet is important to lessen the likeliness of atherosclerosis.  A program of daily walking for short periods, and stopping for pain or cramping, may help improve function.  It is important to   stop smoking.  Avoid putting hot or cold items on legs.  Avoid tight shoes. SEEK MEDICAL CARE IF: There are many other causes of leg pain such as arthritis or low blood potassium. However, some causes of leg pain may be life threatening such as a blood clot in the legs. Seek medical attention if you have:  Leg pain that does not go away.  Legs that may be red, hot or swollen.  Ulcers or sores appear on your  ankle or foot.  Any chest pain or shortness of breath accompanying leg pain.  Diabetes.  You are pregnant. SEEK IMMEDIATE MEDICAL CARE IF:   Your leg pain becomes severe or will not go away.  Your foot turns blue or a dark color.  Your leg becomes red, hot or swollen or you develop a fever over 102F.  Any chest pain or shortness of breath accompanying leg pain. MAKE SURE YOU:   Understand these instructions.  Will watch your condition.   Peripheral Vascular Disease Peripheral Vascular Disease (PVD), also called Peripheral Arterial Disease (PAD), is a circulation problem caused by cholesterol (atherosclerotic plaque) deposits in the arteries. PVD commonly occurs in the lower extremities (legs) but it can occur in other areas of the body, such as your arms. The cholesterol buildup in the arteries reduces blood flow which can cause pain and other serious problems. The presence of PVD can place a person at risk for Coronary Artery Disease (CAD).  CAUSES  Causes of PVD can be many. It is usually associated with more than one risk factor such as:   High Cholesterol.  Smoking.  Diabetes.  Lack of exercise or inactivity.  High blood pressure (hypertension).  Obesity.  Family history. SYMPTOMS   When the lower extremities are affected, patients with PVD may experience:  Leg pain with exertion or physical activity. This is called INTERMITTENT CLAUDICATION. This may present as cramping or numbness with physical activity. The location of the pain is associated with the level of blockage. For example, blockage at the abdominal level (distal abdominal aorta) may result in buttock or hip pain. Lower leg arterial blockage may result in calf pain.  As PVD becomes more severe, pain can develop with less physical activity.  In people with severe PVD, leg pain may occur at rest.  Other PVD signs and symptoms:  Leg numbness or weakness.  Coldness in the affected leg or foot,  especially when compared to the other leg.  A change in leg color.  Patients with significant PVD are more prone to ulcers or sores on toes, feet or legs. These may take longer to heal or may reoccur. The ulcers or sores can become infected.  If signs and symptoms of PVD are ignored, gangrene may occur. This can result in the loss of toes or loss of an entire limb.  Not all leg pain is related to PVD. Other medical conditions can cause leg pain such as:  Blood clots (embolism) or Deep Vein Thrombosis.  Inflammation of the blood vessels (vasculitis).  Spinal stenosis. DIAGNOSIS  Diagnosis of PVD can involve several different types of tests. These can include:  Pulse Volume Recording Method (PVR). This test is simple, painless and does not involve the use of X-rays. PVR involves measuring and comparing the blood pressure in the arms and legs. An ABI (Ankle-Brachial Index) is calculated. The normal ratio of blood pressures is 1. As this number becomes smaller, it indicates more severe disease.  < 0.95 - indicates significant  narrowing in one or more leg vessels.  <0.8 - there will usually be pain in the foot, leg or buttock with exercise.  <0.4 - will usually have pain in the legs at rest.  <0.25 - usually indicates limb threatening PVD.  Doppler detection of pulses in the legs. This test is painless and checks to see if you have a pulses in your legs/feet.  A dye or contrast material (a substance that highlights the blood vessels so they show up on x-ray) may be given to help your caregiver better see the arteries for the following tests. The dye is eliminated from your body by the kidney's. Your caregiver may order blood work to check your kidney function and other laboratory values before the following tests are performed:  Magnetic Resonance Angiography (MRA). An MRA is a picture study of the blood vessels and arteries. The MRA machine uses a large magnet to produce images of the  blood vessels.  Computed Tomography Angiography (CTA). A CTA is a specialized x-ray that looks at how the blood flows in your blood vessels. An IV may be inserted into your arm so contrast dye can be injected.  Angiogram. Is a procedure that uses x-rays to look at your blood vessels. This procedure is minimally invasive, meaning a small incision (cut) is made in your groin. A small tube (catheter) is then inserted into the artery of your groin. The catheter is guided to the blood vessel or artery your caregiver wants to examine. Contrast dye is injected into the catheter. X-rays are then taken of the blood vessel or artery. After the images are obtained, the catheter is taken out. TREATMENT  Treatment of PVD involves many interventions which may include:  Lifestyle changes:  Quitting smoking.  Exercise.  Following a low fat, low cholesterol diet.  Control of diabetes.  Foot care is very important to the PVD patient. Good foot care can help prevent infection.  Medication:  Cholesterol-lowering medicine.  Blood pressure medicine.  Anti-platelet drugs.  Certain medicines may reduce symptoms of Intermittent Claudication.  Interventional/Surgical options:  Angioplasty. An Angioplasty is a procedure that inflates a balloon in the blocked artery. This opens the blocked artery to improve blood flow.  Stent Implant. A wire mesh tube (stent) is placed in the artery. The stent expands and stays in place, allowing the artery to remain open.  Peripheral Bypass Surgery. This is a surgical procedure that reroutes the blood around a blocked artery to help improve blood flow. This type of procedure may be performed if Angioplasty or stent implants are not an option. SEEK IMMEDIATE MEDICAL CARE IF:   You develop pain or numbness in your arms or legs.  Your arm or leg turns cold, becomes blue in color.  You develop redness, warmth, swelling and pain in your arms or legs. MAKE SURE YOU:    Understand these instructions.  Will watch your condition.  Will get help right away if you are not doing well or get worse.

## 2014-12-05 NOTE — Progress Notes (Signed)
Pt. Informed about his stress test and results were sent to Dr. Shelia Media and Dr. Trula Slade

## 2014-12-15 ENCOUNTER — Encounter (HOSPITAL_COMMUNITY)
Admission: RE | Admit: 2014-12-15 | Discharge: 2014-12-15 | Disposition: A | Discharge status: Home / Self Care | Payer: BLUE CROSS/BLUE SHIELD | Source: Ambulatory Visit | Attending: Surgery | Admitting: Surgery

## 2014-12-15 ENCOUNTER — Other Ambulatory Visit (HOSPITAL_COMMUNITY): Payer: Self-pay | Admitting: *Deleted

## 2014-12-15 ENCOUNTER — Encounter (HOSPITAL_COMMUNITY): Payer: Self-pay

## 2014-12-15 DIAGNOSIS — Z01812 Encounter for preprocedural laboratory examination: Secondary | ICD-10-CM | POA: Insufficient documentation

## 2014-12-15 DIAGNOSIS — Z0183 Encounter for blood typing: Secondary | ICD-10-CM | POA: Insufficient documentation

## 2014-12-15 DIAGNOSIS — I745 Embolism and thrombosis of iliac artery: Secondary | ICD-10-CM | POA: Diagnosis not present

## 2014-12-15 HISTORY — DX: Peripheral vascular disease, unspecified: I73.9

## 2014-12-15 LAB — URINALYSIS, ROUTINE W REFLEX MICROSCOPIC
Bilirubin Urine: NEGATIVE
GLUCOSE, UA: NEGATIVE mg/dL
Hgb urine dipstick: NEGATIVE
Ketones, ur: NEGATIVE mg/dL
Nitrite: POSITIVE — AB
PROTEIN: NEGATIVE mg/dL
Specific Gravity, Urine: 1.015 (ref 1.005–1.030)
Urobilinogen, UA: 0.2 mg/dL (ref 0.0–1.0)
pH: 7 (ref 5.0–8.0)

## 2014-12-15 LAB — PROTIME-INR
INR: 1.17 (ref 0.00–1.49)
Prothrombin Time: 15.1 seconds (ref 11.6–15.2)

## 2014-12-15 LAB — COMPREHENSIVE METABOLIC PANEL
ALBUMIN: 3.9 g/dL (ref 3.5–5.0)
ALT: 14 U/L — ABNORMAL LOW (ref 17–63)
AST: 18 U/L (ref 15–41)
Alkaline Phosphatase: 73 U/L (ref 38–126)
Anion gap: 8 (ref 5–15)
BILIRUBIN TOTAL: 0.6 mg/dL (ref 0.3–1.2)
BUN: 23 mg/dL — AB (ref 6–20)
CHLORIDE: 102 mmol/L (ref 101–111)
CO2: 29 mmol/L (ref 22–32)
CREATININE: 1.34 mg/dL — AB (ref 0.61–1.24)
Calcium: 10 mg/dL (ref 8.9–10.3)
GFR calc non Af Amer: 53 mL/min — ABNORMAL LOW (ref >60)
GLUCOSE: 102 mg/dL — AB (ref 65–99)
Potassium: 3.9 mmol/L (ref 3.5–5.1)
Sodium: 139 mmol/L (ref 135–145)
Total Protein: 6.5 g/dL (ref 6.5–8.1)

## 2014-12-15 LAB — URINE MICROSCOPIC-ADD ON

## 2014-12-15 LAB — CBC
HCT: 45.5 % (ref 39.0–52.0)
Hemoglobin: 15.6 g/dL (ref 13.0–17.0)
MCH: 30.6 pg (ref 26.0–34.0)
MCHC: 34.3 g/dL (ref 30.0–36.0)
MCV: 89.4 fL (ref 78.0–100.0)
PLATELETS: 193 10*3/uL (ref 150–400)
RBC: 5.09 MIL/uL (ref 4.22–5.81)
RDW: 12.8 % (ref 11.5–15.5)
WBC: 9.6 10*3/uL (ref 4.0–10.5)

## 2014-12-15 LAB — SURGICAL PCR SCREEN
MRSA, PCR: NEGATIVE
Staphylococcus aureus: NEGATIVE

## 2014-12-15 LAB — ABO/RH: ABO/RH(D): O POS

## 2014-12-15 LAB — APTT: APTT: 30 s (ref 24–37)

## 2014-12-15 NOTE — Pre-Procedure Instructions (Signed)
    Chad Gomez  12/15/2014     Your procedure is scheduled on Thursday, December 28, 2014 at 7:30 AM.   Report to [facility] Entrance "A" Admitting Office at 5:30 AM.   Call this number if you have problems the morning of surgery: [phone]   Any questions prior to day of surgery, please call [phone] between 8 & 4 PM.    Remember:  Do not eat food or drink liquids after midnight Wednesday, 12/27/14.  Take these medicines the morning of surgery with A SIP OF WATER: Amlodipine (Norvasc), Aspirin, Loratadine (Claritin), Flonase  Stop Pletal 7 days prior to surgery.   Do not wear jewelry.  Do not wear lotions, powders, or cologne.  You may wear deodorant.  Men may shave face and neck.  Do not bring valuables to the hospital.  [facility] is not responsible for any belongings or valuables.  Contacts, dentures or bridgework may not be worn into surgery.  Leave your suitcase in the car.  After surgery it may be brought to your room.  For patients admitted to the hospital, discharge time will be determined by your treatment team.  Special instructions:  See "Preparing for Surgery" Instruction sheet.   Please read over the following fact sheets that you were given. Pain Booklet, Coughing and Deep Breathing, Blood Transfusion Information, MRSA Information and Surgical Site Infection Prevention

## 2014-12-18 ENCOUNTER — Other Ambulatory Visit: Payer: Self-pay

## 2014-12-27 DIAGNOSIS — F22 Delusional disorders: Secondary | ICD-10-CM | POA: Diagnosis not present

## 2014-12-27 DIAGNOSIS — E611 Iron deficiency: Secondary | ICD-10-CM | POA: Diagnosis not present

## 2014-12-27 DIAGNOSIS — E780 Pure hypercholesterolemia: Secondary | ICD-10-CM | POA: Diagnosis not present

## 2014-12-27 DIAGNOSIS — F17210 Nicotine dependence, cigarettes, uncomplicated: Secondary | ICD-10-CM | POA: Diagnosis not present

## 2014-12-27 DIAGNOSIS — E872 Acidosis: Secondary | ICD-10-CM | POA: Diagnosis not present

## 2014-12-27 DIAGNOSIS — Z7902 Long term (current) use of antithrombotics/antiplatelets: Secondary | ICD-10-CM | POA: Diagnosis not present

## 2014-12-27 DIAGNOSIS — I7409 Other arterial embolism and thrombosis of abdominal aorta: Secondary | ICD-10-CM | POA: Diagnosis not present

## 2014-12-27 DIAGNOSIS — N189 Chronic kidney disease, unspecified: Secondary | ICD-10-CM | POA: Diagnosis not present

## 2014-12-27 DIAGNOSIS — I129 Hypertensive chronic kidney disease with stage 1 through stage 4 chronic kidney disease, or unspecified chronic kidney disease: Secondary | ICD-10-CM | POA: Diagnosis not present

## 2014-12-27 DIAGNOSIS — N400 Enlarged prostate without lower urinary tract symptoms: Secondary | ICD-10-CM | POA: Diagnosis not present

## 2014-12-27 DIAGNOSIS — Z7982 Long term (current) use of aspirin: Secondary | ICD-10-CM | POA: Diagnosis not present

## 2014-12-27 DIAGNOSIS — E871 Hypo-osmolality and hyponatremia: Secondary | ICD-10-CM | POA: Diagnosis not present

## 2014-12-27 DIAGNOSIS — Z85820 Personal history of malignant melanoma of skin: Secondary | ICD-10-CM | POA: Diagnosis not present

## 2014-12-27 DIAGNOSIS — I745 Embolism and thrombosis of iliac artery: Secondary | ICD-10-CM | POA: Diagnosis not present

## 2014-12-27 MED ORDER — VANCOMYCIN HCL IN DEXTROSE 1-5 GM/200ML-% IV SOLN
1000.0000 mg | INTRAVENOUS | Status: AC
  Administered 2014-12-28: 1000 mg via INTRAVENOUS
  Filled 2014-12-27: qty 200

## 2014-12-28 ENCOUNTER — Inpatient Hospital Stay (HOSPITAL_COMMUNITY): Payer: BLUE CROSS/BLUE SHIELD | Admitting: Anesthesiology

## 2014-12-28 ENCOUNTER — Inpatient Hospital Stay (HOSPITAL_COMMUNITY): Payer: BLUE CROSS/BLUE SHIELD

## 2014-12-28 ENCOUNTER — Encounter (HOSPITAL_COMMUNITY)
Admission: RE | Disposition: A | Discharge status: Home Health | Payer: BLUE CROSS/BLUE SHIELD | Source: Ambulatory Visit | Attending: Surgery

## 2014-12-28 ENCOUNTER — Inpatient Hospital Stay (HOSPITAL_COMMUNITY)
Admission: RE | Admit: 2014-12-28 | Discharge: 2015-01-04 | DRG: 271 | Disposition: A | Discharge status: Home Health | Payer: BLUE CROSS/BLUE SHIELD | Source: Ambulatory Visit | Attending: Surgery | Admitting: Surgery

## 2014-12-28 ENCOUNTER — Encounter (HOSPITAL_COMMUNITY): Payer: Self-pay | Admitting: *Deleted

## 2014-12-28 DIAGNOSIS — N189 Chronic kidney disease, unspecified: Secondary | ICD-10-CM | POA: Diagnosis present

## 2014-12-28 DIAGNOSIS — Z7902 Long term (current) use of antithrombotics/antiplatelets: Secondary | ICD-10-CM | POA: Diagnosis not present

## 2014-12-28 DIAGNOSIS — I70212 Atherosclerosis of native arteries of extremities with intermittent claudication, left leg: Secondary | ICD-10-CM | POA: Diagnosis not present

## 2014-12-28 DIAGNOSIS — K8020 Calculus of gallbladder without cholecystitis without obstruction: Secondary | ICD-10-CM | POA: Diagnosis not present

## 2014-12-28 DIAGNOSIS — E871 Hypo-osmolality and hyponatremia: Secondary | ICD-10-CM | POA: Diagnosis not present

## 2014-12-28 DIAGNOSIS — F22 Delusional disorders: Secondary | ICD-10-CM | POA: Diagnosis not present

## 2014-12-28 DIAGNOSIS — E872 Acidosis: Secondary | ICD-10-CM | POA: Diagnosis not present

## 2014-12-28 DIAGNOSIS — J811 Chronic pulmonary edema: Secondary | ICD-10-CM | POA: Diagnosis not present

## 2014-12-28 DIAGNOSIS — E611 Iron deficiency: Secondary | ICD-10-CM | POA: Diagnosis present

## 2014-12-28 DIAGNOSIS — R0989 Other specified symptoms and signs involving the circulatory and respiratory systems: Secondary | ICD-10-CM | POA: Diagnosis not present

## 2014-12-28 DIAGNOSIS — I517 Cardiomegaly: Secondary | ICD-10-CM | POA: Diagnosis not present

## 2014-12-28 DIAGNOSIS — Z9889 Other specified postprocedural states: Secondary | ICD-10-CM | POA: Diagnosis not present

## 2014-12-28 DIAGNOSIS — Z85820 Personal history of malignant melanoma of skin: Secondary | ICD-10-CM | POA: Diagnosis not present

## 2014-12-28 DIAGNOSIS — N400 Enlarged prostate without lower urinary tract symptoms: Secondary | ICD-10-CM | POA: Diagnosis present

## 2014-12-28 DIAGNOSIS — I745 Embolism and thrombosis of iliac artery: Secondary | ICD-10-CM | POA: Diagnosis present

## 2014-12-28 DIAGNOSIS — N200 Calculus of kidney: Secondary | ICD-10-CM | POA: Diagnosis not present

## 2014-12-28 DIAGNOSIS — I7409 Other arterial embolism and thrombosis of abdominal aorta: Principal | ICD-10-CM | POA: Diagnosis present

## 2014-12-28 DIAGNOSIS — E780 Pure hypercholesterolemia: Secondary | ICD-10-CM | POA: Diagnosis present

## 2014-12-28 DIAGNOSIS — Z95828 Presence of other vascular implants and grafts: Secondary | ICD-10-CM

## 2014-12-28 DIAGNOSIS — Z7982 Long term (current) use of aspirin: Secondary | ICD-10-CM | POA: Diagnosis not present

## 2014-12-28 DIAGNOSIS — I129 Hypertensive chronic kidney disease with stage 1 through stage 4 chronic kidney disease, or unspecified chronic kidney disease: Secondary | ICD-10-CM | POA: Diagnosis present

## 2014-12-28 DIAGNOSIS — F17210 Nicotine dependence, cigarettes, uncomplicated: Secondary | ICD-10-CM | POA: Diagnosis present

## 2014-12-28 DIAGNOSIS — R918 Other nonspecific abnormal finding of lung field: Secondary | ICD-10-CM | POA: Diagnosis not present

## 2014-12-28 HISTORY — PX: AORTA - BILATERAL FEMORAL ARTERY BYPASS GRAFT: SHX1175

## 2014-12-28 LAB — POCT I-STAT 3, ART BLOOD GAS (G3+)
ACID-BASE DEFICIT: 4 mmol/L — AB (ref 0.0–2.0)
Acid-base deficit: 1 mmol/L (ref 0.0–2.0)
Acid-base deficit: 2 mmol/L (ref 0.0–2.0)
BICARBONATE: 27.1 meq/L — AB (ref 20.0–24.0)
Bicarbonate: 23.9 mEq/L (ref 20.0–24.0)
Bicarbonate: 25.4 mEq/L — ABNORMAL HIGH (ref 20.0–24.0)
O2 SAT: 97 %
O2 SAT: 97 %
O2 Saturation: 86 %
PCO2 ART: 58.1 mmHg — AB (ref 35.0–45.0)
PCO2 ART: 52.3 mmHg — AB (ref 35.0–45.0)
PH ART: 7.277 — AB (ref 7.350–7.450)
Patient temperature: 98.6
Patient temperature: 97.6
Patient temperature: 97.6
TCO2: 29 mmol/L (ref 0–100)
TCO2: 26 mmol/L (ref 0–100)
TCO2: 27 mmol/L (ref 0–100)
pCO2 arterial: 54.6 mmHg — ABNORMAL HIGH (ref 35.0–45.0)
pH, Arterial: 7.247 — ABNORMAL LOW (ref 7.350–7.450)
pH, Arterial: 7.291 — ABNORMAL LOW (ref 7.350–7.450)
pO2, Arterial: 60 mmHg — ABNORMAL LOW (ref 80.0–100.0)
pO2, Arterial: 99 mmHg (ref 80.0–100.0)
pO2, Arterial: 104 mmHg — ABNORMAL HIGH (ref 80.0–100.0)

## 2014-12-28 LAB — APTT: aPTT: 26 seconds (ref 24–37)

## 2014-12-28 LAB — BLOOD GAS, ARTERIAL
Acid-Base Excess: 3.4 mmol/L — ABNORMAL HIGH (ref 0.0–2.0)
Bicarbonate: 27.7 mEq/L — ABNORMAL HIGH (ref 20.0–24.0)
Drawn by: 428831
FIO2: 0.21 %
O2 Saturation: 93 %
PCO2 ART: 44 mmHg (ref 35.0–45.0)
Patient temperature: 98.6
TCO2: 29 mmol/L (ref 0–100)
pH, Arterial: 7.415 (ref 7.350–7.450)
pO2, Arterial: 63.3 mmHg — ABNORMAL LOW (ref 80.0–100.0)

## 2014-12-28 LAB — CBC
HCT: 42.9 % (ref 39.0–52.0)
Hemoglobin: 14.4 g/dL (ref 13.0–17.0)
MCH: 30.8 pg (ref 26.0–34.0)
MCHC: 33.6 g/dL (ref 30.0–36.0)
MCV: 91.7 fL (ref 78.0–100.0)
Platelets: 163 10*3/uL (ref 150–400)
RBC: 4.68 MIL/uL (ref 4.22–5.81)
RDW: 12.9 % (ref 11.5–15.5)
WBC: 17.4 10*3/uL — AB (ref 4.0–10.5)

## 2014-12-28 LAB — PREPARE RBC (CROSSMATCH)

## 2014-12-28 LAB — MAGNESIUM: MAGNESIUM: 1.7 mg/dL (ref 1.7–2.4)

## 2014-12-28 LAB — PROTIME-INR
INR: 1.16 (ref 0.00–1.49)
Prothrombin Time: 15 seconds (ref 11.6–15.2)

## 2014-12-28 SURGERY — CREATION, BYPASS, ARTERIAL, AORTA TO FEMORAL, BILATERAL, USING GRAFT
Anesthesia: General | Site: Abdomen

## 2014-12-28 MED ORDER — ONDANSETRON HCL 4 MG/2ML IJ SOLN: 4.0000 mg | Freq: Four times a day (QID) | INTRAMUSCULAR | Status: DC | PRN

## 2014-12-28 MED ORDER — LABETALOL HCL 5 MG/ML IV SOLN
10.0000 mg | INTRAVENOUS | Status: DC | PRN
  Administered 2014-12-28: 10 mg via INTRAVENOUS
  Filled 2014-12-28: qty 4

## 2014-12-28 MED ORDER — ARTIFICIAL TEARS OP OINT
TOPICAL_OINTMENT | OPHTHALMIC | Status: AC
  Filled 2014-12-28: qty 3.5

## 2014-12-28 MED ORDER — KCL IN DEXTROSE-NACL 20-5-0.45 MEQ/L-%-% IV SOLN
INTRAVENOUS | Status: DC
  Administered 2014-12-28: 100 mL via INTRAVENOUS
  Administered 2014-12-29: 05:00:00 via INTRAVENOUS
  Administered 2014-12-30: 100 mL/h via INTRAVENOUS
  Administered 2014-12-30: 23:00:00 via INTRAVENOUS
  Filled 2014-12-28 (×10): qty 1000

## 2014-12-28 MED ORDER — FENTANYL CITRATE (PF) 250 MCG/5ML IJ SOLN
INTRAMUSCULAR | Status: AC
  Filled 2014-12-28: qty 5

## 2014-12-28 MED ORDER — VECURONIUM BROMIDE 10 MG IV SOLR
INTRAVENOUS | Status: AC
  Filled 2014-12-28: qty 10

## 2014-12-28 MED ORDER — PROMETHAZINE HCL 25 MG/ML IJ SOLN
6.2500 mg | INTRAMUSCULAR | Status: DC | PRN
Start: 2014-12-28 — End: 2014-12-28

## 2014-12-28 MED ORDER — NEOSTIGMINE METHYLSULFATE 10 MG/10ML IV SOLN
INTRAVENOUS | Status: AC
  Filled 2014-12-28: qty 1

## 2014-12-28 MED ORDER — SODIUM CHLORIDE 0.9 % IJ SOLN
INTRAMUSCULAR | Status: AC
  Filled 2014-12-28: qty 10

## 2014-12-28 MED ORDER — MEPERIDINE HCL 25 MG/ML IJ SOLN: 6.2500 mg | INTRAMUSCULAR | Status: DC | PRN

## 2014-12-28 MED ORDER — POLYETHYLENE GLYCOL 3350 17 G PO PACK
17.0000 g | PACK | Freq: Every day | ORAL | Status: DC | PRN
  Filled 2014-12-28: qty 1

## 2014-12-28 MED ORDER — PHENOL 1.4 % MT LIQD: 1.0000 | OROMUCOSAL | Status: DC | PRN

## 2014-12-28 MED ORDER — LACTATED RINGERS IV SOLN
INTRAVENOUS | Status: DC | PRN
  Administered 2014-12-28 (×2): via INTRAVENOUS

## 2014-12-28 MED ORDER — HYDROMORPHONE HCL 1 MG/ML IJ SOLN
0.5000 mg | INTRAMUSCULAR | Status: DC | PRN
  Administered 2014-12-28 (×2): 0.5 mg via INTRAVENOUS

## 2014-12-28 MED ORDER — MORPHINE SULFATE 2 MG/ML IJ SOLN
2.0000 mg | INTRAMUSCULAR | Status: DC | PRN
  Administered 2014-12-29 – 2015-01-01 (×3): 2 mg via INTRAVENOUS
  Filled 2014-12-28 (×3): qty 1

## 2014-12-28 MED ORDER — SODIUM CHLORIDE 0.9 % IR SOLN
Status: DC | PRN
  Administered 2014-12-28: 500 mL

## 2014-12-28 MED ORDER — PROTAMINE SULFATE 10 MG/ML IV SOLN
INTRAVENOUS | Status: DC | PRN
  Administered 2014-12-28: 50 mg via INTRAVENOUS

## 2014-12-28 MED ORDER — PROPOFOL 10 MG/ML IV BOLUS
INTRAVENOUS | Status: DC | PRN
  Administered 2014-12-28: 20 mg via INTRAVENOUS
  Administered 2014-12-28: 30 mg via INTRAVENOUS
  Administered 2014-12-28: 150 mg via INTRAVENOUS
  Administered 2014-12-28: 60 mg via INTRAVENOUS

## 2014-12-28 MED ORDER — GLYCOPYRROLATE 0.2 MG/ML IJ SOLN
INTRAMUSCULAR | Status: AC
  Filled 2014-12-28: qty 3

## 2014-12-28 MED ORDER — GLYCOPYRROLATE 0.2 MG/ML IJ SOLN
INTRAMUSCULAR | Status: DC | PRN
  Administered 2014-12-28: .6 mg via INTRAVENOUS

## 2014-12-28 MED ORDER — ROCURONIUM BROMIDE 100 MG/10ML IV SOLN
INTRAVENOUS | Status: DC | PRN
  Administered 2014-12-28: 50 mg via INTRAVENOUS

## 2014-12-28 MED ORDER — PANTOPRAZOLE SODIUM 40 MG IV SOLR
40.0000 mg | Freq: Every day | INTRAVENOUS | Status: DC
  Administered 2014-12-28 – 2015-01-02 (×6): 40 mg via INTRAVENOUS
  Filled 2014-12-28 (×9): qty 40

## 2014-12-28 MED ORDER — SODIUM CHLORIDE 0.9 % IV SOLN: 500.0000 mL | Freq: Once | INTRAVENOUS | Status: AC | PRN

## 2014-12-28 MED ORDER — MIDAZOLAM HCL 2 MG/2ML IJ SOLN
INTRAMUSCULAR | Status: AC
  Filled 2014-12-28: qty 2

## 2014-12-28 MED ORDER — MIDAZOLAM HCL 5 MG/5ML IJ SOLN
INTRAMUSCULAR | Status: DC | PRN
  Administered 2014-12-28: 2 mg via INTRAVENOUS

## 2014-12-28 MED ORDER — LIDOCAINE HCL (CARDIAC) 20 MG/ML IV SOLN
INTRAVENOUS | Status: AC
  Filled 2014-12-28: qty 5

## 2014-12-28 MED ORDER — CHLORHEXIDINE GLUCONATE CLOTH 2 % EX PADS: 6.0000 | MEDICATED_PAD | Freq: Once | CUTANEOUS | Status: DC

## 2014-12-28 MED ORDER — ALUM & MAG HYDROXIDE-SIMETH 200-200-20 MG/5ML PO SUSP: 15.0000 mL | ORAL | Status: DC | PRN

## 2014-12-28 MED ORDER — PROPOFOL 10 MG/ML IV BOLUS
INTRAVENOUS | Status: AC
  Filled 2014-12-28: qty 20

## 2014-12-28 MED ORDER — SUCCINYLCHOLINE CHLORIDE 20 MG/ML IJ SOLN
INTRAMUSCULAR | Status: AC
  Filled 2014-12-28: qty 1

## 2014-12-28 MED ORDER — ROCURONIUM BROMIDE 50 MG/5ML IV SOLN
INTRAVENOUS | Status: AC
  Filled 2014-12-28: qty 1

## 2014-12-28 MED ORDER — CETYLPYRIDINIUM CHLORIDE 0.05 % MT LIQD
7.0000 mL | Freq: Two times a day (BID) | OROMUCOSAL | Status: DC
  Administered 2014-12-29 – 2015-01-02 (×8): 7 mL via OROMUCOSAL

## 2014-12-28 MED ORDER — CHLORHEXIDINE GLUCONATE 0.12 % MT SOLN
15.0000 mL | Freq: Two times a day (BID) | OROMUCOSAL | Status: DC
  Administered 2014-12-28 – 2015-01-02 (×9): 15 mL via OROMUCOSAL
  Filled 2014-12-28 (×9): qty 15

## 2014-12-28 MED ORDER — METOPROLOL TARTRATE 1 MG/ML IV SOLN
2.0000 mg | INTRAVENOUS | Status: DC | PRN
  Administered 2014-12-29: 5 mg via INTRAVENOUS
  Filled 2014-12-28: qty 5

## 2014-12-28 MED ORDER — ALBUMIN HUMAN 5 % IV SOLN
INTRAVENOUS | Status: DC | PRN
  Administered 2014-12-28: 11:00:00 via INTRAVENOUS

## 2014-12-28 MED ORDER — HEPARIN SODIUM (PORCINE) 1000 UNIT/ML IJ SOLN
INTRAMUSCULAR | Status: AC
  Filled 2014-12-28: qty 1

## 2014-12-28 MED ORDER — ONDANSETRON HCL 4 MG/2ML IJ SOLN
INTRAMUSCULAR | Status: AC
  Filled 2014-12-28: qty 2

## 2014-12-28 MED ORDER — POTASSIUM CHLORIDE CRYS ER 20 MEQ PO TBCR
20.0000 meq | EXTENDED_RELEASE_TABLET | Freq: Every day | ORAL | Status: AC | PRN
  Administered 2015-01-04: 20 meq via ORAL
  Filled 2014-12-28: qty 1

## 2014-12-28 MED ORDER — SODIUM CHLORIDE 0.9 % IV SOLN
INTRAVENOUS | Status: DC
  Administered 2014-12-28: 11:00:00 via INTRAVENOUS

## 2014-12-28 MED ORDER — NEOSTIGMINE METHYLSULFATE 10 MG/10ML IV SOLN
INTRAVENOUS | Status: DC | PRN
  Administered 2014-12-28: 4 mg via INTRAVENOUS

## 2014-12-28 MED ORDER — 0.9 % SODIUM CHLORIDE (POUR BTL) OPTIME
TOPICAL | Status: DC | PRN
  Administered 2014-12-28: 3000 mL

## 2014-12-28 MED ORDER — ENOXAPARIN SODIUM 40 MG/0.4ML ~~LOC~~ SOLN
40.0000 mg | SUBCUTANEOUS | Status: DC
  Administered 2014-12-29 – 2015-01-04 (×7): 40 mg via SUBCUTANEOUS
  Filled 2014-12-28 (×9): qty 0.4

## 2014-12-28 MED ORDER — HEMOSTATIC AGENTS (NO CHARGE) OPTIME
TOPICAL | Status: DC | PRN
  Administered 2014-12-28: 1 via TOPICAL

## 2014-12-28 MED ORDER — GUAIFENESIN-DM 100-10 MG/5ML PO SYRP
15.0000 mL | ORAL_SOLUTION | ORAL | Status: DC | PRN
  Administered 2014-12-30: 15 mL via ORAL
  Filled 2014-12-28: qty 15

## 2014-12-28 MED ORDER — STERILE WATER FOR INJECTION IJ SOLN
INTRAMUSCULAR | Status: AC
  Filled 2014-12-28: qty 10

## 2014-12-28 MED ORDER — PHENYLEPHRINE HCL 10 MG/ML IJ SOLN
10.0000 mg | INTRAVENOUS | Status: DC | PRN
  Administered 2014-12-28: 20 ug/min via INTRAVENOUS

## 2014-12-28 MED ORDER — VANCOMYCIN HCL IN DEXTROSE 1-5 GM/200ML-% IV SOLN
1000.0000 mg | Freq: Two times a day (BID) | INTRAVENOUS | Status: AC
  Administered 2014-12-28 – 2014-12-29 (×2): 1000 mg via INTRAVENOUS
  Filled 2014-12-28 (×2): qty 200

## 2014-12-28 MED ORDER — MAGNESIUM SULFATE 2 GM/50ML IV SOLN
2.0000 g | Freq: Every day | INTRAVENOUS | Status: DC | PRN
  Filled 2014-12-28: qty 50

## 2014-12-28 MED ORDER — HEPARIN SODIUM (PORCINE) 1000 UNIT/ML IJ SOLN
INTRAMUSCULAR | Status: DC | PRN
Start: 2014-12-28 — End: 2014-12-28
  Administered 2014-12-28: 8000 [IU] via INTRAVENOUS
  Administered 2014-12-28: 3000 [IU] via INTRAVENOUS

## 2014-12-28 MED ORDER — FLUTICASONE PROPIONATE 50 MCG/ACT NA SUSP
2.0000 | Freq: Two times a day (BID) | NASAL | Status: DC
  Administered 2014-12-29 – 2015-01-04 (×11): 2 via NASAL
  Filled 2014-12-28 (×2): qty 16

## 2014-12-28 MED ORDER — LABETALOL HCL 5 MG/ML IV SOLN
INTRAVENOUS | Status: AC
  Filled 2014-12-28: qty 4

## 2014-12-28 MED ORDER — HYDRALAZINE HCL 20 MG/ML IJ SOLN: 5.0000 mg | INTRAMUSCULAR | Status: DC | PRN

## 2014-12-28 MED ORDER — SODIUM CHLORIDE 0.9 % IV BOLUS (SEPSIS)
1000.0000 mL | Freq: Once | INTRAVENOUS | Status: AC
  Administered 2014-12-28: 1000 mL via INTRAVENOUS

## 2014-12-28 MED ORDER — VECURONIUM BROMIDE 10 MG IV SOLR
INTRAVENOUS | Status: DC | PRN
  Administered 2014-12-28: .5 mg via INTRAVENOUS
  Administered 2014-12-28 (×2): 3 mg via INTRAVENOUS
  Administered 2014-12-28 (×2): 2 mg via INTRAVENOUS

## 2014-12-28 MED ORDER — HYDROMORPHONE HCL 1 MG/ML IJ SOLN
0.2500 mg | INTRAMUSCULAR | Status: DC | PRN
  Administered 2014-12-28 (×4): 0.5 mg via INTRAVENOUS

## 2014-12-28 MED ORDER — EPHEDRINE SULFATE 50 MG/ML IJ SOLN
INTRAMUSCULAR | Status: AC
  Filled 2014-12-28: qty 1

## 2014-12-28 MED ORDER — FENTANYL CITRATE (PF) 100 MCG/2ML IJ SOLN
INTRAMUSCULAR | Status: DC | PRN
  Administered 2014-12-28: 50 ug via INTRAVENOUS
  Administered 2014-12-28 (×2): 100 ug via INTRAVENOUS
  Administered 2014-12-28 (×2): 50 ug via INTRAVENOUS
  Administered 2014-12-28: 100 ug via INTRAVENOUS
  Administered 2014-12-28: 50 ug via INTRAVENOUS

## 2014-12-28 MED ORDER — HYDROMORPHONE HCL 1 MG/ML IJ SOLN
INTRAMUSCULAR | Status: AC
  Filled 2014-12-28: qty 2

## 2014-12-28 MED ORDER — ONDANSETRON HCL 4 MG/2ML IJ SOLN
INTRAMUSCULAR | Status: DC | PRN
  Administered 2014-12-28: 4 mg via INTRAVENOUS

## 2014-12-28 MED ORDER — PHENYLEPHRINE 40 MCG/ML (10ML) SYRINGE FOR IV PUSH (FOR BLOOD PRESSURE SUPPORT)
PREFILLED_SYRINGE | INTRAVENOUS | Status: AC
  Filled 2014-12-28: qty 10

## 2014-12-28 MED ORDER — LIDOCAINE HCL (CARDIAC) 20 MG/ML IV SOLN
INTRAVENOUS | Status: DC | PRN
  Administered 2014-12-28: 100 mg via INTRAVENOUS

## 2014-12-28 MED ORDER — PHENYLEPHRINE HCL 10 MG/ML IJ SOLN
INTRAMUSCULAR | Status: DC | PRN
  Administered 2014-12-28: 40 ug via INTRAVENOUS
  Administered 2014-12-28 (×4): 80 ug via INTRAVENOUS

## 2014-12-28 MED ORDER — HYDROMORPHONE HCL 1 MG/ML IJ SOLN
INTRAMUSCULAR | Status: AC
  Filled 2014-12-28: qty 1

## 2014-12-28 SURGICAL SUPPLY — 67 items
CANISTER SUCTION 2500CC (MISCELLANEOUS) ×3 IMPLANT
CLIP TI MEDIUM 24 (CLIP) ×3 IMPLANT
CLIP TI WIDE RED SMALL 24 (CLIP) ×3 IMPLANT
COVER MAYO STAND STRL (DRAPES) ×3 IMPLANT
ELECT BLADE 4.0 EZ CLEAN MEGAD (MISCELLANEOUS) ×3
ELECT BLADE 6.5 EXT (BLADE) ×2 IMPLANT
ELECT CAUTERY BLADE 6.4 (BLADE) ×2 IMPLANT
ELECT REM PT RETURN 9FT ADLT (ELECTROSURGICAL) ×6
ELECTRODE BLDE 4.0 EZ CLN MEGD (MISCELLANEOUS) ×1 IMPLANT
ELECTRODE REM PT RTRN 9FT ADLT (ELECTROSURGICAL) ×1 IMPLANT
FELT TEFLON 4 X1 (Mesh General) ×2 IMPLANT
GLOVE BIO SURGEON STRL SZ 6.5 (GLOVE) ×2 IMPLANT
GLOVE BIO SURGEON STRL SZ7.5 (GLOVE) ×3 IMPLANT
GLOVE BIO SURGEONS STRL SZ 6.5 (GLOVE) ×2
GLOVE BIOGEL PI IND STRL 6 (GLOVE) IMPLANT
GLOVE BIOGEL PI IND STRL 6.5 (GLOVE) IMPLANT
GLOVE BIOGEL PI IND STRL 7.0 (GLOVE) IMPLANT
GLOVE BIOGEL PI IND STRL 7.5 (GLOVE) ×2 IMPLANT
GLOVE BIOGEL PI IND STRL 8 (GLOVE) ×1 IMPLANT
GLOVE BIOGEL PI INDICATOR 6 (GLOVE) ×2
GLOVE BIOGEL PI INDICATOR 6.5 (GLOVE) ×2
GLOVE BIOGEL PI INDICATOR 7.0 (GLOVE) ×8
GLOVE BIOGEL PI INDICATOR 7.5 (GLOVE) ×4
GLOVE BIOGEL PI INDICATOR 8 (GLOVE) ×2
GLOVE ECLIPSE 7.0 STRL STRAW (GLOVE) ×2 IMPLANT
GLOVE SURG SS PI 7.0 STRL IVOR (GLOVE) ×4 IMPLANT
GLOVE SURG SS PI 7.5 STRL IVOR (GLOVE) ×5 IMPLANT
GOWN STRL REUS W/ TWL LRG LVL3 (GOWN DISPOSABLE) ×2 IMPLANT
GOWN STRL REUS W/ TWL XL LVL3 (GOWN DISPOSABLE) ×1 IMPLANT
GOWN STRL REUS W/TWL LRG LVL3 (GOWN DISPOSABLE) ×9
GOWN STRL REUS W/TWL XL LVL3 (GOWN DISPOSABLE) ×12
GRAFT HEMASHIELD 16X8MM (Vascular Products) ×3 IMPLANT
HEMOSTAT SNOW SURGICEL 2X4 (HEMOSTASIS) ×2 IMPLANT
INSERT FOGARTY 61MM (MISCELLANEOUS) ×7 IMPLANT
INSERT FOGARTY SM (MISCELLANEOUS) ×6 IMPLANT
KIT BASIN OR (CUSTOM PROCEDURE TRAY) ×3 IMPLANT
KIT ROOM TURNOVER OR (KITS) ×3 IMPLANT
LIQUID BAND (GAUZE/BANDAGES/DRESSINGS) ×6 IMPLANT
LOOP VESSEL MINI RED (MISCELLANEOUS) ×2 IMPLANT
NS IRRIG 1000ML POUR BTL (IV SOLUTION) ×8 IMPLANT
PACK AORTA (CUSTOM PROCEDURE TRAY) ×3 IMPLANT
PAD ARMBOARD 7.5X6 YLW CONV (MISCELLANEOUS) ×6 IMPLANT
PENCIL BUTTON HOLSTER BLD 10FT (ELECTRODE) ×2 IMPLANT
SUT ETHIBOND 5 LR DA (SUTURE) IMPLANT
SUT PDS AB 1 TP1 54 (SUTURE) ×6 IMPLANT
SUT PROLENE 3 0 SH 48 (SUTURE) ×12 IMPLANT
SUT PROLENE 3 0 SH1 36 (SUTURE) ×4 IMPLANT
SUT PROLENE 5 0 C 1 24 (SUTURE) ×4 IMPLANT
SUT PROLENE 5 0 C 1 36 (SUTURE) IMPLANT
SUT PROLENE 6 0 BV (SUTURE) ×2 IMPLANT
SUT SILK 2 0 (SUTURE) ×3
SUT SILK 2 0 TIES 17X18 (SUTURE) ×3
SUT SILK 2 0SH CR/8 30 (SUTURE) ×3 IMPLANT
SUT SILK 2-0 18XBRD TIE 12 (SUTURE) ×1 IMPLANT
SUT SILK 2-0 18XBRD TIE BLK (SUTURE) ×1 IMPLANT
SUT SILK 3 0 (SUTURE) ×3
SUT SILK 3 0 TIES 17X18 (SUTURE) ×3
SUT SILK 3-0 18XBRD TIE 12 (SUTURE) ×1 IMPLANT
SUT SILK 3-0 18XBRD TIE BLK (SUTURE) ×1 IMPLANT
SUT VIC AB 2-0 CT1 27 (SUTURE) ×18
SUT VIC AB 2-0 CT1 TAPERPNT 27 (SUTURE) ×6 IMPLANT
SUT VIC AB 3-0 SH 27 (SUTURE) ×12
SUT VIC AB 3-0 SH 27X BRD (SUTURE) ×4 IMPLANT
SUT VICRYL 4-0 PS2 18IN ABS (SUTURE) ×12 IMPLANT
TOWEL BLUE STERILE X RAY DET (MISCELLANEOUS) ×6 IMPLANT
TRAY FOLEY W/METER SILVER 16FR (SET/KITS/TRAYS/PACK) ×3 IMPLANT
WATER STERILE IRR 1000ML POUR (IV SOLUTION) ×6 IMPLANT

## 2014-12-28 NOTE — Anesthesia Preprocedure Evaluation (Addendum)
Anesthesia Evaluation  Patient identified by MRN, date of birth, ID band Patient awake  General Assessment Comment:.  Hypertension     .  Iron deficiency     .  History of bladder stone     .  Bladder stone     .  Right ureteral stone     .  Enlarged prostate     .  Frequency of urination     .  Nocturia     .  Melanoma         benign removed from back of left ear    .  Right arm fracture     Reviewed: Allergy & Precautions, H&P , NPO status , Patient's Chart, lab work & pertinent test results  History of Anesthesia Complications Negative for: history of anesthetic complications  Airway Mallampati: II  TM Distance: >3 FB Neck ROM: Full    Dental  (+) Edentulous Upper, Edentulous Lower   Pulmonary Current Smoker,  breath sounds clear to auscultation  Pulmonary exam normal       Cardiovascular Exercise Tolerance: Good hypertension, Pt. on medications + Peripheral Vascular Disease Normal cardiovascular examRhythm:Regular Rate:Normal     Neuro/Psych negative neurological ROS  negative psych ROS   GI/Hepatic negative GI ROS, Neg liver ROS,   Endo/Other  negative endocrine ROS  Renal/GU Renal disease     Musculoskeletal negative musculoskeletal ROS (+)   Abdominal   Peds  Hematology negative hematology ROS (+)   Anesthesia Other Findings   Reproductive/Obstetrics                            Anesthesia Physical  Anesthesia Plan  ASA: III  Anesthesia Plan: General   Post-op Pain Management:    Induction: Intravenous  Airway Management Planned: Oral ETT  Additional Equipment: Arterial line  Intra-op Plan:   Post-operative Plan: Extubation in OR  Informed Consent: I have reviewed the patients History and Physical, chart, labs and discussed the procedure including the risks, benefits and alternatives for the proposed anesthesia with the patient or authorized representative who  has indicated his/her understanding and acceptance.   Dental advisory given  Plan Discussed with: CRNA  Anesthesia Plan Comments:         Anesthesia Quick Evaluation

## 2014-12-28 NOTE — Progress Notes (Signed)
CRITICAL VALUE ALERT  Critical value received:  ABG  Date of notification:  12/28/2014  Time of notification:  2202  Critical value read back:  Nurse who received alert:  Warden Fillers RN  MD notified (1st page):  Trula Slade  Time of first page:  1605  MD notified (2nd page):  Time of second page:  Responding MD:  Trula Slade    Time MD responded:  1610

## 2014-12-28 NOTE — Interval H&P Note (Signed)
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 After consideration of risks, benefits and other options for treatment, the patient has consented to  Procedure(s): AORTOBIFEMORAL BYPASS GRAFT (Bilateral) as a surgical intervention .  The patient's history has been reviewed, patient examined, no change in status, stable for surgery.  I have reviewed the patient's chart and labs.  Questions were answered to the patient's satisfaction.     Annamarie Major

## 2014-12-28 NOTE — Progress Notes (Signed)
CRITICAL VALUE ALERT  Critical value received:  ABG 7.277, 58.1,60, 27.1, 29, 86%, -1  Date of notification:  12/28/2014 Time of notification:  1430  Critical value read back:yes  Nurse who received alert:  Warden Fillers RN  MD notified (1st page):  Dr. Trula Slade  Time of first page:  1430  MD notified (2nd page):  Time of second page:  Responding MD:  Dr. Trula Slade  Time MD responded:  1435

## 2014-12-28 NOTE — Progress Notes (Signed)
RT placed pt on BI-PAP per a verbal order from Dr. Trula Slade. Pt to wear while sleeping and PRN. RT will continue to monitor.

## 2014-12-28 NOTE — H&P (View-Only) (Signed)
Patient name: Chad Gomez MRN: [id number] DOB: 25-Oct-1945 Sex: male     Chief Complaint  Patient presents with  . Re-evaluation    2-3 wk f/u to discuss aortobifem BP     HISTORY OF PRESENT ILLNESS: The patient is back for follow-up.  He continues to complain of left leg claudication.  He did not tolerate retiled.  He recently underwent angiography which confirmed left external iliac occlusion.  He wants to proceed with surgery.  The patient has been cleared from a cardiology standpoint with a low risk stress test.  He has a long history of hypertension which is managed medically with dual medications.  His hypercholesterolemia is managed with a statin.  He has recently purchased Chantix to try to stop smoking.  Past Medical History  Diagnosis Date  . Hypertension   . Iron deficiency   . History of bladder stone   . Seasonal allergies   . BPH (benign prostatic hypertrophy)   . Right ureteral stone   . History of melanoma excision     LEFT EAR  . History of colon polyps     08/2013   pre-canerous  . Complication of anesthesia     urinary retention  . Chronic kidney disease     Past Surgical History  Procedure Laterality Date  . Cystolithalopaxy of bladder stone/ transrectal ultrasound prostate bx  08-19-2005  . Cystoscopy with litholapaxy Right 12/02/2012    Procedure: CYSTOSCOPY WITH stone extraction;  Surgeon: Malka So, MD;  Location: [facility];  Service: Urology;  Laterality: Right;  . Ureteroscopy Right 12/02/2012    Procedure: RIGHT URETEROSCOPY STONE EXTRACTION WITH  STENT PLACEMENT;  Surgeon: Malka So, MD;  Location: [facility];  Service: Urology;  Laterality: Right;  . Holmium laser application Right 12/07/735    Procedure: HOLMIUM LASER APPLICATION;  Surgeon: Malka So, MD;  Location: [facility];  Service: Urology;  Laterality: Right;  . Cystoscopy w/ retrogrades Right 12/02/2012    Procedure:  CYSTOSCOPY WITH RETROGRADE PYELOGRAM;  Surgeon: Malka So, MD;  Location: [facility];  Service: Urology;  Laterality: Right;  . Nephrolithotomy Left 01/27/2013    Procedure: NEPHROLITHOTOMY PERCUTANEOUS FIRST LOOK;  Surgeon: Malka So, MD;  Location: [facility];  Service: Urology;  Laterality: Left;  Marland Kitchen Mohs surgery  1996    left ear  . Multiple tooth extractions  1994  . Mandible fracture surgery  1995  . Inguinal hernia repair Right 1954  . Colonoscopy w/ polypectomy  09-13-2013  . Cystoscopy with litholapaxy Right 10/13/2013    Procedure: CYSTOSCOPY WITH LITHOLAPAXY, removal of urethral stone with basket;  Surgeon: Irine Seal, MD;  Location: [facility];  Service: Urology;  Laterality: Right;  . Cystoscopy w/ retrogrades Bilateral 10/13/2013    Procedure: CYSTOSCOPY WITH RETROGRADE PYELOGRAM;  Surgeon: Irine Seal, MD;  Location: [facility];  Service: Urology;  Laterality: Bilateral;  . Lithotripsy  2007 & 02/2013  . Cystoscopy with retrograde pyelogram, ureteroscopy and stent placement Left 03/21/2014    Procedure: CYSTOSCOPY WITH RETROGRADE PYELOGRAM, AND LEFT STENT PLACEMENT;  Surgeon: Alexis Frock, MD;  Location: [facility];  Service: Urology;  Laterality: Left;  . Holmium laser application Left 10/62/6948    Procedure: HOLMIUM LASER APPLICATION;  Surgeon: Malka So, MD;  Location: [facility];  Service: Urology;  Laterality: Left;  . Cystoscopy w/ ureteral stent placement Left 04/11/2014  Procedure: CYSTOSCOPY WITH STENT REPLACEMENT;  Surgeon: Malka So, MD;  Location: [facility];  Service: Urology;  Laterality: Left;  . Cystoscopy with ureteroscopy Left 04/11/2014    Procedure: CYSTOSCOPY WITH URETEROSCOPY/ STONE EXTRACTION;  Surgeon: Malka So, MD;  Location: [facility];  Service: Urology;  Laterality: Left;  . Peripheral vascular catheterization N/A 11/01/2014    Procedure: Lower  Extremity Angiography;  Surgeon: Serafina Mitchell, MD;  Location: [facility];  Service: Cardiovascular;  Laterality: N/A;  . Abdominal aortagram  11/01/2014    Procedure: Abdominal Aortagram;  Surgeon: Serafina Mitchell, MD;  Location: [facility];  Service: Cardiovascular;;    History   Social History  . Marital Status: Married    Spouse Name: N/A  . Number of Children: 2  . Years of Education: N/A   Occupational History  . Not on file.   Social History Main Topics  . Smoking status: Current Every Day Smoker -- 2.00 packs/day for 50 years    Types: Cigarettes  . Smokeless tobacco: Never Used     Comment: PT HAS CUT DOWN TO 1PPD FROM 2 PPD LAST YR (2014)  . Alcohol Use: No  . Drug Use: No  . Sexual Activity: Not on file   Other Topics Concern  . Not on file   Social History Narrative   Patient is married with 2 children.   Patient is right handed.   Patient has some college education.   Patient does not drink caffeine.    Family History  Problem Relation Age of Onset  . Colon cancer Neg Hx   . Dementia Mother   . Hypertension Mother   . Dementia Father   . Hypertension Sister   . Stroke Mother 25    Allergies as of 12/04/2014 - Review Complete 12/04/2014  Allergen Reaction Noted  . Tape Other (See Comments) and Rash 09/02/2013  . Amoxicillin Other (See Comments) 11/26/2012  . Ibuprofen Swelling 11/26/2012  . Naproxen Swelling 11/26/2012    Current Outpatient Prescriptions on File Prior to Visit  Medication Sig Dispense Refill  . amLODipine (NORVASC) 10 MG tablet Take 10 mg by mouth every morning.     Marland Kitchen aspirin EC 81 MG tablet Take 81 mg by mouth daily.    . cholecalciferol (VITAMIN D) 1000 UNITS tablet Take 1,000 Units by mouth daily.    . cilostazol (PLETAL) 100 MG tablet Take 1 tablet (100 mg total) by mouth 2 (two) times daily before a meal. 60 tablet 11  . Cyanocobalamin (VITAMIN B-12 PO) Take 1 capsule by mouth daily.    . Ferrous Sulfate  (IRON) 325 (65 FE) MG TABS Take 1 tablet by mouth every morning.    . fluticasone (FLONASE) 50 MCG/ACT nasal spray Place 2 sprays into the nose 2 (two) times daily.    . indapamide (LOZOL) 2.5 MG tablet Take 2.5 mg by mouth daily.    Marland Kitchen loratadine (CLARITIN) 10 MG tablet Take 10 mg by mouth daily.    . potassium citrate (UROCIT-K) 10 MEQ (1080 MG) SR tablet Take 10 mEq by mouth 3 (three) times daily with meals.    . pravastatin (PRAVACHOL) 20 MG tablet Take 20 mg by mouth daily.    . varenicline (CHANTIX) 0.5 MG tablet Take 1 tablet (0.5 mg total) by mouth 2 (two) times daily. 60 tablet 3   No current facility-administered medications on file prior to visit.     REVIEW OF  SYSTEMS: Cardiovascular: No chest pain, chest pressure, palpitations, orthopnea, or dyspnea on exertion.  Left leg claudication Pulmonary: No productive cough, asthma or wheezing. Neurologic: No weakness, paresthesias, aphasia, or amaurosis. No dizziness. Hematologic: No bleeding problems or clotting disorders. Musculoskeletal: No joint pain or joint swelling. Gastrointestinal: No blood in stool or hematemesis Genitourinary: No dysuria or hematuria. Psychiatric:: No history of major depression. Integumentary: No rashes or ulcers. Constitutional: No fever or chills.  PHYSICAL EXAMINATION:   Vital signs are  Filed Vitals:   12/04/14 1127  BP: 112/65  Pulse: 103  Height: '5\' 10"'$  (1.778 m)  Weight: 183 lb 4.8 oz (83.144 kg)  SpO2: 95%   Body mass index is 26.3 kg/(m^2). General: The patient appears their stated age. HEENT:  No gross abnormalities Pulmonary:  Non labored breathing Abdomen: Soft and non-tender Musculoskeletal: There are no major deformities. Neurologic: No focal weakness or paresthesias are detected, Skin: There are no ulcer or rashes noted. Psychiatric: The patient has normal affect. Cardiovascular: There is a regular rate and rhythm without significant murmur appreciated.   Diagnostic  Studies Carotid duplex: Less than 40% stenosis bilaterally  Assessment: Occluded left external iliac artery Plan: I discussed proceeding with aortobifemoral bypass graft.  We discussed the risks and benefits including the risk of intestinal ischemia, lower extremity ischemia, sexual dysfunction, wound complications, and cardiopulmonary complications.  The patient wishes to proceed.  His operations been scheduled for Thursday, July 7.  He will need to stop taking Pletal one week prior to his operation.  He is getting carotid Doppler studies today.  He is going to try and stop smoking.  I told him to wait to start the Chantix until after the operation in case it has deleterious side effects for him.  Eldridge Abrahams, M.D. [facility] Office: [phone] Pager:  [phone]

## 2014-12-28 NOTE — Transfer of Care (Signed)
Immediate Anesthesia Transfer of Care Note  Patient: Chad Gomez  Procedure(s) Performed: Procedure(s): AORTOBIFEMORAL BYPASS GRAFT (N/A)  Patient Location: PACU  Anesthesia Type:General  Level of Consciousness: awake, alert , oriented and patient cooperative  Airway & Oxygen Therapy: Patient Spontanous Breathing and Patient connected to face mask oxygen  Post-op Assessment: Report given to RN, Post -op Vital signs reviewed and stable and Patient moving all extremities X 4  Post vital signs: Reviewed and stable  Last Vitals:  BP 165/91 HR 85 SpO2 98 RR 15  Complications: No apparent anesthesia complications

## 2014-12-28 NOTE — Anesthesia Procedure Notes (Addendum)
Procedure Name: Intubation Date/Time: 12/28/2014 7:48 AM Performed by: Willeen Cass P Pre-anesthesia Checklist: Patient identified, Emergency Drugs available, Patient being monitored, Suction available and Timeout performed Patient Re-evaluated:Patient Re-evaluated prior to inductionOxygen Delivery Method: Circle system utilized Preoxygenation: Pre-oxygenation with 100% oxygen Intubation Type: IV induction Ventilation: Mask ventilation without difficulty and Oral airway inserted - appropriate to patient size Laryngoscope Size: Mac and 3 Grade View: Grade I Tube type: Oral Tube size: 7.5 mm Number of attempts: 1 Airway Equipment and Method: Stylet and Oral airway Placement Confirmation: ETT inserted through vocal cords under direct vision,  positive ETCO2,  CO2 detector and breath sounds checked- equal and bilateral Secured at: 23 cm Tube secured with: Tape Dental Injury: Teeth and Oropharynx as per pre-operative assessment  Comments: Intubation performed by K. Fairburn, [place]

## 2014-12-28 NOTE — Progress Notes (Addendum)
  [facility] Day of Surgery Note  Subjective On bipap, says abdomen hurts. Denies pain with groins.   Objective Filed Vitals:   12/28/14 1503  BP: 163/75  Pulse: 94  Temp:   Resp: 17  Sp02 100% Fi02 .40   Intake/Output Summary (Last 24 hours) at 12/28/14 1540 Last data filed at 12/28/14 1439  Gross per 24 hour  Intake   3620 ml  Output    780 ml  Net   2840 ml   General: on bipap, follows commands Abdomen: slightly distended but soft, minimal tenderness to palpation Extremities: dopplerable DP and PT bilaterally    Assessment/Planning: 69 y.o. male is s/p: aortobifemoral bypass Day of Surgery   ABG indicating respiratory acidosis. Placed on bipap. Closely monitor.  BP stable Good doppler flow to feet bilaterally  [name] 12/28/2014 3:40 PM --     Agree with above.  Respiratory acidosis, continue Bipap.  Foot well perfused.  Will start Toradol '30mg'$  IV q6hours for 3 days tomorrow if creatinine stable [place] Shayne Diguglielmo

## 2014-12-29 ENCOUNTER — Inpatient Hospital Stay (HOSPITAL_COMMUNITY): Payer: BLUE CROSS/BLUE SHIELD

## 2014-12-29 ENCOUNTER — Encounter (HOSPITAL_COMMUNITY): Payer: Self-pay | Admitting: *Deleted

## 2014-12-29 LAB — BLOOD GAS, ARTERIAL
Acid-Base Excess: 2.2 mmol/L — ABNORMAL HIGH (ref 0.0–2.0)
Bicarbonate: 26.6 mEq/L — ABNORMAL HIGH (ref 20.0–24.0)
DRAWN BY: 418751
FIO2: 0.4 %
O2 Saturation: 96 %
PATIENT TEMPERATURE: 98.6
PH ART: 7.396 (ref 7.350–7.450)
TCO2: 28 mmol/L (ref 0–100)
pCO2 arterial: 44.3 mmHg (ref 35.0–45.0)
pO2, Arterial: 81.4 mmHg (ref 80.0–100.0)

## 2014-12-29 LAB — CBC
HCT: 43.8 % (ref 39.0–52.0)
HEMOGLOBIN: 14.7 g/dL (ref 13.0–17.0)
MCH: 30.5 pg (ref 26.0–34.0)
MCHC: 33.6 g/dL (ref 30.0–36.0)
MCV: 90.9 fL (ref 78.0–100.0)
Platelets: 163 10*3/uL (ref 150–400)
RBC: 4.82 MIL/uL (ref 4.22–5.81)
RDW: 13 % (ref 11.5–15.5)
WBC: 11.9 10*3/uL — ABNORMAL HIGH (ref 4.0–10.5)

## 2014-12-29 LAB — POCT I-STAT 3, ART BLOOD GAS (G3+)
BICARBONATE: 25.4 meq/L — AB (ref 20.0–24.0)
O2 Saturation: 95 %
PO2 ART: 88 mmHg (ref 80.0–100.0)
Patient temperature: 38.4
TCO2: 27 mmol/L (ref 0–100)
pCO2 arterial: 47.6 mmHg — ABNORMAL HIGH (ref 35.0–45.0)
pH, Arterial: 7.342 — ABNORMAL LOW (ref 7.350–7.450)

## 2014-12-29 LAB — COMPREHENSIVE METABOLIC PANEL
ALT: 13 U/L — AB (ref 17–63)
AST: 19 U/L (ref 15–41)
Albumin: 3.2 g/dL — ABNORMAL LOW (ref 3.5–5.0)
Alkaline Phosphatase: 57 U/L (ref 38–126)
Anion gap: 6 (ref 5–15)
BUN: 16 mg/dL (ref 6–20)
CALCIUM: 8.6 mg/dL — AB (ref 8.9–10.3)
CHLORIDE: 101 mmol/L (ref 101–111)
CO2: 27 mmol/L (ref 22–32)
Creatinine, Ser: 1.05 mg/dL (ref 0.61–1.24)
Glucose, Bld: 168 mg/dL — ABNORMAL HIGH (ref 65–99)
POTASSIUM: 3.9 mmol/L (ref 3.5–5.1)
SODIUM: 134 mmol/L — AB (ref 135–145)
TOTAL PROTEIN: 5.4 g/dL — AB (ref 6.5–8.1)
Total Bilirubin: 0.9 mg/dL (ref 0.3–1.2)

## 2014-12-29 LAB — MAGNESIUM: MAGNESIUM: 1.7 mg/dL (ref 1.7–2.4)

## 2014-12-29 LAB — AMYLASE: Amylase: 69 U/L (ref 28–100)

## 2014-12-29 MED ORDER — DIPHENHYDRAMINE HCL 50 MG/ML IJ SOLN: 12.5000 mg | Freq: Four times a day (QID) | INTRAMUSCULAR | Status: DC | PRN

## 2014-12-29 MED ORDER — MORPHINE SULFATE 1 MG/ML IV SOLN
INTRAVENOUS | Status: DC
  Administered 2014-12-29: 3 mg via INTRAVENOUS
  Administered 2014-12-29: 09:00:00 via INTRAVENOUS
  Administered 2014-12-29: 1.5 mg via INTRAVENOUS
  Administered 2014-12-29: 13.5 mg via INTRAVENOUS
  Administered 2014-12-30: 4.5 mg via INTRAVENOUS
  Administered 2014-12-30: 7.5 mg via INTRAVENOUS
  Administered 2014-12-30: 10 mg via INTRAVENOUS
  Administered 2014-12-30: 9 mg via INTRAVENOUS
  Administered 2014-12-30: 16:00:00 via INTRAVENOUS
  Administered 2014-12-30: 9 mg via INTRAVENOUS
  Administered 2014-12-30: 12 mg via INTRAVENOUS
  Administered 2014-12-31: 4.5 mg via INTRAVENOUS
  Administered 2014-12-31: 5.3 mg via INTRAVENOUS
  Administered 2014-12-31: 3 mg via INTRAVENOUS
  Administered 2014-12-31: 14 mg via INTRAVENOUS
  Administered 2014-12-31: 3 mg via INTRAVENOUS
  Administered 2014-12-31: 05:00:00 via INTRAVENOUS
  Filled 2014-12-29 (×4): qty 25

## 2014-12-29 MED ORDER — SODIUM CHLORIDE 0.9 % IJ SOLN
9.0000 mL | INTRAMUSCULAR | Status: DC | PRN
  Administered 2014-12-30 (×2): 10 mL via INTRAVENOUS
  Administered 2014-12-31: 9 mL via INTRAVENOUS
  Filled 2014-12-29 (×3): qty 9

## 2014-12-29 MED ORDER — ONDANSETRON HCL 4 MG/2ML IJ SOLN: 4.0000 mg | Freq: Four times a day (QID) | INTRAMUSCULAR | Status: DC | PRN

## 2014-12-29 MED ORDER — NALOXONE HCL 0.4 MG/ML IJ SOLN: 0.4000 mg | INTRAMUSCULAR | Status: DC | PRN

## 2014-12-29 MED ORDER — DIPHENHYDRAMINE HCL 12.5 MG/5ML PO ELIX
12.5000 mg | ORAL_SOLUTION | Freq: Four times a day (QID) | ORAL | Status: DC | PRN
  Filled 2014-12-29: qty 5

## 2014-12-29 MED ORDER — LORAZEPAM 2 MG/ML IJ SOLN
1.0000 mg | Freq: Four times a day (QID) | INTRAMUSCULAR | Status: DC | PRN
  Administered 2014-12-29 – 2014-12-31 (×3): 1 mg via INTRAVENOUS
  Filled 2014-12-29 (×3): qty 1

## 2014-12-29 MED FILL — Heparin Sodium (Porcine) Inj 1000 Unit/ML: INTRAMUSCULAR | Qty: 30 | Status: AC

## 2014-12-29 MED FILL — Sodium Chloride IV Soln 0.9%: INTRAVENOUS | Qty: 1000 | Status: AC

## 2014-12-29 NOTE — Progress Notes (Addendum)
[facility] Progress Note  Subjective: Having severe abdominal pain. Denies nausea. Not passing flatus.   Tmax 98.5 BP 90s-160s 02 98% 6L  Objective Filed Vitals:   12/29/14 0700  BP: 140/87  Pulse: 98  Temp:   Resp: 22    Intake/Output Summary (Last 24 hours) at 12/29/14 0731 Last data filed at 12/29/14 0700  Gross per 24 hour  Intake   5385 ml  Output   2290 ml  Net   3095 ml   General: resting in bed in NAD Lungs: clear anterior CV: slightly tachycardic Abdomen: diffusely tender to palpation, mildly distended but soft, absent bowel sounds, no NG output Incisions: midline abd and b/l groins clean and intact with peri-incisional erythema (likely reactive to skin glue) Extremities: palpable pedal pulses bilaterally   Assessment/Planning: 69 y.o. male is s/p: aortobifemoral bypass 1 Day Post-Op   Pulm: respiratory acidosis improving, now off bipap CV: BP stable, prn antihypertensives GI: minimal output from NG tube, keep one more day, NPO Renal: UOP good, creatinine stable Heme: H/H stable Neuro: exam intact, ativan prn for agitation FEN: continue maintenance fluids DVT prophylaxis: lovenox  Keep in ICU today. Morphine PCA pump for pain.    [name] 12/29/2014 7:31 AM --  Laboratory CBC    Component Value Date/Time   WBC 11.9* 12/29/2014 0440   HGB 14.7 12/29/2014 0440   HCT 43.8 12/29/2014 0440   PLT 163 12/29/2014 0440    BMET    Component Value Date/Time   NA 134* 12/29/2014 0440   K 3.9 12/29/2014 0440   CL 101 12/29/2014 0440   CO2 27 12/29/2014 0440   GLUCOSE 168* 12/29/2014 0440   BUN 16 12/29/2014 0440   CREATININE 1.05 12/29/2014 0440   CALCIUM 8.6* 12/29/2014 0440   GFRNONAA >60 12/29/2014 0440   GFRAA >60 12/29/2014 0440    COAG Lab Results  Component Value Date   INR 1.16 12/28/2014   INR 1.17 12/15/2014   INR 1.08 01/26/2013   No results found for: PTT  Antibiotics Anti-infectives    Start      Dose/Rate Route Frequency Ordered Stop   12/28/14 2000  vancomycin (VANCOCIN) IVPB 1000 mg/200 mL premix     1,000 mg 200 mL/hr over 60 Minutes Intravenous Every 12 hours 12/28/14 1350 12/29/14 1959   12/28/14 0645  vancomycin (VANCOCIN) IVPB 1000 mg/200 mL premix     1,000 mg 200 mL/hr over 60 Minutes Intravenous To ShortStay Surgical 12/27/14 2254 12/28/14 [place], PA-C [facility] Office: [phone] Pager: [phone] 12/29/2014 7:31 AM   I agree with the above.  I have seen and evaluated the patient.  He is postoperative day #1, status post aortobifemoral bypass graft.  He did require BiPAP overnight secondary to hypercapnia.  He is complaining of significant abdominal pain today.  He has palpable pedal pulses.  Cardiovascular: Continue with antihypertensives medications and beta blocker. GI: Keep NG tube in one more day.  He will stay nothing by mouth.  On Protonix for stress ulcer prophylaxis GU: Keep Foley in place 1 more day and remove tomorrow. ID: Continue with perioperative anabiotic's. Prophylaxis: Start Lovenox today Pulmonary: Respirations are much better today.  Will likely not need BiPAP tonight. Pain: The patient has allergy to ibuprofen, so I cannot give him Toradol.  I will give him when necessary Ativan to help with muscle spasms.  We will also start a morphine PCA. Gen.:  We'll need to at least get to a chair today and ambulate tomorrow.  Annamarie Major

## 2014-12-29 NOTE — Op Note (Signed)
Patient name: Chad Gomez MRN: [id number] DOB: 08/19/1945 Sex: male  12/28/2014 Pre-operative Diagnosis: Left leg claudication Post-operative diagnosis:  Same Surgeon:  Annamarie Major Assistants:  Lennie Muckle Procedure:   Aortobifemoral bypass graft with 16 x 8 bifurcated dacryon graft Anesthesia:  Gen. Blood Loss:  See anesthesia record Specimens:  None  Findings:  End to end proximal anastomosis.  Bilateral femoral anastomosis were to the level of the profunda.  The IMA was not reimplanted as it was atretic  Indications:  The patient suffers from lifestyle any claudication in his left leg which has become intolerable.  Angiography revealed an occluded left iliac artery.  He is here today for aortobifemoral bypass graft  Procedure:  The patient was identified in the holding area and taken to [facility]  The patient was then placed supine on the table. general anesthesia was administered.  The patient was prepped and draped in the usual sterile fashion.  A time out was called and antibiotics were administered.  Longitudinal incisions were made in each groin.  Bovie cautery and sharp dissection were used to identify the femoral sheath which was opened sharply.  I then dissected out the common femoral, profundofemoral and superficial femoral arteries bilaterally.  I then divided the crossing iliac vein under the inguinal ligament.  The vessels were encircled with Silastic loops.  The groins were packed with wet lap pads and attention was turned towards the abdomen.  A midline incision was made from the xiphoid to below the umbilicus.  Cautery was used to a subtenons tissue down to the midline.  The fascia was then opened sharply in the midline and with cautery was opened throughout the length of the incision.  The abdomen was inspected.  There is no gross pathology.  A Balfour was used to open the abdomen.  An omni-Trak retractor was set up.  The transverse colon was reflected cephalad and the  small bowel was mobilized to the patient's right.  The ligament of Treitz was taken down sharply.  Retractors were used to aid with exposure.  I then dissected out the aorta.  The aorta was somewhat aneurysmal in its midportion approximately 3. 5-4 centimeters in diameter.  The mesenteric artery was encircled with a vessel loop.  I then created a tunnel to each groin, running anterior to the iliac arteries make sure to stay posterior to the ureter.  Umbilical tape was then passed.  Then dissected out the infrarenal neck of the aorta.  There was a significant posterior plaque at this level.  Once adequate exposure was obtained, the patient was given systemic heparinization.  Next, a Zanger clamp was used to occlude the aorta just above the bifurcation and a Harken clamp was placed to occlude the aorta for level of the renal arteries.  A #11 blade was used to open the aorta followed by scissors.  The aorta was then transected.  I performed endarterectomy of the distal aorta and then ligated this with 3-0 Prolene in 2 layers.  I also had perform an endarterectomy in the remaining proximal aorta as there was significant posterior plaque.  I selected a 16 x 8 bifurcated dacryon graft.  I placed 3 horizontal mattress 3-0 Prolene sutures in the back wall, incorporating a felt strip.  The lateral 2 sutures were then run anteriorly, meeting in the middle incorporating a felt strip.  Once the proximal anastomosis was completed, clamp was released.  Each limb of the graft was then brought  through the respective tunnel to the groin.  I made sure that there were no kinks within the graft.  I performed the right femoral anastomosis first.  The femoral arteries were occluded with clamps and #11 blade was used to make an arteriotomy.  Potts scissors were used to open the femoral artery proximally and distally, down to the profunda origin.  The graft was cut to the appropriate length and then spatulated to fit the size of the  arteriotomy running anastomosis was created with 5-0 Prolene.  After the appropriate flushing maneuvers were performed, bloodflow was reestablish the right leg.  Next, the left femoral vessels were occluded with clamps.  An 11 blade was used to make an arteriotomy and extended with Potts scissors in a longitudinal direction.  The arteriotomy went down to the origin of the profunda.  The graft was cut to the appropriate length and then spatulated to fit the size of the arteriotomy.  A running anastomosis created with 5-0 Prolene.  Prior to completion, the appropriate flushing maneuvers were performed the anastomosis was completed.  Hand-held Doppler was used to evaluate the signals in the superficial femoral profunda femoral artery bilaterally.  There were excellent signals.  I then turned my attention towards the abdomen again.  I evaluated the inferior mesenteric artery.  It appeared very atretic with poor Doppler signal.  I elected not to reimplant this.  I major that there was a good signal within the wall of the bowel.  50 mg of protamine was given.  Once I was satisfied with hemostasis, the retroperitoneum was reapproximated with 20 Vicodin.  The small bowel was then placed back into its anatomic position after being inspected and found to be without pathology.  The omentum and colon were placed.  The fascia was closed with running #1 PDS suture 2.  Subtenons tissue was closed with 2 Vicryls followed by 4 Vicryls and skin.  The groin wounds were then irrigated.  Hemostasis was satisfactory.  Her closed with multiple layers of 2-0 and 3-0 Vicryl followed by 4-0 Vicryl and the skin and Dermabond. the patient was successfully awakened in the operating room and taken to the recovery room in stable condition.     Disposition:  To PACU in stable condition.   Theotis Burrow, M.D. [facility] Office: [phone] Pager:  [phone]

## 2014-12-29 NOTE — Anesthesia Postprocedure Evaluation (Signed)
Anesthesia Post Note  Patient: Chad Gomez  Procedure(s) Performed: Procedure(s) (LRB): AORTOBIFEMORAL BYPASS GRAFT (N/A)  Anesthesia type: General  Patient location: PACU  Post pain: Pain level controlled  Post assessment: Post-op Vital signs reviewed  Last Vitals: BP 119/72 mmHg  Pulse 100  Temp(Src) 36.7 C (Oral)  Resp 24  Ht '5\' 10"'$  (1.778 m)  Wt 184 lb (83.462 kg)  BMI 26.40 kg/m2  SpO2 96%  Post vital signs: Reviewed  Level of consciousness: sedated  Complications: No apparent anesthesia complications

## 2014-12-29 NOTE — Progress Notes (Signed)
PT Cancellation Note  Patient Details Name: Chad Gomez MRN: [id number] DOB: 1946-03-31   Cancelled Treatment:    Reason Eval/Treat Not Completed: Pain limiting ability to participate. Spoke with RN and PCA just beginning with pain not yet controlled. Will attempt again later today.   [name] 12/29/2014, 8:56 AM  Pager [phone]

## 2014-12-29 NOTE — Evaluation (Signed)
Occupational Therapy Evaluation Patient Details Name: Chad Gomez MRN: [id number] DOB: September 02, 1945 Today's Date: 12/29/2014    History of Present Illness s/p aortobifemoral bypass graft 12/28/14 PMHx- HTN, CKD, melanoma (Lt ear)   Clinical Impression   Pt was independent prior to admission.  Pt requiring assist for bathing and dressing due to abdominal pain and expected generalized weakness following above surgery.  Expect pt will progress for home discharge with his supportive wife.  Will follow acutely.    Follow Up Recommendations  No OT follow up    Equipment Recommendations  3 in 1 bedside comode    Recommendations for Other Services       Precautions / Restrictions Precautions Precautions: Fall      Mobility Bed Mobility Overal bed mobility: Needs Assistance Bed Mobility: Sit to Sidelying;Rolling Rolling: Supervision   Supine to sit: Max assist;+2 for physical assistance;HOB elevated;+2 for safety/equipment   Sit to sidelying: Mod assist General bed mobility comments: assisted for LEs up in bed, instructed in log roll technique to minimize abdominal pain  Transfers Overall transfer level: Needs assistance Equipment used: 2 person hand held assist Transfers: Sit to/from Stand;Stand Pivot Transfers Sit to Stand: +2 physical assistance;Min assist Stand pivot transfers: Min assist;+2 safety/equipment       General transfer comment: returned to bed following bath    Balance Overall balance assessment: Needs assistance Sitting-balance support: Feet supported Sitting balance-Leahy Scale: Fair       Standing balance-Leahy Scale: Fair Standing balance comment: able to stand without UE support with supervision while pericare performed                            ADL Overall ADL's : Needs assistance/impaired Eating/Feeding: NPO   Grooming: Wash/dry face;Set up;Sitting   Upper Body Bathing: Maximal assistance;Sitting   Lower Body Bathing:  Total assistance;Sit to/from stand   Upper Body Dressing : Minimal assistance;Sitting   Lower Body Dressing: Total assistance;Sit to/from stand   Toilet Transfer: +2 for physical assistance;Minimal assistance;Stand-pivot Toilet Transfer Details (indicate cue type and reason): simulated chair to bed Toileting- Clothing Manipulation and Hygiene: Total assistance;Sit to/from stand         General ADL Comments: Pt limited by pain.     Vision     Perception     Praxis      Pertinent Vitals/Pain Pain Assessment: Faces Pain Score: 5  Faces Pain Scale: Hurts even more Pain Location: abdomen Pain Descriptors / Indicators: Guarding;Grimacing Pain Intervention(s): Limited activity within patient's tolerance;Monitored during session;Repositioned;PCA encouraged     Hand Dominance Right   Extremity/Trunk Assessment Upper Extremity Assessment Upper Extremity Assessment: Overall WFL for tasks assessed   Lower Extremity Assessment Lower Extremity Assessment: Overall WFL for tasks assessed   Cervical / Trunk Assessment Cervical / Trunk Assessment: Other exceptions Cervical / Trunk Exceptions: painful abd s/p surgery   Communication Communication Communication: No difficulties   Cognition Arousal/Alertness: Awake/alert Behavior During Therapy: WFL for tasks assessed/performed Overall Cognitive Status: Within Functional Limits for tasks assessed                     General Comments       Exercises Exercises: General Lower Extremity     Shoulder Instructions      Home Living Family/patient expects to be discharged to:: Private residence Living Arrangements: Spouse/significant other Available Help at Discharge: Family;Available 24 hours/day Type of Home: House Home Access: Stairs to enter Entrance  Stairs-Number of Steps: 6 Entrance Stairs-Rails: Right;Left Home Layout: One level     Bathroom Shower/Tub: Teacher, early years/pre: Standard      Home Equipment: None          Prior Functioning/Environment Level of Independence: Independent        Comments: works at [employer]    OT Diagnosis: Generalized weakness;Acute pain   OT Problem List: Decreased activity tolerance;Impaired balance (sitting and/or standing);Decreased knowledge of use of DME or AE;Pain   OT Treatment/Interventions: Self-care/ADL training;DME and/or AE instruction;Therapeutic activities;Patient/family education    OT Goals(Current goals can be found in the care plan section) Acute Rehab OT Goals Patient Stated Goal: hurt less OT Goal Formulation: With patient Time For Goal Achievement: 01/12/15 Potential to Achieve Goals: Good ADL Goals Pt Will Perform Grooming: with supervision;standing Pt Will Perform Lower Body Bathing: with min assist;sit to/from stand Pt Will Perform Lower Body Dressing: with min assist;sit to/from stand Pt Will Transfer to Toilet: with supervision;ambulating;bedside commode (over toilet) Pt Will Perform Toileting - Clothing Manipulation and hygiene: with supervision;sit to/from stand Pt Will Perform Tub/Shower Transfer: with min guard assist;ambulating;Tub transfer;rolling walker (determine need for DME in tub) Additional ADL Goal #1: Pt will perform bed mobility with supervision in preparation for ADL.  OT Frequency: Min 2X/week   Barriers to D/C:            Co-evaluation              End of Session    Activity Tolerance: Patient limited by fatigue;Patient limited by pain Patient left: in bed;with call bell/phone within reach;with nursing/sitter in room   Time: 1310-1331 OT Time Calculation (min): 21 min Charges:  OT General Charges $OT Visit: 1 Procedure OT Evaluation $Initial OT Evaluation Tier I: 1 Procedure G-Codes:    Malka So 12/29/2014, 1:46 PM  [phone]

## 2014-12-29 NOTE — Evaluation (Signed)
Physical Therapy Evaluation Patient Details Name: Chad Gomez MRN: [id number] DOB: [birth year] Today's Date: 12/29/2014   History of Present Illness  s/p aortobifemoral bypass graft 12/28/14 PMHx- HTN, CKD, melanoma (Lt ear)  Clinical Impression  Patient is s/p above surgery resulting in functional limitations due to the deficits listed below (see PT Problem List). Pain control has been an issue post-op, and continues to be a limitiing factor. Anticipate good progress as pain decreases. Patient will benefit from skilled PT to increase their independence and safety with mobility to allow discharge to the venue listed below.       Follow Up Recommendations Home health PT;Supervision for mobility/OOB    Equipment Recommendations  Rolling walker with 5" wheels    Recommendations for Other Services OT consult     Precautions / Restrictions        Mobility  Bed Mobility Overal bed mobility: Needs Assistance;+2 for physical assistance;+ 2 for safety/equipment Bed Mobility: Supine to Sit     Supine to sit: Max assist;+2 for physical assistance;HOB elevated;+2 for safety/equipment     General bed mobility comments: pt HOB fully elevated and pivoted to sit EOB (pt able to move legs off himself, assist to raise torso)  Transfers Overall transfer level: Needs assistance Equipment used: Rolling walker (2 wheeled) Transfers: Stand Pivot Transfers   Stand pivot transfers: Min assist;+2 safety/equipment       General transfer comment: vc for safe use of RW; very small pivotal steps (~10)   Ambulation/Gait                Stairs            Wheelchair Mobility    Modified Rankin (Stroke Patients Only)       Balance Overall balance assessment: No apparent balance deficits (not formally assessed)                                           Pertinent Vitals/Pain HR 122-130 BP 140s/60s SaO2 90-100%  Pain Assessment: 0-10 Pain Score: 5  Pain  Location: abd Pain Intervention(s): Limited activity within patient's tolerance;Monitored during session;PCA encouraged;Repositioned    Home Living Family/patient expects to be discharged to:: Private residence Living Arrangements: Spouse/significant other Available Help at Discharge: Family;Available 24 hours/day Type of Home: House Home Access: Stairs to enter Entrance Stairs-Rails: Psychiatric nurse of Steps: 6 Home Layout: One level Home Equipment: None      Prior Function Level of Independence: Independent         Comments: works at [employer]        Extremity/Trunk Assessment   Upper Extremity Assessment: Defer to OT evaluation           Lower Extremity Assessment: Overall WFL for tasks assessed      Cervical / Trunk Assessment: Other exceptions  Communication   Communication: Other (comment) (difficult to understand due to FM O2)  Cognition Arousal/Alertness: Awake/alert Behavior During Therapy: WFL for tasks assessed/performed Overall Cognitive Status: Within Functional Limits for tasks assessed                      General Comments General comments (skin integrity, edema, etc.): Educated on use of pillow to splint abd with mobility and coughing; poor carryover     Exercises General Exercises - Lower Extremity Ankle Circles/Pumps: AROM;15  reps;Supine      Assessment/Plan    PT Assessment Patient needs continued PT services  PT Diagnosis Difficulty walking;Acute pain   PT Problem List Decreased range of motion;Decreased activity tolerance;Decreased mobility;Decreased knowledge of use of DME;Pain  PT Treatment Interventions DME instruction;Gait training;Stair training;Functional mobility training;Therapeutic activities;Therapeutic exercise;Patient/family education   PT Goals (Current goals can be found in the Care Plan section) Acute Rehab PT Goals Patient Stated Goal: hurt less PT Goal  Formulation: With patient Time For Goal Achievement: 01/05/15 Potential to Achieve Goals: Good    Frequency Min 3X/week   Barriers to discharge        Co-evaluation               End of Session Equipment Utilized During Treatment: Gait belt;Oxygen Activity Tolerance: Patient limited by pain Patient left: in chair;with call bell/phone within reach Nurse Communication: Mobility status         Time: 1610-9604 PT Time Calculation (min) (ACUTE ONLY): 23 min   Charges:   PT Evaluation $Initial PT Evaluation Tier I: 1 Procedure PT Treatments $Therapeutic Activity: 8-22 mins   PT G Codes:        Chad Gomez 22-Jan-2015, 11:38 AM  Pager [phone]

## 2014-12-30 LAB — CBC
HEMATOCRIT: 39.9 % (ref 39.0–52.0)
HEMOGLOBIN: 13.5 g/dL (ref 13.0–17.0)
MCH: 30.5 pg (ref 26.0–34.0)
MCHC: 33.8 g/dL (ref 30.0–36.0)
MCV: 90.3 fL (ref 78.0–100.0)
Platelets: 136 10*3/uL — ABNORMAL LOW (ref 150–400)
RBC: 4.42 MIL/uL (ref 4.22–5.81)
RDW: 13 % (ref 11.5–15.5)
WBC: 12.9 10*3/uL — AB (ref 4.0–10.5)

## 2014-12-30 LAB — TYPE AND SCREEN
ABO/RH(D): O POS
Antibody Screen: NEGATIVE
UNIT DIVISION: 0
Unit division: 0
Unit division: 0
Unit division: 0

## 2014-12-30 LAB — BASIC METABOLIC PANEL
Anion gap: 6 (ref 5–15)
BUN: 14 mg/dL (ref 6–20)
CO2: 27 mmol/L (ref 22–32)
Calcium: 8.5 mg/dL — ABNORMAL LOW (ref 8.9–10.3)
Chloride: 101 mmol/L (ref 101–111)
Creatinine, Ser: 1.06 mg/dL (ref 0.61–1.24)
GFR calc Af Amer: >60 mL/min (ref >60)
GFR calc non Af Amer: >60 mL/min (ref >60)
GLUCOSE: 133 mg/dL — AB (ref 65–99)
Potassium: 4 mmol/L (ref 3.5–5.1)
SODIUM: 134 mmol/L — AB (ref 135–145)

## 2014-12-30 NOTE — Progress Notes (Addendum)
  AAA Progress Note    12/30/2014 9:37 AM 2 Days Post-Op  Subjective:  States he feels a little better this morning  Tm 99 now afebrile HR 100's-110's.  One episode of 120's yesterday afternoon 675'Q-492'E systolic 10% 0FH2RF  Filed Vitals:   12/30/14 0800  BP: 113/68  Pulse: 111  Temp: 98 F (36.7 C)  Resp: 15    Physical Exam: Cardiac:  Regular/tachy Lungs:  Decreased BS at bases bilaterally Abdomen:  Distended; occasional bowel sounds Incisions:  Bilateral groins are c/d/i.  Laparotomy incision has mild bloody drainage mid incision, but otherwise, is clean and dry. Extremities:  Monophasic right DP and biphasic right PT, left DP/PT.  Bilateral feet are cool to touch.  CBC    Component Value Date/Time   WBC 12.9* 12/30/2014 0300   RBC 4.42 12/30/2014 0300   HGB 13.5 12/30/2014 0300   HCT 39.9 12/30/2014 0300   PLT 136* 12/30/2014 0300   MCV 90.3 12/30/2014 0300   MCH 30.5 12/30/2014 0300   MCHC 33.8 12/30/2014 0300   RDW 13.0 12/30/2014 0300   LYMPHSABS 1.5 01/26/2013 1025   MONOABS 0.6 01/26/2013 1025   EOSABS 0.2 01/26/2013 1025   BASOSABS 0.1 01/26/2013 1025    BMET    Component Value Date/Time   NA 134* 12/30/2014 0300   K 4.0 12/30/2014 0300   CL 101 12/30/2014 0300   CO2 27 12/30/2014 0300   GLUCOSE 133* 12/30/2014 0300   BUN 14 12/30/2014 0300   CREATININE 1.06 12/30/2014 0300   CALCIUM 8.5* 12/30/2014 0300   GFRNONAA >60 12/30/2014 0300   GFRAA >60 12/30/2014 0300    INR    Component Value Date/Time   INR 1.16 12/28/2014 1344     Intake/Output Summary (Last 24 hours) at 12/30/14 7588 Last data filed at 12/30/14 0800  Gross per 24 hour  Intake   2420 ml  Output   2200 ml  Net    220 ml     Assessment/Plan:  69 y.o. male is s/p  Aortobifemoral bypass graft 2 Days Post-Op  -pt with doppler signals in feet bilaterally -pt off venturi mask and now on 6LO2NC -continue IS -abdomen with occasional bowel sounds, but no flatus  yet -mild bloody drainage mid incision, otherwise all incisions look good -minimal NGT output-will d/c, but continue strict npo -DVT prophylaxis-Lovenox -needs to mobilize more today -d/c foley -pain better controlled with PCA   Leontine Locket, PA-C [facility] [phone] 12/30/2014 9:37 AM  Still taking small breaths not very mobile secondary to pain but improved somewhat 2+ left PT pulse, right foot doppler tachycardia secondary to pain Continue PCA D/c NG and foley Emphasized moving around and [place], MD [facility] [place] Office: [phone] Pager: [phone]

## 2014-12-31 LAB — URINALYSIS, ROUTINE W REFLEX MICROSCOPIC
BILIRUBIN URINE: NEGATIVE
GLUCOSE, UA: NEGATIVE mg/dL
HGB URINE DIPSTICK: NEGATIVE
KETONES UR: NEGATIVE mg/dL
Nitrite: NEGATIVE
PROTEIN: 30 mg/dL — AB
SPECIFIC GRAVITY, URINE: 1.014 (ref 1.005–1.030)
UROBILINOGEN UA: 0.2 mg/dL (ref 0.0–1.0)
pH: 6.5 (ref 5.0–8.0)

## 2014-12-31 LAB — BASIC METABOLIC PANEL
Anion gap: 5 (ref 5–15)
BUN: 14 mg/dL (ref 6–20)
CO2: 26 mmol/L (ref 22–32)
CREATININE: 1.07 mg/dL (ref 0.61–1.24)
Calcium: 8.5 mg/dL — ABNORMAL LOW (ref 8.9–10.3)
Chloride: 101 mmol/L (ref 101–111)
GFR calc Af Amer: >60 mL/min (ref >60)
GFR calc non Af Amer: >60 mL/min (ref >60)
Glucose, Bld: 135 mg/dL — ABNORMAL HIGH (ref 65–99)
Potassium: 4.1 mmol/L (ref 3.5–5.1)
Sodium: 132 mmol/L — ABNORMAL LOW (ref 135–145)

## 2014-12-31 LAB — CBC
HEMATOCRIT: 37.8 % — AB (ref 39.0–52.0)
Hemoglobin: 12.9 g/dL — ABNORMAL LOW (ref 13.0–17.0)
MCH: 30.9 pg (ref 26.0–34.0)
MCHC: 34.1 g/dL (ref 30.0–36.0)
MCV: 90.6 fL (ref 78.0–100.0)
PLATELETS: 129 10*3/uL — AB (ref 150–400)
RBC: 4.17 MIL/uL — AB (ref 4.22–5.81)
RDW: 12.9 % (ref 11.5–15.5)
WBC: 11.9 10*3/uL — ABNORMAL HIGH (ref 4.0–10.5)

## 2014-12-31 LAB — URINE MICROSCOPIC-ADD ON

## 2014-12-31 MED ORDER — SODIUM CHLORIDE 0.9 % IV SOLN
INTRAVENOUS | Status: DC
  Administered 2014-12-31: 10:00:00 via INTRAVENOUS

## 2014-12-31 MED ORDER — MORPHINE SULFATE 1 MG/ML IV SOLN
INTRAVENOUS | Status: DC
  Administered 2014-12-31: 1.5 mg via INTRAVENOUS
  Administered 2015-01-01: 7 mg via INTRAVENOUS
  Administered 2015-01-01: 4 mg via INTRAVENOUS
  Filled 2014-12-31: qty 25

## 2014-12-31 NOTE — Plan of Care (Signed)
Problem: Problem: Bowel/Bladder Progression Goal: PATIENT IS CONTINENT Outcome: Not Progressing Pt unaware of incontinence tonight

## 2014-12-31 NOTE — Plan of Care (Signed)
Problem: Phase II Progression Outcomes Goal: Weaning O2 to maintain Sats > 90% Outcome: Not Progressing reauires increased oxygen delivery at night    Goal: Tolerates clear liquids without nausea/vomiting Outcome: Not Progressing NG tube was removed, pt remains NPO, will likely progress soon

## 2014-12-31 NOTE — Progress Notes (Signed)
Notified Dr. Oneida Alar of increasing confusion, delusions, and impulsiveness throughout the afternoon. Patient continuing to have multiple incontinent episodes and getting out of bed multiple times an hour. UA sent this morning. Pt oriented only to self now and states he is seeing bugs and people swimming outside of his room. He remains on 55% Venti mask with sats maintaining in the mid 90s with no signs of respiratory distress. No complaints of pain with Morphine PCA. Dr. Oneida Alar gave a telephone order to change full dose morphine PCA to reduced dose morphine PCA. Pt stable throughout. Will continue to monitor mental status.   Achille Rich, RN  12/31/2014 1700

## 2014-12-31 NOTE — Progress Notes (Addendum)
[facility] [place]  Subjective  - still with pain maybe slightly better, joking some today   Objective 119/83 106 98.2 F (36.8 C) (Oral) 19 90%  Intake/Output Summary (Last 24 hours) at 12/31/14 0745 Last data filed at 12/31/14 0700  Gross per 24 hour  Intake   2430 ml  Output    675 ml  Net   1755 ml   Abdomen diffusely sore incision clean Groins no drainage Feet pink warm Chest still with congestion weak cough Some urinary incontinence last night unremarkable bladder scan  Assessment/Planning: Still with pain issues compromising pulmonary toilet, continue ambulating IS Still with minimal gut function keep NPO consider clears if passing flatus Urinalysis to rule out UTI as source of incontinence D/c aline Hyponatremia switch IVF to NS Mikal Plane 12/31/2014 7:45 AM --  Laboratory Lab Results:  Recent Labs  12/30/14 0300 12/31/14 0400  WBC 12.9* 11.9*  HGB 13.5 12.9*  HCT 39.9 37.8*  PLT 136* 129*   BMET  Recent Labs  12/30/14 0300 12/31/14 0400  NA 134* 132*  K 4.0 4.1  CL 101 101  CO2 27 26  GLUCOSE 133* 135*  BUN 14 14  CREATININE 1.06 1.07  CALCIUM 8.5* 8.5*    COAG Lab Results  Component Value Date   INR 1.16 12/28/2014   INR 1.17 12/15/2014   INR 1.08 01/26/2013   No results found for: PTT

## 2015-01-01 LAB — CBC
HEMATOCRIT: 36.9 % — AB (ref 39.0–52.0)
Hemoglobin: 12.4 g/dL — ABNORMAL LOW (ref 13.0–17.0)
MCH: 30.5 pg (ref 26.0–34.0)
MCHC: 33.6 g/dL (ref 30.0–36.0)
MCV: 90.9 fL (ref 78.0–100.0)
PLATELETS: 162 10*3/uL (ref 150–400)
RBC: 4.06 MIL/uL — AB (ref 4.22–5.81)
RDW: 12.8 % (ref 11.5–15.5)
WBC: 9.5 10*3/uL (ref 4.0–10.5)

## 2015-01-01 LAB — BASIC METABOLIC PANEL
Anion gap: 7 (ref 5–15)
BUN: 16 mg/dL (ref 6–20)
CO2: 27 mmol/L (ref 22–32)
Calcium: 8.8 mg/dL — ABNORMAL LOW (ref 8.9–10.3)
Chloride: 104 mmol/L (ref 101–111)
Creatinine, Ser: 0.99 mg/dL (ref 0.61–1.24)
GFR calc Af Amer: >60 mL/min (ref >60)
GFR calc non Af Amer: >60 mL/min (ref >60)
GLUCOSE: 91 mg/dL (ref 65–99)
POTASSIUM: 3.7 mmol/L (ref 3.5–5.1)
Sodium: 138 mmol/L (ref 135–145)

## 2015-01-01 LAB — BLOOD GAS, ARTERIAL
ACID-BASE EXCESS: 1.2 mmol/L (ref 0.0–2.0)
Bicarbonate: 25.5 mEq/L — ABNORMAL HIGH (ref 20.0–24.0)
Drawn by: 280981
FIO2: 0.45 %
O2 SAT: 95.7 %
PCO2 ART: 41.9 mmHg (ref 35.0–45.0)
PH ART: 7.402 (ref 7.350–7.450)
PO2 ART: 76.4 mmHg — AB (ref 80.0–100.0)
Patient temperature: 98.6
TCO2: 26.8 mmol/L (ref 0–100)

## 2015-01-01 MED ORDER — FUROSEMIDE 10 MG/ML IJ SOLN
20.0000 mg | Freq: Once | INTRAMUSCULAR | Status: AC
  Administered 2015-01-01: 20 mg via INTRAVENOUS
  Filled 2015-01-01: qty 2

## 2015-01-01 NOTE — Progress Notes (Signed)
Pt agitated and combative; attempting to pull leads of and stated "I want to leave AMA"; called pt wife and allowed her to speak with him.  Pt. Has calmed down and agreed to stay and get back into bed.

## 2015-01-01 NOTE — Progress Notes (Signed)
Patient reports he is hungry and wants to got to the kitchen to see what he can cook for himself. Patient with sitter at bedside, attempting to get OOB without assistance. Attempts made to reorient to time and discuss the reason to stay NPO . Patient begins to state he wants to leave. He believes it 1100 am not 2300.  I assisted patient to the window near 2S02 to make him aware of the time of day. Patient with steady gait yet remained at side with the sitter on opposite side. Patient looked outside and noted it was dark and that it was a night. Assisted patient back to room , he requested to go back to bed. Sitter still present at bedside with bed in the lowest position and bed exit alarm on.

## 2015-01-01 NOTE — Progress Notes (Signed)
Occupational Therapy Treatment Patient Details Name: Chad Gomez MRN: [id number] DOB: [birth year] Today's Date: 01/01/2015    History of present illness s/p aortobifemoral bypass graft 12/28/14, onset of confusion and hallucinations 12/31/14 PMHx- HTN, CKD, melanoma (Lt ear)   OT comments  Limited session today due to pt's confusion and restlessness. Pt with urinary incontinence requiring max assist for pericare and linen change. Per sitter, pt restless all day and requiring total assist for bathing and dressing. Positioned pt on his side with hopes of pt being able to sleep. Pt ambulated x 2 today and sat up in chair with nursing. Will continue to follow.  Follow Up Recommendations   (Will update next visit depending on progress.)    Equipment Recommendations  3 in 1 bedside comode    Recommendations for Other Services      Precautions / Restrictions Precautions Precautions: Fall       Mobility Bed Mobility Overal bed mobility: Needs Assistance Bed Mobility: Rolling Rolling: Min assist         General bed mobility comments: encouraged log rolling to minimize pain, placed pillow to back and positioned pt on his side for comfort in hopes of pt being able to take a nap  Transfers                      Balance                                   ADL Overall ADL's : Needs assistance/impaired                                       General ADL Comments: Pt in bed with leakage around condom cath.  Changed bed linen and provided pericare with assist of sitter.      Vision                     Perception     Praxis      Cognition   Behavior During Therapy: Flat affect;Restless Overall Cognitive Status: Impaired/Different from baseline Area of Impairment: Orientation;Attention;Problem solving Orientation Level: Disoriented to;Time;Situation;Place Current Attention Level: Focused Memory: Decreased short-term memory         Problem Solving: Slow processing;Decreased initiation;Requires verbal cues;Requires tactile cues      Extremity/Trunk Assessment               Exercises     Shoulder Instructions       General Comments      Pertinent Vitals/ Pain       Pain Assessment: 0-10 Pain Score: 5  Pain Location: abdomen Pain Descriptors / Indicators: Operative site guarding;Guarding;Grimacing;Sore Pain Intervention(s): Limited activity within patient's tolerance;Monitored during session;Premedicated before session;Repositioned  Home Living                                          Prior Functioning/Environment              Frequency Min 2X/week     Progress Toward Goals  OT Goals(current goals can now be found in the care plan section)  Progress towards OT goals: Not progressing toward goals - comment (increased confusion)     Plan Discharge plan  remains appropriate    Co-evaluation                 End of Session     Activity Tolerance     Patient Left in bed;with call bell/phone within reach;with nursing/sitter in room   Nurse Communication          Time: 1453-1510 OT Time Calculation (min): 17 min  Charges: OT General Charges $OT Visit: 1 Procedure OT Treatments $Self Care/Home Management : 8-22 mins  Malka So 01/01/2015, 3:45 PM [phone]

## 2015-01-01 NOTE — Progress Notes (Addendum)
  [facility] Progress Note  Subjective  - POD #4   Confused this am. Per nursing, had episodes of confusions and delusions yesterday afternoon. No flatus.   Objective Filed Vitals:   01/01/15 0900  BP: 114/78  Pulse: 109  Temp:   Resp: 16   Tmax 99.3 BP sys 80s-130s 02 98% Venturi 55%  Intake/Output Summary (Last 24 hours) at 01/01/15 1014 Last data filed at 01/01/15 0800  Gross per 24 hour  Intake    600 ml  Output   1185 ml  Net   -585 ml   General: resting in bed in NAD, A&O to situation only.  Lungs: clear to ausculation bilaterally CV: tachycardic, regular rhythm Abdomen: hypoactive bowel sounds, incision intact, soft, non tender to palpation, very mildly distended Incisions: bilateral groin incisions clean and intact. Extremities: brisk doppler flow to feet bilaterally  Assessment/Planning: 69 y.o. male is s/p: aortobifemoral bypass 4 Days Post-Op   Pulm: Still on venturi mask.  Will recheck ABG today to look for hypercapnia. Continue IS CV: tachycardic, likely secondary to pain GI: hypoactive bs, not passing flatus. Will keep NPO, start clears tomorrow if passing flatus. Stress ulcer prophylaxis ID: negative UA yesterday. No leukocytosis Renal: Scr stable, good UOP Neuro: more confused. Limit sedating meds. Will d/c PCA. Continue morphine prn.  FEN: hyponatremia resolved. Continue NS IVF.  Continue ambulation and OOB into chair.   [name] 01/01/2015 10:14 AM --  Laboratory CBC    Component Value Date/Time   WBC 9.5 01/01/2015 0314   HGB 12.4* 01/01/2015 0314   HCT 36.9* 01/01/2015 0314   PLT 162 01/01/2015 0314    BMET    Component Value Date/Time   NA 138 01/01/2015 0314   K 3.7 01/01/2015 0314   CL 104 01/01/2015 0314   CO2 27 01/01/2015 0314   GLUCOSE 91 01/01/2015 0314   BUN 16 01/01/2015 0314   CREATININE 0.99 01/01/2015 0314   CALCIUM 8.8* 01/01/2015 0314   GFRNONAA >60 01/01/2015 0314   GFRAA >60  01/01/2015 0314    COAG Lab Results  Component Value Date   INR 1.16 12/28/2014   INR 1.17 12/15/2014   INR 1.08 01/26/2013   No results found for: PTT  Antibiotics Anti-infectives    Start     Dose/Rate Route Frequency Ordered Stop   12/28/14 2000  vancomycin (VANCOCIN) IVPB 1000 mg/200 mL premix     1,000 mg 200 mL/hr over 60 Minutes Intravenous Every 12 hours 12/28/14 1350 12/29/14 0920   12/28/14 0645  vancomycin (VANCOCIN) IVPB 1000 mg/200 mL premix     1,000 mg 200 mL/hr over 60 Minutes Intravenous To ShortStay Surgical 12/27/14 2254 12/28/14 0850       Virgina Jock, PA-C [facility] Office: [phone] Pager: [phone] 01/01/2015 10:14 AM     I agree with the above.  I have seen and examined the patient.  He has acute post-operative delerium.  His pain meds are now on hold.  He answers questions appropriately, but will go off on tangents.  Will also check UA.  Does not complain of pain.    Acute post op respiratory  Insufficiency.  He does not tolerate being off O2.  Bilateral pulmonary crackles, will give dose of lasix GI:  No flatus, keep NPO  [place] Kyle Stansell

## 2015-01-01 NOTE — Progress Notes (Signed)
PT Cancellation Note  Patient Details Name: DESIREE FLEMING MRN: [id number] DOB: 07/07/1945   Cancelled Treatment:    Reason Eval/Treat Not Completed: Patient at procedure or test/unavailable.  Attempted to see patient twice today (am/pm) and both times pt had just completed mobility with nursing. Remains confused with sitter. Will follow-up 7/12   Montavius Subramaniam 01/01/2015, 1:38 PM  Pager [phone]

## 2015-01-02 MED ORDER — ACETAMINOPHEN 500 MG PO TABS
500.0000 mg | ORAL_TABLET | Freq: Four times a day (QID) | ORAL | Status: DC | PRN
  Administered 2015-01-02: 500 mg via ORAL
  Filled 2015-01-02 (×2): qty 1

## 2015-01-02 NOTE — Progress Notes (Signed)
Pt transferred to 3srm7. Vitals per frequent flow sheet.

## 2015-01-02 NOTE — Progress Notes (Signed)
  [facility] Progress Note  Subjective  - POD #5  Having minor abdominal pain when getting up. Passing flatus.   Objective Filed Vitals:   01/02/15 1000  BP: 118/64  Pulse: 87  Temp:   Resp: 19   Tmax 98.8 BP sys 100s-140s 02 98% 2L  Intake/Output Summary (Last 24 hours) at 01/02/15 1050 Last data filed at 01/02/15 0900  Gross per 24 hour  Intake    210 ml  Output    850 ml  Net   -640 ml   General: A&O x 3 rest comfortably in bed in NAD CV: RRR Pulm: decreased breath sounds left base, otherwise clear Abdomen: normoactive bowel sounds, soft nontender to palpation Incisions: midline abd and bilateral groins c/d/i Extremities: feet warm and well perfused.    Assessment/Planning: 69 y.o. male is s/p: aortobifemoral bypass 5 Days Post-Op   Respiratory status improving. Off Venturi mask and now on [place] 2L Alert and oriented today with pain meds held.  Passing flatus now, will start clear liquids.  Continue IS.  Ambulate.  Stable for transfer to 3S.   [name] 01/02/2015 10:50 AM --  Laboratory CBC    Component Value Date/Time   WBC 9.5 01/01/2015 0314   HGB 12.4* 01/01/2015 0314   HCT 36.9* 01/01/2015 0314   PLT 162 01/01/2015 0314    BMET    Component Value Date/Time   NA 138 01/01/2015 0314   K 3.7 01/01/2015 0314   CL 104 01/01/2015 0314   CO2 27 01/01/2015 0314   GLUCOSE 91 01/01/2015 0314   BUN 16 01/01/2015 0314   CREATININE 0.99 01/01/2015 0314   CALCIUM 8.8* 01/01/2015 0314   GFRNONAA >60 01/01/2015 0314   GFRAA >60 01/01/2015 0314    COAG Lab Results  Component Value Date   INR 1.16 12/28/2014   INR 1.17 12/15/2014   INR 1.08 01/26/2013   No results found for: PTT  Antibiotics Anti-infectives    Start     Dose/Rate Route Frequency Ordered Stop   12/28/14 2000  vancomycin (VANCOCIN) IVPB 1000 mg/200 mL premix     1,000 mg 200 mL/hr over 60 Minutes Intravenous Every 12 hours 12/28/14 1350 12/29/14 0920     12/28/14 0645  vancomycin (VANCOCIN) IVPB 1000 mg/200 mL premix     1,000 mg 200 mL/hr over 60 Minutes Intravenous To ShortStay Surgical 12/27/14 2254 12/28/14 0850       Virgina Jock, PA-C [facility] Office: [phone] Pager: [phone] 01/02/2015 10:50 AM     Confusion much better.  Pain controlled.  Transfer to step down   [employer]

## 2015-01-02 NOTE — Progress Notes (Signed)
Physical Therapy Treatment Patient Details Name: Chad Gomez MRN: [id number] DOB: March 16, 1946 Today's Date: 01/02/2015    History of Present Illness s/p aortobifemoral bypass graft 12/28/14, onset of confusion and hallucinations 12/31/14 PMHx- HTN, CKD, melanoma (Lt ear)    PT Comments    Pt much clearer, although decr cognition persists. Balance overall good, however pt prefers use of RW due to leg weakness/decr endurance PTA (activity had been limited by PAD).   Follow Up Recommendations  Home health PT;Supervision/Assistance - 24 hour (24 hr due to decr cognition)     Equipment Recommendations  Rolling walker with 5" wheels    Recommendations for Other Services       Precautions / Restrictions Precautions Precautions: Fall;Other (comment) (confusion)    Mobility  Bed Mobility Overal bed mobility: Needs Assistance Bed Mobility: Rolling;Sidelying to Sit Rolling: Supervision Sidelying to sit: Min guard;HOB elevated       General bed mobility comments: encouraged log rolling to minimize pain; HOB 20 with rail   Transfers Overall transfer level: Needs assistance Equipment used: Rolling walker (2 wheeled) Transfers: Sit to/from Stand Sit to Stand: Min guard         General transfer comment: vc for safe/proper use of RW  Ambulation/Gait Ambulation/Gait assistance: Min assist;+2 safety/equipment Ambulation Distance (Feet): 400 Feet Assistive device: Rolling walker (2 wheeled) Gait Pattern/deviations: Step-through pattern;Decreased stride length;Trunk flexed   Gait velocity interpretation: Below normal speed for age/gender General Gait Details: pushing RW too far ahead; can correct with cues and stand fully upright, however maintains only a few steps and reverts to flexed posture   Stairs            Wheelchair Mobility    Modified Rankin (Stroke Patients Only)       Balance Overall balance assessment: Needs assistance Sitting-balance support: No  upper extremity supported;Feet unsupported Sitting balance-Leahy Scale: Good     Standing balance support: No upper extremity supported Standing balance-Leahy Scale: Fair               High level balance activites: Backward walking;Other (comment) (mini-squats; heel raises; toe raises; overhead reaching ) High Level Balance Comments: no UE support; pt self selects slightly wide BOS; no LOB    Cognition Arousal/Alertness: Awake/alert Behavior During Therapy: WFL for tasks assessed/performed Overall Cognitive Status: Impaired/Different from baseline Area of Impairment: Orientation;Attention;Problem solving;Memory Orientation Level: Disoriented to;Time Current Attention Level: Selective Memory: Decreased short-term memory       Problem Solving: Slow processing;Requires verbal cues General Comments: oriented to year, slowly came to correct month, not day/date even with cues; aware that he doesn't recall things because of "pain button"    Exercises Other Exercises Other Exercises: see standing balance activities    General Comments General comments (skin integrity, edema, etc.): wife present      Pertinent Vitals/Pain SaO2 96% on 1L  Pain Assessment: Faces Faces Pain Scale: Hurts a little bit Pain Location: upper abd incision Pain Intervention(s): Monitored during session;Repositioned    Home Living                      Prior Function            PT Goals (current goals can now be found in the care plan section) Acute Rehab PT Goals Time For Goal Achievement: 01/05/15 Progress towards PT goals: Progressing toward goals    Frequency  Min 3X/week    PT Plan Current plan remains appropriate    Co-evaluation  End of Session Equipment Utilized During Treatment: Gait belt;Oxygen Activity Tolerance: Patient tolerated treatment well Patient left: in chair;with call bell/phone within reach;with nursing/sitter in room;with family/visitor  present     Time: 1142-1206 PT Time Calculation (min) (ACUTE ONLY): 24 min  Charges:  $Gait Training: 23-37 mins                    G Codes:      Chad Gomez 01-27-2015, 12:26 PM Pager [phone]

## 2015-01-03 LAB — CBC
HEMATOCRIT: 36 % — AB (ref 39.0–52.0)
Hemoglobin: 12.3 g/dL — ABNORMAL LOW (ref 13.0–17.0)
MCH: 30.2 pg (ref 26.0–34.0)
MCHC: 34.2 g/dL (ref 30.0–36.0)
MCV: 88.5 fL (ref 78.0–100.0)
Platelets: 217 10*3/uL (ref 150–400)
RBC: 4.07 MIL/uL — ABNORMAL LOW (ref 4.22–5.81)
RDW: 12.6 % (ref 11.5–15.5)
WBC: 8.1 10*3/uL (ref 4.0–10.5)

## 2015-01-03 LAB — BASIC METABOLIC PANEL
ANION GAP: 9 (ref 5–15)
BUN: 22 mg/dL — ABNORMAL HIGH (ref 6–20)
CALCIUM: 9.1 mg/dL (ref 8.9–10.3)
CHLORIDE: 103 mmol/L (ref 101–111)
CO2: 28 mmol/L (ref 22–32)
CREATININE: 1.12 mg/dL (ref 0.61–1.24)
GFR calc Af Amer: >60 mL/min (ref >60)
GFR calc non Af Amer: >60 mL/min (ref >60)
Glucose, Bld: 113 mg/dL — ABNORMAL HIGH (ref 65–99)
Potassium: 3.1 mmol/L — ABNORMAL LOW (ref 3.5–5.1)
Sodium: 140 mmol/L (ref 135–145)

## 2015-01-03 MED ORDER — PANTOPRAZOLE SODIUM 40 MG PO TBEC
40.0000 mg | DELAYED_RELEASE_TABLET | Freq: Every day | ORAL | Status: DC
  Administered 2015-01-03: 40 mg via ORAL
  Filled 2015-01-03 (×2): qty 1

## 2015-01-03 MED ORDER — AMLODIPINE BESYLATE 10 MG PO TABS
10.0000 mg | ORAL_TABLET | Freq: Every morning | ORAL | Status: DC
  Administered 2015-01-03 – 2015-01-04 (×2): 10 mg via ORAL
  Filled 2015-01-03 (×2): qty 1

## 2015-01-03 MED ORDER — OXYCODONE HCL 5 MG PO TABS: 5.0000 mg | ORAL_TABLET | ORAL | Status: DC | PRN

## 2015-01-03 MED ORDER — INDAPAMIDE 2.5 MG PO TABS
2.5000 mg | ORAL_TABLET | Freq: Every day | ORAL | Status: DC
  Filled 2015-01-03: qty 1

## 2015-01-03 MED ORDER — ASPIRIN EC 81 MG PO TBEC
81.0000 mg | DELAYED_RELEASE_TABLET | Freq: Every day | ORAL | Status: DC
  Administered 2015-01-03 – 2015-01-04 (×2): 81 mg via ORAL
  Filled 2015-01-03 (×2): qty 1

## 2015-01-03 MED ORDER — LORATADINE 10 MG PO TABS
10.0000 mg | ORAL_TABLET | Freq: Every day | ORAL | Status: DC
  Administered 2015-01-03 – 2015-01-04 (×2): 10 mg via ORAL
  Filled 2015-01-03 (×2): qty 1

## 2015-01-03 MED ORDER — POTASSIUM CHLORIDE CRYS ER 20 MEQ PO TBCR
20.0000 meq | EXTENDED_RELEASE_TABLET | Freq: Two times a day (BID) | ORAL | Status: AC
  Administered 2015-01-03 (×2): 20 meq via ORAL
  Filled 2015-01-03 (×2): qty 1

## 2015-01-03 MED ORDER — PRAVASTATIN SODIUM 20 MG PO TABS
20.0000 mg | ORAL_TABLET | Freq: Every day | ORAL | Status: DC
  Administered 2015-01-03: 20 mg via ORAL
  Filled 2015-01-03 (×2): qty 1

## 2015-01-03 NOTE — Care Management Note (Signed)
Case Management Note  Patient Details  Name: Chad Gomez MRN: [id number] Date of Birth: March 28, 1946  Subjective/Objective:                 From home with familys/p Aortobifemoral bypass graft  12/29/14.   Action/Plan:  Return to home  when medically stable. CM to f/u with d/c needs. Expected Discharge Date:                  Expected Discharge Plan:  [place]  In-House Referral:     Discharge planning Services     Post Acute Care Choice:    Choice offered to:     DME Arranged:    DME Agency:     HH Arranged:    [place] Agency:     Status of Service:  In process, will continue to follow  Medicare Important Message Given:    Date Medicare IM Given:    Medicare IM give by:    Date Additional Medicare IM Given:    Additional Medicare Important Message give by:     If discussed at [place] of Stay Meetings, dates discussed:    Additional Comments: Dyson Sevey (Spouse)  [phone]   Sharin Mons, RN 01/03/2015, 10:44 AM

## 2015-01-03 NOTE — Progress Notes (Addendum)
  [facility] Progress Note  Subjective  - POD #6  Feels "super." Motivated to quit smoking. Denies any pain. Had three BMs yesterday.   Objective Filed Vitals:   01/03/15 0357  BP: 118/65  Pulse: 80  Temp:   Resp: 21  02 93% RA   Intake/Output Summary (Last 24 hours) at 01/03/15 0746 Last data filed at 01/03/15 0600  Gross per 24 hour  Intake   1200 ml  Output    600 ml  Net    600 ml   Comfortably sitting in chair, alert Lungs: clear to auscultation bilaterally CV: RRR Abdomen: soft non tender, normoactive bowel sounds Incisions: midline laparotomy and bilateral groins healing well Extremities: feet warm and well perfused. Palpable left PT  Assessment/Planning: 69 y.o. male is s/p: aortobifemoral bypass 6 Days Post-Op   -Progressing well. Off [place] on RA.  -Tolerating clear liquids. Advance to full liquids. If tolerating fulls, advance to regular diet.  -Replete K+.  -Restart home meds.  -Had three BMs yesterday.  -Having minimal pain.  -Encourage IS.  -Ambulate.  -Lovenox for DVT prophylaxis.  -Stable for transfer to floor.   [name] 01/03/2015 7:46 AM --  Laboratory CBC    Component Value Date/Time   WBC 8.1 01/03/2015 0232   HGB 12.3* 01/03/2015 0232   HCT 36.0* 01/03/2015 0232   PLT 217 01/03/2015 0232    BMET    Component Value Date/Time   NA 140 01/03/2015 0232   K 3.1* 01/03/2015 0232   CL 103 01/03/2015 0232   CO2 28 01/03/2015 0232   GLUCOSE 113* 01/03/2015 0232   BUN 22* 01/03/2015 0232   CREATININE 1.12 01/03/2015 0232   CALCIUM 9.1 01/03/2015 0232   GFRNONAA >60 01/03/2015 0232   GFRAA >60 01/03/2015 0232    COAG Lab Results  Component Value Date   INR 1.16 12/28/2014   INR 1.17 12/15/2014   INR 1.08 01/26/2013   No results found for: PTT  Antibiotics Anti-infectives    Start     Dose/Rate Route Frequency Ordered Stop   12/28/14 2000  vancomycin (VANCOCIN) IVPB 1000 mg/200 mL premix     1,000  mg 200 mL/hr over 60 Minutes Intravenous Every 12 hours 12/28/14 1350 12/29/14 0920   12/28/14 0645  vancomycin (VANCOCIN) IVPB 1000 mg/200 mL premix     1,000 mg 200 mL/hr over 60 Minutes Intravenous To ShortStay Surgical 12/27/14 2254 12/28/14 0850       Virgina Jock, PA-C [facility] Office: [phone] Pager: [phone] 01/03/2015 7:46 AM     Agree with the above.  Incision looks good.  Palpable pulses.  Avance diet, transfer to floor.  Llikly discharge in 1-2 days  Chad Gomez

## 2015-01-04 LAB — BASIC METABOLIC PANEL
Anion gap: 6 (ref 5–15)
BUN: 16 mg/dL (ref 6–20)
CO2: 30 mmol/L (ref 22–32)
Calcium: 9.2 mg/dL (ref 8.9–10.3)
Chloride: 104 mmol/L (ref 101–111)
Creatinine, Ser: 1.13 mg/dL (ref 0.61–1.24)
GFR calc non Af Amer: >60 mL/min (ref >60)
GLUCOSE: 136 mg/dL — AB (ref 65–99)
Potassium: 3.2 mmol/L — ABNORMAL LOW (ref 3.5–5.1)
SODIUM: 140 mmol/L (ref 135–145)

## 2015-01-04 LAB — CBC
HCT: 37.2 % — ABNORMAL LOW (ref 39.0–52.0)
HEMOGLOBIN: 12.5 g/dL — AB (ref 13.0–17.0)
MCH: 30 pg (ref 26.0–34.0)
MCHC: 33.6 g/dL (ref 30.0–36.0)
MCV: 89.2 fL (ref 78.0–100.0)
PLATELETS: 246 10*3/uL (ref 150–400)
RBC: 4.17 MIL/uL — AB (ref 4.22–5.81)
RDW: 12.6 % (ref 11.5–15.5)
WBC: 8.4 10*3/uL (ref 4.0–10.5)

## 2015-01-04 MED ORDER — POTASSIUM CHLORIDE CRYS ER 20 MEQ PO TBCR: 20.0000 meq | EXTENDED_RELEASE_TABLET | Freq: Two times a day (BID) | ORAL | Status: DC

## 2015-01-04 MED ORDER — OXYCODONE HCL 5 MG PO TABS: 5.0000 mg | ORAL_TABLET | Freq: Four times a day (QID) | ORAL | Status: DC | PRN

## 2015-01-04 NOTE — Progress Notes (Signed)
Physical Therapy Treatment Patient Details Name: CARMINO OCAIN MRN: [id number] DOB: 06/16/1946 Today's Date: 2015-01-09    History of Present Illness s/p aortobifemoral bypass graft 12/28/14, onset of confusion and hallucinations 12/31/14 PMHx- HTN, CKD, melanoma (Lt ear)    PT Comments    Notified pt to discharge today after stair training. Cognition much improved, balance improved. Completed stairs with no difficulty. Continue to feel HHPT safety evaluation indicated with prolonged confusion.  Follow Up Recommendations  Home health PT;Supervision/Assistance - 24 hour     Equipment Recommendations  Rolling walker with 5" wheels    Recommendations for Other Services       Precautions / Restrictions Precautions Precautions: Fall;Other (comment) (confusion) Restrictions Weight Bearing Restrictions: No    Mobility  Bed Mobility Overal bed mobility: Needs Assistance;Modified Independent Bed Mobility: Rolling;Sidelying to Sit              Transfers Overall transfer level: Modified independent Equipment used: Rolling walker (2 wheeled) Transfers: Sit to/from Stand              Ambulation/Gait Ambulation/Gait assistance: Supervision Ambulation Distance (Feet): 300 Feet Assistive device: Rolling walker (2 wheeled) Gait Pattern/deviations: Step-through pattern;Decreased stride length;Trunk flexed     General Gait Details: pushing RW too far ahead; can correct with cues and stand fully upright, however maintains only a few steps and reverts to flexed posture   Stairs Stairs: Yes Stairs assistance: Supervision Stair Management: One rail Right;Forwards;Alternating pattern;Step to pattern Number of Stairs: 10 General stair comments: excellent balance, progressed to alternating pattern; wife observed  Wheelchair Mobility    Modified Rankin (Stroke Patients Only)       Balance     Sitting balance-Leahy Scale: Good       Standing balance-Leahy Scale:  Good                      Cognition Arousal/Alertness: Awake/alert Behavior During Therapy: WFL for tasks assessed/performed Overall Cognitive Status: Within Functional Limits for tasks assessed                      Exercises Other Exercises Other Exercises: Educated on walking program upon discharge (avoiding outdoors/overheating)    General Comments        Pertinent Vitals/Pain Pain Assessment: 0-10 Faces Pain Scale: Hurts a little bit Pain Location: abd Pain Intervention(s): Limited activity within patient's tolerance;Repositioned    Home Living                      Prior Function            PT Goals (current goals can now be found in the care plan section) Acute Rehab PT Goals Patient Stated Goal: hurt less Time For Goal Achievement: 01/05/15 Progress towards PT goals: Goals met/education completed, patient discharged from PT    Frequency  Min 3X/week    PT Plan Current plan remains appropriate    Co-evaluation             End of Session   Activity Tolerance: Patient tolerated treatment well Patient left: in chair;with call bell/phone within reach;with nursing/sitter in room;with family/visitor present     Time: 1145-1155 PT Time Calculation (min) (ACUTE ONLY): 10 min  Charges:  $Gait Training: 8-22 mins                    G Codes:      Khalifa Knecht January 09, 2015, 12:03 PM Pager [phone]

## 2015-01-04 NOTE — Progress Notes (Signed)
Discussed in LOS meeting.

## 2015-01-04 NOTE — Care Management Note (Addendum)
Case Management Note  Patient Details  Name: SYMEON PULEO MRN: [id number] Date of Birth: 1945-11-06  Subjective/Objective:    Pt admitted with aortoiliac occlusive disease                Action/Plan:  Pt is independent from home with wife.  Pt would benefit from HHPT.  CM will assist with HHPT initiation.   Expected Discharge Date:                  Expected Discharge Plan:  [place]  In-House Referral:     Discharge planning Services  CM Consult  Post Acute Care Choice:    Choice offered to:     DME Arranged:  Rolling Walker,3:1 DME Agency:  [facility] Arranged:  PT [place] Agency:  [place]  Status of Service:  Completed, signed off  Medicare Important Message Given:  No Date Medicare IM Given:    Medicare IM give by:    Date Additional Medicare IM Given:    Additional Medicare Important Message give by:     If discussed at [place] of Stay Meetings, dates discussed:    Additional Comments: CM assessed pt prior to discharge.  CM offered pt choice, pt chose [facility] for HHPT and recommended DME.  CM contacted agencies, referral was accepted.  CM verified home address and per pt; the home phone number listed in Epic is the best for contact, agency informed. Per pt; wife will provide 24 supervision at discharge. CM requested bedside nurse during progression rounds to obtain [facility] orders. No additional CM needs Maryclare Labrador, RN 01/04/2015, 10:43 AM

## 2015-01-04 NOTE — Discharge Summary (Signed)
AAA Discharge Summary    Chad Gomez 06/13/1946 68 y.o. male  [id number]  Admission Date: 12/28/2014  Discharge Date: 01/04/15  Physician: Serafina Mitchell, MD  Admission Diagnosis: Occluded left iliac artery I74.5   HPI:   This is a 69 y.o. male who comes in early for his three-month visit for claudication evaluation. When I last saw him he was having trouble at approximately 50 yards and his left leg. His leg gets tight and he has to stop working. He has had to stop work 3 times the last 6 weeks. His symptoms go away with rest. He was started on cilostazol. He initially did not tolerate 100 mg twice a day, secondary to abdominal cramping and diarrhea. He decreased the dose to 50 twice a day which she did tolerate. He then re-increase the dose to 100 mg and appears to be tolerating this. Despite some benefit from the medication, he still has significant limitations with his activity level and wants to discuss additional options.  The patient has a long-standing history of hypertension. With 2 medications including an ACE inhibitor his blood pressure is in the 120s now. Historically it had been in the low 200s. The patient suffers from hypercholesterolemia which is managed with a statin. He is a 2 pack per day smoker.  Hospital Course:  The patient was admitted to the hospital and taken to the operating room on 12/28/2014 and underwent: Aortobifemoral bypass grafting    The pt tolerated the procedure well and was transported to the PACU in good condition. He did have a respiratory acidosis that afternoon and was placed on bipap.  He did have audible doppler flow to his feet bilaterally.  By POD 1, he was having significant abdominal pain and his NGT was left in place.  He was on IV protonix.  Given the pt has an allergy to Ibuprofen, he was not given Toradol.  A morphine PCA was started.  By POD 2, he was off of the venturi mask and on 6LO2NC.  He was not having any  flatus, but did have occasional bowel sounds.   The remainder of the hospital course consisted of increasing mobilization and increasing intake of solids without difficulty.  By POD 3, he ws still having significant pain, which was interfering with pulmonary toilet.  He continued his incentive spirometer.  He did have hyponatremia and his IVF were switched to NS kvo.  A u/a was sent off and this was negative.  By POD 5, he was doing well and was on 2LO2NC.  He was alert and oriented.  He was passing flatus and he was started on clear liquids.  He transferred to the stepdown unit this day.  By POD6/7, he was doing well and ambulating without difficulty.  He is motivated to quit smoking as well.  He is using Tylenol for pain control and is working well.    CBC    Component Value Date/Time   WBC 8.4 01/04/2015 0500   RBC 4.17* 01/04/2015 0500   HGB 12.5* 01/04/2015 0500   HCT 37.2* 01/04/2015 0500   PLT 246 01/04/2015 0500   MCV 89.2 01/04/2015 0500   MCH 30.0 01/04/2015 0500   MCHC 33.6 01/04/2015 0500   RDW 12.6 01/04/2015 0500   LYMPHSABS 1.5 01/26/2013 1025   MONOABS 0.6 01/26/2013 1025   EOSABS 0.2 01/26/2013 1025   BASOSABS 0.1 01/26/2013 1025    BMET    Component Value Date/Time   NA 140 01/04/2015 0500  K 3.2* 01/04/2015 0500   CL 104 01/04/2015 0500   CO2 30 01/04/2015 0500   GLUCOSE 136* 01/04/2015 0500   BUN 16 01/04/2015 0500   CREATININE 1.13 01/04/2015 0500   CALCIUM 9.2 01/04/2015 0500   GFRNONAA >60 01/04/2015 0500   GFRAA >60 01/04/2015 0500     Discharge Instructions    ABDOMINAL PROCEDURE/ANEURYSM REPAIR/AORTO-BIFEMORAL BYPASS:  Call MD for increased abdominal pain; cramping diarrhea; nausea/vomiting    Complete by:  As directed      Call MD for:  redness, tenderness, or signs of infection (pain, swelling, bleeding, redness, odor or green/yellow discharge around incision site)    Complete by:  As directed      Call MD for:  severe or increased pain,  loss or decreased feeling  in affected limb(s)    Complete by:  As directed      Call MD for:  temperature >100.5    Complete by:  As directed      Discharge wound care:    Complete by:  As directed   Wash the groin wound with soap and water daily and pat dry. (No tub bath-only shower)  Then put a dry gauze or washcloth there to keep this area dry daily and as needed.  Do not use Vaseline or neosporin on your incisions.  Only use soap and water on your incisions and then protect and keep dry.     Driving Restrictions    Complete by:  As directed   No driving for 2 weeks     Lifting restrictions    Complete by:  As directed   No lifting for 4 weeks     Resume previous diet    Complete by:  As directed            Discharge Diagnosis:  Occluded left iliac artery I74.5  Secondary Diagnosis: Patient Active Problem List   Diagnosis Date Noted  . Aortoiliac occlusive disease 12/28/2014  . Preop cardiovascular exam 11/24/2014  . Acute renal insufficiency 03/22/2014  . Ureteral stone with hydronephrosis 03/21/2014  . Ureteral stone 03/21/2014  . Ulnar neuropathy 02/07/2014  . BP (high blood pressure) 02/07/2014  . Calculus of kidney 02/07/2014  . Compulsive tobacco user syndrome 02/07/2014  . Allergic rhinitis 02/07/2014  . Malignant melanoma 02/07/2014  . Right ureteral stone 10/13/2013  . Bladder stone 10/13/2013   Past Medical History  Diagnosis Date  . Hypertension   . Iron deficiency   . History of bladder stone   . Seasonal allergies   . BPH (benign prostatic hypertrophy)   . Right ureteral stone   . History of melanoma excision     LEFT EAR  . History of colon polyps     08/2013   pre-canerous  . Complication of anesthesia     urinary retention  . Chronic kidney disease   . Peripheral vascular disease   . Cough        Medication List    TAKE these medications        amLODipine 10 MG tablet  Commonly known as:  NORVASC  Take 10 mg by mouth every morning.      aspirin EC 81 MG tablet  Take 81 mg by mouth daily.     cholecalciferol 1000 UNITS tablet  Commonly known as:  VITAMIN D  Take 1,000 Units by mouth daily.     cilostazol 100 MG tablet  Commonly known as:  PLETAL  Take 1 tablet (100 mg  total) by mouth 2 (two) times daily before a meal.     CoQ10 100 MG Caps  Take 1 capsule by mouth daily.     fluticasone 50 MCG/ACT nasal spray  Commonly known as:  FLONASE  Place 2 sprays into the nose 2 (two) times daily.     indapamide 2.5 MG tablet  Commonly known as:  LOZOL  Take 2.5 mg by mouth daily.     Iron 325 (65 FE) MG Tabs  Take 1 tablet by mouth every morning.     lisinopril 20 MG tablet  Commonly known as:  PRINIVIL,ZESTRIL  Take 20 mg by mouth daily.     loratadine 10 MG tablet  Commonly known as:  CLARITIN  Take 10 mg by mouth daily.     oxyCODONE 5 MG immediate release tablet  Commonly known as:  Oxy IR/ROXICODONE  Take 1 tablet (5 mg total) by mouth every 6 (six) hours as needed for moderate pain.     potassium citrate 10 MEQ (1080 MG) SR tablet  Commonly known as:  UROCIT-K  Take 10 mEq by mouth 3 (three) times daily with meals.     pravastatin 20 MG tablet  Commonly known as:  PRAVACHOL  Take 20 mg by mouth daily.     varenicline 0.5 MG tablet  Commonly known as:  CHANTIX  Take 1 tablet (0.5 mg total) by mouth 2 (two) times daily.     VITAMIN B-12 PO  Take 1,000 mcg by mouth daily.        Prescriptions given: 1.  Roxicodone #20 No Refill  Instructions: 1.  Wash the groin wound with soap and water daily and pat dry. (No tub bath-only shower)  Then put a dry gauze or washcloth there to keep this area dry daily and as needed.  Do not use Vaseline or neosporin on your incisions.  Only use soap and water on your incisions and then protect and keep dry. 2. No driving x 2 weeks 3. No heavy lifting x 4 weeks  Disposition: home  Patient's condition: is Good  Follow up: 1. Dr. Trula Slade in 2  weeks   Leontine Locket, PA-C [facility] [phone] 01/04/2015  11:43 AM   - For VQI Registry use ---   Post-op:  Time to Extubation: '[x]'$  In OR, '[ ]'$  < 12 hrs, '[ ]'$  12-24 hrs, '[ ]'$  >=24 hrs Vasopressors Req. Post-op: No ICU Stay: 5 day in ICU  (1 day in stepdown) Transfusion: No  If yes, n/a units given MI: No, '[ ]'$  Troponin only, '[ ]'$  EKG or Clinical New Arrhythmia: No  Complications: CHF: No Resp failure: No, '[ ]'$  Pneumonia, '[ ]'$  Ventilator Chg in renal function: No, '[ ]'$  Inc. Cr > 0.5, '[ ]'$  Temp. Dialysis, '[ ]'$  Permanent dialysis Leg ischemia: No, no Surgery needed, '[ ]'$  Yes, Surgery needed, '[ ]'$  Amputation Bowel ischemia: No, '[ ]'$  Medical Rx, '[ ]'$  Surgical Rx Wound complication: No, '[ ]'$  Superficial separation/infection, '[ ]'$  Return to OR Return to OR: No  Return to OR for bleeding: No Stroke: No, '[ ]'$  Minor, '[ ]'$  Major  Discharge medications: Statin use:  Yes If No: '[ ]'$  For Medical reasons, '[ ]'$  Non-compliant ASA use:  Yes  If No: '[ ]'$  For Medical reasons, '[ ]'$  Non-compliant Plavix use:  No If No: '[ ]'$  For Medical reasons, '[ ]'$  Non-compliant Beta blocker use:  No If No: '[ ]'$  For Medical reasons, '[ ]'$  Non-compliant

## 2015-01-09 ENCOUNTER — Telehealth: Payer: Self-pay

## 2015-01-09 DIAGNOSIS — G8918 Other acute postprocedural pain: Secondary | ICD-10-CM

## 2015-01-09 MED ORDER — OXYCODONE HCL 5 MG PO TABS: 5.0000 mg | ORAL_TABLET | Freq: Four times a day (QID) | ORAL | Status: DC | PRN

## 2015-01-09 NOTE — Telephone Encounter (Signed)
Phone call from pt's wife.  Reported the pt. Has not had a BM since he was discharged on 01/04/15.  Reported he is passing gas.  Denied any abdominal bloating, or nausea/ vomiting.  Reported he is walking around the house @ intervals to maintain mobility, since it is too hot to walk outside.  Stated pt. is drinking fluids well; reported 120 oz / day.  Stated he isn't eating a lot ; reported drinking protein shakes, and also eating protein bars.  Denied fever/ chills.  Stated the abdominal and groin incisions are intact.  Denied any redness.  Stated pt. has had a very small amt. of pink drainage from abdominal incision.  Asking for refill on Oxycodone 5 mg tablet.  Stated pt. Rates pain @ 8/10 when pain medication has worn off; rates pain @ 3/10 with pain medication.  Also asking about his 2 wk. F/u appt.  Advised to have pt. maintain hydration, continue walking to improve strength and circulation, and may take a stool softener daily, and/or MOM 1 tbsp x1 dose; if no BM after 6-8 hrs, then may repeat, for constipation.  Discussed Oxycodone refill request with Dr. Kellie Simmering; stated he will authorize refill of Oxycodone 5 mg. # 30; no refills. Will arrange pt. to be scheduled for a post op f/u appt.

## 2015-01-11 ENCOUNTER — Encounter: Payer: Self-pay | Admitting: Surgery

## 2015-01-12 ENCOUNTER — Encounter: Payer: Self-pay | Admitting: Surgery

## 2015-01-15 ENCOUNTER — Ambulatory Visit (INDEPENDENT_AMBULATORY_CARE_PROVIDER_SITE_OTHER): Payer: Self-pay | Admitting: Surgery

## 2015-01-15 ENCOUNTER — Encounter: Payer: Self-pay | Admitting: Surgery

## 2015-01-15 VITALS — BP 99/84 | HR 116 | Temp 97.5°F | Resp 18 | Ht 70.0 in | Wt 163.0 lb

## 2015-01-15 DIAGNOSIS — Z0279 Encounter for issue of other medical certificate: Secondary | ICD-10-CM

## 2015-01-15 DIAGNOSIS — G8918 Other acute postprocedural pain: Secondary | ICD-10-CM | POA: Insufficient documentation

## 2015-01-15 NOTE — Progress Notes (Signed)
Patient name: Chad Gomez MRN: [id number] DOB: 06-28-1945 Sex: male     Chief Complaint  Patient presents with  . Routine Post Op    2 wk f/u S/P AORTOBIFEMORAL  BP 12-28-14   C/O Abdominal soreness and pt. is not eating, food does not taste good.    HISTORY OF PRESENT ILLNESS:  The patient is back for follow-up.  He is status post aortobifemoral bypass graft using a 16 x 8 bifurcated dacryon graft on 12/29/2014.  This was done for left iliac occlusion. The patient's postoperative course was uncomplicated.  He is back today with complaints of fatigue and anorexia. His incisional pain is appropriate. He is not yet having regular bowel movements.  Past Medical History  Diagnosis Date  . Hypertension   . Iron deficiency   . History of bladder stone   . Seasonal allergies   . BPH (benign prostatic hypertrophy)   . Right ureteral stone   . History of melanoma excision     LEFT EAR  . History of colon polyps     08/2013   pre-canerous  . Complication of anesthesia     urinary retention  . Chronic kidney disease   . Peripheral vascular disease   . Cough     Past Surgical History  Procedure Laterality Date  . Cystolithalopaxy of bladder stone/ transrectal ultrasound prostate bx  08-19-2005  . Cystoscopy with litholapaxy Right 12/02/2012    Procedure: CYSTOSCOPY WITH stone extraction;  Surgeon: Malka So, MD;  Location: [facility];  Service: Urology;  Laterality: Right;  . Ureteroscopy Right 12/02/2012    Procedure: RIGHT URETEROSCOPY STONE EXTRACTION WITH  STENT PLACEMENT;  Surgeon: Malka So, MD;  Location: [facility];  Service: Urology;  Laterality: Right;  . Holmium laser application Right 7/32/2025    Procedure: HOLMIUM LASER APPLICATION;  Surgeon: Malka So, MD;  Location: [facility];  Service: Urology;  Laterality: Right;  . Cystoscopy w/ retrogrades Right 12/02/2012    Procedure: CYSTOSCOPY WITH RETROGRADE  PYELOGRAM;  Surgeon: Malka So, MD;  Location: [facility];  Service: Urology;  Laterality: Right;  . Nephrolithotomy Left 01/27/2013    Procedure: NEPHROLITHOTOMY PERCUTANEOUS FIRST LOOK;  Surgeon: Malka So, MD;  Location: [facility];  Service: Urology;  Laterality: Left;  Marland Kitchen Mohs surgery  1996    left ear  . Multiple tooth extractions  1994  . Mandible fracture surgery  1995  . Inguinal hernia repair Right 1954  . Colonoscopy w/ polypectomy  09-13-2013  . Cystoscopy with litholapaxy Right 10/13/2013    Procedure: CYSTOSCOPY WITH LITHOLAPAXY, removal of urethral stone with basket;  Surgeon: Irine Seal, MD;  Location: [facility];  Service: Urology;  Laterality: Right;  . Cystoscopy w/ retrogrades Bilateral 10/13/2013    Procedure: CYSTOSCOPY WITH RETROGRADE PYELOGRAM;  Surgeon: Irine Seal, MD;  Location: [facility];  Service: Urology;  Laterality: Bilateral;  . Lithotripsy  2007 & 02/2013  . Cystoscopy with retrograde pyelogram, ureteroscopy and stent placement Left 03/21/2014    Procedure: CYSTOSCOPY WITH RETROGRADE PYELOGRAM, AND LEFT STENT PLACEMENT;  Surgeon: Alexis Frock, MD;  Location: [facility];  Service: Urology;  Laterality: Left;  . Holmium laser application Left 42/70/6237    Procedure: HOLMIUM LASER APPLICATION;  Surgeon: Malka So, MD;  Location: [facility];  Service: Urology;  Laterality: Left;  . Cystoscopy w/ ureteral stent placement Left 04/11/2014  Procedure: CYSTOSCOPY WITH STENT REPLACEMENT;  Surgeon: Malka So, MD;  Location: [facility];  Service: Urology;  Laterality: Left;  . Cystoscopy with ureteroscopy Left 04/11/2014    Procedure: CYSTOSCOPY WITH URETEROSCOPY/ STONE EXTRACTION;  Surgeon: Malka So, MD;  Location: [facility];  Service: Urology;  Laterality: Left;  . Peripheral vascular catheterization N/A 11/01/2014    Procedure: Lower Extremity Angiography;  Surgeon:  Serafina Mitchell, MD;  Location: [place] [facility];  Service: Cardiovascular;  Laterality: N/A;  . Abdominal aortagram  11/01/2014    Procedure: Abdominal Aortagram;  Surgeon: Serafina Mitchell, MD;  Location: [place] [facility];  Service: Cardiovascular;;  . Aorta - bilateral femoral artery bypass graft N/A 12/28/2014    Procedure: AORTOBIFEMORAL BYPASS GRAFT;  Surgeon: Serafina Mitchell, MD;  Location: [place];  Service: Vascular;  Laterality: N/A;    History   Social History  . Marital Status: Married    Spouse Name: N/A  . Number of Children: 2  . Years of Education: N/A   Occupational History  . Not on file.   Social History Main Topics  . Smoking status: Current Every Day Smoker -- 2.00 packs/day for 50 years    Types: Cigarettes  . Smokeless tobacco: Never Used     Comment: PT HAS CUT DOWN TO 1PPD FROM 2 PPD LAST YR (2014)  . Alcohol Use: No  . Drug Use: No  . Sexual Activity: Not on file   Other Topics Concern  . Not on file   Social History Narrative   Patient is married with 2 children.   Patient is right handed.   Patient has some college education.   Patient does not drink caffeine.    Family History  Problem Relation Age of Onset  . Colon cancer Neg Hx   . Dementia Mother   . Hypertension Mother   . Dementia Father   . Hypertension Sister   . Stroke Mother 27    Allergies as of 01/15/2015 - Review Complete 01/15/2015  Allergen Reaction Noted  . Morphine and related Anxiety and Other (See Comments) 01/15/2015  . Tape Other (See Comments) and Rash 09/02/2013  . Amoxicillin Other (See Comments) 11/26/2012  . Ibuprofen Swelling 11/26/2012  . Naproxen Swelling 11/26/2012    Current Outpatient Prescriptions on File Prior to Visit  Medication Sig Dispense Refill  . amLODipine (NORVASC) 10 MG tablet Take 10 mg by mouth every morning.     Marland Kitchen aspirin EC 81 MG tablet Take 81 mg by mouth daily.    . cholecalciferol (VITAMIN D) 1000 UNITS tablet Take 1,000 Units by  mouth daily.    . Coenzyme Q10 (COQ10) 100 MG CAPS Take 1 capsule by mouth daily.    . Cyanocobalamin (VITAMIN B-12 PO) Take 1,000 mcg by mouth daily.     . Ferrous Sulfate (IRON) 325 (65 FE) MG TABS Take 1 tablet by mouth every morning.    . fluticasone (FLONASE) 50 MCG/ACT nasal spray Place 2 sprays into the nose 2 (two) times daily.    . indapamide (LOZOL) 2.5 MG tablet Take 2.5 mg by mouth daily.    Marland Kitchen lisinopril (PRINIVIL,ZESTRIL) 20 MG tablet Take 20 mg by mouth daily.    Marland Kitchen loratadine (CLARITIN) 10 MG tablet Take 10 mg by mouth daily.    Marland Kitchen oxyCODONE (OXY IR/ROXICODONE) 5 MG immediate release tablet Take 1 tablet (5 mg total) by mouth every 6 (six) hours as needed for moderate  pain. 30 tablet 0  . potassium citrate (UROCIT-K) 10 MEQ (1080 MG) SR tablet Take 10 mEq by mouth 3 (three) times daily with meals.    . pravastatin (PRAVACHOL) 20 MG tablet Take 20 mg by mouth daily.    . varenicline (CHANTIX) 0.5 MG tablet Take 1 tablet (0.5 mg total) by mouth 2 (two) times daily. 60 tablet 3  . cilostazol (PLETAL) 100 MG tablet Take 1 tablet (100 mg total) by mouth 2 (two) times daily before a meal. (Patient not taking: Reported on 01/15/2015) 60 tablet 11   No current facility-administered medications on file prior to visit.     REVIEW OF SYSTEMS:  see  History  Of present illness  PHYSICAL EXAMINATION:   Vital signs are  Filed Vitals:   01/15/15 1527  BP: 99/84  Pulse: 116  Temp: 97.5 F (36.4 C)  TempSrc: Oral  Resp: 18  Height: '5\' 10"'$  (1.778 m)  Weight: 163 lb (73.936 kg)  SpO2: 97%   Body mass index is 23.39 kg/(m^2). General: The patient appears their stated age. HEENT:  No gross abnormalities Pulmonary:  Non labored breathing Abdomen: Soft and non-tender Musculoskeletal: There are no major deformities. Neurologic: No focal weakness or paresthesias are detected, Skin: There are no ulcer or rashes noted. Psychiatric: The patient has normal affect. Cardiovascular:  Palpable  pedal pulses   Incisions healing nicely   Diagnostic Studies  none  Assessment:  status post aortobifemoral bypass graft Plan:  overall the patient is doing very well. He is having difficulty with anorexia.  He states that the food has no taste.  He has been losing weight and his energy level is down. I explained to have of these are typical postoperative issues.  I have encouraged him to use nutritional supplements and to find anything that he can't eat.  I have encouraged him to be as active as possible. I did give him 40 oxycodone 5 mg tablets.  He'll follow-up with me in one month  V. Leia Alf, M.D. [facility] [place] Office: [phone] Pager:  [phone]

## 2015-01-19 ENCOUNTER — Encounter (HOSPITAL_COMMUNITY): Payer: Self-pay | Admitting: *Deleted

## 2015-01-19 ENCOUNTER — Emergency Department (HOSPITAL_COMMUNITY)
Admission: EM | Admit: 2015-01-19 | Discharge: 2015-01-19 | Disposition: A | Discharge status: Home / Self Care | Payer: BLUE CROSS/BLUE SHIELD | Attending: Emergency Medicine | Admitting: Emergency Medicine

## 2015-01-19 DIAGNOSIS — R5381 Other malaise: Secondary | ICD-10-CM | POA: Insufficient documentation

## 2015-01-19 DIAGNOSIS — Z880 Allergy status to penicillin: Secondary | ICD-10-CM | POA: Diagnosis not present

## 2015-01-19 DIAGNOSIS — R109 Unspecified abdominal pain: Secondary | ICD-10-CM | POA: Diagnosis not present

## 2015-01-19 DIAGNOSIS — Z79899 Other long term (current) drug therapy: Secondary | ICD-10-CM | POA: Diagnosis not present

## 2015-01-19 DIAGNOSIS — Z86010 Personal history of colonic polyps: Secondary | ICD-10-CM | POA: Diagnosis not present

## 2015-01-19 DIAGNOSIS — I959 Hypotension, unspecified: Secondary | ICD-10-CM

## 2015-01-19 DIAGNOSIS — Z8709 Personal history of other diseases of the respiratory system: Secondary | ICD-10-CM | POA: Insufficient documentation

## 2015-01-19 DIAGNOSIS — E611 Iron deficiency: Secondary | ICD-10-CM | POA: Insufficient documentation

## 2015-01-19 DIAGNOSIS — R630 Anorexia: Secondary | ICD-10-CM | POA: Diagnosis not present

## 2015-01-19 DIAGNOSIS — I129 Hypertensive chronic kidney disease with stage 1 through stage 4 chronic kidney disease, or unspecified chronic kidney disease: Secondary | ICD-10-CM | POA: Diagnosis not present

## 2015-01-19 DIAGNOSIS — Z87448 Personal history of other diseases of urinary system: Secondary | ICD-10-CM | POA: Diagnosis not present

## 2015-01-19 DIAGNOSIS — Z9889 Other specified postprocedural states: Secondary | ICD-10-CM | POA: Insufficient documentation

## 2015-01-19 DIAGNOSIS — Z7951 Long term (current) use of inhaled steroids: Secondary | ICD-10-CM | POA: Insufficient documentation

## 2015-01-19 DIAGNOSIS — Z720 Tobacco use: Secondary | ICD-10-CM | POA: Diagnosis not present

## 2015-01-19 DIAGNOSIS — Z87438 Personal history of other diseases of male genital organs: Secondary | ICD-10-CM | POA: Diagnosis not present

## 2015-01-19 DIAGNOSIS — N2889 Other specified disorders of kidney and ureter: Secondary | ICD-10-CM | POA: Diagnosis not present

## 2015-01-19 DIAGNOSIS — E860 Dehydration: Secondary | ICD-10-CM | POA: Diagnosis not present

## 2015-01-19 DIAGNOSIS — Z7982 Long term (current) use of aspirin: Secondary | ICD-10-CM | POA: Diagnosis not present

## 2015-01-19 DIAGNOSIS — Z4801 Encounter for change or removal of surgical wound dressing: Secondary | ICD-10-CM | POA: Diagnosis not present

## 2015-01-19 DIAGNOSIS — N189 Chronic kidney disease, unspecified: Secondary | ICD-10-CM | POA: Insufficient documentation

## 2015-01-19 DIAGNOSIS — R300 Dysuria: Secondary | ICD-10-CM | POA: Insufficient documentation

## 2015-01-19 DIAGNOSIS — N289 Disorder of kidney and ureter, unspecified: Secondary | ICD-10-CM

## 2015-01-19 DIAGNOSIS — Z5189 Encounter for other specified aftercare: Secondary | ICD-10-CM

## 2015-01-19 DIAGNOSIS — R404 Transient alteration of awareness: Secondary | ICD-10-CM | POA: Diagnosis not present

## 2015-01-19 DIAGNOSIS — R531 Weakness: Secondary | ICD-10-CM | POA: Diagnosis not present

## 2015-01-19 LAB — CBC
HEMATOCRIT: 44.9 % (ref 39.0–52.0)
Hemoglobin: 15.6 g/dL (ref 13.0–17.0)
MCH: 30.6 pg (ref 26.0–34.0)
MCHC: 34.7 g/dL (ref 30.0–36.0)
MCV: 88 fL (ref 78.0–100.0)
Platelets: 279 10*3/uL (ref 150–400)
RBC: 5.1 MIL/uL (ref 4.22–5.81)
RDW: 12.5 % (ref 11.5–15.5)
WBC: 11.9 10*3/uL — AB (ref 4.0–10.5)

## 2015-01-19 LAB — URINALYSIS, ROUTINE W REFLEX MICROSCOPIC
GLUCOSE, UA: NEGATIVE mg/dL
Hgb urine dipstick: NEGATIVE
Ketones, ur: 15 mg/dL — AB
NITRITE: NEGATIVE
PH: 5.5 (ref 5.0–8.0)
PROTEIN: NEGATIVE mg/dL
Specific Gravity, Urine: 1.022 (ref 1.005–1.030)
Urobilinogen, UA: 1 mg/dL (ref 0.0–1.0)

## 2015-01-19 LAB — BASIC METABOLIC PANEL
Anion gap: 11 (ref 5–15)
BUN: 26 mg/dL — ABNORMAL HIGH (ref 6–20)
CHLORIDE: 94 mmol/L — AB (ref 101–111)
CO2: 28 mmol/L (ref 22–32)
CREATININE: 1.51 mg/dL — AB (ref 0.61–1.24)
Calcium: 11.1 mg/dL — ABNORMAL HIGH (ref 8.9–10.3)
GFR calc Af Amer: 53 mL/min — ABNORMAL LOW (ref >60)
GFR calc non Af Amer: 46 mL/min — ABNORMAL LOW (ref >60)
Glucose, Bld: 124 mg/dL — ABNORMAL HIGH (ref 65–99)
POTASSIUM: 4.3 mmol/L (ref 3.5–5.1)
SODIUM: 133 mmol/L — AB (ref 135–145)

## 2015-01-19 LAB — URINE MICROSCOPIC-ADD ON

## 2015-01-19 MED ORDER — SODIUM CHLORIDE 0.9 % IV BOLUS (SEPSIS)
1000.0000 mL | Freq: Once | INTRAVENOUS | Status: AC
  Administered 2015-01-19: 1000 mL via INTRAVENOUS

## 2015-01-19 NOTE — Discharge Instructions (Signed)
Continue to not take your blood pressure medicine until you get your blood pressure rechecked in 3 or 4 days. Return here, if needed, for problems.   Dehydration, Adult Dehydration is when you lose more fluids from the body than you take in. Vital organs like the kidneys, brain, and heart cannot function without a proper amount of fluids and salt. Any loss of fluids from the body can cause dehydration.  CAUSES   Vomiting.  Diarrhea.  Excessive sweating.  Excessive urine output.  Fever. SYMPTOMS  Mild dehydration  Thirst.  Dry lips.  Slightly dry mouth. Moderate dehydration  Very dry mouth.  Sunken eyes.  Skin does not bounce back quickly when lightly pinched and released.  Dark urine and decreased urine production.  Decreased tear production.  Headache. Severe dehydration  Very dry mouth.  Extreme thirst.  Rapid, weak pulse (more than 100 beats per minute at rest).  Cold hands and feet.  Not able to sweat in spite of heat and temperature.  Rapid breathing.  Blue lips.  Confusion and lethargy.  Difficulty being awakened.  Minimal urine production.  No tears. DIAGNOSIS  Your caregiver will diagnose dehydration based on your symptoms and your exam. Blood and urine tests will help confirm the diagnosis. The diagnostic evaluation should also identify the cause of dehydration. TREATMENT  Treatment of mild or moderate dehydration can often be done at home by increasing the amount of fluids that you drink. It is best to drink small amounts of fluid more often. Drinking too much at one time can make vomiting worse. Refer to the home care instructions below. Severe dehydration needs to be treated at the hospital where you will probably be given intravenous (IV) fluids that contain water and electrolytes. HOME CARE INSTRUCTIONS   Ask your caregiver about specific rehydration instructions.  Drink enough fluids to keep your urine clear or pale yellow.  Drink  small amounts frequently if you have nausea and vomiting.  Eat as you normally do.  Avoid:  Foods or drinks high in sugar.  Carbonated drinks.  Juice.  Extremely hot or cold fluids.  Drinks with caffeine.  Fatty, greasy foods.  Alcohol.  Tobacco.  Overeating.  Gelatin desserts.  Wash your hands well to avoid spreading bacteria and viruses.  Only take over-the-counter or prescription medicines for pain, discomfort, or fever as directed by your caregiver.  Ask your caregiver if you should continue all prescribed and over-the-counter medicines.  Keep all follow-up appointments with your caregiver. SEEK MEDICAL CARE IF:  You have abdominal pain and it increases or stays in one area (localizes).  You have a rash, stiff neck, or severe headache.  You are irritable, sleepy, or difficult to awaken.  You are weak, dizzy, or extremely thirsty. SEEK IMMEDIATE MEDICAL CARE IF:   You are unable to keep fluids down or you get worse despite treatment.  You have frequent episodes of vomiting or diarrhea.  You have blood or green matter (bile) in your vomit.  You have blood in your stool or your stool looks black and tarry.  You have not urinated in 6 to 8 hours, or you have only urinated a small amount of very dark urine.  You have a fever.  You faint. MAKE SURE YOU:   Understand these instructions.  Will watch your condition.  Will get help right away if you are not doing well or get worse. Document Released: 06/09/2005 Document Revised: 09/01/2011 Document Reviewed: 01/27/2011 [facility] Patient Information 2015 [place], [place].  This information is not intended to replace advice given to you by your health care provider. Make sure you discuss any questions you have with your health care provider.  Hypotension As your heart beats, it forces blood through your arteries. This force is your blood pressure. If your blood pressure is too low for you to go about your  normal activities or to support the organs of your body, you have hypotension. Hypotension is also referred to as low blood pressure. When your blood pressure becomes too low, you may not get enough blood to your brain. As a result, you may feel weak, feel lightheaded, or develop a rapid heart rate. In a more severe case, you may faint. CAUSES Various conditions can cause hypotension. These include:  Blood loss.  Dehydration.  Heart or endocrine problems.  Pregnancy.  Severe infection.  Not having a well-balanced diet filled with needed nutrients.  Severe allergic reactions (anaphylaxis). Some medicines, such as blood pressure medicine or water pills (diuretics), may lower your blood pressure below normal. Sometimes taking too much medicine or taking medicine not as directed can cause hypotension. TREATMENT  Hospitalization is sometimes required for hypotension if fluid or blood replacement is needed, if time is needed for medicines to wear off, or if further monitoring is needed. Treatment might include changing your diet, changing your medicines (including medicines aimed at raising your blood pressure), and use of support stockings. HOME CARE INSTRUCTIONS   Drink enough fluids to keep your urine clear or pale yellow.  Take your medicines as directed by your health care provider.  Get up slowly from reclining or sitting positions. This gives your blood pressure a chance to adjust.  Wear support stockings as directed by your health care provider.  Maintain a healthy diet by including nutritious food, such as fruits, vegetables, nuts, whole grains, and lean meats. SEEK MEDICAL CARE IF:  You have vomiting or diarrhea.  You have a fever for more than 2-3 days.  You feel more thirsty than usual.  You feel weak and tired. SEEK IMMEDIATE MEDICAL CARE IF:   You have chest pain or a fast or irregular heartbeat.  You have a loss of feeling in some part of your body, or you lose  movement in your arms or legs.  You have trouble speaking.  You become sweaty or feel lightheaded.  You faint. MAKE SURE YOU:   Understand these instructions.  Will watch your condition.  Will get help right away if you are not doing well or get worse. Document Released: 06/09/2005 Document Revised: 03/30/2013 Document Reviewed: 12/10/2012 [facility] Patient Information 2015 [place], [place]. This information is not intended to replace advice given to you by your health care provider. Make sure you discuss any questions you have with your health care provider.  Wound Check Your wound appears healthy today. Your wound will heal gradually over time. Eventually a scar will form that will fade with time. FACTORS THAT AFFECT SCAR FORMATION:  People differ in the severity in which they scar.  Scar severity varies according to location, size, and the traits you inherited from your parents (genetic predisposition).  Irritation to the wound from infection, rubbing, or chemical exposure will increase the amount of scar formation. HOME CARE INSTRUCTIONS   If you were given a dressing, you should change it at least once a day or as instructed by your caregiver. If the bandage sticks, soak it off with a solution of hydrogen peroxide.  If the bandage becomes wet,  dirty, or develops a bad smell, change it as soon as possible.  Look for signs of infection.  Only take over-the-counter or prescription medicines for pain, discomfort, or fever as directed by your caregiver. SEEK IMMEDIATE MEDICAL CARE IF:   You have redness, swelling, or increasing pain in the wound.  You notice pus coming from the wound.  You have a fever.  You notice a bad smell coming from the wound or dressing. Document Released: 03/15/2004 Document Revised: 09/01/2011 Document Reviewed: 06/09/2005 [facility] Patient Information 2015 [place], [place]. This information is not intended to replace advice given to you by your  health care provider. Make sure you discuss any questions you have with your health care provider.  Wound Check Your wound appears healthy today. Your wound will heal gradually over time. Eventually a scar will form that will fade with time. FACTORS THAT AFFECT SCAR FORMATION:  People differ in the severity in which they scar.  Scar severity varies according to location, size, and the traits you inherited from your parents (genetic predisposition).  Irritation to the wound from infection, rubbing, or chemical exposure will increase the amount of scar formation. HOME CARE INSTRUCTIONS   If you were given a dressing, you should change it at least once a day or as instructed by your caregiver. If the bandage sticks, soak it off with a solution of hydrogen peroxide.  If the bandage becomes wet, dirty, or develops a bad smell, change it as soon as possible.  Look for signs of infection.  Only take over-the-counter or prescription medicines for pain, discomfort, or fever as directed by your caregiver. SEEK IMMEDIATE MEDICAL CARE IF:   You have redness, swelling, or increasing pain in the wound.  You notice pus coming from the wound.  You have a fever.  You notice a bad smell coming from the wound or dressing. Document Released: 03/15/2004 Document Revised: 09/01/2011 Document Reviewed: 06/09/2005 [facility] Patient Information 2015 [place], [place]. This information is not intended to replace advice given to you by your health care provider. Make sure you discuss any questions you have with your health care provider.

## 2015-01-19 NOTE — ED Provider Notes (Signed)
CSN: [id number]     Arrival date & time 01/19/15  1215 History   First MD Initiated Contact with Patient 01/19/15 1226     Chief Complaint  Patient presents with  . Weakness  . Post-op Problem     (Consider location/radiation/quality/duration/timing/severity/associated sxs/prior Treatment) HPI   Chad Gomez is a 69 y.o. male who presents for evaluation of weakness, decreased appetite, low blood pressure and decreased urine output for 1 week. Status post abdominal vascular surgery, 3 weeks ago. No reported fever, cough, chest pain, focal weakness, dizziness, nausea or vomiting. His PCP recommended that he hold his blood pressure medicine, 2 days ago, after it was found to be low by a physical therapist. He is taking his other medications, without relief. There are no other known modifying factors.   Past Medical History  Diagnosis Date  . Hypertension   . Iron deficiency   . History of bladder stone   . Seasonal allergies   . BPH (benign prostatic hypertrophy)   . Right ureteral stone   . History of melanoma excision     LEFT EAR  . History of colon polyps     08/2013   pre-canerous  . Complication of anesthesia     urinary retention  . Chronic kidney disease   . Peripheral vascular disease   . Cough    Past Surgical History  Procedure Laterality Date  . Cystolithalopaxy of bladder stone/ transrectal ultrasound prostate bx  08-19-2005  . Cystoscopy with litholapaxy Right 12/02/2012    Procedure: CYSTOSCOPY WITH stone extraction;  Surgeon: Malka So, MD;  Location: [facility];  Service: Urology;  Laterality: Right;  . Ureteroscopy Right 12/02/2012    Procedure: RIGHT URETEROSCOPY STONE EXTRACTION WITH  STENT PLACEMENT;  Surgeon: Malka So, MD;  Location: [facility];  Service: Urology;  Laterality: Right;  . Holmium laser application Right 0/17/5102    Procedure: HOLMIUM LASER APPLICATION;  Surgeon: Malka So, MD;  Location: [facility];  Service: Urology;  Laterality: Right;  . Cystoscopy w/ retrogrades Right 12/02/2012    Procedure: CYSTOSCOPY WITH RETROGRADE PYELOGRAM;  Surgeon: Malka So, MD;  Location: [facility];  Service: Urology;  Laterality: Right;  . Nephrolithotomy Left 01/27/2013    Procedure: NEPHROLITHOTOMY PERCUTANEOUS FIRST LOOK;  Surgeon: Malka So, MD;  Location: [facility];  Service: Urology;  Laterality: Left;  Marland Kitchen Mohs surgery  1996    left ear  . Multiple tooth extractions  1994  . Mandible fracture surgery  1995  . Inguinal hernia repair Right 1954  . Colonoscopy w/ polypectomy  09-13-2013  . Cystoscopy with litholapaxy Right 10/13/2013    Procedure: CYSTOSCOPY WITH LITHOLAPAXY, removal of urethral stone with basket;  Surgeon: Irine Seal, MD;  Location: [facility];  Service: Urology;  Laterality: Right;  . Cystoscopy w/ retrogrades Bilateral 10/13/2013    Procedure: CYSTOSCOPY WITH RETROGRADE PYELOGRAM;  Surgeon: Irine Seal, MD;  Location: [facility];  Service: Urology;  Laterality: Bilateral;  . Lithotripsy  2007 & 02/2013  . Cystoscopy with retrograde pyelogram, ureteroscopy and stent placement Left 03/21/2014    Procedure: CYSTOSCOPY WITH RETROGRADE PYELOGRAM, AND LEFT STENT PLACEMENT;  Surgeon: Alexis Frock, MD;  Location: [facility];  Service: Urology;  Laterality: Left;  . Holmium laser application Left 58/52/7782    Procedure: HOLMIUM LASER APPLICATION;  Surgeon: Malka So, MD;  Location: [facility];  Service: Urology;  Laterality: Left;  . Cystoscopy w/ ureteral stent placement Left 04/11/2014    Procedure: CYSTOSCOPY WITH STENT REPLACEMENT;  Surgeon: Malka So, MD;  Location: [facility];  Service: Urology;  Laterality: Left;  . Cystoscopy with ureteroscopy Left 04/11/2014    Procedure: CYSTOSCOPY WITH URETEROSCOPY/ STONE EXTRACTION;  Surgeon: Malka So, MD;  Location: [facility];   Service: Urology;  Laterality: Left;  . Peripheral vascular catheterization N/A 11/01/2014    Procedure: Lower Extremity Angiography;  Surgeon: Serafina Mitchell, MD;  Location: [place] [facility];  Service: Cardiovascular;  Laterality: N/A;  . Abdominal aortagram  11/01/2014    Procedure: Abdominal Aortagram;  Surgeon: Serafina Mitchell, MD;  Location: [place] [facility];  Service: Cardiovascular;;  . Aorta - bilateral femoral artery bypass graft N/A 12/28/2014    Procedure: AORTOBIFEMORAL BYPASS GRAFT;  Surgeon: Serafina Mitchell, MD;  Location: [facility];  Service: Vascular;  Laterality: N/A;   Family History  Problem Relation Age of Onset  . Colon cancer Neg Hx   . Dementia Mother   . Hypertension Mother   . Dementia Father   . Hypertension Sister   . Stroke Mother 82   History  Substance Use Topics  . Smoking status: Current Every Day Smoker -- 2.00 packs/day for 50 years    Types: Cigarettes  . Smokeless tobacco: Never Used     Comment: PT HAS CUT DOWN TO 1PPD FROM 2 PPD LAST YR (2014)  . Alcohol Use: No    Review of Systems  All other systems reviewed and are negative.     Allergies  Morphine and related; Tape; Amoxicillin; Ibuprofen; and Naproxen  Home Medications   Prior to Admission medications   Medication Sig Start Date End Date Taking? Authorizing Provider  amLODipine (NORVASC) 10 MG tablet Take 10 mg by mouth every morning.     Historical Provider, MD  aspirin EC 81 MG tablet Take 81 mg by mouth daily.    Historical Provider, MD  cholecalciferol (VITAMIN D) 1000 UNITS tablet Take 1,000 Units by mouth daily.    Historical Provider, MD  cilostazol (PLETAL) 100 MG tablet Take 1 tablet (100 mg total) by mouth 2 (two) times daily before a meal. Patient not taking: Reported on 01/15/2015 08/14/14   Serafina Mitchell, MD  Coenzyme Q10 (COQ10) 100 MG CAPS Take 1 capsule by mouth daily.    Historical Provider, MD  Cyanocobalamin (VITAMIN B-12 PO) Take 1,000 mcg by mouth daily.      Historical Provider, MD  Ferrous Sulfate (IRON) 325 (65 FE) MG TABS Take 1 tablet by mouth every morning.    Historical Provider, MD  fluticasone (FLONASE) 50 MCG/ACT nasal spray Place 2 sprays into the nose 2 (two) times daily.    Historical Provider, MD  indapamide (LOZOL) 2.5 MG tablet Take 2.5 mg by mouth daily.    Historical Provider, MD  lisinopril (PRINIVIL,ZESTRIL) 20 MG tablet Take 20 mg by mouth daily.    Historical Provider, MD  loratadine (CLARITIN) 10 MG tablet Take 10 mg by mouth daily.    Historical Provider, MD  oxyCODONE (OXY IR/ROXICODONE) 5 MG immediate release tablet Take 1 tablet (5 mg total) by mouth every 6 (six) hours as needed for moderate pain. 01/09/15   Mal Misty, MD  potassium citrate (UROCIT-K) 10 MEQ (1080 MG) SR tablet Take 10 mEq by mouth 3 (three) times daily with meals.    Historical Provider, MD  pravastatin (PRAVACHOL)  20 MG tablet Take 20 mg by mouth daily.    Historical Provider, MD  varenicline (CHANTIX) 0.5 MG tablet Take 1 tablet (0.5 mg total) by mouth 2 (two) times daily. 11/24/14   Minus Breeding, MD   BP 108/79 mmHg  Pulse 94  Temp(Src) 98.6 F (37 C) (Oral)  Resp 22  Ht '5\' 10"'$  (1.778 m)  Wt 163 lb (73.936 kg)  BMI 23.39 kg/m2  SpO2 95% Physical Exam  Constitutional: He is oriented to person, place, and time. He appears well-developed and well-nourished.  HENT:  Head: Normocephalic and atraumatic.  Right Ear: External ear normal.  Left Ear: External ear normal.  Mouth/Throat: Oropharynx is clear and moist.  Eyes: Conjunctivae and EOM are normal. Pupils are equal, round, and reactive to light.  Neck: Normal range of motion and phonation normal. Neck supple.  Cardiovascular: Normal rate, regular rhythm and normal heart sounds.   Pulmonary/Chest: Effort normal and breath sounds normal. He exhibits no bony tenderness.  Abdominal: Soft. There is tenderness (mild diffuse tenderness which she states has been his baseline for several weeks.  Well-healed midline vertical surgical scar.).  Musculoskeletal: Normal range of motion.  Neurological: He is alert and oriented to person, place, and time. No cranial nerve deficit or sensory deficit. He exhibits normal muscle tone. Coordination normal.  Skin: Skin is warm, dry and intact.  Psychiatric: He has a normal mood and affect. His behavior is normal. Judgment and thought content normal.  Nursing note and vitals reviewed.   ED Course  Procedures (including critical care time)  Medications  sodium chloride 0.9 % bolus 1,000 mL (1,000 mLs Intravenous New Bag/Given 01/19/15 1401)    Patient Vitals for the past 24 hrs:  BP Temp Temp src Pulse Resp SpO2 Height Weight  01/19/15 1500 108/79 mmHg - - 94 22 95 % - -  01/19/15 1445 106/92 mmHg - - 93 22 96 % - -  01/19/15 1430 124/74 mmHg - - 90 21 99 % - -  01/19/15 1415 122/77 mmHg - - 88 20 96 % - -  01/19/15 1400 129/93 mmHg - - 105 16 94 % - -  01/19/15 1330 104/91 mmHg - - 97 (!) 27 95 % - -  01/19/15 1300 114/78 mmHg - - 102 21 96 % - -  01/19/15 1245 99/76 mmHg - - 101 20 96 % - -  01/19/15 1230 106/77 mmHg - - 103 22 97 % - -  01/19/15 1220 114/79 mmHg 98.6 F (37 C) Oral 104 20 98 % '5\' 10"'$  (1.778 m) 163 lb (73.936 kg)    4:18 PM Reevaluation with update and discussion. After initial assessment and treatment, an updated evaluation reveals tolerating some oral nutrition. He denies dysuria or urinary frequency. Findings discussed with patient and wife, all questions answered. Marga Gramajo L     Labs Review Labs Reviewed  BASIC METABOLIC PANEL - Abnormal; Notable for the following:    Sodium 133 (*)    Chloride 94 (*)    Glucose, Bld 124 (*)    BUN 26 (*)    Creatinine, Ser 1.51 (*)    Calcium 11.1 (*)    GFR calc non Af Amer 46 (*)    GFR calc Af Amer 53 (*)    All other components within normal limits  CBC - Abnormal; Notable for the following:    WBC 11.9 (*)    All other components within normal limits   URINALYSIS, ROUTINE W REFLEX MICROSCOPIC (NOT  AT [facility]) - Abnormal; Notable for the following:    Bilirubin Urine SMALL (*)    Ketones, ur 15 (*)    Leukocytes, UA TRACE (*)    All other components within normal limits  URINE MICROSCOPIC-ADD ON - Abnormal; Notable for the following:    Casts HYALINE CASTS (*)    All other components within normal limits   BUN  Date Value Ref Range Status  01/19/2015 26* 6 - 20 mg/dL Final  01/04/2015 16 6 - 20 mg/dL Final  01/03/2015 22* 6 - 20 mg/dL Final  01/01/2015 16 6 - 20 mg/dL Final   CREATININE, SER  Date Value Ref Range Status  01/19/2015 1.51* 0.61 - 1.24 mg/dL Final  01/04/2015 1.13 0.61 - 1.24 mg/dL Final  01/03/2015 1.12 0.61 - 1.24 mg/dL Final  01/01/2015 0.99 0.61 - 1.24 mg/dL Final      Imaging Review No results found.   EKG Interpretation   Date/Time:  Friday January 19 2015 12:19:43 EDT Ventricular Rate:  106 PR Interval:  189 QRS Duration: 84 QT Interval:  319 QTC Calculation: 424 R Axis:   -49 Text Interpretation:  Sinus tachycardia Probable left atrial enlargement  Left anterior fascicular block Low voltage, extremity leads since last  tracing no significant change Confirmed by Halen Antenucci  MD, Nevea Spiewak ([id number]) on  01/19/2015 1:24:08 PM      MDM   Final diagnoses:  None    Postoperative, malaise, and decreased oral intake associated with slow recovery, following major surgery. No clinical evidence for urinary tract infection with mildly abnormal urinalysis. He is mildly dehydrated with a bump in his creatinine and BUN. He feels better after treatment in the ED, and has tolerated oral liquids, and fluids. He and his wife are comfortable going home, and that he will need further evaluation.  Nursing Notes Reviewed/ Care Coordinated Applicable Imaging Reviewed Interpretation of Laboratory Data incorporated into ED treatment  The patient appears reasonably screened and/or stabilized for discharge and I doubt any other  medical condition or other [facility] requiring further screening, evaluation, or treatment in the ED at this time prior to discharge.  Plan: Home Medications- continue to hold blood pressure medicines for 3 or 4 days until rechecked; Home Treatments- rest, push diet; return here if the recommended treatment, does not improve the symptoms; Recommended follow up- PCP or here when necessary     Daleen Bo, MD 01/19/15 1712

## 2015-01-19 NOTE — ED Notes (Signed)
Pt presents via [facility] from MD office for generalized weakness, decrease in appetite, decreased output, and hypotension.  Pt reports having an aortobifemoral bypass on the 7th, was doing well after until approximately x 1 week ago.  Pt denies N/V/pain, denies fever.  Pt reports no BM in 3 days.  BP-120/86 P-105, Pt a x 4, NAD.

## 2015-01-19 NOTE — ED Notes (Signed)
Pt able to tolerate 16oz of water, will update MD.

## 2015-01-19 NOTE — ED Notes (Signed)
Pt tolerating fluids well at this time.

## 2015-01-21 LAB — URINE CULTURE: Special Requests: NORMAL

## 2015-01-23 DIAGNOSIS — Z720 Tobacco use: Secondary | ICD-10-CM | POA: Diagnosis not present

## 2015-01-23 DIAGNOSIS — Z85820 Personal history of malignant melanoma of skin: Secondary | ICD-10-CM | POA: Diagnosis not present

## 2015-01-23 DIAGNOSIS — I745 Embolism and thrombosis of iliac artery: Secondary | ICD-10-CM | POA: Diagnosis not present

## 2015-01-23 DIAGNOSIS — I739 Peripheral vascular disease, unspecified: Secondary | ICD-10-CM | POA: Diagnosis not present

## 2015-01-23 DIAGNOSIS — I129 Hypertensive chronic kidney disease with stage 1 through stage 4 chronic kidney disease, or unspecified chronic kidney disease: Secondary | ICD-10-CM | POA: Diagnosis not present

## 2015-01-23 DIAGNOSIS — D509 Iron deficiency anemia, unspecified: Secondary | ICD-10-CM | POA: Diagnosis not present

## 2015-01-23 DIAGNOSIS — Z95828 Presence of other vascular implants and grafts: Secondary | ICD-10-CM | POA: Diagnosis not present

## 2015-01-23 DIAGNOSIS — N189 Chronic kidney disease, unspecified: Secondary | ICD-10-CM | POA: Diagnosis not present

## 2015-01-23 DIAGNOSIS — Z48812 Encounter for surgical aftercare following surgery on the circulatory system: Secondary | ICD-10-CM | POA: Diagnosis not present

## 2015-02-05 ENCOUNTER — Encounter: Payer: BLUE CROSS/BLUE SHIELD | Admitting: Surgery

## 2015-02-16 ENCOUNTER — Encounter: Payer: Self-pay | Admitting: Surgery

## 2015-02-19 ENCOUNTER — Encounter: Payer: Self-pay | Admitting: Surgery

## 2015-02-19 ENCOUNTER — Ambulatory Visit (INDEPENDENT_AMBULATORY_CARE_PROVIDER_SITE_OTHER): Payer: Self-pay | Admitting: Surgery

## 2015-02-19 VITALS — BP 119/53 | HR 76 | Temp 97.3°F | Ht 70.0 in | Wt 160.0 lb

## 2015-02-19 DIAGNOSIS — I70219 Atherosclerosis of native arteries of extremities with intermittent claudication, unspecified extremity: Secondary | ICD-10-CM

## 2015-02-19 NOTE — Progress Notes (Signed)
Patient name: Chad Gomez MRN: [id number] DOB: 18-Jun-1946 Sex: male     Chief Complaint  Patient presents with  . Re-evaluation    1 month follow up Still having some pain    HISTORY OF PRESENT ILLNESS: The patient is back for follow-up. He is status post aortobifemoral bypass graft using a 16 x 8 bifurcated dacryon graft on 12/29/2014. This was done for left iliac occlusion. The patient's postoperative course was uncomplicated.  At his first postoperative visit, he was having issues with anorexia. He states that these are improving but he still has moments where food does not taste well and he cannot eat.  Other moments he can eat a full meal. He is eager to get back to work.  Past Medical History  Diagnosis Date  . Hypertension   . Iron deficiency   . History of bladder stone   . Seasonal allergies   . BPH (benign prostatic hypertrophy)   . Right ureteral stone   . History of melanoma excision     LEFT EAR  . History of colon polyps     08/2013   pre-canerous  . Complication of anesthesia     urinary retention  . Chronic kidney disease   . Peripheral vascular disease   . Cough     Past Surgical History  Procedure Laterality Date  . Cystolithalopaxy of bladder stone/ transrectal ultrasound prostate bx  08-19-2005  . Cystoscopy with litholapaxy Right 12/02/2012    Procedure: CYSTOSCOPY WITH stone extraction;  Surgeon: Malka So, MD;  Location: [facility];  Service: Urology;  Laterality: Right;  . Ureteroscopy Right 12/02/2012    Procedure: RIGHT URETEROSCOPY STONE EXTRACTION WITH  STENT PLACEMENT;  Surgeon: Malka So, MD;  Location: [facility];  Service: Urology;  Laterality: Right;  . Holmium laser application Right 0/35/4656    Procedure: HOLMIUM LASER APPLICATION;  Surgeon: Malka So, MD;  Location: [facility];  Service: Urology;  Laterality: Right;  . Cystoscopy w/ retrogrades Right 12/02/2012   Procedure: CYSTOSCOPY WITH RETROGRADE PYELOGRAM;  Surgeon: Malka So, MD;  Location: [facility];  Service: Urology;  Laterality: Right;  . Nephrolithotomy Left 01/27/2013    Procedure: NEPHROLITHOTOMY PERCUTANEOUS FIRST LOOK;  Surgeon: Malka So, MD;  Location: [facility];  Service: Urology;  Laterality: Left;  Marland Kitchen Mohs surgery  1996    left ear  . Multiple tooth extractions  1994  . Mandible fracture surgery  1995  . Inguinal hernia repair Right 1954  . Colonoscopy w/ polypectomy  09-13-2013  . Cystoscopy with litholapaxy Right 10/13/2013    Procedure: CYSTOSCOPY WITH LITHOLAPAXY, removal of urethral stone with basket;  Surgeon: Irine Seal, MD;  Location: [facility];  Service: Urology;  Laterality: Right;  . Cystoscopy w/ retrogrades Bilateral 10/13/2013    Procedure: CYSTOSCOPY WITH RETROGRADE PYELOGRAM;  Surgeon: Irine Seal, MD;  Location: [facility];  Service: Urology;  Laterality: Bilateral;  . Lithotripsy  2007 & 02/2013  . Cystoscopy with retrograde pyelogram, ureteroscopy and stent placement Left 03/21/2014    Procedure: CYSTOSCOPY WITH RETROGRADE PYELOGRAM, AND LEFT STENT PLACEMENT;  Surgeon: Alexis Frock, MD;  Location: [facility];  Service: Urology;  Laterality: Left;  . Holmium laser application Left 81/27/5170    Procedure: HOLMIUM LASER APPLICATION;  Surgeon: Malka So, MD;  Location: [facility];  Service: Urology;  Laterality: Left;  . Cystoscopy w/ ureteral stent  placement Left 04/11/2014    Procedure: CYSTOSCOPY WITH STENT REPLACEMENT;  Surgeon: Malka So, MD;  Location: [facility];  Service: Urology;  Laterality: Left;  . Cystoscopy with ureteroscopy Left 04/11/2014    Procedure: CYSTOSCOPY WITH URETEROSCOPY/ STONE EXTRACTION;  Surgeon: Malka So, MD;  Location: [facility];  Service: Urology;  Laterality: Left;  . Peripheral vascular catheterization N/A 11/01/2014    Procedure:  Lower Extremity Angiography;  Surgeon: Serafina Mitchell, MD;  Location: [place] [facility];  Service: Cardiovascular;  Laterality: N/A;  . Abdominal aortagram  11/01/2014    Procedure: Abdominal Aortagram;  Surgeon: Serafina Mitchell, MD;  Location: [place] [facility];  Service: Cardiovascular;;  . Aorta - bilateral femoral artery bypass graft N/A 12/28/2014    Procedure: AORTOBIFEMORAL BYPASS GRAFT;  Surgeon: Serafina Mitchell, MD;  Location: [facility];  Service: Vascular;  Laterality: N/A;    Social History   Social History  . Marital Status: Married    Spouse Name: N/A  . Number of Children: 2  . Years of Education: N/A   Occupational History  . Not on file.   Social History Main Topics  . Smoking status: Current Every Day Smoker -- 2.00 packs/day for 50 years    Types: Cigarettes  . Smokeless tobacco: Never Used     Comment: PT HAS CUT DOWN TO 1PPD FROM 2 PPD LAST YR (2014)  . Alcohol Use: No  . Drug Use: No  . Sexual Activity: Not on file   Other Topics Concern  . Not on file   Social History Narrative   Patient is married with 2 children.   Patient is right handed.   Patient has some college education.   Patient does not drink caffeine.    Family History  Problem Relation Age of Onset  . Colon cancer Neg Hx   . Dementia Mother   . Hypertension Mother   . Dementia Father   . Hypertension Sister   . Stroke Mother 72    Allergies as of 02/19/2015 - Review Complete 02/19/2015  Allergen Reaction Noted  . Morphine and related Anxiety and Other (See Comments) 01/15/2015  . Tape Other (See Comments) and Rash 09/02/2013  . Amoxicillin Other (See Comments) 11/26/2012  . Ibuprofen Swelling 11/26/2012  . Naproxen Swelling 11/26/2012    Current Outpatient Prescriptions on File Prior to Visit  Medication Sig Dispense Refill  . acetaminophen (TYLENOL) 500 MG tablet Take 500 mg by mouth every 6 (six) hours as needed for mild pain or moderate pain.    Marland Kitchen aspirin EC 81 MG tablet  Take 81 mg by mouth daily.    . potassium citrate (UROCIT-K) 10 MEQ (1080 MG) SR tablet Take 10 mEq by mouth 3 (three) times daily with meals.    . cholecalciferol (VITAMIN D) 1000 UNITS tablet Take 1,000 Units by mouth daily.    . cilostazol (PLETAL) 100 MG tablet Take 1 tablet (100 mg total) by mouth 2 (two) times daily before a meal. (Patient not taking: Reported on 01/15/2015) 60 tablet 11  . Coenzyme Q10 (COQ10) 100 MG CAPS Take 1 capsule by mouth daily.    . Cyanocobalamin (VITAMIN B-12 PO) Take 1,000 mcg by mouth daily.     . fluticasone (FLONASE) 50 MCG/ACT nasal spray Place 2 sprays into the nose 2 (two) times daily.    Marland Kitchen loratadine (CLARITIN) 10 MG tablet Take 10 mg by mouth daily.    Marland Kitchen oxyCODONE (OXY  IR/ROXICODONE) 5 MG immediate release tablet Take 1 tablet (5 mg total) by mouth every 6 (six) hours as needed for moderate pain. (Patient not taking: Reported on 02/19/2015) 30 tablet 0  . varenicline (CHANTIX) 0.5 MG tablet Take 1 tablet (0.5 mg total) by mouth 2 (two) times daily. (Patient not taking: Reported on 02/19/2015) 60 tablet 3   No current facility-administered medications on file prior to visit.       PHYSICAL EXAMINATION:   Vital signs are  Filed Vitals:   02/19/15 1231  BP: 119/53  Pulse: 76  Temp: 97.3 F (36.3 C)  TempSrc: Oral  Height: '5\' 10"'$  (1.778 m)  Weight: 160 lb (72.576 kg)  SpO2: 97%   Body mass index is 22.96 kg/(m^2). General: The patient appears their stated age. HEENT:  No gross abnormalities Pulmonary:  Non labored breathing Abdomen:  All incisions are completely healed.  No evidence of midline hernia Musculoskeletal: There are no major deformities. Neurologic: No focal weakness or paresthesias are detected, Skin: There are no ulcer or rashes noted. Psychiatric: The patient has normal affect.    Diagnostic Studies  none  Assessment:  status post aortobifemoral bypass graft Plan:  the patient is doing very well. I'm giving him  clearance to go back to work with some restrictions.  He will follow up in 63 months with baseline ABIs.  Eldridge Abrahams, M.D. [facility] [place] Office: [phone] Pager:  [phone]

## 2015-02-20 NOTE — Addendum Note (Signed)
Addended by: Dorthula Rue L on: 02/20/2015 10:41 AM   Modules accepted: Orders

## 2015-02-27 DIAGNOSIS — E789 Disorder of lipoprotein metabolism, unspecified: Secondary | ICD-10-CM | POA: Diagnosis not present

## 2015-02-27 DIAGNOSIS — I739 Peripheral vascular disease, unspecified: Secondary | ICD-10-CM | POA: Diagnosis not present

## 2015-02-27 DIAGNOSIS — I10 Essential (primary) hypertension: Secondary | ICD-10-CM | POA: Diagnosis not present

## 2015-05-15 ENCOUNTER — Encounter: Payer: Self-pay | Admitting: Surgery

## 2015-05-15 DIAGNOSIS — B029 Zoster without complications: Secondary | ICD-10-CM

## 2015-05-15 HISTORY — DX: Zoster without complications: B02.9

## 2015-05-21 ENCOUNTER — Ambulatory Visit (HOSPITAL_COMMUNITY)
Admission: RE | Admit: 2015-05-21 | Discharge: 2015-05-21 | Disposition: A | Discharge status: Home / Self Care | Payer: BLUE CROSS/BLUE SHIELD | Source: Ambulatory Visit | Attending: Surgery | Admitting: Surgery

## 2015-05-21 ENCOUNTER — Encounter: Payer: Self-pay | Admitting: Surgery

## 2015-05-21 ENCOUNTER — Ambulatory Visit (INDEPENDENT_AMBULATORY_CARE_PROVIDER_SITE_OTHER): Payer: BLUE CROSS/BLUE SHIELD | Admitting: Surgery

## 2015-05-21 VITALS — BP 125/82 | HR 81 | Temp 98.1°F | Resp 18 | Ht 69.5 in | Wt 173.6 lb

## 2015-05-21 DIAGNOSIS — I70219 Atherosclerosis of native arteries of extremities with intermittent claudication, unspecified extremity: Secondary | ICD-10-CM | POA: Diagnosis not present

## 2015-05-21 DIAGNOSIS — I129 Hypertensive chronic kidney disease with stage 1 through stage 4 chronic kidney disease, or unspecified chronic kidney disease: Secondary | ICD-10-CM | POA: Diagnosis not present

## 2015-05-21 DIAGNOSIS — N189 Chronic kidney disease, unspecified: Secondary | ICD-10-CM | POA: Insufficient documentation

## 2015-05-21 DIAGNOSIS — I70213 Atherosclerosis of native arteries of extremities with intermittent claudication, bilateral legs: Secondary | ICD-10-CM | POA: Diagnosis not present

## 2015-05-21 NOTE — Progress Notes (Signed)
Patient name: Chad Gomez MRN: [id number] DOB: 30-Oct-1945 Sex: male     Chief Complaint  Patient presents with  . Follow-up    3 month FU with carotid duplex  . PVD    HISTORY OF PRESENT ILLNESS: The patient is back for follow-up. He is status post aortobifemoral bypass graft using a 16 x 8 bifurcated dacryon graft on 12/29/2014. This was done for left iliac occlusion. The patient's postoperative course was uncomplicated. history of complaints today are that of numbness and occasional discomfort along the left medial thigh as well as retrograde ejaculation.  The patient was recently diagnosed with shingles and is being treated.  Past Medical History  Diagnosis Date  . Hypertension   . Iron deficiency   . History of bladder stone   . Seasonal allergies   . BPH (benign prostatic hypertrophy)   . Right ureteral stone   . History of melanoma excision     LEFT EAR  . History of colon polyps     08/2013   pre-canerous  . Complication of anesthesia     urinary retention  . Chronic kidney disease   . Peripheral vascular disease ([place])   . Cough   . Shingles 05-15-2015    Past Surgical History  Procedure Laterality Date  . Cystolithalopaxy of bladder stone/ transrectal ultrasound prostate bx  08-19-2005  . Cystoscopy with litholapaxy Right 12/02/2012    Procedure: CYSTOSCOPY WITH stone extraction;  Surgeon: Malka So, MD;  Location: [facility];  Service: Urology;  Laterality: Right;  . Ureteroscopy Right 12/02/2012    Procedure: RIGHT URETEROSCOPY STONE EXTRACTION WITH  STENT PLACEMENT;  Surgeon: Malka So, MD;  Location: [facility];  Service: Urology;  Laterality: Right;  . Holmium laser application Right 02/02/7516    Procedure: HOLMIUM LASER APPLICATION;  Surgeon: Malka So, MD;  Location: [facility];  Service: Urology;  Laterality: Right;  . Cystoscopy w/ retrogrades Right 12/02/2012    Procedure: CYSTOSCOPY  WITH RETROGRADE PYELOGRAM;  Surgeon: Malka So, MD;  Location: [facility];  Service: Urology;  Laterality: Right;  . Nephrolithotomy Left 01/27/2013    Procedure: NEPHROLITHOTOMY PERCUTANEOUS FIRST LOOK;  Surgeon: Malka So, MD;  Location: [facility];  Service: Urology;  Laterality: Left;  Marland Kitchen Mohs surgery  1996    left ear  . Multiple tooth extractions  1994  . Mandible fracture surgery  1995  . Inguinal hernia repair Right 1954  . Colonoscopy w/ polypectomy  09-13-2013  . Cystoscopy with litholapaxy Right 10/13/2013    Procedure: CYSTOSCOPY WITH LITHOLAPAXY, removal of urethral stone with basket;  Surgeon: Irine Seal, MD;  Location: [facility];  Service: Urology;  Laterality: Right;  . Cystoscopy w/ retrogrades Bilateral 10/13/2013    Procedure: CYSTOSCOPY WITH RETROGRADE PYELOGRAM;  Surgeon: Irine Seal, MD;  Location: [facility];  Service: Urology;  Laterality: Bilateral;  . Lithotripsy  2007 & 02/2013  . Cystoscopy with retrograde pyelogram, ureteroscopy and stent placement Left 03/21/2014    Procedure: CYSTOSCOPY WITH RETROGRADE PYELOGRAM, AND LEFT STENT PLACEMENT;  Surgeon: Alexis Frock, MD;  Location: [facility];  Service: Urology;  Laterality: Left;  . Holmium laser application Left 00/17/4944    Procedure: HOLMIUM LASER APPLICATION;  Surgeon: Malka So, MD;  Location: [facility];  Service: Urology;  Laterality: Left;  . Cystoscopy w/ ureteral stent placement Left 04/11/2014    Procedure: CYSTOSCOPY WITH  STENT REPLACEMENT;  Surgeon: Malka So, MD;  Location: [facility];  Service: Urology;  Laterality: Left;  . Cystoscopy with ureteroscopy Left 04/11/2014    Procedure: CYSTOSCOPY WITH URETEROSCOPY/ STONE EXTRACTION;  Surgeon: Malka So, MD;  Location: [facility];  Service: Urology;  Laterality: Left;  . Peripheral vascular catheterization N/A 11/01/2014    Procedure: Lower Extremity  Angiography;  Surgeon: Serafina Mitchell, MD;  Location: [place] [facility];  Service: Cardiovascular;  Laterality: N/A;  . Abdominal aortagram  11/01/2014    Procedure: Abdominal Aortagram;  Surgeon: Serafina Mitchell, MD;  Location: [place] [facility];  Service: Cardiovascular;;  . Aorta - bilateral femoral artery bypass graft N/A 12/28/2014    Procedure: AORTOBIFEMORAL BYPASS GRAFT;  Surgeon: Serafina Mitchell, MD;  Location: [facility];  Service: Vascular;  Laterality: N/A;    Social History   Social History  . Marital Status: Married    Spouse Name: N/A  . Number of Children: 2  . Years of Education: N/A   Occupational History  . Not on file.   Social History Main Topics  . Smoking status: Current Every Day Smoker -- 2.00 packs/day for 50 years    Types: Cigarettes  . Smokeless tobacco: Never Used     Comment: PT HAS CUT DOWN TO 1PPD FROM 2 PPD LAST YR (2014)  . Alcohol Use: No  . Drug Use: No  . Sexual Activity: Not on file   Other Topics Concern  . Not on file   Social History Narrative   Patient is married with 2 children.   Patient is right handed.   Patient has some college education.   Patient does not drink caffeine.    Family History  Problem Relation Age of Onset  . Colon cancer Neg Hx   . Dementia Mother   . Hypertension Mother   . Dementia Father   . Hypertension Sister   . Stroke Mother 37    Allergies as of 05/21/2015 - Review Complete 05/21/2015  Allergen Reaction Noted  . Morphine and related Anxiety and Other (See Comments) 01/15/2015  . Tape Other (See Comments) and Rash 09/02/2013  . Amoxicillin Other (See Comments) 11/26/2012  . Ibuprofen Swelling 11/26/2012  . Naproxen Swelling 11/26/2012    Current Outpatient Prescriptions on File Prior to Visit  Medication Sig Dispense Refill  . amLODipine (NORVASC) 10 MG tablet Take 10 mg by mouth daily.    Marland Kitchen aspirin EC 81 MG tablet Take 81 mg by mouth daily.    . indapamide (LOZOL) 2.5 MG tablet Take 2.5 mg  by mouth daily.  3  . potassium citrate (UROCIT-K) 10 MEQ (1080 MG) SR tablet Take 10 mEq by mouth 3 (three) times daily with meals.    . pravastatin (PRAVACHOL) 20 MG tablet Take 20 mg by mouth daily.    Marland Kitchen acetaminophen (TYLENOL) 500 MG tablet Take 500 mg by mouth every 6 (six) hours as needed for mild pain or moderate pain.    . cholecalciferol (VITAMIN D) 1000 UNITS tablet Take 1,000 Units by mouth daily.    . cilostazol (PLETAL) 100 MG tablet Take 1 tablet (100 mg total) by mouth 2 (two) times daily before a meal. (Patient not taking: Reported on 01/15/2015) 60 tablet 11  . Coenzyme Q10 (COQ10) 100 MG CAPS Take 1 capsule by mouth daily.    . Cyanocobalamin (VITAMIN B-12 PO) Take 1,000 mcg by mouth daily.     . fluticasone (  FLONASE) 50 MCG/ACT nasal spray Place 2 sprays into the nose 2 (two) times daily.    Marland Kitchen loratadine (CLARITIN) 10 MG tablet Take 10 mg by mouth daily.    Marland Kitchen oxyCODONE (OXY IR/ROXICODONE) 5 MG immediate release tablet Take 1 tablet (5 mg total) by mouth every 6 (six) hours as needed for moderate pain. (Patient not taking: Reported on 02/19/2015) 30 tablet 0  . varenicline (CHANTIX) 0.5 MG tablet Take 1 tablet (0.5 mg total) by mouth 2 (two) times daily. (Patient not taking: Reported on 02/19/2015) 60 tablet 3   No current facility-administered medications on file prior to visit.     REVIEW OF SYSTEMS:  see history of present illness otherwise negative  PHYSICAL EXAMINATION:   Vital signs are  Filed Vitals:   05/21/15 1209  BP: 125/82  Pulse: 81  Temp: 98.1 F (36.7 C)  TempSrc: Oral  Resp: 18  Height: 5' 9.5" (1.765 m)  Weight: 173 lb 9.6 oz (78.744 kg)  SpO2: 96%   Body mass index is 25.28 kg/(m^2). General: The patient appears their stated age. HEENT:  No gross abnormalities Pulmonary:  Non labored breathing Abdomen: Soft and non-tender midline incision well healed without hernia Musculoskeletal: There are no major deformities. Neurologic: No focal weakness  or paresthesias are detected, Skin: There are no ulcer or rashes noted. Psychiatric: The patient has normal affect. Cardiovascular: There is a regular rate and rhythm without significant murmur appreciated.   Diagnostic Studies  Doppler studies were performed today.  ABI on the right is 1.01 dear on the left is 1.08.  Previously the right was 0.88 in the left was 0.65  Assessment:  status post aortobifemoral bypass graft Plan:  I discussed the etiology of retrograde ejaculation. I doubt this will change but have reassured him. I'm hopeful that the left inner thigh graft fraction will resolve with time.  The patient's claudication symptoms have resolved. Overall he is doing very well.  I'll have him follow-up in one year with ABIs.  Eldridge Abrahams, M.D. [facility] [place] Office: [phone] Pager:  [phone]

## 2015-05-21 NOTE — Addendum Note (Signed)
Addended by: Dorthula Rue L on: 05/21/2015 01:24 PM   Modules accepted: Orders

## 2015-06-11 ENCOUNTER — Ambulatory Visit: Payer: BLUE CROSS/BLUE SHIELD | Admitting: Surgery

## 2015-06-11 ENCOUNTER — Encounter (HOSPITAL_COMMUNITY): Payer: BLUE CROSS/BLUE SHIELD

## 2015-07-06 DIAGNOSIS — B0230 Zoster ocular disease, unspecified: Secondary | ICD-10-CM | POA: Diagnosis not present

## 2015-07-06 DIAGNOSIS — H25013 Cortical age-related cataract, bilateral: Secondary | ICD-10-CM | POA: Diagnosis not present

## 2015-07-06 DIAGNOSIS — H10501 Unspecified blepharoconjunctivitis, right eye: Secondary | ICD-10-CM | POA: Diagnosis not present

## 2015-07-06 DIAGNOSIS — H2513 Age-related nuclear cataract, bilateral: Secondary | ICD-10-CM | POA: Diagnosis not present

## 2015-08-03 DIAGNOSIS — E7800 Pure hypercholesterolemia, unspecified: Secondary | ICD-10-CM | POA: Diagnosis not present

## 2015-08-10 ENCOUNTER — Encounter: Payer: Self-pay | Admitting: Registered Nurse

## 2015-08-21 DIAGNOSIS — R5383 Other fatigue: Secondary | ICD-10-CM | POA: Diagnosis not present

## 2015-08-21 DIAGNOSIS — E7800 Pure hypercholesterolemia, unspecified: Secondary | ICD-10-CM | POA: Diagnosis not present

## 2015-08-21 DIAGNOSIS — L820 Inflamed seborrheic keratosis: Secondary | ICD-10-CM | POA: Diagnosis not present

## 2015-12-04 ENCOUNTER — Other Ambulatory Visit (HOSPITAL_COMMUNITY): Payer: Self-pay | Admitting: Respiratory Therapy

## 2015-12-04 DIAGNOSIS — R05 Cough: Secondary | ICD-10-CM

## 2015-12-04 DIAGNOSIS — R0981 Nasal congestion: Secondary | ICD-10-CM | POA: Diagnosis not present

## 2015-12-04 DIAGNOSIS — Z23 Encounter for immunization: Secondary | ICD-10-CM | POA: Diagnosis not present

## 2015-12-04 DIAGNOSIS — E7800 Pure hypercholesterolemia, unspecified: Secondary | ICD-10-CM | POA: Diagnosis not present

## 2015-12-04 DIAGNOSIS — R059 Cough, unspecified: Secondary | ICD-10-CM

## 2015-12-06 ENCOUNTER — Institutional Professional Consult (permissible substitution): Payer: BLUE CROSS/BLUE SHIELD | Admitting: Pulmonary Disease

## 2015-12-13 ENCOUNTER — Ambulatory Visit (HOSPITAL_COMMUNITY)
Admission: RE | Admit: 2015-12-13 | Discharge: 2015-12-13 | Disposition: A | Discharge status: Home / Self Care | Payer: Medicare Other | Source: Ambulatory Visit | Attending: Internal Medicine | Admitting: Internal Medicine

## 2015-12-13 DIAGNOSIS — R059 Cough, unspecified: Secondary | ICD-10-CM

## 2015-12-13 DIAGNOSIS — R05 Cough: Secondary | ICD-10-CM | POA: Insufficient documentation

## 2015-12-13 LAB — SPIROMETRY WITH GRAPH
FEF 25-75 PRE: 0.35 L/s
FEF2575-%Pred-Pre: 14 %
FEV1-%PRED-PRE: 29 %
FEV1-PRE: 0.95 L
FEV1FVC-%PRED-PRE: 52 %
FEV6-%Pred-Pre: 52 %
FEV6-PRE: 2.12 L
FEV6FVC-%Pred-Pre: 91 %
FVC-%PRED-PRE: 57 %
FVC-Pre: 2.47 L
Pre FEV1/FVC ratio: 39 %
Pre FEV6/FVC Ratio: 86 %

## 2016-01-01 DIAGNOSIS — J381 Polyp of vocal cord and larynx: Secondary | ICD-10-CM | POA: Diagnosis not present

## 2016-01-01 DIAGNOSIS — R05 Cough: Secondary | ICD-10-CM | POA: Diagnosis not present

## 2016-01-02 ENCOUNTER — Other Ambulatory Visit: Payer: Self-pay | Admitting: Acute Care

## 2016-01-02 ENCOUNTER — Institutional Professional Consult (permissible substitution): Payer: BLUE CROSS/BLUE SHIELD | Admitting: Pulmonary Disease

## 2016-01-02 DIAGNOSIS — F17210 Nicotine dependence, cigarettes, uncomplicated: Secondary | ICD-10-CM

## 2016-01-03 ENCOUNTER — Other Ambulatory Visit: Payer: Self-pay | Admitting: Otolaryngology

## 2016-01-15 DIAGNOSIS — E7800 Pure hypercholesterolemia, unspecified: Secondary | ICD-10-CM | POA: Diagnosis not present

## 2016-01-17 DIAGNOSIS — I10 Essential (primary) hypertension: Secondary | ICD-10-CM | POA: Diagnosis not present

## 2016-01-17 DIAGNOSIS — E7800 Pure hypercholesterolemia, unspecified: Secondary | ICD-10-CM | POA: Diagnosis not present

## 2016-01-31 ENCOUNTER — Encounter: Payer: BLUE CROSS/BLUE SHIELD | Admitting: Acute Care

## 2016-01-31 ENCOUNTER — Inpatient Hospital Stay: Admission: RE | Admit: 2016-01-31 | Payer: BLUE CROSS/BLUE SHIELD | Source: Ambulatory Visit

## 2016-01-31 DIAGNOSIS — R0989 Other specified symptoms and signs involving the circulatory and respiratory systems: Secondary | ICD-10-CM

## 2016-02-05 ENCOUNTER — Encounter (HOSPITAL_BASED_OUTPATIENT_CLINIC_OR_DEPARTMENT_OTHER): Payer: Self-pay | Admitting: *Deleted

## 2016-02-08 DIAGNOSIS — K122 Cellulitis and abscess of mouth: Secondary | ICD-10-CM | POA: Diagnosis not present

## 2016-02-11 ENCOUNTER — Other Ambulatory Visit: Payer: Self-pay

## 2016-02-11 ENCOUNTER — Other Ambulatory Visit: Payer: Self-pay | Admitting: Otolaryngology

## 2016-02-11 DIAGNOSIS — F17210 Nicotine dependence, cigarettes, uncomplicated: Secondary | ICD-10-CM | POA: Diagnosis not present

## 2016-02-11 DIAGNOSIS — N189 Chronic kidney disease, unspecified: Secondary | ICD-10-CM | POA: Diagnosis not present

## 2016-02-11 DIAGNOSIS — Z85820 Personal history of malignant melanoma of skin: Secondary | ICD-10-CM | POA: Diagnosis not present

## 2016-02-11 DIAGNOSIS — D1809 Hemangioma of other sites: Secondary | ICD-10-CM | POA: Diagnosis not present

## 2016-02-11 DIAGNOSIS — I129 Hypertensive chronic kidney disease with stage 1 through stage 4 chronic kidney disease, or unspecified chronic kidney disease: Secondary | ICD-10-CM | POA: Diagnosis not present

## 2016-02-11 DIAGNOSIS — J382 Nodules of vocal cords: Secondary | ICD-10-CM | POA: Diagnosis present

## 2016-02-11 DIAGNOSIS — Z7982 Long term (current) use of aspirin: Secondary | ICD-10-CM | POA: Diagnosis not present

## 2016-02-11 DIAGNOSIS — Z79899 Other long term (current) drug therapy: Secondary | ICD-10-CM | POA: Diagnosis not present

## 2016-02-15 DIAGNOSIS — L03211 Cellulitis of face: Secondary | ICD-10-CM | POA: Diagnosis not present

## 2016-02-15 DIAGNOSIS — L0201 Cutaneous abscess of face: Secondary | ICD-10-CM | POA: Diagnosis not present

## 2016-02-18 ENCOUNTER — Encounter (HOSPITAL_BASED_OUTPATIENT_CLINIC_OR_DEPARTMENT_OTHER): Payer: Self-pay | Admitting: *Deleted

## 2016-02-18 ENCOUNTER — Institutional Professional Consult (permissible substitution): Payer: BLUE CROSS/BLUE SHIELD | Admitting: Pulmonary Disease

## 2016-02-18 ENCOUNTER — Ambulatory Visit (HOSPITAL_BASED_OUTPATIENT_CLINIC_OR_DEPARTMENT_OTHER): Payer: Medicare Other | Admitting: Anesthesiology

## 2016-02-18 ENCOUNTER — Ambulatory Visit (HOSPITAL_BASED_OUTPATIENT_CLINIC_OR_DEPARTMENT_OTHER)
Admission: RE | Admit: 2016-02-18 | Discharge: 2016-02-18 | Disposition: A | Discharge status: Home / Self Care | Payer: Medicare Other | Source: Ambulatory Visit | Attending: Otolaryngology | Admitting: Otolaryngology

## 2016-02-18 ENCOUNTER — Encounter (HOSPITAL_BASED_OUTPATIENT_CLINIC_OR_DEPARTMENT_OTHER)
Admission: RE | Disposition: A | Discharge status: Home / Self Care | Payer: Self-pay | Source: Ambulatory Visit | Attending: Otolaryngology

## 2016-02-18 DIAGNOSIS — J383 Other diseases of vocal cords: Secondary | ICD-10-CM | POA: Diagnosis not present

## 2016-02-18 DIAGNOSIS — Z85820 Personal history of malignant melanoma of skin: Secondary | ICD-10-CM | POA: Diagnosis not present

## 2016-02-18 DIAGNOSIS — Z79899 Other long term (current) drug therapy: Secondary | ICD-10-CM | POA: Insufficient documentation

## 2016-02-18 DIAGNOSIS — N189 Chronic kidney disease, unspecified: Secondary | ICD-10-CM | POA: Insufficient documentation

## 2016-02-18 DIAGNOSIS — Z7982 Long term (current) use of aspirin: Secondary | ICD-10-CM | POA: Insufficient documentation

## 2016-02-18 DIAGNOSIS — F17210 Nicotine dependence, cigarettes, uncomplicated: Secondary | ICD-10-CM | POA: Diagnosis not present

## 2016-02-18 DIAGNOSIS — I129 Hypertensive chronic kidney disease with stage 1 through stage 4 chronic kidney disease, or unspecified chronic kidney disease: Secondary | ICD-10-CM | POA: Diagnosis not present

## 2016-02-18 DIAGNOSIS — D1809 Hemangioma of other sites: Secondary | ICD-10-CM | POA: Insufficient documentation

## 2016-02-18 DIAGNOSIS — D380 Neoplasm of uncertain behavior of larynx: Secondary | ICD-10-CM | POA: Diagnosis not present

## 2016-02-18 DIAGNOSIS — I739 Peripheral vascular disease, unspecified: Secondary | ICD-10-CM | POA: Diagnosis not present

## 2016-02-18 HISTORY — PX: MICROLARYNGOSCOPY: SHX5208

## 2016-02-18 HISTORY — DX: Hyperlipidemia, unspecified: E78.5

## 2016-02-18 SURGERY — MICROLARYNGOSCOPY
Anesthesia: General | Site: Throat | Laterality: Right

## 2016-02-18 MED ORDER — LACTATED RINGERS IV SOLN
INTRAVENOUS | Status: DC
  Administered 2016-02-18: 10:00:00 via INTRAVENOUS

## 2016-02-18 MED ORDER — LIDOCAINE 2% (20 MG/ML) 5 ML SYRINGE
INTRAMUSCULAR | Status: DC | PRN
  Administered 2016-02-18: 60 mg via INTRAVENOUS
  Administered 2016-02-18: 40 mg via INTRAVENOUS

## 2016-02-18 MED ORDER — PROPOFOL 10 MG/ML IV BOLUS
INTRAVENOUS | Status: DC | PRN
  Administered 2016-02-18: 150 mg via INTRAVENOUS

## 2016-02-18 MED ORDER — OXYCODONE HCL 5 MG PO TABS: 5.0000 mg | ORAL_TABLET | Freq: Once | ORAL | Status: DC | PRN

## 2016-02-18 MED ORDER — ARTIFICIAL TEARS OP OINT
TOPICAL_OINTMENT | OPHTHALMIC | Status: AC
  Filled 2016-02-18: qty 3.5

## 2016-02-18 MED ORDER — DEXAMETHASONE SODIUM PHOSPHATE 10 MG/ML IJ SOLN
INTRAMUSCULAR | Status: AC
  Filled 2016-02-18: qty 1

## 2016-02-18 MED ORDER — ONDANSETRON HCL 4 MG/2ML IJ SOLN
INTRAMUSCULAR | Status: AC
  Filled 2016-02-18: qty 2

## 2016-02-18 MED ORDER — FENTANYL CITRATE (PF) 100 MCG/2ML IJ SOLN: 50.0000 ug | INTRAMUSCULAR | Status: DC | PRN

## 2016-02-18 MED ORDER — OXYCODONE HCL 5 MG/5ML PO SOLN: 5.0000 mg | Freq: Once | ORAL | Status: DC | PRN

## 2016-02-18 MED ORDER — FENTANYL CITRATE (PF) 100 MCG/2ML IJ SOLN
INTRAMUSCULAR | Status: AC
  Filled 2016-02-18: qty 2

## 2016-02-18 MED ORDER — GLYCOPYRROLATE 0.2 MG/ML IJ SOLN: 0.2000 mg | Freq: Once | INTRAMUSCULAR | Status: DC | PRN

## 2016-02-18 MED ORDER — EPINEPHRINE HCL 1 MG/ML IJ SOLN
INTRAMUSCULAR | Status: DC | PRN
  Administered 2016-02-18: 1 mL

## 2016-02-18 MED ORDER — LIDOCAINE 2% (20 MG/ML) 5 ML SYRINGE
INTRAMUSCULAR | Status: AC
  Filled 2016-02-18: qty 5

## 2016-02-18 MED ORDER — FENTANYL CITRATE (PF) 100 MCG/2ML IJ SOLN
25.0000 ug | INTRAMUSCULAR | Status: DC | PRN
  Administered 2016-02-18: 100 ug via INTRAVENOUS

## 2016-02-18 MED ORDER — ONDANSETRON HCL 4 MG/2ML IJ SOLN
4.0000 mg | Freq: Four times a day (QID) | INTRAMUSCULAR | Status: AC | PRN
  Administered 2016-02-18: 4 mg via INTRAVENOUS

## 2016-02-18 MED ORDER — OMEPRAZOLE 20 MG PO CPDR: 20.0000 mg | DELAYED_RELEASE_CAPSULE | Freq: Every day | ORAL | 5 refills | Status: DC

## 2016-02-18 MED ORDER — SUCCINYLCHOLINE CHLORIDE 200 MG/10ML IV SOSY
PREFILLED_SYRINGE | INTRAVENOUS | Status: DC | PRN
  Administered 2016-02-18: 100 mg via INTRAVENOUS

## 2016-02-18 MED ORDER — SUCCINYLCHOLINE CHLORIDE 200 MG/10ML IV SOSY
PREFILLED_SYRINGE | INTRAVENOUS | Status: AC
  Filled 2016-02-18: qty 10

## 2016-02-18 MED ORDER — SCOPOLAMINE 1 MG/3DAYS TD PT72: 1.0000 | MEDICATED_PATCH | Freq: Once | TRANSDERMAL | Status: DC | PRN

## 2016-02-18 MED ORDER — DEXAMETHASONE SODIUM PHOSPHATE 4 MG/ML IJ SOLN
INTRAMUSCULAR | Status: DC | PRN
  Administered 2016-02-18: 10 mg via INTRAVENOUS

## 2016-02-18 MED ORDER — LIDOCAINE-EPINEPHRINE 1 %-1:100000 IJ SOLN
INTRAMUSCULAR | Status: AC
  Filled 2016-02-18: qty 1

## 2016-02-18 MED ORDER — MIDAZOLAM HCL 2 MG/2ML IJ SOLN: 1.0000 mg | INTRAMUSCULAR | Status: DC | PRN

## 2016-02-18 SURGICAL SUPPLY — 20 items
CANISTER SUCT 1200ML W/VALVE (MISCELLANEOUS) ×3 IMPLANT
GLOVE BIO SURGEON STRL SZ7.5 (GLOVE) ×3 IMPLANT
GLOVE SURG SS PI 7.0 STRL IVOR (GLOVE) ×2 IMPLANT
GOWN STRL REUS W/ TWL LRG LVL3 (GOWN DISPOSABLE) ×1 IMPLANT
GOWN STRL REUS W/TWL LRG LVL3 (GOWN DISPOSABLE) ×3
GUARD TEETH (MISCELLANEOUS) ×3 IMPLANT
MARKER SKIN DUAL TIP RULER LAB (MISCELLANEOUS) ×3 IMPLANT
NEEDLE HYPO 18GX1.5 BLUNT FILL (NEEDLE) ×3 IMPLANT
NEEDLE SPNL 22GX7 QUINCKE BK (NEEDLE) IMPLANT
NS IRRIG 1000ML POUR BTL (IV SOLUTION) ×3 IMPLANT
PATTIES SURGICAL .5 X3 (DISPOSABLE) ×3 IMPLANT
SHEET MEDIUM DRAPE 40X70 STRL (DRAPES) ×3 IMPLANT
SLEEVE SCD COMPRESS KNEE MED (MISCELLANEOUS) ×3 IMPLANT
SOLUTION BUTLER CLEAR DIP (MISCELLANEOUS) ×3 IMPLANT
SPONGE GAUZE 4X4 12PLY STER LF (GAUZE/BANDAGES/DRESSINGS) ×3 IMPLANT
SYR CONTROL 10ML LL (SYRINGE) ×3 IMPLANT
SYR TB 1ML LL NO SAFETY (SYRINGE) ×3 IMPLANT
TOWEL OR 17X24 6PK STRL BLUE (TOWEL DISPOSABLE) ×3 IMPLANT
TUBE CONNECTING 20'X1/4 (TUBING) ×1
TUBE CONNECTING 20X1/4 (TUBING) ×2 IMPLANT

## 2016-02-18 NOTE — Anesthesia Postprocedure Evaluation (Signed)
Anesthesia Post Note  Patient: Chad Gomez  Procedure(s) Performed: Procedure(s) (LRB): MICRO DIRECT LARYNGOSCOPY EXCISIONAL BIOPSY OF RIGHT VOCAL CORD MASS (Right)  Patient location during evaluation: PACU Anesthesia Type: General Level of consciousness: awake Pain management: pain level controlled Vital Signs Assessment: post-procedure vital signs reviewed and stable Respiratory status: spontaneous breathing Cardiovascular status: stable Postop Assessment: no signs of nausea or vomiting Anesthetic complications: no    Last Vitals:  Vitals:   02/18/16 1200 02/18/16 1224  BP: 118/79 133/85  Pulse: 84 88  Resp: 17 18  Temp:  36.7 C    Last Pain:  Vitals:   02/18/16 1224  TempSrc:   PainSc: 0-No pain                 Kanna Dafoe

## 2016-02-18 NOTE — H&P (Signed)
Cc: Chronic cough  HPI: The patient is a 70 y/o male who presents today with his wife for evaluation of post nasal drainage and cough. The patient is seen in consultation requested by Dr. [name]. According to the patient, each winter he develops nasal drainage and cough, but his symptoms usually improve by May. This year the drainage and cough have persisted. The patient has taken several different allergy and cough medications with little relief. He currently denies any significant hoarseness, dysphagia, or odynophagia. The patient currently smokes 2 pack per day and has been smoking for 56+ years. Previous ENT surgery is denied.   The patient's review of systems (constitutional, eyes, ENT, cardiovascular, respiratory, GI, musculoskeletal, skin, neurologic, psychiatric, endocrine, hematologic, allergic) is noted in the ROS questionnaire.  It is reviewed with the patient.   Family health history: Colon cancer, dementia, hypertension, stroke.   Major events: Prostate Bx, Cystoscopy with litholapaxy, Mohs surgery, multiple dental surgery, mandible fracture, bilateral femoral artery bypass graft, Nephrolithotomy, hernia repair.   Ongoing medical problems: Hypertension, iron deficiency, allergies, benign prostatic hypertrophy, excision of melanoma right ear, chronic kidney disease, shingles.   Social history: The patient is married. He smokes two packs of cigarettes for 50 years. He denies the use of alcohol or illegal drugs.  Exam General: Communicates without difficulty, well nourished, no acute distress. Head: Normocephalic, no evidence injury, no tenderness, facial buttresses intact without stepoff. Eyes: PERRL, EOMI.  No scleral icterus, conjunctivae clear. Ears: External auditory canals clear bilaterally.  There is no edema or erythema.  Tympanic membrane is within normal limits bilaterally. Nose: Normal skin and external support.  Anterior rhinoscopy reveals healthy pink mucosa over the  septum and turbinates.  No lesions or polyps were seen. Oral cavity: Lips without lesions, oral mucosa moist, no masses or lesions seen. Indirect  mirror laryngoscopy could not be tolerated. Pharynx: Clear, no erythema. Neck: Supple, full range of motion, no lymphadenopathy, no masses palpable. Salivary: Parotid and submandibular glands without mass. Neuro:  CN 2-12 grossly intact. Gait normal. Vestibular: No nystagmus at any point of gaze.   Procedure:  Flexible Fiberoptic Laryngoscopy -- Risks, benefits, and alternatives of flexible endoscopy were explained to the patient.  Specific mention was made of the risk of throat numbness with difficulty swallowing, possible bleeding from the nose and mouth, and pain from the procedure.  The patient gave oral consent to proceed.  The nasal cavities were decongested and anesthetised with a combination of oxymetazoline and 4% lidocaine solution.  The flexible scope was inserted into the right nasal cavity and advanced towards the nasopharynx.  Visualized mucosa over the turbinates and septum were as described above.  The nasopharynx was clear.  Oropharyngeal walls were symmetric and mobile without lesion, mass, or edema.  Hypopharynx was also without  lesion or edema.  Larynx was mobile without lesions. Supraglottic structures were free of edema, mass, and asymmetry.  True vocal folds were white with a hemorrhagic nodule noted on the right.  Base of tongue was within normal limits.  The patient tolerated the procedure well.   Assessment 1.  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 No other suspicious mass or lesion is noted on today's fiberoptic laryngoscopy exam.  Plan  1.  Laryngoscopy findings are reviewed with the patient and his wife.  2.  Recommend direct laryngoscopy with excision of the right vocal cord nodule. The risks, benefits,  alternatives, and details of the  procedure are reviewed with the patient. Questions are invited and answered. 3.  The patient is interested in proceeding with the procedure.  We will schedule the procedure in accordance with the family schedule.  4.  The patient is placed on neurontin 300 mg qhs to help with his cough.

## 2016-02-18 NOTE — Discharge Instructions (Addendum)
The patient may resume all his previous activities and diet.   Post Anesthesia Home Care Instructions  Activity: Get plenty of rest for the remainder of the day. A responsible adult should stay with you for 24 hours following the procedure.  For the next 24 hours, DO NOT: -Drive a car -Paediatric nurse -Drink alcoholic beverages -Take any medication unless instructed by your physician -Make any legal decisions or sign important papers.  Meals: Start with liquid foods such as gelatin or soup. Progress to regular foods as tolerated. Avoid greasy, spicy, heavy foods. If nausea and/or vomiting occur, drink only clear liquids until the nausea and/or vomiting subsides. Call your physician if vomiting continues.  Special Instructions/Symptoms: Your throat may feel dry or sore from the anesthesia or the breathing tube placed in your throat during surgery. If this causes discomfort, gargle with warm salt water. The discomfort should disappear within 24 hours.  If you had a scopolamine patch placed behind your ear for the management of post- operative nausea and/or vomiting:  1. The medication in the patch is effective for 72 hours, after which it should be removed.  Wrap patch in a tissue and discard in the trash. Wash hands thoroughly with soap and water. 2. You may remove the patch earlier than 72 hours if you experience unpleasant side effects which may include dry mouth, dizziness or visual disturbances. 3. Avoid touching the patch. Wash your hands with soap and water after contact with the patch.

## 2016-02-18 NOTE — Anesthesia Preprocedure Evaluation (Signed)
Anesthesia Evaluation  Patient identified by MRN, date of birth, ID band Patient awake    Reviewed: Allergy & Precautions, H&P , NPO status , Patient's Chart, lab work & pertinent test results  Airway Mallampati: II   Neck ROM: full    Dental   Pulmonary Current Smoker,    breath sounds clear to auscultation       Cardiovascular hypertension, + Peripheral Vascular Disease   Rhythm:regular Rate:Normal     Neuro/Psych  Neuromuscular disease    GI/Hepatic   Endo/Other    Renal/GU Renal disease     Musculoskeletal   Abdominal   Peds  Hematology   Anesthesia Other Findings   Reproductive/Obstetrics                             Anesthesia Physical Anesthesia Plan  ASA: III  Anesthesia Plan: General   Post-op Pain Management:    Induction: Intravenous  Airway Management Planned: Oral ETT  Additional Equipment:   Intra-op Plan:   Post-operative Plan: Extubation in OR  Informed Consent: I have reviewed the patients History and Physical, chart, labs and discussed the procedure including the risks, benefits and alternatives for the proposed anesthesia with the patient or authorized representative who has indicated his/her understanding and acceptance.     Plan Discussed with: CRNA, Anesthesiologist and Surgeon  Anesthesia Plan Comments:         Anesthesia Quick Evaluation

## 2016-02-18 NOTE — Anesthesia Procedure Notes (Signed)
Procedure Name: Intubation Date/Time: 02/18/2016 10:32 AM Performed by: Lyndee Leo Pre-anesthesia Checklist: Patient identified, Emergency Drugs available, Suction available and Patient being monitored Patient Re-evaluated:Patient Re-evaluated prior to inductionOxygen Delivery Method: Circle system utilized and Non-rebreather mask Preoxygenation: Pre-oxygenation with 100% oxygen Intubation Type: IV induction Ventilation: Mask ventilation without difficulty Laryngoscope Size: Miller and 3 Grade View: Grade II Tube type: Oral Tube size: 6.0 mm Number of attempts: 1 Airway Equipment and Method: Stylet and Oral airway Placement Confirmation: ETT inserted through vocal cords under direct vision,  positive ETCO2 and breath sounds checked- equal and bilateral Tube secured with: Tape Dental Injury: Teeth and Oropharynx as per pre-operative assessment

## 2016-02-18 NOTE — Op Note (Signed)
DATE OF PROCEDURE:  02/18/2016                              OPERATIVE REPORT  SURGEON:  Leta Baptist, MD  PREOPERATIVE DIAGNOSES: 1. Right vocal cord nodule  POSTOPERATIVE DIAGNOSES: 1. Right vocal cord nodule  PROCEDURE PERFORMED:  MicroDirect laryngoscopy with excision of right vocal cord mass  ANESTHESIA:  General endotracheal tube anesthesia.  COMPLICATIONS:  None.  ESTIMATED BLOOD LOSS:  Minimal.  INDICATION FOR PROCEDURE:  Chad Gomez is a 70 y.o. male with a history of chronic cough. He was treated with multiple courses of antitussives medications and antibiotics. However he continues to be symptomatic. On examination, he was noted to have a right vocal cord nodule. Based on the above findings, the decision was made for the patient to undergo the above-stated procedure. Likelihood of success in reducing symptoms was also discussed.  The risks, benefits, alternatives, and details of the procedure were discussed with the patient.  Questions were invited and answered.  Informed consent was obtained.  DESCRIPTION:  The patient was taken to the operating room and placed supine on the operating table.  General endotracheal tube anesthesia was administered by the anesthesiologist.  The patient was positioned and prepped and draped in a standard fashion for direct laryngoscopy.   A Dedo laryngoscope was used for the examination. The Dedo laryngoscope was inserted via the oral cavity into the pharynx. Examination of the epiglottis, vallecula, aryepiglottic folds, and piriform sinuses were all normal. Examination of the vocal cords show a nearly 1 cm nodule on the right vocal cord. The Dedo laryngoscope was suspended with a Lewy suspender. A microscope was brought into the field. Under the operating microscope, the right vocal cord nodule was excised using a pair of laryngeal scissors. The nodule was sent to the pathology department for permanent histologic identification.  The care of the  patient was turned over to the anesthesiologist.  The patient was awakened from anesthesia without difficulty.  He was extubated and transferred to the recovery room in good condition.  OPERATIVE FINDINGS: A right vocal cord nodule was noted.  SPECIMEN:  Right vocal cord nodule.  FOLLOWUP CARE:  The patient will be discharged home once awake and alert.  The patient will follow up in my office in approximately 1 week.  Siddiq Kaluzny,SUI W 02/18/2016 11:07 AM

## 2016-02-18 NOTE — Transfer of Care (Signed)
Immediate Anesthesia Transfer of Care Note  Patient: Chad Gomez  Procedure(s) Performed: Procedure(s): MICRO DIRECT LARYNGOSCOPY EXCISIONAL BIOPSY OF RIGHT VOCAL CORD MASS (Right)  Patient Location: PACU  Anesthesia Type:General  Level of Consciousness: awake, sedated and patient cooperative  Airway & Oxygen Therapy: Patient Spontanous Breathing and Patient connected to face mask oxygen  Post-op Assessment: Report given to RN and Post -op Vital signs reviewed and stable  Post vital signs: Reviewed and stable  Last Vitals:  Vitals:   02/18/16 0939 02/18/16 0949  BP: (!) 123/104 122/71  Pulse: 79 85  Resp: 20   Temp: 36.6 C     Last Pain:  Vitals:   02/18/16 0939  TempSrc: Oral         Complications: No apparent anesthesia complications

## 2016-02-19 ENCOUNTER — Encounter (HOSPITAL_BASED_OUTPATIENT_CLINIC_OR_DEPARTMENT_OTHER): Payer: Self-pay | Admitting: Otolaryngology

## 2016-02-26 DIAGNOSIS — J381 Polyp of vocal cord and larynx: Secondary | ICD-10-CM | POA: Diagnosis not present

## 2016-03-31 ENCOUNTER — Encounter: Payer: BLUE CROSS/BLUE SHIELD | Admitting: Acute Care

## 2016-03-31 ENCOUNTER — Inpatient Hospital Stay: Admission: RE | Admit: 2016-03-31 | Payer: BLUE CROSS/BLUE SHIELD | Source: Ambulatory Visit

## 2016-05-21 ENCOUNTER — Encounter: Payer: Self-pay | Admitting: Family

## 2016-05-26 ENCOUNTER — Ambulatory Visit (HOSPITAL_COMMUNITY)
Admission: RE | Admit: 2016-05-26 | Discharge: 2016-05-26 | Disposition: A | Discharge status: Home / Self Care | Payer: Medicare Other | Source: Ambulatory Visit | Attending: Surgery | Admitting: Surgery

## 2016-05-26 ENCOUNTER — Ambulatory Visit (INDEPENDENT_AMBULATORY_CARE_PROVIDER_SITE_OTHER): Payer: Medicare Other | Admitting: Family

## 2016-05-26 ENCOUNTER — Encounter: Payer: Self-pay | Admitting: Family

## 2016-05-26 VITALS — BP 121/73 | HR 73 | Temp 97.3°F | Resp 24 | Ht 70.0 in | Wt 181.0 lb

## 2016-05-26 DIAGNOSIS — R0989 Other specified symptoms and signs involving the circulatory and respiratory systems: Secondary | ICD-10-CM | POA: Diagnosis present

## 2016-05-26 DIAGNOSIS — Z95828 Presence of other vascular implants and grafts: Secondary | ICD-10-CM

## 2016-05-26 DIAGNOSIS — I70213 Atherosclerosis of native arteries of extremities with intermittent claudication, bilateral legs: Secondary | ICD-10-CM

## 2016-05-26 DIAGNOSIS — I745 Embolism and thrombosis of iliac artery: Secondary | ICD-10-CM

## 2016-05-26 NOTE — Progress Notes (Signed)
[facility] [place]   CC: Follow up peripheral artery occlusive disease  History of Present Illness Chad Gomez is a 70 y.o. male patient of Dr. Trula Slade who returns for follow-up status post aortobifemoral bypass graft using a 16 x 8 bifurcated dacryon graft on 12/29/2014. This was done for left iliac artery occlusion.  The patient's postoperative course was uncomplicated.   Dr. Trula Slade last evaluated pt on 05-21-15. At that time the patient's claudication symptoms had resolved. Overall he was doing very well. Pt was to follow-up in one year with ABIs.  He continues to deny claudication sx's with walking and he does a great deal of walking on his job at [employer].   He denies any history of stroke, TIA, or MI; denies any know cardiac problems.   Pt Diabetic: No Pt smoker: smoker  (2 ppd x 50 yrs)  Pt meds include: Statin :Yes Betablocker: No ASA: Yes Other anticoagulants/antiplatelets: no   Past Medical History:  Diagnosis Date  . BPH (benign prostatic hypertrophy)   . Chronic kidney disease   . Complication of anesthesia    urinary retention  . Cough   . History of bladder stone   . History of colon polyps    08/2013   pre-canerous  . History of melanoma excision    LEFT EAR  . Hyperlipidemia   . Hypertension   . Iron deficiency   . Peripheral vascular disease ([place])   . Right ureteral stone   . Seasonal allergies   . Shingles 05-15-2015    Social History Social History  Substance Use Topics  . Smoking status: Current Every Day Smoker    Packs/day: 2.00    Years: 50.00    Types: Cigarettes  . Smokeless tobacco: Never Used  . Alcohol use No    Family History Family History  Problem Relation Age of Onset  . Dementia Mother   . Hypertension Mother   . Stroke Mother 76  . Dementia Father   . Hypertension Sister   . Colon cancer Neg Hx     Past Surgical History:  Procedure Laterality Date  . ABDOMINAL AORTAGRAM  11/01/2014    Procedure: Abdominal Aortagram;  Surgeon: Serafina Mitchell, MD;  Location: [facility];  Service: Cardiovascular;;  . AORTA - BILATERAL FEMORAL ARTERY BYPASS GRAFT N/A 12/28/2014   Procedure: AORTOBIFEMORAL BYPASS GRAFT;  Surgeon: Serafina Mitchell, MD;  Location: [place];  Service: Vascular;  Laterality: N/A;  . COLONOSCOPY W/ POLYPECTOMY  09-13-2013  . CYSTOLITHALOPAXY OF BLADDER STONE/ TRANSRECTAL ULTRASOUND PROSTATE BX  08-19-2005  . CYSTOSCOPY W/ RETROGRADES Right 12/02/2012   Procedure: CYSTOSCOPY WITH RETROGRADE PYELOGRAM;  Surgeon: Malka So, MD;  Location: [facility];  Service: Urology;  Laterality: Right;  . CYSTOSCOPY W/ RETROGRADES Bilateral 10/13/2013   Procedure: CYSTOSCOPY WITH RETROGRADE PYELOGRAM;  Surgeon: Irine Seal, MD;  Location: [facility];  Service: Urology;  Laterality: Bilateral;  . CYSTOSCOPY W/ URETERAL STENT PLACEMENT Left 04/11/2014   Procedure: CYSTOSCOPY WITH STENT REPLACEMENT;  Surgeon: Malka So, MD;  [facility];  Service: Urology;  Laterality: Left;  . CYSTOSCOPY WITH LITHOLAPAXY Right 12/02/2012   Procedure: CYSTOSCOPY WITH stone extraction;  Surgeon: Malka So, MD;  Location: [facility];  Service: Urology;  Laterality: Right;  . CYSTOSCOPY WITH LITHOLAPAXY Right 10/13/2013   Procedure: CYSTOSCOPY WITH LITHOLAPAXY, removal of urethral stone with basket;  Surgeon: Irine Seal, MD;  Location: [place]  [place];  Service: Urology;  Laterality: Right;  . CYSTOSCOPY WITH RETROGRADE PYELOGRAM, URETEROSCOPY AND STENT PLACEMENT Left 03/21/2014   Procedure: CYSTOSCOPY WITH RETROGRADE PYELOGRAM, AND LEFT STENT PLACEMENT;  Surgeon: Alexis Frock, MD;  Location: [facility];  Service: Urology;  Laterality: Left;  . CYSTOSCOPY WITH URETEROSCOPY Left 04/11/2014   Procedure: CYSTOSCOPY WITH URETEROSCOPY/ STONE EXTRACTION;  Surgeon: Malka So, MD;  Location: [facility];  Service:  Urology;  Laterality: Left;  . HOLMIUM LASER APPLICATION Right 1/61/0960   Procedure: HOLMIUM LASER APPLICATION;  Surgeon: Malka So, MD;  Location: [facility];  Service: Urology;  Laterality: Right;  . HOLMIUM LASER APPLICATION Left 45/40/9811   Procedure: HOLMIUM LASER APPLICATION;  Surgeon: Malka So, MD;  Location: [facility];  Service: Urology;  Laterality: Left;  . INGUINAL HERNIA REPAIR Right 1954  . LITHOTRIPSY  2007 & 02/2013  . [place]  . MICROLARYNGOSCOPY Right 02/18/2016   Procedure: MICRO DIRECT LARYNGOSCOPY EXCISIONAL BIOPSY OF RIGHT VOCAL CORD MASS;  Surgeon: Leta Baptist, MD;  Location: [place];  Service: ENT;  Laterality: Right;  . [place]   left ear  . MULTIPLE TOOTH EXTRACTIONS  1994  . NEPHROLITHOTOMY Left 01/27/2013   Procedure: NEPHROLITHOTOMY PERCUTANEOUS FIRST LOOK;  Surgeon: Malka So, MD;  Location: [facility];  Service: Urology;  Laterality: Left;  . PERIPHERAL VASCULAR CATHETERIZATION N/A 11/01/2014   Procedure: Lower Extremity Angiography;  Surgeon: Serafina Mitchell, MD;  Location: [facility];  Service: Cardiovascular;  Laterality: N/A;  . URETEROSCOPY Right 12/02/2012   Procedure: RIGHT URETEROSCOPY STONE EXTRACTION WITH  STENT PLACEMENT;  Surgeon: Malka So, MD;  Location: [facility];  Service: Urology;  Laterality: Right;    Allergies  Allergen Reactions  . Morphine And Related Anxiety and Other (See Comments)    Combative, incoherent    . Tape Other (See Comments) and Rash    Surgical tape Adhesive tape causes blisters  . Amoxicillin Other (See Comments)    Severe stomach pain  . Ibuprofen Nausea Only  . Naproxen Swelling    Eyes swell    Current Outpatient Prescriptions  Medication Sig Dispense Refill  . amLODipine (NORVASC) 10 MG tablet Take 10 mg by mouth daily.    Marland Kitchen aspirin EC 81 MG tablet Take 81 mg by mouth daily.    . Cholecalciferol  (VITAMIN D-3) 1000 units CAPS Take by mouth.    . ezetimibe (ZETIA) 10 MG tablet Take 5 mg by mouth daily.    . indapamide (LOZOL) 1.25 MG tablet Take 1.25 mg by mouth daily.    . potassium citrate (UROCIT-K) 10 MEQ (1080 MG) SR tablet Take 10 mEq by mouth 3 (three) times daily with meals.    . pravastatin (PRAVACHOL) 20 MG tablet Take 20 mg by mouth daily.    Marland Kitchen omeprazole (PRILOSEC) 20 MG capsule Take 1 capsule (20 mg total) by mouth daily. (Patient not taking: Reported on 05/26/2016) 30 capsule 5   No current facility-administered medications for this visit.     ROS: See HPI for pertinent positives and negatives.   Physical Examination  Vitals:   05/26/16 1211  BP: 121/73  Pulse: 73  Resp: (!) 24  Temp: 97.3 F (36.3 C)  TempSrc: Oral  SpO2: 95%  Weight: 181 lb (82.1 kg)  Height: '5\' 10"'$  (1.778 m)   Body mass index is 25.97 kg/m.  General:  A&O x 3, WDWN. Gait: normal Eyes: PERRLA. Pulmonary: Respirations with some use of accessory muscles, CTAB, limited air movement in all fields.  Cardiac: regular rhythm, no detected murmur.         Carotid Bruits Right Left   Negative Negative  Aorta is not palpable. Radial pulses: 2+ palpable and =.                           VASCULAR EXAM: Extremities without ischemic changes, without Gangrene; without open wounds.                                                                                                          LE Pulses Right Left       FEMORAL  1+ palpable  2+ palpable        POPLITEAL  not palpable   not palpable       POSTERIOR TIBIAL  2+ palpable   2+ palpable        DORSALIS PEDIS      ANTERIOR TIBIAL 1+ palpable  1+ palpable    Abdomen: soft, NT, no palpable masses. Skin: no rashes, no ulcers noted. Musculoskeletal: no muscle wasting or atrophy.  Neurologic: A&O X 3; Appropriate Affect ; SENSATION: normal; MOTOR FUNCTION:  moving all extremities equally, motor strength 5/5 throughout. Speech is  fluent/normal. CN 2-12 intact.    Non-Invasive Vascular Imaging: DATE: 05/26/2016 ABI:  RIGHT: 0.98 (1.01, 05-21-15), Waveforms: triphasic PT, biphasic DP; TBI: 0.82   LEFT: 1.06 (1.08), Waveforms: triphasic PT, biphasic DP, TBI: 0.86   ASSESSMENT: Chad Gomez is a 70 y.o. male who is status post aortobifemoral bypass graft using a 16 x 8 bifurcated dacryon graft on 12/29/2014. This was done for left iliac artery occlusion.  He remains claudication free with walking, has no signs of ischemia in his feet/legs.  Fortunately he does not have DM. His primary atherosclerotic risk factor is 50 years of smoking, currently 2 ppd, see Plan.  PLAN:  The patient was counseled re smoking cessation and given several free resources re smoking cessation.  Continue extensive walking.   Based on the patient's vascular studies and examination, pt will return to clinic in 1 year with ABI's. I advised him to notify us should he develop pain or weakness in the muscles of his legs with walking or develops wounds in his feet/legs that have trouble healing.   I discussed in depth with the patient the nature of atherosclerosis, and emphasized the importance of maximal medical management including strict control of blood pressure, blood glucose, and lipid levels, obtaining regular exercise, and cessation of smoking.  The patient is aware that without maximal medical management the underlying atherosclerotic disease process will progress, limiting the benefit of any interventions.  The patient was given information about PAD including signs, symptoms, treatment, what symptoms should prompt the patient to seek immediate medical care, and risk reduction measures to take.  Clemon Chambers, RN, MSN, FNP-C [facility] Phone: [phone]  Clinic MD: Trula Slade  05/26/16 12:20 PM

## 2016-05-26 NOTE — Patient Instructions (Signed)
Peripheral Vascular Disease Peripheral vascular disease (PVD) is a disease of the blood vessels that are not part of your heart and brain. A simple term for PVD is poor circulation. In most cases, PVD narrows the blood vessels that carry blood from your heart to the rest of your body. This can result in a decreased supply of blood to your arms, legs, and internal organs, like your stomach or kidneys. However, it most often affects a person's lower legs and feet. There are two types of PVD.  Organic PVD. This is the more common type. It is caused by damage to the structure of blood vessels.  Functional PVD. This is caused by conditions that make blood vessels contract and tighten (spasm). Without treatment, PVD tends to get worse over time. PVD can also lead to acute ischemic limb. This is when an arm or limb suddenly has trouble getting enough blood. This is a medical emergency. Follow these instructions at home:  Take medicines only as told by your doctor.  Do not use any tobacco products, including cigarettes, chewing tobacco, or electronic cigarettes. If you need help quitting, ask your doctor.  Lose weight if you are overweight, and maintain a healthy weight as told by your doctor.  Eat a diet that is low in fat and cholesterol. If you need help, ask your doctor.  Exercise regularly. Ask your doctor for some good activities for you.  Take good care of your feet.  Wear comfortable shoes that fit well.  Check your feet often for any cuts or sores. Contact a doctor if:  You have cramps in your legs while walking.  You have leg pain when you are at rest.  You have coldness in a leg or foot.  Your skin changes.  You are unable to get or have an erection (erectile dysfunction).  You have cuts or sores on your feet that are not healing. Get help right away if:  Your arm or leg turns cold and blue.  Your arms or legs become red, warm, swollen, painful, or numb.  You have  chest pain or trouble breathing.  You suddenly have weakness in your face, arm, or leg.  You become very confused or you cannot speak.  You suddenly have a very bad headache.  You suddenly cannot see. This information is not intended to replace advice given to you by your health care provider. Make sure you discuss any questions you have with your health care provider. Document Released: 09/03/2009 Document Revised: 11/15/2015 Document Reviewed: 11/17/2013 Elsevier Interactive Patient Education  2017 Elsevier Inc.      Steps to Quit Smoking Smoking tobacco can be bad for your health. It can also affect almost every organ in your body. Smoking puts you and people around you at risk for many serious long-lasting (chronic) diseases. Quitting smoking is hard, but it is one of the best things that you can do for your health. It is never too late to quit. What are the benefits of quitting smoking? When you quit smoking, you lower your risk for getting serious diseases and conditions. They can include:  Lung cancer or lung disease.  Heart disease.  Stroke.  Heart attack.  Not being able to have children (infertility).  Weak bones (osteoporosis) and broken bones (fractures). If you have coughing, wheezing, and shortness of breath, those symptoms may get better when you quit. You may also get sick less often. If you are pregnant, quitting smoking can help to lower your chances   of having a baby of low birth weight. What can I do to help me quit smoking? Talk with your doctor about what can help you quit smoking. Some things you can do (strategies) include:  Quitting smoking totally, instead of slowly cutting back how much you smoke over a period of time.  Going to in-person counseling. You are more likely to quit if you go to many counseling sessions.  Using resources and support systems, such as:  Online chats with a counselor.  Phone quitlines.  Printed self-help  materials.  Support groups or group counseling.  Text messaging programs.  Mobile phone apps or applications.  Taking medicines. Some of these medicines may have nicotine in them. If you are pregnant or breastfeeding, do not take any medicines to quit smoking unless your doctor says it is okay. Talk with your doctor about counseling or other things that can help you. Talk with your doctor about using more than one strategy at the same time, such as taking medicines while you are also going to in-person counseling. This can help make quitting easier. What things can I do to make it easier to quit? Quitting smoking might feel very hard at first, but there is a lot that you can do to make it easier. Take these steps:  Talk to your family and friends. Ask them to support and encourage you.  Call phone quitlines, reach out to support groups, or work with a counselor.  Ask people who smoke to not smoke around you.  Avoid places that make you want (trigger) to smoke, such as:  Bars.  Parties.  Smoke-break areas at work.  Spend time with people who do not smoke.  Lower the stress in your life. Stress can make you want to smoke. Try these things to help your stress:  Getting regular exercise.  Deep-breathing exercises.  Yoga.  Meditating.  Doing a body scan. To do this, close your eyes, focus on one area of your body at a time from head to toe, and notice which parts of your body are tense. Try to relax the muscles in those areas.  Download or buy apps on your mobile phone or tablet that can help you stick to your quit plan. There are many free apps, such as QuitGuide from the CDC (Centers for Disease Control and Prevention). You can find more support from smokefree.gov and other websites. This information is not intended to replace advice given to you by your health care provider. Make sure you discuss any questions you have with your health care provider. Document Released:  04/05/2009 Document Revised: 02/05/2016 Document Reviewed: 10/24/2014 Elsevier Interactive Patient Education  2017 Elsevier Inc.  

## 2016-05-27 NOTE — Addendum Note (Signed)
Addended by: Lianne Cure A on: 05/27/2016 10:23 AM   Modules accepted: Orders

## 2016-07-07 DIAGNOSIS — Z125 Encounter for screening for malignant neoplasm of prostate: Secondary | ICD-10-CM | POA: Diagnosis not present

## 2016-07-07 DIAGNOSIS — Z1321 Encounter for screening for nutritional disorder: Secondary | ICD-10-CM | POA: Diagnosis not present

## 2016-07-07 DIAGNOSIS — Z0000 Encounter for general adult medical examination without abnormal findings: Secondary | ICD-10-CM | POA: Diagnosis not present

## 2016-07-07 DIAGNOSIS — E7800 Pure hypercholesterolemia, unspecified: Secondary | ICD-10-CM | POA: Diagnosis not present

## 2016-07-07 DIAGNOSIS — I10 Essential (primary) hypertension: Secondary | ICD-10-CM | POA: Diagnosis not present

## 2016-07-14 DIAGNOSIS — I10 Essential (primary) hypertension: Secondary | ICD-10-CM | POA: Diagnosis not present

## 2016-07-14 DIAGNOSIS — Z0001 Encounter for general adult medical examination with abnormal findings: Secondary | ICD-10-CM | POA: Diagnosis not present

## 2016-07-14 DIAGNOSIS — L0201 Cutaneous abscess of face: Secondary | ICD-10-CM | POA: Diagnosis not present

## 2016-07-21 ENCOUNTER — Encounter: Payer: Self-pay | Admitting: Internal Medicine

## 2016-07-21 ENCOUNTER — Telehealth: Payer: Self-pay | Admitting: Cardiology

## 2016-07-21 DIAGNOSIS — E875 Hyperkalemia: Secondary | ICD-10-CM | POA: Diagnosis not present

## 2016-07-21 NOTE — Telephone Encounter (Signed)
Received records from [facility] for appointment on 08/04/16 with Dr Percival Spanish.  Records put with Dr Hochrein's schedule for 08/04/16. lp

## 2016-07-30 ENCOUNTER — Encounter: Payer: Self-pay | Admitting: Cardiology

## 2016-07-31 DIAGNOSIS — I10 Essential (primary) hypertension: Secondary | ICD-10-CM | POA: Diagnosis not present

## 2016-08-03 ENCOUNTER — Encounter: Payer: Self-pay | Admitting: Cardiology

## 2016-08-03 NOTE — Progress Notes (Signed)
Cardiology Office Note   Date:  08/04/2016   ID:  Chad Gomez, Chad Gomez 04-15-1946, MRN [id number]  PCP:  Horatio Pel, MD  Cardiologist:   Minus Breeding, MD    Chief Complaint  Patient presents with  . Abnormal ECG      History of Present Illness: Chad Gomez is a 71 y.o. male who presents for evaluation of abnormal EKG. He was recently found to have poor anterior R wave progression which was new.  I last saw him in 2016 prior to surgery for high grade stenosis of the left common iliac artery.  The patient had a perfusion study that was negative for ischemia.   He went ahead and had his leg surgery as claudication is much improved. He's been working at vigorous jobs. 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 More recently he worked at [employer] and was stacking and lifting. With this vigorous exercise he denies any cardiovascular symptoms. The patient denies any new symptoms such as chest discomfort, neck or arm discomfort. There has been no new shortness of breath, PND or orthopnea. There have been no reported palpitations, presyncope or syncope.   Past Medical History:  Diagnosis Date  . BPH (benign prostatic hypertrophy)   . Chronic kidney disease   . Complication of anesthesia    urinary retention  . History of bladder stone   . History of colon polyps    08/2013   pre-canerous  . History of melanoma excision    LEFT EAR  . Hyperlipidemia   . Hypertension   . Iron deficiency   . Peripheral vascular disease ([place])   . Right ureteral stone   . Shingles 05-15-2015    Past Surgical History:  Procedure Laterality Date  . ABDOMINAL AORTAGRAM  11/01/2014   Procedure: Abdominal Aortagram;  Surgeon: Serafina Mitchell, MD;  Location: [place] [facility];  Service: Cardiovascular;;  . AORTA - BILATERAL FEMORAL ARTERY BYPASS GRAFT N/A 12/28/2014   Procedure: AORTOBIFEMORAL BYPASS GRAFT;  Surgeon: Serafina Mitchell, MD;  Location: [place];  Service: Vascular;   Laterality: N/A;  . COLONOSCOPY W/ POLYPECTOMY  09-13-2013  . CYSTOLITHALOPAXY OF BLADDER STONE/ TRANSRECTAL ULTRASOUND PROSTATE BX  08-19-2005  . CYSTOSCOPY W/ RETROGRADES Right 12/02/2012   Procedure: CYSTOSCOPY WITH RETROGRADE PYELOGRAM;  Surgeon: Malka So, MD;  [facility];  Service: Urology;  Laterality: Right;  . CYSTOSCOPY W/ RETROGRADES Bilateral 10/13/2013   Procedure: CYSTOSCOPY WITH RETROGRADE PYELOGRAM;  Surgeon: Irine Seal, MD;  Location: [facility];  Service: Urology;  Laterality: Bilateral;  . CYSTOSCOPY W/ URETERAL STENT PLACEMENT Left 04/11/2014   Procedure: CYSTOSCOPY WITH STENT REPLACEMENT;  Surgeon: Malka So, MD;  Location: [facility];  Service: Urology;  Laterality: Left;  . CYSTOSCOPY WITH LITHOLAPAXY Right 12/02/2012   Procedure: CYSTOSCOPY WITH stone extraction;  Surgeon: Malka So, MD;  Location: [facility];  Service: Urology;  Laterality: Right;  . CYSTOSCOPY WITH LITHOLAPAXY Right 10/13/2013   Procedure: CYSTOSCOPY WITH LITHOLAPAXY, removal of urethral stone with basket;  Surgeon: Irine Seal, MD;  Location: [facility];  Service: Urology;  Laterality: Right;  . CYSTOSCOPY WITH RETROGRADE PYELOGRAM, URETEROSCOPY AND STENT PLACEMENT Left 03/21/2014   Procedure: CYSTOSCOPY WITH RETROGRADE PYELOGRAM, AND LEFT STENT PLACEMENT;  Surgeon: Alexis Frock, MD;  Location: [facility];  Service: Urology;  Laterality: Left;  . CYSTOSCOPY WITH URETEROSCOPY Left 04/11/2014   Procedure: CYSTOSCOPY WITH URETEROSCOPY/ STONE  EXTRACTION;  Surgeon: Malka So, MD;  Location: [facility];  Service: Urology;  Laterality: Left;  . HOLMIUM LASER APPLICATION Right 9/67/8938   Procedure: HOLMIUM LASER APPLICATION;  Surgeon: Malka So, MD;  Location: [facility];  Service: Urology;  Laterality: Right;  . HOLMIUM LASER APPLICATION Left 04/08/5101   Procedure: HOLMIUM LASER  APPLICATION;  Surgeon: Malka So, MD;  Location: [facility];  Service: Urology;  Laterality: Left;  . INGUINAL HERNIA REPAIR Right 1954  . LITHOTRIPSY  2007 & 02/2013  . [place]  . MICROLARYNGOSCOPY Right 02/18/2016   Procedure: MICRO DIRECT LARYNGOSCOPY EXCISIONAL BIOPSY OF RIGHT VOCAL CORD MASS;  Surgeon: Leta Baptist, MD;  Location: [place];  Service: ENT;  Laterality: Right;  . [place]   left ear  . MULTIPLE TOOTH EXTRACTIONS  1994  . NEPHROLITHOTOMY Left 01/27/2013   Procedure: NEPHROLITHOTOMY PERCUTANEOUS FIRST LOOK;  Surgeon: Malka So, MD;  Location: [facility];  Service: Urology;  Laterality: Left;  . PERIPHERAL VASCULAR CATHETERIZATION N/A 11/01/2014   Procedure: Lower Extremity Angiography;  Surgeon: Serafina Mitchell, MD;  Location: [place] [facility];  Service: Cardiovascular;  Laterality: N/A;  . URETEROSCOPY Right 12/02/2012   Procedure: RIGHT URETEROSCOPY STONE EXTRACTION WITH  STENT PLACEMENT;  Surgeon: Malka So, MD;  Location: [facility];  Service: Urology;  Laterality: Right;     Current Outpatient Prescriptions  Medication Sig Dispense Refill  . amLODipine (NORVASC) 10 MG tablet Take 10 mg by mouth daily.    Marland Kitchen aspirin EC 81 MG tablet Take 81 mg by mouth daily.    . Cholecalciferol (VITAMIN D-3) 1000 units CAPS Take by mouth.    . ezetimibe (ZETIA) 10 MG tablet Take 5 mg by mouth daily.    . potassium citrate (UROCIT-K) 10 MEQ (1080 MG) SR tablet Take 10 mEq by mouth daily.     . pravastatin (PRAVACHOL) 20 MG tablet Take 20 mg by mouth daily.     No current facility-administered medications for this visit.     Allergies:   Morphine and related; Tape; Amoxicillin; Ibuprofen; and Naproxen    ROS:  Please see the history of present illness.   Otherwise, review of systems are positive for none.   All other systems are reviewed and negative.    PHYSICAL EXAM: VS:  BP (!) 132/94   Pulse 78    Ht '5\' 9"'$  (1.753 m)   Wt 186 lb (84.4 kg)   BMI 27.47 kg/m  , BMI Body mass index is 27.47 kg/m. GENERAL:  Well appearing HEENT:  Pupils equal round and reactive, fundi not visualized, oral mucosa unremarkable, Dentures NECK:  No jugular venous distention, waveform within normal limits, carotid upstroke brisk and symmetric, no bruits, no thyromegaly LYMPHATICS:  No cervical, inguinal adenopathy LUNGS:  Clear to auscultation bilaterally BACK:  No CVA tenderness CHEST:  Unremarkable HEART:  PMI not displaced or sustained,S1 and S2 within normal limits, no S3, no S4, no clicks, no rubs, no murmurs ABD:  Flat, positive bowel sounds normal in frequency in pitch, no bruits, no rebound, no guarding, no midline pulsatile mass, no hepatomegaly, no splenomegaly EXT:  2 plus pulses Upper and mildly decreased dorsalis pedis and posterior tibials bilaterally, no edema, no cyanosis no clubbing SKIN:  No rashes no nodules NEURO:  Cranial nerves II through XII grossly intact, motor grossly intact throughout PSYCH:  Cognitively intact, oriented  to person place and time    EKG:  EKG is ordered today. The ekg ordered today demonstrates sinus rhythm, rate 79, axis within normal limits, intervals within normal limits, no acute ST-T wave changes   Recent Labs: No results found for requested labs within last 8760 hours.    Lipid Panel No results found for: CHOL, TRIG, HDL, CHOLHDL, VLDL, LDLCALC, LDLDIRECT   No results found for: HGBA1C  Wt Readings from Last 3 Encounters:  08/04/16 186 lb (84.4 kg)  05/26/16 181 lb (82.1 kg)  02/18/16 178 lb (80.7 kg)      Other studies Reviewed: Additional studies/ records that were reviewed today include: Office records and EKG. Review of the above records demonstrates:  Please see elsewhere in the note.     ASSESSMENT AND PLAN:  ABNORMAL EKG:  We repeated EKG. It was normal and I suspect that the poor anterior R wave progression was related to lead  placement. Given his asbestos of symptoms and his normal EKG no further testing is indicated he's had no change in his symptoms since his negative stress test in 2016.    TOBACCO ABUSE:  He has been counseled.  We discussed this again at length  HTN:  His blood pressure is well-controlled he'll continue the meds as listed  DM:  I could not find his A1c and will defer to Horatio Pel, MD  Current medicines are reviewed at length with the patient today.  The patient does not have concerns regarding medicines.  The following changes have been made:  no change  Labs/ tests ordered today include: None  Orders Placed This Encounter  Procedures  . EKG 12-Lead     Disposition:   FU with me as needed.     Signed, Minus Breeding, MD  08/04/2016 8:41 AM    [facility]

## 2016-08-04 ENCOUNTER — Ambulatory Visit (INDEPENDENT_AMBULATORY_CARE_PROVIDER_SITE_OTHER): Payer: Medicare Other | Admitting: Cardiology

## 2016-08-04 ENCOUNTER — Encounter: Payer: Self-pay | Admitting: Cardiology

## 2016-08-04 VITALS — BP 132/94 | HR 78 | Ht 69.0 in | Wt 186.0 lb

## 2016-08-04 DIAGNOSIS — Z720 Tobacco use: Secondary | ICD-10-CM | POA: Diagnosis not present

## 2016-08-04 DIAGNOSIS — R9431 Abnormal electrocardiogram [ECG] [EKG]: Secondary | ICD-10-CM

## 2016-08-04 DIAGNOSIS — I10 Essential (primary) hypertension: Secondary | ICD-10-CM | POA: Diagnosis not present

## 2016-08-04 NOTE — Patient Instructions (Signed)
Medication Instructions:  Continue current medcations  Labwork: None Ordered  Testing/Procedures: None Ordered  Follow-Up: Your physician recommends that you schedule a follow-up appointment in: As Needed   Any Other Special Instructions Will Be Listed Below (If Applicable).   If you need a refill on your cardiac medications before your next appointment, please call your pharmacy.

## 2016-08-12 ENCOUNTER — Other Ambulatory Visit: Payer: Self-pay | Admitting: Acute Care

## 2016-08-12 DIAGNOSIS — L03211 Cellulitis of face: Secondary | ICD-10-CM | POA: Diagnosis not present

## 2016-08-12 DIAGNOSIS — F17210 Nicotine dependence, cigarettes, uncomplicated: Secondary | ICD-10-CM

## 2016-08-13 ENCOUNTER — Other Ambulatory Visit (INDEPENDENT_AMBULATORY_CARE_PROVIDER_SITE_OTHER): Payer: Self-pay | Admitting: Otolaryngology

## 2016-08-13 DIAGNOSIS — S02609A Fracture of mandible, unspecified, initial encounter for closed fracture: Secondary | ICD-10-CM

## 2016-08-15 ENCOUNTER — Ambulatory Visit
Admission: RE | Admit: 2016-08-15 | Discharge: 2016-08-15 | Disposition: A | Discharge status: Home / Self Care | Payer: Medicare Other | Source: Ambulatory Visit | Attending: Otolaryngology | Admitting: Otolaryngology

## 2016-08-15 DIAGNOSIS — S02609A Fracture of mandible, unspecified, initial encounter for closed fracture: Secondary | ICD-10-CM

## 2016-08-15 DIAGNOSIS — S0266XA Fracture of symphysis of mandible, initial encounter for closed fracture: Secondary | ICD-10-CM | POA: Diagnosis not present

## 2016-08-22 ENCOUNTER — Ambulatory Visit (INDEPENDENT_AMBULATORY_CARE_PROVIDER_SITE_OTHER)
Admission: RE | Admit: 2016-08-22 | Discharge: 2016-08-22 | Disposition: A | Discharge status: Home / Self Care | Payer: Medicare Other | Source: Ambulatory Visit | Attending: Acute Care | Admitting: Acute Care

## 2016-08-22 ENCOUNTER — Ambulatory Visit (INDEPENDENT_AMBULATORY_CARE_PROVIDER_SITE_OTHER): Payer: Medicare Other | Admitting: Acute Care

## 2016-08-22 ENCOUNTER — Encounter: Payer: Self-pay | Admitting: Acute Care

## 2016-08-22 DIAGNOSIS — F17210 Nicotine dependence, cigarettes, uncomplicated: Secondary | ICD-10-CM | POA: Diagnosis not present

## 2016-08-22 DIAGNOSIS — Z87891 Personal history of nicotine dependence: Secondary | ICD-10-CM

## 2016-08-22 NOTE — Progress Notes (Signed)
Shared Decision Making Visit Lung Cancer Screening Program [phone])   Eligibility:  Age 71 y.o.  Pack Years Smoking History Calculation 82.5 pack years (# packs/per year x # years smoked)  Recent History of coughing up blood  no  Unexplained weight loss? no ( >Than 15 pounds within the last 6 months )  Prior History Lung / other cancer no (Diagnosis within the last 5 years already requiring surveillance chest CT Scans).  Smoking Status Current Smoker  Former Smokers: Years since quit: NA  Quit Date: NA  Visit Components:  Discussion included one or more decision making aids. yes  Discussion included risk/benefits of screening. yes  Discussion included potential follow up diagnostic testing for abnormal scans. yes  Discussion included meaning and risk of over diagnosis. yes  Discussion included meaning and risk of False Positives. yes  Discussion included meaning of total radiation exposure. yes  Counseling Included:  Importance of adherence to annual lung cancer LDCT screening. yes  Impact of comorbidities on ability to participate in the program. yes  Ability and willingness to under diagnostic treatment. yes  Smoking Cessation Counseling:  Current Smokers:   Discussed importance of smoking cessation. yes  Information about tobacco cessation classes and interventions provided to patient. yes  Patient provided with "ticket" for LDCT Scan. yes  Symptomatic Patient. no  Counseling  Diagnosis Code: Tobacco Use Z72.0  Asymptomatic Patient yes  Counseling (Intermediate counseling: > three minutes counseling) I2979  Former Smokers:   Discussed the importance of maintaining cigarette abstinence. yes  Diagnosis Code: Personal History of Nicotine Dependence. G92.119  Information about tobacco cessation classes and interventions provided to patient. Yes  Patient provided with "ticket" for LDCT Scan. yes  Written Order for Lung Cancer Screening with LDCT  placed in Epic. Yes (CT Chest Lung Cancer Screening Low Dose W/O CM) [id number] Z12.2-Screening of respiratory organs Z87.891-Personal history of nicotine dependence  I spent 4 minutes of this visit counseling pt. on smoking cessation.  I have spent 25 minutes of face to face time with Mr. Ramdass discussing the risks and benefits of lung cancer screening. We viewed a power point together that explained in detail the above noted topics. We paused at intervals to allow for questions to be asked and answered to ensure understanding.We discussed that the single most powerful action that he can take to decrease his risk of developing lung cancer is to quit smoking. We discussed whether or not he is ready to commit to setting a quit date. He is not ready to set a quit date. We discussed options for tools to aid in quitting smoking including nicotine replacement therapy, non-nicotine medications, support groups, Quit Smart classes, and behavior modification. We discussed that often times setting smaller, more achievable goals, such as eliminating 1 cigarette a day for a week and then 2 cigarettes a day for a week can be helpful in slowly decreasing the number of cigarettes smoked. This allows for a sense of accomplishment as well as providing a clinical benefit. I gave him the " Be Stronger Than Your Excuses" card with contact information for community resources, classes, free nicotine replacement therapy, and access to mobile apps, text messaging, and on-line smoking cessation help. I have also given him my card and contact information in the event he needs to contact me. We discussed the time and location of the scan, and that either June Leap, CMA, or I will call with the results within 24-48 hours of receiving them. I have provided him  with a copy of the power point we viewed  as a resource in the event they need reinforcement of the concepts we discussed today in the office. The patient verbalized  understanding of all of  the above and had no further questions upon leaving the office. They have my contact information in the event they have any further questions.  We discussed that there is a high incidence of CAD noted on this exam. I explained that in this non-gated exam degree or severity could not be determined. I explained that we will fax the results to his PCP, and for him to follow up with PCP if CAD is noted on his scan. He verbalized understanding.  Magdalen Spatz, NP 08/22/2016

## 2016-08-25 DIAGNOSIS — N132 Hydronephrosis with renal and ureteral calculous obstruction: Secondary | ICD-10-CM | POA: Diagnosis not present

## 2016-08-25 DIAGNOSIS — R3121 Asymptomatic microscopic hematuria: Secondary | ICD-10-CM | POA: Diagnosis not present

## 2016-08-25 DIAGNOSIS — N329 Bladder disorder, unspecified: Secondary | ICD-10-CM | POA: Diagnosis not present

## 2016-08-25 DIAGNOSIS — R972 Elevated prostate specific antigen [PSA]: Secondary | ICD-10-CM | POA: Diagnosis not present

## 2016-08-25 DIAGNOSIS — N201 Calculus of ureter: Secondary | ICD-10-CM | POA: Diagnosis not present

## 2016-08-25 DIAGNOSIS — K8020 Calculus of gallbladder without cholecystitis without obstruction: Secondary | ICD-10-CM | POA: Diagnosis not present

## 2016-08-26 ENCOUNTER — Other Ambulatory Visit: Payer: Self-pay | Admitting: Acute Care

## 2016-08-26 DIAGNOSIS — L03211 Cellulitis of face: Secondary | ICD-10-CM | POA: Diagnosis not present

## 2016-08-26 DIAGNOSIS — F17210 Nicotine dependence, cigarettes, uncomplicated: Secondary | ICD-10-CM

## 2016-08-28 ENCOUNTER — Other Ambulatory Visit: Payer: Self-pay | Admitting: Urology

## 2016-08-29 ENCOUNTER — Other Ambulatory Visit: Payer: Self-pay | Admitting: Urology

## 2016-09-03 ENCOUNTER — Encounter (HOSPITAL_BASED_OUTPATIENT_CLINIC_OR_DEPARTMENT_OTHER): Payer: Self-pay | Admitting: *Deleted

## 2016-09-03 NOTE — Progress Notes (Signed)
Pt instructed npo pmn 3/19 x norvasc, zetia, K, pravastatin w sip of water.  Pt informed no smoking p mn as well.  To [facility] 3/20 @ 0700.  Needs istat on arrival.  ekg in epic.  Pt tates he has open wound l lower jaw.  PCP and ENT aware.  Left message for Pam at [facility].

## 2016-09-08 NOTE — H&P (Signed)
CC: I have kidney stones.  HPI: Chad Gomez is a 71 year-old male established patient who is here for renal calculi.    Chad Gomez returns today in f/u for his history of stones. He had a cystolithalopaxy and right ureteroscopy with holmium and stone extraction in the fall of 09-12-2013. He is doing well with no significant symptoms. He no longer passes stone material. He has no flank pain or hematuria. His UA has 20-40 RBC's which is an increased from the last 3 UA's. He was taken off of the indapamide because of hypokalemia and hypercalcemia. He is on urocit K for his metabolic abnormalities including hypocitraturia and hypercalcemia. He had a PSA in January that was 5.2 which is down from prior levels. He has no associated signs or symptoms.      ALLERGIES: Amoxicillin TABS Morphine Sulfate TABS Naproxen TABS Tape    MEDICATIONS: Aspirin 81 mg tablet, chewable  Amlodipine Besylate 10 mg tablet 0.5 Oral Daily  Biotin  Potassium Citrate Er 10 meq (1,080 mg) tablet, extended release 0 Oral 3 times daily  Pravastatin Sodium 80 mg tablet Oral  Vitamin D3  Zetia 10 mg tablet     GU PSH: Cysto Bladder Stone <2.5cm - 09/12/12 Cysto Bladder Stone >2.5cm - 04/18/2014 Cysto Remove Stent FB Com - September 12, 2013 Cysto Uretero Lithotripsy - 04/18/2014, 09-12-12 Cystoscopy Insert Stent - 04/18/2014, 03/22/2014, 09/12/2012 ESWL - 2012-09-12 Percut Stone Removal >2cm - 2012/09/12 Prostate Needle Biopsy - 09-12-2012 Ureteroscopic stone removal - September 12, 2013      PSH Notes: Bypass Graft (Non-Vein) Aortic-bifemoral, Abdominal Aortic Aneurysm Repair For Dilation Or Occlusion, Cystoscopy With Ureteroscopy With Lithotripsy, Cystoscopy With Fragmentation Of Bladder Calculus Over 2.5cm, Cystoscopy With Insertion Of Ureteral Stent Left, Cystoscopy With Insertion Of Ureteral Stent Left, Cystoscopy With Ureteroscopy With Removal Of Calculus, Cystoscopy With Complicated Removal Of Object, Percutaneous Lithotomy For Stone Over 2cm., Cystoscopy With Ureteroscopy  With Lithotripsy, Cystoscopy With Insertion Of Ureteral Stent Right, Biopsy Of The Prostate Needle, Cystoscopy With Fragmentation Of Bladder Calculus, Lithotripsy, Inguinal Hernia Repair, [id number] nodule removed from larynx 09-13-15.   NON-GU PSH: Abdominal aortic aneurysm repair (open) - 03/21/2015 Aorto-bifemoral bypass graft - 03/21/2015    GU PMH: Elevated PSA, Elevated prostate specific antigen (PSA) - 03/21/2015 Kidney Stone, Bilateral kidney stones - 03/21/2015 Personal Hx Urinary Tract Infections, History of urinary tract infection - 03/21/2015 Retrograde ejaculation, Retrograde ejaculation - 03/21/2015 Urinary Tract Inf, Unspec site, Pyuria - 03/21/2015 Oth urine abnormal findings, Hypocitraturia - 09/06/2014 Urinary Retention, Unspec, Urinary retention - 09/06/2014 Bladder Stone, Bladder calculus - 04/20/2014 Calculus Ureter, Calculus of ureter - 04/20/2014 ED, arterial insufficiency, Erectile dysfunction due to arterial insufficiency - 2013-09-12 Other microscopic hematuria, Microscopic hematuria - 2012-09-12 Nodular prostate w/o LUTS, Nodular prostate without lower urinary tract symptoms - 09-12-12 Personal Hx urinary calculi, Nephrolithiasis - 09-12-12      PMH Notes:  2012-11-17 10:18:21 - Note: Alopecia Areata  2012-11-17 10:18:21 - Note: Arthritis   NON-GU PMH: Aneurysm of artery of lower extremity, 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 Encounter for general adult medical examination without abnormal findings, Encounter for preventive health examination - 09/06/2014 Hypercalcemia, Hypercalcemia - September 12, 2013 Vitamin D deficiency, unspecified, Vitamin D deficiency - 12-Sep-2013 Personal history of other diseases of the circulatory system, History of hypertension - September 12, 2012    FAMILY HISTORY: Death In The Family Father - Runs In Family Death In The Family Mother - Runs In Family Family Health Status Number - Runs In Family Hypertension - Sister   SOCIAL HISTORY: Marital  Status: Married Current Smoking Status:  Patient smokes. Smokes 1 pack per day.   Tobacco Use Assessment Completed: Used Tobacco in last 30 days? Patient's occupation is/was retired.     Notes: Former cigarette smoker, Current every day smoker, Tobacco use, Marital History - Currently Married, Caffeine Use, Alcohol Use, Occupation:   REVIEW OF SYSTEMS:    GU Review Male:   Patient reports get up at night to urinate. Patient denies frequent urination, hard to postpone urination, burning/ pain with urination, leakage of urine, stream starts and stops, trouble starting your stream, have to strain to urinate , erection problems, and penile pain.  Gastrointestinal (Upper):   Patient denies indigestion/ heartburn, nausea, and vomiting.  Gastrointestinal (Lower):   Patient denies diarrhea and constipation.  Constitutional:   Patient denies fever, night sweats, weight loss, and fatigue.  Skin:   Patient denies skin rash/ lesion and itching.  Eyes:   Patient denies blurred vision and double vision.  Ears/ Nose/ Throat:   Patient denies sore throat and sinus problems.  Hematologic/Lymphatic:   Patient denies swollen glands and easy bruising.  Cardiovascular:   Patient denies leg swelling and chest pains.  Respiratory:   Patient denies cough and shortness of breath.  Endocrine:   Patient denies excessive thirst.  Musculoskeletal:   Patient denies back pain and joint pain.  Neurological:   Patient denies headaches and dizziness.  Psychologic:   Patient denies depression and anxiety.   VITAL SIGNS:      08/25/2016 12:38 PM  Weight 186 lb / 84.37 kg  Height 69 in / 175.26 cm  BP 135/83 mmHg  Pulse 78 /min  Temperature 97.3 F / 36 C  BMI 27.5 kg/m   MULTI-SYSTEM PHYSICAL EXAMINATION:    Constitutional: Well-nourished. No physical deformities. Normally developed. Good grooming.  Respiratory: No labored breathing, no use of accessory muscles. distant BS  Cardiovascular: Normal temperature, RRR without murmur. Marland Kitchen      PAST DATA  REVIEWED:  Source Of History:  Patient  Lab Test Review:   PSA  Urine Test Review:   Urinalysis  X-Ray Review: C.T. Stone Protocol: Reviewed Films. Discussed With Patient. He has 2 right mid ureteral stones with obstruction.     11/17/12 07/21/05  PSA  Total PSA 6.75  6.73   Free PSA 1.80  1.42   % Free PSA 27  21.1     PROCEDURES:         C.T. Urogram - [id number]               Urinalysis w/Scope - 81001 Dipstick Dipstick Cont'd Micro  Color: Yellow Bilirubin: Neg WBC/hpf: 0 - 5/hpf  Appearance: Clear Ketones: Neg RBC/hpf: 20 - 40/hpf  Specific Gravity: 1.025 Blood: 3+ Bacteria: Rare (0-9/hpf)  pH: 6.5 Protein: Trace Cystals: NS (Not Seen)  Glucose: Neg Urobilinogen: 0.2 Casts: NS (Not Seen)    Nitrites: Neg Trichomonas: Not Present    Leukocyte Esterase: Neg Mucous: Not Present      Epithelial Cells: NS (Not Seen)      Yeast: NS (Not Seen)      Sperm: Not Present    ASSESSMENT:      ICD-10 Details  1 GU:   Asymptomatic microscopic hematuria - R31.21 Worsening - He has increased microhematuria today and a CT was obtained.   2   Calculus Ureter - N20.1 Right, He has 2 right mid ureteral stones with marked hydro. The largest is about 46m. I am going to get him  set up for ureteroscopy with laser and stent and reviewed the risks in detail. I think he should go back to urocit K tid if that is ok with Dr. Shelia Media.   3   Elevated PSA - R97.20 Improving - His PSA is falling.    PLAN:            Medications Stop Meds: Indapamide 2.5 mg tablet 0 Oral  Start: 04/20/2014  Discontinue: 08/25/2016  - Reason: The medication cycle was completed. PCP stopped med per pt           Orders X-Rays: C.T. Stone Protocol Without Contrast          Schedule Return Visit/Planned Activity: Next Available Appointment - Schedule Surgery             Note: within 2-3 weeks.  Return Visit/Planned Activity: Next Available Appointment - Office Visit

## 2016-09-09 ENCOUNTER — Ambulatory Visit (HOSPITAL_BASED_OUTPATIENT_CLINIC_OR_DEPARTMENT_OTHER)
Admission: RE | Admit: 2016-09-09 | Discharge: 2016-09-09 | Disposition: A | Discharge status: Home / Self Care | Payer: Medicare Other | Source: Ambulatory Visit | Attending: Urology | Admitting: Urology

## 2016-09-09 ENCOUNTER — Encounter (HOSPITAL_BASED_OUTPATIENT_CLINIC_OR_DEPARTMENT_OTHER)
Admission: RE | Disposition: A | Discharge status: Home / Self Care | Payer: Self-pay | Source: Ambulatory Visit | Attending: Urology

## 2016-09-09 ENCOUNTER — Encounter (HOSPITAL_BASED_OUTPATIENT_CLINIC_OR_DEPARTMENT_OTHER): Payer: Self-pay

## 2016-09-09 ENCOUNTER — Ambulatory Visit (HOSPITAL_BASED_OUTPATIENT_CLINIC_OR_DEPARTMENT_OTHER): Payer: Medicare Other | Admitting: Anesthesiology

## 2016-09-09 DIAGNOSIS — Z880 Allergy status to penicillin: Secondary | ICD-10-CM | POA: Diagnosis not present

## 2016-09-09 DIAGNOSIS — N132 Hydronephrosis with renal and ureteral calculous obstruction: Secondary | ICD-10-CM | POA: Diagnosis not present

## 2016-09-09 DIAGNOSIS — N202 Calculus of kidney with calculus of ureter: Secondary | ICD-10-CM | POA: Diagnosis not present

## 2016-09-09 DIAGNOSIS — M1990 Unspecified osteoarthritis, unspecified site: Secondary | ICD-10-CM | POA: Diagnosis not present

## 2016-09-09 DIAGNOSIS — Z7982 Long term (current) use of aspirin: Secondary | ICD-10-CM | POA: Insufficient documentation

## 2016-09-09 DIAGNOSIS — I10 Essential (primary) hypertension: Secondary | ICD-10-CM | POA: Insufficient documentation

## 2016-09-09 DIAGNOSIS — F17210 Nicotine dependence, cigarettes, uncomplicated: Secondary | ICD-10-CM | POA: Diagnosis not present

## 2016-09-09 DIAGNOSIS — Z885 Allergy status to narcotic agent status: Secondary | ICD-10-CM | POA: Diagnosis not present

## 2016-09-09 DIAGNOSIS — N201 Calculus of ureter: Secondary | ICD-10-CM | POA: Diagnosis not present

## 2016-09-09 DIAGNOSIS — I739 Peripheral vascular disease, unspecified: Secondary | ICD-10-CM | POA: Diagnosis not present

## 2016-09-09 DIAGNOSIS — Z466 Encounter for fitting and adjustment of urinary device: Secondary | ICD-10-CM | POA: Diagnosis not present

## 2016-09-09 DIAGNOSIS — N210 Calculus in bladder: Secondary | ICD-10-CM | POA: Diagnosis not present

## 2016-09-09 HISTORY — DX: Presence of dental prosthetic device (complete) (partial): Z97.2

## 2016-09-09 HISTORY — DX: Cough: R05

## 2016-09-09 HISTORY — PX: HOLMIUM LASER APPLICATION: SHX5852

## 2016-09-09 HISTORY — DX: Other injury of unspecified body region, initial encounter: T14.8XXA

## 2016-09-09 HISTORY — PX: CYSTOSCOPY WITH RETROGRADE PYELOGRAM, URETEROSCOPY AND STENT PLACEMENT: SHX5789

## 2016-09-09 LAB — POCT I-STAT, CHEM 8
BUN: 14 mg/dL (ref 6–20)
CREATININE: 1 mg/dL (ref 0.61–1.24)
Calcium, Ion: 1.32 mmol/L (ref 1.15–1.40)
Chloride: 104 mmol/L (ref 101–111)
Glucose, Bld: 108 mg/dL — ABNORMAL HIGH (ref 65–99)
HEMATOCRIT: 45 % (ref 39.0–52.0)
HEMOGLOBIN: 15.3 g/dL (ref 13.0–17.0)
POTASSIUM: 3.4 mmol/L — AB (ref 3.5–5.1)
SODIUM: 142 mmol/L (ref 135–145)
TCO2: 27 mmol/L (ref 0–100)

## 2016-09-09 SURGERY — CYSTOURETEROSCOPY, WITH RETROGRADE PYELOGRAM AND STENT INSERTION
Anesthesia: General | Laterality: Right

## 2016-09-09 MED ORDER — SODIUM CHLORIDE 0.9% FLUSH
3.0000 mL | INTRAVENOUS | Status: DC | PRN
  Filled 2016-09-09: qty 3

## 2016-09-09 MED ORDER — SODIUM CHLORIDE 0.9% FLUSH
3.0000 mL | Freq: Two times a day (BID) | INTRAVENOUS | Status: DC
  Filled 2016-09-09: qty 3

## 2016-09-09 MED ORDER — ONDANSETRON HCL 4 MG/2ML IJ SOLN
INTRAMUSCULAR | Status: AC
  Filled 2016-09-09: qty 2

## 2016-09-09 MED ORDER — PHENAZOPYRIDINE HCL 100 MG PO TABS
ORAL_TABLET | ORAL | Status: AC
  Filled 2016-09-09: qty 2

## 2016-09-09 MED ORDER — OXYCODONE HCL 5 MG PO TABS: 5.0000 mg | ORAL_TABLET | Freq: Four times a day (QID) | ORAL | 0 refills | Status: DC | PRN

## 2016-09-09 MED ORDER — PHENAZOPYRIDINE HCL 200 MG PO TABS: 200.0000 mg | ORAL_TABLET | Freq: Three times a day (TID) | ORAL | 1 refills | Status: DC | PRN

## 2016-09-09 MED ORDER — EPHEDRINE SULFATE-NACL 50-0.9 MG/10ML-% IV SOSY
PREFILLED_SYRINGE | INTRAVENOUS | Status: DC | PRN
  Administered 2016-09-09: 15 mg via INTRAVENOUS
  Administered 2016-09-09: 10 mg via INTRAVENOUS

## 2016-09-09 MED ORDER — MIDAZOLAM HCL 5 MG/5ML IJ SOLN
INTRAMUSCULAR | Status: DC | PRN
  Administered 2016-09-09: 2 mg via INTRAVENOUS

## 2016-09-09 MED ORDER — SODIUM CHLORIDE 0.9 % IV SOLN
250.0000 mL | INTRAVENOUS | Status: DC | PRN
  Filled 2016-09-09: qty 250

## 2016-09-09 MED ORDER — PHENAZOPYRIDINE HCL 200 MG PO TABS
200.0000 mg | ORAL_TABLET | Freq: Once | ORAL | Status: AC
  Administered 2016-09-09: 200 mg via ORAL
  Filled 2016-09-09: qty 1

## 2016-09-09 MED ORDER — IOHEXOL 300 MG/ML  SOLN
INTRAMUSCULAR | Status: DC | PRN
  Administered 2016-09-09: 3 mL via URETHRAL

## 2016-09-09 MED ORDER — CIPROFLOXACIN IN D5W 400 MG/200ML IV SOLN
400.0000 mg | Freq: Two times a day (BID) | INTRAVENOUS | Status: DC
  Administered 2016-09-09: 400 mg via INTRAVENOUS
  Filled 2016-09-09: qty 200

## 2016-09-09 MED ORDER — LIDOCAINE 2% (20 MG/ML) 5 ML SYRINGE
INTRAMUSCULAR | Status: DC | PRN
  Administered 2016-09-09: 80 mg via INTRAVENOUS

## 2016-09-09 MED ORDER — LACTATED RINGERS IV SOLN
INTRAVENOUS | Status: DC
  Administered 2016-09-09 (×2): via INTRAVENOUS
  Filled 2016-09-09: qty 1000

## 2016-09-09 MED ORDER — FENTANYL CITRATE (PF) 100 MCG/2ML IJ SOLN
INTRAMUSCULAR | Status: DC | PRN
  Administered 2016-09-09: 25 ug via INTRAVENOUS
  Administered 2016-09-09: 50 ug via INTRAVENOUS
  Administered 2016-09-09: 25 ug via INTRAVENOUS

## 2016-09-09 MED ORDER — FENTANYL CITRATE (PF) 100 MCG/2ML IJ SOLN
INTRAMUSCULAR | Status: AC
  Filled 2016-09-09: qty 2

## 2016-09-09 MED ORDER — DEXAMETHASONE SODIUM PHOSPHATE 4 MG/ML IJ SOLN
INTRAMUSCULAR | Status: DC | PRN
  Administered 2016-09-09: 10 mg via INTRAVENOUS

## 2016-09-09 MED ORDER — PROPOFOL 10 MG/ML IV BOLUS
INTRAVENOUS | Status: AC
  Filled 2016-09-09: qty 20

## 2016-09-09 MED ORDER — CIPROFLOXACIN IN D5W 400 MG/200ML IV SOLN
INTRAVENOUS | Status: AC
  Filled 2016-09-09: qty 200

## 2016-09-09 MED ORDER — LIDOCAINE HCL 2 % EX GEL
CUTANEOUS | Status: AC
  Filled 2016-09-09: qty 5

## 2016-09-09 MED ORDER — ACETAMINOPHEN 325 MG PO TABS
650.0000 mg | ORAL_TABLET | ORAL | Status: DC | PRN
  Filled 2016-09-09: qty 2

## 2016-09-09 MED ORDER — OXYCODONE HCL 5 MG PO TABS
5.0000 mg | ORAL_TABLET | ORAL | Status: DC | PRN
  Filled 2016-09-09: qty 2

## 2016-09-09 MED ORDER — PROMETHAZINE HCL 25 MG/ML IJ SOLN
6.2500 mg | INTRAMUSCULAR | Status: DC | PRN
Start: 2016-09-09 — End: 2016-09-09
  Filled 2016-09-09: qty 1

## 2016-09-09 MED ORDER — ONDANSETRON HCL 4 MG/2ML IJ SOLN
INTRAMUSCULAR | Status: DC | PRN
  Administered 2016-09-09: 4 mg via INTRAVENOUS

## 2016-09-09 MED ORDER — FENTANYL CITRATE (PF) 100 MCG/2ML IJ SOLN
25.0000 ug | INTRAMUSCULAR | Status: DC | PRN
  Filled 2016-09-09: qty 1

## 2016-09-09 MED ORDER — DEXAMETHASONE SODIUM PHOSPHATE 10 MG/ML IJ SOLN
INTRAMUSCULAR | Status: AC
  Filled 2016-09-09: qty 1

## 2016-09-09 MED ORDER — LIDOCAINE HCL 2 % EX GEL
CUTANEOUS | Status: DC | PRN
  Administered 2016-09-09: 1

## 2016-09-09 MED ORDER — MIDAZOLAM HCL 2 MG/2ML IJ SOLN
INTRAMUSCULAR | Status: AC
  Filled 2016-09-09: qty 2

## 2016-09-09 MED ORDER — PROPOFOL 10 MG/ML IV BOLUS
INTRAVENOUS | Status: DC | PRN
  Administered 2016-09-09: 100 mg via INTRAVENOUS
  Administered 2016-09-09: 200 mg via INTRAVENOUS

## 2016-09-09 MED ORDER — OXYCODONE HCL 5 MG PO TABS
5.0000 mg | ORAL_TABLET | Freq: Once | ORAL | Status: DC | PRN
  Filled 2016-09-09: qty 1

## 2016-09-09 MED ORDER — OXYCODONE HCL 5 MG/5ML PO SOLN
5.0000 mg | Freq: Once | ORAL | Status: DC | PRN
  Filled 2016-09-09: qty 5

## 2016-09-09 MED ORDER — ACETAMINOPHEN 650 MG RE SUPP
650.0000 mg | RECTAL | Status: DC | PRN
  Filled 2016-09-09: qty 1

## 2016-09-09 MED ORDER — SODIUM CHLORIDE 0.9 % IR SOLN
Status: DC | PRN
  Administered 2016-09-09: 4000 mL via INTRAVESICAL

## 2016-09-09 MED ORDER — HYDROMORPHONE HCL 1 MG/ML IJ SOLN
0.2500 mg | INTRAMUSCULAR | Status: DC | PRN
  Filled 2016-09-09: qty 0.5

## 2016-09-09 SURGICAL SUPPLY — 44 items
BAG DRAIN URO-CYSTO SKYTR STRL (DRAIN) ×4 IMPLANT
BAG DRN UROCATH (DRAIN) ×2
BASKET LASER NITINOL 1.9FR (BASKET) IMPLANT
BASKET STONE 1.7 NGAGE (UROLOGICAL SUPPLIES) ×3 IMPLANT
BASKET STONE NCOMPASS (UROLOGICAL SUPPLIES) ×3 IMPLANT
BASKET ZERO TIP NITINOL 2.4FR (BASKET) IMPLANT
BSKT STON RTRVL 120 1.9FR (BASKET)
BSKT STON RTRVL ZERO TP 2.4FR (BASKET)
CATH URET 5FR 28IN CONE TIP (BALLOONS)
CATH URET 5FR 28IN OPEN ENDED (CATHETERS) ×2 IMPLANT
CATH URET 5FR 70CM CONE TIP (BALLOONS) IMPLANT
CLOTH BEACON ORANGE TIMEOUT ST (SAFETY) ×4 IMPLANT
ELECT REM PT RETURN 9FT ADLT (ELECTROSURGICAL)
ELECTRODE REM PT RTRN 9FT ADLT (ELECTROSURGICAL) IMPLANT
FIBER LASER FLEXIVA 1000 (UROLOGICAL SUPPLIES) IMPLANT
FIBER LASER FLEXIVA 365 (UROLOGICAL SUPPLIES) IMPLANT
FIBER LASER FLEXIVA 550 (UROLOGICAL SUPPLIES) IMPLANT
FIBER LASER TRAC TIP (UROLOGICAL SUPPLIES) ×2 IMPLANT
GLOVE BIO SURGEON STRL SZ 6.5 (GLOVE) ×2 IMPLANT
GLOVE BIO SURGEONS STRL SZ 6.5 (GLOVE) ×1
GLOVE BIOGEL PI IND STRL 6.5 (GLOVE) IMPLANT
GLOVE BIOGEL PI INDICATOR 6.5 (GLOVE) ×2
GLOVE SURG SS PI 8.0 STRL IVOR (GLOVE) ×4 IMPLANT
GOWN STRL REUS W/ TWL LRG LVL3 (GOWN DISPOSABLE) IMPLANT
GOWN STRL REUS W/ TWL XL LVL3 (GOWN DISPOSABLE) ×2 IMPLANT
GOWN STRL REUS W/TWL LRG LVL3 (GOWN DISPOSABLE)
GOWN STRL REUS W/TWL XL LVL3 (GOWN DISPOSABLE) ×4
GUIDEWIRE 0.038 PTFE COATED (WIRE) IMPLANT
GUIDEWIRE ANG ZIPWIRE 038X150 (WIRE) IMPLANT
GUIDEWIRE STR DUAL SENSOR (WIRE) ×7 IMPLANT
IV NS 1000ML (IV SOLUTION) ×4
IV NS 1000ML BAXH (IV SOLUTION) IMPLANT
IV NS IRRIG 3000ML ARTHROMATIC (IV SOLUTION) ×4 IMPLANT
KIT BALLIN UROMAX 15FX10 (LABEL) IMPLANT
KIT BALLN UROMAX 15FX4 (MISCELLANEOUS) IMPLANT
KIT BALLN UROMAX 26 75X4 (MISCELLANEOUS)
KIT RM TURNOVER CYSTO AR (KITS) ×4 IMPLANT
MANIFOLD NEPTUNE II (INSTRUMENTS) ×3 IMPLANT
PACK CYSTO (CUSTOM PROCEDURE TRAY) ×4 IMPLANT
SET HIGH PRES BAL DIL (LABEL)
SHEATH ACCESS URETERAL 38CM (SHEATH) IMPLANT
STENT URET 6FRX26 CONTOUR (STENTS) ×3 IMPLANT
TUBE CONNECTING 12'X1/4 (SUCTIONS) ×1
TUBE CONNECTING 12X1/4 (SUCTIONS) ×1 IMPLANT

## 2016-09-09 NOTE — Anesthesia Postprocedure Evaluation (Signed)
Anesthesia Post Note  Patient: Chad Gomez  Procedure(s) Performed: Procedure(s) (LRB): CYSTOSCOPY WITH RIGHT  RETROGRADE PYELOGRAM, URETEROSCOPY WITH LASER BASKET EXTRACTION OF STONES  AND STENT PLACEMENT HOLMIUM LASER APPLICATION (Right)  Anesthesia Type: General       Last Vitals:  Vitals:   09/09/16 0658  BP: (!) 153/82  Pulse: 84  Resp: 18  Temp: 36.6 C    Last Pain:  Vitals:   09/09/16 0658  TempSrc: Oral                 Arcangel Minion F

## 2016-09-09 NOTE — Anesthesia Preprocedure Evaluation (Signed)
Anesthesia Evaluation  Patient identified by MRN, date of birth, ID band Patient awake    Reviewed: Allergy & Precautions, H&P , NPO status , Patient's Chart, lab work & pertinent test results  Airway Mallampati: II  TM Distance: >3 FB Neck ROM: Full    Dental no notable dental hx.    Pulmonary Current Smoker,    Pulmonary exam normal breath sounds clear to auscultation       Cardiovascular hypertension, Pt. on medications + Peripheral Vascular Disease  Normal cardiovascular exam Rhythm:Regular Rate:Normal     Neuro/Psych negative neurological ROS  negative psych ROS   GI/Hepatic negative GI ROS, Neg liver ROS,   Endo/Other  negative endocrine ROS  Renal/GU negative Renal ROS  negative genitourinary   Musculoskeletal negative musculoskeletal ROS (+)   Abdominal   Peds negative pediatric ROS (+)  Hematology negative hematology ROS (+)   Anesthesia Other Findings   Reproductive/Obstetrics negative OB ROS                             Anesthesia Physical  Anesthesia Plan  ASA: II  Anesthesia Plan: General   Post-op Pain Management:    Induction: Intravenous  Airway Management Planned: LMA  Additional Equipment:   Intra-op Plan:   Post-operative Plan: Extubation in OR  Informed Consent: I have reviewed the patients History and Physical, chart, labs and discussed the procedure including the risks, benefits and alternatives for the proposed anesthesia with the patient or authorized representative who has indicated his/her understanding and acceptance.   Dental advisory given  Plan Discussed with: CRNA and Surgeon  Anesthesia Plan Comments:         Anesthesia Quick Evaluation

## 2016-09-09 NOTE — Anesthesia Postprocedure Evaluation (Signed)
Anesthesia Post Note  Patient: SENA CLOUATRE  Procedure(s) Performed: Procedure(s) (LRB): CYSTOSCOPY WITH RIGHT  RETROGRADE PYELOGRAM, URETEROSCOPY WITH LASER BASKET EXTRACTION OF STONES  AND STENT PLACEMENT HOLMIUM LASER APPLICATION (Right)  Patient location during evaluation: PACU Anesthesia Type: General Level of consciousness: awake and alert Pain management: pain level controlled Vital Signs Assessment: post-procedure vital signs reviewed and stable Respiratory status: spontaneous breathing, nonlabored ventilation and respiratory function stable Cardiovascular status: blood pressure returned to baseline and stable Postop Assessment: no signs of nausea or vomiting Anesthetic complications: no       Last Vitals:  Vitals:   09/09/16 1000 09/09/16 1015  BP: 135/84 136/86  Pulse: 88 88  Resp: 16 19  Temp: 36.2 C     Last Pain:  Vitals:   09/09/16 0950  TempSrc:   PainSc: Asleep                 Lynda Rainwater

## 2016-09-09 NOTE — Progress Notes (Signed)
Orders received to place #16 foley -650 pink clear urine returned-connected to leg bag.will instruct to remove at home on Thursday.

## 2016-09-09 NOTE — Transfer of Care (Signed)
Immediate Anesthesia Transfer of Care Note  Patient: Chad Gomez  Procedure(s) Performed: Procedure(s): CYSTOSCOPY WITH RIGHT  RETROGRADE PYELOGRAM, URETEROSCOPY WITH LASER BASKET EXTRACTION OF STONES  AND STENT PLACEMENT HOLMIUM LASER APPLICATION (Right)  Patient Location: PACU  Anesthesia Type:General  Level of Consciousness: oriented, sedated and patient cooperative  Airway & Oxygen Therapy: Patient Spontanous Breathing and Patient connected to nasal cannula oxygen  Post-op Assessment: Report given to RN and Post -op Vital signs reviewed and stable  Post vital signs: Reviewed and stable  Last Vitals:  Vitals:   09/09/16 0658  BP: (!) 153/82  Pulse: 84  Resp: 18  Temp: 36.6 C    Last Pain:  Vitals:   09/09/16 0658  TempSrc: Oral      Patients Stated Pain Goal: 9 (81/84/03 7543)  Complications: No apparent anesthesia complications

## 2016-09-09 NOTE — Discharge Instructions (Addendum)
Ureteral Stent Implantation, Care After Refer to this sheet in the next few weeks. These instructions provide you with information about caring for yourself after your procedure. Your health care provider may also give you more specific instructions. Your treatment has been planned according to current medical practices, but problems sometimes occur. Call your health care provider if you have any problems or questions after your procedure. What can I expect after the procedure? After the procedure, it is common to have:  Nausea.  Mild pain when you urinate. You may feel this pain in your lower back or lower abdomen. Pain should stop within a few minutes after you urinate. This may last for up to 1 week.  A small amount of blood in your urine for several days. Follow these instructions at home:   Medicines   Take over-the-counter and prescription medicines only as told by your health care provider.  If you were prescribed an antibiotic medicine, take it as told by your health care provider. Do not stop taking the antibiotic even if you start to feel better.  Do not drive for 24 hours if you received a sedative.  Do not drive or operate heavy machinery while taking prescription pain medicines. Activity   Return to your normal activities as told by your health care provider. Ask your health care provider what activities are safe for you.  Do not lift anything that is heavier than 10 lb (4.5 kg). Follow this limit for 1 week after your procedure, or for as long as told by your health care provider. General instructions   Watch for any blood in your urine. Call your health care provider if the amount of blood in your urine increases.  If you have a catheter:  Follow instructions from your health care provider about taking care of your catheter and collection bag.  Do not take baths, swim, or use a hot tub until your health care provider approves.  Drink enough fluid to keep your urine  clear or pale yellow.  Keep all follow-up visits as told by your health care provider. This is important. Contact a health care provider if:  You have pain that gets worse or does not get better with medicine, especially pain when you urinate.  You have difficulty urinating.  You feel nauseous or you vomit repeatedly during a period of more than 2 days after the procedure. Get help right away if:  Your urine is dark red or has blood clots in it.  You are leaking urine (have incontinence).  The end of the stent comes out of your urethra.  You cannot urinate.  You have sudden, sharp, or severe pain in your abdomen or lower back.  You have a fever. This information is not intended to replace advice given to you by your health care provider. Make sure you discuss any questions you have with your health care provider. Document Released: 02/09/2013 Document Revised: 11/15/2015 Document Reviewed: 12/22/2014 Elsevier Interactive Patient Education  2017 [place] foley at home Thursday    Post Anesthesia Home Care Instructions  Activity: Get plenty of rest for the remainder of the day. A responsible individual must stay with you for 24 hours following the procedure.  For the next 24 hours, DO NOT: -Drive a car -Paediatric nurse -Drink alcoholic beverages -Take any medication unless instructed by your physician -Make any legal decisions or sign important papers.  Meals: Start with liquid foods such as gelatin or soup. Progress to regular  foods as tolerated. Avoid greasy, spicy, heavy foods. If nausea and/or vomiting occur, drink only clear liquids until the nausea and/or vomiting subsides. Call your physician if vomiting continues.  Special Instructions/Symptoms: Your throat may feel dry or sore from the anesthesia or the breathing tube placed in your throat during surgery. If this causes discomfort, gargle with warm salt water. The discomfort should disappear within  24 hours.  If you had a scopolamine patch placed behind your ear for the management of post- operative nausea and/or vomiting:  1. The medication in the patch is effective for 72 hours, after which it should be removed.  Wrap patch in a tissue and discard in the trash. Wash hands thoroughly with soap and water. 2. You may remove the patch earlier than 72 hours if you experience unpleasant side effects which may include dry mouth, dizziness or visual disturbances. 3. Avoid touching the patch. Wash your hands with soap and water after contact with the patch.

## 2016-09-09 NOTE — Op Note (Signed)
NAME:  Chad Gomez, Chad Gomez                   ACCOUNT NO.:  MEDICAL RECORD NO.:  [id number]  LOCATION:                                 FACILITY:  PHYSICIAN:  Marshall Cork. Jeffie Pollock, M.D.         DATE OF BIRTH:  DATE OF PROCEDURE:  09/09/2016 DATE OF DISCHARGE:                              OPERATIVE REPORT   PROCEDURE: 1. Cystoscopy with removal of prostate stone. 2. Cystoscopy with right retrograde pyelogram and interpretation. 3. Ureteroscopy with holmium lasertripsy and stone extraction with     placement of right double-J stent.  PREOPERATIVE DIAGNOSIS:  Right mid ureteral stone.  POSTOPERATIVE DIAGNOSIS:  Right mid ureteral stone.  SURGEON:  Marshall Cork. Jeffie Pollock, M.D.  ANESTHESIA:  General.  SPECIMEN:  Stone fragments.  DRAINS:  A 6-French x 26-cm right double-J stent.  BLOOD LOSS:  None.  COMPLICATIONS:  None.  INDICATIONS:  Chad Gomez is a 71 year old white male with a history of stones who was recently evaluated for hematuria and was found to have an 8-mm obstructing right ureteral stone.  He had minimal symptoms otherwise.  It was felt that ureteroscopy was indicated.  FINDINGS OF PROCEDURE:  He was given Cipro.  He was taken to the operating room where general anesthetic was induced.  He was placed in lithotomy position and fitted with PAS hose.  His perineum and genitalia were prepped with Betadine solution, and he was draped in usual sterile fashion.  Cystoscopy was performed using the 23-French scope and 30-degree lens. Examination revealed a normal urethra.  The external sphincter was intact.  The prostatic urethra had trilobar hyperplasia with obstruction, and there was a stone in the prostatic urethra.  Inspection of the bladder demonstrated mild trabeculation.  The ureteral orifices were in the normal anatomic position effluxing clear urine.  There was also a small stone in the bladder.  The bladder stone was evacuated from the bladder, and an NGage basket was then  used to remove the prostatic stone.  The cystoscope was reinserted, and a 5-French open-end catheter was then passed up the right ureteral orifice and contrast was instilled.  This revealed a tight intramural ureter.  There was a filling defect in the mid ureter consistent with a stone with proximal hydronephrosis and tortuosity.  I then advanced a Sensor wire through the open-end catheter and was able to negotiate it into the kidney by advancing the open-end catheter along with the wire as needed.  Once the wire was in good position, the open-end catheter and cystoscope were removed and the 4.5-French semi-rigid ureteroscope was then passed alongside the wire.  I was able to get up to the level of the stone, but was not able to bring the scope to bear on a stone in a fashion that would be adequate for treatment.  At this point, an angled-tip Glidewire was passed through the ureteroscope to the kidney, and the ureteroscope was removed.  A 38-cm digital access sheath was then advanced over the Glidewire alongside the Sensor wire into the mid ureter.  It was tight at the intramural ureter and just below the stone.  The inner core and Glidewire  were then removed and a single-lumen digital flexible scope was then passed through the sheath.  I was able to get by the large mid ureteral stone and saw that there were couple of other stones proximal to the primary target.  A 200 micron TracTip laser fiber was then inserted through the flexible scope, was initially set on 0.3 watts and 20 hertz.  This was carried up to 0.5 watts eventually, and the stone was broken up into manageable fragments and dust.  I then removed the laser fiber and inserted an NGage basket and retrieved the smaller stones that were proximal to the mid ureteral stone.  One of these also required laser application to get through the sheath.  Once all of these stones were removed, the ureteroscope was advanced  to the kidney.  No other stones were identified.  The ureteroscope was then backed out under visual guidance along with the sheath and the Nitinol basket was used to rake some of the fragments down.  I then removed the digital scope and went back to the semi-rigid scope and used an NCompass basket to remove additional residual fragments from the mid and distal ureter.  Once all significant fragments were felt to be removed, a final pass of the digital flexible was made, which revealed only some dust adherent to the mucosa.  At this point, the cystoscope was reinserted over the wire, and a 6- French x 26-cm Contour double-J stent was inserted to the kidney under fluoroscopic guidance.  The wire was removed leaving good coil in the kidney and a good coil in the bladder.  The cystoscope was then used to evacuate the stone fragments that had been relocated to the bladder.  The bladder was then drained.  The cystoscope was removed.  The stone fragments were retrieved.  The patient was taken down from lithotomy position.  His anesthetic was reversed, and he was moved to Recovery in stable condition.  The residual stone fragments were given to the patient's wife to show the patient for his review.     Marshall Cork. Jeffie Pollock, M.D.     JJW/MEDQ  D:  09/09/2016  T:  09/09/2016  Job:  [id number]

## 2016-09-09 NOTE — Brief Op Note (Signed)
09/09/2016  9:39 AM  PATIENT:  Chad Gomez  71 y.o. male  PRE-OPERATIVE DIAGNOSIS:  RIGHT MID URETERAL STONES  POST-OPERATIVE DIAGNOSIS:  RIGHT MID URETERAL STONES AND PROSTATIC STONES PROCEDURE:  Procedure(s): CYSTOSCOPY WITH REMOVAL OF PROSTATE STONE, CYSTOSCOPY WITH RIGHT  RETROGRADE PYELOGRAM WITH INTERPRETATION, URETEROSCOPY WITH LASER BASKET EXTRACTION OF STONES  AND STENT PLACEMENT HOLMIUM LASER APPLICATION (Right)  SURGEON:  Surgeon(s) and Role:    * Irine Seal, MD - Primary  PHYSICIAN ASSISTANT:   ASSISTANTS: none   ANESTHESIA:   general  EBL:  Total I/O In: 950 [I.V.:950] Out: 5 [Blood:5]  BLOOD ADMINISTERED:none  DRAINS: 6 X 26 RIGHT jj STENT   LOCAL MEDICATIONS USED:  LIDOCAINE  and Amount: 10 ml 2% Jelly  SPECIMEN:  Source of Specimen:  stone fragments  DISPOSITION OF SPECIMEN:  to family  COUNTS:  YES  TOURNIQUET:  * No tourniquets in log *  DICTATION: .Other Dictation: Dictation Number [id number]  PLAN OF CARE: Discharge to home after PACU  PATIENT DISPOSITION:  PACU - hemodynamically stable.   Delay start of Pharmacological VTE agent (>24hrs) due to surgical blood loss or risk of bleeding: not applicable

## 2016-09-09 NOTE — Interval H&P Note (Signed)
History and Physical Interval Note:  09/09/2016 7:25 AM  Chad Gomez  has presented today for surgery, with the diagnosis of RIGHT MID URETERAL STONES  The various methods of treatment have been discussed with the patient and family. After consideration of risks, benefits and other options for treatment, the patient has consented to  Procedure(s): CYSTOSCOPY WITH RIGHT  RETROGRADE PYELOGRAM, URETEROSCOPY WITH LASER  AND STENT PLACEMENT (Right) HOLMIUM LASER APPLICATION (Right) as a surgical intervention .  The patient's history has been reviewed, patient examined, no change in status, stable for surgery.  I have reviewed the patient's chart and labs.  Questions were answered to the patient's satisfaction.     Ioana Louks J

## 2016-09-09 NOTE — Anesthesia Procedure Notes (Signed)
Procedure Name: LMA Insertion Date/Time: 09/09/2016 8:26 AM Performed by: Wanita Chamberlain Pre-anesthesia Checklist: Patient identified, Timeout performed, Emergency Drugs available, Suction available and Patient being monitored Patient Re-evaluated:Patient Re-evaluated prior to inductionOxygen Delivery Method: Circle system utilized Preoxygenation: Pre-oxygenation with 100% oxygen Intubation Type: IV induction Ventilation: Mask ventilation without difficulty LMA: LMA inserted LMA Size: 4.0 Number of attempts: 1 Placement Confirmation: breath sounds checked- equal and bilateral,  CO2 detector and positive ETCO2 Tube secured with: Tape Dental Injury: Teeth and Oropharynx as per pre-operative assessment

## 2016-09-10 ENCOUNTER — Encounter (HOSPITAL_BASED_OUTPATIENT_CLINIC_OR_DEPARTMENT_OTHER): Payer: Self-pay | Admitting: Urology

## 2016-09-18 DIAGNOSIS — N210 Calculus in bladder: Secondary | ICD-10-CM | POA: Diagnosis not present

## 2016-09-18 DIAGNOSIS — N200 Calculus of kidney: Secondary | ICD-10-CM | POA: Diagnosis not present

## 2016-09-18 DIAGNOSIS — N201 Calculus of ureter: Secondary | ICD-10-CM | POA: Diagnosis not present

## 2016-09-25 DIAGNOSIS — N200 Calculus of kidney: Secondary | ICD-10-CM | POA: Diagnosis not present

## 2016-09-29 DIAGNOSIS — N200 Calculus of kidney: Secondary | ICD-10-CM | POA: Diagnosis not present

## 2016-10-14 DIAGNOSIS — M79674 Pain in right toe(s): Secondary | ICD-10-CM | POA: Diagnosis not present

## 2016-10-14 DIAGNOSIS — B351 Tinea unguium: Secondary | ICD-10-CM | POA: Diagnosis not present

## 2016-10-14 DIAGNOSIS — M79675 Pain in left toe(s): Secondary | ICD-10-CM | POA: Diagnosis not present

## 2016-11-05 DIAGNOSIS — N200 Calculus of kidney: Secondary | ICD-10-CM | POA: Diagnosis not present

## 2016-11-13 DIAGNOSIS — H348312 Tributary (branch) retinal vein occlusion, right eye, stable: Secondary | ICD-10-CM | POA: Diagnosis not present

## 2016-11-13 DIAGNOSIS — H2513 Age-related nuclear cataract, bilateral: Secondary | ICD-10-CM | POA: Diagnosis not present

## 2016-11-13 DIAGNOSIS — H3561 Retinal hemorrhage, right eye: Secondary | ICD-10-CM | POA: Diagnosis not present

## 2016-11-13 DIAGNOSIS — H35033 Hypertensive retinopathy, bilateral: Secondary | ICD-10-CM | POA: Diagnosis not present

## 2016-11-25 DIAGNOSIS — N200 Calculus of kidney: Secondary | ICD-10-CM | POA: Diagnosis not present

## 2016-12-08 DIAGNOSIS — R600 Localized edema: Secondary | ICD-10-CM | POA: Diagnosis not present

## 2016-12-25 DIAGNOSIS — K219 Gastro-esophageal reflux disease without esophagitis: Secondary | ICD-10-CM | POA: Diagnosis not present

## 2016-12-25 DIAGNOSIS — R05 Cough: Secondary | ICD-10-CM | POA: Diagnosis not present

## 2016-12-25 DIAGNOSIS — I10 Essential (primary) hypertension: Secondary | ICD-10-CM | POA: Diagnosis not present

## 2017-01-30 DIAGNOSIS — R600 Localized edema: Secondary | ICD-10-CM | POA: Diagnosis not present

## 2017-01-30 DIAGNOSIS — I10 Essential (primary) hypertension: Secondary | ICD-10-CM | POA: Diagnosis not present

## 2017-06-01 ENCOUNTER — Encounter (HOSPITAL_COMMUNITY): Payer: Medicare Other

## 2017-06-01 ENCOUNTER — Ambulatory Visit: Payer: Medicare Other | Admitting: Family

## 2017-08-14 ENCOUNTER — Encounter (HOSPITAL_COMMUNITY): Payer: Medicare Other

## 2017-08-14 ENCOUNTER — Ambulatory Visit: Payer: Medicare Other | Admitting: Family

## 2017-08-24 ENCOUNTER — Ambulatory Visit (INDEPENDENT_AMBULATORY_CARE_PROVIDER_SITE_OTHER)
Admission: RE | Admit: 2017-08-24 | Discharge: 2017-08-24 | Disposition: A | Discharge status: Home / Self Care | Payer: Medicare Other | Source: Ambulatory Visit | Attending: Acute Care | Admitting: Acute Care

## 2017-08-24 DIAGNOSIS — F17210 Nicotine dependence, cigarettes, uncomplicated: Secondary | ICD-10-CM | POA: Diagnosis not present

## 2017-08-26 DIAGNOSIS — Z125 Encounter for screening for malignant neoplasm of prostate: Secondary | ICD-10-CM | POA: Diagnosis not present

## 2017-08-26 DIAGNOSIS — I10 Essential (primary) hypertension: Secondary | ICD-10-CM | POA: Diagnosis not present

## 2017-08-26 DIAGNOSIS — E7800 Pure hypercholesterolemia, unspecified: Secondary | ICD-10-CM | POA: Diagnosis not present

## 2017-08-27 ENCOUNTER — Other Ambulatory Visit: Payer: Self-pay | Admitting: Acute Care

## 2017-08-27 DIAGNOSIS — Z122 Encounter for screening for malignant neoplasm of respiratory organs: Secondary | ICD-10-CM

## 2017-08-27 DIAGNOSIS — F17210 Nicotine dependence, cigarettes, uncomplicated: Principal | ICD-10-CM

## 2017-08-31 DIAGNOSIS — I10 Essential (primary) hypertension: Secondary | ICD-10-CM | POA: Diagnosis not present

## 2017-08-31 DIAGNOSIS — N200 Calculus of kidney: Secondary | ICD-10-CM | POA: Diagnosis not present

## 2017-08-31 DIAGNOSIS — N400 Enlarged prostate without lower urinary tract symptoms: Secondary | ICD-10-CM | POA: Diagnosis not present

## 2017-08-31 DIAGNOSIS — Z0001 Encounter for general adult medical examination with abnormal findings: Secondary | ICD-10-CM | POA: Diagnosis not present

## 2017-09-02 ENCOUNTER — Encounter: Payer: Self-pay | Admitting: Pulmonary Disease

## 2017-09-02 ENCOUNTER — Ambulatory Visit: Payer: Medicare Other | Admitting: Pulmonary Disease

## 2017-09-02 VITALS — BP 118/66 | HR 91 | Ht 70.0 in | Wt 202.0 lb

## 2017-09-02 DIAGNOSIS — R0600 Dyspnea, unspecified: Secondary | ICD-10-CM | POA: Diagnosis not present

## 2017-09-02 DIAGNOSIS — J432 Centrilobular emphysema: Secondary | ICD-10-CM

## 2017-09-02 DIAGNOSIS — F17210 Nicotine dependence, cigarettes, uncomplicated: Secondary | ICD-10-CM | POA: Diagnosis not present

## 2017-09-02 MED ORDER — TIOTROPIUM BROMIDE-OLODATEROL 2.5-2.5 MCG/ACT IN AERS: 2.0000 | INHALATION_SPRAY | Freq: Every day | RESPIRATORY_TRACT | 0 refills | Status: DC

## 2017-09-02 NOTE — Patient Instructions (Addendum)
Severe COPD: Spirometry test today Ambulatory oximetry monitoring today Take steel toe 2 puffs every day no matter how you feel Use albuterol as needed for chest tightness wheezing or shortness of breath  Allergic rhinitis: Use Nasacort two puffs in each nostril once per day.  Remember that the Nasacort can take 1-2 weeks to work after regular use. Use generic zyrtec (cetirizine) every day.  If this doesn't help, then stop taking it and use chlorpheniramine-phenylephrine combination tablets.  Tobacco abuse, cigarette smoking: Continue annual CT scans to screen for lung cancer Quit smoking  Follow-up 3-4 weeks with a nurse practitioner

## 2017-09-02 NOTE — Progress Notes (Signed)
Subjective:   PATIENT ID: Chad Gomez GENDER: male DOB: 08/20/1945, MRN: [id number]  Synopsis: Referred in March 2019 for emphysema  HPI  Chief Complaint  Patient presents with  . Consult    referred by Dr. Shelia Media for emphysema.  c/o sob, prod cough with clear mucus.  CAT: 19.   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    Cough: > he produces a lot of mucus > this has been a problem for many years > he says that after some major abdominal surgery several years ago the cough has been worse > worse when he is smoking and worse in the late afternoon > mucus is clear > no fever, no chills, no unexplained weight loss  No heartburn, no indigestion.  Post nasal drip:  > he says this is a constant problem for him > he doesn't take anything for it > he has been having this since 1968 when he inhaled some hydrochloric acid accidentally > he has been seen by ENT  He has a history of a right vocal cord nodule resected by ENT.    No prior bronchitis or pneumonia.    Past Medical History:  Diagnosis Date  . BPH (benign prostatic hypertrophy)    weak stream  . Chronic kidney disease   . Complication of anesthesia    urinary retention  . Cough productive of clear sputum    ongoing, ENT, PCP aware  . History of bladder stone   . History of colon polyps    08/2013   pre-canerous  . History of  kidney stones   . History of melanoma excision    LEFT EAR  . Hyperlipidemia   . Hypertension   . Iron deficiency   . Open wound    left lower jaw; followed by ENT  . Peripheral vascular disease ([place])   . Right ureteral stone   . Shingles 05-15-2015  . Wears dentures    upper/lower  . Wears glasses      Family History  Problem Relation Age of Onset  . Dementia Mother   . Hypertension Mother   . Stroke Mother 55  . Dementia Father   . Hypertension Sister   . Colon cancer Neg Hx      Social History   Socioeconomic History  . Marital status: Married    Spouse name: Not on file  . Number of children: 2  . Years of education: Not on file  . Highest education level: Not on file  Social Needs  . Financial resource strain: Not on file  . Food insecurity - worry: Not on file  . Food insecurity - inability: Not on file  . Transportation needs - medical: Not on file  . Transportation needs - non-medical: Not on file  Occupational History  .  Not on file  Tobacco Use  . Smoking status: Current Every Day Smoker    Packs/day: 2.00    Years: 50.00    Pack years: 100.00    Types: Cigarettes    Start date: 06/24/1959  . Smokeless tobacco: Never Used  . Tobacco comment: down to 1.5ppd   Substance and Sexual Activity  . Alcohol use: No    Alcohol/week: 0.0 oz  . Drug use: No  . Sexual activity: Not on file  Other Topics Concern  . Not on file  Social History Narrative   Patient is married with 2 children.   Patient is right handed.   Patient has some college education.   Patient does not drink caffeine.     Allergies  Allergen Reactions  . Morphine And Related Anxiety and Other (See Comments)    Combative, incoherent    . Tape Other (See Comments) and Rash    Surgical tape Adhesive tape causes blisters  . Amoxicillin Other (See Comments)    Severe stomach pain  . Naproxen Swelling    Eyes swell     Outpatient Medications Prior to Visit  Medication Sig Dispense  Refill  . amLODipine (NORVASC) 10 MG tablet Take 10 mg by mouth daily.    Marland Kitchen aspirin EC 81 MG tablet Take 81 mg by mouth daily.    . Cholecalciferol (VITAMIN D-3) 1000 units CAPS Take by mouth.    . ezetimibe (ZETIA) 10 MG tablet Take 5 mg by mouth daily.    Marland Kitchen oxyCODONE (ROXICODONE) 5 MG immediate release tablet Take 1 tablet (5 mg total) by mouth every 6 (six) hours as needed for severe pain. 15 tablet 0  . potassium citrate (UROCIT-K) 10 MEQ (1080 MG) SR tablet Take 10 mEq by mouth 3 (three) times daily with meals.     . pravastatin (PRAVACHOL) 20 MG tablet Take 20 mg by mouth daily.    . tamsulosin (FLOMAX) 0.4 MG CAPS capsule Take 0.4 mg by mouth daily.    . phenazopyridine (PYRIDIUM) 200 MG tablet Take 1 tablet (200 mg total) by mouth 3 (three) times daily as needed for pain. (Patient not taking: Reported on 09/02/2017) 15 tablet 1   No facility-administered medications prior to visit.     Review of Systems  Constitutional: Negative for chills, fever, malaise/fatigue and weight loss.  HENT: Negative for congestion, ear pain, nosebleeds, sinus pain and sore throat.   Eyes: Negative for photophobia, pain, discharge and redness.  Respiratory: Positive for cough, shortness of breath and wheezing. Negative for hemoptysis and sputum production.   Cardiovascular: Positive for leg swelling. Negative for chest pain, palpitations, orthopnea and PND.  Gastrointestinal: Negative for abdominal pain, constipation, diarrhea, nausea and vomiting.  Genitourinary: Negative for dysuria, frequency, hematuria and urgency.  Musculoskeletal: Negative for back pain, joint pain, myalgias and neck pain.  Skin: Negative for itching and rash.  Neurological: Negative for tingling, tremors, sensory change, speech change, focal weakness, seizures, weakness and headaches.  Endo/Heme/Allergies: Does not bruise/bleed easily.  Psychiatric/Behavioral: Negative for depression, memory loss, substance abuse and suicidal  ideas. The patient is not nervous/anxious.       Objective:  Physical Exam   Vitals:   09/02/17 1356  BP: 118/66  Pulse: 91  SpO2: 95%  Weight: 202 lb (91.6 kg)  Height: 5\' 10"  (1.778 m)    RA  Gen: chronically ill appearing, no acute distress HENT: NCAT, OP clear, neck supple without masses Eyes: PERRL, EOMi Lymph: no cervical lymphadenopathy  PULM: Wheezing bilaterally CV: RRR, no mgr, no JVD GI: BS+, soft, nontender, no hsm Derm: no rash or skin breakdown MSK: normal bulk and tone Neuro: A&Ox4, CN II-XII intact, strength 5/5 in all 4 extremities Psyche: normal mood and affect   CBC    Component Value Date/Time   WBC 11.9 (H) 01/19/2015 1310   RBC 5.10 01/19/2015 1310   HGB 15.3 09/09/2016 0723   HCT 45.0 09/09/2016 0723   PLT 279 01/19/2015 1310   MCV 88.0 01/19/2015 1310   MCH 30.6 01/19/2015 1310   MCHC 34.7 01/19/2015 1310   RDW 12.5 01/19/2015 1310   LYMPHSABS 1.5 01/26/2013 1025   MONOABS 0.6 01/26/2013 1025   EOSABS 0.2 01/26/2013 1025   BASOSABS 0.1 01/26/2013 1025     Chest imaging: March 2019 CT chest images independently reviewed with the patient today showing centrilobular emphysema   PFT: June 2017 pulmonary function testing ratio 39%, FEV1 0.95 L 29% predicted  Labs:  Path:  Echo:  Heart Catheterization:       Assessment & Plan:   Dyspnea, unspecified type - Plan: Spirometry with Graph  Cigarette smoker  Centrilobular emphysema ([place])  Discussion: Azarius has severe COPD as noted on lung function testing from over a year ago, further the CT scan of his chest this month showed centrilobular emphysema.  This is all the endpoint of ongoing tobacco use.  We talked about the importance of quitting smoking today at length.  He has chronic bronchitis as well.  We talked about how this condition will continue to worsen.  In general, I think that his cough and shortness of breath would improve significantly if he could quit smoking,  and take allergic rhinitis medicines regularly.  We will add Stiolto but I am not sure that this will make much of a difference in terms of the cough.  Plan: Severe COPD: Spirometry test today Ambulatory oximetry monitoring today Take steel toe 2 puffs every day no matter how you feel Use albuterol as needed for chest tightness wheezing or shortness of breath  Allergic rhinitis: Use Nasacort two puffs in each nostril once per day.  Remember that the Nasacort can take 1-2 weeks to work after regular use. Use generic zyrtec (cetirizine) every day.  If this doesn't help, then stop taking it and use chlorpheniramine-phenylephrine combination tablets.  Tobacco abuse, cigarette smoking: Continue annual CT scans to screen for lung cancer Quit smoking  Follow-up 3-4 weeks with a nurse practitioner     Current Outpatient Medications:  .  amLODipine (NORVASC) 10 MG tablet, Take 10 mg by mouth daily., Disp: , Rfl:  .  aspirin EC 81 MG tablet, Take 81 mg by mouth daily., Disp: , Rfl:  .  Cholecalciferol (VITAMIN D-3) 1000 units CAPS, Take by mouth., Disp: , Rfl:  .  ezetimibe (ZETIA) 10 MG tablet, Take 5 mg by mouth daily., Disp: , Rfl:  .  oxyCODONE (ROXICODONE) 5 MG immediate release tablet, Take 1 tablet (5 mg total) by mouth every 6 (six) hours as needed for severe pain., Disp: 15 tablet, Rfl: 0 .  potassium citrate (UROCIT-K) 10 MEQ (1080 MG) SR tablet, Take 10 mEq by mouth 3 (three) times daily with meals. , Disp: , Rfl:  .  pravastatin (PRAVACHOL) 20 MG tablet, Take 20 mg by mouth daily., Disp: , Rfl:  .  tamsulosin (FLOMAX) 0.4 MG CAPS capsule, Take 0.4 mg by mouth daily., Disp: , Rfl:  .  Tiotropium Bromide-Olodaterol (STIOLTO RESPIMAT) 2.5-2.5 MCG/ACT  AERS, Inhale 2 puffs into the lungs daily., Disp: 1 Inhaler, Rfl: 0

## 2017-09-14 ENCOUNTER — Ambulatory Visit: Payer: Medicare Other | Admitting: Family

## 2017-09-14 ENCOUNTER — Ambulatory Visit (HOSPITAL_COMMUNITY)
Admission: RE | Admit: 2017-09-14 | Discharge: 2017-09-14 | Disposition: A | Discharge status: Home / Self Care | Payer: Medicare Other | Source: Ambulatory Visit | Attending: Family | Admitting: Family

## 2017-09-14 ENCOUNTER — Encounter: Payer: Self-pay | Admitting: Family

## 2017-09-14 VITALS — BP 126/86 | HR 82 | Temp 97.9°F | Resp 18 | Ht 70.0 in | Wt 202.2 lb

## 2017-09-14 DIAGNOSIS — F17200 Nicotine dependence, unspecified, uncomplicated: Secondary | ICD-10-CM | POA: Insufficient documentation

## 2017-09-14 DIAGNOSIS — Z95828 Presence of other vascular implants and grafts: Secondary | ICD-10-CM | POA: Diagnosis not present

## 2017-09-14 DIAGNOSIS — I10 Essential (primary) hypertension: Secondary | ICD-10-CM | POA: Insufficient documentation

## 2017-09-14 DIAGNOSIS — R0989 Other specified symptoms and signs involving the circulatory and respiratory systems: Secondary | ICD-10-CM | POA: Diagnosis present

## 2017-09-14 DIAGNOSIS — I779 Disorder of arteries and arterioles, unspecified: Secondary | ICD-10-CM | POA: Diagnosis not present

## 2017-09-14 DIAGNOSIS — I70213 Atherosclerosis of native arteries of extremities with intermittent claudication, bilateral legs: Secondary | ICD-10-CM | POA: Insufficient documentation

## 2017-09-14 DIAGNOSIS — E785 Hyperlipidemia, unspecified: Secondary | ICD-10-CM | POA: Diagnosis not present

## 2017-09-14 DIAGNOSIS — I745 Embolism and thrombosis of iliac artery: Secondary | ICD-10-CM | POA: Insufficient documentation

## 2017-09-14 NOTE — Patient Instructions (Addendum)
Steps to Quit Smoking Smoking tobacco can be bad for your health. It can also affect almost every organ in your body. Smoking puts you and people around you at risk for many serious long-lasting (chronic) diseases. Quitting smoking is hard, but it is one of the best things that you can do for your health. It is never too late to quit. What are the benefits of quitting smoking? When you quit smoking, you lower your risk for getting serious diseases and conditions. They can include:  Lung cancer or lung disease.  Heart disease.  Stroke.  Heart attack.  Not being able to have children (infertility).  Weak bones (osteoporosis) and broken bones (fractures).  If you have coughing, wheezing, and shortness of breath, those symptoms may get better when you quit. You may also get sick less often. If you are pregnant, quitting smoking can help to lower your chances of having a baby of low birth weight. What can I do to help me quit smoking? Talk with your doctor about what can help you quit smoking. Some things you can do (strategies) include:  Quitting smoking totally, instead of slowly cutting back how much you smoke over a period of time.  Going to in-person counseling. You are more likely to quit if you go to many counseling sessions.  Using resources and support systems, such as: ? Online chats with a counselor. ? Phone quitlines. ? Printed self-help materials. ? Support groups or group counseling. ? Text messaging programs. ? Mobile phone apps or applications.  Taking medicines. Some of these medicines may have nicotine in them. If you are pregnant or breastfeeding, do not take any medicines to quit smoking unless your doctor says it is okay. Talk with your doctor about counseling or other things that can help you.  Talk with your doctor about using more than one strategy at the same time, such as taking medicines while you are also going to in-person counseling. This can help make  quitting easier. What things can I do to make it easier to quit? Quitting smoking might feel very hard at first, but there is a lot that you can do to make it easier. Take these steps:  Talk to your family and friends. Ask them to support and encourage you.  Call phone quitlines, reach out to support groups, or work with a counselor.  Ask people who smoke to not smoke around you.  Avoid places that make you want (trigger) to smoke, such as: ? Bars. ? Parties. ? Smoke-break areas at work.  Spend time with people who do not smoke.  Lower the stress in your life. Stress can make you want to smoke. Try these things to help your stress: ? Getting regular exercise. ? Deep-breathing exercises. ? Yoga. ? Meditating. ? Doing a body scan. To do this, close your eyes, focus on one area of your body at a time from head to toe, and notice which parts of your body are tense. Try to relax the muscles in those areas.  Download or buy apps on your mobile phone or tablet that can help you stick to your quit plan. There are many free apps, such as QuitGuide from the CDC (Centers for Disease Control and Prevention). You can find more support from smokefree.gov and other websites.  This information is not intended to replace advice given to you by your health care provider. Make sure you discuss any questions you have with your health care provider. Document Released: 04/05/2009 Document   Revised: 02/05/2016 Document Reviewed: 10/24/2014 Elsevier Interactive Patient Education  2018 [employer]     Peripheral Vascular Disease Peripheral vascular disease (PVD) is a disease of the blood vessels that are not part of your heart and brain. A simple term for PVD is poor circulation. In most cases, PVD narrows the blood vessels that carry blood from your heart to the rest of your body. This can result in a decreased supply of blood to your arms, legs, and internal organs, like your stomach or kidneys.  However, it most often affects a person's lower legs and feet. There are two types of PVD.  Organic PVD. This is the more common type. It is caused by damage to the structure of blood vessels.  Functional PVD. This is caused by conditions that make blood vessels contract and tighten (spasm).  Without treatment, PVD tends to get worse over time. PVD can also lead to acute ischemic limb. This is when an arm or limb suddenly has trouble getting enough blood. This is a medical emergency. Follow these instructions at home:  Take medicines only as told by your doctor.  Do not use any tobacco products, including cigarettes, chewing tobacco, or electronic cigarettes. If you need help quitting, ask your doctor.  Lose weight if you are overweight, and maintain a healthy weight as told by your doctor.  Eat a diet that is low in fat and cholesterol. If you need help, ask your doctor.  Exercise regularly. Ask your doctor for some good activities for you.  Take good care of your feet. ? Wear comfortable shoes that fit well. ? Check your feet often for any cuts or sores. Contact a doctor if:  You have cramps in your legs while walking.  You have leg pain when you are at rest.  You have coldness in a leg or foot.  Your skin changes.  You are unable to get or have an erection (erectile dysfunction).  You have cuts or sores on your feet that are not healing. Get help right away if:  Your arm or leg turns cold and blue.  Your arms or legs become red, warm, swollen, painful, or numb.  You have chest pain or trouble breathing.  You suddenly have weakness in your face, arm, or leg.  You become very confused or you cannot speak.  You suddenly have a very bad headache.  You suddenly cannot see. This information is not intended to replace advice given to you by your health care provider. Make sure you discuss any questions you have with your health care provider. Document Released:  09/03/2009 Document Revised: 11/15/2015 Document Reviewed: 11/17/2013 Elsevier Interactive Patient Education  2017 [employer].  

## 2017-09-14 NOTE — Progress Notes (Signed)
[facility] [place]   CC: Follow up peripheral artery occlusive disease  History of Present Illness Chad Gomez is a 72 y.o. male who is status post aortobifemoral bypass graft using a 16 x 8 bifurcated dacryon graft on 12/29/2014 by Dr. Trula Slade. This was done for left iliac artery occlusion.  The patient's postoperative course was uncomplicated.   Dr. Trula Slade last evaluated pt on 05-21-15. At that time the patient's claudication symptoms had resolved. Overall he was doing very well. Pt was to follow-up in one year with ABIs.  He continues to deny claudication sx's with walking and he does a great deal of walking at [facility]. He does describe intermittent joint pain, and pain in varying locations of his upper and lower body, whether walking or supine or sitting.    He noticed a lower ventral hernia the end of 2016, has not increased in size, is large now, is not painful unless he pushes on it.  He has been coughing for the last 2 years, was diagnosed with emphysema in March 2019, sees Dr. Lake Bells.   He denies any history of stroke, TIA, or MI; denies any know cardiac problems.   Pt Diabetic: No Pt smoker: smoker  (decreased to 1 from 2 ppd x 50+ yrs)  Pt meds include: Statin :Yes Betablocker: No ASA: Yes Other anticoagulants/antiplatelets: no   Past Medical History:  Diagnosis Date  . BPH (benign prostatic hypertrophy)    weak stream  . Chronic kidney disease   . Complication of anesthesia    urinary retention  . Cough productive of clear sputum    ongoing, ENT, PCP aware  . History of bladder stone   . History of colon polyps    08/2013   pre-canerous  . History of kidney stones   . History of melanoma excision    LEFT EAR  . Hyperlipidemia   . Hypertension   . Iron deficiency   . Open wound    left lower jaw; followed by ENT  . Peripheral vascular disease ([place])   . Right ureteral stone   . Shingles 05-15-2015  . Wears dentures     upper/lower  . Wears glasses     Social History Social History   Tobacco Use  . Smoking status: Current Every Day Smoker    Packs/day: 2.00    Years: 50.00    Pack years: 100.00    Types: Cigarettes    Start date: 06/24/1959  . Smokeless tobacco: Never Used  . Tobacco comment: down to 1.5ppd   Substance Use Topics  . Alcohol use: No    Alcohol/week: 0.0 oz  . Drug use: No    Family History Family History  Problem Relation Age of Onset  . Dementia Mother   . Hypertension Mother   . Stroke Mother 78  . Dementia Father   . Hypertension Sister   . Colon cancer Neg Hx     Past Surgical History:  Procedure Laterality Date  . ABDOMINAL AORTAGRAM  11/01/2014   Procedure: Abdominal Aortagram;  Surgeon: Serafina Mitchell, MD;  Location: [place] [facility];  Service: Cardiovascular;;  . AORTA - BILATERAL FEMORAL ARTERY BYPASS GRAFT N/A 12/28/2014   Procedure: AORTOBIFEMORAL BYPASS GRAFT;  Surgeon: Serafina Mitchell, MD;  Location: [place];  Service: Vascular;  Laterality: N/A;  . COLONOSCOPY W/ POLYPECTOMY  09-13-2013  . CYSTOLITHALOPAXY OF BLADDER STONE/ TRANSRECTAL ULTRASOUND PROSTATE BX  08-19-2005  . CYSTOSCOPY W/ RETROGRADES Right 12/02/2012  Procedure: CYSTOSCOPY WITH RETROGRADE PYELOGRAM;  Surgeon: Malka So, MD;  Location: [facility];  Service: Urology;  Laterality: Right;  . CYSTOSCOPY W/ RETROGRADES Bilateral 10/13/2013   Procedure: CYSTOSCOPY WITH RETROGRADE PYELOGRAM;  Surgeon: Irine Seal, MD;  Location: [facility];  Service: Urology;  Laterality: Bilateral;  . CYSTOSCOPY W/ URETERAL STENT PLACEMENT Left 04/11/2014   Procedure: CYSTOSCOPY WITH STENT REPLACEMENT;  Surgeon: Malka So, MD;  Location: [facility];  Service: Urology;  Laterality: Left;  . CYSTOSCOPY WITH LITHOLAPAXY Right 12/02/2012   Procedure: CYSTOSCOPY WITH stone extraction;  Surgeon: Malka So, MD;  Location: [facility];  Service: Urology;   Laterality: Right;  . CYSTOSCOPY WITH LITHOLAPAXY Right 10/13/2013   Procedure: CYSTOSCOPY WITH LITHOLAPAXY, removal of urethral stone with basket;  Surgeon: Irine Seal, MD;  Location: [facility];  Service: Urology;  Laterality: Right;  . CYSTOSCOPY WITH RETROGRADE PYELOGRAM, URETEROSCOPY AND STENT PLACEMENT Left 03/21/2014   Procedure: CYSTOSCOPY WITH RETROGRADE PYELOGRAM, AND LEFT STENT PLACEMENT;  Surgeon: Alexis Frock, MD;  Location: [facility];  Service: Urology;  Laterality: Left;  . CYSTOSCOPY WITH RETROGRADE PYELOGRAM, URETEROSCOPY AND STENT PLACEMENT  09/09/2016   Procedure: CYSTOSCOPY WITH RIGHT  RETROGRADE PYELOGRAM, URETEROSCOPY WITH LASER BASKET EXTRACTION OF STONES  AND STENT PLACEMENT;  Surgeon: Irine Seal, MD;  Location: [facility];  Service: Urology;;  . Consuela Mimes WITH URETEROSCOPY Left 04/11/2014   Procedure: CYSTOSCOPY WITH URETEROSCOPY/ STONE EXTRACTION;  Surgeon: Malka So, MD;  Location: [facility];  Service: Urology;  Laterality: Left;  . HOLMIUM LASER APPLICATION Right 9/62/9528   Procedure: HOLMIUM LASER APPLICATION;  Surgeon: Malka So, MD;  Location: [facility];  Service: Urology;  Laterality: Right;  . HOLMIUM LASER APPLICATION Left 41/32/4401   Procedure: HOLMIUM LASER APPLICATION;  Surgeon: Malka So, MD;  Location: [facility];  Service: Urology;  Laterality: Left;  . HOLMIUM LASER APPLICATION Right 0/27/2536   Procedure: HOLMIUM LASER APPLICATION;  Surgeon: Irine Seal, MD;  Location: [facility];  Service: Urology;  Laterality: Right;  . INGUINAL HERNIA REPAIR Right 1954  . LITHOTRIPSY  2007 & 02/2013  . [place]  . MICROLARYNGOSCOPY Right 02/18/2016   Procedure: MICRO DIRECT LARYNGOSCOPY EXCISIONAL BIOPSY OF RIGHT VOCAL CORD MASS;  Surgeon: Leta Baptist, MD;  Location: [place];  Service: ENT;  Laterality: Right;  . [place]    left ear  . MULTIPLE TOOTH EXTRACTIONS  1994  . NEPHROLITHOTOMY Left 01/27/2013   Procedure: NEPHROLITHOTOMY PERCUTANEOUS FIRST LOOK;  Surgeon: Malka So, MD;  Location: [facility];  Service: Urology;  Laterality: Left;  . PERIPHERAL VASCULAR CATHETERIZATION N/A 11/01/2014   Procedure: Lower Extremity Angiography;  Surgeon: Serafina Mitchell, MD;  Location: [place] [facility];  Service: Cardiovascular;  Laterality: N/A;  . URETEROSCOPY Right 12/02/2012   Procedure: RIGHT URETEROSCOPY STONE EXTRACTION WITH  STENT PLACEMENT;  Surgeon: Malka So, MD;  Location: [facility];  Service: Urology;  Laterality: Right;    Allergies  Allergen Reactions  . Morphine And Related Anxiety and Other (See Comments)    Combative, incoherent    . Tape Other (See Comments) and Rash    Surgical tape Adhesive tape causes blisters  . Amoxicillin Other (See Comments)    Severe stomach pain  . Naproxen Swelling    Eyes swell    Current Outpatient  Medications  Medication Sig Dispense Refill  . amLODipine (NORVASC) 10 MG tablet Take 10 mg by mouth daily.    Marland Kitchen aspirin EC 81 MG tablet Take 81 mg by mouth daily.    . Cetirizine HCl (ZYRTEC PO) Take by mouth daily.    . Cholecalciferol (VITAMIN D-3) 1000 units CAPS Take by mouth.    . ezetimibe (ZETIA) 10 MG tablet Take 5 mg by mouth daily.    . potassium citrate (UROCIT-K) 10 MEQ (1080 MG) SR tablet Take 10 mEq by mouth 3 (three) times daily with meals.     . pravastatin (PRAVACHOL) 20 MG tablet Take 20 mg by mouth daily.    . tamsulosin (FLOMAX) 0.4 MG CAPS capsule Take 0.4 mg by mouth daily.    . Tiotropium Bromide-Olodaterol (STIOLTO RESPIMAT) 2.5-2.5 MCG/ACT AERS Inhale 2 puffs into the lungs daily. 1 Inhaler 0   No current facility-administered medications for this visit.     ROS: See HPI for pertinent positives and negatives.   Physical Examination  Vitals:   09/14/17 1545 09/14/17 1547  BP: 128/77 126/86  Pulse: 82   Resp: 18    Temp: 97.9 F (36.6 C)   TempSrc: Oral   SpO2: 96%   Weight: 202 lb 3.2 oz (91.7 kg)   Height: 5\' 10"  (1.778 m)    Body mass index is 29.01 kg/m.  General: A&O x 3, WDWN, strong odor of stale cigarette smoke on his person. Gait: normal HENT: no gross abnormalities  Eyes: PERRLA. Pulmonary: Respirations with some use of accessory muscles, CTAB, limited air movement in all fields.  Cardiac: regular rhythm, no detected murmur.         Carotid Bruits Right Left   Negative Negative   Abdominal aortic pulse is not palpable. Radial pulses: 2+ palpable and =.                           VASCULAR EXAM: Extremities without ischemic changes, without Gangrene; without open wounds.                                                                                                                                                       LE Pulses Right Left       FEMORAL  2+ palpable  2+ palpable        POPLITEAL  not palpable   not palpable       POSTERIOR TIBIAL  2+ palpable   2+ palpable        DORSALIS PEDIS      ANTERIOR TIBIAL 1+ palpable  1+ palpable    Abdomen: soft, NT, large reducible lower abdominal hernia. Skin: no rashes, no cellulitis, no ulcers noted. Musculoskeletal: no muscle wasting or atrophy.  Neurologic: A&O X 3; appropriate affect, Sensation  is normal; MOTOR FUNCTION:  moving all extremities equally, motor strength 5/5 throughout. Speech is fluent/normal. CN 2-12 intact. Psychiatric: Thought content is normal, mood appropriate for clinical situation.    ASSESSMENT: LANSON RANDLE is a 72 y.o. male who is status post aortobifemoral bypass graft using a 16 x 8 bifurcated dacryon graft on 12/29/2014. This was done for left iliac artery occlusion.  He remains claudication free with walking, has no signs of ischemia in his feet/legs. His pedal pulses are palpable.  ABI's show normal arterial perfusion to both great toes.   Fortunately he does not have  DM. His primary atherosclerotic risk factor is 50+ years of smoking, currently decreased to 1 ppd from 2 ppd, he plans to quit since he was recently diagnosed with emphysema. I congratulated him on his efforts to decrease and plans to quit.   He has a large reducible asymptomatic lower ventral hernia; it hurts a little when he pushes on it. Will refer to [facility] for evaluation.    DATA  ABI (Date: 09/14/2017):  R:   ABI: 1.03 (was 0.98 on 05-26-16),   PT: tri  DP: bi  TBI:  0.94 (was 0.82)  L:   ABI: 1.21 (was 1.06),   PT: tri  DP: bi  TBI: 0.95 (was 0.86)  Improved and normal bilateral ABI and TBI with tri and biphasic waveforms.     PLAN:  Referral to [facility] for evaluation of large lower ventral hernia.  The patient was counseled re smoking cessation and given several free resources re smoking cessation.  Walk at least 30 minutes daily in total.   Based on the patient's vascular studies and examination, pt will return to clinic in 1 year with ABI's. I advised him to notify us should he develop pain or weakness in the muscles of his legs with walking or develops wounds in his feet/legs that have trouble healing.    I discussed in depth with the patient the nature of atherosclerosis, and emphasized the importance of maximal medical management including strict control of blood pressure, blood glucose, and lipid levels, obtaining regular exercise, and cessation of smoking.  The patient is aware that without maximal medical management the underlying atherosclerotic disease process will progress, limiting the benefit of any interventions.  The patient was given information about PAD including signs, symptoms, treatment, what symptoms should prompt the patient to seek immediate medical care, and risk reduction measures to take.  Chad Chambers, RN, MSN, FNP-C [facility] [employer] Phone: [phone]  Clinic MD:  Ardelle Lesches  09/14/17 4:13 PM

## 2017-09-15 ENCOUNTER — Telehealth: Payer: Self-pay | Admitting: Pulmonary Disease

## 2017-09-16 MED ORDER — TIOTROPIUM BROMIDE-OLODATEROL 2.5-2.5 MCG/ACT IN AERS: 2.0000 | INHALATION_SPRAY | Freq: Every day | RESPIRATORY_TRACT | 2 refills | Status: DC

## 2017-09-16 MED ORDER — TIOTROPIUM BROMIDE-OLODATEROL 2.5-2.5 MCG/ACT IN AERS: 2.0000 | INHALATION_SPRAY | Freq: Every day | RESPIRATORY_TRACT | 0 refills | Status: DC

## 2017-09-16 NOTE — Telephone Encounter (Signed)
Patient wanted to see if we would call in the RX for his Stiolto to [facility]. He stated that this has worked really well for him. Patient asked for a sample to last until he receives his shipment. Sample has been placed up front.   He also wanted to know if he should continue the Nasacort. He stated that he has been using this for the past few days and has not noticed a difference. He is wondering if he should stop using it.   Also, he stated that albuterol nebs were mentioned during his last OV. He does not have these at home. He wants to know if we could send in a RX to [facility] as well.   BQ, please advise. Thanks!

## 2017-09-18 MED ORDER — ALBUTEROL SULFATE (2.5 MG/3ML) 0.083% IN NEBU: 2.5000 mg | INHALATION_SOLUTION | Freq: Four times a day (QID) | RESPIRATORY_TRACT | 3 refills | Status: DC | PRN

## 2017-09-18 NOTE — Telephone Encounter (Signed)
Called and spoke with pt's spouse Chad Gomez letting her know BQ stated we could send Rx of stiolto to [facility] as well as going to be sending script of alb neb sol to pharmacy as well.  Stated to [name] for pt to continue using nasacort as it takes a good 2-3 weeks for it to work to become effective if it is used on a regular basis.  [name] expressed understanding. Rx sent to pt's pharmacy. Nothing further needed at this time.

## 2017-09-18 NOTE — Telephone Encounter (Signed)
OK by me to send Stiolto, nasacort takes 2-3 weeks to work with regular use, please sent Rx for albuterol neb

## 2017-09-25 ENCOUNTER — Encounter: Payer: Self-pay | Admitting: Pulmonary Disease

## 2017-09-25 ENCOUNTER — Ambulatory Visit: Payer: Medicare Other | Admitting: Pulmonary Disease

## 2017-09-25 VITALS — BP 132/74 | HR 92 | Ht 70.0 in | Wt 202.0 lb

## 2017-09-25 DIAGNOSIS — R0600 Dyspnea, unspecified: Secondary | ICD-10-CM

## 2017-09-25 DIAGNOSIS — F17210 Nicotine dependence, cigarettes, uncomplicated: Secondary | ICD-10-CM

## 2017-09-25 DIAGNOSIS — J432 Centrilobular emphysema: Secondary | ICD-10-CM | POA: Diagnosis not present

## 2017-09-25 MED ORDER — TIOTROPIUM BROMIDE-OLODATEROL 2.5-2.5 MCG/ACT IN AERS: 2.0000 | INHALATION_SPRAY | Freq: Every day | RESPIRATORY_TRACT | 11 refills | Status: DC

## 2017-09-25 MED ORDER — ALBUTEROL SULFATE HFA 108 (90 BASE) MCG/ACT IN AERS: 2.0000 | INHALATION_SPRAY | Freq: Four times a day (QID) | RESPIRATORY_TRACT | 6 refills | Status: DC | PRN

## 2017-09-25 NOTE — Patient Instructions (Signed)
Severe COPD: Continue using Stiolto 2 puffs daily Use albuterol as needed for chest tightness wheezing or shortness of breath Stay active  Allergic rhinitis: Continue using Zyrtec nightly, Nasacort daily I recommend you start using saline rinses during the afternoon, but if this is not effective then we can call in another nasal spray called Astelin  We will see you back in 6 months or sooner if needed

## 2017-09-25 NOTE — Progress Notes (Signed)
Subjective:   PATIENT ID: Chad Gomez GENDER: male DOB: 02-Nov-1945, MRN: [id number]  Synopsis: Referred in March 2019 for emphysema.  He is still smoking in 2019 and has allergic rhinitis.  HPI  Chief Complaint  Patient presents with  . Follow-up    pt doing well on stiolto, dyspnea is improved.     Chad Gomez says that his breathing is better on Stiolto.  He hasn't needed albuterol except once in the last 7 months.  He is using the Zyrtec at night and it is great, less snoring now which to him means it is helpful.  He is using nasacort in the morning.  He is taking Zyrtec for free from [facility].  He is still smoking.   Past Medical History:  Diagnosis Date  . BPH (benign prostatic hypertrophy)    weak stream  . Chronic kidney disease   . Complication of anesthesia    urinary retention  . Cough productive of clear sputum    ongoing, ENT, PCP aware  . History of bladder stone   . History of colon polyps    08/2013   pre-canerous  . History of kidney stones   . History of melanoma excision    LEFT EAR  . Hyperlipidemia   . Hypertension   . Iron deficiency   . Open wound    left lower jaw; followed by ENT  . Peripheral vascular disease ([place])   . Right ureteral stone   . Shingles 05-15-2015  . Wears dentures    upper/lower  . Wears glasses      Family History  Problem Relation Age of Onset  . Dementia Mother   . Hypertension Mother   . Stroke Mother 75  . Dementia Father   . Hypertension Sister   . Colon cancer Neg Hx       Review of Systems  Constitutional: Negative for chills, fever, malaise/fatigue and weight loss.  HENT: Negative for congestion, ear pain, nosebleeds, sinus pain and sore throat.   Eyes: Negative for photophobia, pain, discharge and redness.  Respiratory: Positive for cough, shortness of breath and wheezing. Negative for hemoptysis and sputum production.   Cardiovascular: Positive for leg swelling. Negative for chest pain,  palpitations, orthopnea and PND.  Gastrointestinal: Negative for abdominal pain, constipation, diarrhea, nausea and vomiting.  Genitourinary: Negative for dysuria, frequency, hematuria and urgency.  Musculoskeletal: Negative for back pain, joint pain, myalgias and neck pain.  Skin: Negative for itching and rash.  Neurological: Negative for tingling, tremors, sensory change, speech change, focal weakness, seizures, weakness and headaches.  Endo/Heme/Allergies: Does not bruise/bleed easily.  Psychiatric/Behavioral: Negative for depression, memory loss, substance abuse and suicidal ideas. The patient is not nervous/anxious.       Objective:  Physical Exam   Vitals:   09/25/17 1440  BP: 132/74  Pulse: 92  SpO2: 92%  Weight: 202 lb (91.6 kg)  Height: 5\' 10"  (1.778 m)    RA  Gen: well appearing HENT: OP clear, TM's clear, neck supple PULM: CTA B, normal percussion CV: RRR, no mgr, trace edema GI: BS+, soft, nontender Derm: no cyanosis or rash Psyche: normal mood and affect   CBC    Component Value Date/Time   WBC 11.9 (H) 01/19/2015 1310   RBC 5.10 01/19/2015 1310   HGB 15.3 09/09/2016 0723   HCT 45.0 09/09/2016 0723   PLT 279 01/19/2015 1310   MCV 88.0 01/19/2015 1310   MCH 30.6 01/19/2015 1310   MCHC  34.7 01/19/2015 1310   RDW 12.5 01/19/2015 1310   LYMPHSABS 1.5 01/26/2013 1025   MONOABS 0.6 01/26/2013 1025   EOSABS 0.2 01/26/2013 1025   BASOSABS 0.1 01/26/2013 1025     Chest imaging: March 2019 CT chest images independently reviewed with the patient today showing centrilobular emphysema   PFT: June 2017 pulmonary function testing ratio 39%, FEV1 0.95 L 29% predicted  Labs:  Path:  Echo:  Heart Catheterization:       Assessment & Plan:   No diagnosis found.  Discussion: This has been a stable interval for him.  Unfortunately he is still smoking cigarettes.  He would like to change his albuterol nebulized back to an [facility] inhaler because he only  uses it sparingly.  Plan: Severe COPD: Continue using Stiolto 2 puffs daily Use albuterol as needed for chest tightness wheezing or shortness of breath Stay active  Allergic rhinitis: Continue using Zyrtec nightly, Nasacort daily I recommend you start using saline rinses during the afternoon, but if this is not effective then we can call in another nasal spray called Astelin  We will see you back in 6 months or sooner if needed  18 minutes spent with patient, 25-minute visit     Current Outpatient Medications:  .  albuterol (PROVENTIL) (2.5 MG/3ML) 0.083% nebulizer solution, Take 3 mLs (2.5 mg total) by nebulization every 6 (six) hours as needed for wheezing or shortness of breath., Disp: 225 mL, Rfl: 3 .  amLODipine (NORVASC) 10 MG tablet, Take 10 mg by mouth daily., Disp: , Rfl:  .  aspirin EC 81 MG tablet, Take 81 mg by mouth daily., Disp: , Rfl:  .  Cetirizine HCl (ZYRTEC PO), Take by mouth daily., Disp: , Rfl:  .  Cholecalciferol (VITAMIN D-3) 1000 units CAPS, Take by mouth., Disp: , Rfl:  .  ezetimibe (ZETIA) 10 MG tablet, Take 5 mg by mouth daily., Disp: , Rfl:  .  potassium citrate (UROCIT-K) 10 MEQ (1080 MG) SR tablet, Take 10 mEq by mouth 3 (three) times daily with meals. , Disp: , Rfl:  .  pravastatin (PRAVACHOL) 20 MG tablet, Take 20 mg by mouth daily., Disp: , Rfl:  .  tamsulosin (FLOMAX) 0.4 MG CAPS capsule, Take 0.4 mg by mouth daily., Disp: , Rfl:  .  Tiotropium Bromide-Olodaterol (STIOLTO RESPIMAT) 2.5-2.5 MCG/ACT AERS, Inhale 2 puffs into the lungs daily., Disp: 3 Inhaler, Rfl: 2 .  Tiotropium Bromide-Olodaterol (STIOLTO RESPIMAT) 2.5-2.5 MCG/ACT AERS, Inhale 2 puffs into the lungs daily., Disp: 1 Inhaler, Rfl: 0

## 2017-10-01 ENCOUNTER — Telehealth: Payer: Self-pay | Admitting: Pulmonary Disease

## 2017-10-01 NOTE — Telephone Encounter (Signed)
Called and spoke to pt. Pt is requesting Stiolto sample because he is waiting on pt assistance forms from [place]. Advised pt that currently we do not have any samples available on the shelf to be logged out but to call back tomorrow and we may have some available. Advised pt that we could also give him BI pt assistance forms so he doesn't have to wait on BI to mail them to him.   Will await pt's call back.

## 2017-10-01 NOTE — Telephone Encounter (Signed)
Spoke with pt. He states that his pharmacy was incorrect on his AVS. This has been corrected in his chart. Nothing further was needed.

## 2017-10-02 MED ORDER — TIOTROPIUM BROMIDE-OLODATEROL 2.5-2.5 MCG/ACT IN AERS: 2.0000 | INHALATION_SPRAY | Freq: Every day | RESPIRATORY_TRACT | 0 refills | Status: DC

## 2017-10-02 NOTE — Telephone Encounter (Signed)
Patient calling to see if we recd any Stiolto samples today, CB is [phone]

## 2017-10-02 NOTE — Telephone Encounter (Signed)
We do have samples of stiolto that I am placing up front for pt to pick up.  Called pt letting him know this information and that I was placing them up front for him to pick up.  Pt expressed understanding and stated he would be by today to get them.  Nothing further needed at this time.

## 2017-12-07 ENCOUNTER — Telehealth: Payer: Self-pay | Admitting: Pulmonary Disease

## 2017-12-07 MED ORDER — ALBUTEROL SULFATE HFA 108 (90 BASE) MCG/ACT IN AERS: 2.0000 | INHALATION_SPRAY | Freq: Four times a day (QID) | RESPIRATORY_TRACT | 6 refills | Status: DC | PRN

## 2017-12-07 NOTE — Telephone Encounter (Signed)
Called pt and spoke with spouse Chad Gomez letting her know I sent a refill of pt's albuterol inhaler to preferred pharmacy.  [name] expressed understanding. Nothing further needed.

## 2018-01-20 DIAGNOSIS — K439 Ventral hernia without obstruction or gangrene: Secondary | ICD-10-CM | POA: Diagnosis not present

## 2018-02-18 DIAGNOSIS — K432 Incisional hernia without obstruction or gangrene: Secondary | ICD-10-CM | POA: Diagnosis not present

## 2018-03-24 ENCOUNTER — Ambulatory Visit: Payer: Medicare Other | Admitting: Pulmonary Disease

## 2018-03-24 ENCOUNTER — Encounter: Payer: Self-pay | Admitting: Pulmonary Disease

## 2018-03-24 VITALS — BP 128/80 | HR 100 | Ht 69.0 in | Wt 204.2 lb

## 2018-03-24 DIAGNOSIS — F17210 Nicotine dependence, cigarettes, uncomplicated: Secondary | ICD-10-CM | POA: Diagnosis not present

## 2018-03-24 DIAGNOSIS — N200 Calculus of kidney: Secondary | ICD-10-CM | POA: Diagnosis not present

## 2018-03-24 DIAGNOSIS — J432 Centrilobular emphysema: Secondary | ICD-10-CM | POA: Diagnosis not present

## 2018-03-24 DIAGNOSIS — J301 Allergic rhinitis due to pollen: Secondary | ICD-10-CM

## 2018-03-24 DIAGNOSIS — N270 Small kidney, unilateral: Secondary | ICD-10-CM | POA: Diagnosis not present

## 2018-03-24 NOTE — Patient Instructions (Signed)
Severe COPD: Stop smoking Continued Stiolto 2 puffs daily Use albuterol as needed for chest tightness wheezing or shortness of breath Practice good hand hygiene Stay active  Cigarette smoking: Its very important for you to quit smoking Review the smoking cessation sheet we gave you  Allergic rhinitis: Continue Nasacort, Zyrtec as you are doing  We will see you back in January 2019 or sooner if needed

## 2018-03-24 NOTE — Progress Notes (Signed)
Subjective:   PATIENT ID: Chad Gomez GENDER: male DOB: 1945-12-15, MRN: [id number]  Synopsis: Referred in March 2019 for emphysema.  He is still smoking in 2019 and has allergic rhinitis.  HPI  Chief Complaint  Patient presents with  . Follow-up    feeling much better - trying to reduce smoking   Terel says his wife started hiding his cigarettes and so now he is down to 1 pack a day.  He says that it has been hard but he is doing OK.  No bronchitis or pneumonia.  He has not been active.  He says that he mostly just lay on the cough and watch television.  He is considering walking.    He doesn't need any refills right now.    He is interested in pulmonary rehab.   Past Medical History:  Diagnosis Date  . BPH (benign prostatic hypertrophy)    weak stream  . Chronic kidney disease   . Complication of anesthesia    urinary retention  . Cough productive of clear sputum    ongoing, ENT, PCP aware  . History of bladder stone   . History of colon polyps    08/2013   pre-canerous  . History of kidney stones   . History of melanoma excision    LEFT EAR  . Hyperlipidemia   . Hypertension   . Iron deficiency   . Open wound    left lower jaw; followed by ENT  . Peripheral vascular disease ([place])   . Right ureteral stone   . Shingles 05-15-2015  . Wears dentures    upper/lower  . Wears glasses      Family History  Problem Relation Age of Onset  . Dementia Mother   . Hypertension Mother   . Stroke Mother 78  . Dementia Father   . Hypertension Sister   . Colon cancer Neg Hx       Review of Systems  Constitutional: Negative for chills, fever, malaise/fatigue and weight loss.  HENT: Negative for congestion, ear pain, nosebleeds, sinus pain and sore throat.   Eyes: Negative for photophobia, pain, discharge and redness.  Respiratory: Positive for cough, shortness of breath and wheezing. Negative for hemoptysis and sputum production.   Cardiovascular: Positive  for leg swelling. Negative for chest pain, palpitations, orthopnea and PND.  Gastrointestinal: Negative for abdominal pain, constipation, diarrhea, nausea and vomiting.  Genitourinary: Negative for dysuria, frequency, hematuria and urgency.  Musculoskeletal: Negative for back pain, joint pain, myalgias and neck pain.  Skin: Negative for itching and rash.  Neurological: Negative for tingling, tremors, sensory change, speech change, focal weakness, seizures, weakness and headaches.  Endo/Heme/Allergies: Does not bruise/bleed easily.  Psychiatric/Behavioral: Negative for depression, memory loss, substance abuse and suicidal ideas. The patient is not nervous/anxious.       Objective:  Physical Exam   Vitals:   03/24/18 1325  BP: 128/80  Pulse: 100  SpO2: 92%  Weight: 204 lb 3.2 oz (92.6 kg)  Height: 5\' 9"  (1.753 m)    RA  Gen: well appearing HENT: OP clear, TM's clear, neck supple PULM: CTA B, normal percussion CV: RRR, no mgr, trace edema GI: BS+, soft, nontender Derm: no cyanosis or rash Psyche: normal mood and affect    CBC    Component Value Date/Time   WBC 11.9 (H) 01/19/2015 1310   RBC 5.10 01/19/2015 1310   HGB 15.3 09/09/2016 0723   HCT 45.0 09/09/2016 0723   PLT 279 01/19/2015  1310   MCV 88.0 01/19/2015 1310   MCH 30.6 01/19/2015 1310   MCHC 34.7 01/19/2015 1310   RDW 12.5 01/19/2015 1310   LYMPHSABS 1.5 01/26/2013 1025   MONOABS 0.6 01/26/2013 1025   EOSABS 0.2 01/26/2013 1025   BASOSABS 0.1 01/26/2013 1025     Chest imaging: March 2019 CT chest images independently reviewed with the patient today showing centrilobular emphysema   PFT: June 2017 pulmonary function testing ratio 39%, FEV1 0.95 L 29% predicted  Labs:  Path:  Echo:  Heart Catheterization:       Assessment & Plan:   Centrilobular emphysema ([place]) - Plan: Ambulatory referral to Physical Therapy  Cigarette smoker  Allergic rhinitis due to pollen, unspecified  seasonality  Discussion: This has been a stable interval for [facility].  He has done a good job on cutting back on cigarettes but he still smoking some.  We need to work with him try to stop smoking altogether.  He is not interested in a flu shot.  Plan: Severe COPD: Stop smoking Continued Stiolto 2 puffs daily Use albuterol as needed for chest tightness wheezing or shortness of breath Practice good hand hygiene Stay active  Cigarette smoking: Its very important for you to quit smoking Review the smoking cessation sheet we gave you  Allergic rhinitis: Continue Nasacort, Zyrtec as you are doing  We will see you back in January 2019 or sooner if needed     Current Outpatient Medications:  .  albuterol (PROVENTIL HFA;VENTOLIN HFA) 108 (90 Base) MCG/ACT inhaler, Inhale 2 puffs into the lungs every 6 (six) hours as needed for wheezing or shortness of breath., Disp: 1 Inhaler, Rfl: 6 .  amLODipine (NORVASC) 10 MG tablet, Take 10 mg by mouth daily., Disp: , Rfl:  .  aspirin EC 81 MG tablet, Take 81 mg by mouth daily., Disp: , Rfl:  .  Cetirizine HCl (ZYRTEC PO), Take by mouth daily., Disp: , Rfl:  .  Cholecalciferol (VITAMIN D-3) 1000 units CAPS, Take by mouth., Disp: , Rfl:  .  ezetimibe (ZETIA) 10 MG tablet, Take 5 mg by mouth daily., Disp: , Rfl:  .  indapamide (LOZOL) 1.25 MG tablet, Take 1.25 mg by mouth daily., Disp: , Rfl:  .  potassium citrate (UROCIT-K) 10 MEQ (1080 MG) SR tablet, Take 10 mEq by mouth 3 (three) times daily with meals. , Disp: , Rfl:  .  pravastatin (PRAVACHOL) 20 MG tablet, Take 20 mg by mouth daily., Disp: , Rfl:  .  tamsulosin (FLOMAX) 0.4 MG CAPS capsule, Take 0.4 mg by mouth daily., Disp: , Rfl:  .  Tiotropium Bromide-Olodaterol (STIOLTO RESPIMAT) 2.5-2.5 MCG/ACT AERS, Inhale 2 puffs into the lungs daily., Disp: 1 Inhaler, Rfl: 11 .  Tiotropium Bromide-Olodaterol (STIOLTO RESPIMAT) 2.5-2.5 MCG/ACT AERS, Inhale 2 puffs into the lungs daily., Disp: 1 Inhaler,  Rfl: 0 .  triamcinolone (NASACORT) 55 MCG/ACT AERO nasal inhaler, Place 1 spray into the nose daily., Disp: , Rfl:

## 2018-03-31 DIAGNOSIS — M6281 Muscle weakness (generalized): Secondary | ICD-10-CM | POA: Diagnosis not present

## 2018-07-27 ENCOUNTER — Encounter: Payer: Self-pay | Admitting: Pulmonary Disease

## 2018-07-27 ENCOUNTER — Ambulatory Visit: Payer: Medicare Other | Admitting: Pulmonary Disease

## 2018-07-27 VITALS — BP 146/78 | HR 87 | Ht 69.0 in | Wt 202.0 lb

## 2018-07-27 DIAGNOSIS — J301 Allergic rhinitis due to pollen: Secondary | ICD-10-CM | POA: Diagnosis not present

## 2018-07-27 DIAGNOSIS — J432 Centrilobular emphysema: Secondary | ICD-10-CM | POA: Diagnosis not present

## 2018-07-27 DIAGNOSIS — F17210 Nicotine dependence, cigarettes, uncomplicated: Secondary | ICD-10-CM

## 2018-07-27 NOTE — Patient Instructions (Signed)
Severe COPD: Stop smoking Continue Stiolto 2 puffs daily Use albuterol as needed for chest tightness wheezing or shortness of breath Stay active, exercise daily Practice hand hygiene  Cigarette smoking: I strongly recommend he quit smoking Continue lung cancer screening  Allergic rhinitis: Continue Nasacort and Zyrtec  We will plan on seeing you back in 6 months or sooner if needed

## 2018-07-27 NOTE — Progress Notes (Signed)
Subjective:   PATIENT ID: Chad Gomez GENDER: male DOB: 1946-05-13, MRN: [id number]  Synopsis: Referred in March 2019 for emphysema.  He is still smoking in 2019 and has allergic rhinitis.  HPI  Chief Complaint  Patient presents with  . Follow-up    pt states he is doing well, at his baseline.     Chad Gomez says he has been doing well since the last visit.  Unfortunately he continues to smoke cigarettes.  He takes Stiolto 2 puffs daily and he says this works fairly well for him.  He is not had trouble with shortness of breath recently.  He wants to start exercising again, he is considering going back to water aerobics.  No recent exacerbations of COPD.  Past Medical History:  Diagnosis Date  . BPH (benign prostatic hypertrophy)    weak stream  . Chronic kidney disease   . Complication of anesthesia    urinary retention  . Cough productive of clear sputum    ongoing, ENT, PCP aware  . History of bladder stone   . History of colon polyps    08/2013   pre-canerous  . History of kidney stones   . History of melanoma excision    LEFT EAR  . Hyperlipidemia   . Hypertension   . Iron deficiency   . Open wound    left lower jaw; followed by ENT  . Peripheral vascular disease (Chad Gomez)   . Right ureteral stone   . Shingles 05-15-2015  . Wears dentures    upper/lower  . Wears glasses      Family History  Problem Relation Age of Onset  . Dementia Mother   . Hypertension Mother   . Stroke Mother 62  . Dementia Father   . Hypertension Sister   . Colon cancer Neg Hx       Review of Systems  Constitutional: Negative for chills, fever, malaise/fatigue and weight loss.  HENT: Negative for congestion, ear pain, nosebleeds, sinus pain and sore throat.   Eyes: Negative for photophobia, pain, discharge and redness.  Respiratory: Positive for cough, shortness of breath and wheezing. Negative for hemoptysis and sputum production.   Cardiovascular: Positive for leg swelling.  Negative for chest pain, palpitations, orthopnea and PND.  Gastrointestinal: Negative for abdominal pain, constipation, diarrhea, nausea and vomiting.  Genitourinary: Negative for dysuria, frequency, hematuria and urgency.  Musculoskeletal: Negative for back pain, joint pain, myalgias and neck pain.  Skin: Negative for itching and rash.  Neurological: Negative for tingling, tremors, sensory change, speech change, focal weakness, seizures, weakness and headaches.  Endo/Heme/Allergies: Does not bruise/bleed easily.  Psychiatric/Behavioral: Negative for depression, memory loss, substance abuse and suicidal ideas. The patient is not nervous/anxious.       Objective:  Physical Exam   Vitals:   07/27/18 0942  BP: (!) 146/78  Pulse: 87  SpO2: 95%  Weight: 202 lb (91.6 kg)  Height: 5\' 9"  (1.753 m)    RA  Gen: chronically ill appearing HENT: OP clear, TM's clear, neck supple PULM: CTA B, normal percussion CV: RRR, no mgr, trace edema GI: BS+, soft, nontender Derm: no cyanosis or rash Psyche: normal mood and affect     CBC    Component Value Date/Time   WBC 11.9 (H) 01/19/2015 1310   RBC 5.10 01/19/2015 1310   HGB 15.3 09/09/2016 0723   HCT 45.0 09/09/2016 0723   PLT 279 01/19/2015 1310   MCV 88.0 01/19/2015 1310   MCH 30.6 01/19/2015  1310   MCHC 34.7 01/19/2015 1310   RDW 12.5 01/19/2015 1310   LYMPHSABS 1.5 01/26/2013 1025   MONOABS 0.6 01/26/2013 1025   EOSABS 0.2 01/26/2013 1025   BASOSABS 0.1 01/26/2013 1025     Chest imaging: March 2019 CT chest images independently reviewed with the patient today showing centrilobular emphysema   PFT: June 2017 pulmonary function testing ratio 39%, FEV1 0.95 L 29% predicted  Labs:  Path:  Echo:  Heart Catheterization:       Assessment & Plan:   Centrilobular emphysema (Chad Gomez)  Cigarette smoker  Allergic rhinitis due to pollen, unspecified seasonality  Discussion: This has been a stable interval for  [facility].  He has not had an exacerbation of his COPD since last visit.  He really needs to quit smoking though, we talked about this again at length today.  He continues to participate in lung cancer screening.  Plan: Severe COPD: Stop smoking Continue Stiolto 2 puffs daily Use albuterol as needed for chest tightness wheezing or shortness of breath Stay active, exercise daily Practice hand hygiene  Cigarette smoking: I strongly recommend he quit smoking Continue lung cancer screening  Allergic rhinitis: Continue Nasacort and Zyrtec  We will plan on seeing you back in 6 months or sooner if needed     Current Outpatient Medications:  .  albuterol (PROVENTIL HFA;VENTOLIN HFA) 108 (90 Base) MCG/ACT inhaler, Inhale 2 puffs into the lungs every 6 (six) hours as needed for wheezing or shortness of breath., Disp: 1 Inhaler, Rfl: 6 .  amLODipine (NORVASC) 10 MG tablet, Take 10 mg by mouth daily., Disp: , Rfl:  .  aspirin EC 81 MG tablet, Take 81 mg by mouth daily., Disp: , Rfl:  .  Cetirizine HCl (ZYRTEC PO), Take by mouth daily., Disp: , Rfl:  .  Cholecalciferol (VITAMIN D-3) 1000 units CAPS, Take by mouth., Disp: , Rfl:  .  ezetimibe (ZETIA) 10 MG tablet, Take 5 mg by mouth daily., Disp: , Rfl:  .  indapamide (LOZOL) 1.25 MG tablet, Take 1.25 mg by mouth daily., Disp: , Rfl:  .  Magnesium 250 MG TABS, Take 1 tablet by mouth daily., Disp: , Rfl:  .  potassium citrate (UROCIT-K) 10 MEQ (1080 MG) SR tablet, Take 10 mEq by mouth 3 (three) times daily with meals. , Disp: , Rfl:  .  pravastatin (PRAVACHOL) 20 MG tablet, Take 20 mg by mouth daily., Disp: , Rfl:  .  tamsulosin (FLOMAX) 0.4 MG CAPS capsule, Take 0.4 mg by mouth daily., Disp: , Rfl:  .  Tiotropium Bromide-Olodaterol (STIOLTO RESPIMAT) 2.5-2.5 MCG/ACT AERS, Inhale 2 puffs into the lungs daily., Disp: 1 Inhaler, Rfl: 11 .  triamcinolone (NASACORT) 55 MCG/ACT AERO nasal inhaler, Place 1 spray into the nose daily., Disp: , Rfl:

## 2018-08-13 ENCOUNTER — Encounter: Payer: Self-pay | Admitting: Acute Care

## 2018-08-30 ENCOUNTER — Telehealth: Payer: Self-pay | Admitting: Pulmonary Disease

## 2018-08-30 NOTE — Telephone Encounter (Signed)
Called patient unable to reach and unable to leave voicemail phone kept ringing.

## 2018-08-30 NOTE — Telephone Encounter (Signed)
Patient wants to ask why Dr. Lake Bells only did 3 refills this year on his Stiolto, he thought he was getting 4.

## 2018-09-01 DIAGNOSIS — R7303 Prediabetes: Secondary | ICD-10-CM | POA: Diagnosis not present

## 2018-09-01 DIAGNOSIS — E7800 Pure hypercholesterolemia, unspecified: Secondary | ICD-10-CM | POA: Diagnosis not present

## 2018-09-01 DIAGNOSIS — I10 Essential (primary) hypertension: Secondary | ICD-10-CM | POA: Diagnosis not present

## 2018-09-01 NOTE — Telephone Encounter (Signed)
Called and spoke with Patient.  Patient stated that he called [facility] about his Stiolto, and was told that he was only given a prescription for 3 refills and could not get his next Stiolto refill until 3/31.  Patient stated he gets 90 days supplies at a time. Patient stated that he thought Dr Lake Bells was sending him 4 refills.  Explained a prescription with 3 refills should cover a year, if it a 90 day supply. Patient stated that he was told by [facility] representative, that additional refill could be called. Per Epic, no Stiolto prescription has been sent, called, or printed. Nothing further at this time.

## 2018-09-06 DIAGNOSIS — I10 Essential (primary) hypertension: Secondary | ICD-10-CM | POA: Diagnosis not present

## 2018-09-06 DIAGNOSIS — Z0001 Encounter for general adult medical examination with abnormal findings: Secondary | ICD-10-CM | POA: Diagnosis not present

## 2018-09-06 DIAGNOSIS — I2510 Atherosclerotic heart disease of native coronary artery without angina pectoris: Secondary | ICD-10-CM | POA: Diagnosis not present

## 2018-12-07 DIAGNOSIS — I10 Essential (primary) hypertension: Secondary | ICD-10-CM | POA: Diagnosis not present

## 2018-12-07 DIAGNOSIS — E7800 Pure hypercholesterolemia, unspecified: Secondary | ICD-10-CM | POA: Diagnosis not present

## 2018-12-07 DIAGNOSIS — E1165 Type 2 diabetes mellitus with hyperglycemia: Secondary | ICD-10-CM | POA: Diagnosis not present

## 2018-12-23 DIAGNOSIS — E113293 Type 2 diabetes mellitus with mild nonproliferative diabetic retinopathy without macular edema, bilateral: Secondary | ICD-10-CM | POA: Diagnosis not present

## 2018-12-23 DIAGNOSIS — H25013 Cortical age-related cataract, bilateral: Secondary | ICD-10-CM | POA: Diagnosis not present

## 2018-12-23 DIAGNOSIS — H35033 Hypertensive retinopathy, bilateral: Secondary | ICD-10-CM | POA: Diagnosis not present

## 2018-12-23 DIAGNOSIS — H2513 Age-related nuclear cataract, bilateral: Secondary | ICD-10-CM | POA: Diagnosis not present

## 2019-01-20 DIAGNOSIS — K122 Cellulitis and abscess of mouth: Secondary | ICD-10-CM | POA: Diagnosis not present

## 2019-02-03 DIAGNOSIS — K122 Cellulitis and abscess of mouth: Secondary | ICD-10-CM | POA: Diagnosis not present

## 2019-03-10 DIAGNOSIS — E113593 Type 2 diabetes mellitus with proliferative diabetic retinopathy without macular edema, bilateral: Secondary | ICD-10-CM | POA: Diagnosis not present

## 2019-03-10 DIAGNOSIS — J439 Emphysema, unspecified: Secondary | ICD-10-CM | POA: Diagnosis not present

## 2019-03-10 DIAGNOSIS — I10 Essential (primary) hypertension: Secondary | ICD-10-CM | POA: Diagnosis not present

## 2019-03-24 DIAGNOSIS — R3121 Asymptomatic microscopic hematuria: Secondary | ICD-10-CM | POA: Diagnosis not present

## 2019-03-24 DIAGNOSIS — R338 Other retention of urine: Secondary | ICD-10-CM | POA: Diagnosis not present

## 2019-03-31 DIAGNOSIS — R338 Other retention of urine: Secondary | ICD-10-CM | POA: Diagnosis not present

## 2019-04-05 DIAGNOSIS — R3121 Asymptomatic microscopic hematuria: Secondary | ICD-10-CM | POA: Diagnosis not present

## 2019-04-05 DIAGNOSIS — N200 Calculus of kidney: Secondary | ICD-10-CM | POA: Diagnosis not present

## 2019-04-25 DIAGNOSIS — R338 Other retention of urine: Secondary | ICD-10-CM | POA: Diagnosis not present

## 2019-05-23 ENCOUNTER — Other Ambulatory Visit: Payer: Self-pay

## 2019-05-23 ENCOUNTER — Encounter: Payer: Self-pay | Admitting: Pulmonary Disease

## 2019-05-23 ENCOUNTER — Ambulatory Visit: Payer: Medicare Other | Admitting: Pulmonary Disease

## 2019-05-23 VITALS — BP 128/74 | HR 94 | Temp 97.5°F | Ht 70.0 in | Wt 185.6 lb

## 2019-05-23 DIAGNOSIS — J449 Chronic obstructive pulmonary disease, unspecified: Secondary | ICD-10-CM | POA: Diagnosis not present

## 2019-05-23 DIAGNOSIS — IMO0001 Reserved for inherently not codable concepts without codable children: Secondary | ICD-10-CM | POA: Insufficient documentation

## 2019-05-23 DIAGNOSIS — R918 Other nonspecific abnormal finding of lung field: Secondary | ICD-10-CM

## 2019-05-23 DIAGNOSIS — R911 Solitary pulmonary nodule: Secondary | ICD-10-CM | POA: Insufficient documentation

## 2019-05-23 DIAGNOSIS — J432 Centrilobular emphysema: Secondary | ICD-10-CM | POA: Diagnosis not present

## 2019-05-23 NOTE — Progress Notes (Signed)
Subjective:   PATIENT ID: Chad Gomez GENDER: male DOB: 04-20-1946, MRN: [id number]   HPI  Chief Complaint  Patient presents with  . Follow-up    former [facility] patient, emphysema, breathing doing a lot better    Reason for Visit: Follow-up   Chad Gomez is a 73 year old male active smoker with very severe COPD/emphysema who presents with follow-up. Wife is present during interview and provides additional history.  He is a former patient of Dr. Lake Bells. He has been compliant and well-controlled on Stiolto. Does not use his albuterol inhaler except with spicy food and reflux. He reports occasional cough but denies chronic cough, shortness of breath or wheezing. Able to walk around at stores. No limitation in activty. He is still an active smoker but has cut down significantly and currently smoking 1 ppd.  Social History: Currently smoker 1ppd. Previously 2ppd.  I have personally reviewed patient's past medical/family/social history, allergies, current medications.  Past Medical History:  Diagnosis Date  . BPH (benign prostatic hypertrophy)    weak stream  . Chronic kidney disease   . Complication of anesthesia    urinary retention  . Cough productive of clear sputum    ongoing, ENT, PCP aware  . History of bladder stone   . History of colon polyps    08/2013   pre-canerous  . History of kidney stones   . History of melanoma excision    LEFT EAR  . Hyperlipidemia   . Hypertension   . Iron deficiency   . Open wound    left lower jaw; followed by ENT  . Peripheral vascular disease ([place])   . Right ureteral stone   . Shingles 05-15-2015  . Wears dentures    upper/lower  . Wears glasses      Family History  Problem Relation Age of Onset  . Dementia Mother   . Hypertension Mother   . Stroke Mother 66  . Dementia Father   . Hypertension Sister   . Colon cancer Neg Hx      Social  History   Occupational History  . Not on file  Tobacco Use  . Smoking status: Current Every Day Smoker    Packs/day: 2.00    Years: 59.00    Pack years: 118.00    Types: Cigarettes    Start date: 06/24/1959  . Smokeless tobacco: Never Used  . Tobacco comment: 05/23/19-1 pack per day  Substance and Sexual Activity  . Alcohol use: No    Alcohol/week: 0.0 standard drinks  . Drug use: No  . Sexual activity: Not on file    Allergies  Allergen Reactions  . Morphine And Related Anxiety and Other (See Comments)    Combative, incoherent    . Tape Other (See Comments) and Rash    Surgical tape Adhesive tape causes blisters  . Amoxicillin Other (See Comments)    Severe stomach pain  . Naproxen Swelling    Eyes swell     Outpatient Medications Prior to Visit  Medication Sig Dispense Refill  . albuterol (PROVENTIL HFA;VENTOLIN HFA) 108 (90 Base) MCG/ACT inhaler Inhale 2 puffs into the lungs every 6 (six) hours as needed for wheezing or shortness of breath. 1 Inhaler 6  . amLODipine (NORVASC) 10 MG tablet Take 10 mg by mouth daily.    Marland Kitchen aspirin EC 81 MG tablet Take 81 mg by mouth daily.    . Cetirizine HCl (ZYRTEC PO) Take by mouth daily.    Marland Kitchen  Cholecalciferol (VITAMIN D-3) 1000 units CAPS Take by mouth.    . ezetimibe (ZETIA) 10 MG tablet Take 5 mg by mouth daily.    . finasteride (PROSCAR) 5 MG tablet Take 5 mg by mouth daily.    . indapamide (LOZOL) 1.25 MG tablet Take 1.25 mg by mouth daily.    . Magnesium 250 MG TABS Take 1 tablet by mouth daily.    . metFORMIN (GLUCOPHAGE-XR) 500 MG 24 hr tablet     . potassium citrate (UROCIT-K) 10 MEQ (1080 MG) SR tablet Take 10 mEq by mouth 3 (three) times daily with meals.     . pravastatin (PRAVACHOL) 20 MG tablet Take 20 mg by mouth daily.    . Tiotropium Bromide-Olodaterol (STIOLTO RESPIMAT) 2.5-2.5 MCG/ACT AERS Inhale 2 puffs into the lungs daily. 1 Inhaler 11  . triamcinolone (NASACORT) 55 MCG/ACT AERO nasal inhaler Place 1 spray into  the nose daily.    . tamsulosin (FLOMAX) 0.4 MG CAPS capsule Take 0.4 mg by mouth daily.     No facility-administered medications prior to visit.     Review of Systems  Constitutional: Negative for chills, diaphoresis, fever, malaise/fatigue and weight loss.  HENT: Positive for congestion. Negative for ear pain and sore throat.   Respiratory: Positive for cough. Negative for hemoptysis, sputum production, shortness of breath and wheezing.   Cardiovascular: Negative for chest pain, palpitations and leg swelling.  Gastrointestinal: Negative for abdominal pain, heartburn and nausea.  Genitourinary: Negative for frequency.  Musculoskeletal: Negative for joint pain and myalgias.  Skin: Negative for itching and rash.  Neurological: Negative for dizziness, weakness and headaches.  Endo/Heme/Allergies: Bruises/bleeds easily.  Psychiatric/Behavioral: Negative for depression. The patient is not nervous/anxious.      Objective:   Vitals:   05/23/19 1054  BP: 128/74  Pulse: 94  Temp: (!) 97.5 F (36.4 C)  TempSrc: Temporal  SpO2: 94%  Weight: 185 lb 9.6 oz (84.2 kg)  Height: 5\' 10"  (1.778 m)   SpO2: 94 % O2 Device: None (Room air)  Physical Exam: General: Well-appearing, no acute distress HENT: [place], AT Eyes: EOMI, no scleral icterus Respiratory: Diminished breath sounds bilaterally.  No crackles, wheezing or rales Cardiovascular: RRR, -M/R/G, no JVD GI: BS+, soft, nontender Extremities:-Edema,-tenderness Neuro: AAO x4, CNII-XII grossly intact Skin: Intact, no rashes or bruising Psych: Normal mood, normal affect  Data Reviewed:  Imaging: CT Chest Lung Screen 08/25/16 - RUL apical lung nodule measuring 1.47mm. RLL 79mm calcified granuloma. Moderate emphysema. CT Chest Lung Screen 08/24/17 - Right apical lung nodule measuring 2.103mm. RLL 1.12mm calcified granuloma. Left apical pleural-parenchymal scarring. Moderate emphysema.  PFT: 09/02/17 Spirometry FVC 1.6 (36%) FEV1 1.0 (31%)  Ratio 63  Interpretation: Very severe obstructive defect  Labs: CBC    Component Value Date/Time   WBC 11.9 (H) 01/19/2015 1310   RBC 5.10 01/19/2015 1310   HGB 15.3 09/09/2016 0723   HCT 45.0 09/09/2016 0723   PLT 279 01/19/2015 1310   MCV 88.0 01/19/2015 1310   MCH 30.6 01/19/2015 1310   MCHC 34.7 01/19/2015 1310   RDW 12.5 01/19/2015 1310   LYMPHSABS 1.5 01/26/2013 1025   MONOABS 0.6 01/26/2013 1025   EOSABS 0.2 01/26/2013 1025   BASOSABS 0.1 01/26/2013 1025   BMET    Component Value Date/Time   NA 142 09/09/2016 0723   K 3.4 (L) 09/09/2016 0723   CL 104 09/09/2016 0723   CO2 28 01/19/2015 1310   GLUCOSE 108 (H) 09/09/2016 0723   BUN 14 09/09/2016  6808   CREATININE 1.00 09/09/2016 0723   CALCIUM 11.1 (H) 01/19/2015 1310   GFRNONAA 46 (L) 01/19/2015 1310   GFRAA 53 (L) 01/19/2015 1310   Imaging, labs and tests noted above have been reviewed independently by me.    Assessment & Plan:   Discussion: 73 year old male with emphysema and very severe COPD who presents for follow-up. Well-controlled on LABA/LAMA.  COPD --Completed and faxed [place] financial aid forms on behalf of patient. --Continue Stioloto as directed  Subcentimeter lung nodule Reviewed imaging from 2018 and 2019, noting increase in size of right apical nodule from 1.19mm to 2.38mm. --CT lung screen in 07/2019 after Feb 3rd  Allergic Rhinitis --CONTINUE Zyrtec daily as needed --CONTINUE Flonase as directed. Consider alternating pointing to your frontal sinuses vs your maxillary sinuses --Nasal rinses as needed before administration for Flonase  Health Maintenance Immunization History  Administered Date(s) Administered  . Pneumococcal Conjugate-13 09/02/2016   CT Lung Screen 07/2019  Orders Placed This Encounter  Procedures  . CT CHEST NODULE FOLLOW UP LOW DOSE W/O    Please schedule for after 07/2019    Standing Status:   Future    Standing Expiration Date:   07/22/2020    Order Specific  Question:   Preferred imaging location?    Answer:   [facility]    Order Specific Question:   Radiology Contrast Protocol - do NOT remove file path    Answer:   [email or web address]  No orders of the defined types were placed in this encounter.   Return in about 3 months (around 08/21/2019).   Greater than 50% of this patient 30-minute office visit was spent face-to-face in counseling with the patient/family. We discussed medical diagnosis and treatment plan as noted.  [place], MD [facility] 05/23/2019 9:59 PM  Office Number [phone]

## 2019-05-23 NOTE — Patient Instructions (Addendum)
Allergic Rhinitis --CONTINUE Zyrtec daily as needed --CONTINUE Flonase as directed. Consider alternating pointing to your frontal sinuses vs your maxillary sinuses --Nasal rinses as needed before administration for Flonase  COPD --We will complete Stioloto forms --Continue Stioloto as directed  Subcentimeter lung nodule --CT lung screen in 07/2019 after Feb 3rd  Follow-up in 3 months

## 2019-05-24 DIAGNOSIS — R338 Other retention of urine: Secondary | ICD-10-CM | POA: Diagnosis not present

## 2019-06-06 ENCOUNTER — Telehealth: Payer: Self-pay | Admitting: Pulmonary Disease

## 2019-06-06 NOTE — Telephone Encounter (Signed)
I received a fax stating patient has been approved from Jun 24 2019- Jun 22 2020 for his patient assistance with Stiolto.

## 2019-06-07 DIAGNOSIS — I739 Peripheral vascular disease, unspecified: Secondary | ICD-10-CM | POA: Diagnosis not present

## 2019-06-07 DIAGNOSIS — E113593 Type 2 diabetes mellitus with proliferative diabetic retinopathy without macular edema, bilateral: Secondary | ICD-10-CM | POA: Diagnosis not present

## 2019-06-07 DIAGNOSIS — I7090 Unspecified atherosclerosis: Secondary | ICD-10-CM | POA: Diagnosis not present

## 2019-06-07 DIAGNOSIS — J309 Allergic rhinitis, unspecified: Secondary | ICD-10-CM | POA: Diagnosis not present

## 2019-06-27 DIAGNOSIS — R338 Other retention of urine: Secondary | ICD-10-CM | POA: Diagnosis not present

## 2019-07-12 ENCOUNTER — Telehealth: Payer: Self-pay | Admitting: Pulmonary Disease

## 2019-07-12 NOTE — Telephone Encounter (Signed)
I called and spoke with the patient and made him aware that it looks like Dr. Loanne Drilling wanted him to have this done in February 2021. I advised him that someone would schedule him once it gets closet to February. He was nervous that it would be looked over like last year.   PCC's, I hate to bother you but if you can please make sure this patient gets scheduled for his Low does CT. Thanks

## 2019-07-14 NOTE — Telephone Encounter (Signed)
Working on this one.  

## 2019-07-14 NOTE — Telephone Encounter (Signed)
I reached out to the patient to let him know I hadn't forgotten about him for his CT.  It isn't due until 08/25/2019.  I did ask Erline Levine to schedule this though since the patient is so anxious.  Pt verbalized understanding & stated nothing further needed at this time.

## 2019-07-18 ENCOUNTER — Other Ambulatory Visit: Payer: Self-pay | Admitting: *Deleted

## 2019-07-18 DIAGNOSIS — Z87891 Personal history of nicotine dependence: Secondary | ICD-10-CM

## 2019-07-18 DIAGNOSIS — F17210 Nicotine dependence, cigarettes, uncomplicated: Secondary | ICD-10-CM

## 2019-07-25 DIAGNOSIS — R338 Other retention of urine: Secondary | ICD-10-CM | POA: Diagnosis not present

## 2019-08-01 ENCOUNTER — Other Ambulatory Visit: Payer: Self-pay

## 2019-08-01 ENCOUNTER — Ambulatory Visit (INDEPENDENT_AMBULATORY_CARE_PROVIDER_SITE_OTHER)
Admission: RE | Admit: 2019-08-01 | Discharge: 2019-08-01 | Disposition: A | Discharge status: Home / Self Care | Payer: Medicare Other | Source: Ambulatory Visit | Attending: Acute Care | Admitting: Acute Care

## 2019-08-01 ENCOUNTER — Telehealth: Payer: Self-pay | Admitting: Acute Care

## 2019-08-01 DIAGNOSIS — F17210 Nicotine dependence, cigarettes, uncomplicated: Secondary | ICD-10-CM

## 2019-08-01 DIAGNOSIS — Z87891 Personal history of nicotine dependence: Secondary | ICD-10-CM

## 2019-08-01 NOTE — Telephone Encounter (Signed)
Received a call report from [place] at [facility] Radiology as follows:  IMPRESSION: 1. Lung-RADS 4B, suspicious bilobed irregular 2.6 cm lesion in the medial left upper lobe. Additional imaging evaluation or consultation with Pulmonology or Thoracic Surgery recommended. 2. Cholelithiasis 3.  Emphysema (ICD10-J43.9) and Aortic Atherosclerosis (ICD10-170.0)

## 2019-08-03 DIAGNOSIS — R338 Other retention of urine: Secondary | ICD-10-CM | POA: Diagnosis not present

## 2019-08-04 ENCOUNTER — Other Ambulatory Visit: Payer: Self-pay | Admitting: Acute Care

## 2019-08-04 DIAGNOSIS — R911 Solitary pulmonary nodule: Secondary | ICD-10-CM

## 2019-08-04 NOTE — Progress Notes (Signed)
I have called these results to the patient. I explained that his scan was read as a lung RADS 4B.  I explained that there was a suspicious by lobe irregular 2.6 cm lesion in his left upper lobe.  I told him that I was going to schedule him for a PET scan, which is imaging to better evaluate the area of concern.  Additionally I told him we would schedule pulmonary function tests to evaluate his lung function.  Previous spirometry done in 2019 does indicate severe obstruction. Additionally I will schedule patient with one of our pulmonary physicians to review the results of his PET scan and appropriate follow-up planning/diagnostics/treatment options with him.  The patient verbalized understanding of the above.  He understands that he will get calls to schedule both the PET scan and the pulmonary function tests.  When asked if he had any questions he stated he did not.  He has our office number if he has questions prior to his diagnostic scans or his office visit.

## 2019-08-05 ENCOUNTER — Other Ambulatory Visit (HOSPITAL_COMMUNITY)
Admission: RE | Admit: 2019-08-05 | Discharge: 2019-08-05 | Disposition: A | Discharge status: Home / Self Care | Payer: Medicare Other | Source: Ambulatory Visit | Attending: Acute Care | Admitting: Acute Care

## 2019-08-05 DIAGNOSIS — Z01812 Encounter for preprocedural laboratory examination: Secondary | ICD-10-CM | POA: Insufficient documentation

## 2019-08-05 DIAGNOSIS — Z20822 Contact with and (suspected) exposure to covid-19: Secondary | ICD-10-CM | POA: Insufficient documentation

## 2019-08-05 LAB — SARS CORONAVIRUS 2 (TAT 6-24 HRS): SARS Coronavirus 2: NEGATIVE

## 2019-08-08 ENCOUNTER — Other Ambulatory Visit: Payer: Self-pay

## 2019-08-08 ENCOUNTER — Ambulatory Visit (INDEPENDENT_AMBULATORY_CARE_PROVIDER_SITE_OTHER): Payer: Medicare Other | Admitting: Pulmonary Disease

## 2019-08-08 DIAGNOSIS — R911 Solitary pulmonary nodule: Secondary | ICD-10-CM

## 2019-08-08 LAB — PULMONARY FUNCTION TEST
DL/VA % pred: 63 %
DL/VA: 2.54 ml/min/mmHg/L
DLCO unc % pred: 64 %
DLCO unc: 15.94 ml/min/mmHg
FEF 25-75 Post: 1.18 L/sec
FEF 25-75 Pre: 0.54 L/sec
FEF2575-%Change-Post: 119 %
FEF2575-%Pred-Post: 51 %
FEF2575-%Pred-Pre: 23 %
FEV1-%Change-Post: 24 %
FEV1-%Pred-Post: 59 %
FEV1-%Pred-Pre: 47 %
FEV1-Post: 1.83 L
FEV1-Pre: 1.46 L
FEV1FVC-%Change-Post: -5 %
FEV1FVC-%Pred-Pre: 72 %
FEV6-%Change-Post: 34 %
FEV6-%Pred-Post: 89 %
FEV6-%Pred-Pre: 66 %
FEV6-Post: 3.53 L
FEV6-Pre: 2.62 L
FEV6FVC-%Change-Post: 2 %
FEV6FVC-%Pred-Post: 102 %
FEV6FVC-%Pred-Pre: 100 %
FVC-%Change-Post: 31 %
FVC-%Pred-Post: 87 %
FVC-%Pred-Pre: 66 %
FVC-Post: 3.65 L
FVC-Pre: 2.77 L
Post FEV1/FVC ratio: 50 %
Post FEV6/FVC ratio: 97 %
Pre FEV1/FVC ratio: 53 %
Pre FEV6/FVC Ratio: 95 %

## 2019-08-08 NOTE — Progress Notes (Signed)
PFT done today. 

## 2019-08-08 NOTE — Telephone Encounter (Signed)
Already called. See the Radiology report for my comments. Thanks

## 2019-08-16 ENCOUNTER — Ambulatory Visit (HOSPITAL_COMMUNITY)
Admission: RE | Admit: 2019-08-16 | Discharge: 2019-08-16 | Disposition: A | Discharge status: Home / Self Care | Payer: Medicare Other | Source: Ambulatory Visit | Attending: Acute Care | Admitting: Acute Care

## 2019-08-16 ENCOUNTER — Other Ambulatory Visit: Payer: Self-pay

## 2019-08-16 DIAGNOSIS — N400 Enlarged prostate without lower urinary tract symptoms: Secondary | ICD-10-CM | POA: Insufficient documentation

## 2019-08-16 DIAGNOSIS — E041 Nontoxic single thyroid nodule: Secondary | ICD-10-CM | POA: Diagnosis not present

## 2019-08-16 DIAGNOSIS — I2510 Atherosclerotic heart disease of native coronary artery without angina pectoris: Secondary | ICD-10-CM | POA: Diagnosis not present

## 2019-08-16 DIAGNOSIS — J439 Emphysema, unspecified: Secondary | ICD-10-CM | POA: Insufficient documentation

## 2019-08-16 DIAGNOSIS — K8020 Calculus of gallbladder without cholecystitis without obstruction: Secondary | ICD-10-CM | POA: Insufficient documentation

## 2019-08-16 DIAGNOSIS — K449 Diaphragmatic hernia without obstruction or gangrene: Secondary | ICD-10-CM | POA: Insufficient documentation

## 2019-08-16 DIAGNOSIS — I700 Atherosclerosis of aorta: Secondary | ICD-10-CM | POA: Diagnosis not present

## 2019-08-16 DIAGNOSIS — L989 Disorder of the skin and subcutaneous tissue, unspecified: Secondary | ICD-10-CM | POA: Insufficient documentation

## 2019-08-16 DIAGNOSIS — R911 Solitary pulmonary nodule: Secondary | ICD-10-CM | POA: Diagnosis not present

## 2019-08-16 LAB — GLUCOSE, CAPILLARY: Glucose-Capillary: 100 mg/dL — ABNORMAL HIGH (ref 70–99)

## 2019-08-16 MED ORDER — FLUDEOXYGLUCOSE F - 18 (FDG) INJECTION
9.2300 | Freq: Once | INTRAVENOUS | Status: AC | PRN
  Administered 2019-08-16: 9.23 via INTRAVENOUS

## 2019-08-18 ENCOUNTER — Telehealth: Payer: Self-pay | Admitting: Acute Care

## 2019-08-18 NOTE — Telephone Encounter (Signed)
I have called the patient with the results of his PET scan. I explained that his PET scan was concerning for lung cancer. I explained that although he saw Dr. Loanne Drilling previously, that her partner Dr. Lamonte Sakai has an opening on his schedule 08/22/2019 at 12 noon, and that I have scheduled him to be seen then. I explained that Dr Loanne Drilling asked me to get him scheduled in the earliest available slot, and that she is aware her will be seeing Dr. Lamonte Sakai as this timing is best for his care. .  Pt. Confirmed he will be here 08/22/2019 at 12 noon to see Dr. Lamonte Sakai. He knows the location of the office. He had no further questions.

## 2019-08-18 NOTE — Progress Notes (Signed)
These results have been called to the patient. He is scheduled to see Dr. Lamonte Sakai 08/22/2019 at 12 noon .

## 2019-08-20 ENCOUNTER — Encounter (HOSPITAL_COMMUNITY): Payer: Self-pay | Admitting: Emergency Medicine

## 2019-08-20 ENCOUNTER — Other Ambulatory Visit: Payer: Self-pay

## 2019-08-20 ENCOUNTER — Emergency Department (HOSPITAL_COMMUNITY)
Admission: EM | Admit: 2019-08-20 | Discharge: 2019-08-20 | Disposition: A | Discharge status: Home / Self Care | Payer: Medicare Other | Attending: Emergency Medicine | Admitting: Emergency Medicine

## 2019-08-20 DIAGNOSIS — J449 Chronic obstructive pulmonary disease, unspecified: Secondary | ICD-10-CM | POA: Diagnosis not present

## 2019-08-20 DIAGNOSIS — R338 Other retention of urine: Secondary | ICD-10-CM

## 2019-08-20 DIAGNOSIS — Z79899 Other long term (current) drug therapy: Secondary | ICD-10-CM | POA: Diagnosis not present

## 2019-08-20 DIAGNOSIS — T83098A Other mechanical complication of other indwelling urethral catheter, initial encounter: Secondary | ICD-10-CM | POA: Diagnosis not present

## 2019-08-20 DIAGNOSIS — I10 Essential (primary) hypertension: Secondary | ICD-10-CM | POA: Diagnosis not present

## 2019-08-20 DIAGNOSIS — R339 Retention of urine, unspecified: Secondary | ICD-10-CM | POA: Diagnosis not present

## 2019-08-20 DIAGNOSIS — T83091A Other mechanical complication of indwelling urethral catheter, initial encounter: Secondary | ICD-10-CM | POA: Diagnosis not present

## 2019-08-20 DIAGNOSIS — E119 Type 2 diabetes mellitus without complications: Secondary | ICD-10-CM | POA: Insufficient documentation

## 2019-08-20 DIAGNOSIS — F17210 Nicotine dependence, cigarettes, uncomplicated: Secondary | ICD-10-CM | POA: Diagnosis not present

## 2019-08-20 LAB — URINALYSIS, ROUTINE W REFLEX MICROSCOPIC
Bacteria, UA: NONE SEEN
Bilirubin Urine: NEGATIVE
Glucose, UA: NEGATIVE mg/dL
Ketones, ur: NEGATIVE mg/dL
Nitrite: NEGATIVE
Protein, ur: NEGATIVE mg/dL
RBC / HPF: >50 RBC/hpf — ABNORMAL HIGH (ref 0–5)
Specific Gravity, Urine: 1.008 (ref 1.005–1.030)
WBC, UA: >50 WBC/hpf — ABNORMAL HIGH (ref 0–5)
pH: 7 (ref 5.0–8.0)

## 2019-08-20 NOTE — ED Triage Notes (Signed)
Patient requesting urinary catheter change due to obstruction/bladder pressure today .

## 2019-08-20 NOTE — ED Provider Notes (Signed)
[facility] EMERGENCY DEPARTMENT Provider Note   CSN: [id number] Arrival date & time: 08/20/19  2126     History Chief Complaint  Patient presents with  . Urinary Catheter Obstructed    Chad Gomez is a 74 y.o. male.  The history is provided by the patient and medical records. No language interpreter was used.     74 year old male with history of BPH, chronic kidney disease, recently diagnosed with lung cancer, presenting for complication of his Foley catheter.  Patient report for the past 6 months he has had recurrent episodes of urinary obstruction and retention requiring insertion of Foley catheter.  He has had this particular Foley catheter for approximately 3 weeks.  He has noticed decreased urine stream for the past few days and today it stopped functioning.  He tries to manipulate the tube with minimal improvement.  He is here with worsening abdominal pressure, moderate in severity due to urinary retention.  His urologist is Dr. Jeffie Pollock.  No complaints of fever.   Past Medical History:  Diagnosis Date  . BPH (benign prostatic hypertrophy)    weak stream  . Chronic kidney disease   . Complication of anesthesia    urinary retention  . Cough productive of clear sputum    ongoing, ENT, PCP aware  . History of bladder stone   . History of colon polyps    08/2013   pre-canerous  . History of kidney stones   . History of melanoma excision    LEFT EAR  . Hyperlipidemia   . Hypertension   . Iron deficiency   . Open wound    left lower jaw; followed by ENT  . Peripheral vascular disease ([place])   . Right ureteral stone   . Shingles 05-15-2015  . Wears dentures    upper/lower  . Wears glasses     Patient Active Problem List   Diagnosis Date Noted  . Centrilobular emphysema ([place]) 05/23/2019  . Lung nodule < 6cm on CT 05/23/2019  . Stage 4 very severe COPD by GOLD classification ([place]) 05/23/2019  . Atherosclerosis of native artery of both lower  extremities with intermittent claudication ([place]) 05/26/2016  . S/P aortobifemoral bypass surgery 05/26/2016  . Post-op pain 01/15/2015  . Aortoiliac occlusive disease ([place]) 12/28/2014  . Preop cardiovascular exam 11/24/2014  . Ureteral stone with hydronephrosis 03/21/2014  . Ureteral stone 03/21/2014  . Ulnar neuropathy 02/07/2014  . BP (high blood pressure) 02/07/2014  . Calculus of kidney 02/07/2014  . Compulsive tobacco user syndrome 02/07/2014  . Allergic rhinitis 02/07/2014  . Malignant melanoma ([place]) 02/07/2014  . Right ureteral stone 10/13/2013  . Bladder stone 10/13/2013    Past Surgical History:  Procedure Laterality Date  . ABDOMINAL AORTAGRAM  11/01/2014   Procedure: Abdominal Aortagram;  Surgeon: Serafina Mitchell, MD;  Location: [facility];  Service: Cardiovascular;;  . AORTA - BILATERAL FEMORAL ARTERY BYPASS GRAFT N/A 12/28/2014   Procedure: AORTOBIFEMORAL BYPASS GRAFT;  Surgeon: Serafina Mitchell, MD;  Location: [facility];  Service: Vascular;  Laterality: N/A;  . COLONOSCOPY W/ POLYPECTOMY  09-13-2013  . CYSTOLITHALOPAXY OF BLADDER STONE/ TRANSRECTAL ULTRASOUND PROSTATE BX  08-19-2005  . CYSTOSCOPY W/ RETROGRADES Right 12/02/2012   Procedure: CYSTOSCOPY WITH RETROGRADE PYELOGRAM;  Surgeon: Malka So, MD;  Location: [facility];  Service: Urology;  Laterality: Right;  . CYSTOSCOPY W/ RETROGRADES Bilateral 10/13/2013   Procedure: CYSTOSCOPY WITH RETROGRADE PYELOGRAM;  Surgeon: Irine Seal, MD;  Location: [facility];  Service: Urology;  Laterality: Bilateral;  . CYSTOSCOPY W/ URETERAL STENT PLACEMENT Left 04/11/2014   Procedure: CYSTOSCOPY WITH STENT REPLACEMENT;  Surgeon: Malka So, MD;  Location[facility];  Service: Urology;  Laterality: Left;  . CYSTOSCOPY WITH LITHOLAPAXY Right 12/02/2012   Procedure: CYSTOSCOPY WITH stone extraction;  Surgeon: Malka So, MD;  Location: [facility];  Service: Urology;   Laterality: Right;  . CYSTOSCOPY WITH LITHOLAPAXY Right 10/13/2013   Procedure: CYSTOSCOPY WITH LITHOLAPAXY, removal of urethral stone with basket;  Surgeon: Irine Seal, MD;  Location: [facility];  Service: Urology;  Laterality: Right;  . CYSTOSCOPY WITH RETROGRADE PYELOGRAM, URETEROSCOPY AND STENT PLACEMENT Left 03/21/2014   Procedure: CYSTOSCOPY WITH RETROGRADE PYELOGRAM, AND LEFT STENT PLACEMENT;  Surgeon: Alexis Frock, MD;  Location: [facility];  Service: Urology;  Laterality: Left;  . CYSTOSCOPY WITH RETROGRADE PYELOGRAM, URETEROSCOPY AND STENT PLACEMENT  09/09/2016   Procedure: CYSTOSCOPY WITH RIGHT  RETROGRADE PYELOGRAM, URETEROSCOPY WITH LASER BASKET EXTRACTION OF STONES  AND STENT PLACEMENT;  Surgeon: Irine Seal, MD;  Location: [facility];  Service: Urology;;  . Consuela Mimes WITH URETEROSCOPY Left 04/11/2014   Procedure: CYSTOSCOPY WITH URETEROSCOPY/ STONE EXTRACTION;  Surgeon: Malka So, MD;  Location: [facility];  Service: Urology;  Laterality: Left;  . HOLMIUM LASER APPLICATION Right 1/88/4166   Procedure: HOLMIUM LASER APPLICATION;  Surgeon: Malka So, MD;  Location: [facility];  Service: Urology;  Laterality: Right;  . HOLMIUM LASER APPLICATION Left 12/21/1599   Procedure: HOLMIUM LASER APPLICATION;  Surgeon: Malka So, MD;  Location: [facility];  Service: Urology;  Laterality: Left;  . HOLMIUM LASER APPLICATION Right 0/93/2355   Procedure: HOLMIUM LASER APPLICATION;  Surgeon: Irine Seal, MD;  Location: [facility];  Service: Urology;  Laterality: Right;  . INGUINAL HERNIA REPAIR Right 1954  . LITHOTRIPSY  2007 & 02/2013  . [place]  . MICROLARYNGOSCOPY Right 02/18/2016   Procedure: MICRO DIRECT LARYNGOSCOPY EXCISIONAL BIOPSY OF RIGHT VOCAL CORD MASS;  Surgeon: Leta Baptist, MD;  Location: [facility];  Service: ENT;  Laterality: Right;  . [place]    left ear  . MULTIPLE TOOTH EXTRACTIONS  1994  . NEPHROLITHOTOMY Left 01/27/2013   Procedure: NEPHROLITHOTOMY PERCUTANEOUS FIRST LOOK;  Surgeon: Malka So, MD;  Location: [facility];  Service: Urology;  Laterality: Left;  . PERIPHERAL VASCULAR CATHETERIZATION N/A 11/01/2014   Procedure: Lower Extremity Angiography;  Surgeon: Serafina Mitchell, MD;  Location: [place] [facility];  Service: Cardiovascular;  Laterality: N/A;  . URETEROSCOPY Right 12/02/2012   Procedure: RIGHT URETEROSCOPY STONE EXTRACTION WITH  STENT PLACEMENT;  Surgeon: Malka So, MD;  Location: [facility];  Service: Urology;  Laterality: Right;       Family History  Problem Relation Age of Onset  . Dementia Mother   . Hypertension Mother   . Stroke Mother 8  . Dementia Father   . Hypertension Sister   . Colon cancer Neg Hx     Social History   Tobacco Use  . Smoking status: Current Every Day Smoker    Packs/day: 2.00    Years: 59.00    Pack years: 118.00    Types: Cigarettes    Start date: 06/24/1959  . Smokeless tobacco: Never Used  . Tobacco comment: 05/23/19-1 pack per day  Substance Use Topics  . Alcohol use: No  Alcohol/week: 0.0 standard drinks  . Drug use: No    Home Medications Prior to Admission medications   Medication Sig Start Date End Date Taking? Authorizing Provider  albuterol (PROVENTIL HFA;VENTOLIN HFA) 108 (90 Base) MCG/ACT inhaler Inhale 2 puffs into the lungs every 6 (six) hours as needed for wheezing or shortness of breath. 12/07/17   Juanito Doom, MD  amLODipine (NORVASC) 10 MG tablet Take 10 mg by mouth daily.    [provider]  aspirin EC 81 MG tablet Take 81 mg by mouth daily.    [provider]  Cetirizine HCl (ZYRTEC PO) Take by mouth daily.    [provider]  Cholecalciferol (VITAMIN D-3) 1000 units CAPS Take by mouth.    [provider]  ezetimibe (ZETIA) 10 MG tablet Take 5 mg by mouth daily.    [provider]  finasteride (PROSCAR) 5 MG tablet Take 5 mg by mouth daily. 04/25/19   [provider]  indapamide (LOZOL) 1.25 MG tablet Take 1.25 mg by mouth daily.    [provider]  Magnesium 250 MG TABS Take 1 tablet by mouth daily.    [provider]  metFORMIN (GLUCOPHAGE-XR) 500 MG 24 hr tablet  04/25/19   [provider]  potassium citrate (UROCIT-K) 10 MEQ (1080 MG) SR tablet Take 10 mEq by mouth 3 (three) times daily with meals.     [provider]  pravastatin (PRAVACHOL) 20 MG tablet Take 20 mg by mouth daily.    [provider]  Tiotropium Bromide-Olodaterol (STIOLTO RESPIMAT) 2.5-2.5 MCG/ACT AERS Inhale 2 puffs into the lungs daily. 09/25/17   Juanito Doom, MD  triamcinolone (NASACORT) 55 MCG/ACT AERO nasal inhaler Place 1 spray into the nose daily.    [provider]    Allergies    Morphine and related, Tape, Amoxicillin, and Naproxen  Review of Systems   Review of Systems  All other systems reviewed and are negative.   Physical Exam Updated Vital Signs BP (!) 150/106 (BP Location: Right Arm)   Pulse (!) 128   Temp 98.2 F (36.8 C) (Oral)   Resp (!) 22   SpO2 97%   Physical Exam Vitals and nursing note reviewed.  Constitutional:      General: He is not in acute distress.    Appearance: He is well-developed.  HENT:     Head: Atraumatic.  Eyes:     Conjunctiva/sclera: Conjunctivae normal.  Abdominal:     Comments: Abdomen is soft with presence of abdominal hernia.  Tenderness to suprapubic region with fullness.  Genitourinary:    Comments: Foley catheter in place with leg bag but no urine in the leg bag. Musculoskeletal:     Cervical back: Neck supple.  Skin:    Findings: No rash.  Neurological:     Mental Status: He is alert.     ED Results / Procedures / Treatments   Labs (all labs ordered are listed, but only abnormal results are displayed) Labs Reviewed  URINALYSIS, ROUTINE W REFLEX  MICROSCOPIC - Abnormal; Notable for the following components:      Result Value   APPearance HAZY (*)    Hgb urine dipstick LARGE (*)    Leukocytes,Ua LARGE (*)    RBC / HPF >50 (*)    WBC, UA >50 (*)    All other components within normal limits  URINE CULTURE    EKG None  Radiology No results found.  Procedures Procedures (including critical care  time)  Medications Ordered in ED Medications - No data to display  ED Course  I have reviewed the triage vital signs and the nursing notes.  Pertinent labs & imaging results that were available during my care of the patient were reviewed by me and considered in my medical decision making (see chart for details).    MDM Rules/Calculators/A&P                      BP 118/81 (BP Location: Right Arm)   Pulse 97   Temp 98.2 F (36.8 C) (Oral)   Resp 16   SpO2 97%   Final Clinical Impression(s) / ED Diagnoses Final diagnoses:  Acute urinary retention  Complication, blocked Foley catheter, initial encounter ([place])    Rx / DC Orders ED Discharge Orders    None     10:00 PM Patient here with urinary retention and a clogged Foley catheter.  Foley catheter has been in place for the past 3 weeks.  History of BPH.  Will exchange catheter.  10:21 PM After catheter exchange, patient put out a moderate amount of urine, felt much better.  Nurse reported that was sediment noted when Foley catheter was removed.  10:55 PM Patient felt much better.  Good urine output in Foley bag.  He is stable for discharge.  Urine culture sent as urine shows signs suggestive of a UTI however due to indwelling Foley catheter I have low suspicion for true urinary tract infection.  Care discussed with Dr. Sherry Ruffing.    Domenic Moras, PA-C 08/20/19 2258    Tegeler, Gwenyth Allegra, MD 08/21/19 [phone]

## 2019-08-22 ENCOUNTER — Other Ambulatory Visit: Payer: Self-pay

## 2019-08-22 ENCOUNTER — Ambulatory Visit: Payer: Medicare Other | Admitting: Emergency Medicine

## 2019-08-22 ENCOUNTER — Encounter: Payer: Self-pay | Admitting: Emergency Medicine

## 2019-08-22 VITALS — BP 106/64 | HR 83 | Temp 97.2°F | Ht 70.0 in | Wt 182.8 lb

## 2019-08-22 DIAGNOSIS — R911 Solitary pulmonary nodule: Secondary | ICD-10-CM

## 2019-08-22 DIAGNOSIS — IMO0001 Reserved for inherently not codable concepts without codable children: Secondary | ICD-10-CM

## 2019-08-22 DIAGNOSIS — J449 Chronic obstructive pulmonary disease, unspecified: Secondary | ICD-10-CM | POA: Diagnosis not present

## 2019-08-22 DIAGNOSIS — Z87442 Personal history of urinary calculi: Secondary | ICD-10-CM | POA: Diagnosis not present

## 2019-08-22 NOTE — Progress Notes (Signed)
Subjective:    Patient ID: Chad Gomez, male    DOB: 30-May-1946, 74 y.o.   MRN: [id number]  HPI 74 year old active smoker (118 pack years), history of hypertension, chronic kidney disease, BPH.  He participates in the lung cancer screening program and had a scan on 08/01/2019 that showed a suspicious bilobed 2.6 cm lesion in the medial left upper lobe.  This prompted a PET scan which confirmed hypermetabolism but no distant disease.  He is referred now to discuss tissue diagnosis.  Pulmonary function testing done 08/08/2019 reviewed by me, shows very severe obstruction with a positive bronchodilator response, FEV1 47% predicted, mildly restricted volumes and a decreased diffusion capacity.  He is on Stiolto, has albuterol which he uses approximately  Review of Systems As per HPI   Past Medical History:  Diagnosis Date  . BPH (benign prostatic hypertrophy)    weak stream  . Chronic kidney disease   . Complication of anesthesia    urinary retention  . Cough productive of clear sputum    ongoing, ENT, PCP aware  . History of bladder stone   . History of colon polyps    08/2013   pre-canerous  . History of kidney stones   . History of melanoma excision    LEFT EAR  . Hyperlipidemia   . Hypertension   . Iron deficiency   . Open wound    left lower jaw; followed by ENT  . Peripheral vascular disease ([place])   . Right ureteral stone   . Shingles 05-15-2015  . Wears dentures    upper/lower  . Wears glasses      Family History  Problem Relation Age of Onset  . Dementia Mother   . Hypertension Mother   . Stroke Mother 36  . Dementia Father   . Hypertension Sister   . Colon cancer Neg Hx      Social History   Socioeconomic History  . Marital status: Married    Spouse name: Not on file  . Number of children: 2  . Years of education: Not on file  . Highest education level: Not on file  Occupational History  . Not on file  Tobacco Use  . Smoking status: Current Every  Day Smoker    Packs/day: 2.00    Years: 59.00    Pack years: 118.00    Types: Cigarettes    Start date: 06/24/1959  . Smokeless tobacco: Never Used  . Tobacco comment: 05/23/19-1 pack per day  Substance and Sexual Activity  . Alcohol use: No    Alcohol/week: 0.0 standard drinks  . Drug use: No  . Sexual activity: Not on file  Other Topics Concern  . Not on file  Social History Narrative   Patient is married with 2 children.   Patient is right handed.   Patient has some college education.   Patient does not drink caffeine.   Social Determinants of Health   Financial Resource Strain:   . Difficulty of Paying Living Expenses: Not on file  Food Insecurity:   . Worried About Charity fundraiser in the Last Year: Not on file  . Ran Out of Food in the Last Year: Not on file  Transportation Needs:   . Lack of Transportation (Medical): Not on file  . Lack of Transportation (Non-Medical): Not on file  Physical Activity:   . Days of Exercise per Week: Not on file  . Minutes of Exercise per Session: Not on file  Stress:   .  Feeling of Stress : Not on file  Social Connections:   . Frequency of Communication with Friends and Family: Not on file  . Frequency of Social Gatherings with Friends and Family: Not on file  . Attends Religious Services: Not on file  . Active Member of Clubs or Organizations: Not on file  . Attends Archivist Meetings: Not on file  . Marital Status: Not on file  Intimate Partner Violence:   . Fear of Current or Ex-Partner: Not on file  . Emotionally Abused: Not on file  . Physically Abused: Not on file  . Sexually Abused: Not on file     Allergies  Allergen Reactions  . Morphine And Related Anxiety and Other (See Comments)    Combative, incoherent    . Tape Other (See Comments) and Rash    Surgical tape Adhesive tape causes blisters  . Amoxicillin Other (See Comments)    Severe stomach pain  . Naproxen Swelling    Eyes swell      Outpatient Medications Prior to Visit  Medication Sig Dispense Refill  . albuterol (PROVENTIL HFA;VENTOLIN HFA) 108 (90 Base) MCG/ACT inhaler Inhale 2 puffs into the lungs every 6 (six) hours as needed for wheezing or shortness of breath. 1 Inhaler 6  . amLODipine (NORVASC) 10 MG tablet Take 10 mg by mouth daily.    Marland Kitchen aspirin EC 81 MG tablet Take 81 mg by mouth daily.    . Cetirizine HCl (ZYRTEC PO) Take by mouth daily.    . Cholecalciferol (VITAMIN D-3) 1000 units CAPS Take by mouth.    . ezetimibe (ZETIA) 10 MG tablet Take 5 mg by mouth daily.    . finasteride (PROSCAR) 5 MG tablet Take 5 mg by mouth daily.    . indapamide (LOZOL) 1.25 MG tablet Take 1.25 mg by mouth daily.    . Magnesium 250 MG TABS Take 1 tablet by mouth daily.    . metFORMIN (GLUCOPHAGE-XR) 500 MG 24 hr tablet     . potassium citrate (UROCIT-K) 10 MEQ (1080 MG) SR tablet Take 10 mEq by mouth 3 (three) times daily with meals.     . pravastatin (PRAVACHOL) 20 MG tablet Take 20 mg by mouth daily.    . Tiotropium Bromide-Olodaterol (STIOLTO RESPIMAT) 2.5-2.5 MCG/ACT AERS Inhale 2 puffs into the lungs daily. 1 Inhaler 11  . triamcinolone (NASACORT) 55 MCG/ACT AERO nasal inhaler Place 1 spray into the nose daily.     No facility-administered medications prior to visit.        Objective:   Physical Exam Vitals:   08/22/19 1213  BP: 106/64  Pulse: 83  Temp: (!) 97.2 F (36.2 C)  TempSrc: Temporal  SpO2: 94%  Weight: 182 lb 12.8 oz (82.9 kg)  Height: 5\' 10"  (1.778 m)   Gen: Pleasant, well-nourished, in no distress,  normal affect  ENT: No lesions,  mouth clear,  oropharynx clear, no postnasal drip  Neck: No JVD, no stridor  Lungs: No use of accessory muscles, very distant, no wheeze  Cardiovascular: RRR, heart sounds normal, no murmur or gallops, traxce peripheral edema  Musculoskeletal: No deformities, no cyanosis or clubbing  Neuro: alert, awake, non focal  Skin: Warm, no lesions or rash       Assessment & Plan:  Stage 4 very severe COPD by GOLD classification ([place]) Please continue Stiolto as you have been taking it Keep albuterol available use 2 puffs if needed for shortness of breath, chest tightness, wheezing. Follow with Dr  Stepheni Cameron in 1 month  Lung nodule < 6cm on CT Very anterior and medial, difficult to reach.  If the airway does not go to it on his Super D Ct then I will refer him to IR for TTNA.   We will perform a super D CT scan of your chest to soon as possible. We will arrange for navigational bronchoscopy to sample your left upper lobe pulmonary nodule.  Dr. Lamonte Sakai will review your repeat CT chest to determine whether there is an airway that will lead to your nodule.  If not then we may decide to cancel the bronchoscopy and instead go with a transthoracic needle biopsy with interventional radiology.  Baltazar Apo, MD, PhD 08/22/2019, 12:47 PM [facility] [phone] or if no answer [phone]

## 2019-08-22 NOTE — Patient Instructions (Signed)
Please continue Stiolto as you have been taking it Keep albuterol available use 2 puffs if needed for shortness of breath, chest tightness, wheezing. We will perform a super D CT scan of your chest to soon as possible. We will arrange for navigational bronchoscopy to sample your left upper lobe pulmonary nodule.  Dr. Lamonte Sakai will review your repeat CT chest to determine whether there is an airway that will lead to your nodule.  If not then we may decide to cancel the bronchoscopy and instead go with a transthoracic needle biopsy with interventional radiology. Follow with Dr Lamonte Sakai in 1 month

## 2019-08-22 NOTE — H&P (View-Only) (Signed)
Subjective:    Patient ID: Chad Gomez, male    DOB: 03/01/1946, 74 y.o.   MRN: [id number]  HPI 74 year old active smoker (118 pack years), history of hypertension, chronic kidney disease, BPH.  He participates in the lung cancer screening program and had a scan on 08/01/2019 that showed a suspicious bilobed 2.6 cm lesion in the medial left upper lobe.  This prompted a PET scan which confirmed hypermetabolism but no distant disease.  He is referred now to discuss tissue diagnosis.  Pulmonary function testing done 08/08/2019 reviewed by me, shows very severe obstruction with a positive bronchodilator response, FEV1 47% predicted, mildly restricted volumes and a decreased diffusion capacity.  He is on Stiolto, has albuterol which he uses approximately  Review of Systems As per HPI   Past Medical History:  Diagnosis Date  . BPH (benign prostatic hypertrophy)    weak stream  . Chronic kidney disease   . Complication of anesthesia    urinary retention  . Cough productive of clear sputum    ongoing, ENT, PCP aware  . History of bladder stone   . History of colon polyps    08/2013   pre-canerous  . History of kidney stones   . History of melanoma excision    LEFT EAR  . Hyperlipidemia   . Hypertension   . Iron deficiency   . Open wound    left lower jaw; followed by ENT  . Peripheral vascular disease ([place])   . Right ureteral stone   . Shingles 05-15-2015  . Wears dentures    upper/lower  . Wears glasses      Family History  Problem Relation Age of Onset  . Dementia Mother   . Hypertension Mother   . Stroke Mother 5  . Dementia Father   . Hypertension Sister   . Colon cancer Neg Hx      Social History   Socioeconomic History  . Marital status: Married    Spouse name: Not on file  . Number of children: 2  . Years of education: Not on file  . Highest education level: Not on file  Occupational History  . Not on file  Tobacco Use  . Smoking status: Current Every  Day Smoker    Packs/day: 2.00    Years: 59.00    Pack years: 118.00    Types: Cigarettes    Start date: 06/24/1959  . Smokeless tobacco: Never Used  . Tobacco comment: 05/23/19-1 pack per day  Substance and Sexual Activity  . Alcohol use: No    Alcohol/week: 0.0 standard drinks  . Drug use: No  . Sexual activity: Not on file  Other Topics Concern  . Not on file  Social History Narrative   Patient is married with 2 children.   Patient is right handed.   Patient has some college education.   Patient does not drink caffeine.   Social Determinants of Health   Financial Resource Strain:   . Difficulty of Paying Living Expenses: Not on file  Food Insecurity:   . Worried About Charity fundraiser in the Last Year: Not on file  . Ran Out of Food in the Last Year: Not on file  Transportation Needs:   . Lack of Transportation (Medical): Not on file  . Lack of Transportation (Non-Medical): Not on file  Physical Activity:   . Days of Exercise per Week: Not on file  . Minutes of Exercise per Session: Not on file  Stress:   .  Feeling of Stress : Not on file  Social Connections:   . Frequency of Communication with Friends and Family: Not on file  . Frequency of Social Gatherings with Friends and Family: Not on file  . Attends Religious Services: Not on file  . Active Member of Clubs or Organizations: Not on file  . Attends Archivist Meetings: Not on file  . Marital Status: Not on file  Intimate Partner Violence:   . Fear of Current or Ex-Partner: Not on file  . Emotionally Abused: Not on file  . Physically Abused: Not on file  . Sexually Abused: Not on file     Allergies  Allergen Reactions  . Morphine And Related Anxiety and Other (See Comments)    Combative, incoherent    . Tape Other (See Comments) and Rash    Surgical tape Adhesive tape causes blisters  . Amoxicillin Other (See Comments)    Severe stomach pain  . Naproxen Swelling    Eyes swell      Outpatient Medications Prior to Visit  Medication Sig Dispense Refill  . albuterol (PROVENTIL HFA;VENTOLIN HFA) 108 (90 Base) MCG/ACT inhaler Inhale 2 puffs into the lungs every 6 (six) hours as needed for wheezing or shortness of breath. 1 Inhaler 6  . amLODipine (NORVASC) 10 MG tablet Take 10 mg by mouth daily.    Marland Kitchen aspirin EC 81 MG tablet Take 81 mg by mouth daily.    . Cetirizine HCl (ZYRTEC PO) Take by mouth daily.    . Cholecalciferol (VITAMIN D-3) 1000 units CAPS Take by mouth.    . ezetimibe (ZETIA) 10 MG tablet Take 5 mg by mouth daily.    . finasteride (PROSCAR) 5 MG tablet Take 5 mg by mouth daily.    . indapamide (LOZOL) 1.25 MG tablet Take 1.25 mg by mouth daily.    . Magnesium 250 MG TABS Take 1 tablet by mouth daily.    . metFORMIN (GLUCOPHAGE-XR) 500 MG 24 hr tablet     . potassium citrate (UROCIT-K) 10 MEQ (1080 MG) SR tablet Take 10 mEq by mouth 3 (three) times daily with meals.     . pravastatin (PRAVACHOL) 20 MG tablet Take 20 mg by mouth daily.    . Tiotropium Bromide-Olodaterol (STIOLTO RESPIMAT) 2.5-2.5 MCG/ACT AERS Inhale 2 puffs into the lungs daily. 1 Inhaler 11  . triamcinolone (NASACORT) 55 MCG/ACT AERO nasal inhaler Place 1 spray into the nose daily.     No facility-administered medications prior to visit.        Objective:   Physical Exam Vitals:   08/22/19 1213  BP: 106/64  Pulse: 83  Temp: (!) 97.2 F (36.2 C)  TempSrc: Temporal  SpO2: 94%  Weight: 182 lb 12.8 oz (82.9 kg)  Height: 5\' 10"  (1.778 m)   Gen: Pleasant, well-nourished, in no distress,  normal affect  ENT: No lesions,  mouth clear,  oropharynx clear, no postnasal drip  Neck: No JVD, no stridor  Lungs: No use of accessory muscles, very distant, no wheeze  Cardiovascular: RRR, heart sounds normal, no murmur or gallops, traxce peripheral edema  Musculoskeletal: No deformities, no cyanosis or clubbing  Neuro: alert, awake, non focal  Skin: Warm, no lesions or rash       Assessment & Plan:  Stage 4 very severe COPD by GOLD classification ([place]) Please continue Stiolto as you have been taking it Keep albuterol available use 2 puffs if needed for shortness of breath, chest tightness, wheezing. Follow with Dr  Pepe Mineau in 1 month  Lung nodule < 6cm on CT Very anterior and medial, difficult to reach.  If the airway does not go to it on his Super D Ct then I will refer him to IR for TTNA.   We will perform a super D CT scan of your chest to soon as possible. We will arrange for navigational bronchoscopy to sample your left upper lobe pulmonary nodule.  Dr. Lamonte Sakai will review your repeat CT chest to determine whether there is an airway that will lead to your nodule.  If not then we may decide to cancel the bronchoscopy and instead go with a transthoracic needle biopsy with interventional radiology.  Baltazar Apo, MD, PhD 08/22/2019, 12:47 PM [facility] [phone] or if no answer [phone]

## 2019-08-22 NOTE — Assessment & Plan Note (Signed)
Very anterior and medial, difficult to reach.  If the airway does not go to it on his Super D Ct then I will refer him to IR for TTNA.   We will perform a super D CT scan of your chest to soon as possible. We will arrange for navigational bronchoscopy to sample your left upper lobe pulmonary nodule.  Dr. Lamonte Sakai will review your repeat CT chest to determine whether there is an airway that will lead to your nodule.  If not then we may decide to cancel the bronchoscopy and instead go with a transthoracic needle biopsy with interventional radiology.

## 2019-08-22 NOTE — Assessment & Plan Note (Signed)
Please continue Stiolto as you have been taking it Keep albuterol available use 2 puffs if needed for shortness of breath, chest tightness, wheezing. Follow with Dr Lamonte Sakai in 1 month

## 2019-08-23 LAB — URINE CULTURE: Culture: >=100000 — AB

## 2019-08-24 ENCOUNTER — Telehealth: Payer: Self-pay

## 2019-08-24 NOTE — Telephone Encounter (Signed)
NO abx needed for UC done ED 08/20/19 per Eustaquio Maize PA

## 2019-08-29 DIAGNOSIS — R338 Other retention of urine: Secondary | ICD-10-CM | POA: Diagnosis not present

## 2019-08-29 DIAGNOSIS — N200 Calculus of kidney: Secondary | ICD-10-CM | POA: Diagnosis not present

## 2019-08-30 ENCOUNTER — Emergency Department (HOSPITAL_COMMUNITY)
Admission: EM | Admit: 2019-08-30 | Discharge: 2019-08-30 | Disposition: A | Discharge status: Home / Self Care | Payer: Medicare Other | Attending: Emergency Medicine | Admitting: Emergency Medicine

## 2019-08-30 ENCOUNTER — Other Ambulatory Visit: Payer: Self-pay

## 2019-08-30 ENCOUNTER — Encounter (HOSPITAL_COMMUNITY): Payer: Self-pay | Admitting: Emergency Medicine

## 2019-08-30 DIAGNOSIS — I129 Hypertensive chronic kidney disease with stage 1 through stage 4 chronic kidney disease, or unspecified chronic kidney disease: Secondary | ICD-10-CM | POA: Insufficient documentation

## 2019-08-30 DIAGNOSIS — F17210 Nicotine dependence, cigarettes, uncomplicated: Secondary | ICD-10-CM | POA: Insufficient documentation

## 2019-08-30 DIAGNOSIS — Z7982 Long term (current) use of aspirin: Secondary | ICD-10-CM | POA: Diagnosis not present

## 2019-08-30 DIAGNOSIS — N401 Enlarged prostate with lower urinary tract symptoms: Secondary | ICD-10-CM | POA: Diagnosis not present

## 2019-08-30 DIAGNOSIS — I10 Essential (primary) hypertension: Secondary | ICD-10-CM | POA: Diagnosis not present

## 2019-08-30 DIAGNOSIS — R339 Retention of urine, unspecified: Secondary | ICD-10-CM | POA: Insufficient documentation

## 2019-08-30 DIAGNOSIS — Z79899 Other long term (current) drug therapy: Secondary | ICD-10-CM | POA: Insufficient documentation

## 2019-08-30 DIAGNOSIS — N189 Chronic kidney disease, unspecified: Secondary | ICD-10-CM | POA: Insufficient documentation

## 2019-08-30 DIAGNOSIS — R338 Other retention of urine: Secondary | ICD-10-CM

## 2019-08-30 LAB — URINALYSIS, ROUTINE W REFLEX MICROSCOPIC
Bacteria, UA: NONE SEEN
Bilirubin Urine: NEGATIVE
Glucose, UA: NEGATIVE mg/dL
Ketones, ur: NEGATIVE mg/dL
Nitrite: NEGATIVE
Protein, ur: NEGATIVE mg/dL
Specific Gravity, Urine: 1.004 — ABNORMAL LOW (ref 1.005–1.030)
pH: 7 (ref 5.0–8.0)

## 2019-08-30 LAB — URINE CULTURE: Culture: <10000 — AB

## 2019-08-30 NOTE — ED Provider Notes (Signed)
[facility] EMERGENCY DEPARTMENT Provider Note   CSN: [id number] Arrival date & time: 08/30/19  0345   History Chief Complaint  Patient presents with  . urine retention    Chad Gomez is a 74 y.o. male.  The history is provided by the patient.  He has history of hypertension, hyperlipidemia, prostatic hypertrophy, chronic kidney disease and comes in because of inability to urinate.  He has had a Foley catheter almost constantly for the last 6 months.  Catheter has been removed this morning and he was able to urinate in the doctor's office and also several times after getting home.  Last time he was able to urinate was 4 PM.  Since then, he has had the urge to go but has not been able to urinate anything other than a few drops at a time.  Past Medical History:  Diagnosis Date  . BPH (benign prostatic hypertrophy)    weak stream  . Chronic kidney disease   . Complication of anesthesia    urinary retention  . Cough productive of clear sputum    ongoing, ENT, PCP aware  . History of bladder stone   . History of colon polyps    08/2013   pre-canerous  . History of kidney stones   . History of melanoma excision    LEFT EAR  . Hyperlipidemia   . Hypertension   . Iron deficiency   . Open wound    left lower jaw; followed by ENT  . Peripheral vascular disease ([place])   . Right ureteral stone   . Shingles 05-15-2015  . Wears dentures    upper/lower  . Wears glasses     Patient Active Problem List   Diagnosis Date Noted  . Centrilobular emphysema ([place]) 05/23/2019  . Lung nodule < 6cm on CT 05/23/2019  . Stage 4 very severe COPD by GOLD classification ([place]) 05/23/2019  . Atherosclerosis of native artery of both lower extremities with intermittent claudication ([place]) 05/26/2016  . S/P aortobifemoral bypass surgery 05/26/2016  . Post-op pain 01/15/2015  . Aortoiliac occlusive disease ([place]) 12/28/2014  . Preop cardiovascular exam 11/24/2014  . Ureteral  stone with hydronephrosis 03/21/2014  . Ureteral stone 03/21/2014  . Ulnar neuropathy 02/07/2014  . BP (high blood pressure) 02/07/2014  . Calculus of kidney 02/07/2014  . Compulsive tobacco user syndrome 02/07/2014  . Allergic rhinitis 02/07/2014  . Malignant melanoma ([place]) 02/07/2014  . Right ureteral stone 10/13/2013  . Bladder stone 10/13/2013    Past Surgical History:  Procedure Laterality Date  . ABDOMINAL AORTAGRAM  11/01/2014   Procedure: Abdominal Aortagram;  Surgeon: Serafina Mitchell, MD;  Location: [facility];  Service: Cardiovascular;;  . AORTA - BILATERAL FEMORAL ARTERY BYPASS GRAFT N/A 12/28/2014   Procedure: AORTOBIFEMORAL BYPASS GRAFT;  Surgeon: Serafina Mitchell, MD;  Location: [place];  Service: Vascular;  Laterality: N/A;  . COLONOSCOPY W/ POLYPECTOMY  09-13-2013  . CYSTOLITHALOPAXY OF BLADDER STONE/ TRANSRECTAL ULTRASOUND PROSTATE BX  08-19-2005  . CYSTOSCOPY W/ RETROGRADES Right 12/02/2012   Procedure: CYSTOSCOPY WITH RETROGRADE PYELOGRAM;  Surgeon: Malka So, MD;  Location: [facility];  Service: Urology;  Laterality: Right;  . CYSTOSCOPY W/ RETROGRADES Bilateral 10/13/2013   Procedure: CYSTOSCOPY WITH RETROGRADE PYELOGRAM;  Surgeon: Irine Seal, MD;  Location: [facility];  Service: Urology;  Laterality: Bilateral;  . CYSTOSCOPY W/ URETERAL STENT PLACEMENT Left 04/11/2014   Procedure: CYSTOSCOPY WITH STENT REPLACEMENT;  Surgeon: Malka So, MD;  Location: [place];  Service: Urology;  Laterality: Left;  . CYSTOSCOPY WITH LITHOLAPAXY Right 12/02/2012   Procedure: CYSTOSCOPY WITH stone extraction;  Surgeon: Malka So, MD;  Location: [facility];  Service: Urology;  Laterality: Right;  . CYSTOSCOPY WITH LITHOLAPAXY Right 10/13/2013   Procedure: CYSTOSCOPY WITH LITHOLAPAXY, removal of urethral stone with basket;  Surgeon: Irine Seal, MD;  Location: [facility];  Service: Urology;   Laterality: Right;  . CYSTOSCOPY WITH RETROGRADE PYELOGRAM, URETEROSCOPY AND STENT PLACEMENT Left 03/21/2014   Procedure: CYSTOSCOPY WITH RETROGRADE PYELOGRAM, AND LEFT STENT PLACEMENT;  Surgeon: Alexis Frock, MD;  Location: [facility];  Service: Urology;  Laterality: Left;  . CYSTOSCOPY WITH RETROGRADE PYELOGRAM, URETEROSCOPY AND STENT PLACEMENT  09/09/2016   Procedure: CYSTOSCOPY WITH RIGHT  RETROGRADE PYELOGRAM, URETEROSCOPY WITH LASER BASKET EXTRACTION OF STONES  AND STENT PLACEMENT;  Surgeon: Irine Seal, MD;  Location: [facility];  Service: Urology;;  . Consuela Mimes WITH URETEROSCOPY Left 04/11/2014   Procedure: CYSTOSCOPY WITH URETEROSCOPY/ STONE EXTRACTION;  Surgeon: Malka So, MD;  Location: [facility];  Service: Urology;  Laterality: Left;  . HOLMIUM LASER APPLICATION Right 2/95/6213   Procedure: HOLMIUM LASER APPLICATION;  Surgeon: Malka So, MD;  Location: [facility];  Service: Urology;  Laterality: Right;  . HOLMIUM LASER APPLICATION Left 08/65/7846   Procedure: HOLMIUM LASER APPLICATION;  Surgeon: Malka So, MD;  Location: [facility];  Service: Urology;  Laterality: Left;  . HOLMIUM LASER APPLICATION Right 9/62/9528   Procedure: HOLMIUM LASER APPLICATION;  Surgeon: Irine Seal, MD;  Location: [facility];  Service: Urology;  Laterality: Right;  . INGUINAL HERNIA REPAIR Right 1954  . LITHOTRIPSY  2007 & 02/2013  . [place]  . MICROLARYNGOSCOPY Right 02/18/2016   Procedure: MICRO DIRECT LARYNGOSCOPY EXCISIONAL BIOPSY OF RIGHT VOCAL CORD MASS;  Surgeon: Leta Baptist, MD;  Location: [place];  Service: ENT;  Laterality: Right;  . [place]   left ear  . MULTIPLE TOOTH EXTRACTIONS  1994  . NEPHROLITHOTOMY Left 01/27/2013   Procedure: NEPHROLITHOTOMY PERCUTANEOUS FIRST LOOK;  Surgeon: Malka So, MD;  Location: [facility];  Service: Urology;  Laterality: Left;  .  PERIPHERAL VASCULAR CATHETERIZATION N/A 11/01/2014   Procedure: Lower Extremity Angiography;  Surgeon: Serafina Mitchell, MD;  Location: [place] [facility];  Service: Cardiovascular;  Laterality: N/A;  . URETEROSCOPY Right 12/02/2012   Procedure: RIGHT URETEROSCOPY STONE EXTRACTION WITH  STENT PLACEMENT;  Surgeon: Malka So, MD;  Location: [facility];  Service: Urology;  Laterality: Right;       Family History  Problem Relation Age of Onset  . Dementia Mother   . Hypertension Mother   . Stroke Mother 28  . Dementia Father   . Hypertension Sister   . Colon cancer Neg Hx     Social History   Tobacco Use  . Smoking status: Current Every Day Smoker    Packs/day: 2.00    Years: 59.00    Pack years: 118.00    Types: Cigarettes    Start date: 06/24/1959  . Smokeless tobacco: Never Used  . Tobacco comment: 05/23/19-1 pack per day  Substance Use Topics  . Alcohol use: No    Alcohol/week: 0.0 standard drinks  . Drug use: No    Home Medications Prior to Admission medications   Medication Sig Start Date End Date Taking? Authorizing  Provider  albuterol (PROVENTIL HFA;VENTOLIN HFA) 108 (90 Base) MCG/ACT inhaler Inhale 2 puffs into the lungs every 6 (six) hours as needed for wheezing or shortness of breath. 12/07/17   Juanito Doom, MD  amLODipine (NORVASC) 10 MG tablet Take 10 mg by mouth daily.    [provider]  aspirin EC 81 MG tablet Take 81 mg by mouth daily.    [provider]  Cetirizine HCl (ZYRTEC PO) Take by mouth daily.    [provider]  Cholecalciferol (VITAMIN D-3) 1000 units CAPS Take by mouth.    [provider]  ezetimibe (ZETIA) 10 MG tablet Take 5 mg by mouth daily.    [provider]  finasteride (PROSCAR) 5 MG tablet Take 5 mg by mouth daily. 04/25/19   [provider]  indapamide (LOZOL) 1.25 MG tablet Take 1.25 mg by mouth daily.    [provider]  Magnesium 250 MG TABS Take 1  tablet by mouth daily.    [provider]  metFORMIN (GLUCOPHAGE-XR) 500 MG 24 hr tablet  04/25/19   [provider]  potassium citrate (UROCIT-K) 10 MEQ (1080 MG) SR tablet Take 10 mEq by mouth 3 (three) times daily with meals.     [provider]  pravastatin (PRAVACHOL) 20 MG tablet Take 20 mg by mouth daily.    [provider]  Tiotropium Bromide-Olodaterol (STIOLTO RESPIMAT) 2.5-2.5 MCG/ACT AERS Inhale 2 puffs into the lungs daily. 09/25/17   Juanito Doom, MD  triamcinolone (NASACORT) 55 MCG/ACT AERO nasal inhaler Place 1 spray into the nose daily.    [provider]    Allergies    Morphine and related, Tape, Amoxicillin, and Naproxen  Review of Systems   Review of Systems  All other systems reviewed and are negative.   Physical Exam Updated Vital Signs BP (!) 162/101 (BP Location: Right Arm)   Pulse (!) 125   Temp 97.8 F (36.6 C) (Oral)   Resp 16   Ht 5\' 10"  (1.778 m)   Wt 82.6 kg   SpO2 97%   BMI 26.11 kg/m   Physical Exam Vitals and nursing note reviewed.   75 year old male, resting comfortably and in no acute distress. Vital signs are significant for elevated blood pressure and heart rate. Oxygen saturation is 97%, which is normal. Head is normocephalic and atraumatic. PERRLA, EOMI. Oropharynx is clear. Neck is nontender and supple without adenopathy or JVD. Back is nontender and there is no CVA tenderness. Lungs are clear without rales, wheezes, or rhonchi. Chest is nontender. Heart has regular rate and rhythm without murmur. Abdomen is soft, flat, nontender.  Small umbilical hernia present which is easily reducible.  Bladder is distended almost to the umbilicus. Extremities have no cyanosis or edema, full range of motion is present. Skin is warm and dry without rash. Neurologic: Mental status is normal, cranial nerves are intact, there are no motor or sensory deficits.  ED Results / Procedures / Treatments     Labs (all labs ordered are listed, but only abnormal results are displayed) Labs Reviewed  URINALYSIS, ROUTINE W REFLEX MICROSCOPIC - Abnormal; Notable for the following components:      Result Value   Color, Urine STRAW (*)    Specific Gravity, Urine 1.004 (*)    Hgb urine dipstick MODERATE (*)    Leukocytes,Ua SMALL (*)    All other components within normal limits  URINE CULTURE   Procedures Procedures  Medications Ordered in ED  Medications - No data to display  ED Course  I have reviewed the triage vital signs and the nursing notes.  Pertinent la results that were available during my care of the patient were reviewed by me and considered in my medical decision making (see chart for details).  MDM Rules/Calculators/A&P Acute urinary retention.  Old records are reviewed, and he had been seen in the ED 10 days ago for obstruction of his Foley catheter.  Bladder scan showed over 800 mL of urine.  Foley catheter was placed and is draining clear urine.  He will be referred back to his urologist for follow-up.  Final Clinical Impression(s) / ED Diagnoses Final diagnoses:  Acute urinary retention  Elevated blood pressure reading with diagnosis of hypertension    Rx / DC Orders ED Discharge Orders    None       Delora Fuel, MD 47/58/30 [phone]

## 2019-08-30 NOTE — ED Notes (Signed)
Pt was discharged from the ED. Pt read and understood discharge paperwork. Pt had vital signs completed. Pt conscious, breathing, and A&Ox4. No distress noted. Pt speaking in complete sentences. Pt brought out of the ED via wheelchair.  

## 2019-08-30 NOTE — ED Notes (Signed)
Bladder scan showed 805 ml.

## 2019-08-30 NOTE — ED Triage Notes (Signed)
Pt reports he had the catheter removed today and was able to urinate today "a few times today" but has not been able to urinate in hours.  No bowel movement in 5-6 days

## 2019-08-31 ENCOUNTER — Other Ambulatory Visit: Payer: Self-pay | Admitting: Urology

## 2019-08-31 DIAGNOSIS — R972 Elevated prostate specific antigen [PSA]: Secondary | ICD-10-CM

## 2019-09-01 ENCOUNTER — Other Ambulatory Visit: Payer: Self-pay

## 2019-09-01 ENCOUNTER — Ambulatory Visit (INDEPENDENT_AMBULATORY_CARE_PROVIDER_SITE_OTHER)
Admission: RE | Admit: 2019-09-01 | Discharge: 2019-09-01 | Disposition: A | Discharge status: Home / Self Care | Payer: Medicare Other | Source: Ambulatory Visit | Attending: Emergency Medicine | Admitting: Emergency Medicine

## 2019-09-01 DIAGNOSIS — R911 Solitary pulmonary nodule: Secondary | ICD-10-CM | POA: Diagnosis not present

## 2019-09-02 ENCOUNTER — Telehealth: Payer: Self-pay | Admitting: Emergency Medicine

## 2019-09-02 NOTE — Telephone Encounter (Signed)
I was able to review the patient's CT scan of the chest and use the [employer] super D software to create a navigation plan.  I believe there is a good chance that we can navigate to the left upper lobe lesion.  For that reason we will plan to continue with the navigational bronchoscopy on 3/16 as planned.  I discussed this with him by phone today.  He understands plan.  Agrees.

## 2019-09-03 ENCOUNTER — Other Ambulatory Visit (HOSPITAL_COMMUNITY)
Admission: RE | Admit: 2019-09-03 | Discharge: 2019-09-03 | Disposition: A | Discharge status: Home / Self Care | Payer: Medicare Other | Source: Ambulatory Visit | Attending: Emergency Medicine | Admitting: Emergency Medicine

## 2019-09-03 DIAGNOSIS — Z20822 Contact with and (suspected) exposure to covid-19: Secondary | ICD-10-CM | POA: Diagnosis not present

## 2019-09-03 DIAGNOSIS — Z01812 Encounter for preprocedural laboratory examination: Secondary | ICD-10-CM | POA: Insufficient documentation

## 2019-09-03 LAB — SARS CORONAVIRUS 2 (TAT 6-24 HRS): SARS Coronavirus 2: NEGATIVE

## 2019-09-05 ENCOUNTER — Other Ambulatory Visit: Payer: Self-pay

## 2019-09-05 ENCOUNTER — Encounter (HOSPITAL_COMMUNITY): Payer: Self-pay | Admitting: Emergency Medicine

## 2019-09-05 DIAGNOSIS — R338 Other retention of urine: Secondary | ICD-10-CM | POA: Diagnosis not present

## 2019-09-05 NOTE — Progress Notes (Addendum)
Mr. Teuscher denies chest pain or shortness of breath.  Patient tested negative for Covid on 09/03/2019 and has been in quarantine with his wife since that time.  Mr. Cauble has type II diabetes, patient reports that last  A1C was 6.2. I instructed patient to not take Metformin in am. Patient does not check CBG's  Mr Richison's last EKG's patient was in, patient was in the ED with bladder retention. ( a foley catheter was inserted and is still in place.) I showed the EKG tracing, (tachycardia, rate 126) to Dr. Kalman Shan and he said he was sure patient was in severe pain he did not order a repeat EKG.

## 2019-09-06 ENCOUNTER — Other Ambulatory Visit: Payer: Self-pay

## 2019-09-06 ENCOUNTER — Ambulatory Visit (HOSPITAL_COMMUNITY): Payer: Medicare Other | Admitting: Certified Registered"

## 2019-09-06 ENCOUNTER — Encounter (HOSPITAL_COMMUNITY)
Admission: RE | Disposition: A | Discharge status: Home / Self Care | Payer: Self-pay | Source: Home / Self Care | Attending: Internal Medicine

## 2019-09-06 ENCOUNTER — Observation Stay (HOSPITAL_COMMUNITY)
Admission: RE | Admit: 2019-09-06 | Discharge: 2019-09-07 | Disposition: A | Discharge status: Home / Self Care | Payer: Medicare Other | Attending: Internal Medicine | Admitting: Internal Medicine

## 2019-09-06 ENCOUNTER — Ambulatory Visit (HOSPITAL_COMMUNITY): Payer: Medicare Other

## 2019-09-06 ENCOUNTER — Encounter (HOSPITAL_COMMUNITY): Payer: Self-pay | Admitting: Emergency Medicine

## 2019-09-06 ENCOUNTER — Inpatient Hospital Stay (HOSPITAL_COMMUNITY): Payer: Medicare Other

## 2019-09-06 ENCOUNTER — Telehealth: Payer: Self-pay | Admitting: Emergency Medicine

## 2019-09-06 DIAGNOSIS — N189 Chronic kidney disease, unspecified: Secondary | ICD-10-CM | POA: Insufficient documentation

## 2019-09-06 DIAGNOSIS — E785 Hyperlipidemia, unspecified: Secondary | ICD-10-CM | POA: Diagnosis not present

## 2019-09-06 DIAGNOSIS — E1151 Type 2 diabetes mellitus with diabetic peripheral angiopathy without gangrene: Secondary | ICD-10-CM | POA: Diagnosis not present

## 2019-09-06 DIAGNOSIS — Z79899 Other long term (current) drug therapy: Secondary | ICD-10-CM | POA: Insufficient documentation

## 2019-09-06 DIAGNOSIS — E1122 Type 2 diabetes mellitus with diabetic chronic kidney disease: Secondary | ICD-10-CM | POA: Insufficient documentation

## 2019-09-06 DIAGNOSIS — Z938 Other artificial opening status: Secondary | ICD-10-CM

## 2019-09-06 DIAGNOSIS — Z880 Allergy status to penicillin: Secondary | ICD-10-CM | POA: Diagnosis not present

## 2019-09-06 DIAGNOSIS — R911 Solitary pulmonary nodule: Secondary | ICD-10-CM | POA: Diagnosis not present

## 2019-09-06 DIAGNOSIS — I129 Hypertensive chronic kidney disease with stage 1 through stage 4 chronic kidney disease, or unspecified chronic kidney disease: Secondary | ICD-10-CM | POA: Insufficient documentation

## 2019-09-06 DIAGNOSIS — Z7984 Long term (current) use of oral hypoglycemic drugs: Secondary | ICD-10-CM | POA: Diagnosis not present

## 2019-09-06 DIAGNOSIS — R918 Other nonspecific abnormal finding of lung field: Secondary | ICD-10-CM

## 2019-09-06 DIAGNOSIS — N400 Enlarged prostate without lower urinary tract symptoms: Secondary | ICD-10-CM | POA: Insufficient documentation

## 2019-09-06 DIAGNOSIS — J939 Pneumothorax, unspecified: Secondary | ICD-10-CM | POA: Diagnosis not present

## 2019-09-06 DIAGNOSIS — N200 Calculus of kidney: Secondary | ICD-10-CM | POA: Insufficient documentation

## 2019-09-06 DIAGNOSIS — Z9689 Presence of other specified functional implants: Secondary | ICD-10-CM

## 2019-09-06 DIAGNOSIS — IMO0001 Reserved for inherently not codable concepts without codable children: Secondary | ICD-10-CM | POA: Diagnosis present

## 2019-09-06 DIAGNOSIS — J449 Chronic obstructive pulmonary disease, unspecified: Secondary | ICD-10-CM | POA: Diagnosis not present

## 2019-09-06 DIAGNOSIS — Z7982 Long term (current) use of aspirin: Secondary | ICD-10-CM | POA: Insufficient documentation

## 2019-09-06 DIAGNOSIS — C439 Malignant melanoma of skin, unspecified: Secondary | ICD-10-CM | POA: Insufficient documentation

## 2019-09-06 DIAGNOSIS — F17210 Nicotine dependence, cigarettes, uncomplicated: Secondary | ICD-10-CM | POA: Diagnosis not present

## 2019-09-06 DIAGNOSIS — Z885 Allergy status to narcotic agent status: Secondary | ICD-10-CM | POA: Insufficient documentation

## 2019-09-06 DIAGNOSIS — Z4682 Encounter for fitting and adjustment of non-vascular catheter: Secondary | ICD-10-CM | POA: Diagnosis not present

## 2019-09-06 DIAGNOSIS — Z9889 Other specified postprocedural states: Secondary | ICD-10-CM

## 2019-09-06 DIAGNOSIS — C3412 Malignant neoplasm of upper lobe, left bronchus or lung: Principal | ICD-10-CM | POA: Insufficient documentation

## 2019-09-06 DIAGNOSIS — J439 Emphysema, unspecified: Secondary | ICD-10-CM | POA: Diagnosis not present

## 2019-09-06 DIAGNOSIS — C771 Secondary and unspecified malignant neoplasm of intrathoracic lymph nodes: Secondary | ICD-10-CM | POA: Insufficient documentation

## 2019-09-06 DIAGNOSIS — N132 Hydronephrosis with renal and ureteral calculous obstruction: Secondary | ICD-10-CM | POA: Diagnosis not present

## 2019-09-06 DIAGNOSIS — J95811 Postprocedural pneumothorax: Secondary | ICD-10-CM

## 2019-09-06 DIAGNOSIS — Z886 Allergy status to analgesic agent status: Secondary | ICD-10-CM | POA: Diagnosis not present

## 2019-09-06 DIAGNOSIS — Z720 Tobacco use: Secondary | ICD-10-CM | POA: Insufficient documentation

## 2019-09-06 DIAGNOSIS — I10 Essential (primary) hypertension: Secondary | ICD-10-CM | POA: Insufficient documentation

## 2019-09-06 HISTORY — PX: BRONCHIAL BRUSHINGS: SHX5108

## 2019-09-06 HISTORY — DX: Chronic obstructive pulmonary disease, unspecified: J44.9

## 2019-09-06 HISTORY — PX: FINE NEEDLE ASPIRATION: SHX6590

## 2019-09-06 HISTORY — PX: LUNG BIOPSY: SHX5088

## 2019-09-06 HISTORY — PX: BRONCHIAL WASHINGS: SHX5105

## 2019-09-06 HISTORY — DX: Type 2 diabetes mellitus without complications: E11.9

## 2019-09-06 HISTORY — PX: VIDEO BRONCHOSCOPY WITH ENDOBRONCHIAL NAVIGATION: SHX6175

## 2019-09-06 LAB — COMPREHENSIVE METABOLIC PANEL
ALT: 17 U/L (ref 0–44)
AST: 20 U/L (ref 15–41)
Albumin: 3.9 g/dL (ref 3.5–5.0)
Alkaline Phosphatase: 85 U/L (ref 38–126)
Anion gap: 10 (ref 5–15)
BUN: 9 mg/dL (ref 8–23)
CO2: 27 mmol/L (ref 22–32)
Calcium: 10 mg/dL (ref 8.9–10.3)
Chloride: 98 mmol/L (ref 98–111)
Creatinine, Ser: 1.06 mg/dL (ref 0.61–1.24)
GFR calc Af Amer: >60 mL/min (ref >60)
GFR calc non Af Amer: >60 mL/min (ref >60)
Glucose, Bld: 93 mg/dL (ref 70–99)
Potassium: 3.9 mmol/L (ref 3.5–5.1)
Sodium: 135 mmol/L (ref 135–145)
Total Bilirubin: 1 mg/dL (ref 0.3–1.2)
Total Protein: 6.4 g/dL — ABNORMAL LOW (ref 6.5–8.1)

## 2019-09-06 LAB — CBC
HCT: 49.1 % (ref 39.0–52.0)
Hemoglobin: 16.3 g/dL (ref 13.0–17.0)
MCH: 30.1 pg (ref 26.0–34.0)
MCHC: 33.2 g/dL (ref 30.0–36.0)
MCV: 90.8 fL (ref 80.0–100.0)
Platelets: 202 10*3/uL (ref 150–400)
RBC: 5.41 MIL/uL (ref 4.22–5.81)
RDW: 14.6 % (ref 11.5–15.5)
WBC: 7.4 10*3/uL (ref 4.0–10.5)
nRBC: 0 % (ref 0.0–0.2)

## 2019-09-06 LAB — GLUCOSE, CAPILLARY
Glucose-Capillary: 99 mg/dL (ref 70–99)
Glucose-Capillary: 99 mg/dL (ref 70–99)
Glucose-Capillary: 130 mg/dL — ABNORMAL HIGH (ref 70–99)

## 2019-09-06 LAB — APTT: aPTT: 30 seconds (ref 24–36)

## 2019-09-06 LAB — PROTIME-INR
INR: 1.1 (ref 0.8–1.2)
Prothrombin Time: 13.8 seconds (ref 11.4–15.2)

## 2019-09-06 SURGERY — VIDEO BRONCHOSCOPY WITH ENDOBRONCHIAL NAVIGATION
Anesthesia: General

## 2019-09-06 MED ORDER — METFORMIN HCL 500 MG PO TABS: 1000.0000 mg | ORAL_TABLET | Freq: Every day | ORAL | Status: DC

## 2019-09-06 MED ORDER — FLUTICASONE PROPIONATE 50 MCG/ACT NA SUSP
2.0000 | Freq: Every day | NASAL | Status: DC
  Administered 2019-09-07: 2 via NASAL
  Filled 2019-09-06 (×2): qty 16

## 2019-09-06 MED ORDER — UMECLIDINIUM-VILANTEROL 62.5-25 MCG/INH IN AEPB
1.0000 | INHALATION_SPRAY | Freq: Every day | RESPIRATORY_TRACT | Status: DC
  Administered 2019-09-07: 1 via RESPIRATORY_TRACT
  Filled 2019-09-06: qty 14

## 2019-09-06 MED ORDER — MAGNESIUM OXIDE 400 (241.3 MG) MG PO TABS
200.0000 mg | ORAL_TABLET | Freq: Every day | ORAL | Status: DC
  Administered 2019-09-07: 200 mg via ORAL
  Filled 2019-09-06: qty 1

## 2019-09-06 MED ORDER — FENTANYL CITRATE (PF) 100 MCG/2ML IJ SOLN
50.0000 ug | Freq: Once | INTRAMUSCULAR | Status: AC
  Administered 2019-09-06: 50 ug via INTRAVENOUS

## 2019-09-06 MED ORDER — PROPOFOL 10 MG/ML IV BOLUS
INTRAVENOUS | Status: DC | PRN
  Administered 2019-09-06: 30 mg via INTRAVENOUS
  Administered 2019-09-06: 130 mg via INTRAVENOUS

## 2019-09-06 MED ORDER — ONDANSETRON HCL 4 MG/2ML IJ SOLN: 4.0000 mg | Freq: Four times a day (QID) | INTRAMUSCULAR | Status: DC | PRN

## 2019-09-06 MED ORDER — FINASTERIDE 5 MG PO TABS
5.0000 mg | ORAL_TABLET | Freq: Every day | ORAL | Status: DC
  Administered 2019-09-07: 5 mg via ORAL
  Filled 2019-09-06: qty 1

## 2019-09-06 MED ORDER — ALBUTEROL SULFATE (2.5 MG/3ML) 0.083% IN NEBU: 2.5000 mg | INHALATION_SOLUTION | Freq: Four times a day (QID) | RESPIRATORY_TRACT | Status: DC | PRN

## 2019-09-06 MED ORDER — TAMSULOSIN HCL 0.4 MG PO CAPS
0.4000 mg | ORAL_CAPSULE | Freq: Every day | ORAL | Status: DC
  Administered 2019-09-06: 0.4 mg via ORAL
  Filled 2019-09-06: qty 1

## 2019-09-06 MED ORDER — ACETAMINOPHEN 325 MG PO TABS: 650.0000 mg | ORAL_TABLET | ORAL | Status: DC | PRN

## 2019-09-06 MED ORDER — PHENYLEPHRINE 40 MCG/ML (10ML) SYRINGE FOR IV PUSH (FOR BLOOD PRESSURE SUPPORT)
PREFILLED_SYRINGE | INTRAVENOUS | Status: DC | PRN
  Administered 2019-09-06 (×2): 120 ug via INTRAVENOUS
  Administered 2019-09-06: 40 ug via INTRAVENOUS
  Administered 2019-09-06: 120 ug via INTRAVENOUS

## 2019-09-06 MED ORDER — DEXAMETHASONE SODIUM PHOSPHATE 10 MG/ML IJ SOLN
INTRAMUSCULAR | Status: DC | PRN
  Administered 2019-09-06: 10 mg via INTRAVENOUS

## 2019-09-06 MED ORDER — MAGNESIUM 250 MG PO TABS: 1.0000 | ORAL_TABLET | Freq: Every day | ORAL | Status: DC

## 2019-09-06 MED ORDER — ONDANSETRON HCL 4 MG/2ML IJ SOLN
INTRAMUSCULAR | Status: DC | PRN
  Administered 2019-09-06: 4 mg via INTRAVENOUS

## 2019-09-06 MED ORDER — AMLODIPINE BESYLATE 10 MG PO TABS
10.0000 mg | ORAL_TABLET | Freq: Every day | ORAL | Status: DC
  Administered 2019-09-07: 10 mg via ORAL
  Filled 2019-09-06: qty 1

## 2019-09-06 MED ORDER — EZETIMIBE 10 MG PO TABS
5.0000 mg | ORAL_TABLET | Freq: Every day | ORAL | Status: DC
  Administered 2019-09-06 – 2019-09-07 (×2): 5 mg via ORAL
  Filled 2019-09-06 (×2): qty 1

## 2019-09-06 MED ORDER — ROCURONIUM BROMIDE 50 MG/5ML IV SOSY
PREFILLED_SYRINGE | INTRAVENOUS | Status: DC | PRN
  Administered 2019-09-06: 50 mg via INTRAVENOUS

## 2019-09-06 MED ORDER — VITAMIN D 25 MCG (1000 UNIT) PO TABS
1000.0000 [IU] | ORAL_TABLET | Freq: Every day | ORAL | Status: DC
  Administered 2019-09-06: 1000 [IU] via ORAL
  Filled 2019-09-06 (×2): qty 1

## 2019-09-06 MED ORDER — POTASSIUM CITRATE ER 10 MEQ (1080 MG) PO TBCR
10.0000 meq | EXTENDED_RELEASE_TABLET | Freq: Three times a day (TID) | ORAL | Status: DC
  Administered 2019-09-07 (×2): 10 meq via ORAL
  Filled 2019-09-06 (×3): qty 1

## 2019-09-06 MED ORDER — FENTANYL CITRATE (PF) 250 MCG/5ML IJ SOLN
INTRAMUSCULAR | Status: DC | PRN
  Administered 2019-09-06: 100 ug via INTRAVENOUS

## 2019-09-06 MED ORDER — METFORMIN HCL 500 MG PO TABS: 500.0000 mg | ORAL_TABLET | Freq: Every day | ORAL | Status: DC

## 2019-09-06 MED ORDER — MIDAZOLAM HCL 2 MG/2ML IJ SOLN
1.0000 mg | Freq: Once | INTRAMUSCULAR | Status: AC
  Administered 2019-09-06: 1 mg via INTRAVENOUS

## 2019-09-06 MED ORDER — ATORVASTATIN CALCIUM 40 MG PO TABS
40.0000 mg | ORAL_TABLET | Freq: Every day | ORAL | Status: DC
  Administered 2019-09-07: 40 mg via ORAL
  Filled 2019-09-06: qty 1

## 2019-09-06 MED ORDER — FENTANYL CITRATE (PF) 100 MCG/2ML IJ SOLN
25.0000 ug | INTRAMUSCULAR | Status: DC | PRN
  Administered 2019-09-06 (×2): 25 ug via INTRAVENOUS
  Administered 2019-09-07: 50 ug via INTRAVENOUS
  Filled 2019-09-06 (×3): qty 2

## 2019-09-06 MED ORDER — INDAPAMIDE 1.25 MG PO TABS
1.2500 mg | ORAL_TABLET | Freq: Every day | ORAL | Status: DC
  Filled 2019-09-06 (×3): qty 1

## 2019-09-06 MED ORDER — ALBUTEROL SULFATE HFA 108 (90 BASE) MCG/ACT IN AERS
INHALATION_SPRAY | RESPIRATORY_TRACT | Status: DC | PRN
  Administered 2019-09-06: 2 via RESPIRATORY_TRACT

## 2019-09-06 MED ORDER — ALBUTEROL SULFATE HFA 108 (90 BASE) MCG/ACT IN AERS: 2.0000 | INHALATION_SPRAY | Freq: Four times a day (QID) | RESPIRATORY_TRACT | Status: DC | PRN

## 2019-09-06 MED ORDER — OXYCODONE-ACETAMINOPHEN 5-325 MG PO TABS
1.0000 | ORAL_TABLET | ORAL | Status: DC | PRN
  Administered 2019-09-06: 1 via ORAL
  Filled 2019-09-06: qty 1

## 2019-09-06 MED ORDER — SUGAMMADEX SODIUM 200 MG/2ML IV SOLN
INTRAVENOUS | Status: DC | PRN
  Administered 2019-09-06: 160 mg via INTRAVENOUS

## 2019-09-06 MED ORDER — EPHEDRINE SULFATE-NACL 50-0.9 MG/10ML-% IV SOSY
PREFILLED_SYRINGE | INTRAVENOUS | Status: DC | PRN
  Administered 2019-09-06 (×2): 10 mg via INTRAVENOUS

## 2019-09-06 MED ORDER — ASPIRIN EC 81 MG PO TBEC
81.0000 mg | DELAYED_RELEASE_TABLET | Freq: Every day | ORAL | Status: DC
  Filled 2019-09-06: qty 1

## 2019-09-06 MED ORDER — PHENYLEPHRINE HCL-NACL 10-0.9 MG/250ML-% IV SOLN
INTRAVENOUS | Status: DC | PRN
  Administered 2019-09-06: 25 ug/min via INTRAVENOUS

## 2019-09-06 MED ORDER — METFORMIN HCL ER 500 MG PO TB24: 500.0000 mg | ORAL_TABLET | ORAL | Status: DC

## 2019-09-06 MED ORDER — FENTANYL CITRATE (PF) 100 MCG/2ML IJ SOLN
INTRAMUSCULAR | Status: AC
  Filled 2019-09-06: qty 2

## 2019-09-06 MED ORDER — MIDAZOLAM HCL 5 MG/5ML IJ SOLN
INTRAMUSCULAR | Status: DC | PRN
  Administered 2019-09-06: 1 mg via INTRAVENOUS

## 2019-09-06 MED ORDER — LACTATED RINGERS IV SOLN: INTRAVENOUS | Status: DC | PRN

## 2019-09-06 MED ORDER — LIDOCAINE 2% (20 MG/ML) 5 ML SYRINGE
INTRAMUSCULAR | Status: DC | PRN
  Administered 2019-09-06: 100 mg via INTRAVENOUS

## 2019-09-06 MED ORDER — MIDAZOLAM HCL 2 MG/2ML IJ SOLN
INTRAMUSCULAR | Status: AC
  Filled 2019-09-06: qty 2

## 2019-09-06 NOTE — Telephone Encounter (Signed)
Addressed.

## 2019-09-06 NOTE — Progress Notes (Signed)
Ready to move to p hase 2 , pending repeat cxr post procedure.

## 2019-09-06 NOTE — Anesthesia Preprocedure Evaluation (Addendum)
Anesthesia Evaluation  Patient identified by MRN, date of birth, ID band Patient awake    Reviewed: Allergy & Precautions, NPO status , Patient's Chart, lab work & pertinent test results  History of Anesthesia Complications Negative for: history of anesthetic complications  Airway Mallampati: II  TM Distance: >3 FB Neck ROM: Full    Dental  (+) Edentulous Upper, Edentulous Lower   Pulmonary COPD, Current Smoker,  Lung mass   Pulmonary exam normal        Cardiovascular hypertension, + Peripheral Vascular Disease  Normal cardiovascular exam     Neuro/Psych negative neurological ROS  negative psych ROS   GI/Hepatic negative GI ROS, Neg liver ROS,   Endo/Other  diabetes, Type 2  Renal/GU Renal InsufficiencyRenal disease  negative genitourinary   Musculoskeletal negative musculoskeletal ROS (+)   Abdominal   Peds  Hematology negative hematology ROS (+)   Anesthesia Other Findings Stress test 2016 - no ischemia  Reproductive/Obstetrics                           Anesthesia Physical Anesthesia Plan  ASA: III  Anesthesia Plan: General   Post-op Pain Management:    Induction: Intravenous  PONV Risk Score and Plan: Ondansetron, Dexamethasone, Treatment may vary due to age or medical condition and Midazolam  Airway Management Planned: Oral ETT  Additional Equipment: None  Intra-op Plan:   Post-operative Plan: Extubation in OR  Informed Consent: I have reviewed the patients History and Physical, chart, labs and discussed the procedure including the risks, benefits and alternatives for the proposed anesthesia with the patient or authorized representative who has indicated his/her understanding and acceptance.     Dental advisory given  Plan Discussed with:   Anesthesia Plan Comments:        Anesthesia Quick Evaluation

## 2019-09-06 NOTE — Transfer of Care (Signed)
Immediate Anesthesia Transfer of Care Note  Patient: Chad Gomez  Procedure(s) Performed: VIDEO BRONCHOSCOPY WITH ENDOBRONCHIAL NAVIGATION (N/A ) BRONCHIAL BRUSHINGS FINE NEEDLE ASPIRATION LUNG BIOPSY  Patient Location: PACU  Anesthesia Type:General  Level of Consciousness: awake, alert  and oriented  Airway & Oxygen Therapy: Patient Spontanous Breathing and Patient connected to face mask oxygen  Post-op Assessment: Report given to RN and Post -op Vital signs reviewed and stable  Post vital signs: Reviewed and stable  Last Vitals:  Vitals Value Taken Time  BP 145/80 09/06/19 1130  Temp 36.5 C 09/06/19 1130  Pulse 113 09/06/19 1137  Resp 20 09/06/19 1137  SpO2 93 % 09/06/19 1137  Vitals shown include unvalidated device data.  Last Pain:  Vitals:   09/06/19 0815  TempSrc:   PainSc: 0-No pain         Complications: No apparent anesthesia complications

## 2019-09-06 NOTE — Op Note (Signed)
Video Bronchoscopy with Electromagnetic Navigation Procedure Note  Date of Operation: 09/06/2019  Pre-op Diagnosis: Left upper lobe pulmonary nodule  Post-op Diagnosis: Same  Surgeon: Baltazar Apo  Assistants: None  Anesthesia: General endotracheal anesthesia  Operation: Flexible video fiberoptic bronchoscopy with electromagnetic navigation and biopsies.  Estimated Blood Loss: Minimal  Complications: None apparent  Indications and History: Chad Gomez is a 74 y.o. male with history of tobacco use.  He was found to have a left upper lobe pulmonary nodule on lung cancer screening CT scan.  Subsequent PET scan confirmed hypermetabolism.  Recommendation was made to achieve a tissue diagnosis via navigational bronchoscopy and biopsies.  The risks, benefits, complications, treatment options and expected outcomes were discussed with the patient.  The possibilities of pneumothorax, pneumonia, reaction to medication, pulmonary aspiration, perforation of a viscus, bleeding, failure to diagnose a condition and creating a complication requiring transfusion or operation were discussed with the patient who freely signed the consent.    Description of Procedure: The patient was seen in the Preoperative Area, was examined and was deemed appropriate to proceed.  The patient was taken to [facility] endoscopy room 2, identified as [name] and the procedure verified as Flexible Video Fiberoptic Bronchoscopy.  A Time Out was held and the above information confirmed.   Prior to the date of the procedure a high-resolution CT scan of the chest was performed. Utilizing [place] a virtual tracheobronchial tree was generated to allow the creation of distinct navigation pathways to the patient's parenchymal abnormalities. After being taken to the operating room general anesthesia was initiated and the patient  was orally intubated. The video fiberoptic bronchoscope was introduced via the  endotracheal tube and a general inspection was performed which showed normal airways throughout.  There were no endobronchial lesions or abnormal secretions. The extendable working channel and locator guide were introduced into the bronchoscope. The distinct navigation pathway prepared prior to this procedure were then utilized to navigate to within 1.8cm of patient's lesion identified on CT scan. The extendable working channel was secured into place and the locator guide was withdrawn. Under fluoroscopic guidance transbronchial needle brushings, transbronchial Wang needle biopsies, and transbronchial forceps biopsies were performed to be sent for cytology and pathology. A bronchioalveolar lavage was performed in the left upper lobe and sent for cytology. At the end of the procedure a general airway inspection was performed and there was no evidence of active bleeding. The bronchoscope was removed.  The patient tolerated the procedure well. There was no significant blood loss and there were no obvious complications. A post-procedural chest x-ray was performed in the procedure room that did not show any obvious pneumothorax.  A final read from radiology is pending.  This will be reviewed before he is discharged home.  Samples: 1. Transbronchial needle brushings from left upper lobe nodule 2. Transbronchial Wang needle biopsies from left upper lobe nodule 3. Transbronchial forceps biopsies from left upper lobe nodule 4. Bronchoalveolar lavage from left upper lobe  Plans:  The patient will be discharged from the PACU to home when recovered from anesthesia and after chest x-ray is reviewed. We will review the cytology, pathology and microbiology results with the patient when they become available. Outpatient followup will be with Dr Lamonte Sakai.    Baltazar Apo, MD, PhD 09/06/2019, 11:22 AM [facility] [phone] or if no answer [phone]

## 2019-09-06 NOTE — Progress Notes (Signed)
Wife notified of pt's arrival to the floor/2W01 and given telephone number to room

## 2019-09-06 NOTE — Progress Notes (Signed)
CCM MD and NP at bedside for left side chest tube. Time out complete and ordered medications given. RN at bedside monitoring.

## 2019-09-06 NOTE — Progress Notes (Signed)
Dr Lamonte Sakai in procedure in Endo/ message left re: pt's 40% pnx per radiology, anticipate pigtail cath placement. New #20 sl place l forearm x 1 attempt and polaced on ccm.

## 2019-09-06 NOTE — Interval H&P Note (Signed)
History and Physical Interval Note:  09/06/2019 7:42 AM  Chad Gomez  has presented today for surgery, with the diagnosis of LEFT UPPER LOPE NODULE.  The various methods of treatment have been discussed with the patient. After consideration of risks, benefits and other options for treatment, the patient has consented to  Procedure(s): [place] (N/A) as a surgical intervention.  The patient's history has been reviewed, patient examined, no change in status, stable for surgery.  I have reviewed the patient's chart and labs.  Questions were answered to the patient's satisfaction.     Baltazar Apo, MD, PhD 09/06/2019, 7:42 AM  [facility] [phone] or if no answer [phone]

## 2019-09-06 NOTE — Anesthesia Postprocedure Evaluation (Signed)
Anesthesia Post Note  Patient: Chad Gomez  Procedure(s) Performed: VIDEO BRONCHOSCOPY WITH ENDOBRONCHIAL NAVIGATION (N/A ) BRONCHIAL BRUSHINGS FINE NEEDLE ASPIRATION LUNG BIOPSY BRONCHIAL WASHINGS     Patient location during evaluation: PACU Anesthesia Type: General Level of consciousness: awake and alert Pain management: pain level controlled Vital Signs Assessment: post-procedure vital signs reviewed and stable Respiratory status: spontaneous breathing, nonlabored ventilation, respiratory function stable and patient connected to nasal cannula oxygen Cardiovascular status: blood pressure returned to baseline and stable Postop Assessment: no apparent nausea or vomiting Anesthetic complications: no Comments:  Pt complained to Dr. Lamonte Sakai about mild chest discomfort after procedure- CXR was ordered and showed PNX. Chest tube was placed in PACU. Pt is breathing comfortably now without dyspnea. To be admitted.    Last Vitals:  Vitals:   09/06/19 1430 09/06/19 1502  BP: 130/81 130/76  Pulse: 91   Resp: 18   Temp:    SpO2: 94% 97%    Last Pain:  Vitals:   09/06/19 1502  TempSrc:   PainSc: Asleep                 Lidia Collum

## 2019-09-06 NOTE — Progress Notes (Deleted)
Moved to phase 2 pending 2nd CXR results .

## 2019-09-06 NOTE — Anesthesia Procedure Notes (Signed)
Procedure Name: Intubation Date/Time: 09/06/2019 9:41 AM Performed by: Griffin Dakin, CRNA Pre-anesthesia Checklist: Patient identified, Emergency Drugs available, Suction available and Patient being monitored Patient Re-evaluated:Patient Re-evaluated prior to induction Oxygen Delivery Method: Circle system utilized Preoxygenation: Pre-oxygenation with 100% oxygen Induction Type: IV induction Ventilation: Mask ventilation without difficulty and Oral airway inserted - appropriate to patient size Laryngoscope Size: Mac and 4 Grade View: Grade I Tube type: Oral Tube size: 8.5 mm Number of attempts: 1 Airway Equipment and Method: Stylet and Oral airway Placement Confirmation: ETT inserted through vocal cords under direct vision,  positive ETCO2 and breath sounds checked- equal and bilateral Secured at: 22 cm Tube secured with: Tape Dental Injury: Teeth and Oropharynx as per pre-operative assessment

## 2019-09-06 NOTE — Procedures (Addendum)
Chest Tube Insertion Procedure Note  Indications:  Clinically significant Pneumothorax  Pre-operative Diagnosis: Pneumothorax  Post-operative Diagnosis: Pneumothorax  Procedure Details  Informed consent was obtained for the procedure, including sedation.  Risks of lung perforation, hemorrhage, arrhythmia, and adverse drug reaction were discussed.   After sterile skin prep, using standard technique, a 14 French tube was placed in the left anterior 3nd rib space / area overlying lung.  Findings: Rush of air.   Estimated Blood Loss:  Minimal         Specimens:  None              Complications:  None; patient tolerated the procedure well.         Disposition: PACU - hemodynamically stable.         Condition: stable.  CXR post placement with resolution of pneumothorax and appropriate placement of chest tube.    Procedure performed under direct supervision of Dr. Tamala Julian and with ultrasound guidance.    Chad Gens, MSN, NP-C [facility] 09/06/2019, 2:55 PM   Please see [email or web address] for pager details.

## 2019-09-06 NOTE — Progress Notes (Addendum)
[place] P[facility]   Brief Patient History:  74 y/o M, smoker with severe obstructive lung disease, who recently underwent low dose CT screening for lung cancer which revealed a LUL nodule.  PET scan was completed which was hypermetabolic but no distant disease.  He underwent FOB with ENB for tissue sampling 3/16.  Post procedure noted to have ~ 40% pneumothorax requiring chest tube placement.    S: Called by Dr. Lamonte Sakai regarding follow up CXR post procedure with concern for pneumothorax   O: Blood pressure 130/76, pulse 91, temperature 97.8 F (36.6 C), resp. rate 18, height 5\' 10"  (1.778 m), weight 82.6 kg, SpO2 97 %.  General: elderly male lying in bed in NAD HEENT: MM pink/moist, anicteric, wearing glasses / face mask Neuro: AAOx4, speech clear, MAE CV: SR on monitor  PULM: non-labored on [place] O2, diminished on left GI: soft, bsx4 active  Extremities: warm/dry, no edema  Skin: no rashes or lesions  A: Right Pneumothorax  Left Lung Mass s/p Biopsy  COPD HTN HLD DM  CKD  BPH  P: Admit for observation  Diet as tolerated  Place chest tube now  Chest tube to water seal  Flush chest tube with 30 ml Q8 hours Oxycodone 1 tablet Q4 PRN pain Follow up labs in am > CBC, BMP Follow up CXR in am  Continue home medications  Await pathology   See Dr. Agustina Caroli H&P for full details of admission.    Noe Gens, MSN, NP-C  [facility] 09/06/2019, 3:06 PM   Please see [email or web address] for pager details.

## 2019-09-06 NOTE — Telephone Encounter (Signed)
Received call report from CXR 1 view obtained today through [facility] radiology.  Impression copied below, full report available in Epic:  IMPRESSION: 1.  Moderate left pneumothorax, new compared to prior examination.  2.  Emphysema.  RB please advise.  Thanks.

## 2019-09-06 NOTE — Progress Notes (Signed)
Pt. Going home, having left chest soreness while MD was rounding. MD ordered STAT XRay to double check that patient was ok to go home. When XRay is taken will send to phase 2 and patient can go home when [facility] cleared by radiology per Dr. Lamonte Sakai. VSS.

## 2019-09-06 NOTE — Progress Notes (Signed)
Procedure completed. Patient A&O x4 with no complaints. VSS (see flow sheets).

## 2019-09-07 ENCOUNTER — Inpatient Hospital Stay (HOSPITAL_COMMUNITY): Payer: Medicare Other

## 2019-09-07 DIAGNOSIS — R911 Solitary pulmonary nodule: Secondary | ICD-10-CM | POA: Diagnosis not present

## 2019-09-07 DIAGNOSIS — J449 Chronic obstructive pulmonary disease, unspecified: Secondary | ICD-10-CM | POA: Diagnosis not present

## 2019-09-07 DIAGNOSIS — I129 Hypertensive chronic kidney disease with stage 1 through stage 4 chronic kidney disease, or unspecified chronic kidney disease: Secondary | ICD-10-CM | POA: Diagnosis not present

## 2019-09-07 DIAGNOSIS — Z885 Allergy status to narcotic agent status: Secondary | ICD-10-CM | POA: Diagnosis not present

## 2019-09-07 DIAGNOSIS — Z4682 Encounter for fitting and adjustment of non-vascular catheter: Secondary | ICD-10-CM | POA: Diagnosis not present

## 2019-09-07 DIAGNOSIS — Z7982 Long term (current) use of aspirin: Secondary | ICD-10-CM | POA: Diagnosis not present

## 2019-09-07 DIAGNOSIS — Z7984 Long term (current) use of oral hypoglycemic drugs: Secondary | ICD-10-CM | POA: Diagnosis not present

## 2019-09-07 DIAGNOSIS — E1122 Type 2 diabetes mellitus with diabetic chronic kidney disease: Secondary | ICD-10-CM | POA: Diagnosis not present

## 2019-09-07 DIAGNOSIS — C3412 Malignant neoplasm of upper lobe, left bronchus or lung: Secondary | ICD-10-CM | POA: Diagnosis not present

## 2019-09-07 DIAGNOSIS — N189 Chronic kidney disease, unspecified: Secondary | ICD-10-CM | POA: Diagnosis not present

## 2019-09-07 DIAGNOSIS — C771 Secondary and unspecified malignant neoplasm of intrathoracic lymph nodes: Secondary | ICD-10-CM | POA: Diagnosis not present

## 2019-09-07 DIAGNOSIS — Z886 Allergy status to analgesic agent status: Secondary | ICD-10-CM | POA: Diagnosis not present

## 2019-09-07 DIAGNOSIS — J939 Pneumothorax, unspecified: Secondary | ICD-10-CM | POA: Diagnosis not present

## 2019-09-07 DIAGNOSIS — Z79899 Other long term (current) drug therapy: Secondary | ICD-10-CM | POA: Diagnosis not present

## 2019-09-07 DIAGNOSIS — J95811 Postprocedural pneumothorax: Secondary | ICD-10-CM | POA: Diagnosis not present

## 2019-09-07 DIAGNOSIS — J439 Emphysema, unspecified: Secondary | ICD-10-CM | POA: Diagnosis not present

## 2019-09-07 DIAGNOSIS — E785 Hyperlipidemia, unspecified: Secondary | ICD-10-CM | POA: Diagnosis not present

## 2019-09-07 DIAGNOSIS — E1151 Type 2 diabetes mellitus with diabetic peripheral angiopathy without gangrene: Secondary | ICD-10-CM | POA: Diagnosis not present

## 2019-09-07 DIAGNOSIS — Z880 Allergy status to penicillin: Secondary | ICD-10-CM | POA: Diagnosis not present

## 2019-09-07 LAB — CBC
HCT: 45.2 % (ref 39.0–52.0)
Hemoglobin: 15.4 g/dL (ref 13.0–17.0)
MCH: 30.2 pg (ref 26.0–34.0)
MCHC: 34.1 g/dL (ref 30.0–36.0)
MCV: 88.6 fL (ref 80.0–100.0)
Platelets: 198 10*3/uL (ref 150–400)
RBC: 5.1 MIL/uL (ref 4.22–5.81)
RDW: 14.1 % (ref 11.5–15.5)
WBC: 11.7 10*3/uL — ABNORMAL HIGH (ref 4.0–10.5)
nRBC: 0 % (ref 0.0–0.2)

## 2019-09-07 LAB — BASIC METABOLIC PANEL
Anion gap: 10 (ref 5–15)
BUN: 12 mg/dL (ref 8–23)
CO2: 25 mmol/L (ref 22–32)
Calcium: 9.7 mg/dL (ref 8.9–10.3)
Chloride: 98 mmol/L (ref 98–111)
Creatinine, Ser: 1.06 mg/dL (ref 0.61–1.24)
GFR calc Af Amer: >60 mL/min (ref >60)
GFR calc non Af Amer: >60 mL/min (ref >60)
Glucose, Bld: 159 mg/dL — ABNORMAL HIGH (ref 70–99)
Potassium: 4.2 mmol/L (ref 3.5–5.1)
Sodium: 133 mmol/L — ABNORMAL LOW (ref 135–145)

## 2019-09-07 LAB — MAGNESIUM: Magnesium: 1.6 mg/dL — ABNORMAL LOW (ref 1.7–2.4)

## 2019-09-07 MED ORDER — TAMSULOSIN HCL 0.4 MG PO CAPS
0.4000 mg | ORAL_CAPSULE | Freq: Two times a day (BID) | ORAL | Status: DC
  Administered 2019-09-07: 0.4 mg via ORAL
  Filled 2019-09-07: qty 1

## 2019-09-07 MED ORDER — CHLORHEXIDINE GLUCONATE CLOTH 2 % EX PADS: 6.0000 | MEDICATED_PAD | Freq: Every day | CUTANEOUS | Status: DC

## 2019-09-07 NOTE — Discharge Instructions (Signed)
Take tylenol for pain. Return to ER if severe chest pain or dyspnea develops. Follow up with Dr. Lamonte Sakai at date listed.

## 2019-09-07 NOTE — Telephone Encounter (Signed)
Noted by triage, thank you. Patient is currently admitted as of 3.16.21 Will sign off

## 2019-09-07 NOTE — Care Management CC44 (Signed)
Condition Code 44 Documentation Completed  Patient Details  Name: Chad Gomez MRN: [id number] Date of Birth: 09-17-1945   Condition Code 44 given:  Yes Patient signature on Condition Code 44 notice:  Yes Documentation of 2 MD's agreement:  Yes Code 44 added to claim:  Yes    Atilano Median, LCSW 09/07/2019, 3:55 PM

## 2019-09-07 NOTE — Discharge Summary (Signed)
Date of Admission: 09/06/19 Date of Death: 24-Sep-2019 Diagnoses: Post-bronch pneumothorax Hospital Course: Patent was admitted after developing clinically significant pneumothorax after bronchoscopy with biopsy.  He did well with re-expansion of lung after tube thoracostomy. Pneumothorax stayed resolved after chest tube clamping trial so it was removed and patient was sent home with follow-up with Dr. Lamonte Sakai.  No major changes were made to medications.  Allergies as of 09-24-2019      Reactions   Morphine And Related Anxiety, Other (See Comments)   Combative, incoherent     Tape Other (See Comments), Rash   Surgical tape Adhesive tape causes blisters   Amoxicillin Other (See Comments)   Severe stomach pain   Naproxen Swelling   Eyes swell      Medication List    TAKE these medications   albuterol 108 (90 Base) MCG/ACT inhaler Commonly known as: VENTOLIN HFA Inhale 2 puffs into the lungs every 6 (six) hours as needed for wheezing or shortness of breath.   amLODipine 10 MG tablet Commonly known as: NORVASC Take 10 mg by mouth daily.   aspirin EC 81 MG tablet Take 81 mg by mouth daily.   atorvastatin 40 MG tablet Commonly known as: LIPITOR Take 40 mg by mouth daily.   ezetimibe 10 MG tablet Commonly known as: ZETIA Take 5 mg by mouth daily.   finasteride 5 MG tablet Commonly known as: PROSCAR Take 5 mg by mouth daily.   fluticasone 50 MCG/ACT nasal spray Commonly known as: FLONASE Place 2 sprays into both nostrils daily.   indapamide 1.25 MG tablet Commonly known as: LOZOL Take 1.25 mg by mouth daily.   Magnesium 250 MG Tabs Take 1 tablet by mouth daily.   metFORMIN 500 MG 24 hr tablet Commonly known as: GLUCOPHAGE-XR Take 500-1,000 mg by mouth See admin instructions. Take 1 tablet every morning and take 2 tablets at supper   potassium citrate 10 MEQ (1080 MG) SR tablet Commonly known as: UROCIT-K Take 10 mEq by mouth 3 (three) times daily with meals.    tamsulosin 0.4 MG Caps capsule Commonly known as: FLOMAX Take 0.4 mg by mouth in the morning and at bedtime.   Tiotropium Bromide-Olodaterol 2.5-2.5 MCG/ACT Aers Commonly known as: Stiolto Respimat Inhale 2 puffs into the lungs daily.   Vitamin D-3 25 MCG (1000 UT) Caps Take 1,000 Units by mouth daily.        Erskine Emery MD PCCM

## 2019-09-07 NOTE — Progress Notes (Signed)
[place] [facility]  09/07/19   Brief Patient History:  74 y/o M, smoker with severe obstructive lung disease, who recently underwent low dose CT screening for lung cancer which revealed a LUL nodule.  PET scan was completed which was hypermetabolic but no distant disease.  He underwent FOB with ENB for tissue sampling 3/16.  Post procedure noted to have ~ 40% pneumothorax requiring chest tube placement.    S: No events, chest pain with inspiration. Lungs are up on CXR.  O: Blood pressure 130/76, pulse 91, temperature 97.8 F (36.6 C), resp. rate 18, height 5\' 10"  (1.778 m), weight 82.6 kg, SpO2 97 %.  General: elderly male lying in bed in NAD HEENT: MM pink/moist, anicteric Neuro: AAOx4, speech clear, MAE CV: RRR, ext warm PULM: diminished bilaterally, no wheezing, no air leak on goal in chest tube on waterseal GI: soft, bsx4 active  Extremities: warm/dry, no edema  Skin: no rashes or lesions  A: Right Pneumothorax  Left Lung Mass s/p Biopsy  COPD HTN HLD DM  CKD  BPH  P: Chest tube clamped CXR 1PM, remove if lung remains up DC later today if chest tube removed. Continue home medications  Await pathology   Erskine Emery MD PCCM

## 2019-09-07 NOTE — Plan of Care (Signed)

## 2019-09-07 NOTE — Progress Notes (Signed)
Pt requested all 1000 morning medications be given with his AM potassium citrate.

## 2019-09-07 NOTE — Progress Notes (Signed)
Pt is ready for discharge.

## 2019-09-09 DIAGNOSIS — I10 Essential (primary) hypertension: Secondary | ICD-10-CM | POA: Diagnosis not present

## 2019-09-09 LAB — SURGICAL PATHOLOGY

## 2019-09-09 LAB — CYTOLOGY - NON PAP

## 2019-09-12 ENCOUNTER — Telehealth: Payer: Self-pay | Admitting: Emergency Medicine

## 2019-09-12 DIAGNOSIS — E1165 Type 2 diabetes mellitus with hyperglycemia: Secondary | ICD-10-CM | POA: Diagnosis not present

## 2019-09-12 DIAGNOSIS — I739 Peripheral vascular disease, unspecified: Secondary | ICD-10-CM | POA: Diagnosis not present

## 2019-09-12 DIAGNOSIS — Z0001 Encounter for general adult medical examination with abnormal findings: Secondary | ICD-10-CM | POA: Diagnosis not present

## 2019-09-12 DIAGNOSIS — C3492 Malignant neoplasm of unspecified part of left bronchus or lung: Secondary | ICD-10-CM

## 2019-09-12 NOTE — Telephone Encounter (Signed)
Patient's pathology and cytology are available, both consistent with non-small cell lung cancer, likely adenocarcinoma in the left upper lobe.  I attempted to call him to discuss but he was not available.  I left him a message that I would try him back.  We will continue to reach out to him.  He may be a surgical candidate and I can discuss the options with him

## 2019-09-12 NOTE — Telephone Encounter (Signed)
Called the patient again - no answer, left message.

## 2019-09-12 NOTE — Telephone Encounter (Signed)
Patient is returning Dr. Lamonte Sakai phone call. Patient phone number is [phone].

## 2019-09-13 NOTE — Telephone Encounter (Signed)
Patient is returning Dr Lamonte Sakai phone call. Patient phone number is [phone].

## 2019-09-14 NOTE — Telephone Encounter (Signed)
I was able to reach the patient and explained his tissue diagnosis.  He understands.  I explained to him that he may be a surgical candidate and that I would like for him to be seen at [facility] to be considered for all possible treatment modalities.  He is in agreement.  I will make the referral today.

## 2019-09-15 ENCOUNTER — Other Ambulatory Visit: Payer: Self-pay | Admitting: *Deleted

## 2019-09-15 NOTE — Progress Notes (Signed)
The proposed treatment discussed in cancer conference 09/15/19 is for discussion purpose only and not a binding recommendation.  The patient was not physically examined nor present for their treatment options.  Therefore, final treatment plans cannot be decided.

## 2019-09-21 ENCOUNTER — Institutional Professional Consult (permissible substitution): Payer: Medicare Other | Admitting: Cardiothoracic Surgery

## 2019-09-21 ENCOUNTER — Other Ambulatory Visit: Payer: Self-pay | Admitting: *Deleted

## 2019-09-21 ENCOUNTER — Other Ambulatory Visit: Payer: Self-pay

## 2019-09-21 VITALS — BP 124/79 | HR 89 | Temp 97.9°F | Resp 20 | Ht 70.0 in | Wt 182.0 lb

## 2019-09-21 DIAGNOSIS — E041 Nontoxic single thyroid nodule: Secondary | ICD-10-CM

## 2019-09-21 DIAGNOSIS — C3412 Malignant neoplasm of upper lobe, left bronchus or lung: Secondary | ICD-10-CM

## 2019-09-21 NOTE — Progress Notes (Signed)
[address]       [place] [address]             [phone]                    Chad Gomez [facility] Medical Record #[id number] Date of Birth: 27-May-1946  Referring: Collene Gobble, MD Primary Care: [name], MD Primary Cardiologist: Minus Breeding, MD  Chief Complaint:    Chief Complaint  Patient presents with  . Lung Cancer    LUL nodule, ENB w/ biopsy 09/06/19, PET 08/16/19    History of Present Illness:    Chad Gomez 74 y.o. male is seen in the office  today for surgical evaluation of of new left upper lobe lung lesion, on recent navigation bronchoscopy confirmed to be non-small cell carcinoma of the lung.  Patient notes that he has been followed for several years in the pulmonary clinic for his underlying severe COPD.  He had been getting lung cancer screening CTs on a yearly basis for several years .  Because of the Covid pandemic he did not have a lung cancer screening yearly CT in 2020.  A follow-up CT scan was done February day that showed a new multiloculated 2 cm left upper lobe lesion.  This was confirmed to be hypermetabolic on PET scan without nodal involvement.  He underwent navigation bronchoscopy which demonstrated non-small cell carcinoma of the lung.  The patient was referred to thoracic surgery for consideration of resection with the known limitation of his underlying severe COPD.  He was noted that he developed a 40% pneumothorax following navigation bronchoscopy and biopsy treated with tube thoracostomy.  The patient has been a long-term smoker over 50 years at x2 packs a day for recently 1 pack a day.  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  Patient has known peripheral vascular disease having had aortobifem for abdominal aortic aneurysm and iliac disease right greater than left.  He has no specific history of myocardial  infarction or stent placements, he is followed by Dr. Percival Spanish for cardiology, with most recently seen for an abnormal EKG. nuclear stress test was done prior to his abdominal aortic surgery.  Patient has a history of hypertension hyperlipidemia type 2 diabetes denies previous stroke. He has had problems with bladder outlet obstruction and is currently wears a Foley full-time.  Patient notes that he does not plan to stop smoking     Path: Clinical History: left upper lobe nodule (cm)  FINAL MICROSCOPIC DIAGNOSIS:  A. LUNG, LEFT UPPER LOBE, BIOPSY:  - Non-small cell carcinoma, see comment.  COMMENT:  There are scant malignant cells which are likely insufficient for  additional studies. Dr. Jeannie Done has reviewed the case.   CYTOLOGY - NON PAP  CASE: [id number]  PATIENT: Chad Gomez  Non-Gynecological Cytology Report   Clinical History: None provided   FINAL MICROSCOPIC DIAGNOSIS:   A. LUNG, LUL, BRUSHING:  - Malignant cells consistent with non-small cell carcinoma   B. LYMPH NODE, LUL, FINE NEEDLE ASPIRATION:  - Malignant cells consistent with non-small cell carcinoma   COMMENT:   B. There are only scant malignant cells which are positive for TTF-1 and  NapsinA. They are negative for cytokeratin 5/6 and p40. The  immunoprofile is consistent with a lung adenocarcinoma. Dr. Jeannie Done has  reviewed the case.  SPECIMEN ADEQUACY:   A. Satisfactory for Evaluation  B. Satisfactory for Evaluation   GROSS:   Received is/are:(A)(1)2 slides (1 quick stain).(2)2 Slides (1 quick  stain).(3)2 Slides (1 quick stain).(B)(1)2 Slides (1 quick stain). Also  received are 5cc's of peach saline solution from needle  rinses.(TS:KJ:kj)  Smears:(A)6(B)2  Concentration Method (ThinPrep):(B)1  Cell Block:(B)1 Conventional  Additional Studies: Bronchial washing see [id number]   Current Activity/ Functional Status:  Patient is independent with mobility/ambulation, transfers, ADL's,  IADL's.   Zubrod Score: At the time of surgery this patient's most appropriate activity status/level should be described as: []     0    Normal activity, no symptoms [x]     1    Restricted in physical strenuous activity but ambulatory, able to do out light work []     2    Ambulatory and capable of self care, unable to do work activities, up and about               >50 % of waking hours                              []     3    Only limited self care, in bed greater than 50% of waking hours []     4    Completely disabled, no self care, confined to bed or chair []     5    Moribund   Past Medical History:  Diagnosis Date  . BPH (benign prostatic hypertrophy)    weak stream  . Chronic kidney disease   . Complication of anesthesia    urinary retention  . COPD (chronic obstructive pulmonary disease) ([place])   . Cough productive of clear sputum    ongoing, ENT, PCP aware  . Diabetes mellitus without complication ([place])    Type II  . History of bladder stone   . History of colon polyps    08/2013   pre-canerous  . History of kidney stones   . History of melanoma excision    LEFT EAR  . Hyperlipidemia   . Hypertension   . Iron deficiency   . Open wound    left lower jaw; followed by ENT  . Peripheral vascular disease ([place])   . Right ureteral stone   . Shingles 05-15-2015  . Wears dentures    upper/lower  . Wears glasses     Past Surgical History:  Procedure Laterality Date  . ABDOMINAL AORTAGRAM  11/01/2014   Procedure: Abdominal Aortagram;  Surgeon: Serafina Mitchell, MD;  Location: [facility];  Service: Cardiovascular;;  . AORTA - BILATERAL FEMORAL ARTERY BYPASS GRAFT N/A 12/28/2014   Procedure: AORTOBIFEMORAL BYPASS GRAFT;  Surgeon: Serafina Mitchell, MD;  Location: [facility];  Service: Vascular;  Laterality: N/A;  . BRONCHIAL BRUSHINGS  09/06/2019   Procedure: BRONCHIAL BRUSHINGS;  Surgeon: Collene Gobble, MD;  Location: [facility] END[facility]COPY;  Service: Pulmonary;;  left upper lobe nodule   . BRONCHIAL WASHINGS  09/06/2019   Procedure: BRONCHIAL WASHINGS;  Surgeon: Collene Gobble, MD;  Location: [facility] ENDOSCOPY;  Service: Pulmonary;;  . COLONOSCOPY W/ POLYPECTOMY  09-13-2013  . CYSTOLITHALOPAXY OF BLADDER STONE/ TRANSRECTAL ULTRASOUND PROSTATE BX  08-19-2005  . CYSTOSCOPY W/ RETROGRADES Right 12/02/2012   Procedure: CYSTOSCOPY WITH RETROGRADE PYELOGRAM;  Surgeon: Malka So, MD;  Location: [facility];  Service: Urology;  Laterality: Right;  . CYSTOSCOPY W/ RETROGRADES Bilateral 10/13/2013   Procedure:  CYSTOSCOPY WITH RETROGRADE PYELOGRAM;  Surgeon: Irine Seal, MD;  Location: [facility];  Service: Urology;  Laterality: Bilateral;  . CYSTOSCOPY W/ URETERAL STENT PLACEMENT Left 04/11/2014   Procedure: CYSTOSCOPY WITH STENT REPLACEMENT;  Surgeon: Malka So, MD;  Location: [facility];  Service: Urology;  Laterality: Left;  . CYSTOSCOPY WITH LITHOLAPAXY Right 12/02/2012   Procedure: CYSTOSCOPY WITH stone extraction;  Surgeon: Malka So, MD;  Location: [facility];  Service: Urology;  Laterality: Right;  . CYSTOSCOPY WITH LITHOLAPAXY Right 10/13/2013   Procedure: CYSTOSCOPY WITH LITHOLAPAXY, removal of urethral stone with basket;  Surgeon: Irine Seal, MD;  Location: [facility];  Service: Urology;  Laterality: Right;  . CYSTOSCOPY WITH RETROGRADE PYELOGRAM, URETEROSCOPY AND STENT PLACEMENT Left 03/21/2014   Procedure: CYSTOSCOPY WITH RETROGRADE PYELOGRAM, AND LEFT STENT PLACEMENT;  Surgeon: Alexis Frock, MD;  Location: [facility];  Service: Urology;  Laterality: Left;  . CYSTOSCOPY WITH RETROGRADE PYELOGRAM, URETEROSCOPY AND STENT PLACEMENT  09/09/2016   Procedure: CYSTOSCOPY WITH RIGHT  RETROGRADE PYELOGRAM, URETEROSCOPY WITH LASER BASKET EXTRACTION OF STONES  AND STENT PLACEMENT;  Surgeon: Irine Seal, MD;  Location: [facility];  Service: Urology;;  . Consuela Mimes WITH URETEROSCOPY Left 04/11/2014    Procedure: CYSTOSCOPY WITH URETEROSCOPY/ STONE EXTRACTION;  Surgeon: Malka So, MD;  Location: [facility];  Service: Urology;  Laterality: Left;  . FINE NEEDLE ASPIRATION  09/06/2019   Procedure: FINE NEEDLE ASPIRATION;  Surgeon: Collene Gobble, MD;  Location: [facility] ENDOSCOPY;  Service: Pulmonary;;  left upper lobe - lung  . HOLMIUM LASER APPLICATION Right 12/16/6387   Procedure: HOLMIUM LASER APPLICATION;  Surgeon: Malka So, MD;  Location: [facility];  Service: Urology;  Laterality: Right;  . HOLMIUM LASER APPLICATION Left 37/34/2876   Procedure: HOLMIUM LASER APPLICATION;  Surgeon: Malka So, MD;  Location: [facility];  Service: Urology;  Laterality: Left;  . HOLMIUM LASER APPLICATION Right 01/31/5725   Procedure: HOLMIUM LASER APPLICATION;  Surgeon: Irine Seal, MD;  Location: [facility];  Service: Urology;  Laterality: Right;  . INGUINAL HERNIA REPAIR Right 1954  . LITHOTRIPSY  2007 & 02/2013  . LUNG BIOPSY  09/06/2019   Procedure: LUNG BIOPSY;  Surgeon: Collene Gobble, MD;  Location: [facility] ENDOSCOPY;  Service: Pulmonary;;  left upper lob lung  . [place]  . MICROLARYNGOSCOPY Right 02/18/2016   Procedure: MICRO DIRECT LARYNGOSCOPY EXCISIONAL BIOPSY OF RIGHT VOCAL CORD MASS;  Surgeon: Leta Baptist, MD;  Location: [facility];  Service: ENT;  Laterality: Right;  . [place]   left ear  . MULTIPLE TOOTH EXTRACTIONS  1994  . NEPHROLITHOTOMY Left 01/27/2013   Procedure: NEPHROLITHOTOMY PERCUTANEOUS FIRST LOOK;  Surgeon: Malka So, MD;  Location: [facility];  Service: Urology;  Laterality: Left;  . PERIPHERAL VASCULAR CATHETERIZATION N/A 11/01/2014   Procedure: Lower Extremity Angiography;  Surgeon: Serafina Mitchell, MD;  Location: [facility];  Service: Cardiovascular;  Laterality: N/A;  . URETEROSCOPY Right 12/02/2012   Procedure: RIGHT URETEROSCOPY STONE EXTRACTION WITH  STENT PLACEMENT;   Surgeon: Malka So, MD;  Location: [facility];  Service: Urology;  Laterality: Right;  Marland Kitchen VIDEO BRONCHOSCOPY WITH ENDOBRONCHIAL NAVIGATION N/A 09/06/2019   Procedure: VIDEO BRONCHOSCOPY WITH ENDOBRONCHIAL NAVIGATION;  Surgeon: Collene Gobble, MD;  Location: [place]OSCOPY;  Service: Pulmonary;  Laterality: N/A;    Family History  Problem Relation Age of Onset  . Dementia Mother   . Hypertension Mother   . Stroke Mother 69  . Dementia Father   . Hypertension Sister   . Colon cancer Neg Hx      Social History   Tobacco Use  Smoking Status Current Every Day Smoker  . Packs/day: 1.00  . Years: 59.00  . Pack years: 59.00  . Types: Cigarettes  . Start date: 06/24/1959  Smokeless Tobacco Never Used    Social History   Substance and Sexual Activity  Alcohol Use No  . Alcohol/week: 0.0 standard drinks     Allergies  Allergen Reactions  . Morphine And Related Anxiety and Other (See Comments)    Combative, incoherent    . Tape Other (See Comments) and Rash    Surgical tape Adhesive tape causes blisters  . Amoxicillin Other (See Comments)    Severe stomach pain  . Naproxen Swelling    Eyes swell    Current Outpatient Medications  Medication Sig Dispense Refill  . albuterol (PROVENTIL HFA;VENTOLIN HFA) 108 (90 Base) MCG/ACT inhaler Inhale 2 puffs into the lungs every 6 (six) hours as needed for wheezing or shortness of breath. 1 Inhaler 6  . amLODipine (NORVASC) 10 MG tablet Take 10 mg by mouth daily.    Marland Kitchen aspirin EC 81 MG tablet Take 81 mg by mouth daily.    Marland Kitchen atorvastatin (LIPITOR) 40 MG tablet Take 40 mg by mouth daily.    . Cholecalciferol (VITAMIN D-3) 1000 units CAPS Take 1,000 Units by mouth daily.     Marland Kitchen ezetimibe (ZETIA) 10 MG tablet Take 5 mg by mouth daily.    . finasteride (PROSCAR) 5 MG tablet Take 5 mg by mouth daily.    . fluticasone (FLONASE) 50 MCG/ACT nasal spray Place 2 sprays into both nostrils daily.     . indapamide (LOZOL) 1.25 MG  tablet Take 1.25 mg by mouth daily.    . Magnesium 250 MG TABS Take 1 tablet by mouth daily.    . metFORMIN (GLUCOPHAGE-XR) 500 MG 24 hr tablet Take 500-1,000 mg by mouth See admin instructions. Take 1 tablet every morning and take 2 tablets at supper    . potassium citrate (UROCIT-K) 10 MEQ (1080 MG) SR tablet Take 10 mEq by mouth 3 (three) times daily with meals.     . tamsulosin (FLOMAX) 0.4 MG CAPS capsule Take 0.4 mg by mouth in the morning and at bedtime.    . Tiotropium Bromide-Olodaterol (STIOLTO RESPIMAT) 2.5-2.5 MCG/ACT AERS Inhale 2 puffs into the lungs daily. 1 Inhaler 11   No current facility-administered medications for this visit.    Pertinent items are noted in HPI.   Review of Systems:     Cardiac Review of Systems: [Y] = yes  or   [ N ] = no   Chest Pain Florencio.Farrier    ]  Resting SOB [ n  ] Exertional SOB  Blue.Reese  ]  Orthopnea Florencio.Farrier  ]   Pedal Edema [  n ]    Palpitations Florencio.Farrier  ] Syncope  [ n ]   Presyncope [  n ]   General Review of Systems: [Y] = yes [  ]=no Constitional: recent weight change [  ];  Wt loss over the last 3 months [   ] anorexia [  ]; fatigue [  ]; nausea [  ]; night sweats [  ]; fever [  ]; or chills [  ];  Eye : blurred vision [  ]; diplopia [   ]; vision changes [  ];  Amaurosis fugax[  ]; Resp: cough Blue.Reese  ];  wheezing[ y ];  hemoptysis[  ]; shortness of breath[y  ]; paroxysmal nocturnal dyspnea[  ]; dyspnea on exertion[ y ]; or orthopnea[  ];  GI:  gallstones[  ], vomiting[  ];  dysphagia[  ]; melena[  ];  hematochezia [  ]; heartburn[  ];   Hx of  Colonoscopy[  ]; GU: kidney stones [  ]; hematuria[  ];   dysuria [  ];  nocturia[  ];  history of     obstruction [  ]; urinary frequency [  ]             Skin: rash, swelling[  ];, hair loss[  ];  peripheral edema[  ];  or itching[  ]; Musculosketetal: myalgias[  ];  joint swelling[  ];  joint erythema[  ];  joint pain[  ];  back pain[  ];  Heme/Lymph: bruising[  ];  bleeding[  ];  anemia[  ];  Neuro: TIA[  ];   headaches[  ];  stroke[  ];  vertigo[  ];  seizures[  ];   paresthesias[y  ];  difficulty walking[  ];  Psych:depression[  ]; anxiety[  ];  Endocrine: diabetes[y  ];  thyroid dysfunction[  ];  Immunizations: Flu up to date [  ]; Pneumococcal up to date [  ];  Other: Indwelling Foley    PHYSICAL EXAMINATION: BP 124/79 (BP Location: Left Arm, Patient Position: Sitting, Cuff Size: Normal)   Pulse 89   Temp 97.9 F (36.6 C) (Temporal)   Resp 20   Ht 5\' 10"  (1.778 m)   Wt 182 lb (82.6 kg)   SpO2 96% Comment: RA  BMI 26.11 kg/m  General appearance: alert, cooperative and no distress Head: Normocephalic, without obvious abnormality, atraumatic Neck: no adenopathy, no carotid bruit, no JVD, supple, symmetrical, trachea midline and thyroid not enlarged, symmetric, no tenderness/mass/nodules Lymph nodes: Cervical, supraclavicular, and axillary nodes normal. Resp: clear to auscultation bilaterally Cardio: regular rate and rhythm, S1, S2 normal, no murmur, click, rub or gallop GI: soft, non-tender; bowel sounds normal; no masses,  no organomegaly and Large incisional ventral hernia Extremities: extremities normal, atraumatic, no cyanosis or edema and Palpable DP and PT pulses bilaterally left greater than the right Neurologic: Grossly normal  sensory: No evidence of peripheral neuropathy  Diagnostic Studies & Laboratory data:     Recent Radiology Findings:    CLINICAL DATA:  Initial treatment strategy for bilobed medial left upper lobe lesion on recent screening chest CT study.  EXAM: NUCLEAR MEDICINE PET SKULL BASE TO THIGH  TECHNIQUE: 9.2 mCi F-18 FDG was injected intravenously. Full-ring PET imaging was performed from the skull base to thigh after the radiotracer. CT data was obtained and used for attenuation correction and anatomic localization.  Fasting blood glucose: 100 mg/dl  COMPARISON:  08/01/2019 screening chest CT.  FINDINGS: Mediastinal blood pool activity: SUV  max 2.6  Liver activity: SUV max NA  NECK: No enlarged or hypermetabolic lymph nodes in the neck.  Mildly hypermetabolic 1.0 cm anterior left thyroid nodule with max SUV 3.8 (series 4/image 47).  Incidental CT findings: Small mucous retention cysts versus polyps in the dependent maxillary sinuses. No fluid levels.  CHEST:  Intensely hypermetabolic irregular 2.8 x 2.5 cm solid pulmonary nodule in anteromedial left upper lobe (series 8/image 41) with max SUV 13.7. No  additional hypermetabolic pulmonary findings.  No enlarged or hypermetabolic hilar, mediastinal or axillary lymph nodes.  Mild hypermetabolism (max SUV 3.6) associated with superficial subcutaneous 1.0 cm cystic lesion in the posterior right shoulder (series 4/image 36).  Incidental CT findings: Coronary atherosclerosis. Atherosclerotic nonaneurysmal thoracic aorta. Severe centrilobular emphysema. No additional significant pulmonary nodules.  ABDOMEN/PELVIS: No abnormal hypermetabolic activity within the liver, pancreas, adrenal glands, or spleen. No hypermetabolic lymph nodes in the abdomen or pelvis.  Incidental CT findings: Small hiatal hernia. Atherosclerotic abdominal aorta status post aortobifemoral surgical graft placed. Asymmetric severe atrophy of the right kidney. Fat containing 1.2 cm posteroinferior right liver lesion, compatible with liver angiomyolipoma. Cholelithiasis. Marked prostatomegaly with mass-effect on the bladder base by the enlarged nodular median lobe of the prostate. Foley catheter in place within the nondistended bladder. Large supraumbilical ventral abdominal hernia containing multiple small bowel loops without acute bowel complication.  SKELETON: No focal hypermetabolic activity to suggest skeletal metastasis.  Incidental CT findings: none  IMPRESSION: 1.Intensely hypermetabolic (max SUV 24.5) irregular 2.8 x 2.5 cm solid pulmonary nodule in the anteromedial left  upper lobe, primary bronchogenic carcinoma.  On navigation bronchoscopy  2. No hypermetabolic thoracic adenopathy or distant metastatic disease. PET-CT/clinical stage IA (T1c N0 M0).  3. Mildly hypermetabolic 1.0 cm anterior left thyroid nodule. Plan thyroid US and biopsy   4.  Chronic findings include: Aortic Atherosclerosis (ICD10-I70.0) and Emphysema (ICD10-J43.9). Coronary atherosclerosis. Small hiatal hernia. Cholelithiasis. Marked prostatomegaly.   Electronically Signed   By: Ilona Sorrel M.D.   On: 08/16/2019 14:48  CT Super D Chest Wo Contrast  Result Date: 09/01/2019 CLINICAL DATA:  Left upper lobe pulmonary nodule. EXAM: CT CHEST WITHOUT CONTRAST TECHNIQUE: Multidetector CT imaging of the chest was performed using thin slice collimation for electromagnetic bronchoscopy planning purposes, without intravenous contrast. COMPARISON:  Multiple exams, including 08/01/2019 FINDINGS: Cardiovascular: Coronary, aortic arch, and branch vessel atherosclerotic vascular disease. Mediastinum/Nodes: Small type 1 hiatal hernia. Lungs/Pleura: Bilobed left upper lobe pulmonary nodule anteriorly, with upper portion measuring 2.6 by 1.9 cm (formerly 2.8 by 1.8 cm) and lower portion measuring 2.3 by 1.7 cm (formerly 2.5 by 1.6 cm). This lesion was previously hypermetabolic. Centrilobular emphysema. Mild bilateral airway thickening. Mucus noted in the left mainstem bronchus. Scarring in the lingula noted. Upper Abdomen: Cholelithiasis. Hypodense lesion posteriorly in the right hepatic lobe likely has fatty elements. Suggesting angiomyolipoma. Musculoskeletal: Mild thoracic spondylosis. IMPRESSION: 1. Similar size of the bilobed left upper lobe pulmonary nodule. 2. Other imaging findings of potential clinical significance: Coronary, aortic arch, and branch vessel atherosclerotic vascular disease. Small type 1 hiatal hernia. Cholelithiasis. Probable angiomyolipoma posteriorly in the right hepatic lobe. Mild  bilateral airway thickening. Aortic Atherosclerosis (ICD10-I70.0). Emphysema (ICD10-J43.9). Electronically Signed   By: Van Clines M.D.   On: 09/01/2019 13:18     I have independently reviewed the above radiology studies  and reviewed the findings with the patient.   Recent Lab Findings: Lab Results  Component Value Date   WBC 11.7 (H) 09/07/2019   HGB 15.4 09/07/2019   HCT 45.2 09/07/2019   PLT 198 09/07/2019   GLUCOSE 159 (H) 09/07/2019   ALT 17 09/06/2019   AST 20 09/06/2019   NA 133 (L) 09/07/2019   K 4.2 09/07/2019   CL 98 09/07/2019   CREATININE 1.06 09/07/2019   BUN 12 09/07/2019   CO2 25 09/07/2019   INR 1.1 09/06/2019   PFT's  08/2017    fev1 1.03   31%  07/2019  FEV1  1.46      47%                    With bronchodilators  1.83     59%    + 24%               DLCO  15.96  64%   Assessment / Plan:   1.Intensely hypermetabolic (max SUV 99.2) irregular 2.8 x 2.5 cm solid pulmonary nodule in the anteromedial left upper lobe, primary bronchogenic carcinoma on navigation bronchoscopy-  discussed with the patient the surgical options versus stereotactic radiotherapy.  With the location being just anterior to the heart a limited resection-robotically was recommended to the patient is most efficacious treatment.  Stereotactic radiotherapy as a secondary option but with the fields being close to the heart stereotactic radiotherapy may not be ideal.-We will plan to proceed after preop clearance by Dr. Percival Spanish  2. No hypermetabolic thoracic adenopathy or distant metastatic disease. PET-CT/clinical stage IA (T1c N0 M0).  3. Mildly hypermetabolic 1.0 cm anterior left thyroid nodule. Plan thyroid US and biopsy   4. Cholelithiasis.   5. Marked prostatomegaly with Chronic indwelling Foley for urinary bladder obstruction  6. Known peripheral vascular disease status post aortobi fem  7. Long-term tobacco use with severe underlying COPD  8. Diabetes with complications of  vascular disease and peripheral neuropathy    I  spent 60 minutes with  the patient and his wife face to face and in reviewing CT scans and PET scans care   [name] MD      [address] [place], [address] Office [phone]     09/21/2019 3:21 PM

## 2019-09-21 NOTE — Patient Instructions (Signed)
Lung Cancer Lung cancer is an abnormal growth of cancerous cells that forms a mass (malignant tumor) in a lung. There are several types of lung cancer. The types are based on the appearance of the tumor cells. The two most common types are:  Non-small cell lung cancer. This type of lung cancer is the most common type. Non-small cell lung cancers include squamous cell carcinoma, adenocarcinoma, and large cell carcinoma.  Small cell lung cancer. In this type of lung cancer, abnormal cells are smaller than those of non-small cell lung cancer. Small cell lung cancer gets worse (progresses) faster than non-small cell lung cancer. What are the causes? The most common cause of lung cancer is smoking tobacco. The second most common cause is exposure to a chemical called radon. What increases the risk? You are more likely to develop this condition if:  You smoke tobacco.  You have been exposed to: ? Secondhand tobacco smoke. ? Radon gas. ? Uranium. ? Asbestos. ? Arsenic in drinking water. ? Air pollution.  You have a family or personal history of lung cancer.  You have had lung radiation therapy in the past.  You are older than age 1. What are the signs or symptoms? In the early stages, you may not have any symptoms. As the cancer progresses, symptoms may include:  A lasting cough, possibly with blood.  Fatigue.  Unexplained weight loss.  Shortness of breath.  Loud breathing (wheezing).  Chest pain.  Loss of appetite. Symptoms of advanced lung cancer include:  Hoarseness.  Bone or joint pain.  Weakness.  Change in the structure of the fingernails (clubbing), so that the nail looks like an upside-down spoon.  Swelling of the face or arms.  Inability to move the face (paralysis).  Drooping eyelids. How is this diagnosed? This condition may be diagnosed based on:  Your symptoms and medical history.  A physical exam.  A chest X-ray.  A CT scan.  Blood  tests.  Sputum tests.  Removal of a sample of lung tissue (lung biopsy) for testing. Your cancer will be assessed (staged) to determine how severe it is and how much it has spread (metastasized). How is this treated? Treatment depends on the type and stage of your cancer. Treatment may include one or more of the following:  Surgery to remove as much of the cancer as possible. Lymph nodes in the area may be removed and tested for cancer as well.  Medicines that kill cancer cells (chemotherapy).  High-energy rays that kill cancer cells (radiation therapy).  Chemotherapy. This treatment uses medicines to destroy cancer cells.  Targeted therapy. This targets specific parts of cancer cells and the area around them to block the growth and spread of the cancer. Targeted therapy can help limit the damage to healthy cells. Follow these instructions at home: Eating and drinking  Some of your treatments might affect your appetite. If you are having problems eating, or if you do not have an appetite, meet with a dietitian.  If you have side effects that affect your appetite, it may help to: ? Eat smaller meals and snacks often. ? Drink high-nutrition and high-calorie shakes or supplements. ? Eat bland and soft foods that are easy to eat. ? Avoid eating foods that are hot, spicy, or hard to swallow. General instructions   Do not use any products that contain nicotine or tobacco, such as cigarettes and e-cigarettes. If you need help quitting, ask your health care provider.  Do not drink alcohol.  If you are admitted to the hospital, make sure your cancer specialist (oncologist) is aware. Your cancer may affect your treatment for other conditions.  Take over-the-counter and prescription medicines only as told by your health care provider.  Consider joining a support group for people who have been diagnosed with lung cancer.  Work with your health care provider to manage any side effects of  treatment.  Keep all follow-up visits as told by your health care provider. This is important. Where to find more information  American Cancer Society: https://www.cancer.[place] https://www.cancer.gov Contact a health care provider if you:  Lose weight without trying.  Have a persistent cough and wheezing.  Feel short of breath.  Get tired easily.  Have bone or joint pain.  Have difficulty swallowing.  Notice that your voice is changing or getting hoarse.  Have pain that does not get better with medicine. Get help right away if you:  Cough up blood.  Have new breathing problems.  Have chest pain.  Have a fever.  Have swelling in an ankle, leg, or arm, or the face or neck.  Have paralysis in your face.  Are very confused.  Have a drooping eyelid. Summary  Lung cancer is an abnormal growth of cancerous cells that forms a mass (malignant tumor) in a lung.  There are several types of lung cancer. The types are based on the appearance of the tumor cells. The two most common types are non-small cell and small cell.  The most common cause of lung cancer is smoking tobacco.  Early symptoms include a lasting cough, possibly with blood, and fatigue, unexplained weight loss, and shortness of breath.  After diagnosis, treatment depends on the type and stage of your cancer. This information is not intended to replace advice given to you by your health care provider. Make sure you discuss any questions you have with your health care provider. Document Revised: 05/22/2017 Document Reviewed: 04/16/2017 Elsevier Patient Education  2020 [facility] Video-assisted thoracic surgery (VATS) is a procedure that allows your surgeon to look inside your chest and perform minor procedures as needed. The thoracic area is between the neck and abdomen. VATS is commonly done to:  Study or diagnose problems in the  chest.  Remove a tissue sample (biopsy) to be examined.  Put medicines directly into the chest.  Remove collections of fluid, pus, or blood.  Remove tumors.  Remove a part of a lung (lobectomy).  Treat some problems with the spine, including: ? Abnormal curves (scoliosis or kyphosis). ? Breaks (fractures). ? Tumors. VATS is done using thoracoscopy. Thoracoscopy is a procedure in which a thin tube with a light and camera on the end (thoracoscope) is inserted through a small incision in the chest wall. The thoracoscope sends images to a video monitor that your surgeon will use to view the inside of your chest. Tell a health care provider about:  Any allergies you have.  All medicines you are taking, including vitamins, herbs, eye drops, creams, and over-the-counter medicines.  Any problems you or family members have had with anesthetic medicines.  Any surgeries you have had.  Any medical conditions you have.  Any blood disorders you have.  Whether you are pregnant or may be pregnant. What are the risks? Generally, this is a safe procedure. However, problems may occur, including:  Infection.  Severe bleeding (hemorrhage).  Allergic reaction to medicines.  Damage to structures or organs in the  chest, such as nerves.  Lung infection (pneumonia).  Inability to complete the procedure. If this is the case, your chest may need to be opened with a large incision (thoracotomy). What happens before the procedure? Medicines  Ask your health care provider about: ? Changing or stopping your regular medicines. This is especially important if you are taking diabetes medicines or blood thinners. ? Taking medicines such as aspirin and ibuprofen. These medicines can thin your blood. Do not take these medicines before your procedure if your health care provider instructs you not to.  You may be given antibiotic medicine to help prevent infection. Staying hydrated Follow instructions  from your health care provider about hydration, which may include:  Up to 2 hours before the procedure - you may continue to drink clear liquids, such as water, clear fruit juice, black coffee, and plain tea. Eating and drinking restrictions Follow instructions from your health care provider about eating and drinking, which may include:  8 hours before the procedure - stop eating heavy meals or foods such as meat, fried foods, or fatty foods.  6 hours before the procedure - stop eating light meals or foods, such as toast or cereal.  6 hours before the procedure - stop drinking milk or drinks that contain milk.  2 hours before the procedure - stop drinking clear liquids. General instructions  Ask your health care provider how your surgical site will be marked or identified.  You may be asked to shower with a germ-killing soap.  You may have a blood or urine sample taken.  You may have imaging tests, such as: ? Chest X-ray. ? Electrocardiogram (ECG). ? Ultrasound. ? CT scan.  Do not use any products that contain nicotine or tobacco for as long as possible before your procedure. These include cigarettes and e-cigarettes. If you need help quitting, ask your health care provider.  Plan to have someone take you home from the hospital or clinic.  If you will be going home right after the procedure, plan to have someone with you for 24 hours. What happens during the procedure?  To lower your risk of infection: ? Your health care team will wash or sanitize their hands. ? Your skin will be washed with soap.  An IV tube will be inserted into one of your veins.  You will be given one or more of the following: ? A medicine to make you fall asleep (general anesthetic). ? A medicine to help you relax (sedative). ? A medicine that is injected into an area of your body to numb everything below the injection site (regional anesthetic). This is less common for this procedure. It may be given  if you are not able to tolerate general anesthetic.  A thin tube (catheter) will be inserted into your bladder and your tube that carries urine out of your body (urethra). The catheter will drain your urine.  1-4 small incisions will be made in your chest. The number of incisions depends on the purpose of your procedure.  The thoracoscope will be inserted into your incision(s) and used to view the inside of your chest.  One of your lungs will be deflated. This makes it easier for your surgeon to see the area.  Other surgical instruments may be inserted through your incision(s) to perform necessary procedures.  After any procedures are done, your lung will be inflated.  A chest tube may be inserted through an incision to drain excess fluid from the surgical area.  Any  remaining incisions will be closed with stitches (sutures) or staples.  A bandage (dressing) may be placed over your incision(s). The procedure may vary among health care providers and hospitals. What happens after the procedure?  Your blood pressure, heart rate, breathing rate, and blood oxygen level will be monitored until the medicines you were given have worn off.  You may have a chest tube draining fluid from the surgical area. The chest tube will be closely monitored for signs of fluid or air buildup in your lungs.  You may continue to receive fluids and medicines through an IV tube.  You may have to wear compression stockings. These stockings help to prevent blood clots and reduce swelling in your legs.  Do not drive for 24 hours if you were given a sedative. Summary  Video-assisted thoracic surgery (VATS) is a procedure that allows your surgeon to look inside your chest to study or diagnose problems in your thoracic area.  VATS is done by inserting a thin tube with a light and camera on the end (thoracoscope) through a small incision in the chest wall.  After the procedure, you may have a chest tube draining  fluid from the surgical area. The chest tube will be closely monitored for signs of fluid or air buildup in your lungs. This information is not intended to replace advice given to you by your health care provider. Make sure you discuss any questions you have with your health care provider. Document Revised: 05/22/2017 Document Reviewed: 05/26/2016 Elsevier Patient Education  2020 [facility], Care After This sheet gives you information about how to care for yourself after your procedure. Your health care provider may also give you more specific instructions. If you have problems or questions, contact your health care provider. What can I expect after the procedure? After the procedure, it is common to have:  Some pain and soreness in your chest.  Pain when breathing in (inhaling) and coughing.  Constipation.  Fatigue.  Difficulty sleeping. Follow these instructions at home: Preventing pneumonia  Take deep breaths or do breathing exercises as instructed by your health care provider. Doing this helps prevent lung infection (pneumonia).  Cough frequently. Coughing may cause discomfort, but it is important to clear mucus (phlegm) and expand your lungs. If it hurts to cough, hold a pillow against your chest or place the palms of both hands on top of the incision (use splinting) when you cough. This may help relieve discomfort.  If you were given an incentive spirometer, use it as directed. An incentive spirometer is a tool that measures how well you are filling your lungs with each breath.  Participate in pulmonary rehabilitation as directed by your health care provider. This is a program that combines education, exercise, and support from a team of specialists. The goal is to help you heal and get back to your normal activities as soon as possible. Medicines  Take over-the-counter or prescription medicines only as told by your health care  provider.  If you have pain, take pain-relieving medicine before your pain becomes severe. This is important because if your pain is under control, you will be able to breathe and cough more comfortably.  If you were prescribed an antibiotic medicine, take it as told by your health care provider. Do not stop taking the antibiotic even if you start to feel better. Activity  Ask your health care provider what activities are safe for you.  Avoid activities that use your  chest muscles for at least 3-4 weeks.  Do not lift anything that is heavier than 10 lb (4.5 kg), or the limit that your health care provider tells you, until he or she says that it is safe. Incision care  Follow instructions from your health care provider about how to take care of your incision(s). Make sure you: ? Wash your hands with soap and water before you change your bandage (dressing). If soap and water are not available, use hand sanitizer. ? Change your dressing as told by your health care provider. ? Leave stitches (sutures), skin glue, or adhesive strips in place. These skin closures may need to stay in place for 2 weeks or longer. If adhesive strip edges start to loosen and curl up, you may trim the loose edges. Do not remove adhesive strips completely unless your health care provider tells you to do that.  Keep your dressing dry until it has been removed.  Check your incision area every day for signs of infection. Check for: ? Redness, swelling, or pain. ? Fluid or blood. ? Warmth. ? Pus or a bad smell. Bathing  Do not take baths, swim, or use a hot tub until your health care provider approves. You may take showers.  After your dressing has been removed, use soap and water to gently wash your incision area. Do not use anything else to clean your incision(s) unless your health care provider tells you to do this. Driving   Do not drive until your health care provider approves.  Do not drive or use heavy  machinery while taking prescription pain medicine. Eating and drinking  Eat a healthy, balanced diet as instructed by your health care provider. A healthy diet includes plenty of fresh fruits and vegetables, whole grains, and low-fat (lean) proteins.  Limit foods that are high in fat and processed sugars, such as fried and sweet foods.  Drink enough fluid to keep your urine clear or pale yellow. General instructions   To prevent or treat constipation while you are taking prescription pain medicine, your health care provider may recommend that you: ? Take over-the-counter or prescription medicines. ? Eat foods that are high in fiber, such as beans, fresh fruits and vegetables, and whole grains.  Do not use any products that contain nicotine or tobacco, such as cigarettes and e-cigarettes. If you need help quitting, ask your health care provider.  Avoid secondhand smoke.  Wear compression stockings as told by your health care provider. These stockings help to prevent blood clots and reduce swelling in your legs.  If you have a chest tube, care for it as instructed by your health care provider. Do not travel by airplane during the 2 weeks after your chest tube is removed, or until your health care provider says that this is safe.  Keep all follow-up visits as told by your health care provider. This is important. Contact a health care provider if:  You have redness, swelling, or pain around an incision.  You have fluid or blood coming from an incision.  Your incision area feels warm to the touch.  You have pus or a bad smell coming from an incision.  You have a fever or chills.  You have nausea or vomiting.  You have pain that does not get better with medicine. Get help right away if:  You have chest pain.  Your heart is fluttering or beating rapidly.  You develop a rash.  You have shortness of breath or trouble breathing.  You are confused.  You have trouble  speaking.  You feel weak, light-headed, or dizzy.  You faint. Summary  To help prevent lung infection (pneumonia), take deep breaths or do breathing exercises as instructed by your health care provider.  Cough frequently to clear mucus (phlegm) and expand your lungs. If it hurts to cough, hold a pillow against your chest or place the palms of both hands on top of the incision (use splinting) when you cough.  If you have pain, take pain-relieving medicine before your pain becomes severe. This is important because if your pain is under control, you will be able to breathe and cough more comfortably.  Ask your health care provider what activities are safe for you. This information is not intended to replace advice given to you by your health care provider. Make sure you discuss any questions you have with your health care provider. Document Revised: 05/22/2017 Document Reviewed: 05/19/2016 Elsevier Patient Education  2020 [employer].

## 2019-09-26 ENCOUNTER — Ambulatory Visit
Admission: RE | Admit: 2019-09-26 | Discharge: 2019-09-26 | Disposition: A | Discharge status: Home / Self Care | Payer: Medicare Other | Source: Ambulatory Visit | Attending: Urology | Admitting: Urology

## 2019-09-26 ENCOUNTER — Other Ambulatory Visit: Payer: Self-pay

## 2019-09-26 DIAGNOSIS — R972 Elevated prostate specific antigen [PSA]: Secondary | ICD-10-CM

## 2019-09-27 ENCOUNTER — Encounter: Payer: Self-pay | Admitting: Emergency Medicine

## 2019-09-27 ENCOUNTER — Ambulatory Visit: Payer: Medicare Other | Admitting: Emergency Medicine

## 2019-09-27 DIAGNOSIS — R911 Solitary pulmonary nodule: Secondary | ICD-10-CM | POA: Diagnosis not present

## 2019-09-27 DIAGNOSIS — J449 Chronic obstructive pulmonary disease, unspecified: Secondary | ICD-10-CM | POA: Diagnosis not present

## 2019-09-27 DIAGNOSIS — F17200 Nicotine dependence, unspecified, uncomplicated: Secondary | ICD-10-CM

## 2019-09-27 DIAGNOSIS — IMO0001 Reserved for inherently not codable concepts without codable children: Secondary | ICD-10-CM

## 2019-09-27 NOTE — Assessment & Plan Note (Signed)
Stable symptoms.  Tolerating Stiolto.  Feels that he benefits significantly from it.  Plan to continue and follow-up regularly with Dr. Loanne Drilling.  He has albuterol which he uses rarely.  No flares since last time.

## 2019-09-27 NOTE — Assessment & Plan Note (Signed)
Heavy smoker.  Suspect he will have difficulty stopping completely prior to surgery but we will work hard on weaning him as much as possible.  Goal is to stop completely 2 weeks prior to surgery.  He has not benefited from Chantix in the past.  He does not want to try Wellbutrin.  Suspect that nicotine replacement will help some.  Encouraged him to use this.

## 2019-09-27 NOTE — Progress Notes (Signed)
   Subjective:    Patient ID: Chad Gomez, male    DOB: Sep 12, 1945, 74 y.o.   MRN: [id number]  HPI 74 year old active smoker (118 pack years), history of hypertension, chronic kidney disease, BPH.  He participates in the lung cancer screening program and had a scan on 08/01/2019 that showed a suspicious bilobed 2.6 cm lesion in the medial left upper lobe.  This prompted a PET scan which confirmed hypermetabolism but no distant disease.  He is referred now to discuss tissue diagnosis.  Pulmonary function testing done 08/08/2019 reviewed by me, shows very severe obstruction with a positive bronchodilator response, FEV1 47% predicted, mildly restricted volumes and a decreased diffusion capacity.  He is on Stiolto, has albuterol which he uses approximately  ROV 09/27/19 --Chad Gomez is 85, an active smoker with over 100 pack years.  He has a history of hypertension, CKD.  I met him after a left upper lobe mass was found on lung cancer screening.  He underwent navigational bronchoscopy 09/06/2019.  This showed evidence for non-small cell lung cancer.  He was seen by Dr. Servando Snare and thoracic surgery on 3/31 and is under consideration for surgical resection. He remains on Stiolto, does believe that it helps him, helps his functional capacity. Has albuterol, has not been needing it. He is still smoking about 1 pk/day. He has used nicotine replacement in the past, hypnosis, chantix which was ineffective.    Review of Systems As per HPI     Objective:   Physical Exam Vitals:   09/27/19 1203  BP: 124/78  Pulse: 96  Temp: 97.8 F (36.6 C)  TempSrc: Temporal  SpO2: 95%  Weight: 181 lb 6.4 oz (82.3 kg)  Height: '5\' 10"'$  (1.778 m)   Gen: Pleasant, well-nourished, in no distress,  normal affect  ENT: No lesions,  mouth clear,  oropharynx clear, no postnasal drip  Neck: No JVD, no stridor  Lungs: No use of accessory muscles, very distant, no wheeze  Cardiovascular: RRR, heart sounds normal, no murmur or  gallops, trace peripheral edema  Musculoskeletal: No deformities, no cyanosis or clubbing  Neuro: alert, awake, non focal  Skin: Warm, no lesions or rash      Assessment & Plan:  Stage 4 very severe COPD by GOLD classification (HCC) Stable symptoms.  Tolerating Stiolto.  Feels that he benefits significantly from it.  Plan to continue and follow-up regularly with Dr. Loanne Drilling.  He has albuterol which he uses rarely.  No flares since last time.  Lung nodule < 6cm on CT Diagnosed as non-small cell lung cancer.  He has met with Dr. Servando Snare and is planning for possible surgical resection.  Cardiac preop evaluation is pending.  Goal is for smoking cessation about 2 weeks prior to procedure.  Compulsive tobacco user syndrome Heavy smoker.  Suspect he will have difficulty stopping completely prior to surgery but we will work hard on weaning him as much as possible.  Goal is to stop completely 2 weeks prior to surgery.  He has not benefited from Chantix in the past.  He does not want to try Wellbutrin.  Suspect that nicotine replacement will help some.  Encouraged him to use this.  Baltazar Apo, MD, PhD 09/27/2019, 12:27 PM [facility] [phone] or if no answer [phone]

## 2019-09-27 NOTE — Patient Instructions (Signed)
Please continue Stiolto 2 puffs once daily as you have been taking it. Keep your albuterol available to use 2 puffs if needed for shortness of breath, chest tightness, wheezing. Agree with Dr. Servando Snare that you need to work hard on decreasing your smoking.  Goal would be to stop completely about 2 weeks prior to any planned surgery. We will be happy to assist with your care when you are admitted to the hospital for your lung surgery. Follow with Dr Loanne Drilling in 3 months.

## 2019-09-27 NOTE — Assessment & Plan Note (Signed)
Diagnosed as non-small cell lung cancer.  He has met with Dr. Servando Snare and is planning for possible surgical resection.  Cardiac preop evaluation is pending.  Goal is for smoking cessation about 2 weeks prior to procedure.

## 2019-09-28 ENCOUNTER — Ambulatory Visit
Admission: RE | Admit: 2019-09-28 | Discharge: 2019-09-28 | Disposition: A | Discharge status: Home / Self Care | Payer: Medicare Other | Source: Ambulatory Visit | Attending: Cardiothoracic Surgery | Admitting: Cardiothoracic Surgery

## 2019-09-28 DIAGNOSIS — E041 Nontoxic single thyroid nodule: Secondary | ICD-10-CM

## 2019-09-28 DIAGNOSIS — E042 Nontoxic multinodular goiter: Secondary | ICD-10-CM | POA: Diagnosis not present

## 2019-10-06 DIAGNOSIS — Z7189 Other specified counseling: Secondary | ICD-10-CM | POA: Insufficient documentation

## 2019-10-06 DIAGNOSIS — E785 Hyperlipidemia, unspecified: Secondary | ICD-10-CM | POA: Insufficient documentation

## 2019-10-06 DIAGNOSIS — E118 Type 2 diabetes mellitus with unspecified complications: Secondary | ICD-10-CM | POA: Insufficient documentation

## 2019-10-06 NOTE — Progress Notes (Signed)
Cardiology Office Note   Date:  10/07/2019   ID:  Chad Gomez Aug 28, 1945, MRN [id number]  PCP:  [name], MD  Cardiologist:   Minus Breeding, MD Referring:  Ceasar Mons, MD  Chief Complaint  Patient presents with  . Pre-op Exam      History of Present Illness: Chad Gomez is a 74 y.o. male who is sent by Dr. Servando Snare for preop clearance prior to resection of a small cell lung cancer.  He is seen by Dr. Lamonte Sakai for evaluation of severe COPD.    I saw him over three years ago for evaluation of an abnormal EKG.  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  The patient had a perfusion study that was negative for ischemia.   He has had no recent cardiovascular symptoms.  He is limited by hip pain and can only walk about half a block.  He is relatively sedentary.  He does not have any chest pressure, neck or arm discomfort.  He does not describe excessive shortness of breath, PND or orthopnea.  He does not have palpitations, presyncope or syncope.  His biggest recent problem has been bladder outlet obstruction and he has an indwelling Foley catheter.  Past Medical History:  Diagnosis Date  . BPH (benign prostatic hypertrophy)    weak stream  . Chronic kidney disease   . Complication of anesthesia    urinary retention  . COPD (chronic obstructive pulmonary disease) ([place])   . Diabetes mellitus without complication ([place])    Type II  . History of bladder stone   . History of colon polyps    08/2013   pre-canerous  . History of kidney stones   . History of melanoma excision    LEFT EAR  . Hyperlipidemia   . Hypertension   . Iron deficiency   . Open wound    left lower jaw; followed by ENT  . Peripheral vascular disease ([place])   . Right ureteral stone   . Shingles 05-15-2015    Past Surgical History:  Procedure Laterality Date  . ABDOMINAL AORTAGRAM  11/01/2014   Procedure: Abdominal Aortagram;  Surgeon: Serafina Mitchell, MD;  Location: [place] [facility];  Service: Cardiovascular;;  . AORTA - BILATERAL FEMORAL ARTERY BYPASS GRAFT N/A 12/28/2014   Procedure: AORTOBIFEMORAL BYPASS GRAFT;  Surgeon: Serafina Mitchell, MD;  Location: [place];  Service: Vascular;  Laterality: N/A;  . BRONCHIAL BRUSHINGS  09/06/2019   Procedure: BRONCHIAL BRUSHINGS;  Surgeon: Collene Gobble, MD;  Location: [facility] ENDOSCOPY;  Service: Pulmonary;;  left upper lobe nodule  . BRONCHIAL WASHINGS  09/06/2019   Procedure: BRONCHIAL WASHINGS;  Surgeon: Collene Gobble, MD;  Location: [facility] ENDOSCOPY;  Service: Pulmonary;;  . COLONOSCOPY W/ POLYPECTOMY  09-13-2013  . CYSTOLITHALOPAXY OF BLADDER STONE/ TRANSRECTAL ULTRASOUND PROSTATE BX  08-19-2005  . CYSTOSCOPY W/ RETROGRADES Right 12/02/2012   Procedure: CYSTOSCOPY WITH RETROGRADE PYELOGRAM;  Surgeon: Malka So, MD;  Location: [facility];  Service: Urology;  Laterality: Right;  . CYSTOSCOPY W/ RETROGRADES Bilateral 10/13/2013   Procedure: CYSTOSCOPY WITH RETROGRADE PYELOGRAM;  Surgeon: Irine Seal, MD;  [facility];  Service: Urology;  Laterality: Bilateral;  . CYSTOSCOPY W/ URETERAL STENT PLACEMENT Left 04/11/2014   Procedure: CYSTOSCOPY WITH STENT REPLACEMENT;  Surgeon: Malka So, MD;  Location: [facility];  Service: Urology;  Laterality: Left;  . CYSTOSCOPY WITH LITHOLAPAXY  Right 12/02/2012   Procedure: CYSTOSCOPY WITH stone extraction;  Surgeon: Malka So, MD;  Location: [facility];  Service: Urology;  Laterality: Right;  . CYSTOSCOPY WITH LITHOLAPAXY Right 10/13/2013   Procedure: CYSTOSCOPY WITH LITHOLAPAXY, removal of urethral stone with basket;  Surgeon: Irine Seal, MD;  Location: [facility];  Service: Urology;  Laterality: Right;  . CYSTOSCOPY WITH RETROGRADE PYELOGRAM, URETEROSCOPY AND STENT PLACEMENT Left 03/21/2014   Procedure: CYSTOSCOPY WITH RETROGRADE PYELOGRAM, AND LEFT STENT PLACEMENT;   Surgeon: Alexis Frock, MD;  Location: [facility];  Service: Urology;  Laterality: Left;  . CYSTOSCOPY WITH RETROGRADE PYELOGRAM, URETEROSCOPY AND STENT PLACEMENT  09/09/2016   Procedure: CYSTOSCOPY WITH RIGHT  RETROGRADE PYELOGRAM, URETEROSCOPY WITH LASER BASKET EXTRACTION OF STONES  AND STENT PLACEMENT;  Surgeon: Irine Seal, MD;  Location: [facility];  Service: Urology;;  . Consuela Mimes WITH URETEROSCOPY Left 04/11/2014   Procedure: CYSTOSCOPY WITH URETEROSCOPY/ STONE EXTRACTION;  Surgeon: Malka So, MD;  Location: [facility];  Service: Urology;  Laterality: Left;  . FINE NEEDLE ASPIRATION  09/06/2019   Procedure: FINE NEEDLE ASPIRATION;  Surgeon: Collene Gobble, MD;  Location: [facility] ENDOSCOPY;  Service: Pulmonary;;  left upper lobe - lung  . HOLMIUM LASER APPLICATION Right 1/74/9449   Procedure: HOLMIUM LASER APPLICATION;  Surgeon: Malka So, MD;  Location: [facility];  Service: Urology;  Laterality: Right;  . HOLMIUM LASER APPLICATION Left 67/59/1638   Procedure: HOLMIUM LASER APPLICATION;  Surgeon: Malka So, MD;  Location: [facility]  Service: Urology;  Laterality: Left;  . HOLMIUM LASER APPLICATION Right 4/66/5993   Procedure: HOLMIUM LASER APPLICATION;  Surgeon: Irine Seal, MD;  Location: [facility];  Service: Urology;  Laterality: Right;  . INGUINAL HERNIA REPAIR Right 1954  . LITHOTRIPSY  2007 & 02/2013  . LUNG BIOPSY  09/06/2019   Procedure: LUNG BIOPSY;  Surgeon: Collene Gobble, MD;  Location: [facility] ENDOSCOPY;  Service: Pulmonary;;  left upper lob lung  . [place]  . MICROLARYNGOSCOPY Right 02/18/2016   Procedure: MICRO DIRECT LARYNGOSCOPY EXCISIONAL BIOPSY OF RIGHT VOCAL CORD MASS;  Surgeon: Leta Baptist, MD;  Location: [place];  Service: ENT;  Laterality: Right;  . [place]   left ear  . MULTIPLE TOOTH EXTRACTIONS  1994  . NEPHROLITHOTOMY Left 01/27/2013    Procedure: NEPHROLITHOTOMY PERCUTANEOUS FIRST LOOK;  Surgeon: Malka So, MD;  [facility];  Service: Urology;  Laterality: Left;  . PERIPHERAL VASCULAR CATHETERIZATION N/A 11/01/2014   Procedure: Lower Extremity Angiography;  Surgeon: Serafina Mitchell, MD;  Location: [place] [facility];  Service: Cardiovascular;  Laterality: N/A;  . URETEROSCOPY Right 12/02/2012   Procedure: RIGHT URETEROSCOPY STONE EXTRACTION WITH  STENT PLACEMENT;  Surgeon: Malka So, MD;  [facility];  Service: Urology;  Laterality: Right;  Marland Kitchen VIDEO BRONCHOSCOPY WITH ENDOBRONCHIAL NAVIGATION N/A 09/06/2019   Procedure: VIDEO BRONCHOSCOPY WITH ENDOBRONCHIAL NAVIGATION;  Surgeon: Collene Gobble, MD;  Location: [place] ENDOSCOPY;  Service: Pulmonary;  Laterality: N/A;     Current Outpatient Medications  Medication Sig Dispense Refill  . albuterol (PROVENTIL HFA;VENTOLIN HFA) 108 (90 Base) MCG/ACT inhaler Inhale 2 puffs into the lungs every 6 (six) hours as needed for wheezing or shortness of breath. 1 Inhaler 6  . amLODipine (NORVASC) 10 MG tablet Take 10 mg by mouth daily.    Marland Kitchen aspirin EC 81 MG tablet  Take 81 mg by mouth daily.    Marland Kitchen atorvastatin (LIPITOR) 40 MG tablet Take 40 mg by mouth daily.    . cetirizine (ZYRTEC) 10 MG tablet Take 10 mg by mouth daily.    . Cholecalciferol (VITAMIN D-3) 1000 units CAPS Take 1,000 Units by mouth daily.     Marland Kitchen ezetimibe (ZETIA) 10 MG tablet Take 5 mg by mouth daily.    . finasteride (PROSCAR) 5 MG tablet Take 5 mg by mouth daily.    . fluticasone (FLONASE) 50 MCG/ACT nasal spray Place 2 sprays into both nostrils daily.     . indapamide (LOZOL) 1.25 MG tablet Take 1.25 mg by mouth daily.    . Magnesium 250 MG TABS Take 1 tablet by mouth daily.    . metFORMIN (GLUCOPHAGE-XR) 500 MG 24 hr tablet Take 500-1,000 mg by mouth See admin instructions. Take 1 tablet every morning and take 2 tablets at supper    . potassium citrate (UROCIT-K) 10 MEQ (1080 MG) SR tablet  Take 10 mEq by mouth 3 (three) times daily with meals.     . tamsulosin (FLOMAX) 0.4 MG CAPS capsule Take 0.4 mg by mouth in the morning and at bedtime.    . Tiotropium Bromide-Olodaterol (STIOLTO RESPIMAT) 2.5-2.5 MCG/ACT AERS Inhale 2 puffs into the lungs daily. 1 Inhaler 11   No current facility-administered medications for this visit.    Allergies:   Morphine and related, Tape, Amoxicillin, and Naproxen    Social History:  The patient  reports that he has been smoking cigarettes. He started smoking about 60 years ago. He has a 59.00 pack-year smoking history. He has never used smokeless tobacco. He reports that he does not drink alcohol or use drugs.   Family History:  The patient's family history includes Dementia in his father and mother; Hypertension in his mother and sister; Stroke (age of onset: 59) in his mother.    ROS:  Please see the history of present illness.   Otherwise, review of systems are positive for none.   All other systems are reviewed and negative.    PHYSICAL EXAM: VS:  BP 110/70   Pulse 96   Ht 5\' 10"  (1.778 m)   Wt 182 lb (82.6 kg)   SpO2 95%   BMI 26.11 kg/m  , BMI Body mass index is 26.11 kg/m. GENERAL:  Well appearing HEENT:  Pupils equal round and reactive, fundi not visualized, oral mucosa unremarkable NECK:  No jugular venous distention, waveform within normal limits, carotid upstroke brisk and symmetric, no bruits, no thyromegaly LYMPHATICS:  No cervical, inguinal adenopathy LUNGS: Decreased bilateral breath sounds BACK:  No CVA tenderness CHEST:  Unremarkable HEART:  PMI not displaced or sustained,S1 and S2 within normal limits, no S3, no S4, no clicks, no rubs, no murmurs ABD:  Flat, positive bowel sounds normal in frequency in pitch, no bruits, no rebound, no guarding, no midline pulsatile mass, no hepatomegaly, no splenomegaly, well-healed surgical scar EXT:  2 plus pulses palpable pulses, decreased dorsalis pedis and posterior heels  bilaterally, no edema, no cyanosis no clubbing SKIN:  No rashes no nodules NEURO:  Cranial nerves II through XII grossly intact, motor grossly intact throughout PSYCH:  Cognitively intact, oriented to person place and time    EKG:  EKG is ordered today. The ekg ordered today demonstrates sinus rhythm, rate 86, axis within normal limits, intervals within normal limits, RSR prime V1 V2, low voltage chest leads.   Recent Labs: 09/06/2019: ALT 17 09/07/2019:  BUN 12; Creatinine, Ser 1.06; Hemoglobin 15.4; Magnesium 1.6; Platelets 198; Potassium 4.2; Sodium 133    Lipid Panel No results found for: CHOL, TRIG, HDL, CHOLHDL, VLDL, LDLCALC, LDLDIRECT    Wt Readings from Last 3 Encounters:  10/07/19 182 lb (82.6 kg)  09/27/19 181 lb 6.4 oz (82.3 kg)  09/21/19 182 lb (82.6 kg)      Other studies Reviewed: Additional studies/ records that were reviewed today include: Cardiovascular surgery notes, CT, PET. Review of the above records demonstrates:  Please see elsewhere in the note.     ASSESSMENT AND PLAN:  PREOP CLEARANCE:   The patient has a low functional status.  He is going for a moderate to high risk surgical procedure.  He has a known peripheral vascular disease and coronary calcification noted.  Given all of this further preoperative testing is indicated.  He would be able to walk on treadmill.  Therefore, he will need a Lexiscan Myoview.  HTN: Blood pressure is controlled.  No change in therapy.  DYSLIPIDEMIA: LDL recently was 44 with an HDL of 42.  No change in therapy.  TOBACCO ABUSE: He has tried everything to stop smoking including Chantix and hypnosis.  He is hopelessly addicted.  DM: A1c is 6.2.  No change in therapy.  COVID EDUCATION: He has some vaccine hesitancy and we went through the science and results.  He might consent.  Current medicines are reviewed at length with the patient today.  The patient does not have concerns regarding medicines.  The following  changes have been made:  no change  Labs/ tests ordered today include:   Orders Placed This Encounter  Procedures  . MYOCARDIAL PERFUSION IMAGING  . EKG 12-Lead     Disposition:   FU with me as needed.      Signed, Minus Breeding, MD  10/07/2019 2:16 PM    [facility]

## 2019-10-07 ENCOUNTER — Other Ambulatory Visit: Payer: Self-pay

## 2019-10-07 ENCOUNTER — Ambulatory Visit: Payer: Medicare Other | Admitting: Cardiology

## 2019-10-07 ENCOUNTER — Encounter: Payer: Self-pay | Admitting: Cardiology

## 2019-10-07 VITALS — BP 110/70 | HR 96 | Ht 70.0 in | Wt 182.0 lb

## 2019-10-07 DIAGNOSIS — Z720 Tobacco use: Secondary | ICD-10-CM

## 2019-10-07 DIAGNOSIS — I10 Essential (primary) hypertension: Secondary | ICD-10-CM

## 2019-10-07 DIAGNOSIS — E785 Hyperlipidemia, unspecified: Secondary | ICD-10-CM | POA: Diagnosis not present

## 2019-10-07 DIAGNOSIS — E118 Type 2 diabetes mellitus with unspecified complications: Secondary | ICD-10-CM | POA: Diagnosis not present

## 2019-10-07 DIAGNOSIS — Z01810 Encounter for preprocedural cardiovascular examination: Secondary | ICD-10-CM

## 2019-10-07 DIAGNOSIS — R079 Chest pain, unspecified: Secondary | ICD-10-CM

## 2019-10-07 DIAGNOSIS — Z7189 Other specified counseling: Secondary | ICD-10-CM

## 2019-10-07 NOTE — Patient Instructions (Signed)
Medication Instructions:  NO CHANGES *If you need a refill on your cardiac medications before your next appointment, please call your pharmacy*  Lab Work: NONE ORDERED THIS VISIT  Testing/Procedures: Your physician has requested that you have a lexiscan myoview. For further information please visit [email or web address]. Please follow instruction sheet, as given.  Follow-Up: At [facility], you and your health needs are our priority.  As part of our continuing mission to provide you with exceptional heart care, we have created designated Provider Care Teams.  These Care Teams include your primary Cardiologist (physician) and Advanced Practice Providers (APPs -  Physician Assistants and Nurse Practitioners) who all work together to provide you with the care you need, when you need it.  Your next appointment:   FOLLOW UP AS NEEDED  Other Instructions Your physician has requested that you have a lexiscan myoview. For further information please visit [email or web address]. Please follow instruction sheet, as given. This will take place at [place], [address]  How to prepare for your Myocardial Perfusion Test:  Do not eat or drink 3 hours prior to your test, except you may have water.  Do not consume products containing caffeine (regular or decaffeinated) 12 hours prior to your test. (ex: coffee, chocolate, sodas, tea).  Do bring a list of your current medications with you.  If not listed below, you may take your medications as normal.  Do wear comfortable clothes (no dresses or overalls) and walking shoes, tennis shoes preferred (No heels or open toe shoes are allowed).  Do NOT wear cologne, perfume, aftershave, or lotions (deodorant is allowed).  The test will take approximately 3 to 4 hours to complete  If these instructions are not followed, your test will have to be rescheduled.

## 2019-10-10 DIAGNOSIS — B372 Candidiasis of skin and nail: Secondary | ICD-10-CM | POA: Diagnosis not present

## 2019-10-10 DIAGNOSIS — R338 Other retention of urine: Secondary | ICD-10-CM | POA: Diagnosis not present

## 2019-10-11 DIAGNOSIS — R338 Other retention of urine: Secondary | ICD-10-CM | POA: Diagnosis not present

## 2019-10-13 DIAGNOSIS — R338 Other retention of urine: Secondary | ICD-10-CM | POA: Diagnosis not present

## 2019-10-19 ENCOUNTER — Telehealth (HOSPITAL_COMMUNITY): Payer: Self-pay

## 2019-10-19 NOTE — Telephone Encounter (Signed)
Encounter complete. 

## 2019-10-21 ENCOUNTER — Ambulatory Visit (HOSPITAL_COMMUNITY)
Admission: RE | Admit: 2019-10-21 | Discharge: 2019-10-21 | Disposition: A | Discharge status: Home / Self Care | Payer: Medicare Other | Source: Ambulatory Visit | Attending: Cardiovascular Disease | Admitting: Cardiovascular Disease

## 2019-10-21 ENCOUNTER — Other Ambulatory Visit: Payer: Self-pay

## 2019-10-21 DIAGNOSIS — R079 Chest pain, unspecified: Secondary | ICD-10-CM | POA: Insufficient documentation

## 2019-10-21 LAB — MYOCARDIAL PERFUSION IMAGING
LV dias vol: 60 mL (ref 62–150)
LV sys vol: 15 mL
Peak HR: 113 {beats}/min
Rest HR: 86 {beats}/min
SDS: 0
SRS: 3
SSS: 3
TID: 0.79

## 2019-10-21 MED ORDER — TECHNETIUM TC 99M TETROFOSMIN IV KIT
10.5000 | PACK | Freq: Once | INTRAVENOUS | Status: AC | PRN
  Administered 2019-10-21: 10.5 via INTRAVENOUS
  Filled 2019-10-21: qty 11

## 2019-10-21 MED ORDER — REGADENOSON 0.4 MG/5ML IV SOLN
0.4000 mg | Freq: Once | INTRAVENOUS | Status: AC
  Administered 2019-10-21: 0.4 mg via INTRAVENOUS

## 2019-10-21 MED ORDER — TECHNETIUM TC 99M TETROFOSMIN IV KIT
30.6000 | PACK | Freq: Once | INTRAVENOUS | Status: AC | PRN
  Administered 2019-10-21: 30.6 via INTRAVENOUS
  Filled 2019-10-21: qty 31

## 2019-10-24 ENCOUNTER — Other Ambulatory Visit: Payer: Self-pay | Admitting: *Deleted

## 2019-10-24 DIAGNOSIS — R911 Solitary pulmonary nodule: Secondary | ICD-10-CM

## 2019-10-25 ENCOUNTER — Other Ambulatory Visit: Payer: Self-pay | Admitting: *Deleted

## 2019-10-25 DIAGNOSIS — R911 Solitary pulmonary nodule: Secondary | ICD-10-CM

## 2019-10-25 NOTE — Pre-Procedure Instructions (Addendum)
[place] ([address]), [place] [address] [place] [address] Phone: [phone] Fax: [phone]  [place], [place] [facility] [address] [place] [address] [place] [address] Phone: [phone] Fax: [phone]      Your procedure is scheduled on Friday, May 7th.  Report to [facility] Main Entrance "A" at 10:50 A.M., and check in at the Admitting office.  Call this number if you have problems the morning of surgery:  [phone]  Call [phone] if you have any questions prior to your surgery date Monday-Friday 8am-4pm    Remember:  Do not eat or drink after midnight the night before your surgery    Take these medicines the morning of surgery with A SIP OF WATER  amLODipine (NORVASC) atorvastatin (LIPITOR)  cetirizine (ZYRTEC)  ezetimibe (ZETIA) finasteride (PROSCAR) fluticasone (FLONASE) tamsulosin (FLOMAX)  Tiotropium Bromide-Olodaterol (STIOLTO RESPIMAT)  albuterol (PROVENTIL HFA;VENTOLIN HFA) -if needed, please bring it with you to the hospital on the day of surgery.   Follow your surgeon's instructions on when to stop Aspirin.  If no instructions were given by your surgeon then you will need to call the office to get those instructions.    As of today, STOP taking any Aspirin (unless otherwise instructed by your surgeon) and Aspirin containing products, Aleve, Naproxen, Ibuprofen, Motrin, Advil, Goody's, BC's, all herbal medications, fish oil, and all vitamins.                      Do not wear jewelry, make up, or nail polish            Do not wear lotions, powders, perfumes/colognes, or deodorant.            Do not shave 48 hours prior to surgery.  Men may shave face and neck.            Do not bring valuables to the hospital.            [facility] is not responsible for any belongings or valuables.  Do NOT Smoke (Tobacco/Vapping) or drink Alcohol 24 hours prior to  your procedure If you use a CPAP at night, you may bring all equipment for your overnight stay.   Contacts, glasses, dentures or bridgework may not be worn into surgery.      For patients admitted to the hospital, discharge time will be determined by your treatment team.   Patients discharged the day of surgery will not be allowed to drive home, and someone needs to stay with them for 24 hours.    Special instructions:   - Preparing For Surgery  Before surgery, you can play an important role. Because skin is not sterile, your skin needs to be as free of germs as possible. You can reduce the number of germs on your skin by washing with CHG (chlorahexidine gluconate) Soap before surgery.  CHG is an antiseptic cleaner which kills germs and bonds with the skin to continue killing germs even after washing.    Oral Hygiene is also important to reduce your risk of infection.  Remember - BRUSH YOUR TEETH THE MORNING OF SURGERY WITH YOUR REGULAR TOOTHPASTE  Please do not use if you have an allergy to CHG or antibacterial soaps. If your skin becomes reddened/irritated stop using the CHG.  Do not shave (including legs and underarms) for at least 48 hours prior to first CHG shower.  It is OK to shave your face.  Please follow these instructions carefully.   1. Shower the NIGHT BEFORE SURGERY and the MORNING OF SURGERY with CHG Soap.   2. If you chose to wash your hair, wash your hair first as usual with your normal shampoo.  3. After you shampoo, rinse your hair and body thoroughly to remove the shampoo.  4. Use CHG as you would any other liquid soap. You can apply CHG directly to the skin and wash gently with a scrungie or a clean washcloth.   5. Apply the CHG Soap to your body ONLY FROM THE NECK DOWN.  Do not use on open wounds or open sores. Avoid contact with your eyes, ears, mouth and genitals (private parts). Wash Face and genitals (private parts)  with your normal soap.    6. Wash thoroughly, paying special attention to the area where your surgery will be performed.  7. Thoroughly rinse your body with warm water from the neck down.  8. DO NOT shower/wash with your normal soap after using and rinsing off the CHG Soap.  9. Pat yourself dry with a CLEAN TOWEL.  10. Wear CLEAN PAJAMAS to bed the night before surgery, wear comfortable clothes the morning of surgery  11. Place CLEAN SHEETS on your bed the night of your first shower and DO NOT SLEEP WITH PETS.   Day of Surgery:   Do not apply any deodorants/lotions.  Please wear clean clothes to the hospital/surgery center.   Remember to brush your teeth WITH YOUR REGULAR TOOTHPASTE.   Please read over the following fact sheets that you were given.

## 2019-10-26 ENCOUNTER — Encounter (HOSPITAL_COMMUNITY): Payer: Self-pay

## 2019-10-26 ENCOUNTER — Other Ambulatory Visit (HOSPITAL_COMMUNITY)
Admission: RE | Admit: 2019-10-26 | Discharge: 2019-10-26 | Disposition: A | Discharge status: Home / Self Care | Payer: Medicare Other | Source: Ambulatory Visit | Attending: Cardiothoracic Surgery | Admitting: Cardiothoracic Surgery

## 2019-10-26 ENCOUNTER — Encounter (HOSPITAL_COMMUNITY)
Admission: RE | Admit: 2019-10-26 | Discharge: 2019-10-26 | Disposition: A | Discharge status: Home / Self Care | Payer: Medicare Other | Source: Ambulatory Visit | Attending: Cardiothoracic Surgery | Admitting: Cardiothoracic Surgery

## 2019-10-26 ENCOUNTER — Other Ambulatory Visit: Payer: Self-pay

## 2019-10-26 ENCOUNTER — Ambulatory Visit (HOSPITAL_COMMUNITY)
Admission: RE | Admit: 2019-10-26 | Discharge: 2019-10-26 | Disposition: A | Discharge status: Home / Self Care | Payer: Medicare Other | Source: Ambulatory Visit | Attending: Cardiothoracic Surgery | Admitting: Cardiothoracic Surgery

## 2019-10-26 DIAGNOSIS — E785 Hyperlipidemia, unspecified: Secondary | ICD-10-CM | POA: Diagnosis not present

## 2019-10-26 DIAGNOSIS — I700 Atherosclerosis of aorta: Secondary | ICD-10-CM | POA: Insufficient documentation

## 2019-10-26 DIAGNOSIS — R338 Other retention of urine: Secondary | ICD-10-CM | POA: Diagnosis not present

## 2019-10-26 DIAGNOSIS — N189 Chronic kidney disease, unspecified: Secondary | ICD-10-CM | POA: Diagnosis not present

## 2019-10-26 DIAGNOSIS — E1122 Type 2 diabetes mellitus with diabetic chronic kidney disease: Secondary | ICD-10-CM | POA: Diagnosis not present

## 2019-10-26 DIAGNOSIS — Z85820 Personal history of malignant melanoma of skin: Secondary | ICD-10-CM | POA: Diagnosis not present

## 2019-10-26 DIAGNOSIS — K8020 Calculus of gallbladder without cholecystitis without obstruction: Secondary | ICD-10-CM | POA: Diagnosis not present

## 2019-10-26 DIAGNOSIS — E611 Iron deficiency: Secondary | ICD-10-CM | POA: Diagnosis not present

## 2019-10-26 DIAGNOSIS — R911 Solitary pulmonary nodule: Secondary | ICD-10-CM | POA: Insufficient documentation

## 2019-10-26 DIAGNOSIS — I2510 Atherosclerotic heart disease of native coronary artery without angina pectoris: Secondary | ICD-10-CM | POA: Insufficient documentation

## 2019-10-26 DIAGNOSIS — E1142 Type 2 diabetes mellitus with diabetic polyneuropathy: Secondary | ICD-10-CM | POA: Diagnosis not present

## 2019-10-26 DIAGNOSIS — C3412 Malignant neoplasm of upper lobe, left bronchus or lung: Secondary | ICD-10-CM | POA: Diagnosis not present

## 2019-10-26 DIAGNOSIS — Z885 Allergy status to narcotic agent status: Secondary | ICD-10-CM | POA: Diagnosis not present

## 2019-10-26 DIAGNOSIS — Z01818 Encounter for other preprocedural examination: Secondary | ICD-10-CM | POA: Insufficient documentation

## 2019-10-26 DIAGNOSIS — E1151 Type 2 diabetes mellitus with diabetic peripheral angiopathy without gangrene: Secondary | ICD-10-CM | POA: Diagnosis not present

## 2019-10-26 DIAGNOSIS — Z87442 Personal history of urinary calculi: Secondary | ICD-10-CM | POA: Diagnosis not present

## 2019-10-26 DIAGNOSIS — N401 Enlarged prostate with lower urinary tract symptoms: Secondary | ICD-10-CM | POA: Insufficient documentation

## 2019-10-26 DIAGNOSIS — Z902 Acquired absence of lung [part of]: Secondary | ICD-10-CM | POA: Diagnosis not present

## 2019-10-26 DIAGNOSIS — I739 Peripheral vascular disease, unspecified: Secondary | ICD-10-CM | POA: Insufficient documentation

## 2019-10-26 DIAGNOSIS — J439 Emphysema, unspecified: Secondary | ICD-10-CM | POA: Insufficient documentation

## 2019-10-26 DIAGNOSIS — N138 Other obstructive and reflux uropathy: Secondary | ICD-10-CM | POA: Insufficient documentation

## 2019-10-26 DIAGNOSIS — R918 Other nonspecific abnormal finding of lung field: Secondary | ICD-10-CM | POA: Diagnosis not present

## 2019-10-26 DIAGNOSIS — I129 Hypertensive chronic kidney disease with stage 1 through stage 4 chronic kidney disease, or unspecified chronic kidney disease: Secondary | ICD-10-CM | POA: Diagnosis not present

## 2019-10-26 DIAGNOSIS — Z794 Long term (current) use of insulin: Secondary | ICD-10-CM | POA: Diagnosis not present

## 2019-10-26 DIAGNOSIS — N320 Bladder-neck obstruction: Secondary | ICD-10-CM | POA: Diagnosis not present

## 2019-10-26 DIAGNOSIS — I10 Essential (primary) hypertension: Secondary | ICD-10-CM | POA: Diagnosis not present

## 2019-10-26 DIAGNOSIS — Z20822 Contact with and (suspected) exposure to covid-19: Secondary | ICD-10-CM | POA: Insufficient documentation

## 2019-10-26 DIAGNOSIS — J449 Chronic obstructive pulmonary disease, unspecified: Secondary | ICD-10-CM | POA: Diagnosis not present

## 2019-10-26 DIAGNOSIS — Z7982 Long term (current) use of aspirin: Secondary | ICD-10-CM | POA: Diagnosis not present

## 2019-10-26 DIAGNOSIS — Z8719 Personal history of other diseases of the digestive system: Secondary | ICD-10-CM | POA: Diagnosis not present

## 2019-10-26 DIAGNOSIS — Z7901 Long term (current) use of anticoagulants: Secondary | ICD-10-CM | POA: Diagnosis not present

## 2019-10-26 DIAGNOSIS — J9382 Other air leak: Secondary | ICD-10-CM | POA: Diagnosis not present

## 2019-10-26 LAB — URINALYSIS, ROUTINE W REFLEX MICROSCOPIC
Bilirubin Urine: NEGATIVE
Glucose, UA: NEGATIVE mg/dL
Ketones, ur: NEGATIVE mg/dL
Nitrite: POSITIVE — AB
Protein, ur: NEGATIVE mg/dL
Specific Gravity, Urine: 1.008 (ref 1.005–1.030)
WBC, UA: >50 WBC/hpf — ABNORMAL HIGH (ref 0–5)
pH: 7 (ref 5.0–8.0)

## 2019-10-26 LAB — BLOOD GAS, ARTERIAL
Acid-Base Excess: 2.8 mmol/L — ABNORMAL HIGH (ref 0.0–2.0)
Bicarbonate: 26.6 mmol/L (ref 20.0–28.0)
FIO2: 21
O2 Saturation: 98.3 %
Patient temperature: 37
pCO2 arterial: 38.8 mmHg (ref 32.0–48.0)
pH, Arterial: 7.45 (ref 7.350–7.450)
pO2, Arterial: 106 mmHg (ref 83.0–108.0)

## 2019-10-26 LAB — COMPREHENSIVE METABOLIC PANEL
ALT: 20 U/L (ref 0–44)
AST: 19 U/L (ref 15–41)
Albumin: 4.1 g/dL (ref 3.5–5.0)
Alkaline Phosphatase: 84 U/L (ref 38–126)
Anion gap: 10 (ref 5–15)
BUN: 7 mg/dL — ABNORMAL LOW (ref 8–23)
CO2: 24 mmol/L (ref 22–32)
Calcium: 10.1 mg/dL (ref 8.9–10.3)
Chloride: 102 mmol/L (ref 98–111)
Creatinine, Ser: 1 mg/dL (ref 0.61–1.24)
GFR calc Af Amer: >60 mL/min (ref >60)
GFR calc non Af Amer: >60 mL/min (ref >60)
Glucose, Bld: 120 mg/dL — ABNORMAL HIGH (ref 70–99)
Potassium: 4 mmol/L (ref 3.5–5.1)
Sodium: 136 mmol/L (ref 135–145)
Total Bilirubin: 0.3 mg/dL (ref 0.3–1.2)
Total Protein: 6.5 g/dL (ref 6.5–8.1)

## 2019-10-26 LAB — SURGICAL PCR SCREEN
MRSA, PCR: NEGATIVE
Staphylococcus aureus: NEGATIVE

## 2019-10-26 LAB — CBC
HCT: 49.7 % (ref 39.0–52.0)
Hemoglobin: 16.9 g/dL (ref 13.0–17.0)
MCH: 31.5 pg (ref 26.0–34.0)
MCHC: 34 g/dL (ref 30.0–36.0)
MCV: 92.6 fL (ref 80.0–100.0)
Platelets: 223 10*3/uL (ref 150–400)
RBC: 5.37 MIL/uL (ref 4.22–5.81)
RDW: 14.3 % (ref 11.5–15.5)
WBC: 7.6 10*3/uL (ref 4.0–10.5)
nRBC: 0 % (ref 0.0–0.2)

## 2019-10-26 LAB — APTT: aPTT: 30 seconds (ref 24–36)

## 2019-10-26 LAB — TYPE AND SCREEN
ABO/RH(D): O POS
Antibody Screen: NEGATIVE

## 2019-10-26 LAB — SARS CORONAVIRUS 2 (TAT 6-24 HRS): SARS Coronavirus 2: NEGATIVE

## 2019-10-26 LAB — PROTIME-INR
INR: 1.1 (ref 0.8–1.2)
Prothrombin Time: 13.3 seconds (ref 11.4–15.2)

## 2019-10-26 LAB — GLUCOSE, CAPILLARY: Glucose-Capillary: 132 mg/dL — ABNORMAL HIGH (ref 70–99)

## 2019-10-26 NOTE — Progress Notes (Addendum)
PCP:  [name], MD Cardiologist:  Minus Breeding, MD  EKG:  10/07/19 CXR:  10/26/19 ECHO:  Denies Stress Test:  10/21/19 Cardiac Cath:  Denies  Fasting Blood Sugar-  Patient does not check blood sugars.  Glucose was 132 at PAT visit.   Checks Blood Sugar__0_ times a day  Covid test 10/26/19  Patient has foley cath with leg bag which a sample was taken for a U/A.   Anesthesia Review:  Yes, cardiac clearance in with stress test.    Patient denies shortness of breath, fever, cough, and chest pain at PAT appointment.  Patient verbalized understanding of instructions provided today at the PAT appointment.  Patient asked to review instructions at home and day of surgery.

## 2019-10-27 ENCOUNTER — Telehealth: Payer: Self-pay

## 2019-10-27 DIAGNOSIS — N3000 Acute cystitis without hematuria: Secondary | ICD-10-CM

## 2019-10-27 MED ORDER — CIPROFLOXACIN HCL 500 MG PO TABS: 500.0000 mg | ORAL_TABLET | Freq: Two times a day (BID) | ORAL | 0 refills | Status: DC

## 2019-10-27 NOTE — Anesthesia Preprocedure Evaluation (Addendum)
Anesthesia Evaluation  Patient identified by MRN, date of birth, ID band Patient awake    Reviewed: Allergy & Precautions, NPO status , Patient's Chart, lab work & pertinent test results  History of Anesthesia Complications (+) history of anesthetic complications (Urinary retention)  Airway Mallampati: II  TM Distance: >3 FB Neck ROM: Full    Dental  (+) Dental Advisory Given, Edentulous Lower, Edentulous Upper   Pulmonary COPD,  COPD inhaler, Current SmokerPatient did not abstain from smoking.,  LUL lung nodule   Pulmonary exam normal breath sounds clear to auscultation       Cardiovascular hypertension, Pt. on medications (-) angina+ Peripheral Vascular Disease  (-) CAD and (-) Past MI Normal cardiovascular exam Rhythm:Regular Rate:Normal     Neuro/Psych  Neuromuscular disease negative psych ROS   GI/Hepatic negative GI ROS, Neg liver ROS,   Endo/Other  diabetes, Type 2, Oral Hypoglycemic Agents  Renal/GU Renal InsufficiencyRenal disease     Musculoskeletal negative musculoskeletal ROS (+)   Abdominal   Peds  Hematology negative hematology ROS (+)   Anesthesia Other Findings Day of surgery medications reviewed with the patient.  Reproductive/Obstetrics                           Anesthesia Physical Anesthesia Plan  ASA: III  Anesthesia Plan: General   Post-op Pain Management:    Induction: Intravenous  PONV Risk Score and Plan: 2 and Dexamethasone, Ondansetron and Midazolam  Airway Management Planned: Double Lumen EBT  Additional Equipment: Arterial line, CVP and Ultrasound Guidance Line Placement  Intra-op Plan:   Post-operative Plan: Extubation in OR  Informed Consent: I have reviewed the patients History and Physical, chart, labs and discussed the procedure including the risks, benefits and alternatives for the proposed anesthesia with the patient or authorized  representative who has indicated his/her understanding and acceptance.     Dental advisory given  Plan Discussed with: CRNA  Anesthesia Plan Comments: (Hx of PAD s/p status post aortobifemoral bypass graft using a 16 x 8 bifurcated dacryon graft on 12/29/2014.  Recently found to have intensely hypermetabolic (max SUV 55.7) irregular 2.8 x 2.5 cm solid pulmonary nodule in the anteromedial left upper lobe, primary bronchogenic carcinoma on navigation bronchoscopy. Recent bronchoscopy 09/06/19 with post bronch pneumothorax.  He did well with re-expansion of lung after tube thoracostomy. Pneumothorax stayed resolved after chest tube clamping trial so it was removed and patient was sent home with follow-up with Dr. Lamonte Sakai.  Dr. Servando Snare referred pt to Dr. Percival Spanish for preop cardiac eval. Per note 10/07/19, "PREOP CLEARANCE: The patient has a low functional status. He is going for a moderate to high risk surgical procedure.  He has a known peripheral vascular disease and coronary calcification noted.  Given all of this further preoperative testing is indicated.  He would be able to walk on treadmill. Therefore, he will need a Lexiscan Myoview." Myoview was done 10/21/19 and Dr. Warren Lacy commented on result, "No evidence of ischemia. Given this the patient has no contraindication to surgery. Please send to the requesting surgeon for clearance."  Hx of marked prostatomegaly with Chronic indwelling Foley for urinary bladder obstruction  Stage 4 very severe COPD by GOLD classification followed by Dr. Lamonte Sakai. Per last note 09/27/19, "Stable symptoms.  Tolerating Stiolto.  Feels that he benefits significantly from it."  Preop labs reviewed, unremarkable. UA positive for leukocyte and nitrite. Called to Dr. Everrett Coombe office.   EKG 10/07/19: Sinus rhythm with 1st  degree AV block. Rate 86. Anterior infarct, age undetermined.   PFTs 08/08/19: FVC-%Pred-Pre Latest Units: % 66 FEV1-%Pred-Pre Latest Units:  % 47 FEV1FVC-%Pred-Pre Latest Units: % 72 DLCO unc % pred Latest Units: % 64   CHEST - 2 VIEW 10/26/19: COMPARISON:  Chest x-ray 09/07/2019.  FINDINGS: Lung volumes are increased with emphysematous changes. No consolidative airspace disease. No pleural effusions. No pneumothorax. Nodularity in the medial aspect of the lingula. Pulmonary vasculature and the cardiomediastinal silhouette are within normal limits. Aortic atherosclerosis.  IMPRESSION: 1. Nodularity noted in the medial aspect of the lingula, better demonstrated on prior chest CT 09/01/2019. 2. Emphysema. 3. Aortic atherosclerosis.  Nuclear stress 10/21/19: There was no ST segment deviation noted during stress. Nuclear stress EF: 75%. The left ventricular ejection fraction is hyperdynamic (>65%). The study is normal. This is a low risk study. )     Anesthesia Quick Evaluation

## 2019-10-27 NOTE — Telephone Encounter (Signed)
RX for Cipro 500 mg 1 po BID #2, no refills called to [facility]/ patient is aware

## 2019-10-27 NOTE — Progress Notes (Addendum)
Anesthesia Chart Review:  Hx of PAD s/p status post aortobifemoral bypass graft using a 16 x 8 bifurcated dacryon graft on 12/29/2014.  Recently found to have intensely hypermetabolic (max SUV 27.0) irregular 2.8 x 2.5 cm solid pulmonary nodule in the anteromedial left upper lobe, primary bronchogenic carcinoma on navigation bronchoscopy. Recent bronchoscopy 09/06/19 with post bronch pneumothorax.  He did well with re-expansion of lung after tube thoracostomy. Pneumothorax stayed resolved after chest tube clamping trial so it was removed and patient was sent home with follow-up with Dr. Lamonte Sakai.  Dr. Servando Snare referred pt to Dr. Percival Spanish for preop cardiac eval. Per note 10/07/19, "PREOP CLEARANCE: The patient has a low functional status. He is going for a moderate to high risk surgical procedure.  He has a known peripheral vascular disease and coronary calcification noted.  Given all of this further preoperative testing is indicated.  He would be able to walk on treadmill. Therefore, he will need a Lexiscan Myoview." Myoview was done 10/21/19 and Dr. Warren Lacy commented on result, "No evidence of ischemia. Given this the patient has no contraindication to surgery. Please send to the requesting surgeon for clearance."  Hx of marked prostatomegaly with Chronic indwelling Foley for urinary bladder obstruction  Stage 4 very severe COPD by GOLD classification followed by Dr. Lamonte Sakai. Per last note 09/27/19, "Stable symptoms.  Tolerating Stiolto.  Feels that he benefits significantly from it."  Preop labs reviewed, unremarkable. UA positive for leukocyte and nitrite. Called to Dr. Everrett Coombe office.   EKG 10/07/19: Sinus rhythm with 1st degree AV block. Rate 86. Anterior infarct, age undetermined.   PFTs 08/08/19: FVC-%Pred-Pre Latest Units: % 66  FEV1-%Pred-Pre Latest Units: % 47  FEV1FVC-%Pred-Pre Latest Units: % 72  DLCO unc % pred Latest Units: % 64    CHEST - 2 VIEW 10/26/19: COMPARISON:  Chest x-ray  09/07/2019.  FINDINGS: Lung volumes are increased with emphysematous changes. No consolidative airspace disease. No pleural effusions. No pneumothorax. Nodularity in the medial aspect of the lingula. Pulmonary vasculature and the cardiomediastinal silhouette are within normal limits. Aortic atherosclerosis.  IMPRESSION: 1. Nodularity noted in the medial aspect of the lingula, better demonstrated on prior chest CT 09/01/2019. 2. Emphysema. 3. Aortic atherosclerosis.  Nuclear stress 10/21/19:  There was no ST segment deviation noted during stress.  Nuclear stress EF: 75%.  The left ventricular ejection fraction is hyperdynamic (>65%).  The study is normal.  This is a low risk study.   Wynonia Musty [facility] Short Stay Center/Anesthesiology Phone [phone] 10/27/2019 9:58 AM

## 2019-10-28 ENCOUNTER — Inpatient Hospital Stay (HOSPITAL_COMMUNITY): Payer: Medicare Other

## 2019-10-28 ENCOUNTER — Inpatient Hospital Stay (HOSPITAL_COMMUNITY)
Admission: RE | Admit: 2019-10-28 | Discharge: 2019-11-05 | DRG: 164 | Disposition: A | Discharge status: Home / Self Care | Payer: Medicare Other | Source: Ambulatory Visit | Attending: Cardiothoracic Surgery | Admitting: Cardiothoracic Surgery

## 2019-10-28 ENCOUNTER — Other Ambulatory Visit: Payer: Self-pay

## 2019-10-28 ENCOUNTER — Encounter (HOSPITAL_COMMUNITY)
Admission: RE | Disposition: A | Discharge status: Home / Self Care | Payer: Self-pay | Source: Ambulatory Visit | Attending: Cardiothoracic Surgery

## 2019-10-28 ENCOUNTER — Encounter (HOSPITAL_COMMUNITY): Payer: Self-pay | Admitting: Cardiothoracic Surgery

## 2019-10-28 ENCOUNTER — Inpatient Hospital Stay (HOSPITAL_COMMUNITY): Payer: Medicare Other | Admitting: Physician Assistant

## 2019-10-28 ENCOUNTER — Inpatient Hospital Stay (HOSPITAL_COMMUNITY): Payer: Medicare Other | Admitting: Anesthesiology

## 2019-10-28 DIAGNOSIS — E1142 Type 2 diabetes mellitus with diabetic polyneuropathy: Secondary | ICD-10-CM | POA: Diagnosis present

## 2019-10-28 DIAGNOSIS — Z87442 Personal history of urinary calculi: Secondary | ICD-10-CM

## 2019-10-28 DIAGNOSIS — J9382 Other air leak: Secondary | ICD-10-CM | POA: Diagnosis not present

## 2019-10-28 DIAGNOSIS — E785 Hyperlipidemia, unspecified: Secondary | ICD-10-CM | POA: Diagnosis present

## 2019-10-28 DIAGNOSIS — Z85820 Personal history of malignant melanoma of skin: Secondary | ICD-10-CM | POA: Diagnosis not present

## 2019-10-28 DIAGNOSIS — I129 Hypertensive chronic kidney disease with stage 1 through stage 4 chronic kidney disease, or unspecified chronic kidney disease: Secondary | ICD-10-CM | POA: Diagnosis present

## 2019-10-28 DIAGNOSIS — N189 Chronic kidney disease, unspecified: Secondary | ICD-10-CM | POA: Diagnosis present

## 2019-10-28 DIAGNOSIS — C771 Secondary and unspecified malignant neoplasm of intrathoracic lymph nodes: Secondary | ICD-10-CM | POA: Diagnosis not present

## 2019-10-28 DIAGNOSIS — I10 Essential (primary) hypertension: Secondary | ICD-10-CM | POA: Diagnosis not present

## 2019-10-28 DIAGNOSIS — Z79899 Other long term (current) drug therapy: Secondary | ICD-10-CM

## 2019-10-28 DIAGNOSIS — R911 Solitary pulmonary nodule: Secondary | ICD-10-CM

## 2019-10-28 DIAGNOSIS — Z794 Long term (current) use of insulin: Secondary | ICD-10-CM | POA: Diagnosis not present

## 2019-10-28 DIAGNOSIS — Z885 Allergy status to narcotic agent status: Secondary | ICD-10-CM | POA: Diagnosis not present

## 2019-10-28 DIAGNOSIS — R338 Other retention of urine: Secondary | ICD-10-CM | POA: Diagnosis present

## 2019-10-28 DIAGNOSIS — F17210 Nicotine dependence, cigarettes, uncomplicated: Secondary | ICD-10-CM | POA: Diagnosis present

## 2019-10-28 DIAGNOSIS — Z8719 Personal history of other diseases of the digestive system: Secondary | ICD-10-CM | POA: Diagnosis not present

## 2019-10-28 DIAGNOSIS — E611 Iron deficiency: Secondary | ICD-10-CM | POA: Diagnosis present

## 2019-10-28 DIAGNOSIS — Z4682 Encounter for fitting and adjustment of non-vascular catheter: Secondary | ICD-10-CM | POA: Diagnosis not present

## 2019-10-28 DIAGNOSIS — E1122 Type 2 diabetes mellitus with diabetic chronic kidney disease: Secondary | ICD-10-CM | POA: Diagnosis present

## 2019-10-28 DIAGNOSIS — J449 Chronic obstructive pulmonary disease, unspecified: Secondary | ICD-10-CM | POA: Diagnosis not present

## 2019-10-28 DIAGNOSIS — Z902 Acquired absence of lung [part of]: Secondary | ICD-10-CM

## 2019-10-28 DIAGNOSIS — C3410 Malignant neoplasm of upper lobe, unspecified bronchus or lung: Secondary | ICD-10-CM | POA: Diagnosis present

## 2019-10-28 DIAGNOSIS — N320 Bladder-neck obstruction: Secondary | ICD-10-CM | POA: Diagnosis present

## 2019-10-28 DIAGNOSIS — K8020 Calculus of gallbladder without cholecystitis without obstruction: Secondary | ICD-10-CM | POA: Diagnosis present

## 2019-10-28 DIAGNOSIS — Z9689 Presence of other specified functional implants: Secondary | ICD-10-CM

## 2019-10-28 DIAGNOSIS — J439 Emphysema, unspecified: Secondary | ICD-10-CM | POA: Diagnosis not present

## 2019-10-28 DIAGNOSIS — E1151 Type 2 diabetes mellitus with diabetic peripheral angiopathy without gangrene: Secondary | ICD-10-CM | POA: Diagnosis present

## 2019-10-28 DIAGNOSIS — J9811 Atelectasis: Secondary | ICD-10-CM | POA: Diagnosis not present

## 2019-10-28 DIAGNOSIS — C3412 Malignant neoplasm of upper lobe, left bronchus or lung: Principal | ICD-10-CM | POA: Diagnosis present

## 2019-10-28 DIAGNOSIS — T797XXA Traumatic subcutaneous emphysema, initial encounter: Secondary | ICD-10-CM

## 2019-10-28 DIAGNOSIS — Z7982 Long term (current) use of aspirin: Secondary | ICD-10-CM | POA: Diagnosis not present

## 2019-10-28 DIAGNOSIS — Z20822 Contact with and (suspected) exposure to covid-19: Secondary | ICD-10-CM | POA: Diagnosis present

## 2019-10-28 DIAGNOSIS — Z7901 Long term (current) use of anticoagulants: Secondary | ICD-10-CM | POA: Diagnosis not present

## 2019-10-28 DIAGNOSIS — Z09 Encounter for follow-up examination after completed treatment for conditions other than malignant neoplasm: Secondary | ICD-10-CM

## 2019-10-28 DIAGNOSIS — J309 Allergic rhinitis, unspecified: Secondary | ICD-10-CM | POA: Diagnosis not present

## 2019-10-28 DIAGNOSIS — J939 Pneumothorax, unspecified: Secondary | ICD-10-CM | POA: Diagnosis not present

## 2019-10-28 DIAGNOSIS — J984 Other disorders of lung: Secondary | ICD-10-CM | POA: Diagnosis not present

## 2019-10-28 DIAGNOSIS — Z419 Encounter for procedure for purposes other than remedying health state, unspecified: Secondary | ICD-10-CM

## 2019-10-28 HISTORY — PX: VIDEO BRONCHOSCOPY WITH ENDOBRONCHIAL NAVIGATION: SHX6175

## 2019-10-28 HISTORY — PX: NODE DISSECTION: SHX5269

## 2019-10-28 HISTORY — PX: VIDEO BRONCHOSCOPY: SHX5072

## 2019-10-28 HISTORY — PX: INTERCOSTAL NERVE BLOCK: SHX5021

## 2019-10-28 HISTORY — PX: WEDGE RESECTION: SHX5070

## 2019-10-28 LAB — GLUCOSE, CAPILLARY
Glucose-Capillary: 101 mg/dL — ABNORMAL HIGH (ref 70–99)
Glucose-Capillary: 163 mg/dL — ABNORMAL HIGH (ref 70–99)
Glucose-Capillary: 219 mg/dL — ABNORMAL HIGH (ref 70–99)
Glucose-Capillary: 199 mg/dL — ABNORMAL HIGH (ref 70–99)

## 2019-10-28 LAB — POCT I-STAT 7, (LYTES, BLD GAS, ICA,H+H)
Acid-base deficit: 1 mmol/L (ref 0.0–2.0)
Bicarbonate: 30.8 mmol/L — ABNORMAL HIGH (ref 20.0–28.0)
Calcium, Ion: 1.34 mmol/L (ref 1.15–1.40)
HCT: 45 % (ref 39.0–52.0)
Hemoglobin: 15.3 g/dL (ref 13.0–17.0)
O2 Saturation: 96 %
Potassium: 4.5 mmol/L (ref 3.5–5.1)
Sodium: 135 mmol/L (ref 135–145)
TCO2: 33 mmol/L — ABNORMAL HIGH (ref 22–32)
pCO2 arterial: 85.1 mmHg (ref 32.0–48.0)
pH, Arterial: 7.167 — CL (ref 7.350–7.450)
pO2, Arterial: 105 mmHg (ref 83.0–108.0)

## 2019-10-28 SURGERY — VIDEO BRONCHOSCOPY WITH ENDOBRONCHIAL NAVIGATION
Anesthesia: General | Site: Chest

## 2019-10-28 MED ORDER — ASPIRIN EC 81 MG PO TBEC
81.0000 mg | DELAYED_RELEASE_TABLET | Freq: Every day | ORAL | Status: DC
  Administered 2019-10-29 – 2019-11-05 (×8): 81 mg via ORAL
  Filled 2019-10-28 (×8): qty 1

## 2019-10-28 MED ORDER — FLUTICASONE PROPIONATE 50 MCG/ACT NA SUSP
2.0000 | Freq: Every day | NASAL | Status: DC
  Administered 2019-10-30 – 2019-11-05 (×7): 2 via NASAL
  Filled 2019-10-28: qty 16

## 2019-10-28 MED ORDER — AMLODIPINE BESYLATE 10 MG PO TABS
10.0000 mg | ORAL_TABLET | Freq: Every day | ORAL | Status: DC
  Administered 2019-10-29 – 2019-11-05 (×8): 10 mg via ORAL
  Filled 2019-10-28 (×8): qty 1

## 2019-10-28 MED ORDER — LACTATED RINGERS IV SOLN
INTRAVENOUS | Status: DC | PRN
Start: 2019-10-28 — End: 2019-10-28

## 2019-10-28 MED ORDER — ARFORMOTEROL TARTRATE 15 MCG/2ML IN NEBU
15.0000 ug | INHALATION_SOLUTION | Freq: Two times a day (BID) | RESPIRATORY_TRACT | Status: DC
  Administered 2019-10-28 – 2019-11-05 (×15): 15 ug via RESPIRATORY_TRACT
  Filled 2019-10-28 (×15): qty 2

## 2019-10-28 MED ORDER — POTASSIUM CITRATE ER 10 MEQ (1080 MG) PO TBCR
10.0000 meq | EXTENDED_RELEASE_TABLET | Freq: Two times a day (BID) | ORAL | Status: DC
  Administered 2019-10-29 – 2019-11-05 (×15): 10 meq via ORAL
  Filled 2019-10-28 (×17): qty 1

## 2019-10-28 MED ORDER — ACETAMINOPHEN 500 MG PO TABS
1000.0000 mg | ORAL_TABLET | Freq: Four times a day (QID) | ORAL | Status: AC
  Administered 2019-10-28 – 2019-11-02 (×15): 1000 mg via ORAL
  Filled 2019-10-28 (×17): qty 2

## 2019-10-28 MED ORDER — FENTANYL CITRATE (PF) 250 MCG/5ML IJ SOLN
INTRAMUSCULAR | Status: AC
  Filled 2019-10-28: qty 5

## 2019-10-28 MED ORDER — DEXAMETHASONE SODIUM PHOSPHATE 10 MG/ML IJ SOLN
INTRAMUSCULAR | Status: AC
  Filled 2019-10-28: qty 1

## 2019-10-28 MED ORDER — FENTANYL CITRATE (PF) 100 MCG/2ML IJ SOLN
INTRAMUSCULAR | Status: AC
  Filled 2019-10-28: qty 2

## 2019-10-28 MED ORDER — ONDANSETRON HCL 4 MG/2ML IJ SOLN
INTRAMUSCULAR | Status: AC
  Filled 2019-10-28: qty 2

## 2019-10-28 MED ORDER — CHLORHEXIDINE GLUCONATE CLOTH 2 % EX PADS
6.0000 | MEDICATED_PAD | Freq: Every day | CUTANEOUS | Status: DC
  Administered 2019-10-30 – 2019-11-05 (×7): 6 via TOPICAL

## 2019-10-28 MED ORDER — METOCLOPRAMIDE HCL 5 MG/ML IJ SOLN
10.0000 mg | Freq: Four times a day (QID) | INTRAMUSCULAR | Status: AC
  Administered 2019-10-28 – 2019-10-29 (×3): 10 mg via INTRAVENOUS
  Filled 2019-10-28 (×4): qty 2

## 2019-10-28 MED ORDER — LORATADINE 10 MG PO TABS
10.0000 mg | ORAL_TABLET | Freq: Every day | ORAL | Status: DC
  Administered 2019-10-29 – 2019-11-05 (×6): 10 mg via ORAL
  Filled 2019-10-28 (×8): qty 1

## 2019-10-28 MED ORDER — FENTANYL CITRATE (PF) 100 MCG/2ML IJ SOLN
INTRAMUSCULAR | Status: DC | PRN
  Administered 2019-10-28: 100 ug via INTRAVENOUS
  Administered 2019-10-28 (×3): 50 ug via INTRAVENOUS

## 2019-10-28 MED ORDER — ALBUTEROL SULFATE HFA 108 (90 BASE) MCG/ACT IN AERS
INHALATION_SPRAY | RESPIRATORY_TRACT | Status: DC | PRN
  Administered 2019-10-28 (×2): 4 via RESPIRATORY_TRACT

## 2019-10-28 MED ORDER — METHYLENE BLUE 0.5 % INJ SOLN
INTRAVENOUS | Status: DC | PRN
  Administered 2019-10-28: 1.5 mL

## 2019-10-28 MED ORDER — TAMSULOSIN HCL 0.4 MG PO CAPS
0.4000 mg | ORAL_CAPSULE | Freq: Two times a day (BID) | ORAL | Status: DC
  Administered 2019-10-28 – 2019-11-05 (×16): 0.4 mg via ORAL
  Filled 2019-10-28 (×16): qty 1

## 2019-10-28 MED ORDER — VANCOMYCIN HCL IN DEXTROSE 1-5 GM/200ML-% IV SOLN
INTRAVENOUS | Status: AC
  Filled 2019-10-28: qty 200

## 2019-10-28 MED ORDER — ALBUTEROL SULFATE (2.5 MG/3ML) 0.083% IN NEBU: 3.0000 mL | INHALATION_SOLUTION | Freq: Four times a day (QID) | RESPIRATORY_TRACT | Status: DC | PRN

## 2019-10-28 MED ORDER — FENTANYL CITRATE (PF) 100 MCG/2ML IJ SOLN
25.0000 ug | INTRAMUSCULAR | Status: DC | PRN
  Administered 2019-10-28: 50 ug via INTRAVENOUS

## 2019-10-28 MED ORDER — BUPIVACAINE LIPOSOME 1.3 % IJ SUSP
20.0000 mL | Freq: Once | INTRAMUSCULAR | Status: DC
  Filled 2019-10-28: qty 20

## 2019-10-28 MED ORDER — ACETAMINOPHEN 500 MG PO TABS
ORAL_TABLET | ORAL | Status: AC
  Administered 2019-10-28: 1000 mg
  Filled 2019-10-28: qty 2

## 2019-10-28 MED ORDER — VANCOMYCIN HCL IN DEXTROSE 1-5 GM/200ML-% IV SOLN
1000.0000 mg | Freq: Two times a day (BID) | INTRAVENOUS | Status: AC
  Administered 2019-10-28: 1000 mg via INTRAVENOUS
  Filled 2019-10-28: qty 200

## 2019-10-28 MED ORDER — INDAPAMIDE 1.25 MG PO TABS
1.2500 mg | ORAL_TABLET | Freq: Every day | ORAL | Status: DC
  Administered 2019-10-29 – 2019-11-05 (×8): 1.25 mg via ORAL
  Filled 2019-10-28 (×9): qty 1

## 2019-10-28 MED ORDER — MIDAZOLAM HCL 2 MG/2ML IJ SOLN
INTRAMUSCULAR | Status: AC
  Administered 2019-10-28: 1 mg via INTRAVENOUS
  Filled 2019-10-28: qty 2

## 2019-10-28 MED ORDER — ALBUMIN HUMAN 5 % IV SOLN: INTRAVENOUS | Status: DC | PRN

## 2019-10-28 MED ORDER — PHENYLEPHRINE HCL-NACL 10-0.9 MG/250ML-% IV SOLN
INTRAVENOUS | Status: DC | PRN
  Administered 2019-10-28: 25 ug/min via INTRAVENOUS

## 2019-10-28 MED ORDER — SODIUM CHLORIDE 0.9 % IV SOLN: INTRAVENOUS | Status: DC | PRN

## 2019-10-28 MED ORDER — SODIUM CHLORIDE FLUSH 0.9 % IV SOLN
INTRAVENOUS | Status: DC | PRN
  Administered 2019-10-28: 100 mL

## 2019-10-28 MED ORDER — MAGNESIUM OXIDE 400 (241.3 MG) MG PO TABS
200.0000 mg | ORAL_TABLET | Freq: Every day | ORAL | Status: DC
  Administered 2019-10-29 – 2019-11-05 (×8): 200 mg via ORAL
  Filled 2019-10-28 (×8): qty 1

## 2019-10-28 MED ORDER — LIDOCAINE HCL (CARDIAC) PF 100 MG/5ML IV SOSY
PREFILLED_SYRINGE | INTRAVENOUS | Status: DC | PRN
  Administered 2019-10-28: 80 mg via INTRAVENOUS

## 2019-10-28 MED ORDER — PROPOFOL 10 MG/ML IV BOLUS
INTRAVENOUS | Status: DC | PRN
  Administered 2019-10-28: 150 mg via INTRAVENOUS

## 2019-10-28 MED ORDER — OXYCODONE HCL 5 MG PO TABS: 5.0000 mg | ORAL_TABLET | ORAL | Status: DC | PRN

## 2019-10-28 MED ORDER — ROCURONIUM BROMIDE 100 MG/10ML IV SOLN
INTRAVENOUS | Status: DC | PRN
  Administered 2019-10-28: 25 mg via INTRAVENOUS
  Administered 2019-10-28: 60 mg via INTRAVENOUS
  Administered 2019-10-28: 40 mg via INTRAVENOUS
  Administered 2019-10-28: 10 mg via INTRAVENOUS

## 2019-10-28 MED ORDER — IPRATROPIUM-ALBUTEROL 0.5-2.5 (3) MG/3ML IN SOLN
3.0000 mL | Freq: Four times a day (QID) | RESPIRATORY_TRACT | Status: DC
  Administered 2019-10-28: 3 mL via RESPIRATORY_TRACT
  Filled 2019-10-28: qty 3

## 2019-10-28 MED ORDER — HEMOSTATIC AGENTS (NO CHARGE) OPTIME
TOPICAL | Status: DC | PRN
  Administered 2019-10-28: 2 via TOPICAL

## 2019-10-28 MED ORDER — ROCURONIUM BROMIDE 10 MG/ML (PF) SYRINGE
PREFILLED_SYRINGE | INTRAVENOUS | Status: AC
  Filled 2019-10-28: qty 40

## 2019-10-28 MED ORDER — EZETIMIBE 10 MG PO TABS
5.0000 mg | ORAL_TABLET | Freq: Every day | ORAL | Status: DC
  Administered 2019-10-29 – 2019-11-05 (×8): 5 mg via ORAL
  Filled 2019-10-28 (×9): qty 1

## 2019-10-28 MED ORDER — FENTANYL CITRATE (PF) 100 MCG/2ML IJ SOLN
50.0000 ug | Freq: Once | INTRAMUSCULAR | Status: AC
  Administered 2019-10-28: 50 ug via INTRAVENOUS

## 2019-10-28 MED ORDER — SENNOSIDES-DOCUSATE SODIUM 8.6-50 MG PO TABS
1.0000 | ORAL_TABLET | Freq: Every day | ORAL | Status: DC
  Administered 2019-10-31: 1 via ORAL
  Filled 2019-10-28 (×2): qty 1

## 2019-10-28 MED ORDER — INSULIN ASPART 100 UNIT/ML ~~LOC~~ SOLN: 0.0000 [IU] | SUBCUTANEOUS | Status: DC

## 2019-10-28 MED ORDER — ONDANSETRON HCL 4 MG/2ML IJ SOLN: 4.0000 mg | Freq: Four times a day (QID) | INTRAMUSCULAR | Status: DC | PRN

## 2019-10-28 MED ORDER — CIPROFLOXACIN HCL 500 MG PO TABS
500.0000 mg | ORAL_TABLET | Freq: Two times a day (BID) | ORAL | Status: DC
  Administered 2019-10-28: 500 mg via ORAL
  Filled 2019-10-28 (×2): qty 1

## 2019-10-28 MED ORDER — SODIUM CHLORIDE 0.45 % IV SOLN: INTRAVENOUS | Status: DC

## 2019-10-28 MED ORDER — ONDANSETRON HCL 4 MG/2ML IJ SOLN: INTRAMUSCULAR | Status: DC | PRN

## 2019-10-28 MED ORDER — UMECLIDINIUM BROMIDE 62.5 MCG/INH IN AEPB
1.0000 | INHALATION_SPRAY | Freq: Every day | RESPIRATORY_TRACT | Status: DC
  Administered 2019-10-29 – 2019-11-05 (×8): 1 via RESPIRATORY_TRACT
  Filled 2019-10-28 (×2): qty 7

## 2019-10-28 MED ORDER — ENOXAPARIN SODIUM 40 MG/0.4ML ~~LOC~~ SOLN
40.0000 mg | Freq: Every day | SUBCUTANEOUS | Status: DC
  Filled 2019-10-28 (×6): qty 0.4

## 2019-10-28 MED ORDER — LIDOCAINE 2% (20 MG/ML) 5 ML SYRINGE
INTRAMUSCULAR | Status: AC
  Filled 2019-10-28: qty 5

## 2019-10-28 MED ORDER — BUPIVACAINE HCL (PF) 0.5 % IJ SOLN
INTRAMUSCULAR | Status: AC
  Filled 2019-10-28: qty 30

## 2019-10-28 MED ORDER — SUGAMMADEX SODIUM 200 MG/2ML IV SOLN
INTRAVENOUS | Status: DC | PRN
  Administered 2019-10-28: 180 mg via INTRAVENOUS

## 2019-10-28 MED ORDER — DEXAMETHASONE SODIUM PHOSPHATE 4 MG/ML IJ SOLN
INTRAMUSCULAR | Status: DC | PRN
  Administered 2019-10-28: 10 mg via INTRAVENOUS

## 2019-10-28 MED ORDER — ONDANSETRON HCL 4 MG/2ML IJ SOLN: 4.0000 mg | Freq: Once | INTRAMUSCULAR | Status: DC | PRN

## 2019-10-28 MED ORDER — VANCOMYCIN HCL IN DEXTROSE 1-5 GM/200ML-% IV SOLN
1000.0000 mg | INTRAVENOUS | Status: AC
  Administered 2019-10-28: 1000 mg via INTRAVENOUS

## 2019-10-28 MED ORDER — 0.9 % SODIUM CHLORIDE (POUR BTL) OPTIME
TOPICAL | Status: DC | PRN
  Administered 2019-10-28: 3000 mL

## 2019-10-28 MED ORDER — BISACODYL 5 MG PO TBEC
10.0000 mg | DELAYED_RELEASE_TABLET | Freq: Every day | ORAL | Status: DC
  Filled 2019-10-28 (×7): qty 2

## 2019-10-28 MED ORDER — PROPOFOL 10 MG/ML IV BOLUS
INTRAVENOUS | Status: AC
  Filled 2019-10-28: qty 20

## 2019-10-28 MED ORDER — SODIUM CHLORIDE 0.9 % IR SOLN
Status: DC | PRN
  Administered 2019-10-28: 1000 mL

## 2019-10-28 MED ORDER — ACETAMINOPHEN 160 MG/5ML PO SOLN: 1000.0000 mg | Freq: Four times a day (QID) | ORAL | Status: AC

## 2019-10-28 MED ORDER — FENTANYL CITRATE (PF) 100 MCG/2ML IJ SOLN: 25.0000 ug | INTRAMUSCULAR | Status: DC | PRN

## 2019-10-28 MED ORDER — ONDANSETRON HCL 4 MG/2ML IJ SOLN
INTRAMUSCULAR | Status: DC | PRN
  Administered 2019-10-28: 4 mg via INTRAVENOUS

## 2019-10-28 MED ORDER — TRAMADOL HCL 50 MG PO TABS
50.0000 mg | ORAL_TABLET | Freq: Four times a day (QID) | ORAL | Status: DC | PRN
  Administered 2019-10-30: 100 mg via ORAL
  Filled 2019-10-28: qty 1
  Filled 2019-10-28: qty 2

## 2019-10-28 MED ORDER — MIDAZOLAM HCL 2 MG/2ML IJ SOLN: 1.0000 mg | Freq: Once | INTRAMUSCULAR | Status: AC

## 2019-10-28 MED ORDER — PHENYLEPHRINE 40 MCG/ML (10ML) SYRINGE FOR IV PUSH (FOR BLOOD PRESSURE SUPPORT)
PREFILLED_SYRINGE | INTRAVENOUS | Status: AC
  Filled 2019-10-28: qty 10

## 2019-10-28 MED ORDER — ACETAMINOPHEN 500 MG PO TABS: 1000.0000 mg | ORAL_TABLET | Freq: Once | ORAL | Status: DC

## 2019-10-28 MED ORDER — ATORVASTATIN CALCIUM 40 MG PO TABS
40.0000 mg | ORAL_TABLET | Freq: Every day | ORAL | Status: DC
  Administered 2019-10-29 – 2019-11-05 (×8): 40 mg via ORAL
  Filled 2019-10-28 (×8): qty 1

## 2019-10-28 MED ORDER — LACTATED RINGERS IV SOLN: INTRAVENOUS | Status: DC | PRN

## 2019-10-28 MED ORDER — FINASTERIDE 5 MG PO TABS
5.0000 mg | ORAL_TABLET | Freq: Every day | ORAL | Status: DC
  Administered 2019-10-29 – 2019-11-05 (×8): 5 mg via ORAL
  Filled 2019-10-28 (×8): qty 1

## 2019-10-28 SURGICAL SUPPLY — 148 items
ADAPTER BRONCHOSCOPE OLYMPUS (ADAPTER) ×5 IMPLANT
ADAPTER VALVE BIOPSY EBUS (MISCELLANEOUS) IMPLANT
ADH SKN CLS APL DERMABOND .7 (GAUZE/BANDAGES/DRESSINGS) ×3
ADPR BSCP OLMPS EDG (ADAPTER) ×3
ADPTR VALVE BIOPSY EBUS (MISCELLANEOUS)
ANCHOR CATH FOLEY SECURE (MISCELLANEOUS) ×5 IMPLANT
APL SRG 22X2 LUM MLBL SLNT (VASCULAR PRODUCTS)
APPLICATOR TIP EXT COSEAL (VASCULAR PRODUCTS) IMPLANT
BAG TISS RTRVL C300 12X14 (MISCELLANEOUS) ×3
BLADE CLIPPER SURG (BLADE) ×3 IMPLANT
BNDG COHESIVE 6X5 TAN STRL LF (GAUZE/BANDAGES/DRESSINGS) ×5 IMPLANT
BRUSH BIOPSY BRONCH 10 SDTNB (MISCELLANEOUS) IMPLANT
BRUSH BIOPSY BRONCH 10MM SDTNB (MISCELLANEOUS)
BRUSH SUPERTRAX BIOPSY (INSTRUMENTS) IMPLANT
BRUSH SUPERTRAX NDL-TIP CYTO (INSTRUMENTS) IMPLANT
CANISTER SUCT 3000ML PPV (MISCELLANEOUS) ×10 IMPLANT
CANNULA REDUC XI 12-8 STAPL (CANNULA) ×8
CANNULA REDUC XI 12-8MM STAPL (CANNULA) ×2
CANNULA REDUCER 12-8 DVNC XI (CANNULA) ×6 IMPLANT
CATH THORACIC 28FR (CATHETERS) IMPLANT
CATH THORACIC 36FR (CATHETERS) IMPLANT
CATH THORACIC 36FR RT ANG (CATHETERS) IMPLANT
CLIP VESOCCLUDE MED 6/CT (CLIP) IMPLANT
CNTNR URN SCR LID CUP LEK RST (MISCELLANEOUS) ×15 IMPLANT
CONN ST 1/4X3/8  BEN (MISCELLANEOUS)
CONN ST 1/4X3/8 BEN (MISCELLANEOUS) IMPLANT
CONN Y 3/8X3/8X3/8  BEN (MISCELLANEOUS)
CONN Y 3/8X3/8X3/8 BEN (MISCELLANEOUS) IMPLANT
CONT SPEC 4OZ STRL OR WHT (MISCELLANEOUS) ×35
COVER BACK TABLE 60X90IN (DRAPES) ×5 IMPLANT
DEFOGGER SCOPE WARMER CLEARIFY (MISCELLANEOUS) ×5 IMPLANT
DERMABOND ADVANCED (GAUZE/BANDAGES/DRESSINGS) ×2
DERMABOND ADVANCED .7 DNX12 (GAUZE/BANDAGES/DRESSINGS) ×6 IMPLANT
DISSECTOR BLUNT TIP ENDO 5MM (MISCELLANEOUS) IMPLANT
DRAIN CHANNEL 28F RND 3/8 FF (WOUND CARE) IMPLANT
DRAPE ARM DVNC X/XI (DISPOSABLE) ×12 IMPLANT
DRAPE COLUMN DVNC XI (DISPOSABLE) ×3 IMPLANT
DRAPE CV SPLIT W-CLR ANES SCRN (DRAPES) ×5 IMPLANT
DRAPE DA VINCI XI ARM (DISPOSABLE) ×20
DRAPE DA VINCI XI COLUMN (DISPOSABLE) ×5
DRAPE ORTHO SPLIT 77X108 STRL (DRAPES) ×5
DRAPE SURG ORHT 6 SPLT 77X108 (DRAPES) ×3 IMPLANT
DRAPE WARM FLUID 44X44 (DRAPES) ×5 IMPLANT
ELECT BLADE 4.0 EZ CLEAN MEGAD (MISCELLANEOUS) ×5
ELECT REM PT RETURN 9FT ADLT (ELECTROSURGICAL) ×5
ELECTRODE BLDE 4.0 EZ CLN MEGD (MISCELLANEOUS) IMPLANT
ELECTRODE REM PT RTRN 9FT ADLT (ELECTROSURGICAL) ×3 IMPLANT
FILTER SMOKE EVAC ULPA (FILTER) ×5 IMPLANT
FILTER STRAW FLUID ASPIR (MISCELLANEOUS) ×2 IMPLANT
FORCEPS BIOP SUPERTRX PREMAR (INSTRUMENTS) IMPLANT
GAUZE KITTNER 4X5 RF (MISCELLANEOUS) ×4 IMPLANT
GAUZE KITTNER 4X8 (MISCELLANEOUS) ×3 IMPLANT
GAUZE SPONGE 4X4 12PLY STRL (GAUZE/BANDAGES/DRESSINGS) ×5 IMPLANT
GLOVE BIO SURGEON STRL SZ 6.5 (GLOVE) ×9 IMPLANT
GLOVE BIO SURGEONS STRL SZ 6.5 (GLOVE) ×3
GLOVE BIOGEL PI IND STRL 7.5 (GLOVE) IMPLANT
GLOVE BIOGEL PI INDICATOR 7.5 (GLOVE) ×4
GLOVE SURG SS PI 7.5 STRL IVOR (GLOVE) ×6 IMPLANT
GOWN STRL REUS W/ TWL LRG LVL3 (GOWN DISPOSABLE) ×9 IMPLANT
GOWN STRL REUS W/TWL 2XL LVL3 (GOWN DISPOSABLE) ×5 IMPLANT
GOWN STRL REUS W/TWL LRG LVL3 (GOWN DISPOSABLE) ×15
HEMOSTAT SURGICEL 2X14 (HEMOSTASIS) ×13 IMPLANT
IRRIGATION STRYKERFLOW (MISCELLANEOUS) ×3 IMPLANT
IRRIGATOR STRYKERFLOW (MISCELLANEOUS) ×5
KIT BASIN OR (CUSTOM PROCEDURE TRAY) ×5 IMPLANT
KIT CLEAN ENDO COMPLIANCE (KITS) ×5 IMPLANT
KIT ILLUMISITE 180 PROCEDURE (KITS) IMPLANT
KIT ILLUMISITE 90 PROCEDURE (KITS) ×2 IMPLANT
KIT TURNOVER KIT B (KITS) ×5 IMPLANT
MARKER SKIN DUAL TIP RULER LAB (MISCELLANEOUS) ×5 IMPLANT
NDL SUPERTRX PREMARK BIOPSY (NEEDLE) IMPLANT
NEEDLE SUPERTRX PREMARK BIOPSY (NEEDLE) IMPLANT
NS IRRIG 1000ML POUR BTL (IV SOLUTION) ×9 IMPLANT
OBTURATOR OPTICAL STANDARD 8MM (TROCAR) ×5
OBTURATOR OPTICAL STND 8 DVNC (TROCAR) ×3
OBTURATOR OPTICALSTD 8 DVNC (TROCAR) IMPLANT
OIL SILICONE PENTAX (PARTS (SERVICE/REPAIRS)) ×5 IMPLANT
PACK CHEST (CUSTOM PROCEDURE TRAY) ×5 IMPLANT
PAD ARMBOARD 7.5X6 YLW CONV (MISCELLANEOUS) ×25 IMPLANT
PASSER SUT SWANSON 36MM LOOP (INSTRUMENTS) IMPLANT
PATCHES PATIENT (LABEL) ×15 IMPLANT
PENCIL SMOKE EVACUATOR (MISCELLANEOUS) ×5 IMPLANT
RELOAD STAPLE 45 3.5 BLU DVNC (STAPLE) IMPLANT
RELOAD STAPLE 45 4.3 GRN DVNC (STAPLE) IMPLANT
RELOAD STAPLER 3.5X45 BLU DVNC (STAPLE) ×24 IMPLANT
RELOAD STAPLER 4.3X45 GRN DVNC (STAPLE) ×3 IMPLANT
SEAL CANN UNIV 5-8 DVNC XI (MISCELLANEOUS) ×6 IMPLANT
SEAL XI 5MM-8MM UNIVERSAL (MISCELLANEOUS) ×10
SEALANT PROGEL (MISCELLANEOUS) IMPLANT
SEALANT SURG COSEAL 4ML (VASCULAR PRODUCTS) IMPLANT
SEALANT SURG COSEAL 8ML (VASCULAR PRODUCTS) IMPLANT
SEALER LIGASURE MARYLAND 30 (ELECTROSURGICAL) IMPLANT
SET TUBE SMOKE EVAC HIGH FLOW (TUBING) ×5 IMPLANT
SLEEVE SUCTION 125 (MISCELLANEOUS) ×5 IMPLANT
SOL ANTI FOG 6CC (MISCELLANEOUS) IMPLANT
SOLUTION ANTI FOG 6CC (MISCELLANEOUS)
SOLUTION ELECTROLUBE (MISCELLANEOUS) ×5 IMPLANT
STAPLER 45 DA VINCI SURE FORM (STAPLE) ×5
STAPLER 45 SUREFORM DVNC (STAPLE) IMPLANT
STAPLER CANNULA SEAL DVNC XI (STAPLE) ×6 IMPLANT
STAPLER CANNULA SEAL XI (STAPLE) ×10
STAPLER RELOAD 3.5X45 BLU DVNC (STAPLE) ×24
STAPLER RELOAD 3.5X45 BLUE (STAPLE) ×40
STAPLER RELOAD 4.3X45 GREEN (STAPLE) ×5
STAPLER RELOAD 4.3X45 GRN DVNC (STAPLE) ×3
STOPCOCK 4 WAY LG BORE MALE ST (IV SETS) ×5 IMPLANT
SUT PROLENE 3 0 SH DA (SUTURE) IMPLANT
SUT PROLENE 4 0 RB 1 (SUTURE)
SUT PROLENE 4-0 RB1 .5 CRCL 36 (SUTURE) IMPLANT
SUT SILK  1 MH (SUTURE) ×10
SUT SILK 1 MH (SUTURE) ×6 IMPLANT
SUT SILK 1 TIES 10X30 (SUTURE) IMPLANT
SUT SILK 2 0 SH (SUTURE) ×2 IMPLANT
SUT SILK 2 0SH CR/8 30 (SUTURE) IMPLANT
SUT VIC AB 1 CTX 18 (SUTURE) IMPLANT
SUT VIC AB 1 CTX 36 (SUTURE)
SUT VIC AB 1 CTX36XBRD ANBCTR (SUTURE) IMPLANT
SUT VIC AB 2-0 CTX 36 (SUTURE) ×3 IMPLANT
SUT VIC AB 3-0 X1 27 (SUTURE) ×10 IMPLANT
SUT VICRYL 0 TIES 12 18 (SUTURE) ×5 IMPLANT
SUT VICRYL 0 UR6 27IN ABS (SUTURE) ×10 IMPLANT
SUT VICRYL 2 TP 1 (SUTURE) IMPLANT
SYR 20ML ECCENTRIC (SYRINGE) ×5 IMPLANT
SYR 20ML LL LF (SYRINGE) ×5 IMPLANT
SYR 3ML LL SCALE MARK (SYRINGE) ×5 IMPLANT
SYR 50ML LL SCALE MARK (SYRINGE) ×5 IMPLANT
SYR 5ML LL (SYRINGE) ×5 IMPLANT
SYSTEM RETRIEVAL ANCHOR 12 (MISCELLANEOUS) ×2 IMPLANT
SYSTEM SAHARA CHEST DRAIN ATS (WOUND CARE) ×5 IMPLANT
TAPE CLOTH 4X10 WHT NS (GAUZE/BANDAGES/DRESSINGS) ×5 IMPLANT
TAPE CLOTH SURG 4X10 WHT LF (GAUZE/BANDAGES/DRESSINGS) ×2 IMPLANT
TAPE UMBILICAL COTTON 1/8X30 (MISCELLANEOUS) IMPLANT
TIP APPLICATOR SPRAY EXTEND 16 (VASCULAR PRODUCTS) IMPLANT
TOWEL GREEN STERILE (TOWEL DISPOSABLE) ×10 IMPLANT
TOWEL GREEN STERILE FF (TOWEL DISPOSABLE) ×5 IMPLANT
TRAP FLUID SMOKE EVACUATOR (MISCELLANEOUS) ×5 IMPLANT
TRAP SPECIMEN MUCOUS 40CC (MISCELLANEOUS) ×5 IMPLANT
TRAY FOLEY MTR SLVR 16FR STAT (SET/KITS/TRAYS/PACK) ×5 IMPLANT
TROCAR XCEL 12X100 BLDLESS (ENDOMECHANICALS) ×5 IMPLANT
TROCAR XCEL BLADELESS 5X75MML (TROCAR) IMPLANT
TUBE CONNECTING 20'X1/4 (TUBING) ×1
TUBE CONNECTING 20X1/4 (TUBING) ×4 IMPLANT
TUBING EXTENTION W/L.L. (IV SETS) ×5 IMPLANT
UNDERPAD 30X30 (UNDERPADS AND DIAPERS) ×5 IMPLANT
VALVE BIOPSY  SINGLE USE (MISCELLANEOUS) ×5
VALVE BIOPSY SINGLE USE (MISCELLANEOUS) ×3 IMPLANT
VALVE SUCTION BRONCHIO DISP (MISCELLANEOUS) ×5 IMPLANT
WATER STERILE IRR 1000ML POUR (IV SOLUTION) ×5 IMPLANT

## 2019-10-28 NOTE — Transfer of Care (Signed)
Immediate Anesthesia Transfer of Care Note  Patient: Chad Gomez  Procedure(s) Performed: VIDEO BRONCHOSCOPY WITH ENDOBRONCHIAL NAVIGATION WITH LUNG MARKINGS (N/A ) XI ROBOTIC ASSISTED THORASCOPY WITH  LEFT UPPER LOBE LUNG WEDGE RESECTION (Left Chest) Node Dissection (Chest) Intercostal Nerve Block (Left Chest)  Patient Location: PACU  Anesthesia Type:General  Level of Consciousness: drowsy  Airway & Oxygen Therapy: Patient Spontanous Breathing and Patient connected to face mask oxygen  Post-op Assessment: Report given to RN and Post -op Vital signs reviewed and stable  Post vital signs: Reviewed and stable  Last Vitals:  Vitals Value Taken Time  BP 140/98 10/28/19 1546  Temp    Pulse 101 10/28/19 1551  Resp 21 10/28/19 1551  SpO2 100 % 10/28/19 1551  Vitals shown include unvalidated device data.  Last Pain:  Vitals:   10/28/19 1026  TempSrc: Oral      Patients Stated Pain Goal: 3 (92/90/90 3014)  Complications: No apparent anesthesia complications

## 2019-10-28 NOTE — Progress Notes (Signed)
Admission from [facility] awake and alert.

## 2019-10-28 NOTE — H&P (Signed)
[address]       [place] [address]             [phone]                    Chad Gomez [place] Medical Record #[id number] Date of Birth: [birth year] Referring: Collene Gobble, MD Primary Care: [name], MD Primary Cardiologist: Minus Breeding, MD  Chief Complaint:    Chief Complaint  Patient presents with  . Lung Cancer    LUL nodule, ENB w/ biopsy 09/06/19, PET 08/16/19   History of Present Illness:    Chad Gomez 74 y.o. male is seen in the office for surgical evaluation of of new left upper lobe lung lesion, on recent navigation bronchoscopy confirmed to be non-small cell carcinoma of the lung.  Patient notes that he has been followed for several years in the pulmonary clinic for his underlying severe COPD.  He had been getting lung cancer screening CTs on a yearly basis for several years .  Because of the Covid pandemic he did not have a lung cancer screening yearly CT in 2020.  A follow-up CT scan was done February day that showed a new multiloculated 2 cm left upper lobe lesion.  This was confirmed to be hypermetabolic on PET scan without nodal involvement.  He underwent navigation bronchoscopy which demonstrated non-small cell carcinoma of the lung.  The patient was referred to thoracic surgery for consideration of resection with the known limitation of his underlying severe COPD.  He was noted that he developed a 40% pneumothorax following navigation bronchoscopy and biopsy treated with tube thoracostomy.  The patient has been a long-term smoker over 50 years at x2 packs a day for recently 1 pack a day.  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  Patient has known peripheral vascular disease having had aortobifem for abdominal aortic aneurysm and iliac disease right greater than left.  He has no specific history of myocardial infarction  or stent placements, he is followed by Dr. Percival Spanish for cardiology, with most recently seen for an abnormal EKG. nuclear stress test was done prior to his abdominal aortic surgery. He has been back to see cardiology for pre op clearances   Patient has a history of hypertension hyperlipidemia type 2 diabetes denies previous stroke. He has had problems with bladder outlet obstruction and is currently wears a Foley full-time.  Patient notes that he does not plan to stop smoking     Path: Clinical History: left upper lobe nodule (cm)  FINAL MICROSCOPIC DIAGNOSIS:  A. LUNG, LEFT UPPER LOBE, BIOPSY:  - Non-small cell carcinoma, see comment.  COMMENT:  There are scant malignant cells which are likely insufficient for  additional studies. Dr. Jeannie Done has reviewed the case.   CYTOLOGY - NON PAP  CASE: [id number]  PATIENT: Chad Gomez  Non-Gynecological Cytology Report   Clinical History: None provided   FINAL MICROSCOPIC DIAGNOSIS:   A. LUNG, LUL, BRUSHING:  - Malignant cells consistent with non-small cell carcinoma   B. LYMPH NODE, LUL, FINE NEEDLE ASPIRATION:  - Malignant cells consistent with non-small cell carcinoma   COMMENT:   B. There are only scant malignant cells which are positive for TTF-1 and  NapsinA. They are negative for cytokeratin 5/6 and p40. The  immunoprofile is consistent with a lung adenocarcinoma.  Dr. Jeannie Done has  reviewed the case.   SPECIMEN ADEQUACY:   A. Satisfactory for Evaluation  B. Satisfactory for Evaluation   GROSS:   Received is/are:(A)(1)2 slides (1 quick stain).(2)2 Slides (1 quick  stain).(3)2 Slides (1 quick stain).(B)(1)2 Slides (1 quick stain). Also  received are 5cc's of peach saline solution from needle  rinses.(TS:KJ:kj)  Smears:(A)6(B)2  Concentration Method (ThinPrep):(B)1  Cell Block:(B)1 Conventional  Additional Studies: Bronchial washing see [id number]   Current Activity/ Functional Status:  Patient is independent  with mobility/ambulation, transfers, ADL's, IADL's.   Zubrod Score: At the time of surgery this patient's most appropriate activity status/level should be described as: []     0    Normal activity, no symptoms [x]     1    Restricted in physical strenuous activity but ambulatory, able to do out light work []     2    Ambulatory and capable of self care, unable to do work activities, up and about               >50 % of waking hours                              []     3    Only limited self care, in bed greater than 50% of waking hours []     4    Completely disabled, no self care, confined to bed or chair []     5    Moribund   Past Medical History:  Diagnosis Date  . BPH (benign prostatic hypertrophy)    weak stream  . Chronic kidney disease   . Complication of anesthesia    urinary retention  . COPD (chronic obstructive pulmonary disease) ([place])   . Diabetes mellitus without complication ([place])    Type II  . History of bladder stone   . History of colon polyps    08/2013   pre-canerous  . History of kidney stones   . History of melanoma excision    LEFT EAR  . Hyperlipidemia   . Hypertension   . Iron deficiency   . Open wound    left lower jaw; followed by ENT  . Peripheral vascular disease ([place])   . Right ureteral stone   . Shingles 05-15-2015    Past Surgical History:  Procedure Laterality Date  . ABDOMINAL AORTAGRAM  11/01/2014   Procedure: Abdominal Aortagram;  Surgeon: Serafina Mitchell, MD;  Location: [facility];  Service: Cardiovascular;;  . AORTA - BILATERAL FEMORAL ARTERY BYPASS GRAFT N/A 12/28/2014   Procedure: AORTOBIFEMORAL BYPASS GRAFT;  Surgeon: Serafina Mitchell, MD;  Location: [facility];  Service: Vascular;  Laterality: N/A;  . BRONCHIAL BRUSHINGS  09/06/2019   Procedure: BRONCHIAL BRUSHINGS;  Surgeon: Collene Gobble, MD;  Location: [facility] ENDOSCOPY;  Service: Pulmonary;;  left upper lobe nodule  . BRONCHIAL WASHINGS  09/06/2019   Procedure: BRONCHIAL WASHINGS;   Surgeon: Collene Gobble, MD;  Location: [facility] ENDOSCOPY;  Service: Pulmonary;;  . COLONOSCOPY W/ POLYPECTOMY  09-13-2013  . CYSTOLITHALOPAXY OF BLADDER STONE/ TRANSRECTAL ULTRASOUND PROSTATE BX  08-19-2005  . CYSTOSCOPY W/ RETROGRADES Right 12/02/2012   Procedure: CYSTOSCOPY WITH RETROGRADE PYELOGRAM;  Surgeon: Malka So, MD;  Location: [facility];  Service: Urology;  Laterality: Right;  . CYSTOSCOPY W/ RETROGRADES Bilateral 10/13/2013   Procedure: CYSTOSCOPY WITH RETROGRADE PYELOGRAM;  Surgeon: Irine Seal, MD;  Location: [facility];  Service: Urology;  Laterality: Bilateral;  . CYSTOSCOPY W/ URETERAL STENT PLACEMENT Left 04/11/2014   Procedure: CYSTOSCOPY WITH STENT REPLACEMENT;  Surgeon: Malka So, MD;  Location: [facility];  Service: Urology;  Laterality: Left;  . CYSTOSCOPY WITH LITHOLAPAXY Right 12/02/2012   Procedure: CYSTOSCOPY WITH stone extraction;  Surgeon: Malka So, MD;  Location: [facility];  Service: Urology;  Laterality: Right;  . CYSTOSCOPY WITH LITHOLAPAXY Right 10/13/2013   Procedure: CYSTOSCOPY WITH LITHOLAPAXY, removal of urethral stone with basket;  Surgeon: Irine Seal, MD;  Location: [facility];  Service: Urology;  Laterality: Right;  . CYSTOSCOPY WITH RETROGRADE PYELOGRAM, URETEROSCOPY AND STENT PLACEMENT Left 03/21/2014   Procedure: CYSTOSCOPY WITH RETROGRADE PYELOGRAM, AND LEFT STENT PLACEMENT;  Surgeon: Alexis Frock, MD;  Location: [facility];  Service: Urology;  Laterality: Left;  . CYSTOSCOPY WITH RETROGRADE PYELOGRAM, URETEROSCOPY AND STENT PLACEMENT  09/09/2016   Procedure: CYSTOSCOPY WITH RIGHT  RETROGRADE PYELOGRAM, URETEROSCOPY WITH LASER BASKET EXTRACTION OF STONES  AND STENT PLACEMENT;  Surgeon: Irine Seal, MD;  Location: [facility];  Service: Urology;;  . Consuela Mimes WITH URETEROSCOPY Left 04/11/2014   Procedure: CYSTOSCOPY WITH URETEROSCOPY/ STONE EXTRACTION;  Surgeon:  Malka So, MD;  Location: [facility];  Service: Urology;  Laterality: Left;  . FINE NEEDLE ASPIRATION  09/06/2019   Procedure: FINE NEEDLE ASPIRATION;  Surgeon: Collene Gobble, MD;  Location: [facility] ENDOSCOPY;  Service: Pulmonary;;  left upper lobe - lung  . HOLMIUM LASER APPLICATION Right 5/62/1308   Procedure: HOLMIUM LASER APPLICATION;  Surgeon: Malka So, MD;  Location: [facility];  Service: Urology;  Laterality: Right;  . HOLMIUM LASER APPLICATION Left 65/78/4696   Procedure: HOLMIUM LASER APPLICATION;  Surgeon: Malka So, MD;  Location: [facility];  Service: Urology;  Laterality: Left;  . HOLMIUM LASER APPLICATION Right 2/95/2841   Procedure: HOLMIUM LASER APPLICATION;  Surgeon: Irine Seal, MD;  Location: [facility];  Service: Urology;  Laterality: Right;  . INGUINAL HERNIA REPAIR Right 1954  . LITHOTRIPSY  2007 & 02/2013  . LUNG BIOPSY  09/06/2019   Procedure: LUNG BIOPSY;  Surgeon: Collene Gobble, MD;  Location: [facility] ENDOSCOPY;  Service: Pulmonary;;  left upper lob lung  . [place]  . MICROLARYNGOSCOPY Right 02/18/2016   Procedure: MICRO DIRECT LARYNGOSCOPY EXCISIONAL BIOPSY OF RIGHT VOCAL CORD MASS;  Surgeon: Leta Baptist, MD;  Location: [facility];  Service: ENT;  Laterality: Right;  . [place]   left ear  . MULTIPLE TOOTH EXTRACTIONS  1994  . NEPHROLITHOTOMY Left 01/27/2013   Procedure: NEPHROLITHOTOMY PERCUTANEOUS FIRST LOOK;  Surgeon: Malka So, MD;  Location: [facility];  Service: Urology;  Laterality: Left;  . PERIPHERAL VASCULAR CATHETERIZATION N/A 11/01/2014   Procedure: Lower Extremity Angiography;  Surgeon: Serafina Mitchell, MD;  Location: [facility];  Service: Cardiovascular;  Laterality: N/A;  . URETEROSCOPY Right 12/02/2012   Procedure: RIGHT URETEROSCOPY STONE EXTRACTION WITH  STENT PLACEMENT;  Surgeon: Malka So, MD;  Location: [facility];   Service: Urology;  Laterality: Right;  [place] VIDEO BRONCHOSCOPY WITH ENDOBRONCHIAL NAVIGATION N/A 09/06/2019   Procedure: VIDEO BRONCHOSCOPY WITH ENDOBRONCHIAL NAVIGATION;  Surgeon: Collene Gobble, MD;  Location: [facility]OSCOPY;  Service: Pulmonary;  Laterality: N/A;    Family History  Problem Relation Age of Onset  . Dementia Mother   . Hypertension Mother   . Stroke Mother  76  . Dementia Father   . Hypertension Sister   . Colon cancer Neg Hx      Social History   Tobacco Use  Smoking Status Current Every Day Smoker  . Packs/day: 1.00  . Years: 59.00  . Pack years: 59.00  . Types: Cigarettes  . Start date: 06/24/1959  Smokeless Tobacco Never Used    Social History   Substance and Sexual Activity  Alcohol Use No  . Alcohol/week: 0.0 standard drinks     Allergies  Allergen Reactions  . Morphine And Related Anxiety and Other (See Comments)    Combative, incoherent    . Tape Other (See Comments) and Rash    Surgical tape Adhesive tape causes blisters  . Amoxicillin Other (See Comments)    Severe stomach pain  . Naproxen Swelling    Eyes swell    Current Facility-Administered Medications  Medication Dose Route Frequency Provider Last Rate Last Admin  . acetaminophen (TYLENOL) tablet 1,000 mg  1,000 mg Oral Once Catalina Gravel, MD      . fentaNYL (SUBLIMAZE) 100 MCG/2ML injection           . midazolam (VERSED) 2 MG/2ML injection           . vancomycin (VANCOCIN) 1-5 GM/200ML-% IVPB           . vancomycin (VANCOCIN) IVPB 1000 mg/200 mL premix  1,000 mg Intravenous On Call to OR [name], MD         Review of Systems:     Cardiac Review of Systems: [Y] = yes  or   [ N ] = no   Chest Pain Florencio.Farrier    ]  Resting SOB [ n  ] Exertional SOB  Blue.Reese  ]  Vertell Limber Florencio.Farrier  ]   Pedal Edema [  n ]    Palpitations Florencio.Farrier  ] Syncope  [ n ]   Presyncope [  n ]   General Review of Systems: [Y] = yes [  ]=no Constitional: recent weight change [  ];  Wt loss over the last 3 months [    ] anorexia [  ]; fatigue [  ]; nausea [  ]; night sweats [  ]; fever [  ]; or chills [  ];           Eye : blurred vision [  ]; diplopia [   ]; vision changes [  ];  Amaurosis fugax[  ]; Resp: cough Blue.Reese  ];  wheezing[ y ];  hemoptysis[  ]; shortness of breath[y  ]; paroxysmal nocturnal dyspnea[  ]; dyspnea on exertion[ y ]; or orthopnea[  ];  GI:  gallstones[  ], vomiting[  ];  dysphagia[  ]; melena[  ];  hematochezia [  ]; heartburn[  ];   Hx of  Colonoscopy[  ]; GU: kidney stones [  ]; hematuria[  ];   dysuria [  ];  nocturia[  ];  history of     obstruction [  ]; urinary frequency [  ]             Skin: rash, swelling[  ];, hair loss[  ];  peripheral edema[  ];  or itching[  ]; Musculosketetal: myalgias[  ];  joint swelling[  ];  joint erythema[  ];  joint pain[  ];  back pain[  ];  Heme/Lymph: bruising[  ];  bleeding[  ];  anemia[  ];  Neuro: TIA[  ];  headaches[  ];  stroke[  ];  vertigo[  ];  seizures[  ];   paresthesias[y  ];  difficulty walking[  ];  Psych:depression[  ]; anxiety[  ];  Endocrine: diabetes[y  ];  thyroid dysfunction[  ];  Immunizations: Flu up to date [  ]; Pneumococcal up to date [  ];  Other: Indwelling Foley    PHYSICAL EXAMINATION: BP 130/75   Pulse 92   Temp 98.1 F (36.7 C) (Oral)   Resp 18   Ht 5\' 10"  (1.778 m)   Wt 82.6 kg   SpO2 97%   BMI 26.11 kg/m  General appearance: alert, cooperative and no distress Head: Normocephalic, without obvious abnormality, atraumatic Neck: no adenopathy, no carotid bruit, no JVD, supple, symmetrical, trachea midline and thyroid not enlarged, symmetric, no tenderness/mass/nodules Lymph nodes: Cervical, supraclavicular, and axillary nodes normal. Resp: clear to auscultation bilaterally Cardio: regular rate and rhythm, S1, S2 normal, no murmur, click, rub or gallop GI: soft, non-tender; bowel sounds normal; no masses,  no organomegaly and Large incisional ventral hernia Extremities: extremities normal, atraumatic, no  cyanosis or edema and Palpable DP and PT pulses bilaterally left greater than the right Neurologic: Grossly normal  sensory: No evidence of peripheral neuropathy Foley in place  Diagnostic Studies & Laboratory data:     Recent Radiology Findings:  DG Chest 2 View  Result Date: 10/26/2019 CLINICAL DATA:  74 year old male with history of left upper lobe non-small cell carcinoma. EXAM: CHEST - 2 VIEW COMPARISON:  Chest x-ray 09/07/2019. FINDINGS: Lung volumes are increased with emphysematous changes. No consolidative airspace disease. No pleural effusions. No pneumothorax. Nodularity in the medial aspect of the lingula. Pulmonary vasculature and the cardiomediastinal silhouette are within normal limits. Aortic atherosclerosis. IMPRESSION: 1. Nodularity noted in the medial aspect of the lingula, better demonstrated on prior chest CT 09/01/2019. 2. Emphysema. 3. Aortic atherosclerosis. Electronically Signed   By: Vinnie Langton M.D.   On: 10/26/2019 16:25   MYOCARDIAL PERFUSION IMAGING  Result Date: 10/21/2019  There was no ST segment deviation noted during stress.  Nuclear stress EF: 75%.  The left ventricular ejection fraction is hyperdynamic (>65%).  The study is normal.  This is a low risk study.    US THYROID  Result Date: 09/28/2019 CLINICAL DATA:  Left nodule on physical exam. History of lung carcinoma. EXAM: THYROID ULTRASOUND TECHNIQUE: Ultrasound examination of the thyroid gland and adjacent soft tissues was performed. COMPARISON:  None. FINDINGS: Parenchymal Echotexture: Moderately heterogenous Isthmus: 0.5 cm thickness Right lobe: 4 x 2.2 x 1.9 cm Left lobe: 4.3 x 2 x 1.3 cm _________________________________________________________ Estimated total number of nodules >/= 1 cm: 1 Number of spongiform nodules >/=  2 cm not described below (TR1): 0 Number of mixed cystic and solid nodules >/= 1.5 cm not described below (TR2): 0 _________________________________________________________ Nodule #  1: Location: Right; Superior Maximum size: 1.2 cm; Other 2 dimensions: 0.8 x 0.7 cm Composition: mixed cystic and solid (1) Echogenicity: hypoechoic (2) Shape: not taller-than-wide (0) Margins: smooth (0) Echogenic foci: none (0) ACR TI-RADS total Gomez: 3. ACR TI-RADS risk category: TR3 (3 Gomez). ACR TI-RADS recommendations: Given size (<1.4 cm) and appearance, this nodule does NOT meet TI-RADS criteria for biopsy or dedicated follow-up. _________________________________________________________ 0.4 cm coarse calcification, mid right lobe; This nodule does NOT meet TI-RADS criteria for biopsy or dedicated follow-up. 0.8 cm hypoechoic nodule without calcifications, anterior mid left; This nodule does NOT meet TI-RADS criteria for biopsy or dedicated follow-up. 0.8 cm hypoechoic nodule without calcifications, mid  left; This nodule does NOT meet TI-RADS criteria for biopsy or dedicated follow-up. IMPRESSION: 1. Heterogenous thyroid with bilateral nodules. None meets criteria for biopsy or dedicated imaging follow-up. The above is in keeping with the ACR TI-RADS recommendations - J Am Coll Radiol 2017;14:587-595. Electronically Signed   By: Lucrezia Europe M.D.   On: 09/28/2019 16:43     CLINICAL DATA:  Initial treatment strategy for bilobed medial left upper lobe lesion on recent screening chest CT study.  EXAM: NUCLEAR MEDICINE PET SKULL BASE TO THIGH  TECHNIQUE: 9.2 mCi F-18 FDG was injected intravenously. Full-ring PET imaging was performed from the skull base to thigh after the radiotracer. CT data was obtained and used for attenuation correction and anatomic localization.  Fasting blood glucose: 100 mg/dl  COMPARISON:  08/01/2019 screening chest CT.  FINDINGS: Mediastinal blood pool activity: SUV max 2.6  Liver activity: SUV max NA  NECK: No enlarged or hypermetabolic lymph nodes in the neck.  Mildly hypermetabolic 1.0 cm anterior left thyroid nodule with max SUV 3.8 (series 4/image  47).  Incidental CT findings: Small mucous retention cysts versus polyps in the dependent maxillary sinuses. No fluid levels.  CHEST:  Intensely hypermetabolic irregular 2.8 x 2.5 cm solid pulmonary nodule in anteromedial left upper lobe (series 8/image 41) with max SUV 13.7. No additional hypermetabolic pulmonary findings.  No enlarged or hypermetabolic hilar, mediastinal or axillary lymph nodes.  Mild hypermetabolism (max SUV 3.6) associated with superficial subcutaneous 1.0 cm cystic lesion in the posterior right shoulder (series 4/image 36).  Incidental CT findings: Coronary atherosclerosis. Atherosclerotic nonaneurysmal thoracic aorta. Severe centrilobular emphysema. No additional significant pulmonary nodules.  ABDOMEN/PELVIS: No abnormal hypermetabolic activity within the liver, pancreas, adrenal glands, or spleen. No hypermetabolic lymph nodes in the abdomen or pelvis.  Incidental CT findings: Small hiatal hernia. Atherosclerotic abdominal aorta status post aortobifemoral surgical graft placed. Asymmetric severe atrophy of the right kidney. Fat containing 1.2 cm posteroinferior right liver lesion, compatible with liver angiomyolipoma. Cholelithiasis. Marked prostatomegaly with mass-effect on the bladder base by the enlarged nodular median lobe of the prostate. Foley catheter in place within the nondistended bladder. Large supraumbilical ventral abdominal hernia containing multiple small bowel loops without acute bowel complication.  SKELETON: No focal hypermetabolic activity to suggest skeletal metastasis.  Incidental CT findings: none  IMPRESSION: 1.Intensely hypermetabolic (max SUV 89.3) irregular 2.8 x 2.5 cm solid pulmonary nodule in the anteromedial left upper lobe, primary bronchogenic carcinoma.  On navigation bronchoscopy  2. No hypermetabolic thoracic adenopathy or distant metastatic disease. PET-CT/clinical stage IA (T1c N0 M0).  3. Mildly  hypermetabolic 1.0 cm anterior left thyroid nodule. Plan thyroid US - done Heterogenous thyroid with bilateral nodules. None meets criteria for biopsy or dedicated imaging follow-up.   4.  Chronic findings include: Aortic Atherosclerosis (ICD10-I70.0) and Emphysema (ICD10-J43.9). Coronary atherosclerosis. Small hiatal hernia. Cholelithiasis. Marked prostatomegaly.   Electronically Signed   By: Ilona Sorrel M.D.   On: 08/16/2019 14:48  CT Super D Chest Wo Contrast  Result Date: 09/01/2019 CLINICAL DATA:  Left upper lobe pulmonary nodule. EXAM: CT CHEST WITHOUT CONTRAST TECHNIQUE: Multidetector CT imaging of the chest was performed using thin slice collimation for electromagnetic bronchoscopy planning purposes, without intravenous contrast. COMPARISON:  Multiple exams, including 08/01/2019 FINDINGS: Cardiovascular: Coronary, aortic arch, and branch vessel atherosclerotic vascular disease. Mediastinum/Nodes: Small type 1 hiatal hernia. Lungs/Pleura: Bilobed left upper lobe pulmonary nodule anteriorly, with upper portion measuring 2.6 by 1.9 cm (formerly 2.8 by 1.8 cm) and lower portion measuring 2.3 by  1.7 cm (formerly 2.5 by 1.6 cm). This lesion was previously hypermetabolic. Centrilobular emphysema. Mild bilateral airway thickening. Mucus noted in the left mainstem bronchus. Scarring in the lingula noted. Upper Abdomen: Cholelithiasis. Hypodense lesion posteriorly in the right hepatic lobe likely has fatty elements. Suggesting angiomyolipoma. Musculoskeletal: Mild thoracic spondylosis. IMPRESSION: 1. Similar size of the bilobed left upper lobe pulmonary nodule. 2. Other imaging findings of potential clinical significance: Coronary, aortic arch, and branch vessel atherosclerotic vascular disease. Small type 1 hiatal hernia. Cholelithiasis. Probable angiomyolipoma posteriorly in the right hepatic lobe. Mild bilateral airway thickening. Aortic Atherosclerosis (ICD10-I70.0). Emphysema (ICD10-J43.9).  Electronically Signed   By: Van Clines M.D.   On: 09/01/2019 13:18     I have independently reviewed the above radiology studies  and reviewed the findings with the patient.   Recent Lab Findings: Lab Results  Component Value Date   WBC 7.6 10/26/2019   HGB 16.9 10/26/2019   HCT 49.7 10/26/2019   PLT 223 10/26/2019   GLUCOSE 120 (H) 10/26/2019   ALT 20 10/26/2019   AST 19 10/26/2019   NA 136 10/26/2019   K 4.0 10/26/2019   CL 102 10/26/2019   CREATININE 1.00 10/26/2019   BUN 7 (L) 10/26/2019   CO2 24 10/26/2019   INR 1.1 10/26/2019   PFT's  08/2017    fev1 1.03   31%  07/2019  FEV1  1.46      47%                    With bronchodilators  1.83     59%    + 24%               DLCO  15.96  64%   Assessment / Plan:   1.Intensely hypermetabolic (max SUV 93.7) irregular 2.8 x 2.5 cm solid pulmonary nodule in the anteromedial left upper lobe, primary bronchogenic carcinoma on navigation bronchoscopy-  discussed with the patient the surgical options versus stereotactic radiotherapy.  With the location being just anterior to the heart a limited resection-robotically was recommended to the patient is most efficacious treatment. Preop clearance by Dr. Percival Spanish  2. No hypermetabolic thoracic adenopathy or distant metastatic disease. PET-CT/clinical stage IA (T1c N0 M0).  3. Mildly hypermetabolic 1.0 cm anterior left thyroid nodule. Plan thyroid US and biopsy -done Heterogenous thyroid with bilateral nodules. None meets criteria for biopsy or dedicated imaging follow-up.   4. Cholelithiasis.   5. Marked prostatomegaly with Chronic indwelling Foley for urinary bladder obstruction  6. Known peripheral vascular disease status post aortobi fem  7. Long-term tobacco use with severe underlying COPD  8. Diabetes with complications of vascular disease and peripheral neuropathy   The goals risks and alternatives of the planned surgical procedure Procedure(s): VIDEO BRONCHOSCOPY WITH  ENDOBRONCHIAL NAVIGATION WITH LUNG MARKINGS (N/A) XI ROBOTIC ASSISTED THORASCOPY-RESECTION OF LEFT LUNG (Left)  have been discussed with the patient in detail. The risks of the procedure including death, infection, stroke, myocardial infarction, bleeding, blood transfusion have all been discussed specifically.  I have quoted Chad Gomez a 3 % of perioperative mortality and a complication rate as high as 50%. The patient's questions have been answered.Chad Gomez is willing  to proceed with the planned procedure.  [name] MD      [address] [place] [address] Office [phone]     10/28/2019 11:13 AM

## 2019-10-28 NOTE — Anesthesia Procedure Notes (Signed)
Arterial Line Insertion Performed by: CRNA  Preanesthetic checklist: patient identified, IV checked, site marked, risks and benefits discussed, surgical consent, monitors and equipment checked, pre-op evaluation, timeout performed and anesthesia consent Right, radial was placed Catheter size: 20 G Hand hygiene performed , maximum sterile barriers used  and Seldinger technique used Allen's test indicative of satisfactory collateral circulation Attempts: 1 Procedure performed without using ultrasound guided technique. Following insertion, dressing applied and Biopatch. Post procedure assessment: normal  Patient tolerated the procedure well with no immediate complications.

## 2019-10-28 NOTE — Anesthesia Procedure Notes (Signed)
Procedure Name: Intubation Date/Time: 10/28/2019 12:40 PM Performed by: Catalina Gravel, MD Pre-anesthesia Checklist: Patient identified, Emergency Drugs available, Suction available and Patient being monitored Patient Re-evaluated:Patient Re-evaluated prior to induction Oxygen Delivery Method: Circle system utilized Preoxygenation: Pre-oxygenation with 100% oxygen Induction Type: IV induction Ventilation: Mask ventilation without difficulty Laryngoscope Size: Mac and 4 Tube type: Oral Endobronchial tube: Double lumen EBT and Right and 39 Fr Number of attempts: 1 Airway Equipment and Method: Stylet and Oral airway Placement Confirmation: ETT inserted through vocal cords under direct vision,  positive ETCO2 and breath sounds checked- equal and bilateral Secured at: 29 cm Tube secured with: Tape Dental Injury: Teeth and Oropharynx as per pre-operative assessment

## 2019-10-28 NOTE — Anesthesia Procedure Notes (Signed)
Procedure Name: Intubation Date/Time: 10/28/2019 12:06 PM Performed by: Catalina Gravel, MD Pre-anesthesia Checklist: Patient identified, Emergency Drugs available, Suction available and Patient being monitored Patient Re-evaluated:Patient Re-evaluated prior to induction Oxygen Delivery Method: Circle system utilized Preoxygenation: Pre-oxygenation with 100% oxygen Induction Type: IV induction Ventilation: Oral airway inserted - appropriate to patient size and Two handed mask ventilation required Laryngoscope Size: Mac and 4 Grade View: Grade I Tube type: Oral Tube size: 8.5 mm Number of attempts: 1 Airway Equipment and Method: Stylet and Oral airway Placement Confirmation: ETT inserted through vocal cords under direct vision,  positive ETCO2 and breath sounds checked- equal and bilateral Secured at: 23 cm Tube secured with: Tape Dental Injury: Teeth and Oropharynx as per pre-operative assessment

## 2019-10-28 NOTE — Brief Op Note (Signed)
      [address]       [place] [address]             [phone]     10/28/2019  11:38 AM  PATIENT:  Chad Gomez  74 y.o. male  PRE-OPERATIVE DIAGNOSIS:  LUL lung nodule  POST-OPERATIVE DIAGNOSIS:  LUL lung nodule  PROCEDURE:  Procedure(s):  VIDEO BRONCHOSCOPY WITH ENDOBRONCHIAL NAVIGATION WITH LUNG MARKINGS (N/A)  XI ROBOTIC ASSISTED THORASCOPY  -Wedge Resection Lingula Left Upper Lobe  Node Dissection Intercostal Nerve Block (Left)  SURGEON:  Surgeon(s) and Role:    * [name], MD - Primary  PHYSICIAN ASSISTANT: [name] PA-C  ANESTHESIA:   general  EBL:  20 mL   BLOOD ADMINISTERED:none  DRAINS: 28 Blake Drain   LOCAL MEDICATIONS USED:  BUPIVICAINE   SPECIMEN:  Source of Specimen:  Wedge Resection Lingula left upper lobe, 4L Lymph Node  DISPOSITION OF SPECIMEN:  PATHOLOGY  COUNTS:  YES   DICTATION: .Dragon Dictation  PLAN OF CARE: Admit to inpatient   PATIENT DISPOSITION:  PACU - hemodynamically stable.   Delay start of Pharmacological VTE agent (>24hrs) due to surgical blood loss or risk of bleeding: no

## 2019-10-28 NOTE — Anesthesia Procedure Notes (Signed)
Central Venous Catheter Insertion Performed by: Catalina Gravel, MD, anesthesiologist Start/End5/12/2019 11:12 AM, 10/28/2019 11:22 AM Preanesthetic checklist: patient identified, IV checked, site marked, risks and benefits discussed, surgical consent, monitors and equipment checked, pre-op evaluation, timeout performed and anesthesia consent Position: Trendelenburg Lidocaine 1% used for infiltration and patient sedated Hand hygiene performed , maximum sterile barriers used  and Seldinger technique used Catheter size: 8 Fr Total catheter length 16. Central line was placed.Double lumen Procedure performed using ultrasound guided technique. Ultrasound Notes:anatomy identified, needle tip was noted to be adjacent to the nerve/plexus identified, no ultrasound evidence of intravascular and/or intraneural injection and image(s) printed for medical record Attempts: 1 Following insertion, line sutured, dressing applied and Biopatch. Post procedure assessment: blood return through all ports, free fluid flow and no air  Patient tolerated the procedure well with no immediate complications.

## 2019-10-29 ENCOUNTER — Inpatient Hospital Stay (HOSPITAL_COMMUNITY): Payer: Medicare Other

## 2019-10-29 ENCOUNTER — Encounter: Payer: Self-pay | Admitting: *Deleted

## 2019-10-29 LAB — CBC
HCT: 42.5 % (ref 39.0–52.0)
Hemoglobin: 14.4 g/dL (ref 13.0–17.0)
MCH: 31 pg (ref 26.0–34.0)
MCHC: 33.9 g/dL (ref 30.0–36.0)
MCV: 91.6 fL (ref 80.0–100.0)
Platelets: 170 10*3/uL (ref 150–400)
RBC: 4.64 MIL/uL (ref 4.22–5.81)
RDW: 13.8 % (ref 11.5–15.5)
WBC: 12.4 10*3/uL — ABNORMAL HIGH (ref 4.0–10.5)
nRBC: 0 % (ref 0.0–0.2)

## 2019-10-29 LAB — BLOOD GAS, ARTERIAL
Acid-Base Excess: 1.4 mmol/L (ref 0.0–2.0)
Bicarbonate: 25.6 mmol/L (ref 20.0–28.0)
Drawn by: 414221
FIO2: 28
O2 Saturation: 94.1 %
Patient temperature: 36.8
pCO2 arterial: 41.5 mmHg (ref 32.0–48.0)
pH, Arterial: 7.407 (ref 7.350–7.450)
pO2, Arterial: 66.5 mmHg — ABNORMAL LOW (ref 83.0–108.0)

## 2019-10-29 LAB — GLUCOSE, CAPILLARY
Glucose-Capillary: 154 mg/dL — ABNORMAL HIGH (ref 70–99)
Glucose-Capillary: 181 mg/dL — ABNORMAL HIGH (ref 70–99)
Glucose-Capillary: 172 mg/dL — ABNORMAL HIGH (ref 70–99)
Glucose-Capillary: 144 mg/dL — ABNORMAL HIGH (ref 70–99)
Glucose-Capillary: 138 mg/dL — ABNORMAL HIGH (ref 70–99)

## 2019-10-29 LAB — BASIC METABOLIC PANEL WITH GFR
Anion gap: 11 (ref 5–15)
BUN: 10 mg/dL (ref 8–23)
CO2: 26 mmol/L (ref 22–32)
Calcium: 9.5 mg/dL (ref 8.9–10.3)
Chloride: 100 mmol/L (ref 98–111)
Creatinine, Ser: 1.11 mg/dL (ref 0.61–1.24)
GFR calc Af Amer: >60 mL/min (ref >60)
GFR calc non Af Amer: >60 mL/min (ref >60)
Glucose, Bld: 169 mg/dL — ABNORMAL HIGH (ref 70–99)
Potassium: 3.9 mmol/L (ref 3.5–5.1)
Sodium: 137 mmol/L (ref 135–145)

## 2019-10-29 MED ORDER — METFORMIN HCL ER 500 MG PO TB24
500.0000 mg | ORAL_TABLET | Freq: Every day | ORAL | Status: DC
  Administered 2019-10-30 – 2019-11-05 (×7): 500 mg via ORAL
  Filled 2019-10-29 (×8): qty 1

## 2019-10-29 MED ORDER — INSULIN ASPART 100 UNIT/ML ~~LOC~~ SOLN: 0.0000 [IU] | SUBCUTANEOUS | Status: DC

## 2019-10-29 MED ORDER — INSULIN ASPART 100 UNIT/ML ~~LOC~~ SOLN
0.0000 [IU] | Freq: Three times a day (TID) | SUBCUTANEOUS | Status: DC
  Administered 2019-11-04: 4 [IU] via SUBCUTANEOUS

## 2019-10-29 MED ORDER — IPRATROPIUM-ALBUTEROL 0.5-2.5 (3) MG/3ML IN SOLN: 3.0000 mL | Freq: Four times a day (QID) | RESPIRATORY_TRACT | Status: DC | PRN

## 2019-10-29 MED ORDER — METFORMIN HCL ER 500 MG PO TB24: 500.0000 mg | ORAL_TABLET | ORAL | Status: DC

## 2019-10-29 MED ORDER — METFORMIN HCL ER 500 MG PO TB24
1000.0000 mg | ORAL_TABLET | Freq: Every day | ORAL | Status: DC
  Administered 2019-10-29 – 2019-11-04 (×7): 1000 mg via ORAL
  Filled 2019-10-29 (×7): qty 2

## 2019-10-29 NOTE — Progress Notes (Signed)
Patient ID: Chad Gomez, male   DOB: 05-28-1946, 74 y.o.   MRN: [id number] [facility] DAILY ICU PROGRESS NOTE                   [address]            [place] [address]          [phone]   1 Day Post-Op Procedure(s) (LRB): VIDEO BRONCHOSCOPY WITH ENDOBRONCHIAL NAVIGATION WITH LUNG MARKINGS (N/A) XI ROBOTIC ASSISTED THORASCOPY WITH  LEFT UPPER LOBE LUNG WEDGE RESECTION (Left) Node Dissection Intercostal Nerve Block (Left)  Total Length of Stay:  LOS: 1 day   Subjective: Patient awake alert with good respiratory effort, pain appears well controlled, patient primarily concerned about various medications to take or not take  Objective: Vital signs in last 24 hours: Temp:  [97.9 F (36.6 C)-98.5 F (36.9 C)] 97.9 F (36.6 C) (05/08 0800) Pulse Rate:  [71-97] 86 (05/08 0800) Cardiac Rhythm: Heart block (05/08 0759) Resp:  [14-21] 19 (05/08 0800) BP: (110-140)/(60-98) 127/73 (05/08 0800) SpO2:  [90 %-100 %] 99 % (05/08 0858) Arterial Line BP: (124-184)/(54-73) 151/58 (05/08 0800)  Filed Weights   10/28/19 1026  Weight: 82.6 kg    Weight change:    Hemodynamic parameters for last 24 hours:    Intake/Output from previous day: 05/07 0701 - 05/08 0700 In: 2950.4 [P.O.:240; I.V.:2460.4; IV Piggyback:250] Out: 2560 [Urine:2460; Blood:20; Chest Tube:80]  Intake/Output this shift: No intake/output data recorded.  Current Meds: Scheduled Meds: . acetaminophen  1,000 mg Oral Q6H   Or  . acetaminophen (TYLENOL) oral liquid 160 mg/5 mL  1,000 mg Oral Q6H  . amLODipine  10 mg Oral Daily  . arformoterol  15 mcg Nebulization BID  . aspirin EC  81 mg Oral Daily  . atorvastatin  40 mg Oral Daily  . bisacodyl  10 mg Oral Daily  . Chlorhexidine Gluconate Cloth  6 each Topical Daily  . enoxaparin (LOVENOX) injection  40 mg Subcutaneous Daily  . ezetimibe  5 mg Oral Daily  . finasteride  5 mg Oral Daily  . fluticasone  2 spray Each Nare Daily  . indapamide   1.25 mg Oral Daily  . insulin aspart  0-24 Units Subcutaneous Q4H  . loratadine  10 mg Oral Daily  . magnesium oxide  200 mg Oral Daily  . metFORMIN  500 mg Oral Q breakfast   And  . metFORMIN  1,000 mg Oral Q supper  . metoCLOPramide (REGLAN) injection  10 mg Intravenous Q6H  . potassium citrate  10 mEq Oral BID  . senna-docusate  1 tablet Oral QHS  . tamsulosin  0.4 mg Oral BID  . umeclidinium bromide  1 puff Inhalation Daily   Continuous Infusions: . sodium chloride     PRN Meds:.Place/Maintain arterial line **AND** sodium chloride, albuterol, fentaNYL (SUBLIMAZE) injection, ipratropium-albuterol, ondansetron (ZOFRAN) IV, oxyCODONE, traMADol  General appearance: alert, cooperative and no distress Neurologic: intact Heart: regular rate and rhythm, S1, S2 normal, no murmur, click, rub or gallop Lungs: diminished breath sounds bibasilar Abdomen: soft, non-tender; bowel sounds normal; no masses,  no organomegaly Extremities: extremities normal, atraumatic, no cyanosis or edema and Homans sign is negative, no sign of DVT Wound: Wounds intact, chest tube intact, very small intermittent air leak 1 chamber not necessarily associated with cough  Lab Results: CBC: Recent Labs    10/26/19 1141 10/26/19 1141 10/28/19 1501 10/29/19 0345  WBC 7.6  --   --  12.4*  HGB 16.9   < > 15.3 14.4  HCT 49.7   < > 45.0 42.5  PLT 223  --   --  170   < > = values in this interval not displayed.   BMET:  Recent Labs    10/26/19 1141 10/26/19 1141 10/28/19 1501 10/29/19 0345  NA 136   < > 135 137  K 4.0   < > 4.5 3.9  CL 102  --   --  100  CO2 24  --   --  26  GLUCOSE 120*  --   --  169*  BUN 7*  --   --  10  CREATININE 1.00  --   --  1.11  CALCIUM 10.1  --   --  9.5   < > = values in this interval not displayed.    CMET: Lab Results  Component Value Date   WBC 12.4 (H) 10/29/2019   HGB 14.4 10/29/2019   HCT 42.5 10/29/2019   PLT 170 10/29/2019   GLUCOSE 169 (H) 10/29/2019    ALT 20 10/26/2019   AST 19 10/26/2019   NA 137 10/29/2019   K 3.9 10/29/2019   CL 100 10/29/2019   CREATININE 1.11 10/29/2019   BUN 10 10/29/2019   CO2 26 10/29/2019   INR 1.1 10/26/2019      PT/INR:  Recent Labs    10/26/19 1141  LABPROT 13.3  INR 1.1   Radiology: DG CHEST PORT 1 VIEW  Result Date: 10/29/2019 CLINICAL DATA:  Postoperative lobectomy.  Chest tube present EXAM: PORTABLE CHEST 1 VIEW COMPARISON:  Oct 28, 2019 FINDINGS: Chest tube remains on the left, unchanged. Postoperative change on the left. There is a minimal left apical pneumothorax. Right jugular catheter tip is in the superior vena cava. There is atelectatic change in the lung bases. There is also patchy airspace opacity in each lower lung zone, increased from 1 day prior. Heart size and pulmonary vascularity are within normal limits. There is aortic atherosclerosis. No bone lesions. IMPRESSION: 1. Chest tube remains on the left with minimal left apical pneumothorax. Stable central catheter position. 2. Atelectatic change in both lower lung regions is essentially stable. Ill-defined airspace opacity noted in each lung base, concerning for developing pneumonia in the bases, slightly more prominent on the right than the left. 3. 3.  Postoperative change on the left noted. 4. Stable cardiac silhouette.  Aortic Atherosclerosis (ICD10-I70.0). Electronically Signed   By: Lowella Grip III M.D.   On: 10/29/2019 09:07   DG Chest Port 1 View  Result Date: 10/28/2019 CLINICAL DATA:  Post bronchoscopy EXAM: PORTABLE CHEST 1 VIEW COMPARISON:  10/26/2019 FINDINGS: Left apical chest tube is present. Right IJ central line tip overlies SVC. No definite pneumothorax. Left perihilar density, which may be related to mass and biopsy changes. No pleural effusion. Normal heart size. IMPRESSION: Lines and tubes as above.  No definite pneumothorax. Left perihilar density, which may be related to mass and biopsy changes. Electronically Signed    By: Macy Mis M.D.   On: 10/28/2019 16:38   DG C-ARM BRONCHOSCOPY  Result Date: 10/28/2019 C-ARM BRONCHOSCOPY: Fluoroscopy was utilized by the requesting physician.  No radiographic interpretation.     Assessment/Plan: S/P Procedure(s) (LRB): VIDEO BRONCHOSCOPY WITH ENDOBRONCHIAL NAVIGATION WITH LUNG MARKINGS (N/A) XI ROBOTIC ASSISTED THORASCOPY WITH  LEFT UPPER LOBE LUNG WEDGE RESECTION (Left) Node Dissection Intercostal Nerve Block (Left) Mobilize Leave Foley as patient is chronically with indwelling Foley per urology  DC A-line Resume Metformin Leave chest tube in place to suction currently-follow-up chest x-ray in a.m. Overall patient is stable with good respiratory effort  [name] 10/29/2019 11:00 AM

## 2019-10-29 NOTE — Anesthesia Postprocedure Evaluation (Signed)
Anesthesia Post Note  Patient: Chad Gomez  Procedure(s) Performed: VIDEO BRONCHOSCOPY WITH ENDOBRONCHIAL NAVIGATION WITH LUNG MARKINGS (N/A ) XI ROBOTIC ASSISTED THORASCOPY WITH  LEFT UPPER LOBE LUNG WEDGE RESECTION (Left Chest) Node Dissection (Chest) Intercostal Nerve Block (Left Chest)     Patient location during evaluation: PACU Anesthesia Type: General Level of consciousness: awake and alert Pain management: pain level controlled Vital Signs Assessment: post-procedure vital signs reviewed and stable Respiratory status: spontaneous breathing, nonlabored ventilation, respiratory function stable and patient connected to nasal cannula oxygen Cardiovascular status: blood pressure returned to baseline and stable Postop Assessment: no apparent nausea or vomiting Anesthetic complications: no    Last Vitals:  Vitals:   10/29/19 0858 10/29/19 1200  BP:  125/79  Pulse:  (!) 102  Resp:  (!) 33  Temp:  36.6 C  SpO2: 99% 92%    Last Pain:  Vitals:   10/29/19 1200  TempSrc: Oral  PainSc:                  Catalina Gravel

## 2019-10-30 ENCOUNTER — Inpatient Hospital Stay (HOSPITAL_COMMUNITY): Payer: Medicare Other

## 2019-10-30 LAB — COMPREHENSIVE METABOLIC PANEL
ALT: 18 U/L (ref 0–44)
AST: 22 U/L (ref 15–41)
Albumin: 3.7 g/dL (ref 3.5–5.0)
Alkaline Phosphatase: 68 U/L (ref 38–126)
Anion gap: 10 (ref 5–15)
BUN: 12 mg/dL (ref 8–23)
CO2: 27 mmol/L (ref 22–32)
Calcium: 9.7 mg/dL (ref 8.9–10.3)
Chloride: 101 mmol/L (ref 98–111)
Creatinine, Ser: 1.17 mg/dL (ref 0.61–1.24)
GFR calc Af Amer: >60 mL/min (ref >60)
GFR calc non Af Amer: >60 mL/min (ref >60)
Glucose, Bld: 124 mg/dL — ABNORMAL HIGH (ref 70–99)
Potassium: 3.6 mmol/L (ref 3.5–5.1)
Sodium: 138 mmol/L (ref 135–145)
Total Bilirubin: 0.6 mg/dL (ref 0.3–1.2)
Total Protein: 6.1 g/dL — ABNORMAL LOW (ref 6.5–8.1)

## 2019-10-30 LAB — CBC
HCT: 46.1 % (ref 39.0–52.0)
Hemoglobin: 15.5 g/dL (ref 13.0–17.0)
MCH: 31.4 pg (ref 26.0–34.0)
MCHC: 33.6 g/dL (ref 30.0–36.0)
MCV: 93.3 fL (ref 80.0–100.0)
Platelets: 205 10*3/uL (ref 150–400)
RBC: 4.94 MIL/uL (ref 4.22–5.81)
RDW: 14.3 % (ref 11.5–15.5)
WBC: 14.6 10*3/uL — ABNORMAL HIGH (ref 4.0–10.5)
nRBC: 0 % (ref 0.0–0.2)

## 2019-10-30 LAB — GLUCOSE, CAPILLARY
Glucose-Capillary: 111 mg/dL — ABNORMAL HIGH (ref 70–99)
Glucose-Capillary: 128 mg/dL — ABNORMAL HIGH (ref 70–99)
Glucose-Capillary: 116 mg/dL — ABNORMAL HIGH (ref 70–99)
Glucose-Capillary: 114 mg/dL — ABNORMAL HIGH (ref 70–99)

## 2019-10-30 MED ORDER — POTASSIUM CHLORIDE CRYS ER 20 MEQ PO TBCR
20.0000 meq | EXTENDED_RELEASE_TABLET | ORAL | Status: AC
  Administered 2019-10-30 (×3): 20 meq via ORAL
  Filled 2019-10-30 (×3): qty 1

## 2019-10-30 MED ORDER — FUROSEMIDE 40 MG PO TABS
40.0000 mg | ORAL_TABLET | Freq: Once | ORAL | Status: AC
  Administered 2019-10-30: 40 mg via ORAL
  Filled 2019-10-30: qty 1

## 2019-10-30 MED ORDER — LUNG SURGERY BOOK
Freq: Once | Status: AC
  Filled 2019-10-30: qty 1

## 2019-10-30 NOTE — Plan of Care (Signed)
  Problem: Education: Goal: Knowledge of disease or condition will improve Outcome: Progressing Goal: Knowledge of the prescribed therapeutic regimen will improve Outcome: Progressing   Problem: Activity: Goal: Risk for activity intolerance will decrease Outcome: Progressing   Problem: Cardiac: Goal: Will achieve and/or maintain hemodynamic stability Outcome: Progressing   Problem: Clinical Measurements: Goal: Postoperative complications will be avoided or minimized Outcome: Progressing   Problem: Respiratory: Goal: Respiratory status will improve Outcome: Progressing   Problem: Pain Management: Goal: Pain level will decrease Outcome: Progressing   Problem: Skin Integrity: Goal: Wound healing without signs and symptoms infection will improve Outcome: Progressing

## 2019-10-30 NOTE — Progress Notes (Addendum)
MD has placed chest tube to water seal----paged dr gold at 1815 to request cough medicine per patient request

## 2019-10-30 NOTE — Progress Notes (Signed)
Pt potassium 3.6 today. Per orders will  give po replacement x 3 doses.  Greg in pharmacy called to confirm override for replacement dose today since pt already on potassium citrate = ok to give.  Will continue to monitor pt.  Saunders Revel T

## 2019-10-30 NOTE — Progress Notes (Signed)
Patient ID: Chad Gomez, male   DOB: 1946/05/10, 74 y.o.   MRN: [id number] T[facility] DAILY ICU PROGRESS NOTE                   [address]            [place] [address]          [id number]   2 Days Post-Op Procedure(s) (LRB): VIDEO BRONCHOSCOPY WITH ENDOBRONCHIAL NAVIGATION WITH LUNG MARKINGS (N/A) XI ROBOTIC ASSISTED THORASCOPY WITH  LEFT UPPER LOBE LUNG WEDGE RESECTION (Left) Node Dissection Intercostal Nerve Block (Left)  Total Length of Stay:  LOS: 2 days   Subjective: Feels ok, minor sob with activity , as baseline   Objective: Vital signs in last 24 hours: Temp:  [97.8 F (36.6 C)-98.2 F (36.8 C)] 98.1 F (36.7 C) (05/09 0745) Pulse Rate:  [82-99] 97 (05/09 1000) Cardiac Rhythm: Normal sinus rhythm (05/09 1000) Resp:  [18-25] 20 (05/09 1000) BP: (118-150)/(80-92) 142/83 (05/09 1000) SpO2:  [91 %-99 %] 97 % (05/09 1000)  Filed Weights   10/28/19 1026  Weight: 82.6 kg    Weight change:    Hemodynamic parameters for last 24 hours:    Intake/Output from previous day: 05/08 0701 - 05/09 0700 In: 1468.1 [P.O.:1320; I.V.:148.1] Out: 4235 [Urine:2200; Stool:2000; Chest Tube:150]  Intake/Output this shift: Total I/O In: 480 [P.O.:480] Out: 500 [Urine:500]  Current Meds: Scheduled Meds: . acetaminophen  1,000 mg Oral Q6H   Or  . acetaminophen (TYLENOL) oral liquid 160 mg/5 mL  1,000 mg Oral Q6H  . amLODipine  10 mg Oral Daily  . arformoterol  15 mcg Nebulization BID  . aspirin EC  81 mg Oral Daily  . atorvastatin  40 mg Oral Daily  . bisacodyl  10 mg Oral Daily  . Chlorhexidine Gluconate Cloth  6 each Topical Daily  . enoxaparin (LOVENOX) injection  40 mg Subcutaneous Daily  . ezetimibe  5 mg Oral Daily  . finasteride  5 mg Oral Daily  . fluticasone  2 spray Each Nare Daily  . indapamide  1.25 mg Oral Daily  . insulin aspart  0-24 Units Subcutaneous TID AC  . loratadine  10 mg Oral Daily  . magnesium oxide  200 mg Oral Daily  .  metFORMIN  500 mg Oral Q breakfast   And  . metFORMIN  1,000 mg Oral Q supper  . potassium chloride  20 mEq Oral Q4H  . potassium citrate  10 mEq Oral BID  . senna-docusate  1 tablet Oral QHS  . tamsulosin  0.4 mg Oral BID  . umeclidinium bromide  1 puff Inhalation Daily   Continuous Infusions: . sodium chloride     PRN Meds:.Place/Maintain arterial line **AND** sodium chloride, albuterol, fentaNYL (SUBLIMAZE) injection, ipratropium-albuterol, ondansetron (ZOFRAN) IV, oxyCODONE, traMADol  General appearance: alert, cooperative and no distress Neurologic: intact Heart: regular rate and rhythm, S1, S2 normal, no murmur, click, rub or gallop Lungs: diminished breath sounds bibasilar Abdomen: soft, non-tender; bowel sounds normal; no masses,  no organomegaly Extremities: extremities normal, atraumatic, no cyanosis or edema and Homans sign is negative, no sign of DVT Wound: no air leak this am- to water seal   Lab Results: CBC: Recent Labs    10/29/19 0345 10/30/19 0300  WBC 12.4* 14.6*  HGB 14.4 15.5  HCT 42.5 46.1  PLT 170 205   BMET:  Recent Labs    10/29/19 0345 10/30/19 0300  NA 137 138  K 3.9 3.6  CL 100 101  CO2 26 27  GLUCOSE 169* 124*  BUN 10 12  CREATININE 1.11 1.17  CALCIUM 9.5 9.7    CMET: Lab Results  Component Value Date   WBC 14.6 (H) 10/30/2019   HGB 15.5 10/30/2019   HCT 46.1 10/30/2019   PLT 205 10/30/2019   GLUCOSE 124 (H) 10/30/2019   ALT 18 10/30/2019   AST 22 10/30/2019   NA 138 10/30/2019   K 3.6 10/30/2019   CL 101 10/30/2019   CREATININE 1.17 10/30/2019   BUN 12 10/30/2019   CO2 27 10/30/2019   INR 1.1 10/26/2019      PT/INR: No results for input(s): LABPROT, INR in the last 72 hours. Radiology: [facility] Chest Port 1 View  Result Date: 10/30/2019 CLINICAL DATA:  Recent pneumothorax EXAM: PORTABLE CHEST 1 VIEW COMPARISON:  Oct 29, 2019 FINDINGS: Left chest tube position unchanged. Previously noted pneumothorax on the left no longer  evident. There is persistent subcutaneous air on the left in the inferobasilar region. Central catheter tip is in the superior vena cava. There is atelectatic change in each lung base. No consolidation. Heart is upper normal in size with pulmonary vascularity normal. No adenopathy. There is aortic atherosclerosis. No appreciable bone lesions. IMPRESSION: Left apical pneumothorax no longer evident. Tube and catheter positions as described. Subcutaneous air noted in the inferolateral left base. Bibasilar atelectasis. Lungs elsewhere clear. Stable cardiac silhouette. Aortic Atherosclerosis (ICD10-I70.0). Electronically Signed   By: Lowella Grip III M.D.   On: 10/30/2019 09:24     Assessment/Plan: S/P Procedure(s) (LRB): VIDEO BRONCHOSCOPY WITH ENDOBRONCHIAL NAVIGATION WITH LUNG MARKINGS (N/A) XI ROBOTIC ASSISTED THORASCOPY WITH  LEFT UPPER LOBE LUNG WEDGE RESECTION (Left) Node Dissection Intercostal Nerve Block (Left) Mobilize Diuresis Ct to water seal     Chad Gomez 10/30/2019 12:04 PM

## 2019-10-31 ENCOUNTER — Inpatient Hospital Stay (HOSPITAL_COMMUNITY): Payer: Medicare Other

## 2019-10-31 LAB — GLUCOSE, CAPILLARY
Glucose-Capillary: 101 mg/dL — ABNORMAL HIGH (ref 70–99)
Glucose-Capillary: 100 mg/dL — ABNORMAL HIGH (ref 70–99)
Glucose-Capillary: 103 mg/dL — ABNORMAL HIGH (ref 70–99)
Glucose-Capillary: 107 mg/dL — ABNORMAL HIGH (ref 70–99)

## 2019-10-31 MED ORDER — DM-GUAIFENESIN ER 30-600 MG PO TB12
1.0000 | ORAL_TABLET | Freq: Two times a day (BID) | ORAL | Status: DC | PRN
  Administered 2019-10-31 – 2019-11-04 (×3): 1 via ORAL
  Filled 2019-10-31 (×3): qty 1

## 2019-10-31 NOTE — Hospital Course (Addendum)
HPI:  [name] is a 74 yo male with known history of COPD, HTN, Malignant Melanoma, H/O Kidney stones with chronic indwelling foley, PAD, and a lung nodule.  He has been routinely followed by the Pulmonary clinic for several years.  During that time he has been undergoing lung cancer screening with CT scans.  Unfortunately due to the COVID 19 pandemic he did not have his routine scan in 2020.  Follow up CT scan was performed in February of this year and showed a new multiloculated 2 cm lesion in the left upper lobe.  PET CT scan was obtained and confirmed the lesion to be hypermetabolic.  He underwent navigational bronchoscopy which confirmed non-small cell carcinoma of the lung.  His procedure was complicated with development of pneumothorax requiring chest tube placement.  He was subsequently referred to Dr. Servando Snare at [facility] for surgical evaluation.  He was evaluated by Dr. Servando Snare at which time he felt patient would be a candidate for robotic assisted resection of the lung mass.  The risks and benefits of the procedure were explained to the patient and he was agreeable to proceed.  Cardiac clearance was obtained prior to surgery.  He also had ultrasound of his thyroid due to a hypermetabolic thyroid nodule which was felt to not warrant further workup at this time.   Hospital Course:  Mr. [name] presented to [facility] on 10/28/2019.  He was taken to the operating room and underwent Video Bronchoscopy with navigation and lung markings,  Robotic Assisted VATS with wedge resection of left upper lobe lingula, lymph node dissection, and intercostal nerve block.  He tolerated the procedure without difficulty, was extubated and taken to the PACU in stable condition.  The patient did well post operatively.  His CXR was free from pneumothorax.  His chest tube was transitioned to water seal on 10/30/2019.  He developed an air leak with cough.  His CXR had some mild left sided subcutaneous emphysema.  This  increased some, but overall his CXR remained free from pneumothorax.  His air leak was still present at times.  It was felt due to the patient's underlying severe COPD he be placed on a Mini Express chest tube system.  This was done on 11/04/2019.  Follow up CXR showed ***. He has been hemodynamically stable on his home cardiac medications.  He has been restarted on his home diabetic medications and his sugars have been well controlled.  His severe COPD is at his baseline.  He has been weaned off oxygen prior to discharge. He refused Lovenox injections throughout his stay for DVT prophylaxis treatment. His incisions are free from evidence of infection.  He is medically stable for discharge home today.

## 2019-10-31 NOTE — Progress Notes (Addendum)
      [address]       ,[place] [address]             [phone]      3 Days Post-Op Procedure(s) (LRB): VIDEO BRONCHOSCOPY WITH ENDOBRONCHIAL NAVIGATION WITH LUNG MARKINGS (N/A) XI ROBOTIC ASSISTED THORASCOPY WITH  LEFT UPPER LOBE LUNG WEDGE RESECTION (Left) Node Dissection Intercostal Nerve Block (Left)   Subjective:  No new complaints.  Remains short of breath.  Would like some cough medicine.  Objective: Vital signs in last 24 hours: Temp:  [97.5 F (36.4 C)-98.1 F (36.7 C)] 97.7 F (36.5 C) (05/10 0723) Pulse Rate:  [91-115] 91 (05/10 0400) Cardiac Rhythm: Sinus tachycardia (05/10 0743) Resp:  [18-24] 20 (05/10 0754) BP: (113-142)/(66-87) 115/67 (05/10 0723) SpO2:  [91 %-99 %] 92 % (05/10 0400)  Intake/Output from previous day: 05/09 0701 - 05/10 0700 In: 720 [P.O.:720] Out: 1950 [Urine:1900; Chest Tube:50]  General appearance: alert, cooperative and no distress Heart: regular rate and rhythm Lungs: diminished breath sounds bilaterally Abdomen: soft, non-tender; bowel sounds normal; no masses,  no organomegaly Extremities: extremities normal, atraumatic, no cyanosis or edema Wound: clean and dry  Lab Results: Recent Labs    10/29/19 0345 10/30/19 0300  WBC 12.4* 14.6*  HGB 14.4 15.5  HCT 42.5 46.1  PLT 170 205   BMET:  Recent Labs    10/29/19 0345 10/30/19 0300  NA 137 138  K 3.9 3.6  CL 100 101  CO2 26 27  GLUCOSE 169* 124*  BUN 10 12  CREATININE 1.11 1.17  CALCIUM 9.5 9.7    PT/INR: No results for input(s): LABPROT, INR in the last 72 hours. ABG    Component Value Date/Time   PHART 7.407 10/29/2019 0430   HCO3 25.6 10/29/2019 0430   TCO2 33 (H) 10/28/2019 1501   ACIDBASEDEF 1.0 10/28/2019 1501   O2SAT 94.1 10/29/2019 0430   CBG (last 3)  Recent Labs    10/30/19 1633 10/30/19 2114 10/31/19 0612  GLUCAP 116* 114* 101*    Assessment/Plan: S/P Procedure(s) (LRB): VIDEO BRONCHOSCOPY WITH ENDOBRONCHIAL  NAVIGATION WITH LUNG MARKINGS (N/A) XI ROBOTIC ASSISTED THORASCOPY WITH  LEFT UPPER LOBE LUNG WEDGE RESECTION (Left) Node Dissection Intercostal Nerve Block (Left)  1. CV- NSR, BP controlled- on home Norvasc, Lozol 2. Pulm- CT in place on water seal, + air leak with cough... CXR no definitive pneumothorax, + sub q emphysema on left... leave to water seal today, I replaced ct dressing, will repeat CXR in AM, add mucinex for cough 3. GU- foley in place since prior to admission, will leave in place.. continue Proscar, Flomax 4. Lovenox for DVT prophylaxis 5. D/C Central line 6. Dispo- patient stable, + air leak/sub q emphysema.Marland Kitchen leave chest tube in place, repeat CXR in AM    LOS: 3 days    [name], PA-C 10/31/2019  Some increase air leak from yesterday  Back to suction on chest tube  -10 Add flutter valve  Path still pending  I have seen and examined Chad Gomez and agree with the above assessment  and plan.  [name] MD Beeper [phone] Office [phone] 10/31/2019 12:32 PM

## 2019-10-31 NOTE — Plan of Care (Signed)
  Problem: Education: Goal: Knowledge of disease or condition will improve Outcome: Progressing Goal: Knowledge of the prescribed therapeutic regimen will improve Outcome: Progressing   Problem: Activity: Goal: Risk for activity intolerance will decrease Outcome: Progressing   Problem: Cardiac: Goal: Will achieve and/or maintain hemodynamic stability Outcome: Progressing   Problem: Clinical Measurements: Goal: Postoperative complications will be avoided or minimized Outcome: Progressing   Problem: Respiratory: Goal: Respiratory status will improve Outcome: Progressing   Problem: Pain Management: Goal: Pain level will decrease Outcome: Progressing   Problem: Skin Integrity: Goal: Wound healing without signs and symptoms infection will improve Outcome: Progressing

## 2019-11-01 ENCOUNTER — Encounter (HOSPITAL_COMMUNITY): Payer: Self-pay | Admitting: Cardiothoracic Surgery

## 2019-11-01 ENCOUNTER — Inpatient Hospital Stay (HOSPITAL_COMMUNITY): Payer: Medicare Other

## 2019-11-01 DIAGNOSIS — C3410 Malignant neoplasm of upper lobe, unspecified bronchus or lung: Secondary | ICD-10-CM | POA: Diagnosis present

## 2019-11-01 HISTORY — DX: Malignant neoplasm of upper lobe, unspecified bronchus or lung: C34.10

## 2019-11-01 LAB — GLUCOSE, CAPILLARY
Glucose-Capillary: 101 mg/dL — ABNORMAL HIGH (ref 70–99)
Glucose-Capillary: 93 mg/dL (ref 70–99)
Glucose-Capillary: 105 mg/dL — ABNORMAL HIGH (ref 70–99)
Glucose-Capillary: 110 mg/dL — ABNORMAL HIGH (ref 70–99)

## 2019-11-01 LAB — SURGICAL PATHOLOGY

## 2019-11-01 NOTE — Plan of Care (Signed)
  Problem: Education: Goal: Knowledge of disease or condition will improve Outcome: Progressing Goal: Knowledge of the prescribed therapeutic regimen will improve Outcome: Progressing   Problem: Activity: Goal: Risk for activity intolerance will decrease Outcome: Progressing   Problem: Cardiac: Goal: Will achieve and/or maintain hemodynamic stability Outcome: Progressing   Problem: Clinical Measurements: Goal: Postoperative complications will be avoided or minimized Outcome: Progressing   Problem: Respiratory: Goal: Respiratory status will improve Outcome: Progressing   Problem: Pain Management: Goal: Pain level will decrease Outcome: Progressing   Problem: Skin Integrity: Goal: Wound healing without signs and symptoms infection will improve Outcome: Progressing

## 2019-11-01 NOTE — Progress Notes (Addendum)
      [address]       ,[place] [address]             [phone]      4 Days Post-Op Procedure(s) (LRB): VIDEO BRONCHOSCOPY WITH ENDOBRONCHIAL NAVIGATION WITH LUNG MARKINGS (N/A) XI ROBOTIC ASSISTED THORASCOPY WITH  LEFT UPPER LOBE LUNG WEDGE RESECTION (Left) Node Dissection Intercostal Nerve Block (Left) Subjective: Feels okay this morning. No complaints.   Objective: Vital signs in last 24 hours: Temp:  [97.5 F (36.4 C)-97.9 F (36.6 C)] 97.9 F (36.6 C) (05/11 0707) Pulse Rate:  [63-99] 63 (05/11 0432) Cardiac Rhythm: Normal sinus rhythm (05/10 2030) Resp:  [16-24] 20 (05/11 0707) BP: (102-141)/(63-92) 127/78 (05/11 0707) SpO2:  [91 %-100 %] 100 % (05/11 0432)     Intake/Output from previous day: 05/10 0701 - 05/11 0700 In: -  Out: 110 [Chest Tube:110] Intake/Output this shift: No intake/output data recorded.  General appearance: alert, cooperative and no distress Heart: regular rate and rhythm, S1, S2 normal, no murmur, click, rub or gallop Lungs: clear to auscultation bilaterally Abdomen: soft, non-tender; bowel sounds normal; no masses,  no organomegaly Extremities: extremities normal, atraumatic, no cyanosis or edema Wound: clean and dry  Lab Results: Recent Labs    10/30/19 0300  WBC 14.6*  HGB 15.5  HCT 46.1  PLT 205   BMET:  Recent Labs    10/30/19 0300  NA 138  K 3.6  CL 101  CO2 27  GLUCOSE 124*  BUN 12  CREATININE 1.17  CALCIUM 9.7    PT/INR: No results for input(s): LABPROT, INR in the last 72 hours. ABG    Component Value Date/Time   PHART 7.407 10/29/2019 0430   HCO3 25.6 10/29/2019 0430   TCO2 33 (H) 10/28/2019 1501   ACIDBASEDEF 1.0 10/28/2019 1501   O2SAT 94.1 10/29/2019 0430   CBG (last 3)  Recent Labs    10/31/19 1603 10/31/19 2105 11/01/19 0646  GLUCAP 103* 107* 101*    Assessment/Plan: S/P Procedure(s) (LRB): VIDEO BRONCHOSCOPY WITH ENDOBRONCHIAL NAVIGATION WITH LUNG MARKINGS (N/A) XI  ROBOTIC ASSISTED THORASCOPY WITH  LEFT UPPER LOBE LUNG WEDGE RESECTION (Left) Node Dissection Intercostal Nerve Block (Left)  1. CV- NSR, BP controlled- on home Norvasc, Lozol 2. Pulm- CT on 10 cm of suction, L subq air in the chest wall and neck. CXR reviewed. 3. GU- foley in place since prior to admission, will leave in place.. continue Proscar, Flomax 4. Lovenox for DVT prophylaxis 5. Surgical path still pending.   Plan: Keep chest tube to suction. Keep foley. Encouraged to ambulate and use IS/flutter valve.      LOS: 4 days    Elgie Collard 11/01/2019  Foley placed before admission chronic per urology Path pending  I have seen and examined Chad Gomez and agree with the above assessment  and plan.  [name] MD Beeper [phone] Office [phone] 11/01/2019 12:20 PM

## 2019-11-01 NOTE — Progress Notes (Signed)
Patient ambulated on the unit using front wheel walker, 400 ft tolerated very well.

## 2019-11-01 NOTE — Progress Notes (Signed)
Patient refusing Dulcolax and Lovenox injections.

## 2019-11-02 ENCOUNTER — Inpatient Hospital Stay (HOSPITAL_COMMUNITY): Payer: Medicare Other

## 2019-11-02 LAB — GLUCOSE, CAPILLARY
Glucose-Capillary: 90 mg/dL (ref 70–99)
Glucose-Capillary: 89 mg/dL (ref 70–99)
Glucose-Capillary: 85 mg/dL (ref 70–99)
Glucose-Capillary: 98 mg/dL (ref 70–99)

## 2019-11-02 NOTE — Progress Notes (Addendum)
      [address]       [place] [address]             [phone]      5 Days Post-Op Procedure(s) (LRB): VIDEO BRONCHOSCOPY WITH ENDOBRONCHIAL NAVIGATION WITH LUNG MARKINGS (N/A) XI ROBOTIC ASSISTED THORASCOPY WITH  LEFT UPPER LOBE LUNG WEDGE RESECTION (Left) Node Dissection Intercostal Nerve Block (Left) Subjective: No pain this morning. Walked in the halls yesterday without shortness of breath.   Objective: Vital signs in last 24 hours: Temp:  [97.5 F (36.4 C)-98.6 F (37 C)] 97.5 F (36.4 C) (05/12 0725) Pulse Rate:  [91-112] 91 (05/12 0725) Cardiac Rhythm: Normal sinus rhythm (05/12 0750) Resp:  [15-23] 17 (05/12 0725) BP: (110-147)/(83-88) 123/83 (05/12 0725) SpO2:  [94 %-100 %] 99 % (05/12 0743)     Intake/Output from previous day: 05/11 0701 - 05/12 0700 In: -  Out: 1750 [OTLXB:2620; Chest Tube:100] Intake/Output this shift: No intake/output data recorded.  General appearance: alert, cooperative and no distress Heart: regular rate and rhythm, S1, S2 normal, no murmur, click, rub or gallop Lungs: + crackles in bilateral lobes Abdomen: soft, non-tender; bowel sounds normal; no masses,  no organomegaly Extremities: extremities normal, atraumatic, no cyanosis or edema Wound: clean and dry  Lab Results: No results for input(s): WBC, HGB, HCT, PLT in the last 72 hours. BMET: No results for input(s): NA, K, CL, CO2, GLUCOSE, BUN, CREATININE, CALCIUM in the last 72 hours.  PT/INR: No results for input(s): LABPROT, INR in the last 72 hours. ABG    Component Value Date/Time   PHART 7.407 10/29/2019 0430   HCO3 25.6 10/29/2019 0430   TCO2 33 (H) 10/28/2019 1501   ACIDBASEDEF 1.0 10/28/2019 1501   O2SAT 94.1 10/29/2019 0430   CBG (last 3)  Recent Labs    11/01/19 1607 11/01/19 2108 11/02/19 0602  GLUCAP 105* 110* 90    Assessment/Plan: S/P Procedure(s) (LRB): VIDEO BRONCHOSCOPY WITH ENDOBRONCHIAL NAVIGATION WITH LUNG MARKINGS  (N/A) XI ROBOTIC ASSISTED THORASCOPY WITH  LEFT UPPER LOBE LUNG WEDGE RESECTION (Left) Node Dissection Intercostal Nerve Block (Left)   1. CV- NSR, BP controlled- on home Norvasc, Lozol 2. Pulm- CT on water seal, L subq air in the chest wall and neck. CXR reviewed and stable.  3. GU- foley in place since prior to admission, will leave in place.. continue Proscar, Flomax 4. Lovenox for DVT prophylaxis 5. Surgical path Invasive adenocarcinoma, 3.5 cm. Visceral pleura invasion is identified.  Margin of resection is not involved. Lymphovascular invasion is identified. See oncology table.   Plan: Continue water seal today. Ambulate TID today and encourage incentive spirometer.    LOS: 5 days    Elgie Collard 11/02/2019  was ordered to be on -10 cm suction , found on water seal this am Air leak with cough- may need miniexpress and poss home with chest tube  Discussed path with patient and referred to medical oncology I have seen and examined Zola Button and agree with the above assessment  and plan. Cancer Staging Lung cancer, lingula [facility]) Staging form: Lung, AJCC 8th Edition - Pathologic stage from 11/01/2019: Stage IIIA (pT2a, pN2, cM0) - Signed by [name], MD on 11/01/2019   [name] MD Beeper [phone] Office [phone] 11/02/2019 9:02 AM

## 2019-11-03 ENCOUNTER — Inpatient Hospital Stay (HOSPITAL_COMMUNITY): Payer: Medicare Other

## 2019-11-03 ENCOUNTER — Encounter: Payer: Self-pay | Admitting: *Deleted

## 2019-11-03 ENCOUNTER — Telehealth: Payer: Self-pay | Admitting: Physician Assistant

## 2019-11-03 DIAGNOSIS — C3410 Malignant neoplasm of upper lobe, unspecified bronchus or lung: Secondary | ICD-10-CM

## 2019-11-03 LAB — GLUCOSE, CAPILLARY
Glucose-Capillary: 108 mg/dL — ABNORMAL HIGH (ref 70–99)
Glucose-Capillary: 73 mg/dL (ref 70–99)
Glucose-Capillary: 102 mg/dL — ABNORMAL HIGH (ref 70–99)
Glucose-Capillary: 111 mg/dL — ABNORMAL HIGH (ref 70–99)

## 2019-11-03 NOTE — Progress Notes (Signed)
Patient ambulated on unit 300 ft using front wheel walker, patient tolerated very well.

## 2019-11-03 NOTE — Telephone Encounter (Signed)
Received a msg to schedule a new pt appt for Chad Gomez for new dx of lung cancer. I cld and spoke to the pt's wife and scheduled him to see Cassie on 5/19 at 1:30pm w/labs at 1pm. Pt aware to arrive 15 minutes early.

## 2019-11-03 NOTE — Progress Notes (Signed)
Received a referral on Chad Gomez. I notified new patient coordinator to call and schedule patient to be seen 11/04/19.

## 2019-11-03 NOTE — Progress Notes (Addendum)
      [address]       [place] [address]             [phone]      6 Days Post-Op Procedure(s) (LRB): VIDEO BRONCHOSCOPY WITH ENDOBRONCHIAL NAVIGATION WITH LUNG MARKINGS (N/A) XI ROBOTIC ASSISTED THORASCOPY WITH  LEFT UPPER LOBE LUNG WEDGE RESECTION (Left) Node Dissection Intercostal Nerve Block (Left) Subjective: Feels okay this morning.   Objective: Vital signs in last 24 hours: Temp:  [97.6 F (36.4 C)-98.6 F (37 C)] 98.2 F (36.8 C) (05/13 0710) Pulse Rate:  [92-95] 95 (05/13 0710) Cardiac Rhythm: Normal sinus rhythm (05/13 0450) Resp:  [18-20] 18 (05/13 0710) BP: (108-126)/(78-88) 116/88 (05/13 0710) SpO2:  [96 %-100 %] 100 % (05/13 0744)     Intake/Output from previous day: 05/12 0701 - 05/13 0700 In: 240 [P.O.:240] Out: 1028 [Urine:1000; Chest Tube:28] Intake/Output this shift: No intake/output data recorded.  General appearance: alert, cooperative and no distress Heart: regular rate and rhythm, S1, S2 normal, no murmur, click, rub or gallop Lungs: clear to auscultation bilaterally Abdomen: soft, non-tender; bowel sounds normal; no masses,  no organomegaly Extremities: extremities normal, atraumatic, no cyanosis or edema Wound: clean and dry  Lab Results: No results for input(s): WBC, HGB, HCT, PLT in the last 72 hours. BMET: No results for input(s): NA, K, CL, CO2, GLUCOSE, BUN, CREATININE, CALCIUM in the last 72 hours.  PT/INR: No results for input(s): LABPROT, INR in the last 72 hours. ABG    Component Value Date/Time   PHART 7.407 10/29/2019 0430   HCO3 25.6 10/29/2019 0430   TCO2 33 (H) 10/28/2019 1501   ACIDBASEDEF 1.0 10/28/2019 1501   O2SAT 94.1 10/29/2019 0430   CBG (last 3)  Recent Labs    11/02/19 1604 11/02/19 2102 11/03/19 0752  GLUCAP 85 98 108*    Assessment/Plan: S/P Procedure(s) (LRB): VIDEO BRONCHOSCOPY WITH ENDOBRONCHIAL NAVIGATION WITH LUNG MARKINGS (N/A) XI ROBOTIC ASSISTED THORASCOPY WITH  LEFT  UPPER LOBE LUNG WEDGE RESECTION (Left) Node Dissection Intercostal Nerve Block (Left)  1. CV- NSR, BP controlled- on home Norvasc, Lozol 2. Pulm- CTon water seal,L subq air in the chest wall and neck-might be a little more pronounced today.CXR reviewed and stable.  3. GU- foley in place since prior to admission, will leave in place.. continue Proscar, Flomax 4. Lovenox for DVT prophylaxis  Plan: Leave chest tube to water seal today. CXR in the morning. Continue ambulation in the halls and use of incentive spirometer.    LOS: 6 days    Elgie Collard 11/03/2019  I have seen and examined Chad Gomez and agree with the above assessment  and plan.  [name] MD Beeper [phone] Office [phone] 11/03/2019 11:44 AM

## 2019-11-03 NOTE — Plan of Care (Signed)
  Problem: Education: Goal: Knowledge of disease or condition will improve Outcome: Progressing Goal: Knowledge of the prescribed therapeutic regimen will improve Outcome: Progressing   Problem: Activity: Goal: Risk for activity intolerance will decrease Outcome: Progressing   Problem: Cardiac: Goal: Will achieve and/or maintain hemodynamic stability Outcome: Progressing   Problem: Clinical Measurements: Goal: Postoperative complications will be avoided or minimized Outcome: Progressing   Problem: Respiratory: Goal: Respiratory status will improve Outcome: Progressing   Problem: Pain Management: Goal: Pain level will decrease Outcome: Progressing   Problem: Skin Integrity: Goal: Wound healing without signs and symptoms infection will improve Outcome: Progressing

## 2019-11-03 NOTE — Op Note (Signed)
NAME: Chad Gomez, Chad Gomez. MEDICAL RECORD ZO:[id number] ACCOUNT [id number] DATE OF BIRTH:12-22-1945 FACILITY: [facility] LOCATION: [facility] PHYSICIAN:Jerie Basford Maryruth Bun, MD  OPERATIVE REPORT  DATE OF PROCEDURE:  10/28/2019  PREOPERATIVE DIAGNOSIS:  Non-small-cell lung cancer, left upper lobe lingula.  POSTOPERATIVE DIAGNOSIS:  Non-small-cell lung cancer, left upper lobe lingula.  SURGICAL PROCEDURES:   1.  Bronchoscopy with navigation bronchoscopy and marking of the left lung lesion. 2.  Robotic-assisted wedge resection of lingula with lymph node biopsy and intercostal nerve blocks.  SURGEON:  Lanelle Bal, MD  FIRST ASSISTANT:  [name], PA-C  BRIEF HISTORY:  The patient is a 74 year old male followed by the pulmonary service for severe COPD.  On routine scans, he was noted to have a lesion in the left upper lobe anteriorly.  This area was hypermetabolic.  CT suggested it was 2 adjacent  lesions.  A biopsy was done by the pulmonary service and was positive for non-small-cell lung cancer, but there was no sufficient tissue for any further testing.  The patient was evaluated for possible resection.  He had very limited pulmonary reserve.   However, because of the location of the mass being very closely adjacent to the heart, felt difficult to stereotactic radiotherapy would be a suitable treatment.  After discussing with the patient the risks and options and obtaining cardiac clearance,  the patient agreed to proceeding with resection.  DESCRIPTION OF PROCEDURE:  With a central line and arterial line in place, the patient underwent general endotracheal anesthesia without incident with a single lumen endotracheal tube.  Appropriate timeout was performed and we proceeded with video  bronchoscopy to the subsegmental level.  There were no endobronchial lesions appreciated.  We then loaded the preoperatively created navigation plan into the SuperD system, placed the appropriate sensors through  the scope, registered the plan and  navigated to the target lesion within 1 cm.  With a biopsy aspirating needle, 1 mL of ICG and methylene blue was injected for marking the lung.  With this completed, the scopes were removed.  A double lumen endotracheal tube was placed.  The patient was  turned in lateral decubitus position with the left side up.  The left chest was prepped with Betadine and draped in the usual sterile manner.  We then proceeded with placement of the ports in approximately the 8th intercostal space, posterior axillary  line.  Local anesthesia was infiltrated.  Using an Optiview port and a 0-degree camera, we carefully entered the left chest.  With confirmation of free pleural space, CO2 insufflation was started.  Then, under direct vision, we moved posteriorly, placing  an 8 mm port along the same intercostal space and then a 12 mm port approximately 4 cm from the camera.  A more anterior placed port was also placed under direct vision.  This was a 12 mm port.  An accessory 12 mm port was placed approximately 2  interspaces lower.  The [employer] robot was then docked to the camera.  Appropriate targeting was performed.  The remaining arms were docked and instruments placed with Cadiere, bipolar [place] tip up grasper.  We then proceeded with the procedure  through the console.  There was good visualization of the area that was injected.  There was some concern preoperatively that this lingula was adherent to the chest wall; however, the lingular portion of the lung was easily manipulated and was not  adherent to the chest wall.  Placing a blue stapler through the more posterior 12 mm port, we  were able to perform a wedge resection of the lingula and brought this specimen out through a specimen bag.  The specimen was examined and on palpation appeared  consistent with the CT findings.  There were 2 areas of thickening.  Each of these were labeled with a suture.  The specimen was sent for  frozen section.  This confirmed a single bilobular mass that was non-small-cell carcinoma.  The margin was  negative, but relatively close.  With this finding from the frozen section, we took an additional margin along the old staple line and submitted to pathology.  We then proceeded with lymph node dissection.  In the 4L area, a 2 cm lymph node was dissected  out of the mediastinum.  As we proceeded with dissection of additional lymph nodes, anesthesia had increasing difficulty with oxygenation and ventilation.  pCO2 rose as high as 90, so additional lymph node dissection was not carried out.  We then  undocked the robot, stopped CO2 inflation.  We then proceeded with intercostal nerve block with Exparel, Nupercaine, saline solution from the 3rd intercostal space to the 11th.  A single Blake chest tube was placed through the anterior port.  The  remaining ports were closed.  The lung reinflated.  The patient's respiratory status stabilized.  He was awakened and extubated in the operating room and transferred to the recovery room for postoperative observation.  Sponge and needle count was  reported as correct at completion of the procedure.  Blood loss was minimal.  The patient did not require blood bank blood products during the procedure.  VN/NUANCE  D:11/03/2019 T:11/03/2019 JOB:[id number]

## 2019-11-04 ENCOUNTER — Inpatient Hospital Stay (HOSPITAL_COMMUNITY): Payer: Medicare Other

## 2019-11-04 LAB — GLUCOSE, CAPILLARY
Glucose-Capillary: 89 mg/dL (ref 70–99)
Glucose-Capillary: 100 mg/dL — ABNORMAL HIGH (ref 70–99)
Glucose-Capillary: 171 mg/dL — ABNORMAL HIGH (ref 70–99)
Glucose-Capillary: 164 mg/dL — ABNORMAL HIGH (ref 70–99)

## 2019-11-04 MED ORDER — ACETAMINOPHEN 500 MG PO TABS
1000.0000 mg | ORAL_TABLET | Freq: Four times a day (QID) | ORAL | Status: DC | PRN
  Administered 2019-11-04: 1000 mg via ORAL

## 2019-11-04 MED ORDER — DM-GUAIFENESIN ER 30-600 MG PO TB12: 1.0000 | ORAL_TABLET | Freq: Two times a day (BID) | ORAL | Status: DC | PRN

## 2019-11-04 NOTE — Progress Notes (Addendum)
      [address]       [place] [address]             [phone]      7 Days Post-Op Procedure(s) (LRB): VIDEO BRONCHOSCOPY WITH ENDOBRONCHIAL NAVIGATION WITH LUNG MARKINGS (N/A) XI ROBOTIC ASSISTED THORASCOPY WITH  LEFT UPPER LOBE LUNG WEDGE RESECTION (Left) Node Dissection Intercostal Nerve Block (Left)   Subjective:  No new complaints.    Objective: Vital signs in last 24 hours: Temp:  [98 F (36.7 C)-98.5 F (36.9 C)] 98 F (36.7 C) (05/14 0709) Pulse Rate:  [88-95] 95 (05/14 0709) Cardiac Rhythm: Normal sinus rhythm (05/14 0413) Resp:  [16-20] 20 (05/14 0709) BP: (105-136)/(73-85) 136/85 (05/14 0709) SpO2:  [91 %-100 %] 97 % (05/14 0721)  Intake/Output from previous day: 05/13 0701 - 05/14 0700 In: 720 [P.O.:720] Out: 890 [Urine:850; Chest Tube:40] Intake/Output this shift: Total I/O In: -  Out: 200 [Urine:200]  General appearance: alert, cooperative and no distress Heart: regular rate and rhythm Lungs: coarse throughout Abdomen: soft, non-tender; bowel sounds normal; no masses,  no organomegaly Wound: clean and dry, sub q air present along left side  Lab Results: No results for input(s): WBC, HGB, HCT, PLT in the last 72 hours. BMET: No results for input(s): NA, K, CL, CO2, GLUCOSE, BUN, CREATININE, CALCIUM in the last 72 hours.  PT/INR: No results for input(s): LABPROT, INR in the last 72 hours. ABG    Component Value Date/Time   PHART 7.407 10/29/2019 0430   HCO3 25.6 10/29/2019 0430   TCO2 33 (H) 10/28/2019 1501   ACIDBASEDEF 1.0 10/28/2019 1501   O2SAT 94.1 10/29/2019 0430   CBG (last 3)  Recent Labs    11/03/19 1615 11/03/19 2117 11/04/19 0611  GLUCAP 102* 111* 89    Assessment/Plan: S/P Procedure(s) (LRB): VIDEO BRONCHOSCOPY WITH ENDOBRONCHIAL NAVIGATION WITH LUNG MARKINGS (N/A) XI ROBOTIC ASSISTED THORASCOPY WITH  LEFT UPPER LOBE LUNG WEDGE RESECTION (Left) Node Dissection Intercostal Nerve Block (Left)  1. CV-  NSR, BP stable- on home Norvasc, Lozol 2. Pulm- + air leak with cough on water seal, CXR with stable appearance of sub q emphysema.Marland Kitchen leave on water seal today 3. GU- chronic urinary retention, foley was in place prior to arriving to hospital.. DO NOT REMOVE, continue Proscar, flomax 4. Lovenox for DVT prophylaxis 5. Dispo- patient stable, air leak persists, sub q emphysema unchanged, repeat CXR in AM   LOS: 7 days    [name], PA-C  11/04/2019  Some decrease in sub q air at neck Chest xray not ptx  Place mini express and foley leg bag today  Poss home tomorrow with chest tube in place  Pa lat chest xray in am  I have seen and examined Chad Gomez and agree with the above assessment  and plan.  [name] MD Beeper [phone] Office [phone] 11/04/2019 2:11 PM

## 2019-11-04 NOTE — Discharge Summary (Signed)
Physician Discharge Summary  Patient ID: Chad Gomez MRN: [id number] DOB/AGE: 07-20-1945 74 y.o.  Admit date: 10/28/2019 Discharge date: 11/05/2019  Admission Diagnoses:  Patient Active Problem List   Diagnosis Date Noted  . Lung cancer, lingula ([place]) 11/01/2019  . Dyslipidemia 10/06/2019  . Type 2 diabetes mellitus with complication, without long-term current use of insulin ([place]) 10/06/2019  . Educated about COVID-19 virus infection 10/06/2019  . Tobacco abuse 09/06/2019  . Hypertension 09/06/2019  . Nephrolithiasis 09/06/2019  . Melanoma ([place]) 09/06/2019  . Centrilobular emphysema ([place]) 05/23/2019  . Stage 4 very severe COPD by GOLD classification ([place]) 05/23/2019  . Atherosclerosis of native artery of both lower extremities with intermittent claudication ([place]) 05/26/2016  . S/P aortobifemoral bypass surgery 05/26/2016  . Post-op pain 01/15/2015  . Aortoiliac occlusive disease ([place]) 12/28/2014  . Preop cardiovascular exam 11/24/2014  . Ureteral stone with hydronephrosis 03/21/2014  . Ureteral stone 03/21/2014  . Ulnar neuropathy 02/07/2014  . BP (high blood pressure) 02/07/2014  . Calculus of kidney 02/07/2014  . Compulsive tobacco user syndrome 02/07/2014  . Allergic rhinitis 02/07/2014  . Malignant melanoma ([place]) 02/07/2014  . Right ureteral stone 10/13/2013  . Bladder stone 10/13/2013   Discharge Diagnoses:  Patient Active Problem List   Diagnosis Date Noted  . Lung cancer, lingula ([place]) 11/01/2019  . S/P partial lobectomy of lung 10/28/2019  . Dyslipidemia 10/06/2019  . Type 2 diabetes mellitus with complication, without long-term current use of insulin ([place]) 10/06/2019  . Educated about COVID-19 virus infection 10/06/2019  . Tobacco abuse 09/06/2019  . Hypertension 09/06/2019  . Nephrolithiasis 09/06/2019  . Melanoma ([place]) 09/06/2019  . Centrilobular emphysema ([place]) 05/23/2019  . Stage 4 very severe COPD by GOLD classification ([place]) 05/23/2019  .  Atherosclerosis of native artery of both lower extremities with intermittent claudication ([place]) 05/26/2016  . S/P aortobifemoral bypass surgery 05/26/2016  . Post-op pain 01/15/2015  . Aortoiliac occlusive disease ([place]) 12/28/2014  . Preop cardiovascular exam 11/24/2014  . Ureteral stone with hydronephrosis 03/21/2014  . Ureteral stone 03/21/2014  . Ulnar neuropathy 02/07/2014  . BP (high blood pressure) 02/07/2014  . Calculus of kidney 02/07/2014  . Compulsive tobacco user syndrome 02/07/2014  . Allergic rhinitis 02/07/2014  . Malignant melanoma ([place]) 02/07/2014  . Right ureteral stone 10/13/2013  . Bladder stone 10/13/2013   Discharged Condition: good  HPI:  Mr. Rogerson is a 74 yo male with known history of COPD, HTN, Malignant Melanoma, H/O Kidney stones with chronic indwelling foley, PAD, and a lung nodule.  He has been routinely followed by the Pulmonary clinic for several years.  During that time he has been undergoing lung cancer screening with CT scans.  Unfortunately due to the COVID 19 pandemic he did not have his routine scan in 2020.  Follow up CT scan was performed in February of this year and showed a new multiloculated 2 cm lesion in the left upper lobe.  PET CT scan was obtained and confirmed the lesion to be hypermetabolic.  He underwent navigational bronchoscopy which confirmed non-small cell carcinoma of the lung.  His procedure was complicated with development of pneumothorax requiring chest tube placement.  He was subsequently referred to Dr. Servando Snare at [facility] for surgical evaluation.  He was evaluated by Dr. Servando Snare at which time he felt patient would be a candidate for robotic assisted resection of the lung mass.  The risks and benefits of the procedure were explained to the patient and he was agreeable to proceed.  Cardiac  clearance was obtained prior to surgery.  He also had ultrasound of his thyroid due to a hypermetabolic thyroid nodule which was felt to not warrant  further workup at this time.   Hospital Course:  Mr. Kappes presented to [facility] on 10/28/2019.  He was taken to the operating room and underwent Video Bronchoscopy with navigation and lung markings,  Robotic Assisted VATS with wedge resection of left upper lobe lingula, lymph node dissection, and intercostal nerve block.  He tolerated the procedure without difficulty, was extubated and taken to the PACU in stable condition.  The patient did well post operatively.  His CXR was free from pneumothorax.  His chest tube was transitioned to water seal on 10/30/2019.  He developed an air leak with cough.  His CXR had some mild left sided subcutaneous emphysema.  This increased some, but overall his CXR remained free from pneumothorax.  His air leak was still present at times.  It was felt due to the patient's underlying severe COPD he be placed on a Mini Express chest tube system.  This was done on 11/04/2019.  Follow up CXR showed stable appearance of sub q emphysema and no significant pneumothorax. He has been hemodynamically stable on his home cardiac medications.  He has been restarted on his home diabetic medications and his sugars have been well controlled.  His severe COPD is at his baseline.  He has been weaned off oxygen prior to discharge. He refused Lovenox injections throughout his stay for DVT prophylaxis treatment. His incisions are free from evidence of infection.  He is medically stable for discharge home today.    Significant Diagnostic Studies: nuclear medicine:   1. Intensely hypermetabolic (max SUV 65.4) irregular 2.8 x 2.5 cm solid pulmonary nodule in the anteromedial left upper lobe, worrisome for primary bronchogenic carcinoma. 2. No hypermetabolic thoracic adenopathy or distant metastatic disease. PET-CT stage IA (T1c N0 M0). 3. Mildly hypermetabolic 1.0 cm anterior left thyroid nodule. Recommend thyroid US and biopsy (ref: J Am Coll Radiol. 2015 Feb;12(2): 143-50). 4.  Mild hypermetabolism associated with small 1.0 cm superficial subcutaneous cystic lesion in the posterior right shoulder, favoring an inflammatory lesion such as a sebaceous cyst. 5. Chronic findings include: Aortic Atherosclerosis (ICD10-I70.0) and Emphysema (ICD10-J43.9). Coronary atherosclerosis. Small hiatal hernia. Cholelithiasis. Marked prostatomegaly.   Pathology:  FINAL MICROSCOPIC DIAGNOSIS:   A. LUNG, LEFT UPPER LOBE, WEDGE RESECTION:  - Invasive adenocarcinoma, 3.5 cm.  - Visceral pleura invasion is identified.  - Margin of resection is not involved.  - Lymphovascular invasion is identified.  - See oncology table.   B. LYMPH NODE, 4L, BIOPSY:  - Metastatic carcinoma in (1) of (1) lymph node.   C. LUNG, LEFT UPPER LOBE, FINAL MARGIN, WEDGE RESECTION:  - Lung tissue, negative for carcinoma.   Treatments: surgery:   SURGICAL PROCEDURES:   1.  Bronchoscopy with navigation bronchoscopy and marking of the left lung lesion. 2.  Robotic-assisted wedge resection of lingula with lymph node biopsy and intercostal nerve blocks.  Discharge Exam: Blood pressure (!) 119/93, pulse 98, temperature 97.6 F (36.4 C), temperature source Oral, resp. rate 20, height 5\' 10"  (1.778 m), weight 82.6 kg, SpO2 94 %.  General appearance: alert, cooperative and no distress Heart: regular rate and rhythm Lungs: clear to auscultation bilaterally Abdomen: soft, non-tender; bowel sounds normal; no masses,  no organomegaly Extremities: extremities normal, atraumatic, no cyanosis or edema Wound: clean and dry  Discharge Medications:   Allergies as of 11/05/2019  Reactions   Morphine And Related Anxiety, Other (See Comments)   Combative, incoherent     Tape Other (See Comments), Rash   Surgical tape Adhesive tape causes blisters   Amoxicillin Other (See Comments)   Severe stomach pain   Naproxen Swelling   Eyes swell      Medication List    STOP taking these medications    ciprofloxacin 500 MG tablet Commonly known as: Cipro     TAKE these medications   acetaminophen 500 MG tablet Commonly known as: TYLENOL Take 2 tablets (1,000 mg total) by mouth every 6 (six) hours as needed for mild pain.   albuterol 108 (90 Base) MCG/ACT inhaler Commonly known as: VENTOLIN HFA Inhale 2 puffs into the lungs every 6 (six) hours as needed for wheezing or shortness of breath.   amLODipine 10 MG tablet Commonly known as: NORVASC Take 10 mg by mouth daily.   aspirin EC 81 MG tablet Take 81 mg by mouth daily.   atorvastatin 40 MG tablet Commonly known as: LIPITOR Take 40 mg by mouth daily.   cetirizine 10 MG tablet Commonly known as: ZYRTEC Take 10 mg by mouth daily.   dextromethorphan-guaiFENesin 30-600 MG 12hr tablet Commonly known as: MUCINEX DM Take 1 tablet by mouth 2 (two) times daily as needed for cough.   ezetimibe 10 MG tablet Commonly known as: ZETIA Take 5 mg by mouth daily.   finasteride 5 MG tablet Commonly known as: PROSCAR Take 5 mg by mouth daily.   fluticasone 50 MCG/ACT nasal spray Commonly known as: FLONASE Place 2 sprays into both nostrils daily.   indapamide 1.25 MG tablet Commonly known as: LOZOL Take 1.25 mg by mouth daily.   Magnesium 250 MG Tabs Take 250 mg by mouth daily.   metFORMIN 500 MG 24 hr tablet Commonly known as: GLUCOPHAGE-XR Take 500-1,000 mg by mouth See admin instructions. Take 500 mg every morning and take 1000 mg at supper   potassium citrate 10 MEQ (1080 MG) SR tablet Commonly known as: UROCIT-K Take 10 mEq by mouth in the morning and at bedtime.   tamsulosin 0.4 MG Caps capsule Commonly known as: FLOMAX Take 0.4 mg by mouth in the morning and at bedtime.   Tiotropium Bromide-Olodaterol 2.5-2.5 MCG/ACT Aers Commonly known as: Stiolto Respimat Inhale 2 puffs into the lungs daily.   traMADol 50 MG tablet Commonly known as: ULTRAM Take 1 tablet (50 mg total) by mouth every 6 (six) hours as needed  (mild pain).   Vitamin D-3 25 MCG (1000 UT) Caps Take 1,000 Units by mouth daily.      Follow-up Information    [name], MD Follow up.   Specialty: Cardiothoracic Surgery Why: Your follow-up appointment is on 11/17/2019 at 4:00pm. Please arrive at 3:30 for a chest xray located at [facility] which is on the first floor of our building Contact information: [place] [address] [phone]           Signed:  [name], PA-C  11/05/2019, 9:52 AM

## 2019-11-04 NOTE — Plan of Care (Signed)
  Problem: Education: Goal: Knowledge of the prescribed therapeutic regimen will improve Outcome: Progressing   Problem: Cardiac: Goal: Will achieve and/or maintain hemodynamic stability Outcome: Progressing   Problem: Respiratory: Goal: Respiratory status will improve Outcome: Progressing   Problem: Pain Management: Goal: Pain level will decrease Outcome: Progressing   Problem: Skin Integrity: Goal: Wound healing without signs and symptoms infection will improve Outcome: Progressing   Problem: Education: Goal: Knowledge of disease or condition will improve Outcome: Completed/Met   Problem: Activity: Goal: Risk for activity intolerance will decrease Outcome: Completed/Met   Problem: Clinical Measurements: Goal: Postoperative complications will be avoided or minimized Outcome: Completed/Met

## 2019-11-04 NOTE — Plan of Care (Signed)
  Problem: Education: Goal: Knowledge of the prescribed therapeutic regimen will improve Outcome: Progressing   Problem: Respiratory: Goal: Respiratory status will improve Outcome: Progressing   Problem: Skin Integrity: Goal: Wound healing without signs and symptoms infection will improve Outcome: Progressing   Problem: Cardiac: Goal: Will achieve and/or maintain hemodynamic stability Outcome: Progressing

## 2019-11-04 NOTE — Progress Notes (Signed)
Changed chest tube sahara to a mini express per orders

## 2019-11-05 ENCOUNTER — Inpatient Hospital Stay (HOSPITAL_COMMUNITY): Payer: Medicare Other

## 2019-11-05 LAB — GLUCOSE, CAPILLARY: Glucose-Capillary: 133 mg/dL — ABNORMAL HIGH (ref 70–99)

## 2019-11-05 MED ORDER — ACETAMINOPHEN 500 MG PO TABS: 1000.0000 mg | ORAL_TABLET | Freq: Four times a day (QID) | ORAL | 0 refills | Status: DC | PRN

## 2019-11-05 MED ORDER — TRAMADOL HCL 50 MG PO TABS: 50.0000 mg | ORAL_TABLET | Freq: Four times a day (QID) | ORAL | 0 refills | Status: DC | PRN

## 2019-11-05 NOTE — Progress Notes (Signed)
      [address]       [place] [address]             [phone]      8 Days Post-Op Procedure(s) (LRB): VIDEO BRONCHOSCOPY WITH ENDOBRONCHIAL NAVIGATION WITH LUNG MARKINGS (N/A) XI ROBOTIC ASSISTED THORASCOPY WITH  LEFT UPPER LOBE LUNG WEDGE RESECTION (Left) Node Dissection Intercostal Nerve Block (Left)   Subjective:  No new complaints.  States they are having difficulty locating a leg bag for his foley.  States his wife is going to get him one.  He feels the sub q air has improved further.  Objective: Vital signs in last 24 hours: Temp:  [97.4 F (36.3 C)-98.5 F (36.9 C)] 97.6 F (36.4 C) (05/15 0747) Pulse Rate:  [90-118] 98 (05/15 0747) Cardiac Rhythm: Normal sinus rhythm (05/15 0729) Resp:  [15-20] 20 (05/15 0747) BP: (105-127)/(66-93) 119/93 (05/15 0747) SpO2:  [93 %-100 %] 94 % (05/15 0747)  Intake/Output from previous day: 05/14 0701 - 05/15 0700 In: 720 [P.O.:720] Out: 815 [Urine:775; Chest Tube:40]  General appearance: alert, cooperative and no distress Heart: regular rate and rhythm Lungs: clear to auscultation bilaterally Abdomen: soft, non-tender; bowel sounds normal; no masses,  no organomegaly Extremities: extremities normal, atraumatic, no cyanosis or edema Wound: clean and dry  Lab Results: No results for input(s): WBC, HGB, HCT, PLT in the last 72 hours. BMET: No results for input(s): NA, K, CL, CO2, GLUCOSE, BUN, CREATININE, CALCIUM in the last 72 hours.  PT/INR: No results for input(s): LABPROT, INR in the last 72 hours. ABG    Component Value Date/Time   PHART 7.407 10/29/2019 0430   HCO3 25.6 10/29/2019 0430   TCO2 33 (H) 10/28/2019 1501   ACIDBASEDEF 1.0 10/28/2019 1501   O2SAT 94.1 10/29/2019 0430   CBG (last 3)  Recent Labs    11/04/19 1612 11/04/19 2134 11/05/19 0618  GLUCAP 171* 164* 133*    Assessment/Plan: S/P Procedure(s) (LRB): VIDEO BRONCHOSCOPY WITH ENDOBRONCHIAL NAVIGATION WITH LUNG MARKINGS  (N/A) XI ROBOTIC ASSISTED THORASCOPY WITH  LEFT UPPER LOBE LUNG WEDGE RESECTION (Left) Node Dissection Intercostal Nerve Block (Left)  1. CV- hemodynamically stable on Norvasc, Lozol 2. Pulm- + air leak with cough, CT on Mini Express... CXR is stable  3. GU- chronic urinary retention, foley is to remain in place. Outpatient care per urology 4. Dispo- patient stable, will plan to d/c home today... he will f/u with Dr. Servando Snare in 1 week with repeat CXR    LOS: 8 days    [name], PA-C  11/05/2019

## 2019-11-05 NOTE — Discharge Instructions (Signed)
Please report to the Emergency Department if you develop worsening swelling along left chest, neck, and face    Video-Assisted Thoracic Surgery, Care After  This sheet gives you information about how to care for yourself after your procedure. Your health care provider may also give you more specific instructions. If you have problems or questions, contact your health care provider. What can I expect after the procedure? After the procedure, it is common to have:  Some pain and soreness in your chest.  Pain when breathing in (inhaling) and coughing.  Constipation.  Fatigue.  Difficulty sleeping. Follow these instructions at home: Preventing pneumonia  Take deep breaths or do breathing exercises as instructed by your health care provider. Doing this helps prevent lung infection (pneumonia).  Cough frequently. Coughing may cause discomfort, but it is important to clear mucus (phlegm) and expand your lungs. If it hurts to cough, hold a pillow against your chest or place the palms of both hands on top of the incision (use splinting) when you cough. This may help relieve discomfort.  If you were given an incentive spirometer, use it as directed. An incentive spirometer is a tool that measures how well you are filling your lungs with each breath.  Participate in pulmonary rehabilitation as directed by your health care provider. This is a program that combines education, exercise, and support from a team of specialists. The goal is to help you heal and get back to your normal activities as soon as possible. Medicines  Take over-the-counter or prescription medicines only as told by your health care provider.  If you have pain, take pain-relieving medicine before your pain becomes severe. This is important because if your pain is under control, you will be able to breathe and cough more comfortably.  If you were prescribed an antibiotic medicine, take it as told by your health care provider.  Do not stop taking the antibiotic even if you start to feel better. Activity  Ask your health care provider what activities are safe for you.  Avoid activities that use your chest muscles for at least 3-4 weeks.  Do not lift anything that is heavier than 10 lb (4.5 kg), or the limit that your health care provider tells you, until he or she says that it is safe. Incision care  Follow instructions from your health care provider about how to take care of your incision(s). Make sure you: ? Wash your hands with soap and water before you change your bandage (dressing). If soap and water are not available, use hand sanitizer. ? Change your dressing as told by your health care provider. ? Leave stitches (sutures), skin glue, or adhesive strips in place. These skin closures may need to stay in place for 2 weeks or longer. If adhesive strip edges start to loosen and curl up, you may trim the loose edges. Do not remove adhesive strips completely unless your health care provider tells you to do that.  Keep your dressing dry until it has been removed.  Check your incision area every day for signs of infection. Check for: ? Redness, swelling, or pain. ? Fluid or blood. ? Warmth. ? Pus or a bad smell. Bathing  Do not take baths, swim, or use a hot tub until your health care provider approves. You may take showers.  After your dressing has been removed, use soap and water to gently wash your incision area. Do not use anything else to clean your incision(s) unless your health care provider tells  you to do this. Driving   Do not drive until your health care provider approves.  Do not drive or use heavy machinery while taking prescription pain medicine. Eating and drinking  Eat a healthy, balanced diet as instructed by your health care provider. A healthy diet includes plenty of fresh fruits and vegetables, whole grains, and low-fat (lean) proteins.  Limit foods that are high in fat and processed  sugars, such as fried and sweet foods.  Drink enough fluid to keep your urine clear or pale yellow. General instructions   To prevent or treat constipation while you are taking prescription pain medicine, your health care provider may recommend that you: ? Take over-the-counter or prescription medicines. ? Eat foods that are high in fiber, such as beans, fresh fruits and vegetables, and whole grains.  Do not use any products that contain nicotine or tobacco, such as cigarettes and e-cigarettes. If you need help quitting, ask your health care provider.  Avoid secondhand smoke.  Wear compression stockings as told by your health care provider. These stockings help to prevent blood clots and reduce swelling in your legs.  If you have a chest tube, care for it as instructed by your health care provider. Do not travel by airplane during the 2 weeks after your chest tube is removed, or until your health care provider says that this is safe.  Keep all follow-up visits as told by your health care provider. This is important. Contact a health care provider if:  You have redness, swelling, or pain around an incision.  You have fluid or blood coming from an incision.  Your incision area feels warm to the touch.  You have pus or a bad smell coming from an incision.  You have a fever or chills.  You have nausea or vomiting.  You have pain that does not get better with medicine. Get help right away if:  You have chest pain.  Your heart is fluttering or beating rapidly.  You develop a rash.  You have shortness of breath or trouble breathing.  You are confused.  You have trouble speaking.  You feel weak, light-headed, or dizzy.  You faint. Summary  To help prevent lung infection (pneumonia), take deep breaths or do breathing exercises as instructed by your health care provider.  Cough frequently to clear mucus (phlegm) and expand your lungs. If it hurts to cough, hold a pillow  against your chest or place the palms of both hands on top of the incision (use splinting) when you cough.  If you have pain, take pain-relieving medicine before your pain becomes severe. This is important because if your pain is under control, you will be able to breathe and cough more comfortably.  Ask your health care provider what activities are safe for you. This information is not intended to replace advice given to you by your health care provider. Make sure you discuss any questions you have with your health care provider. Document Revised: 05/22/2017 Document Reviewed: 05/19/2016 Elsevier Patient Education  2020 [employer].

## 2019-11-05 NOTE — Progress Notes (Signed)
Discussed and explained discharge instructions, made aware of follow up appt., prescription sent electronically to [facility]. Extra dressing supply given for chest tube dressing change if needed. Pt getting dress and waiting on ride home from wife.

## 2019-11-05 NOTE — Plan of Care (Signed)
  Problem: Education: Goal: Knowledge of the prescribed therapeutic regimen will improve Outcome: Completed/Met   Problem: Cardiac: Goal: Will achieve and/or maintain hemodynamic stability Outcome: Completed/Met   Problem: Respiratory: Goal: Respiratory status will improve Outcome: Completed/Met   Problem: Pain Management: Goal: Pain level will decrease Outcome: Completed/Met   Problem: Skin Integrity: Goal: Wound healing without signs and symptoms infection will improve Outcome: Completed/Met

## 2019-11-08 DIAGNOSIS — R338 Other retention of urine: Secondary | ICD-10-CM | POA: Diagnosis not present

## 2019-11-09 ENCOUNTER — Encounter: Payer: Self-pay | Admitting: Physician Assistant

## 2019-11-09 ENCOUNTER — Inpatient Hospital Stay: Payer: Medicare Other

## 2019-11-09 ENCOUNTER — Other Ambulatory Visit: Payer: Self-pay | Admitting: Cardiothoracic Surgery

## 2019-11-09 ENCOUNTER — Telehealth: Payer: Self-pay | Admitting: Physician Assistant

## 2019-11-09 ENCOUNTER — Encounter: Payer: Self-pay | Admitting: *Deleted

## 2019-11-09 ENCOUNTER — Other Ambulatory Visit: Payer: Self-pay

## 2019-11-09 ENCOUNTER — Inpatient Hospital Stay: Payer: Medicare Other | Attending: Physician Assistant | Admitting: Physician Assistant

## 2019-11-09 VITALS — BP 124/91 | HR 95 | Temp 97.8°F | Resp 18 | Ht 70.0 in | Wt 175.1 lb

## 2019-11-09 DIAGNOSIS — C3492 Malignant neoplasm of unspecified part of left bronchus or lung: Secondary | ICD-10-CM

## 2019-11-09 DIAGNOSIS — F40240 Claustrophobia: Secondary | ICD-10-CM | POA: Diagnosis not present

## 2019-11-09 DIAGNOSIS — F17210 Nicotine dependence, cigarettes, uncomplicated: Secondary | ICD-10-CM | POA: Diagnosis not present

## 2019-11-09 DIAGNOSIS — Z7189 Other specified counseling: Secondary | ICD-10-CM

## 2019-11-09 DIAGNOSIS — C3410 Malignant neoplasm of upper lobe, unspecified bronchus or lung: Secondary | ICD-10-CM

## 2019-11-09 DIAGNOSIS — C3412 Malignant neoplasm of upper lobe, left bronchus or lung: Secondary | ICD-10-CM | POA: Diagnosis not present

## 2019-11-09 DIAGNOSIS — Z902 Acquired absence of lung [part of]: Secondary | ICD-10-CM | POA: Diagnosis not present

## 2019-11-09 LAB — CMP (CANCER CENTER ONLY)
ALT: 14 U/L (ref 0–44)
AST: 14 U/L — ABNORMAL LOW (ref 15–41)
Albumin: 3.4 g/dL — ABNORMAL LOW (ref 3.5–5.0)
Alkaline Phosphatase: 93 U/L (ref 38–126)
Anion gap: 9 (ref 5–15)
BUN: 19 mg/dL (ref 8–23)
CO2: 30 mmol/L (ref 22–32)
Calcium: 10.3 mg/dL (ref 8.9–10.3)
Chloride: 96 mmol/L — ABNORMAL LOW (ref 98–111)
Creatinine: 1.09 mg/dL (ref 0.61–1.24)
GFR, Est AFR Am: >60 mL/min (ref >60)
GFR, Estimated: >60 mL/min (ref >60)
Glucose, Bld: 126 mg/dL — ABNORMAL HIGH (ref 70–99)
Potassium: 4.2 mmol/L (ref 3.5–5.1)
Sodium: 135 mmol/L (ref 135–145)
Total Bilirubin: 0.5 mg/dL (ref 0.3–1.2)
Total Protein: 6.2 g/dL — ABNORMAL LOW (ref 6.5–8.1)

## 2019-11-09 LAB — CBC WITH DIFFERENTIAL (CANCER CENTER ONLY)
Abs Immature Granulocytes: 0.04 10*3/uL (ref 0.00–0.07)
Basophils Absolute: 0 10*3/uL (ref 0.0–0.1)
Basophils Relative: 0 %
Eosinophils Absolute: 0.7 10*3/uL — ABNORMAL HIGH (ref 0.0–0.5)
Eosinophils Relative: 8 %
HCT: 48.5 % (ref 39.0–52.0)
Hemoglobin: 16.4 g/dL (ref 13.0–17.0)
Immature Granulocytes: 0 %
Lymphocytes Relative: 17 %
Lymphs Abs: 1.6 10*3/uL (ref 0.7–4.0)
MCH: 31.1 pg (ref 26.0–34.0)
MCHC: 33.8 g/dL (ref 30.0–36.0)
MCV: 92 fL (ref 80.0–100.0)
Monocytes Absolute: 0.5 10*3/uL (ref 0.1–1.0)
Monocytes Relative: 6 %
Neutro Abs: 6.3 10*3/uL (ref 1.7–7.7)
Neutrophils Relative %: 69 %
Platelet Count: 273 10*3/uL (ref 150–400)
RBC: 5.27 MIL/uL (ref 4.22–5.81)
RDW: 13.7 % (ref 11.5–15.5)
WBC Count: 9.1 10*3/uL (ref 4.0–10.5)
nRBC: 0 % (ref 0.0–0.2)

## 2019-11-09 MED ORDER — LORAZEPAM 0.5 MG PO TABS: 0.5000 mg | ORAL_TABLET | Freq: Three times a day (TID) | ORAL | 0 refills | Status: DC

## 2019-11-09 NOTE — Patient Instructions (Addendum)
-  There are two main categories of lung cancer, they are named based on the size of the cancer cell. One is called Non-Small cell lung cancer. The other type is Small Cell Lung Cancer -The sample (biopsy) that they took of your tumor was consistent with a subtype of Non-small cell lung cancer called Adenocarcinoma. This is the most common type. It is stage IIIA -We covered a lot of important information at your appointment today regarding what the treatment plan is moving forward. Here are the the main points that were discussed at your office visit with [place] today:  -We will have you meet with a member of the research team to discussed testing your cancer to see if there are any markers to see if you are a candidate for a treatment that is a pill -It takes a few weeks to get the results of that back. We will see you in 3 weeks to discuss the results and to have a more detailed discussion about the options.  -I will put information about the standard treatment below, but just know this is if you are not a candidate for the study, in which case we will discuss this in more detail at your next appointment as well. We generally do not start any treatment until 6-8 weeks after your surgery to allow time to recover.   Imaging:  -I would like you to have a brain MRI to complete the staging work up. I have ordered this a  imaging and requested an open MRI machine. Make sure to remind the scheduler that you would like the open MRI machine.   Follow Up:  -We will see you back for a follow up visit in 3 weeks

## 2019-11-09 NOTE — Research (Signed)
11/09/2019   Research - [facility] [phone] ALCHEMIST study   Met with patient and wife briefly today to provide an overview of the [facility] [phone] ALCHEMIST study. Patient was given copies of the consent and authorization forms, along with the [facility] brochure and my business card. Patient understands the initial portion of the study involves central lab testing, and that depending on the results, he may qualify for a treatment trial that will be discussed with him further for his consideration. Patient is aware that participation in the [facility] screening study and any qualifying treatment sub-studies is voluntary; he may choose to participate or not and is free to withdraw his participation at any time. Patient is aware of the risks related to use of his personal health information, understanding that research personnel limit those risks by providing only necessary information and in any case, no full identifying information. Patient will review the information over the next week, and in the meantime, the research nurse will review the patient's information to see if he qualifies for the [facility] research study. Patient is expected to return to the clinic in 3 weeks, with further treatment plans dependent upon ALCHEMIST participation and results.  Cindy S. Brigitte Pulse BSN, RN, [place] 11/09/2019 2:57 PM

## 2019-11-09 NOTE — Progress Notes (Addendum)
[place] Telephone:([phone]   [phone]  CONSULT NOTE  REFERRING PHYSICIAN: Dr. Servando Snare  REASON FOR CONSULTATION:  Stage IIIA (T2a, N2, M0) Non-small cell lung cancer, adenocarcinoma  HPI Chad Gomez is a 74 y.o. male with a past medical history significant for hypertension, aortoiliac occlusive disease status post aorto bifemoral bypass surgery, allergic rhinitis, severe central lobar emphysema, diabetes mellitus, nephrolithiasis, ulnar neuropathy, malignant melanoma, tobacco abuse, and dyslipidemia is referred to the clinic for evaluation of non-small cell lung cancer, adenocarcinoma.  The patient is part of the lung cancer screening program.  On August 01, 2019 the patient had a annual low-dose CT scan which noted a bilobed irregular 2.6 cm lesion in the medial left upper lobe.  Patient subsequently had a PET scan on August 16, 2019 to further evaluate this lesion which noted a 2.8x2.5 left upper lobe intensely hypermetabolic lesion without any evidence of lymphadenopathy or metastatic disease.  The patient also had an incidental finding of a 1 cm left hypermetabolic thyroid nodule, which has been evaluated by a ultrasound of the thyroid and did not meet criteria for biopsy.   The patient had a navigational bronchoscopy under the care of Dr. Lamonte Sakai.  The procedure was complicated by the development of a pneumothorax which required chest tube placement.  The patient's biopsy was consistent with non-small cell lung cancer.  The patient was then evaluated by Dr. Servando Snare and underwent robotic assisted resection of the lung mass on 10/28/2019.  Pathology [id number]) was consistent with invasive adenocarcinoma 3.5 cm.  Sternal pleural invasion was intensified.  The margins were negative.  There was one positive lymph node (4L) for metastatic carcinoma.  Patient was then referred to the clinic today for evaluation and recommendations for further  management.  Today, the patient is feeling pretty good if it were not for his chest tube. He is scheduled to see Dr. Servando Snare from cardiothoracic surgery tomorrow to see if he can have this removed.  He denies any recent fever, chills, night sweats, or weight loss.  He denies any hemoptysis. He reports dyspnea on exertion and chest discomfort from his chest tube. He denies any nausea, vomiting, or constipation. He reports intermittent diarrhea secondary to his metformin. He denies any headache or visual changes.   He denies a family history of cancer.  The patient is married and has 2 children and his wife has 1 son from a previous marriage.  Patient denies any alcohol abuse or drug use.  The patient has been smoking approximately 2 packs a day for the last 60 years.  The patient is currently smoking 1 pack a day. HPI  Past Medical History:  Diagnosis Date  . BPH (benign prostatic hypertrophy)    weak stream  . Chronic kidney disease   . Complication of anesthesia    urinary retention  . COPD (chronic obstructive pulmonary disease) ([place])   . Diabetes mellitus without complication ([place])    Type II  . History of bladder stone   . History of colon polyps    08/2013   pre-canerous  . History of kidney stones   . History of melanoma excision    LEFT EAR  . Hyperlipidemia   . Hypertension   . Iron deficiency   . Lung cancer, lingula ([place]) 11/01/2019  . Open wound    left lower jaw; followed by ENT  . Peripheral vascular disease ([place])   . Right ureteral stone   . Shingles 05-15-2015  Past Surgical History:  Procedure Laterality Date  . ABDOMINAL AORTAGRAM  11/01/2014   Procedure: Abdominal Aortagram;  Surgeon: Serafina Mitchell, MD;  Location: [place] [facility];  Service: Cardiovascular;;  . AORTA - BILATERAL FEMORAL ARTERY BYPASS GRAFT N/A 12/28/2014   Procedure: AORTOBIFEMORAL BYPASS GRAFT;  Surgeon: Serafina Mitchell, MD;  Location: [place];  Service: Vascular;  Laterality: N/A;  .  BRONCHIAL BRUSHINGS  09/06/2019   Procedure: BRONCHIAL BRUSHINGS;  Surgeon: Collene Gobble, MD;  Location: [facility] ENDOSCOPY;  Service: Pulmonary;;  left upper lobe nodule  . BRONCHIAL WASHINGS  09/06/2019   Procedure: BRONCHIAL WASHINGS;  Surgeon: Collene Gobble, MD;  Location: [facility] ENDOSCOPY;  Service: Pulmonary;;  . COLONOSCOPY W/ POLYPECTOMY  09-13-2013  . CYSTOLITHALOPAXY OF BLADDER STONE/ TRANSRECTAL ULTRASOUND PROSTATE BX  08-19-2005  . CYSTOSCOPY W/ RETROGRADES Right 12/02/2012   Procedure: CYSTOSCOPY WITH RETROGRADE PYELOGRAM;  Surgeon: Malka So, MD;  Location: [facility];  Service: Urology;  Laterality: Right;  . CYSTOSCOPY W/ RETROGRADES Bilateral 10/13/2013   Procedure: CYSTOSCOPY WITH RETROGRADE PYELOGRAM;  Surgeon: Irine Seal, MD;  Location: [facility];  Service: Urology;  Laterality: Bilateral;  . CYSTOSCOPY W/ URETERAL STENT PLACEMENT Left 04/11/2014   Procedure: CYSTOSCOPY WITH STENT REPLACEMENT;  Surgeon: Malka So, MD;  Location: [facility];  Service: Urology;  Laterality: Left;  . CYSTOSCOPY WITH LITHOLAPAXY Right 12/02/2012   Procedure: CYSTOSCOPY WITH stone extraction;  Surgeon: Malka So, MD;  Location: [facility];  Service: Urology;  Laterality: Right;  . CYSTOSCOPY WITH LITHOLAPAXY Right 10/13/2013   Procedure: CYSTOSCOPY WITH LITHOLAPAXY, removal of urethral stone with basket;  Surgeon: Irine Seal, MD;  Location: [facility];  Service: Urology;  Laterality: Right;  . CYSTOSCOPY WITH RETROGRADE PYELOGRAM, URETEROSCOPY AND STENT PLACEMENT Left 03/21/2014   Procedure: CYSTOSCOPY WITH RETROGRADE PYELOGRAM, AND LEFT STENT PLACEMENT;  Surgeon: Alexis Frock, MD;  Location: [facility];  Service: Urology;  Laterality: Left;  . CYSTOSCOPY WITH RETROGRADE PYELOGRAM, URETEROSCOPY AND STENT PLACEMENT  09/09/2016   Procedure: CYSTOSCOPY WITH RIGHT  RETROGRADE PYELOGRAM, URETEROSCOPY WITH LASER BASKET EXTRACTION  OF STONES  AND STENT PLACEMENT;  Surgeon: Irine Seal, MD;  Location: [facility];  Service: Urology;;  . Consuela Mimes WITH URETEROSCOPY Left 04/11/2014   Procedure: CYSTOSCOPY WITH URETEROSCOPY/ STONE EXTRACTION;  Surgeon: Malka So, MD;  Location[name];  Service: Urology;  Laterality: Left;  . FINE NEEDLE ASPIRATION  09/06/2019   Procedure: FINE NEEDLE ASPIRATION;  Surgeon: Collene Gobble, MD;  Location: [facility] ENDOSCOPY;  Service: Pulmonary;;  left upper lobe - lung  . HOLMIUM LASER APPLICATION Right 4/40/3474   Procedure: HOLMIUM LASER APPLICATION;  Surgeon: Malka So, MD;  Location: [facility];  Service: Urology;  Laterality: Right;  . HOLMIUM LASER APPLICATION Left 25/95/6387   Procedure: HOLMIUM LASER APPLICATION;  Surgeon: Malka So, MD;  Location: [facility];  Service: Urology;  Laterality: Left;  . HOLMIUM LASER APPLICATION Right 5/64/3329   Procedure: HOLMIUM LASER APPLICATION;  Surgeon: Irine Seal, MD;  Location: [facility];  Service: Urology;  Laterality: Right;  . INGUINAL HERNIA REPAIR Right 1954  . INTERCOSTAL NERVE BLOCK Left 10/28/2019   Procedure: Intercostal Nerve Block;  Surgeon: [name], MD;  Location: [place];  Service: Thoracic;  Laterality: Left;  . LITHOTRIPSY  2007 & 02/2013  . LUNG BIOPSY  09/06/2019   Procedure: LUNG BIOPSY;  Surgeon: Collene Gobble, MD;  Location: [facility] ENDOSCOPY;  Service: Pulmonary;;  left upper lob lung  . [place]  . MICROLARYNGOSCOPY Right 02/18/2016   Procedure: MICRO DIRECT LARYNGOSCOPY EXCISIONAL BIOPSY OF RIGHT VOCAL CORD MASS;  Surgeon: Leta Baptist, MD;  Location: [place];  Service: ENT;  Laterality: Right;  . [place]   left ear  . MULTIPLE TOOTH EXTRACTIONS  1994  . NEPHROLITHOTOMY Left 01/27/2013   Procedure: NEPHROLITHOTOMY PERCUTANEOUS FIRST LOOK;  Surgeon: Malka So, MD;  Location: [facility];   Service: Urology;  Laterality: Left;  . NODE DISSECTION  10/28/2019   Procedure: Node Dissection;  Surgeon: [name], MD;  Location: [facility];  Service: Thoracic;;  . PERIPHERAL VASCULAR CATHETERIZATION N/A 11/01/2014   Procedure: Lower Extremity Angiography;  Surgeon: Serafina Mitchell, MD;  Location: [place] [facility];  Service: Cardiovascular;  Laterality: N/A;  . URETEROSCOPY Right 12/02/2012   Procedure: RIGHT URETEROSCOPY STONE EXTRACTION WITH  STENT PLACEMENT;  Surgeon: Malka So, MD;  Location: [facility];  Service: Urology;  Laterality: Right;  Marland Kitchen VIDEO BRONCHOSCOPY  10/28/2019   VIDEO BRONCHOSCOPY WITH ENDOBRONCHIAL NAVIGATION WITH LUNG MARKINGS (N/A  . VIDEO BRONCHOSCOPY WITH ENDOBRONCHIAL NAVIGATION N/A 09/06/2019   Procedure: VIDEO BRONCHOSCOPY WITH ENDOBRONCHIAL NAVIGATION;  Surgeon: Collene Gobble, MD;  Location: [place] ENDOSCOPY;  Service: Pulmonary;  Laterality: N/A;  . VIDEO BRONCHOSCOPY WITH ENDOBRONCHIAL NAVIGATION N/A 10/28/2019   Procedure: VIDEO BRONCHOSCOPY WITH ENDOBRONCHIAL NAVIGATION WITH LUNG MARKINGS;  Surgeon: [name], MD;  Location: [place];  Service: Thoracic;  Laterality: N/A;  . WEDGE RESECTION  10/28/2019   LEFT UPPER LOBE    Family History  Problem Relation Age of Onset  . Dementia Mother   . Hypertension Mother   . Stroke Mother 70  . Dementia Father   . Hypertension Sister   . Colon cancer Neg Hx     Social History Social History   Tobacco Use  . Smoking status: Current Every Day Smoker    Packs/day: 1.00    Years: 59.00    Pack years: 59.00    Types: Cigarettes    Start date: 06/24/1959  . Smokeless tobacco: Never Used  Substance Use Topics  . Alcohol use: No    Alcohol/week: 0.0 standard drinks  . Drug use: No    Allergies  Allergen Reactions  . Morphine And Related Anxiety and Other (See Comments)    Combative, incoherent    . Tape Other (See Comments) and Rash    Surgical tape Adhesive tape causes  blisters  . Amoxicillin Other (See Comments)    Severe stomach pain  . Naproxen Swelling    Eyes swell    Current Outpatient Medications  Medication Sig Dispense Refill  . acetaminophen (TYLENOL) 500 MG tablet Take 2 tablets (1,000 mg total) by mouth every 6 (six) hours as needed for mild pain. 30 tablet 0  . albuterol (PROVENTIL HFA;VENTOLIN HFA) 108 (90 Base) MCG/ACT inhaler Inhale 2 puffs into the lungs every 6 (six) hours as needed for wheezing or shortness of breath. 1 Inhaler 6  . amLODipine (NORVASC) 10 MG tablet Take 10 mg by mouth daily.    Marland Kitchen aspirin EC 81 MG tablet Take 81 mg by mouth daily.    Marland Kitchen atorvastatin (LIPITOR) 40 MG tablet Take 40 mg by mouth daily.    . cetirizine (ZYRTEC) 10 MG tablet Take 10 mg by mouth daily.    Marland Kitchen  Cholecalciferol (VITAMIN D-3) 1000 units CAPS Take 1,000 Units by mouth daily.     Marland Kitchen dextromethorphan-guaiFENesin (MUCINEX DM) 30-600 MG 12hr tablet Take 1 tablet by mouth 2 (two) times daily as needed for cough.    . ezetimibe (ZETIA) 10 MG tablet Take 5 mg by mouth daily.    . finasteride (PROSCAR) 5 MG tablet Take 5 mg by mouth daily.    . fluticasone (FLONASE) 50 MCG/ACT nasal spray Place 2 sprays into both nostrils daily.     . indapamide (LOZOL) 1.25 MG tablet Take 1.25 mg by mouth daily.    . Magnesium 250 MG TABS Take 250 mg by mouth daily.     . metFORMIN (GLUCOPHAGE-XR) 500 MG 24 hr tablet Take 500-1,000 mg by mouth See admin instructions. Take 500 mg every morning and take 1000 mg at supper    . potassium citrate (UROCIT-K) 10 MEQ (1080 MG) SR tablet Take 10 mEq by mouth in the morning and at bedtime.     . tamsulosin (FLOMAX) 0.4 MG CAPS capsule Take 0.4 mg by mouth in the morning and at bedtime.    . Tiotropium Bromide-Olodaterol (STIOLTO RESPIMAT) 2.5-2.5 MCG/ACT AERS Inhale 2 puffs into the lungs daily. 1 Inhaler 11  . traMADol (ULTRAM) 50 MG tablet Take 1 tablet (50 mg total) by mouth every 6 (six) hours as needed (mild pain). 30 tablet 0    No current facility-administered medications for this visit.    Review of Systems REVIEW OF SYSTEMS:   Review of Systems  Constitutional: Positive for fatigue.  Negative for appetite change, chills, fever and unexpected weight change.  HENT: Negative for mouth sores, nosebleeds, sore throat and trouble swallowing.   Eyes: Negative for eye problems and icterus.  Respiratory: Positive for dyspnea on exertion and cough.  Negative for hemoptysis, and wheezing.   Cardiovascular: Positive for chest discomfort in your chest tube site.  Negative for leg swelling.  Gastrointestinal: Negative for abdominal pain, constipation, diarrhea, nausea and vomiting.  Genitourinary: Negative for bladder incontinence, difficulty urinating, dysuria, frequency and hematuria.   Musculoskeletal: Negative for back pain, gait problem, neck pain and neck stiffness.  Skin: Negative for itching and rash.  Neurological: Negative for dizziness, extremity weakness, gait problem, headaches, light-headedness and seizures.  Hematological: Negative for adenopathy. Does not bruise/bleed easily.  Psychiatric/Behavioral: Negative for confusion, depression and sleep disturbance. The patient is not nervous/anxious.     PHYSICAL EXAMINATION:  Blood pressure (!) 124/91, pulse 95, temperature 97.8 F (36.6 C), temperature source Temporal, resp. rate 18, height 5\' 10"  (1.778 m), weight 175 lb 1.6 oz (79.4 kg), SpO2 97 %.  ECOG PERFORMANCE STATUS: 1  Physical Exam  Constitutional: Oriented to person, place, and time and well-developed, well-nourished, and in no distress.  HENT:  Head: Normocephalic and atraumatic.  Mouth/Throat: Oropharynx is clear and moist. No oropharyngeal exudate.  Eyes: Conjunctivae are normal. Right eye exhibits no discharge. Left eye exhibits no discharge. No scleral icterus.  Neck: Normal range of motion. Neck supple.  Cardiovascular: Normal rate, regular rhythm, normal heart sounds and intact distal  pulses.   Pulmonary/Chest: Effort normal. No respiratory distress. No wheezes. No rales. Chest tube in place. Abdominal: Soft. Bowel sounds are normal. Exhibits no distension and no mass. There is no tenderness.  Musculoskeletal: Normal range of motion. Exhibits no edema.  Lymphadenopathy:    No cervical adenopathy.  Neurological: Alert and oriented to person, place, and time. Exhibits normal muscle tone. Gait normal. Coordination normal.  Skin: Skin  is warm and dry. No rash noted. Not diaphoretic. No erythema. No pallor.  Psychiatric: Mood, memory and judgment normal.  Vitals reviewed.  LABORATORY DATA: Lab Results  Component Value Date   WBC 14.6 (H) 10/30/2019   HGB 15.5 10/30/2019   HCT 46.1 10/30/2019   MCV 93.3 10/30/2019   PLT 205 10/30/2019      Chemistry      Component Value Date/Time   NA 138 10/30/2019 0300   K 3.6 10/30/2019 0300   CL 101 10/30/2019 0300   CO2 27 10/30/2019 0300   BUN 12 10/30/2019 0300   CREATININE 1.17 10/30/2019 0300      Component Value Date/Time   CALCIUM 9.7 10/30/2019 0300   ALKPHOS 68 10/30/2019 0300   AST 22 10/30/2019 0300   ALT 18 10/30/2019 0300   BILITOT 0.6 10/30/2019 0300       RADIOGRAPHIC STUDIES: DG Chest 2 View  Result Date: 11/05/2019 CLINICAL DATA:  Status post left upper lobe wedge resection, left chest tube, subcutaneous emphysema EXAM: CHEST - 2 VIEW COMPARISON:  1421 FINDINGS: Stable left chest tube position. Small left lateral basilar pneumothorax visible on today's exam close to the chest tube site. Similar diffuse left chest and neck subcutaneous emphysema pattern. No new airspace process, collapse or consolidation. Negative for edema or effusion. Normal heart size and vascularity. Aorta atherosclerotic. IMPRESSION: Small left lateral basilar pneumothorax. Otherwise stable postoperative appearance. Electronically Signed   By: Jerilynn Mages.  Shick M.D.   On: 11/05/2019 09:51   DG Chest 2 View  Result Date: 10/26/2019 CLINICAL  DATA:  74 year old male with history of left upper lobe non-small cell carcinoma. EXAM: CHEST - 2 VIEW COMPARISON:  Chest x-ray 09/07/2019. FINDINGS: Lung volumes are increased with emphysematous changes. No consolidative airspace disease. No pleural effusions. No pneumothorax. Nodularity in the medial aspect of the lingula. Pulmonary vasculature and the cardiomediastinal silhouette are within normal limits. Aortic atherosclerosis. IMPRESSION: 1. Nodularity noted in the medial aspect of the lingula, better demonstrated on prior chest CT 09/01/2019. 2. Emphysema. 3. Aortic atherosclerosis. Electronically Signed   By: Vinnie Langton M.D.   On: 10/26/2019 16:25   DG Chest Port 1 View  Result Date: 11/04/2019 CLINICAL DATA:  Subcutaneous emphysema. Status post left upper lobe wedge resection. EXAM: PORTABLE CHEST 1 VIEW COMPARISON:  Chest x-ray from yesterday. FINDINGS: Unchanged left chest tube. The heart size and mediastinal contours are within normal limits. Normal pulmonary vascularity. The lungs remain hyperinflated with emphysematous changes. No focal consolidation, pleural effusion, or pneumothorax. Relatively unchanged extensive subcutaneous emphysema. No acute osseous abnormality. IMPRESSION: 1. Relatively unchanged extensive subcutaneous emphysema. Left chest tube remains in place without visible pneumothorax. Electronically Signed   By: Titus Dubin M.D.   On: 11/04/2019 08:10   DG Chest Port 1 View  Result Date: 11/03/2019 CLINICAL DATA:  Chest tube EXAM: PORTABLE CHEST 1 VIEW COMPARISON:  Yesterday FINDINGS: Left chest tube in place. Extensive chest wall emphysema asymmetric to the left. No visible superimposed pneumothorax. Emphysema. Stable aortic and hilar contours distorted by rotation. IMPRESSION: Extensive soft tissue emphysema without visible pneumothorax. Electronically Signed   By: Monte Fantasia M.D.   On: 11/03/2019 08:48   DG Chest Port 1 View  Result Date: 11/02/2019 CLINICAL  DATA:  Recent left upper lobe wedge resection. EXAM: PORTABLE CHEST 1 VIEW COMPARISON:  Chest x-ray from yesterday. FINDINGS: Unchanged left chest tube. No pneumothorax. Increasing subcutaneous emphysema in the left chest wall and left greater than right lower neck. Postsurgical  changes in the left upper lobe. The lungs are hyperinflated but clear. No pleural effusion. The heart size and mediastinal contours are within normal limits. Normal pulmonary vascularity. No acute osseous abnormality. IMPRESSION: 1. Increasing subcutaneous emphysema.  No pneumothorax. Electronically Signed   By: Titus Dubin M.D.   On: 11/02/2019 09:11   DG CHEST PORT 1 VIEW  Result Date: 11/01/2019 CLINICAL DATA:  Partial lobectomy EXAM: PORTABLE CHEST 1 VIEW COMPARISON:  Yesterday FINDINGS: Left chest wall emphysema appears similar to prior. No visible pneumothorax. Chest tube in place. Normal heart size. Mild atelectasis at the right base. Emphysema. IMPRESSION: Stable chest wall emphysema.  No visible pneumothorax. Electronically Signed   By: Monte Fantasia M.D.   On: 11/01/2019 09:04   DG Chest Port 1 View  Result Date: 10/31/2019 CLINICAL DATA:  Status post video bronchoscopy. EXAM: PORTABLE CHEST 1 VIEW COMPARISON:  Oct 30, 2019. FINDINGS: Stable cardiomediastinal silhouette. Stable left-sided chest tube is noted without pneumothorax. Increased subcutaneous emphysema is seen in left supraclavicular and overlying left lateral chest wall regions. Mild right basilar subsegmental atelectasis is noted. Bony thorax is unremarkable. IMPRESSION: Stable left-sided chest tube without pneumothorax. Increased subcutaneous emphysema is seen in left supraclavicular and overlying left lateral chest wall regions. Mild right basilar subsegmental atelectasis. Electronically Signed   By: Marijo Conception M.D.   On: 10/31/2019 08:15   DG Chest Port 1 View  Result Date: 10/30/2019 CLINICAL DATA:  Recent pneumothorax EXAM: PORTABLE CHEST 1  VIEW COMPARISON:  Oct 29, 2019 FINDINGS: Left chest tube position unchanged. Previously noted pneumothorax on the left no longer evident. There is persistent subcutaneous air on the left in the inferobasilar region. Central catheter tip is in the superior vena cava. There is atelectatic change in each lung base. No consolidation. Heart is upper normal in size with pulmonary vascularity normal. No adenopathy. There is aortic atherosclerosis. No appreciable bone lesions. IMPRESSION: Left apical pneumothorax no longer evident. Tube and catheter positions as described. Subcutaneous air noted in the inferolateral left base. Bibasilar atelectasis. Lungs elsewhere clear. Stable cardiac silhouette. Aortic Atherosclerosis (ICD10-I70.0). Electronically Signed   By: Lowella Grip III M.D.   On: 10/30/2019 09:24   DG CHEST PORT 1 VIEW  Result Date: 10/29/2019 CLINICAL DATA:  Postoperative lobectomy.  Chest tube present EXAM: PORTABLE CHEST 1 VIEW COMPARISON:  Oct 28, 2019 FINDINGS: Chest tube remains on the left, unchanged. Postoperative change on the left. There is a minimal left apical pneumothorax. Right jugular catheter tip is in the superior vena cava. There is atelectatic change in the lung bases. There is also patchy airspace opacity in each lower lung zone, increased from 1 day prior. Heart size and pulmonary vascularity are within normal limits. There is aortic atherosclerosis. No bone lesions. IMPRESSION: 1. Chest tube remains on the left with minimal left apical pneumothorax. Stable central catheter position. 2. Atelectatic change in both lower lung regions is essentially stable. Ill-defined airspace opacity noted in each lung base, concerning for developing pneumonia in the bases, slightly more prominent on the right than the left. 3. 3.  Postoperative change on the left noted. 4. Stable cardiac silhouette.  Aortic Atherosclerosis (ICD10-I70.0). Electronically Signed   By: Lowella Grip III M.D.   On:  10/29/2019 09:07   DG Chest Port 1 View  Result Date: 10/28/2019 CLINICAL DATA:  Post bronchoscopy EXAM: PORTABLE CHEST 1 VIEW COMPARISON:  10/26/2019 FINDINGS: Left apical chest tube is present. Right IJ central line tip overlies SVC. No definite  pneumothorax. Left perihilar density, which may be related to mass and biopsy changes. No pleural effusion. Normal heart size. IMPRESSION: Lines and tubes as above.  No definite pneumothorax. Left perihilar density, which may be related to mass and biopsy changes. Electronically Signed   By: Macy Mis M.D.   On: 10/28/2019 16:38   MYOCARDIAL PERFUSION IMAGING  Result Date: 10/21/2019  There was no ST segment deviation noted during stress.  Nuclear stress EF: 75%.  The left ventricular ejection fraction is hyperdynamic (>65%).  The study is normal.  This is a low risk study.    DG C-ARM BRONCHOSCOPY  Result Date: 10/28/2019 C-ARM BRONCHOSCOPY: Fluoroscopy was utilized by the requesting physician.  No radiographic interpretation.    ASSESSMENT: This is a very pleasant 74 year old Caucasian male referred to the clinic for evaluation of stage IIIa (T2a, N2, M0) non-small cell lung cancer, adenocarcinoma.  Patient presented with a left upper lobe nodule. He was diagnosed in March 2021   PLAN: The patient is status post a robotic assisted wedge resection of the left upper lobe under the care of Dr. Servando Snare.  This was performed on 10/28/2019.  The patient was seen with Dr. Julien Nordmann today.  Dr. Julien Nordmann had a lengthy discussion with the patient about his current condition and treatment options.  The patient is a candidate for the alliance trial.  The patient was interested in hearing more about this trial was seen by the research nurse today.  He was given the consent to read and he will call us and let us know if he would like to move forward as a participant in the study.  If the patient is interested in the study, we will arrange for the patient's  tissue to be sent for molecular studies to see if he is a candidate for the trial.   We will see the patient back for follow-up visit in 3 weeks for evaluation and for a more detailed discussion regarding the recommended management depending on the results of his molecular studies.  If the patient is not a candidate for the trial, at that visit we will likely discuss the standard adjuvant treatment with the patient more details.  Of note, we would wait 6 to 8 weeks from the patient's surgery prior to initiating any adjuvant treatment.  I will arrange for the patient to have a brain MRI to complete the staging work-up.  The patient is claustrophobic.  I will arrange for the patient to have his brain MRI performed at [facility] with an open MRI machine.  Additionally, I have sent a prescription for Ativan 0.5 mg to his pharmacy to take prior to his MRI.  The patient voices understanding of current disease status and treatment options and is in agreement with the current care plan.  All questions were answered. The patient knows to call the clinic with any problems, questions or concerns. We can certainly see the patient much sooner if necessary.  Thank you so much for allowing me to participate in the care of JUANDAVID DALLMAN. I will continue to follow up the patient with you and assist in his care.  The total time spent in the appointment was 60 minutes.  Disclaimer: This note was dictated with voice recognition software. Similar sounding words can inadvertently be transcribed and may not be corrected upon review.   Dayanne Yiu L Kerigan Narvaez Nov 09, 2019, 9:10 AM  ADDENDUM: Hematology/oncology Attending: I had a face-to-face encounter with the patient today.  I recommended  his care plan.  This is a very pleasant 74 years old white male who was found on a screening CT scan of the chest to have suspicious left upper lobe lung nodule.  This was confirmed with a PET scan to be hypermetabolic  worrisome for primary bronchogenic carcinoma.  There was no other evidence of metastatic disease in the mediastinal lymph nodes or distant metastasis. The patient had bronchoscopy under the care of Dr. Lamonte Sakai that was consistent with non-small cell lung cancer.  He was referred to Dr. Servando Snare and on 10/28/2019 he underwent bronchoscopy with navigation bronchoscopy as well as wedge resection of the lingula with lymph node biopsy.  The final pathology ([id number]) showed invasive adenocarcinoma measuring 3.5 cm with visceral pleural invasion as well as lymphovascular invasion but the resection margin was negative for malignancy.  The dissected lymph nodes showed metastatic adenocarcinoma to 4L lymph node.  The final pathologic staging was pT2a, pN2. Dr. Servando Snare kindly referred the patient to [place] today for evaluation and recommendation regarding treatment of his condition. I had a lengthy discussion with the patient and his wife today about his current disease stage, prognosis and treatment options.  I explained to the patient that he probably with benefit from adjuvant systemic chemotherapy and probably followed by adjuvant radiation. I also discussed with the patient the option of enrollment in the alchemist trial for molecular studies followed by treatment with targeted therapy if the patient has any actionable mutation. The patient was interested in the clinical trial and he will be seen by the clinical research nurse later today for discussion of his eligibility and consent signing. We will see the patient back for follow-up visit in 3 weeks for reevaluation and more discussion of his treatment options based on the molecular studies and also his decision regarding enrollment in the trial or not. For smoking cessation was strongly encouraged the patient to quit smoking. The patient was advised to call immediately if he has any concerning symptoms in the interval.  Disclaimer: This note was dictated with  voice recognition software. Similar sounding words can inadvertently be transcribed and may be missed upon review. Eilleen Kempf, MD 11/09/19

## 2019-11-09 NOTE — Telephone Encounter (Signed)
Scheduled appt per 5/19 los - pt aware of appt date and time

## 2019-11-09 NOTE — Research (Signed)
11/09/2019  Received phone call from Dr. Julien Nordmann requesting follow-up call to patient on Friday to expedite process of screening, due to turnaround time for testing.  Cindy S. Brigitte Pulse BSN, RN, [place] 11/09/2019 3:07 PM

## 2019-11-10 ENCOUNTER — Other Ambulatory Visit: Payer: Self-pay | Admitting: *Deleted

## 2019-11-10 ENCOUNTER — Ambulatory Visit
Admission: RE | Admit: 2019-11-10 | Discharge: 2019-11-10 | Disposition: A | Discharge status: Home / Self Care | Payer: Medicare Other | Source: Ambulatory Visit | Attending: Cardiothoracic Surgery | Admitting: Cardiothoracic Surgery

## 2019-11-10 ENCOUNTER — Ambulatory Visit: Payer: Self-pay | Admitting: Cardiothoracic Surgery

## 2019-11-10 ENCOUNTER — Ambulatory Visit (INDEPENDENT_AMBULATORY_CARE_PROVIDER_SITE_OTHER): Payer: Self-pay | Admitting: Cardiothoracic Surgery

## 2019-11-10 VITALS — BP 110/75 | HR 108 | Temp 97.7°F | Resp 20 | Ht 70.0 in | Wt 174.0 lb

## 2019-11-10 DIAGNOSIS — C3490 Malignant neoplasm of unspecified part of unspecified bronchus or lung: Secondary | ICD-10-CM | POA: Diagnosis not present

## 2019-11-10 DIAGNOSIS — C3410 Malignant neoplasm of upper lobe, unspecified bronchus or lung: Secondary | ICD-10-CM

## 2019-11-10 DIAGNOSIS — Z09 Encounter for follow-up examination after completed treatment for conditions other than malignant neoplasm: Secondary | ICD-10-CM

## 2019-11-10 NOTE — Progress Notes (Signed)
[address]       [place] [address]             [phone]      Tranell J Schwegel [place] Medical Record #[id number] Date of Birth: 09-29-1945  Referring: Collene Gobble, MD Primary Care: [name], MD Primary Cardiologist: Minus Breeding, MD   Chief Complaint:   POST OP FOLLOW UP OPERATIVE REPORT DATE OF PROCEDURE:  10/28/2019 PREOPERATIVE DIAGNOSIS:  Non-small-cell lung cancer, left upper lobe lingula. POSTOPERATIVE DIAGNOSIS:  Non-small-cell lung cancer, left upper lobe lingula. SURGICAL PROCEDURES:   1.  Bronchoscopy with navigation bronchoscopy and marking of the left lung lesion. 2.  Robotic-assisted wedge resection of lingula with lymph node biopsy and intercostal nerve blocks.  Cancer Staging Lung cancer, lingula ([place]) Staging form: Lung, AJCC 8th Edition - Pathologic stage from 11/01/2019: Stage IIIA (pT2a, pN2, cM0) - Signed by [name], MD on 11/01/2019  History of Present Illness:     Patient returns today for follow-up visit after recent discharge from the hospital.  He underwent robotic assisted wedge resection of the lingula with lymph node biopsy and intercostal nerve block, postop he had a prolonged air leak and was discharged home with chest tube in place.  He returns today doing relatively well, little drainage from the tube and no evidence of air leak with coughing.  He still has some subcu air present but this is markedly decreased from prior to discharge.     Past Medical History:  Diagnosis Date  . BPH (benign prostatic hypertrophy)    weak stream  . Chronic kidney disease   . Complication of anesthesia    urinary retention  . COPD (chronic obstructive pulmonary disease) ([place])   . Diabetes mellitus without complication ([place])    Type II  . History of bladder stone   . History of colon polyps    08/2013   pre-canerous  . History of kidney stones   . History of melanoma excision    LEFT EAR  .  Hyperlipidemia   . Hypertension   . Iron deficiency   . Lung cancer, lingula ([place]) 11/01/2019  . Open wound    left lower jaw; followed by ENT  . Peripheral vascular disease ([place])   . Right ureteral stone   . Shingles 05-15-2015     Social History   Tobacco Use  Smoking Status Current Every Day Smoker  . Packs/day: 1.00  . Years: 59.00  . Pack years: 59.00  . Types: Cigarettes  . Start date: 06/24/1959  Smokeless Tobacco Never Used    Social History   Substance and Sexual Activity  Alcohol Use No  . Alcohol/week: 0.0 standard drinks     Allergies  Allergen Reactions  . Morphine And Related Anxiety and Other (See Comments)    Combative, incoherent    . Tape Other (See Comments) and Rash    Surgical tape Adhesive tape causes blisters  . Amoxicillin Other (See Comments)    Severe stomach pain  . Naproxen Swelling    Eyes swell    Current Outpatient Medications  Medication Sig Dispense Refill  . acetaminophen (TYLENOL) 500 MG tablet Take 2 tablets (1,000 mg total) by mouth every 6 (six) hours as needed for mild pain. 30 tablet 0  . albuterol (PROVENTIL HFA;VENTOLIN HFA) 108 (90 Base) MCG/ACT inhaler Inhale 2 puffs into the lungs every 6 (six) hours as needed for wheezing or shortness of breath. 1 Inhaler  6  . amLODipine (NORVASC) 10 MG tablet Take 10 mg by mouth daily.    Marland Kitchen aspirin EC 81 MG tablet Take 81 mg by mouth daily.    Marland Kitchen atorvastatin (LIPITOR) 40 MG tablet Take 40 mg by mouth daily.    . cetirizine (ZYRTEC) 10 MG tablet Take 10 mg by mouth daily.    . Cholecalciferol (VITAMIN D-3) 1000 units CAPS Take 1,000 Units by mouth daily.     Marland Kitchen dextromethorphan-guaiFENesin (MUCINEX DM) 30-600 MG 12hr tablet Take 1 tablet by mouth 2 (two) times daily as needed for cough.    . ezetimibe (ZETIA) 10 MG tablet Take 5 mg by mouth daily.    . finasteride (PROSCAR) 5 MG tablet Take 5 mg by mouth daily.    . fluticasone (FLONASE) 50 MCG/ACT nasal spray Place 2 sprays into both  nostrils daily.     . indapamide (LOZOL) 1.25 MG tablet Take 1.25 mg by mouth daily.    Marland Kitchen LORazepam (ATIVAN) 0.5 MG tablet Take 1 tablet (0.5 mg total) by mouth every 8 (eight) hours. 2 tablet 0  . Magnesium 250 MG TABS Take 250 mg by mouth daily.     . metFORMIN (GLUCOPHAGE-XR) 500 MG 24 hr tablet Take 500-1,000 mg by mouth See admin instructions. Take 500 mg every morning and take 1000 mg at supper    . potassium citrate (UROCIT-K) 10 MEQ (1080 MG) SR tablet Take 10 mEq by mouth in the morning and at bedtime.     . tamsulosin (FLOMAX) 0.4 MG CAPS capsule Take 0.4 mg by mouth in the morning and at bedtime.    . Tiotropium Bromide-Olodaterol (STIOLTO RESPIMAT) 2.5-2.5 MCG/ACT AERS Inhale 2 puffs into the lungs daily. 1 Inhaler 11  . traMADol (ULTRAM) 50 MG tablet Take 1 tablet (50 mg total) by mouth every 6 (six) hours as needed (mild pain). 30 tablet 0   No current facility-administered medications for this visit.       Physical Exam: BP 110/75   Pulse (!) 108   Temp 97.7 F (36.5 C) (Skin)   Resp 20   Ht 5\' 10"  (1.778 m)   Wt 174 lb (78.9 kg)   SpO2 95% Comment: RA  BMI 24.97 kg/m   General appearance: alert, cooperative and no distress Neurologic: intact Heart: regular rate and rhythm, S1, S2 normal, no murmur, click, rub or gallop Lungs: clear to auscultation bilaterally Abdomen: soft, non-tender; bowel sounds normal; no masses,  no organomegaly Extremities: extremities normal, atraumatic, no cyanosis or edema Wound: No air leak from chest tube, some slight subcu air is still present but decreased from previous exam   Diagnostic Studies & Laboratory data:     Recent Radiology Findings:   DG Chest 2 View  Result Date: 11/10/2019 CLINICAL DATA:  Lung cancer. EXAM: CHEST - 2 VIEW COMPARISON:  11/05/2019. FINDINGS: Left chest tube noted in stable position. Improved left-sided pneumothorax with mild left apical residual. Mild left base atelectasis. Heart size normal.  Bilateral neck and diffuse left chest wall subcutaneous emphysema again noted. IMPRESSION: Left chest tube in stable position. Interim improvement of left pneumothorax with mild left apical residual. Diffuse subcutaneous emphysema again noted. Electronically Signed   By: Marcello Moores  Register   On: 11/10/2019 08:50      Recent Lab Findings: Lab Results  Component Value Date   WBC 9.1 11/09/2019   HGB 16.4 11/09/2019   HCT 48.5 11/09/2019   PLT 273 11/09/2019   GLUCOSE 126 (H) 11/09/2019  ALT 14 11/09/2019   AST 14 (L) 11/09/2019   NA 135 11/09/2019   K 4.2 11/09/2019   CL 96 (L) 11/09/2019   CREATININE 1.09 11/09/2019   BUN 19 11/09/2019   CO2 30 11/09/2019   INR 1.1 10/26/2019      Assessment / Plan:   Patient returns to the office today for chest tube removal, with no air leak.  Chest tube was removed in office today Patient and his wife were instructed in care of the chest tube site Will return in 1 week with a follow-up chest x-ray, they are aware should he have increasing subcu air to call immediately Patient has been seen by medical oncology and considering his treatment options with stage IIIa disease  Medication Changes: No orders of the defined types were placed in this encounter.     [name] MD      [address] [place] [address] Office [phone]     11/10/2019 4:19 PM

## 2019-11-10 NOTE — Progress Notes (Signed)
The proposed treatment discussed in cancer conference 11/10/19 is for discussion purpose only and is not a binding recommendation.  The patient was not physically examined nor present for their treatment options.  Therefore, final treatment plans cannot be decided.  

## 2019-11-11 ENCOUNTER — Telehealth: Payer: Self-pay | Admitting: *Deleted

## 2019-11-11 NOTE — Telephone Encounter (Signed)
11/11/2019 Research - [facility] [phone] ALCHEMIST consent f/u call  Received In Basket message from Dr. Julien Nordmann, with forwarded message from Dr. Servando Snare stating that patient would like to proceed with the screening trial.  Contacted patient's wife today who confirms that they have read the consent form and that Chad Gomez would like to proceed with the ALCHEMIST screening trial enrollment and testing. Plans made for patient and wife to come to clinic on Monday 11/14/2019 at 11am for consent visit and initial data collection. Tumor tissue expected to be shipped on Monday or Tuesday, in hopes of having results back prior to patient visit on 11/30/2019.  Chad Gomez BSN, RN, [place] 11/11/2019 11:05 AM

## 2019-11-12 ENCOUNTER — Other Ambulatory Visit: Payer: Self-pay | Admitting: Pulmonary Disease

## 2019-11-14 ENCOUNTER — Encounter: Payer: Self-pay | Admitting: *Deleted

## 2019-11-14 ENCOUNTER — Inpatient Hospital Stay: Payer: Medicare Other | Admitting: *Deleted

## 2019-11-14 ENCOUNTER — Other Ambulatory Visit: Payer: Self-pay

## 2019-11-14 DIAGNOSIS — C3492 Malignant neoplasm of unspecified part of left bronchus or lung: Secondary | ICD-10-CM

## 2019-11-14 NOTE — Research (Signed)
11/14/2019  Research - DCP-001 initial consent visit  Following discussion with patient regarding the [facility] [id number] ALCHEMIST trial, patient was approached regarding participation in the DCP-001 "Use of a Clinical Trial Screening Tool to Address Cancer Health Disparities in the [place] Program" project. Explained to patient that this involves one-time consent, and collection of demographic and clinical variables, with the majority of data being collected from his medical record. Patient is aware that he qualifies for the DCP-001 study regardless of his decision to enroll in or eligibility for the [facility] [phone] study. He is aware that this study participation could also apply to other NCI clinical trials, including ALCHEMIST treatment trials, if applicable. Copies of the consent and authorization forms were given to the patient with plans to discuss participation further at his next clinic visit on 11/30/2019 to determine his interest and willingness to participate in this study.  Cindy S. Brigitte Pulse BSN, RN, [place] 11/14/2019 12:46 PM

## 2019-11-14 NOTE — Research (Signed)
11/14/2019 [facility] [id number] ALCHEMIST consent visit  Met with patient and wife today x 45 minutes to review the consent form in its entirety, as well as to complete the baseline epidemiological questionnaires. Both have read and reviewed the contents of the consent and authorization forms since last week's visit. The patient had no further questions and he agreed to participate in the screening study. He is also aware of the voluntary nature of both the screening and associated potential treatment trials. The [id number] "Genetic Testing For Patient with Resectable or Resected Lung Cancer" consent form with [place] active date 03/24/2019 and the HIPAA authorization forms were signed and dated by the patient. Baseline on-study forms were completed with the patient's input, to record baseline clinical information. Patient is aware that a tumor sample is expected to be shipped today or tomorrow with anticipated results received in two weeks, for the 11/30/2019 follow-up visit. Discussed the possibility of a treatment trial being offered, based on the results of the testing. Patient and wife verbalized understanding.  Cindy S. Brigitte Pulse BSN, RN, [facility] 11/14/2019 12:37 PM

## 2019-11-14 NOTE — Progress Notes (Signed)
11/14/2019  Clinical staging reviewed with Dr. Julien Nordmann, confirming that lymph node cytology, accession [id number] specimen B represents hilar node.  Cindy S. Brigitte Pulse BSN, RN, [place] 11/14/2019 1:01 PM

## 2019-11-14 NOTE — Research (Signed)
11/14/2019  [facility] [id number] ALCHEMIST study enrollment  Eligibility reviewed and confirmed by 2nd RN, Foye Spurling, and Dr. Julien Nordmann. Patient was registered to Step 0 and Step 1 in OPEN for ALCHEMIST screening study enrollment. Tissue sample was received from the Pathology department and sample to be shipped via [place] today to [facility] for central testing.  Cindy S. Brigitte Pulse BSN, RN, [facility] 11/14/2019 4:36 PM

## 2019-11-15 ENCOUNTER — Other Ambulatory Visit: Payer: Self-pay | Admitting: *Deleted

## 2019-11-15 DIAGNOSIS — C3492 Malignant neoplasm of unspecified part of left bronchus or lung: Secondary | ICD-10-CM

## 2019-11-15 NOTE — Research (Signed)
11/15/2019  [facility] [id number] ALCHEMIST study enrollment  Correction to tissue sample submission noted, dated 11/14/2019. Sample was logged to the [place] application and shipped to [facility]. for central testing.  Copies of the signed consent and authorization forms were mailed to the patient today. Copies of the consent and authorization forms for the [id number] study were also mailed to the patient for review prior to the next clinic visit on 11/30/2019. As discussed with the patient yesterday, this is a potential study he may be eligible for.  Cindy S. Brigitte Pulse BSN, RN, [place] 11/15/2019 1:27 PM

## 2019-11-16 ENCOUNTER — Telehealth: Payer: Self-pay | Admitting: Physician Assistant

## 2019-11-16 ENCOUNTER — Other Ambulatory Visit: Payer: Self-pay | Admitting: *Deleted

## 2019-11-16 ENCOUNTER — Encounter: Payer: Self-pay | Admitting: *Deleted

## 2019-11-16 NOTE — Telephone Encounter (Signed)
Called pt per 5/25 sch message - pt is aware of appt date and time

## 2019-11-16 NOTE — Progress Notes (Signed)
[address]       [place] [address]             [phone]      Chad Gomez [place] Medical Record #[id number] Date of Birth: 06/27/1945  Referring: Collene Gobble, MD Primary Care: [name], MD Primary Cardiologist: Minus Breeding, MD   Chief Complaint:   POST OP FOLLOW UP OPERATIVE REPORT DATE OF PROCEDURE:  10/28/2019 PREOPERATIVE DIAGNOSIS:  Non-small-cell lung cancer, left upper lobe lingula. POSTOPERATIVE DIAGNOSIS:  Non-small-cell lung cancer, left upper lobe lingula. SURGICAL PROCEDURES:   1.  Bronchoscopy with navigation bronchoscopy and marking of the left lung lesion. 2.  Robotic-assisted wedge resection of lingula with lymph node biopsy and intercostal nerve blocks.  Cancer Staging Lung cancer, lingula ([place]) Staging form: Lung, AJCC 8th Edition - Pathologic stage from 11/01/2019: Stage IIIA (pT2a, pN2, cM0) - Signed by [name], MD on 11/01/2019  History of Present Illness:     Patient returns today for follow-up visit after recent discharge from the hospital.  He underwent robotic assisted wedge resection of the lingula with lymph node biopsy and intercostal nerve block, postop he had a prolonged air leak and was discharged home with chest tube in place.  The chest tube was removed in office last week.  Patient comes in today with a follow-up chest x-ray.  He he denies any fever chills.  The subcu area which was evident is  slowly resolving   Past Medical History:  Diagnosis Date  . BPH (benign prostatic hypertrophy)    weak stream  . Chronic kidney disease   . Complication of anesthesia    urinary retention  . COPD (chronic obstructive pulmonary disease) ([place])   . Diabetes mellitus without complication ([place])    Type II  . History of bladder stone   . History of colon polyps    08/2013   pre-canerous  . History of kidney stones   . History of melanoma excision    LEFT EAR  . Hyperlipidemia   .  Hypertension   . Iron deficiency   . Lung cancer, lingula ([place]) 11/01/2019  . Open wound    left lower jaw; followed by ENT  . Peripheral vascular disease ([place])   . Right ureteral stone   . Shingles 05-15-2015     Social History   Tobacco Use  Smoking Status Current Every Day Smoker  . Packs/day: 1.00  . Years: 59.00  . Pack years: 59.00  . Types: Cigarettes  . Start date: 06/24/1959  Smokeless Tobacco Never Used    Social History   Substance and Sexual Activity  Alcohol Use No  . Alcohol/week: 0.0 standard drinks     Allergies  Allergen Reactions  . Morphine And Related Anxiety and Other (See Comments)    Combative, incoherent    . Tape Other (See Comments) and Rash    Surgical tape Adhesive tape causes blisters  . Amoxicillin Other (See Comments)    Severe stomach pain  . Naproxen Swelling    Eyes swell    Current Outpatient Medications  Medication Sig Dispense Refill  . acetaminophen (TYLENOL) 500 MG tablet Take 2 tablets (1,000 mg total) by mouth every 6 (six) hours as needed for mild pain. 30 tablet 0  . amLODipine (NORVASC) 10 MG tablet Take 10 mg by mouth daily.    Marland Kitchen aspirin EC 81 MG tablet Take 81 mg by mouth daily.    Marland Kitchen  atorvastatin (LIPITOR) 40 MG tablet Take 40 mg by mouth daily.    . cetirizine (ZYRTEC) 10 MG tablet Take 10 mg by mouth daily.    . Cholecalciferol (VITAMIN D-3) 1000 units CAPS Take 1,000 Units by mouth daily.     Marland Kitchen dextromethorphan-guaiFENesin (MUCINEX DM) 30-600 MG 12hr tablet Take 1 tablet by mouth 2 (two) times daily as needed for cough.    . ezetimibe (ZETIA) 10 MG tablet Take 5 mg by mouth daily.    . finasteride (PROSCAR) 5 MG tablet Take 5 mg by mouth daily.    . fluticasone (FLONASE) 50 MCG/ACT nasal spray Place 2 sprays into both nostrils daily.     . indapamide (LOZOL) 1.25 MG tablet Take 1.25 mg by mouth daily.    Marland Kitchen LORazepam (ATIVAN) 0.5 MG tablet Take 1 tablet (0.5 mg total) by mouth every 8 (eight) hours. 2 tablet 0  .  Magnesium 250 MG TABS Take 250 mg by mouth daily.     . metFORMIN (GLUCOPHAGE-XR) 500 MG 24 hr tablet Take 500-1,000 mg by mouth See admin instructions. Take 500 mg every morning and take 1000 mg at supper    . potassium citrate (UROCIT-K) 10 MEQ (1080 MG) SR tablet Take 10 mEq by mouth in the morning and at bedtime.     Marland Kitchen PROAIR HFA 108 (90 Base) MCG/ACT inhaler INHALE 2 PUFFS BY MOUTH EVERY 6 HOURS AS NEEDED FOR WHEEZING AND FOR SHORTNESS OF BREATH 9 g 0  . tamsulosin (FLOMAX) 0.4 MG CAPS capsule Take 0.4 mg by mouth in the morning and at bedtime.    . Tiotropium Bromide-Olodaterol (STIOLTO RESPIMAT) 2.5-2.5 MCG/ACT AERS Inhale 2 puffs into the lungs daily. 1 Inhaler 11  . traMADol (ULTRAM) 50 MG tablet Take 1 tablet (50 mg total) by mouth every 6 (six) hours as needed (mild pain). 30 tablet 0   No current facility-administered medications for this visit.       Physical Exam: BP 99/68   Pulse (!) 105   Temp 97.7 F (36.5 C) (Skin)   Resp 20   Ht 5\' 10"  (1.778 m)   Wt 170 lb (77.1 kg)   SpO2 94% Comment: RA  BMI 24.39 kg/m   General appearance: alert, cooperative and no distress Neck: no adenopathy, no carotid bruit, no JVD, supple, symmetrical, trachea midline and thyroid not enlarged, symmetric, no tenderness/mass/nodules Lymph nodes: Cervical, supraclavicular, and axillary nodes normal. Resp: clear to auscultation bilaterally Cardio: regular rate and rhythm, S1, S2 normal, no murmur, click, rub or gallop GI: soft, non-tender; bowel sounds normal; no masses,  no organomegaly Extremities: extremities normal, atraumatic, no cyanosis or edema and Homans sign is negative, no sign of DVT Neurologic: Grossly normal Some subcu air is still evident but markedly proved from last week.  Diagnostic Studies & Laboratory data:     Recent Radiology Findings:   DG Chest 2 View  Result Date: 11/17/2019 CLINICAL DATA:  History of lung cancer short of breath EXAM: CHEST - 2 VIEW COMPARISON:   11/10/2019, 11/05/2019, 10/28/2019 FINDINGS: Removal of left-sided chest tube. Left perihilar postsurgical changes. No acute consolidation or pleural effusion. The previously noted small left apical pneumothorax is not identified. Moderate residual soft tissue emphysema within the supraclavicular regions and left chest wall, overall decreased as compared with 11/10/2019. IMPRESSION: 1. Removal of left chest tube. Previously noted left apical pneumothorax is not identified 2. Moderate residual subcutaneous emphysema but overall decreased as compared with 11/10/2019 3. Left postsurgical changes Electronically Signed  By: Donavan Foil M.D.   On: 11/17/2019 17:05    I have independently reviewed the above radiology studies  and reviewed the findings with the patient.    Recent Lab Findings: Lab Results  Component Value Date   WBC 9.1 11/09/2019   HGB 16.4 11/09/2019   HCT 48.5 11/09/2019   PLT 273 11/09/2019   GLUCOSE 126 (H) 11/09/2019   ALT 14 11/09/2019   AST 14 (L) 11/09/2019   NA 135 11/09/2019   K 4.2 11/09/2019   CL 96 (L) 11/09/2019   CREATININE 1.09 11/09/2019   BUN 19 11/09/2019   CO2 30 11/09/2019   INR 1.1 10/26/2019      Assessment / Plan:    Patient continues to improve following robotic assisted wedge resection left upper lobe malignancy final path revealed stage IIIa disease Patient is seen in medical oncology and is enrolling in the alchemist trial He has appointment with medical oncology in the next 2 weeks.  I will plan to see the patient back in 3 weeks with a follow-up chest x-ray    Medication Changes: No orders of the defined types were placed in this encounter.     [name] MD      [address] [place] [address] Office [phone]     11/17/2019 5:54 PM

## 2019-11-16 NOTE — Patient Outreach (Signed)
Chad Gomez [facility]) Care Management  11/16/2019  Chad Gomez 05-02-1946 [id number]   Subjective: Telephone call to patient's home / mobile number, spoke with patient, stated his name and call disconnected.   Telephone call to patient's home / mobile number, spoke with patient's wife/ designated party release Chad Gomez), stated patient's name, date of birth, and address.  RNCM advised was speaking with patient and call disconnected, wife states she can assist, better to talk with her, because patient has very little patience.  Discussed [place] [employer] EMMI General Discharge Red Flag Alert follow up, wife voiced understanding, and is in agreement to follow up on patient's behalf.  Wife states patient is doing so so, appetite very poor, stitches at surgery site are bothering patient, hoping stitches will be removed soon, and planning to discuss concerns with surgeon during 11/17/2019 follow up appointment. Wife states patient is aware that if he does not eat enough with one of his medications, it will  give him diarrhea, and he makes sure he eats enough with that particular medication, wife does not recall the name of medication at this time. Wife states she is aware of signs/ symptoms to report, how to reach provider if needed after hours, when to go to ED, and / or call 911. Discussed importance of hospital follow up with primary MD, wife voices understanding, and states patient will follow up as appropriate.  States primary MD is aware of patient's hospitalization and patient will follow up as needed.  Wife states patient has the following scheduled appointments: with oncologist on 11/30/2019, with pharmacist Vinie Sill) at primary MD's office for diabetes medication management on 12/13/2019, and for MRI on 12/15/2019.  Wife states patient  is able to manage some self care and has assistance as needed. Wife voices understanding of patient's medical diagnosis and  treatment plan.   States patient has been newly diagnosed with diabetes, A1C being monitored,  and being managed by pharmacist at primary MD's office.  Wife states she remembers patient receiving EMMI automated calls. States patient did not receive discharge papers originally, wife has retrieved discharge instructions on line, has reviewed with a nurse, and all of her questions have been answered.   Wife states patient continues to smoke and wife is assisting him with cutting back by hiding his cigarettes.   States she is accessing his [employer] benefits on patient's behalf as needed via member services number on back of card.  Wife states patient does not have any education material, EMMI follow up, care coordination, care management, disease monitoring, transportation, community resource, or pharmacy needs at this time.  States she is very appreciative of the follow up, is in agreement  to receive 1 additional follow up call to assess for further CM needs on patient's behalf, and is in agreement to receive [place] [facility] EMMI follow up calls as needed.     Objective: Per KPN (Knowledge Performance Now, point of care tool) and chart review, patient hospitalized 10/28/2019 - 11/05/2019 for lung cancer, Non-small-cell lung cancer, left upper lobe lingula, status post Bronchoscopy, Robotic-assisted wedge resection of lingula with lymph node biopsy and intercostal nerve blocks, on 11/03/2019, status post VIDEO BRONCHOSCOPY WITH ENDOBRONCHIAL NAVIGATION WITH LUNG MARKINGS, XI ROBOTIC ASSISTED THORASCOPY, -Wedge Resection Lingula Left Upper Lobe, on 10/28/2019.  Patient has a history of BPH (benign prostatic hypertrophy), Chronic kidney disease, COPD (chronic obstructive pulmonary disease), diabetes, bladder stone, colon polyps, kidney stones, melanoma excision (left ear),  Hyperlipidemia, Hypertension, Peripheral vascular disease, Right ureteral stone, Shingles, chronic indwelling foley, Tobacco  abuse, Nephrolithiasis, Centrilobular emphysema, Aortoiliac occlusive disease , and status post aortobifemoral bypass surgery on 05/26/2016.  Patient's A1C was 6.2 on 06/07/2019.        Assessment: Received [employer] EMMI General Discharge Red Flag Alert follow up referral on 11/08/2019.   EMMI Red Alert Trigger, Day #1, patient stated no to the following question: Got discharge papers?    EMMI follow up completed and will follow up to assess further care management needs.       Plan: RNCM will call patient's wife / designated party release for telephone outreach attempt, within 14 business days, EMMI follow up, to assess for further CM needs, and proceed with case closure, within 10 business days if no return call, after 4th unsuccessful call.       Kennetha Pearman H. Annia Friendly, BSN, [place] [facility] Telephonic CM Phone: [phone] Fax: [phone]

## 2019-11-17 ENCOUNTER — Ambulatory Visit
Admission: RE | Admit: 2019-11-17 | Discharge: 2019-11-17 | Disposition: A | Discharge status: Home / Self Care | Payer: Medicare Other | Source: Ambulatory Visit | Attending: Cardiothoracic Surgery | Admitting: Cardiothoracic Surgery

## 2019-11-17 ENCOUNTER — Ambulatory Visit (INDEPENDENT_AMBULATORY_CARE_PROVIDER_SITE_OTHER): Payer: Self-pay | Admitting: Cardiothoracic Surgery

## 2019-11-17 ENCOUNTER — Other Ambulatory Visit: Payer: Self-pay

## 2019-11-17 ENCOUNTER — Ambulatory Visit: Payer: Medicare Other | Admitting: Cardiothoracic Surgery

## 2019-11-17 ENCOUNTER — Other Ambulatory Visit: Payer: Self-pay | Admitting: *Deleted

## 2019-11-17 VITALS — BP 99/68 | HR 105 | Temp 97.7°F | Resp 20 | Ht 70.0 in | Wt 170.0 lb

## 2019-11-17 DIAGNOSIS — C3410 Malignant neoplasm of upper lobe, unspecified bronchus or lung: Secondary | ICD-10-CM

## 2019-11-17 DIAGNOSIS — Z85118 Personal history of other malignant neoplasm of bronchus and lung: Secondary | ICD-10-CM

## 2019-11-17 DIAGNOSIS — R0602 Shortness of breath: Secondary | ICD-10-CM | POA: Diagnosis not present

## 2019-11-17 DIAGNOSIS — Z09 Encounter for follow-up examination after completed treatment for conditions other than malignant neoplasm: Secondary | ICD-10-CM

## 2019-11-17 DIAGNOSIS — J95812 Postprocedural air leak: Secondary | ICD-10-CM

## 2019-11-17 NOTE — Progress Notes (Unsigned)
XR

## 2019-11-30 ENCOUNTER — Inpatient Hospital Stay: Payer: Medicare Other | Attending: Physician Assistant | Admitting: Physician Assistant

## 2019-11-30 ENCOUNTER — Inpatient Hospital Stay: Payer: Medicare Other

## 2019-11-30 ENCOUNTER — Other Ambulatory Visit: Payer: Self-pay

## 2019-11-30 ENCOUNTER — Encounter: Payer: Self-pay | Admitting: *Deleted

## 2019-11-30 ENCOUNTER — Encounter: Payer: Self-pay | Admitting: Physician Assistant

## 2019-11-30 VITALS — BP 112/73 | HR 95 | Temp 97.5°F | Resp 17 | Ht 70.0 in | Wt 168.5 lb

## 2019-11-30 DIAGNOSIS — C3492 Malignant neoplasm of unspecified part of left bronchus or lung: Secondary | ICD-10-CM

## 2019-11-30 DIAGNOSIS — Z902 Acquired absence of lung [part of]: Secondary | ICD-10-CM | POA: Insufficient documentation

## 2019-11-30 DIAGNOSIS — F17210 Nicotine dependence, cigarettes, uncomplicated: Secondary | ICD-10-CM | POA: Diagnosis not present

## 2019-11-30 DIAGNOSIS — C3410 Malignant neoplasm of upper lobe, unspecified bronchus or lung: Secondary | ICD-10-CM | POA: Diagnosis not present

## 2019-11-30 DIAGNOSIS — Z5111 Encounter for antineoplastic chemotherapy: Secondary | ICD-10-CM

## 2019-11-30 DIAGNOSIS — C3412 Malignant neoplasm of upper lobe, left bronchus or lung: Secondary | ICD-10-CM | POA: Diagnosis not present

## 2019-11-30 DIAGNOSIS — C771 Secondary and unspecified malignant neoplasm of intrathoracic lymph nodes: Secondary | ICD-10-CM | POA: Diagnosis not present

## 2019-11-30 DIAGNOSIS — J449 Chronic obstructive pulmonary disease, unspecified: Secondary | ICD-10-CM | POA: Diagnosis not present

## 2019-11-30 DIAGNOSIS — N189 Chronic kidney disease, unspecified: Secondary | ICD-10-CM | POA: Insufficient documentation

## 2019-11-30 DIAGNOSIS — E785 Hyperlipidemia, unspecified: Secondary | ICD-10-CM | POA: Insufficient documentation

## 2019-11-30 DIAGNOSIS — E119 Type 2 diabetes mellitus without complications: Secondary | ICD-10-CM | POA: Insufficient documentation

## 2019-11-30 DIAGNOSIS — Z7984 Long term (current) use of oral hypoglycemic drugs: Secondary | ICD-10-CM | POA: Diagnosis not present

## 2019-11-30 DIAGNOSIS — Z7189 Other specified counseling: Secondary | ICD-10-CM

## 2019-11-30 DIAGNOSIS — Z7982 Long term (current) use of aspirin: Secondary | ICD-10-CM | POA: Insufficient documentation

## 2019-11-30 DIAGNOSIS — N400 Enlarged prostate without lower urinary tract symptoms: Secondary | ICD-10-CM | POA: Diagnosis not present

## 2019-11-30 DIAGNOSIS — Z85820 Personal history of malignant melanoma of skin: Secondary | ICD-10-CM | POA: Insufficient documentation

## 2019-11-30 DIAGNOSIS — Z006 Encounter for examination for normal comparison and control in clinical research program: Secondary | ICD-10-CM | POA: Insufficient documentation

## 2019-11-30 DIAGNOSIS — Z79899 Other long term (current) drug therapy: Secondary | ICD-10-CM | POA: Diagnosis not present

## 2019-11-30 DIAGNOSIS — I10 Essential (primary) hypertension: Secondary | ICD-10-CM | POA: Diagnosis not present

## 2019-11-30 MED ORDER — PROCHLORPERAZINE MALEATE 10 MG PO TABS: 10.0000 mg | ORAL_TABLET | Freq: Four times a day (QID) | ORAL | 2 refills | Status: DC | PRN

## 2019-11-30 MED ORDER — FOLIC ACID 1 MG PO TABS: 1.0000 mg | ORAL_TABLET | Freq: Every day | ORAL | 2 refills | Status: DC

## 2019-11-30 NOTE — Research (Signed)
11/30/2019   Research - DCP-001 consent visit  Met with patient today for consent to the Alliance [id number] treatment substudy. Following that consent visit, reviewed the consent and authorization documents for the DCP-001 research study, provided to the patient at the last clinic visit. The patient had no further questions and agreed to study participation. The consent and authorization forms were signed and dated by the patient.  Thanked patient for his willingness to participate in the trial. Will proceed with DCP-001 enrollment for the ALCHEMIST screening trial, and for the [id number] study at the time of study enrollment or screen failure.  Cindy S. Brigitte Pulse BSN, RN, [place] 11/30/2019 5:44 PM

## 2019-11-30 NOTE — Research (Signed)
11/30/2019  Research - Alliance [id number] study consent visit  Patient in to clinic today for follow-up related to ALCHEMIST screening study results. Following discussion with Dr. Julien Nordmann, it was confirmed that patient wished to continue with participation in the [place]: ALCHEMIST CHEMO-IO (ACCIO) treatment sub-study. Patient's wife confirmed receipt and review of the consent and authorization forms that were mailed to the patient on 11/15/2019.  The consent and authorization forms were reviewed with the patient in their entirety and he had no further questions regarding participation. He agreed to participation and to proceed with eligibility review. The consent and authorization forms were signed and dated by the patient. Will proceed with scheduling required studies with anticipation of treatment start at some point following completion of brain imaging scheduled for 12/15/2019.  Patient is aware that men of reproductive potential must agree to use an appropriate method of birth control throughout the study and for 3 months following the end of protocol therapy. Patient states that his sole partner is his post-menopausal wife. He understands the risks and requirements for birth control use with a partner of child-bearing potential.  Per Dr. Julien Nordmann, patient has no medical conditions or psychiatric illness which would make the protocol treatment unreasonably hazardous for this patient or which would prevent the patient from giving informed consent.  Cindy S. Brigitte Pulse BSN, RN, [facility] 11/30/2019 5:32 PM

## 2019-11-30 NOTE — Progress Notes (Signed)
[facility] OFFICE PROGRESS NOTE  [name], MD [address] [place] [address]  DIAGNOSIS: stage IIIa (T2a, N2, M0) non-small cell lung cancer, adenocarcinoma.  Patient presented with a left upper lobe nodule. He was diagnosed in March 2021  Biomarkers/PDL1:  Detected EGFR mutation in exon 5 o Positive for the EGFR P848L mutation in exon 21. No mutation was detected in exons 12, 18, 19, and 20. The clinical significance of P848L in exon 21 is uncertain. This variant has been reported to behave like functionally silent polymorphism. [name], M.M., et al. Mol Cancer 6, 56 (2007). o PD-L1 Expression Level: TPS < 1% PD-L1 Expression Status: No Expression NEGATIVE FOR ALK REARRANGEMENT  PRIOR THERAPY: robotic assisted wedge resection of the left upper lobe under the care of Dr. Servando Snare.  This was performed on 10/28/2019.  CURRENT THERAPY: Adjuvant systemic chemotherapy on the Alliance A [id number] protocol with cisplatin 70 mg/M2, pemetrexed 500 mg/M2 every 3 weeks in addition to +/- pembrolizumab 400 mg IV every 6 weeks.  Start date to be determined.   INTERVAL HISTORY: Chad Gomez 74 y.o. male returns to the clinic today for a follow-up visit.  The patient is feeling fairly well today without any concerning complaints except for persistent pain near his surgical site from his recent left upper lobectomy.  The patient is prescribed tramadol for the pain but he does not wish to take this secondary to the fear of being addicted to pain medication.  The patient states that he has been taking Tylenol but it does not help his pain.  He has an appointment with Dr. Servando Snare next week.  Otherwise he denies any recent fever, chills, night sweats, or weight loss. He reports his appetite is slowly improving. He denies any significant cough or noticeable shortness of breath.  He denies any hemoptysis.  He denies any nausea, vomiting, diarrhea, or constipation.  He  denies any unusual headaches or visual changes.  The patient continues to smoke less than a pack of cigarettes per day. At the patient's last appointment, he was interested in proceeding with the alliance trial and had molecular studies performed.  He is here today for evaluation and to review the results of his molecular studies and to have a more detailed discussion about his recommended treatment options.  MEDICAL HISTORY: Past Medical History:  Diagnosis Date  . BPH (benign prostatic hypertrophy)    weak stream  . Chronic kidney disease   . Complication of anesthesia    urinary retention  . COPD (chronic obstructive pulmonary disease) ([place])   . Diabetes mellitus without complication ([place])    Type II  . History of bladder stone   . History of colon polyps    08/2013   pre-canerous  . History of kidney stones   . History of melanoma excision    LEFT EAR  . Hyperlipidemia   . Hypertension   . Iron deficiency   . Lung cancer, lingula ([place]) 11/01/2019  . Open wound    left lower jaw; followed by ENT  . Peripheral vascular disease ([place])   . Right ureteral stone   . Shingles 05-15-2015    ALLERGIES:  is allergic to morphine and related; tape; amoxicillin; and naproxen.  MEDICATIONS:  Current Outpatient Medications  Medication Sig Dispense Refill  . acetaminophen (TYLENOL) 500 MG tablet Take 2 tablets (1,000 mg total) by mouth every 6 (six) hours as needed for mild pain. 30 tablet 0  .  amLODipine (NORVASC) 10 MG tablet Take 10 mg by mouth daily.    Marland Kitchen aspirin EC 81 MG tablet Take 81 mg by mouth daily.    Marland Kitchen atorvastatin (LIPITOR) 40 MG tablet Take 40 mg by mouth daily.    . cetirizine (ZYRTEC) 10 MG tablet Take 10 mg by mouth daily.    . Cholecalciferol (VITAMIN D-3) 1000 units CAPS Take 1,000 Units by mouth daily.     Marland Kitchen dextromethorphan-guaiFENesin (MUCINEX DM) 30-600 MG 12hr tablet Take 1 tablet by mouth 2 (two) times daily as needed for cough.    . ezetimibe (ZETIA) 10 MG tablet  Take 5 mg by mouth daily.    . finasteride (PROSCAR) 5 MG tablet Take 5 mg by mouth daily.    . fluticasone (FLONASE) 50 MCG/ACT nasal spray Place 2 sprays into both nostrils daily.     . indapamide (LOZOL) 1.25 MG tablet Take 1.25 mg by mouth daily.    Marland Kitchen LORazepam (ATIVAN) 0.5 MG tablet Take 1 tablet (0.5 mg total) by mouth every 8 (eight) hours. 2 tablet 0  . Magnesium 250 MG TABS Take 250 mg by mouth daily.     . metFORMIN (GLUCOPHAGE-XR) 500 MG 24 hr tablet Take 500-1,000 mg by mouth See admin instructions. Take 500 mg every morning and take 1000 mg at supper    . potassium citrate (UROCIT-K) 10 MEQ (1080 MG) SR tablet Take 10 mEq by mouth in the morning and at bedtime.     Marland Kitchen PROAIR HFA 108 (90 Base) MCG/ACT inhaler INHALE 2 PUFFS BY MOUTH EVERY 6 HOURS AS NEEDED FOR WHEEZING AND FOR SHORTNESS OF BREATH 9 g 0  . tamsulosin (FLOMAX) 0.4 MG CAPS capsule Take 0.4 mg by mouth in the morning and at bedtime.    . Tiotropium Bromide-Olodaterol (STIOLTO RESPIMAT) 2.5-2.5 MCG/ACT AERS Inhale 2 puffs into the lungs daily. 1 Inhaler 11  . traMADol (ULTRAM) 50 MG tablet Take 1 tablet (50 mg total) by mouth every 6 (six) hours as needed (mild pain). 30 tablet 0   No current facility-administered medications for this visit.    SURGICAL HISTORY:  Past Surgical History:  Procedure Laterality Date  . ABDOMINAL AORTAGRAM  11/01/2014   Procedure: Abdominal Aortagram;  Surgeon: Serafina Mitchell, MD;  Location: [facility];  Service: Cardiovascular;;  . AORTA - BILATERAL FEMORAL ARTERY BYPASS GRAFT N/A 12/28/2014   Procedure: AORTOBIFEMORAL BYPASS GRAFT;  Surgeon: Serafina Mitchell, MD;  Location: [facility];  Service: Vascular;  Laterality: N/A;  . BRONCHIAL BRUSHINGS  09/06/2019   Procedure: BRONCHIAL BRUSHINGS;  Surgeon: Collene Gobble, MD;  Location: [facility] ENDOSCOPY;  Service: Pulmonary;;  left upper lobe nodule  . BRONCHIAL WASHINGS  09/06/2019   Procedure: BRONCHIAL WASHINGS;  Surgeon: Collene Gobble, MD;   Location: [facility];  Service: Pulmonary;;  . COLONOSCOPY W/ POLYPECTOMY  09-13-2013  . CYSTOLITHALOPAXY OF BLADDER STONE/ TRANSRECTAL ULTRASOUND PROSTATE BX  08-19-2005  . CYSTOSCOPY W/ RETROGRADES Right 12/02/2012   Procedure: CYSTOSCOPY WITH RETROGRADE PYELOGRAM;  Surgeon: Malka So, MD;  Location[facility];  Service: Urology;  Laterality: Right;  . CYSTOSCOPY W/ RETROGRADES Bilateral 10/13/2013   Procedure: CYSTOSCOPY WITH RETROGRADE PYELOGRAM;  Surgeon: Irine Seal, MD;  Location[facility]  Service: Urology;  Laterality: Bilateral;  . CYSTOSCOPY W/ URETERAL STENT PLACEMENT Left 04/11/2014   Procedure: CYSTOSCOPY WITH STENT REPLACEMENT;  Surgeon: Malka So, MD;  Location: [facility];  Service: Urology;  Laterality:  Left;  . CYSTOSCOPY WITH LITHOLAPAXY Right 12/02/2012   Procedure: CYSTOSCOPY WITH stone extraction;  Surgeon: Malka So, MD;  Location: [facility];  Service: Urology;  Laterality: Right;  . CYSTOSCOPY WITH LITHOLAPAXY Right 10/13/2013   Procedure: CYSTOSCOPY WITH LITHOLAPAXY, removal of urethral stone with basket;  Surgeon: Irine Seal, MD;  Location: [facility];  Service: Urology;  Laterality: Right;  . CYSTOSCOPY WITH RETROGRADE PYELOGRAM, URETEROSCOPY AND STENT PLACEMENT Left 03/21/2014   Procedure: CYSTOSCOPY WITH RETROGRADE PYELOGRAM, AND LEFT STENT PLACEMENT;  Surgeon: Alexis Frock, MD;  Location: [facility];  Service: Urology;  Laterality: Left;  . CYSTOSCOPY WITH RETROGRADE PYELOGRAM, URETEROSCOPY AND STENT PLACEMENT  09/09/2016   Procedure: CYSTOSCOPY WITH RIGHT  RETROGRADE PYELOGRAM, URETEROSCOPY WITH LASER BASKET EXTRACTION OF STONES  AND STENT PLACEMENT;  Surgeon: Irine Seal, MD;  Location: [facility];  Service: Urology;;  . Consuela Mimes WITH URETEROSCOPY Left 04/11/2014   Procedure: CYSTOSCOPY WITH URETEROSCOPY/ STONE EXTRACTION;  Surgeon: Malka So, MD;  Location:  [facility];  Service: Urology;  Laterality: Left;  . FINE NEEDLE ASPIRATION  09/06/2019   Procedure: FINE NEEDLE ASPIRATION;  Surgeon: Collene Gobble, MD;  Location: [facility] ENDOSCOPY;  Service: Pulmonary;;  left upper lobe - lung  . HOLMIUM LASER APPLICATION Right 0/06/7492   Procedure: HOLMIUM LASER APPLICATION;  Surgeon: Malka So, MD;  Location: [facility];  Service: Urology;  Laterality: Right;  . HOLMIUM LASER APPLICATION Left 49/67/5916   Procedure: HOLMIUM LASER APPLICATION;  Surgeon: Malka So, MD;  Location: [facility];  Service: Urology;  Laterality: Left;  . HOLMIUM LASER APPLICATION Right 3/84/6659   Procedure: HOLMIUM LASER APPLICATION;  Surgeon: Irine Seal, MD;  Location: [facility];  Service: Urology;  Laterality: Right;  . INGUINAL HERNIA REPAIR Right 1954  . INTERCOSTAL NERVE BLOCK Left 10/28/2019   Procedure: Intercostal Nerve Block;  Surgeon: [name], MD;  Location: [facility];  Service: Thoracic;  Laterality: Left;  . LITHOTRIPSY  2007 & 02/2013  . LUNG BIOPSY  09/06/2019   Procedure: LUNG BIOPSY;  Surgeon: Collene Gobble, MD;  Location: [facility] ENDOSCOPY;  Service: Pulmonary;;  left upper lob lung  . [place]  . MICROLARYNGOSCOPY Right 02/18/2016   Procedure: MICRO DIRECT LARYNGOSCOPY EXCISIONAL BIOPSY OF RIGHT VOCAL CORD MASS;  Surgeon: Leta Baptist, MD;  Location: [facility];  Service: ENT;  Laterality: Right;  . [place]   left ear  . MULTIPLE TOOTH EXTRACTIONS  1994  . NEPHROLITHOTOMY Left 01/27/2013   Procedure: NEPHROLITHOTOMY PERCUTANEOUS FIRST LOOK;  Surgeon: Malka So, MD;  Location: [facility];  Service: Urology;  Laterality: Left;  . NODE DISSECTION  10/28/2019   Procedure: Node Dissection;  Surgeon: [name], MD;  Location: [facility] OR;  Service: Thoracic;;  . PERIPHERAL VASCULAR CATHETERIZATION N/A 11/01/2014   Procedure: Lower Extremity Angiography;   Surgeon: Serafina Mitchell, MD;  Location: [place] CV [facility];  Service: Cardiovascular;  Laterality: N/A;  . URETEROSCOPY Right 12/02/2012   Procedure: RIGHT URETEROSCOPY STONE EXTRACTION WITH  STENT PLACEMENT;  Surgeon: Malka So, MD;  Location: [facility];  Service: Urology;  Laterality: Right;  Marland Kitchen VIDEO BRONCHOSCOPY  10/28/2019   VIDEO BRONCHOSCOPY WITH ENDOBRONCHIAL NAVIGATION WITH LUNG MARKINGS (N/A  . VIDEO BRONCHOSCOPY WITH ENDOBRONCHIAL NAVIGATION N/A 09/06/2019   Procedure: VIDEO BRONCHOSCOPY WITH ENDOBRONCHIAL NAVIGATION;  Surgeon: Collene Gobble, MD;  Location: [facility] ENDOSCOPY;  Service: Pulmonary;  Laterality: N/A;  . VIDEO BRONCHOSCOPY WITH ENDOBRONCHIAL NAVIGATION N/A 10/28/2019   Procedure: VIDEO BRONCHOSCOPY WITH ENDOBRONCHIAL NAVIGATION WITH LUNG MARKINGS;  Surgeon: [name], MD;  Location: [facility];  Service: Thoracic;  Laterality: N/A;  . WEDGE RESECTION  10/28/2019   LEFT UPPER LOBE    REVIEW OF SYSTEMS:   Review of Systems  Constitutional: Negative for appetite change, chills, fatigue, fever and unexpected weight change.  HENT:   Negative for mouth sores, nosebleeds, sore throat and trouble swallowing.   Eyes: Negative for eye problems and icterus.  Respiratory: Negative for cough, hemoptysis, shortness of breath and wheezing.   Cardiovascular: Positive for chest discomfort secondary to recent chest tube and surgical incision sites. Negative for leg swelling.  Gastrointestinal: Negative for abdominal pain, constipation, diarrhea, nausea and vomiting.  Genitourinary: Negative for bladder incontinence, difficulty urinating, dysuria, frequency and hematuria.   Musculoskeletal: Negative for back pain, gait problem, neck pain and neck stiffness.  Skin: Negative for itching and rash.  Neurological: Negative for dizziness, extremity weakness, gait problem, headaches, light-headedness and seizures.  Hematological: Negative for adenopathy. Does not bruise/bleed  easily.  Psychiatric/Behavioral: Negative for confusion, depression and sleep disturbance. The patient is not nervous/anxious.     PHYSICAL EXAMINATION:  Blood pressure 112/73, pulse 95, temperature (!) 97.5 F (36.4 C), temperature source Temporal, resp. rate 17, height _0  (1.778 m), weight 168 lb 8 oz (76.4 kg), SpO2 97 %.  ECOG PERFORMANCE STATUS: 1 - Symptomatic but completely ambulatory  Physical Exam  Constitutional: Oriented to person, place, and time and well-developed, well-nourished, and in no distress.  HENT:  Head: Normocephalic and atraumatic.  Mouth/Throat: Oropharynx is clear and moist. No oropharyngeal exudate.  Eyes: Conjunctivae are normal. Right eye exhibits no discharge. Left eye exhibits no discharge. No scleral icterus.  Neck: Normal range of motion. Neck supple.  Cardiovascular: Normal rate, regular rhythm, normal heart sounds and intact distal pulses.   Pulmonary/Chest: Effort normal. Decreased breath sounds in all lung fields. No wheezes. No rales. Chest tube in place. Abdominal: Soft. Bowel sounds are normal. Exhibits no distension and no mass. There is no tenderness.  Musculoskeletal: Normal range of motion. Exhibits no edema.  Lymphadenopathy:    No cervical adenopathy.  Neurological: Alert and oriented to person, place, and time. Exhibits normal muscle tone. Gait normal. Coordination normal.  Skin: healing surgical scars secondary to recent wedge resection. Skin is warm and dry. No rash noted. Not diaphoretic. No erythema. No pallor.  Psychiatric: Mood, memory and judgment normal.  Vitals reviewed.  LABORATORY DATA: Lab Results  Component Value Date   WBC 9.1 11/09/2019   HGB 16.4 11/09/2019   HCT 48.5 11/09/2019   MCV 92.0 11/09/2019   PLT 273 11/09/2019      Chemistry      Component Value Date/Time   NA 135 11/09/2019 1310   K 4.2 11/09/2019 1310   CL 96 (L) 11/09/2019 1310   CO2 30 11/09/2019 1310   BUN 19 11/09/2019 1310   CREATININE  1.09 11/09/2019 1310      Component Value Date/Time   CALCIUM 10.3 11/09/2019 1310   ALKPHOS 93 11/09/2019 1310   AST 14 (L) 11/09/2019 1310   ALT 14 11/09/2019 1310   BILITOT 0.5 11/09/2019 1310       RADIOGRAPHIC STUDIES:  DG Chest 2 View  Result Date: 11/17/2019 CLINICAL DATA:  History of lung cancer short of breath EXAM: CHEST - 2 VIEW  COMPARISON:  11/10/2019, 11/05/2019, 10/28/2019 FINDINGS: Removal of left-sided chest tube. Left perihilar postsurgical changes. No acute consolidation or pleural effusion. The previously noted small left apical pneumothorax is not identified. Moderate residual soft tissue emphysema within the supraclavicular regions and left chest wall, overall decreased as compared with 11/10/2019. IMPRESSION: 1. Removal of left chest tube. Previously noted left apical pneumothorax is not identified 2. Moderate residual subcutaneous emphysema but overall decreased as compared with 11/10/2019 3. Left postsurgical changes Electronically Signed   By: Donavan Foil M.D.   On: 11/17/2019 17:05   DG Chest 2 View  Result Date: 11/10/2019 CLINICAL DATA:  Lung cancer. EXAM: CHEST - 2 VIEW COMPARISON:  11/05/2019. FINDINGS: Left chest tube noted in stable position. Improved left-sided pneumothorax with mild left apical residual. Mild left base atelectasis. Heart size normal. Bilateral neck and diffuse left chest wall subcutaneous emphysema again noted. IMPRESSION: Left chest tube in stable position. Interim improvement of left pneumothorax with mild left apical residual. Diffuse subcutaneous emphysema again noted. Electronically Signed   By: Marcello Moores  Register   On: 11/10/2019 08:50   DG Chest 2 View  Result Date: 11/05/2019 CLINICAL DATA:  Status post left upper lobe wedge resection, left chest tube, subcutaneous emphysema EXAM: CHEST - 2 VIEW COMPARISON:  1421 FINDINGS: Stable left chest tube position. Small left lateral basilar pneumothorax visible on today's exam close to the  chest tube site. Similar diffuse left chest and neck subcutaneous emphysema pattern. No new airspace process, collapse or consolidation. Negative for edema or effusion. Normal heart size and vascularity. Aorta atherosclerotic. IMPRESSION: Small left lateral basilar pneumothorax. Otherwise stable postoperative appearance. Electronically Signed   By: Jerilynn Mages.  Shick M.D.   On: 11/05/2019 09:51   DG Chest Port 1 View  Result Date: 11/04/2019 CLINICAL DATA:  Subcutaneous emphysema. Status post left upper lobe wedge resection. EXAM: PORTABLE CHEST 1 VIEW COMPARISON:  Chest x-ray from yesterday. FINDINGS: Unchanged left chest tube. The heart size and mediastinal contours are within normal limits. Normal pulmonary vascularity. The lungs remain hyperinflated with emphysematous changes. No focal consolidation, pleural effusion, or pneumothorax. Relatively unchanged extensive subcutaneous emphysema. No acute osseous abnormality. IMPRESSION: 1. Relatively unchanged extensive subcutaneous emphysema. Left chest tube remains in place without visible pneumothorax. Electronically Signed   By: Titus Dubin M.D.   On: 11/04/2019 08:10   DG Chest Port 1 View  Result Date: 11/03/2019 CLINICAL DATA:  Chest tube EXAM: PORTABLE CHEST 1 VIEW COMPARISON:  Yesterday FINDINGS: Left chest tube in place. Extensive chest wall emphysema asymmetric to the left. No visible superimposed pneumothorax. Emphysema. Stable aortic and hilar contours distorted by rotation. IMPRESSION: Extensive soft tissue emphysema without visible pneumothorax. Electronically Signed   By: Monte Fantasia M.D.   On: 11/03/2019 08:48   DG Chest Port 1 View  Result Date: 11/02/2019 CLINICAL DATA:  Recent left upper lobe wedge resection. EXAM: PORTABLE CHEST 1 VIEW COMPARISON:  Chest x-ray from yesterday. FINDINGS: Unchanged left chest tube. No pneumothorax. Increasing subcutaneous emphysema in the left chest wall and left greater than right lower neck. Postsurgical  changes in the left upper lobe. The lungs are hyperinflated but clear. No pleural effusion. The heart size and mediastinal contours are within normal limits. Normal pulmonary vascularity. No acute osseous abnormality. IMPRESSION: 1. Increasing subcutaneous emphysema.  No pneumothorax. Electronically Signed   By: Titus Dubin M.D.   On: 11/02/2019 09:11   DG CHEST PORT 1 VIEW  Result Date: 11/01/2019 CLINICAL DATA:  Partial lobectomy EXAM: PORTABLE CHEST  1 VIEW COMPARISON:  Yesterday FINDINGS: Left chest wall emphysema appears similar to prior. No visible pneumothorax. Chest tube in place. Normal heart size. Mild atelectasis at the right base. Emphysema. IMPRESSION: Stable chest wall emphysema.  No visible pneumothorax. Electronically Signed   By: Monte Fantasia M.D.   On: 11/01/2019 09:04     ASSESSMENT/PLAN:  This is a very pleasant 74 year old Caucasian male referred to the clinic for evaluation of stage IIIa (T2a, N2, M0) non-small cell lung cancer, adenocarcinoma.  Patient presented with a left upper lobe nodule. He was diagnosed in March 2021  The patient is status post a robotic assisted wedge resection of the left upper lobe under the care of Dr. Servando Snare.  This was performed on 10/28/2019.  The patient was seen with Dr. Julien Nordmann today.  Dr. Julien Nordmann reviewed the results of the patient's recent molecular studies.  The patient does not have any actionable mutations.  However, if the patient is interested, he may enroll in the study on the study arm that includes the standard of care treatment which would be 4 cycles of adjuvant cisplatin and Alimta in combination with immunotherapy with Keytruda.  The patient was given the option of enrolling in the research study vs. proceeding with standard of care chemotherapy with cisplatin and Alimta for 4 cycles vs. continuing observation without any treatment.  Dr. Julien Nordmann would not recommend that the patient continue on observation due to patient being  diagnosed with a higher stage malignancy (stage IIIa).   The patient met with a member of the clinical research team.  The patient is interested in proceeding with a clinical trial with standard of care treatment with cisplatin and Alimta in addition to immunotherapy with Keytruda.   We will arrange for the patient to start his treatment at least 6 to 8 weeks from his surgery.  We will arrange for the patient to have a chemo education class prior to receiving his first cycle of treatment.   The patient start date is to be determined based on making sure that the study requirements of the protocol are fulfilled.   I have sent prescription for folic acid 1 mg p.o. daily to the patient's pharmacy.  I sent a prescription for Compazine 10 mg every 6 hours as needed to the patient's pharmacy.  I will arrange for vitamin B12 injection to be administered in the clinic closer to the start date of his adjuvant treatment.  The patient is scheduled to have his staging brain MRI performed on 12/15/2019.  I stressed the importance of keeping this appointment which is required for the staging work-up as well as for the research study.  The patient is claustrophobic and is having his brain MRI performed at [facility] in the open MRI machine.  I also previously sent him a prescription for Ativan 0.5 mg to his pharmacy to take prior to his MRI.  The patient was advised to call immediately if he has any concerning symptoms in the interval. The patient voices understanding of current disease status and treatment options and is in agreement with the current care plan. All questions were answered. The patient knows to call the clinic with any problems, questions or concerns. We can certainly see the patient much sooner if necessary  No orders of the defined types were placed in this encounter.    Penda Venturi L Laurel Smeltz, PA-C 11/30/19  ADDENDUM: Hematology/Oncology Attending: I had a face-to-face  encounter with the patient today.  I recommended his care plan.  This is a very pleasant 74 years old white male recently diagnosed with a stage IIIa non-small cell lung cancer, adenocarcinoma status post wedge resection of the left lower lobe as well as mediastinal lymph node dissection and the final pathology showed invasive adenocarcinoma measuring 3.5 cm with visceral pleural invasion as well as metastatic adenocarcinoma to 4L lymph node.  The patient was enrolled in the [facility] clinical trial but he has no actionable mutation.  The only positive mutation was EGFR mutation in exon 21 with P848L which is not currently an actionable mutation. We had a lengthy discussion with the patient today about his current condition and treatment options.  The patient was given the option of extended adjuvant systemic chemotherapy with cisplatin and Alimta versus enrollment in the alliance clinical trial where the patient will be randomized for treatment with either standard of care versus addition of pembrolizumab versus chemotherapy followed by pembrolizumab.  He was also given the option of observation and close monitoring. The patient was interested in the clinical trial and he will be seen by the research clinical nurse later today for evaluation and discussion of this option. We will arrange for the patient to have MRI of the brain as well as the necessary repeat imaging studies and lab work for the trial. We will arrange for the patient to the follow-up visit and the start of the treatment based on the study protocol.  He continues to have persistent soreness and pain on the left side from the surgical incision and he will discuss with Dr. Servando Snare during the upcoming visit next week. The patient was advised to call immediately if he has any concerning symptoms in the interval.  Disclaimer: This note was dictated with voice recognition software. Similar sounding words can inadvertently be transcribed and may be  missed upon review. Eilleen Kempf, MD 11/30/19

## 2019-12-01 LAB — RESEARCH LABS

## 2019-12-02 ENCOUNTER — Ambulatory Visit: Payer: Self-pay | Admitting: *Deleted

## 2019-12-05 ENCOUNTER — Ambulatory Visit: Payer: Self-pay | Admitting: *Deleted

## 2019-12-06 ENCOUNTER — Other Ambulatory Visit: Payer: Self-pay | Admitting: Physician Assistant

## 2019-12-06 DIAGNOSIS — C3410 Malignant neoplasm of upper lobe, unspecified bronchus or lung: Secondary | ICD-10-CM

## 2019-12-07 ENCOUNTER — Other Ambulatory Visit: Payer: Self-pay | Admitting: Cardiothoracic Surgery

## 2019-12-07 ENCOUNTER — Telehealth: Payer: Self-pay

## 2019-12-07 DIAGNOSIS — C3490 Malignant neoplasm of unspecified part of unspecified bronchus or lung: Secondary | ICD-10-CM

## 2019-12-07 NOTE — Telephone Encounter (Signed)
-----   Message from [place], PA-C sent at 12/07/2019  3:42 PM EDT ----- Can you guys call the patient and make sure he is aware of his appointments and why. He is a research patient and will be on a clinical trial. Due to being on the trial, there are a lot of studies that need to be done before he starts.   1) Please let him we are planning to start his treatment on 6/28. He can start taking his folic acid around this time.  2) We made him a lab appointment on 6/24 to get a b12 injection. This needs to be done prior to his treatment about the week before. If he does not want to have to come back here on that day then we can reschedule the 6/25 if it is too much for him to do that on the same day as the MRI. I forgot that I arranged for the MRI at [facility] and not [facility]. He is claustrophobic. He knows that he HAS to get the MRI to be eligible. I already sent ativan to his pharmacy.  3) Just re-explain to him what chemoeducation class is and confirm appointment time. They will discuss all of possible trial arms of the study and drugs to him. We already discussed a lot of that though.  ----- Message ----- From: Nicholaus Corolla Sent: 12/07/2019   7:54 AM EDT To: Tobe Sos Heilingoetter, PA-C  LAB ONLY Department: [facility] Provider: [facility] 1 Appointment Notes: Please schedule the following: 1) Labs and Injection (vitamin B12) on 6/24 before the brain MRI 2) Chemo education class for Cisplatin and Alimta +/- Beryle Flock (he is on a research study) before his first cycle of treatment 3) Labs, return visit (Dr. Julien Nordmann only, I cannot see research patient's), and infusion on 6/28 4) Will need weekly labs starting the week after his first treatment.   Please message me once these appointments are made. Thank you.   Appts are scheduled. Only scheduled one cycle - no treatment plan in yet , so I wasn't sure how often the treatments were .

## 2019-12-07 NOTE — Telephone Encounter (Signed)
Called reviewed up coming appts with [facility]

## 2019-12-08 ENCOUNTER — Other Ambulatory Visit: Payer: Self-pay | Admitting: *Deleted

## 2019-12-08 ENCOUNTER — Other Ambulatory Visit: Payer: Self-pay

## 2019-12-08 ENCOUNTER — Ambulatory Visit: Payer: Self-pay | Admitting: *Deleted

## 2019-12-08 ENCOUNTER — Encounter: Payer: Self-pay | Admitting: Cardiothoracic Surgery

## 2019-12-08 ENCOUNTER — Ambulatory Visit (INDEPENDENT_AMBULATORY_CARE_PROVIDER_SITE_OTHER): Payer: Self-pay | Admitting: Cardiothoracic Surgery

## 2019-12-08 ENCOUNTER — Ambulatory Visit
Admission: RE | Admit: 2019-12-08 | Discharge: 2019-12-08 | Disposition: A | Discharge status: Home / Self Care | Payer: Medicare Other | Source: Ambulatory Visit | Attending: Cardiothoracic Surgery | Admitting: Cardiothoracic Surgery

## 2019-12-08 VITALS — BP 118/77 | HR 100 | Temp 96.8°F | Resp 20 | Ht 70.0 in | Wt 170.4 lb

## 2019-12-08 DIAGNOSIS — Z09 Encounter for follow-up examination after completed treatment for conditions other than malignant neoplasm: Secondary | ICD-10-CM

## 2019-12-08 DIAGNOSIS — J439 Emphysema, unspecified: Secondary | ICD-10-CM | POA: Diagnosis not present

## 2019-12-08 DIAGNOSIS — C3410 Malignant neoplasm of upper lobe, unspecified bronchus or lung: Secondary | ICD-10-CM

## 2019-12-08 DIAGNOSIS — C3492 Malignant neoplasm of unspecified part of left bronchus or lung: Secondary | ICD-10-CM

## 2019-12-08 DIAGNOSIS — C3490 Malignant neoplasm of unspecified part of unspecified bronchus or lung: Secondary | ICD-10-CM

## 2019-12-08 MED ORDER — TRAMADOL HCL 50 MG PO TABS: 50.0000 mg | ORAL_TABLET | Freq: Four times a day (QID) | ORAL | 0 refills | Status: DC | PRN

## 2019-12-08 MED ORDER — ALBUTEROL SULFATE HFA 108 (90 BASE) MCG/ACT IN AERS
2.0000 | INHALATION_SPRAY | Freq: Four times a day (QID) | RESPIRATORY_TRACT | 3 refills | Status: DC | PRN
Start: 2019-12-08 — End: 2021-05-06

## 2019-12-08 NOTE — Progress Notes (Signed)
[address]       [place], [address]             [phone]      Chad Gomez Medical Record #[id number] Date of Birth: [birth year]  Referring: Collene Gobble, MD Primary Care: [name], MD Primary Cardiologist: Minus Breeding, MD   Chief Complaint:   POST OP FOLLOW UP OPERATIVE REPORT DATE OF PROCEDURE:  10/28/2019 PREOPERATIVE DIAGNOSIS:  Non-small-cell lung cancer, left upper lobe lingula. POSTOPERATIVE DIAGNOSIS:  Non-small-cell lung cancer, left upper lobe lingula. SURGICAL PROCEDURES:   1.  Bronchoscopy with navigation bronchoscopy and marking of the left lung lesion. 2.  Robotic-assisted wedge resection of lingula with lymph node biopsy and intercostal nerve blocks.  Cancer Staging Lung cancer, lingula ([place]) Staging form: Lung, AJCC 8th Edition - Pathologic stage from 11/01/2019: Stage IIIA (pT2a, pN2, cM0) - Signed by [name], MD on 11/01/2019  History of Present Illness:     Patient returns today for follow-up visit after recent discharge from the hospital.  He underwent robotic assisted wedge resection of the lingula with lymph node biopsy and intercostal nerve block, postop he had a prolonged air leak and was discharged home with chest tube in place.  The chest tube was removed in office 4 weeks ago.  Patient comes in today with a follow-up chest x-ray.  He he denies any fever chills.  \ Biomarkers/PDL1:  Detected EGFR mutation in exon 21 ? Positive for the EGFR P848L mutation in exon 21. No mutation was detected in exons 12, 18, 19, and 20. The clinical significance of P848L in exon 21 is uncertain. This variant has been reported to behave like functionally silent polymorphism. [name], M.M., et al. Mol Cancer 6, 56 (2007). ? PD-L1 Expression Level: TPS < 1% PD-L1 Expression Status: No Expression NEGATIVE FOR ALK REARRANGEMENT    To start chemotherapy next week  CURRENT THERAPY: Adjuvant systemic  chemotherapy on the Alliance A [id number] protocol with cisplatin 70 mg/M2, pemetrexed 500 mg/M2 every 3 weeks in addition to +/- pembrolizumab 400 mg IV every 6 weeks.     Past Medical History:  Diagnosis Date  . BPH (benign prostatic hypertrophy)    weak stream  . Chronic kidney disease   . Complication of anesthesia    urinary retention  . COPD (chronic obstructive pulmonary disease) ([place])   . Diabetes mellitus without complication ([place])    Type II  . History of bladder stone   . History of colon polyps    08/2013   pre-canerous  . History of kidney stones   . History of melanoma excision    LEFT EAR  . Hyperlipidemia   . Hypertension   . Iron deficiency   . Lung cancer, lingula ([place]) 11/01/2019  . Open wound    left lower jaw; followed by ENT  . Peripheral vascular disease ([place])   . Right ureteral stone   . Shingles 05-15-2015     Social History   Tobacco Use  Smoking Status Current Every Day Smoker  . Packs/day: 1.00  . Years: 59.00  . Pack years: 59.00  . Types: Cigarettes  . Start date: 06/24/1959  Smokeless Tobacco Never Used    Social History   Substance and Sexual Activity  Alcohol Use No  . Alcohol/week: 0.0 standard drinks     Allergies  Allergen Reactions  . Morphine And Related Anxiety and Other (See Comments)    Combative,  incoherent    . Tape Other (See Comments) and Rash    Surgical tape Adhesive tape causes blisters  . Amoxicillin Other (See Comments)    Severe stomach pain  . Naproxen Swelling    Eyes swell    Current Outpatient Medications  Medication Sig Dispense Refill  . acetaminophen (TYLENOL) 500 MG tablet Take 2 tablets (1,000 mg total) by mouth every 6 (six) hours as needed for mild pain. 30 tablet 0  . amLODipine (NORVASC) 10 MG tablet Take 10 mg by mouth daily.    Marland Kitchen aspirin EC 81 MG tablet Take 81 mg by mouth daily.    Marland Kitchen atorvastatin (LIPITOR) 40 MG tablet Take 40 mg by mouth daily.    . cetirizine (ZYRTEC) 10 MG tablet Take  10 mg by mouth daily.    . Cholecalciferol (VITAMIN D-3) 1000 units CAPS Take 1,000 Units by mouth daily.     Marland Kitchen dextromethorphan-guaiFENesin (MUCINEX DM) 30-600 MG 12hr tablet Take 1 tablet by mouth 2 (two) times daily as needed for cough.    . ezetimibe (ZETIA) 10 MG tablet Take 5 mg by mouth daily.    . finasteride (PROSCAR) 5 MG tablet Take 5 mg by mouth daily.    . fluticasone (FLONASE) 50 MCG/ACT nasal spray Place 2 sprays into both nostrils daily.     . folic acid (FOLVITE) 1 MG tablet Take 1 tablet (1 mg total) by mouth daily. 30 tablet 2  . indapamide (LOZOL) 1.25 MG tablet Take 1.25 mg by mouth daily.    Marland Kitchen LORazepam (ATIVAN) 0.5 MG tablet Take 1 tablet (0.5 mg total) by mouth every 8 (eight) hours. 2 tablet 0  . Magnesium 250 MG TABS Take 250 mg by mouth daily.     . metFORMIN (GLUCOPHAGE-XR) 500 MG 24 hr tablet Take 500-1,000 mg by mouth See admin instructions. Take 500 mg every morning and take 1000 mg at supper    . potassium citrate (UROCIT-K) 10 MEQ (1080 MG) SR tablet Take 10 mEq by mouth in the morning and at bedtime.     Marland Kitchen PROAIR HFA 108 (90 Base) MCG/ACT inhaler INHALE 2 PUFFS BY MOUTH EVERY 6 HOURS AS NEEDED FOR WHEEZING AND FOR SHORTNESS OF BREATH 9 g 0  . prochlorperazine (COMPAZINE) 10 MG tablet Take 1 tablet (10 mg total) by mouth every 6 (six) hours as needed. 30 tablet 2  . tamsulosin (FLOMAX) 0.4 MG CAPS capsule Take 0.4 mg by mouth in the morning and at bedtime.    . Tiotropium Bromide-Olodaterol (STIOLTO RESPIMAT) 2.5-2.5 MCG/ACT AERS Inhale 2 puffs into the lungs daily. 1 Inhaler 11  . traMADol (ULTRAM) 50 MG tablet Take 1 tablet (50 mg total) by mouth every 6 (six) hours as needed (mild pain). 30 tablet 0  . albuterol (VENTOLIN HFA) 108 (90 Base) MCG/ACT inhaler Inhale 2 puffs into the lungs every 6 (six) hours as needed for wheezing or shortness of breath. 8 g 3   No current facility-administered medications for this visit.       Physical Exam: BP 118/77 (BP  Location: Right Arm, Patient Position: Sitting, Cuff Size: Normal)   Pulse 100   Temp (!) 96.8 F (36 C) (Temporal)   Resp 20   Ht 5' 10"  (1.778 m)   Wt 170 lb 6.4 oz (77.3 kg)   SpO2 95% Comment: RA  BMI 24.45 kg/m   General appearance: alert, cooperative and no distress Neck: no adenopathy, no carotid bruit, no JVD, supple, symmetrical, trachea midline  and thyroid not enlarged, symmetric, no tenderness/mass/nodules Lymph nodes: Cervical, supraclavicular, and axillary nodes normal. Resp: clear to auscultation bilaterally Cardio: regular rate and rhythm, S1, S2 normal, no murmur, click, rub or gallop GI: soft, non-tender; bowel sounds normal; no masses,  no organomegaly Extremities: extremities normal, atraumatic, no cyanosis or edema and Homans sign is negative, no sign of DVT Neurologic: Grossly normal Subcu air is completely resolved, incisions are well-healed  Diagnostic Studies & Laboratory data:     Recent Radiology Findings:   DG Chest 2 View  Result Date: 12/08/2019 CLINICAL DATA:  Lung cancer status post left upper lobe wedge resection on 10/28/2019. No current complaints. EXAM: CHEST - 2 VIEW COMPARISON:  11/17/2019 FINDINGS: The cardiomediastinal silhouette is unchanged with normal heart size. Aortic atherosclerosis is noted. The lungs are hyperinflated. Postsurgical changes are noted in the anterior left mid lung. No acute airspace consolidation, edema, pleural effusion, pneumothorax is identified. Soft tissue emphysema in the left chest wall has nearly completely resolved. No acute osseous abnormality is seen. IMPRESSION: Postsurgical changes with near complete resolution of subcutaneous emphysema. No evidence of acute cardiopulmonary process. Electronically Signed   By: Logan Bores M.D.   On: 12/08/2019 09:12    I have independently reviewed the above radiology studies  and reviewed the findings with the patient.    Recent Lab Findings: Lab Results  Component Value  Date   WBC 9.1 11/09/2019   HGB 16.4 11/09/2019   HCT 48.5 11/09/2019   PLT 273 11/09/2019   GLUCOSE 126 (H) 11/09/2019   ALT 14 11/09/2019   AST 14 (L) 11/09/2019   NA 135 11/09/2019   K 4.2 11/09/2019   CL 96 (L) 11/09/2019   CREATININE 1.09 11/09/2019   BUN 19 11/09/2019   CO2 30 11/09/2019   INR 1.1 10/26/2019      Assessment / Plan:    Patient continues to improve following robotic assisted wedge resection left upper lobe  Still with some paresthesias over the left chest wall, need for pain medicine has decreased but still takes 1 tramadol late in the day or near bedtime-was given a refill  To start chemotherapy next week per Dr. Earlie Server  - Adjuvant systemic chemotherapy on the Alliance A [id number] protocol with cisplatin 70 mg/M2, pemetrexed 500 mg/M2 every 3 weeks in addition to +/- pembrolizumab 400 mg IV every 6 weeks.   Medication Changes: Meds ordered this encounter  Medications  . albuterol (VENTOLIN HFA) 108 (90 Base) MCG/ACT inhaler    Sig: Inhale 2 puffs into the lungs every 6 (six) hours as needed for wheezing or shortness of breath.    Dispense:  8 g    Refill:  3  . traMADol (ULTRAM) 50 MG tablet    Sig: Take 1 tablet (50 mg total) by mouth every 6 (six) hours as needed (mild pain).    Dispense:  30 tablet    Refill:  0   Follow-up CT scans per medical oncology Plan to see the patient back in 3 months  [name] MD      [address] ,[place] [address] Office [phone]     12/08/2019 9:56 AM

## 2019-12-09 ENCOUNTER — Ambulatory Visit: Payer: Self-pay | Admitting: *Deleted

## 2019-12-09 ENCOUNTER — Telehealth: Payer: Self-pay

## 2019-12-12 DIAGNOSIS — R338 Other retention of urine: Secondary | ICD-10-CM | POA: Diagnosis not present

## 2019-12-13 DIAGNOSIS — Z85118 Personal history of other malignant neoplasm of bronchus and lung: Secondary | ICD-10-CM | POA: Diagnosis not present

## 2019-12-13 DIAGNOSIS — J439 Emphysema, unspecified: Secondary | ICD-10-CM | POA: Diagnosis not present

## 2019-12-13 DIAGNOSIS — I7090 Unspecified atherosclerosis: Secondary | ICD-10-CM | POA: Diagnosis not present

## 2019-12-13 DIAGNOSIS — E11319 Type 2 diabetes mellitus with unspecified diabetic retinopathy without macular edema: Secondary | ICD-10-CM | POA: Diagnosis not present

## 2019-12-13 NOTE — Progress Notes (Signed)
Pharmacist Chemotherapy Monitoring - Initial Assessment    Anticipated start date: 12/19/19  Regimen:  . Are orders appropriate based on the patient's diagnosis, regimen, and cycle? Yes . Does the plan date match the patient's scheduled date? Yes . Is the sequencing of drugs appropriate? Yes . Are the premedications appropriate for the patient's regimen? Yes . Prior Authorization for treatment is: Not Started o If applicable, is the correct biosimilar selected based on the patient's insurance? not applicable  Organ Function and Labs: Marland Kitchen Are dose adjustments needed based on the patient's renal function, hepatic function, or hematologic function? Yes . Are appropriate labs ordered prior to the start of patient's treatment? Yes . Other organ system assessment, if indicated: N/A . The following baseline labs, if indicated, have been ordered: pembrolizumab: baseline TSH +/- T4  Dose Assessment: . Are the drug doses appropriate? Yes . Are the following correct: o Drug concentrations Yes o IV fluid compatible with drug Yes o Administration routes Yes o Timing of therapy Yes . If applicable, does the patient have documented access for treatment and/or plans for port-a-cath placement? not applicable . If applicable, have lifetime cumulative doses been properly documented and assessed? no Lifetime Dose Tracking  No doses have been documented on this patient for the following tracked chemicals: Doxorubicin, Epirubicin, Idarubicin, Daunorubicin, Mitoxantrone, Bleomycin, Oxaliplatin, Carboplatin, Liposomal Doxorubicin  o   Toxicity Monitoring/Prevention: . The patient has the following take home antiemetics prescribed: Prochlorperazine . The patient has the following take home medications prescribed: folic acid for pemetrexed . Medication allergies and previous infusion related reactions, if applicable, have been reviewed and addressed. Yes . The patient's current medication list has been  assessed for drug-drug interactions with their chemotherapy regimen. no significant drug-drug interactions were identified on review.  Order Review: . Are the treatment plan orders signed? No . Is the patient scheduled to see a provider prior to their treatment? Yes  I verify that I have reviewed each item in the above checklist and answered each question accordingly.  Philomena Course 12/13/2019 11:43 AM

## 2019-12-15 ENCOUNTER — Encounter: Payer: Self-pay | Admitting: *Deleted

## 2019-12-15 ENCOUNTER — Inpatient Hospital Stay: Payer: Medicare Other

## 2019-12-15 ENCOUNTER — Other Ambulatory Visit: Payer: Medicare Other

## 2019-12-15 ENCOUNTER — Other Ambulatory Visit: Payer: Self-pay | Admitting: Physician Assistant

## 2019-12-15 ENCOUNTER — Other Ambulatory Visit: Payer: Self-pay

## 2019-12-15 ENCOUNTER — Ambulatory Visit
Admission: RE | Admit: 2019-12-15 | Discharge: 2019-12-15 | Disposition: A | Discharge status: Home / Self Care | Payer: Medicare Other | Source: Ambulatory Visit | Attending: Physician Assistant | Admitting: Physician Assistant

## 2019-12-15 ENCOUNTER — Telehealth: Payer: Self-pay | Admitting: *Deleted

## 2019-12-15 DIAGNOSIS — C3492 Malignant neoplasm of unspecified part of left bronchus or lung: Secondary | ICD-10-CM

## 2019-12-15 DIAGNOSIS — C3490 Malignant neoplasm of unspecified part of unspecified bronchus or lung: Secondary | ICD-10-CM | POA: Diagnosis not present

## 2019-12-15 DIAGNOSIS — Z006 Encounter for examination for normal comparison and control in clinical research program: Secondary | ICD-10-CM | POA: Diagnosis not present

## 2019-12-15 DIAGNOSIS — F40240 Claustrophobia: Secondary | ICD-10-CM

## 2019-12-15 DIAGNOSIS — C3412 Malignant neoplasm of upper lobe, left bronchus or lung: Secondary | ICD-10-CM | POA: Diagnosis present

## 2019-12-15 DIAGNOSIS — C3410 Malignant neoplasm of upper lobe, unspecified bronchus or lung: Secondary | ICD-10-CM

## 2019-12-15 LAB — CMP (CANCER CENTER ONLY)
ALT: 15 U/L (ref 0–44)
AST: 12 U/L — ABNORMAL LOW (ref 15–41)
Albumin: 4.3 g/dL (ref 3.5–5.0)
Alkaline Phosphatase: 87 U/L (ref 38–126)
Anion gap: 9 (ref 5–15)
BUN: 9 mg/dL (ref 8–23)
CO2: 32 mmol/L (ref 22–32)
Calcium: 11.4 mg/dL — ABNORMAL HIGH (ref 8.9–10.3)
Chloride: 97 mmol/L — ABNORMAL LOW (ref 98–111)
Creatinine: 0.99 mg/dL (ref 0.61–1.24)
GFR, Est AFR Am: >60 mL/min (ref >60)
GFR, Estimated: >60 mL/min (ref >60)
Glucose, Bld: 119 mg/dL — ABNORMAL HIGH (ref 70–99)
Potassium: 3.8 mmol/L (ref 3.5–5.1)
Sodium: 138 mmol/L (ref 135–145)
Total Bilirubin: 0.7 mg/dL (ref 0.3–1.2)
Total Protein: 7.1 g/dL (ref 6.5–8.1)

## 2019-12-15 LAB — CBC WITH DIFFERENTIAL (CANCER CENTER ONLY)
Abs Immature Granulocytes: 0.02 10*3/uL (ref 0.00–0.07)
Basophils Absolute: 0.1 10*3/uL (ref 0.0–0.1)
Basophils Relative: 1 %
Eosinophils Absolute: 0.2 10*3/uL (ref 0.0–0.5)
Eosinophils Relative: 3 %
HCT: 48.5 % (ref 39.0–52.0)
Hemoglobin: 16.6 g/dL (ref 13.0–17.0)
Immature Granulocytes: 0 %
Lymphocytes Relative: 21 %
Lymphs Abs: 1.6 10*3/uL (ref 0.7–4.0)
MCH: 31.4 pg (ref 26.0–34.0)
MCHC: 34.2 g/dL (ref 30.0–36.0)
MCV: 91.7 fL (ref 80.0–100.0)
Monocytes Absolute: 0.9 10*3/uL (ref 0.1–1.0)
Monocytes Relative: 12 %
Neutro Abs: 4.7 10*3/uL (ref 1.7–7.7)
Neutrophils Relative %: 63 %
Platelet Count: 244 10*3/uL (ref 150–400)
RBC: 5.29 MIL/uL (ref 4.22–5.81)
RDW: 13.3 % (ref 11.5–15.5)
WBC Count: 7.4 10*3/uL (ref 4.0–10.5)
nRBC: 0 % (ref 0.0–0.2)

## 2019-12-15 LAB — MAGNESIUM: Magnesium: 1.5 mg/dL — ABNORMAL LOW (ref 1.7–2.4)

## 2019-12-15 LAB — TSH: TSH: 1.788 u[IU]/mL (ref 0.320–4.118)

## 2019-12-15 MED ORDER — GADOBENATE DIMEGLUMINE 529 MG/ML IV SOLN
16.0000 mL | Freq: Once | INTRAVENOUS | Status: AC | PRN
  Administered 2019-12-15: 16 mL via INTRAVENOUS

## 2019-12-15 MED ORDER — CYANOCOBALAMIN 1000 MCG/ML IJ SOLN
1000.0000 ug | Freq: Once | INTRAMUSCULAR | Status: AC
  Administered 2019-12-15: 1000 ug via INTRAMUSCULAR

## 2019-12-15 MED ORDER — MAGNESIUM OXIDE 400 (241.3 MG) MG PO TABS
400.0000 mg | ORAL_TABLET | Freq: Every day | ORAL | 1 refills | Status: DC
Start: 2019-12-15 — End: 2020-01-02

## 2019-12-15 NOTE — Telephone Encounter (Signed)
Received call from Tyrell Antonio this am stating that she met with pt & wife states that pt started Folic Acid & got sick on it & stopped it.  Message to Dr Mohamed/Pod RN

## 2019-12-15 NOTE — Research (Signed)
12/15/2019  Research - DCP-001 consent and enrollment  Copy of signed consent and authorization forms were given to patient today.   Patient met inclusion criteria for the DCP-001 study. Online registration via [facility] was completed for the Alliance [id number] ALCHEMIST study enrollment on 12/08/2019.  Cindy S. Shaw BSN, RN, CCRP 12/15/2019 10:17 AM  

## 2019-12-15 NOTE — Research (Signed)
12/15/2019  [place] [id number] screening visit  Patient into clinic today for continued screening procedures. Met with patient and wife following lab draw and explained that today's lab tests and MRI will determine if he is eligible for the trial. If so, will proceed with randomization either later today or tomorrow in anticipation of treatment on Monday 12/19/19. Patient and wife also understand that if he is not eligible for the trial, he would still proceed with standard of care chemotherapy as scheduled on Monday.   Patient denies receiving any live vaccines in the past 30 days. He denies any history of hepatitis.  Patient was given copies of the consent (Protocol Version Date 09/08/19) and authorization forms signed by the patient on 11/30/2019.  Eligibility reviewed and confirmed by Foye Spurling, pending completion of brain imaging.  Cindy S. Brigitte Pulse BSN, RN, [place] 12/15/2019 11:36 AM

## 2019-12-15 NOTE — Patient Instructions (Signed)

## 2019-12-16 ENCOUNTER — Encounter: Payer: Self-pay | Admitting: *Deleted

## 2019-12-16 ENCOUNTER — Ambulatory Visit: Payer: Self-pay | Admitting: *Deleted

## 2019-12-16 DIAGNOSIS — C3492 Malignant neoplasm of unspecified part of left bronchus or lung: Secondary | ICD-10-CM | POA: Insufficient documentation

## 2019-12-16 NOTE — Research (Signed)
12/16/2019  Research - Alliance [id number] study enrollment  MRI results show no evidence of intracranial metastases. Additionally, electrolyte abnormalities were previously addressed per investigator. Eligibility reviewed and confirmed by Dr. Julien Nordmann, noting that patient has no medical conditions that would make this protocol unreasonably hazardous for the patient. Patient meets all eligibility criteria, will proceed with study enrollment via [place]. Cindy S. Brigitte Pulse BSN, RN, [facility] 12/16/2019 8:54 AM   Patient randomized to Arm C with notifications sent to pharmacy, financial and treating investigator. Cindy S. Brigitte Pulse BSN, RN, [place] 12/16/2019 9:10 AM    Contacted patient by phone to notify him of treatment assignment. He is pleased with the randomization results. Completed the [id number] "REGISTRATION FATIGUE/UNISCALE ASSESSMENTS" as required by protocol.  Confirmed availability of investigational product with pharmacist Carolynne Edouard. Cindy S. Brigitte Pulse BSN, RN, CCRP 12/16/2019 10:45 AM

## 2019-12-19 ENCOUNTER — Other Ambulatory Visit: Payer: Self-pay

## 2019-12-19 ENCOUNTER — Ambulatory Visit: Payer: Self-pay | Admitting: *Deleted

## 2019-12-19 ENCOUNTER — Inpatient Hospital Stay: Payer: Medicare Other

## 2019-12-19 ENCOUNTER — Encounter: Payer: Self-pay | Admitting: *Deleted

## 2019-12-19 ENCOUNTER — Encounter: Payer: Self-pay | Admitting: Internal Medicine

## 2019-12-19 ENCOUNTER — Inpatient Hospital Stay: Payer: Medicare Other | Admitting: Internal Medicine

## 2019-12-19 VITALS — BP 111/81 | HR 96 | Temp 97.5°F | Resp 18 | Ht 70.0 in | Wt 168.1 lb

## 2019-12-19 DIAGNOSIS — Z006 Encounter for examination for normal comparison and control in clinical research program: Secondary | ICD-10-CM | POA: Diagnosis not present

## 2019-12-19 DIAGNOSIS — C3412 Malignant neoplasm of upper lobe, left bronchus or lung: Secondary | ICD-10-CM

## 2019-12-19 DIAGNOSIS — C771 Secondary and unspecified malignant neoplasm of intrathoracic lymph nodes: Secondary | ICD-10-CM | POA: Diagnosis not present

## 2019-12-19 DIAGNOSIS — C3492 Malignant neoplasm of unspecified part of left bronchus or lung: Secondary | ICD-10-CM

## 2019-12-19 DIAGNOSIS — Z5111 Encounter for antineoplastic chemotherapy: Secondary | ICD-10-CM

## 2019-12-19 DIAGNOSIS — I10 Essential (primary) hypertension: Secondary | ICD-10-CM

## 2019-12-19 DIAGNOSIS — Z7189 Other specified counseling: Secondary | ICD-10-CM

## 2019-12-19 DIAGNOSIS — C3410 Malignant neoplasm of upper lobe, unspecified bronchus or lung: Secondary | ICD-10-CM

## 2019-12-19 DIAGNOSIS — Z5112 Encounter for antineoplastic immunotherapy: Secondary | ICD-10-CM

## 2019-12-19 LAB — CMP (CANCER CENTER ONLY)
ALT: 20 U/L (ref 0–44)
AST: 14 U/L — ABNORMAL LOW (ref 15–41)
Albumin: 4 g/dL (ref 3.5–5.0)
Alkaline Phosphatase: 89 U/L (ref 38–126)
Anion gap: 14 (ref 5–15)
BUN: 7 mg/dL — ABNORMAL LOW (ref 8–23)
CO2: 26 mmol/L (ref 22–32)
Calcium: 10.5 mg/dL — ABNORMAL HIGH (ref 8.9–10.3)
Chloride: 100 mmol/L (ref 98–111)
Creatinine: 0.87 mg/dL (ref 0.61–1.24)
GFR, Est AFR Am: >60 mL/min (ref >60)
GFR, Estimated: >60 mL/min (ref >60)
Glucose, Bld: 104 mg/dL — ABNORMAL HIGH (ref 70–99)
Potassium: 4.1 mmol/L (ref 3.5–5.1)
Sodium: 140 mmol/L (ref 135–145)
Total Bilirubin: 0.6 mg/dL (ref 0.3–1.2)
Total Protein: 6.5 g/dL (ref 6.5–8.1)

## 2019-12-19 LAB — CBC WITH DIFFERENTIAL (CANCER CENTER ONLY)
Abs Immature Granulocytes: 0.02 10*3/uL (ref 0.00–0.07)
Basophils Absolute: 0.1 10*3/uL (ref 0.0–0.1)
Basophils Relative: 1 %
Eosinophils Absolute: 0.3 10*3/uL (ref 0.0–0.5)
Eosinophils Relative: 4 %
HCT: 47.4 % (ref 39.0–52.0)
Hemoglobin: 16.1 g/dL (ref 13.0–17.0)
Immature Granulocytes: 0 %
Lymphocytes Relative: 24 %
Lymphs Abs: 1.9 10*3/uL (ref 0.7–4.0)
MCH: 31.4 pg (ref 26.0–34.0)
MCHC: 34 g/dL (ref 30.0–36.0)
MCV: 92.6 fL (ref 80.0–100.0)
Monocytes Absolute: 0.8 10*3/uL (ref 0.1–1.0)
Monocytes Relative: 10 %
Neutro Abs: 5 10*3/uL (ref 1.7–7.7)
Neutrophils Relative %: 61 %
Platelet Count: 227 10*3/uL (ref 150–400)
RBC: 5.12 MIL/uL (ref 4.22–5.81)
RDW: 13.2 % (ref 11.5–15.5)
WBC Count: 8.1 10*3/uL (ref 4.0–10.5)
nRBC: 0 % (ref 0.0–0.2)

## 2019-12-19 LAB — RESEARCH LABS

## 2019-12-19 LAB — MAGNESIUM: Magnesium: 1.5 mg/dL — ABNORMAL LOW (ref 1.7–2.4)

## 2019-12-19 MED ORDER — SODIUM CHLORIDE 0.9 % IV SOLN
Freq: Once | INTRAVENOUS | Status: AC
  Filled 2019-12-19: qty 250

## 2019-12-19 MED ORDER — PALONOSETRON HCL INJECTION 0.25 MG/5ML
0.2500 mg | Freq: Once | INTRAVENOUS | Status: AC
  Administered 2019-12-19: 0.25 mg via INTRAVENOUS

## 2019-12-19 MED ORDER — SODIUM CHLORIDE 0.9 % IV SOLN
75.0000 mg/m2 | Freq: Once | INTRAVENOUS | Status: AC
  Administered 2019-12-19: 146 mg via INTRAVENOUS
  Filled 2019-12-19: qty 146

## 2019-12-19 MED ORDER — SODIUM CHLORIDE 0.9 % IV SOLN
Freq: Once | INTRAVENOUS | Status: AC
  Filled 2019-12-19: qty 10

## 2019-12-19 MED ORDER — SODIUM CHLORIDE 0.9 % IV SOLN
150.0000 mg | Freq: Once | INTRAVENOUS | Status: AC
  Administered 2019-12-19: 150 mg via INTRAVENOUS
  Filled 2019-12-19: qty 150

## 2019-12-19 MED ORDER — PALONOSETRON HCL INJECTION 0.25 MG/5ML
INTRAVENOUS | Status: AC
  Filled 2019-12-19: qty 5

## 2019-12-19 MED ORDER — SODIUM CHLORIDE 0.9 % IV SOLN
10.0000 mg | Freq: Once | INTRAVENOUS | Status: AC
  Administered 2019-12-19: 10 mg via INTRAVENOUS
  Filled 2019-12-19: qty 10

## 2019-12-19 MED ORDER — SODIUM CHLORIDE 0.9 % IV SOLN
400.0000 mg | Freq: Once | INTRAVENOUS | Status: AC
  Administered 2019-12-19: 400 mg via INTRAVENOUS
  Filled 2019-12-19: qty 16

## 2019-12-19 MED ORDER — SODIUM CHLORIDE 0.9 % IV SOLN
500.0000 mg/m2 | Freq: Once | INTRAVENOUS | Status: AC
  Administered 2019-12-19: 1000 mg via INTRAVENOUS
  Filled 2019-12-19: qty 40

## 2019-12-19 NOTE — Progress Notes (Signed)
[place] [phone]   Fax:([phone]) [place], MD [address] [place] [address]  DIAGNOSIS: stageIIIa(T2a, N2,M0)non-small cell lung cancer, adenocarcinoma. Patient presented with a left upper lobe nodule.He was diagnosed in March 2021  Biomarkers/PDL1:  Detected EGFR mutation in exon 21 ? Positive for the EGFR P848L mutation in exon 21. No mutation was detected in exons 12, 18, 19, and 20. The clinical significance of P848L in exon 21 is uncertain. This variant has been reported to behave like functionally silent polymorphism. [name], M.M., et al. Mol Cancer 6, 56 (2007). ? PD-L1 Expression Level: TPS < 1% PD-L1 Expression Status: No Expression NEGATIVE FOR ALK REARRANGEMENT  PRIOR THERAPY: robotic assisted wedge resection of the left upper lobe under the care of Dr. Servando Snare. This was performed on 10/28/2019.  CURRENT THERAPY: Adjuvant systemic chemotherapy on the [facility] A 081801 protocol with cisplatin 70 mg/M2, pemetrexed 500 mg/M2 every 3 weeks in addition to pembrolizumab 400 mg IV every 6 weeks. Start date December 19, 2019..   INTERVAL HISTORY: Chad Gomez 74 y.o. male returns to the clinic today for follow-up visit accompanied by his wife.  The patient is feeling fine today with no concerning complaints except for persistent mild left-sided chest pain.  He denied having any shortness of breath, cough or hemoptysis.  He denied having any current nausea, vomiting but has occasional diarrhea with no constipation.  He denied having any headache or visual changes.  He lost few pounds since his last visit.  The patient is here today for evaluation before starting the first cycle of systemic chemotherapy according to the [facility] A 081801 protocol and the patient is randomized to the treatment with cisplatin, pemetrexed as well as pembrolizumab.  MEDICAL HISTORY: Past Medical  History:  Diagnosis Date  . BPH (benign prostatic hypertrophy)    weak stream  . Chronic kidney disease   . Complication of anesthesia    urinary retention  . COPD (chronic obstructive pulmonary disease) ([place])   . Diabetes mellitus without complication ([place])    Type II  . History of bladder stone   . History of colon polyps    08/2013   pre-canerous  . History of kidney stones   . History of melanoma excision    LEFT EAR  . Hyperlipidemia   . Hypertension   . Iron deficiency   . Lung cancer, lingula ([place]) 11/01/2019  . Open wound    left lower jaw; followed by ENT  . Peripheral vascular disease ([place])   . Right ureteral stone   . Shingles 05-15-2015    ALLERGIES:  is allergic to morphine and related, tape, amoxicillin, and naproxen.  MEDICATIONS:  Current Outpatient Medications  Medication Sig Dispense Refill  . acetaminophen (TYLENOL) 500 MG tablet Take 2 tablets (1,000 mg total) by mouth every 6 (six) hours as needed for mild pain. 30 tablet 0  . albuterol (VENTOLIN HFA) 108 (90 Base) MCG/ACT inhaler Inhale 2 puffs into the lungs every 6 (six) hours as needed for wheezing or shortness of breath. 8 g 3  . amLODipine (NORVASC) 10 MG tablet Take 10 mg by mouth daily.    Marland Kitchen aspirin EC 81 MG tablet Take 81 mg by mouth daily.    Marland Kitchen atorvastatin (LIPITOR) 40 MG tablet Take 40 mg by mouth daily.    Marland Kitchen buPROPion (WELLBUTRIN XL) 150 MG 24 hr tablet Take 150 mg by mouth  every morning.    . cetirizine (ZYRTEC) 10 MG tablet Take 10 mg by mouth daily.    . Cholecalciferol (VITAMIN D-3) 1000 units CAPS Take 1,000 Units by mouth daily.     Marland Kitchen dextromethorphan-guaiFENesin (MUCINEX DM) 30-600 MG 12hr tablet Take 1 tablet by mouth 2 (two) times daily as needed for cough.    . ezetimibe (ZETIA) 10 MG tablet Take 5 mg by mouth daily.    . finasteride (PROSCAR) 5 MG tablet Take 5 mg by mouth daily.    . fluticasone (FLONASE) 50 MCG/ACT nasal spray Place 2 sprays into both nostrils daily.     .  folic acid (FOLVITE) 1 MG tablet Take 1 tablet (1 mg total) by mouth daily. 30 tablet 2  . indapamide (LOZOL) 1.25 MG tablet Take 1.25 mg by mouth daily.    Marland Kitchen LORazepam (ATIVAN) 0.5 MG tablet Take 1 tablet (0.5 mg total) by mouth every 8 (eight) hours. 2 tablet 0  . Magnesium 250 MG TABS Take 250 mg by mouth daily.     . magnesium oxide (MAG-OX) 400 (241.3 Mg) MG tablet Take 1 tablet (400 mg total) by mouth daily. 30 tablet 1  . metFORMIN (GLUCOPHAGE-XR) 500 MG 24 hr tablet Take 500-1,000 mg by mouth See admin instructions. Take 500 mg every morning and take 1000 mg at supper    . potassium citrate (UROCIT-K) 10 MEQ (1080 MG) SR tablet Take 10 mEq by mouth in the morning and at bedtime.     Marland Kitchen PROAIR HFA 108 (90 Base) MCG/ACT inhaler INHALE 2 PUFFS BY MOUTH EVERY 6 HOURS AS NEEDED FOR WHEEZING AND FOR SHORTNESS OF BREATH 9 g 0  . prochlorperazine (COMPAZINE) 10 MG tablet Take 1 tablet (10 mg total) by mouth every 6 (six) hours as needed. 30 tablet 2  . tamsulosin (FLOMAX) 0.4 MG CAPS capsule Take 0.4 mg by mouth in the morning and at bedtime.    . Tiotropium Bromide-Olodaterol (STIOLTO RESPIMAT) 2.5-2.5 MCG/ACT AERS Inhale 2 puffs into the lungs daily. 1 Inhaler 11  . traMADol (ULTRAM) 50 MG tablet Take 1 tablet (50 mg total) by mouth every 6 (six) hours as needed (mild pain). 30 tablet 0   No current facility-administered medications for this visit.    SURGICAL HISTORY:  Past Surgical History:  Procedure Laterality Date  . ABDOMINAL AORTAGRAM  11/01/2014   Procedure: Abdominal Aortagram;  Surgeon: Serafina Mitchell, MD;  Location: [place] [facility];  Service: Cardiovascular;;  . AORTA - BILATERAL FEMORAL ARTERY BYPASS GRAFT N/A 12/28/2014   Procedure: AORTOBIFEMORAL BYPASS GRAFT;  Surgeon: Serafina Mitchell, MD;  Location: [place];  Service: Vascular;  Laterality: N/A;  . BRONCHIAL BRUSHINGS  09/06/2019   Procedure: BRONCHIAL BRUSHINGS;  Surgeon: Collene Gobble, MD;  Location: [facility] ENDOSCOPY;  Service:  Pulmonary;;  left upper lobe nodule  . BRONCHIAL WASHINGS  09/06/2019   Procedure: BRONCHIAL WASHINGS;  Surgeon: Collene Gobble, MD;  Location: [facility] ENDOSCOPY;  Service: Pulmonary;;  . COLONOSCOPY W/ POLYPECTOMY  09-13-2013  . CYSTOLITHALOPAXY OF BLADDER STONE/ TRANSRECTAL ULTRASOUND PROSTATE BX  08-19-2005  . CYSTOSCOPY W/ RETROGRADES Right 12/02/2012   Procedure: CYSTOSCOPY WITH RETROGRADE PYELOGRAM;  Surgeon: Malka So, MD;  Location: [facility];  Service: Urology;  Laterality: Right;  . CYSTOSCOPY W/ RETROGRADES Bilateral 10/13/2013   Procedure: CYSTOSCOPY WITH RETROGRADE PYELOGRAM;  Surgeon: Irine Seal, MD;  Location: [facility];  Service: Urology;  Laterality: Bilateral;  . CYSTOSCOPY W/ URETERAL STENT  PLACEMENT Left 04/11/2014   Procedure: CYSTOSCOPY WITH STENT REPLACEMENT;  Surgeon: Malka So, MD;  Location: [facility];  Service: Urology;  Laterality: Left;  . CYSTOSCOPY WITH LITHOLAPAXY Right 12/02/2012   Procedure: CYSTOSCOPY WITH stone extraction;  Surgeon: Malka So, MD;  Location: [facility];  Service: Urology;  Laterality: Right;  . CYSTOSCOPY WITH LITHOLAPAXY Right 10/13/2013   Procedure: CYSTOSCOPY WITH LITHOLAPAXY, removal of urethral stone with basket;  Surgeon: Irine Seal, MD;  Location: [facility];  Service: Urology;  Laterality: Right;  . CYSTOSCOPY WITH RETROGRADE PYELOGRAM, URETEROSCOPY AND STENT PLACEMENT Left 03/21/2014   Procedure: CYSTOSCOPY WITH RETROGRADE PYELOGRAM, AND LEFT STENT PLACEMENT;  Surgeon: Alexis Frock, MD;  Location: [facility];  Service: Urology;  Laterality: Left;  . CYSTOSCOPY WITH RETROGRADE PYELOGRAM, URETEROSCOPY AND STENT PLACEMENT  09/09/2016   Procedure: CYSTOSCOPY WITH RIGHT  RETROGRADE PYELOGRAM, URETEROSCOPY WITH LASER BASKET EXTRACTION OF STONES  AND STENT PLACEMENT;  Surgeon: Irine Seal, MD;  Location[facility];  Service: Urology;;  . Consuela Mimes  WITH URETEROSCOPY Left 04/11/2014   Procedure: CYSTOSCOPY WITH URETEROSCOPY/ STONE EXTRACTION;  Surgeon: Malka So, MD;  Location: [facility];  Service: Urology;  Laterality: Left;  . FINE NEEDLE ASPIRATION  09/06/2019   Procedure: FINE NEEDLE ASPIRATION;  Surgeon: Collene Gobble, MD;  Location: [facility] ENDOSCOPY;  Service: Pulmonary;;  left upper lobe - lung  . HOLMIUM LASER APPLICATION Right 0/02/2329   Procedure: HOLMIUM LASER APPLICATION;  Surgeon: Malka So, MD;  Location: [facility];  Service: Urology;  Laterality: Right;  . HOLMIUM LASER APPLICATION Left 07/62/2633   Procedure: HOLMIUM LASER APPLICATION;  Surgeon: Malka So, MD;  Location: [facility];  Service: Urology;  Laterality: Left;  . HOLMIUM LASER APPLICATION Right 3/54/5625   Procedure: HOLMIUM LASER APPLICATION;  Surgeon: Irine Seal, MD;  Location: [facility];  Service: Urology;  Laterality: Right;  . INGUINAL HERNIA REPAIR Right 1954  . INTERCOSTAL NERVE BLOCK Left 10/28/2019   Procedure: Intercostal Nerve Block;  Surgeon: [name], MD;  Location: [place];  Service: Thoracic;  Laterality: Left;  . LITHOTRIPSY  2007 & 02/2013  . LUNG BIOPSY  09/06/2019   Procedure: LUNG BIOPSY;  Surgeon: Collene Gobble, MD;  Location: [facility] ENDOSCOPY;  Service: Pulmonary;;  left upper lob lung  . [place]  . MICROLARYNGOSCOPY Right 02/18/2016   Procedure: MICRO DIRECT LARYNGOSCOPY EXCISIONAL BIOPSY OF RIGHT VOCAL CORD MASS;  Surgeon: Leta Baptist, MD;  Location: [place];  Service: ENT;  Laterality: Right;  . [place]   left ear  . MULTIPLE TOOTH EXTRACTIONS  1994  . NEPHROLITHOTOMY Left 01/27/2013   Procedure: NEPHROLITHOTOMY PERCUTANEOUS FIRST LOOK;  Surgeon: Malka So, MD;  Location: [facility];  Service: Urology;  Laterality: Left;  . NODE DISSECTION  10/28/2019   Procedure: Node Dissection;  Surgeon: [name], MD;   Location: [facility] OR;  Service: Thoracic;;  . PERIPHERAL VASCULAR CATHETERIZATION N/A 11/01/2014   Procedure: Lower Extremity Angiography;  Surgeon: Serafina Mitchell, MD;  Location: [place] [facility];  Service: Cardiovascular;  Laterality: N/A;  . URETEROSCOPY Right 12/02/2012   Procedure: RIGHT URETEROSCOPY STONE EXTRACTION WITH  STENT PLACEMENT;  Surgeon: Malka So, MD;  Location: [facility];  Service: Urology;  Laterality: Right;  Marland Kitchen VIDEO BRONCHOSCOPY  10/28/2019   VIDEO BRONCHOSCOPY WITH ENDOBRONCHIAL NAVIGATION  WITH LUNG MARKINGS (N/A  . VIDEO BRONCHOSCOPY WITH ENDOBRONCHIAL NAVIGATION N/A 09/06/2019   Procedure: VIDEO BRONCHOSCOPY WITH ENDOBRONCHIAL NAVIGATION;  Surgeon: Collene Gobble, MD;  Location: [place] ENDOSCOPY;  Service: Pulmonary;  Laterality: N/A;  . VIDEO BRONCHOSCOPY WITH ENDOBRONCHIAL NAVIGATION N/A 10/28/2019   Procedure: VIDEO BRONCHOSCOPY WITH ENDOBRONCHIAL NAVIGATION WITH LUNG MARKINGS;  Surgeon: [name], MD;  Location: [place];  Service: Thoracic;  Laterality: N/A;  . WEDGE RESECTION  10/28/2019   LEFT UPPER LOBE    REVIEW OF SYSTEMS:  Constitutional: positive for fatigue Eyes: negative Ears, nose, mouth, throat, and face: negative Respiratory: positive for pleurisy/chest pain Cardiovascular: negative Gastrointestinal: negative Genitourinary:negative Integument/breast: negative Hematologic/lymphatic: negative Musculoskeletal:negative Neurological: negative Behavioral/Psych: negative Endocrine: negative Allergic/Immunologic: negative   PHYSICAL EXAMINATION: General appearance: alert, cooperative and no distress Head: Normocephalic, without obvious abnormality, atraumatic Neck: no adenopathy, no JVD, supple, symmetrical, trachea midline and thyroid not enlarged, symmetric, no tenderness/mass/nodules Lymph nodes: Cervical, supraclavicular, and axillary nodes normal. Resp: clear to auscultation bilaterally Back: symmetric, no curvature. ROM  normal. No CVA tenderness. Cardio: regular rate and rhythm, S1, S2 normal, no murmur, click, rub or gallop GI: soft, non-tender; bowel sounds normal; no masses,  no organomegaly Extremities: extremities normal, atraumatic, no cyanosis or edema Neurologic: Alert and oriented X 3, normal strength and tone. Normal symmetric reflexes. Normal coordination and gait  ECOG PERFORMANCE STATUS: 1 - Symptomatic but completely ambulatory  Blood pressure 111/81, pulse 96, temperature (!) 97.5 F (36.4 C), temperature source Temporal, resp. rate 18, height _0  (1.778 m), weight 168 lb 1.6 oz (76.2 kg), SpO2 98 %.  LABORATORY DATA: Lab Results  Component Value Date   WBC 8.1 12/19/2019   HGB 16.1 12/19/2019   HCT 47.4 12/19/2019   MCV 92.6 12/19/2019   PLT 227 12/19/2019      Chemistry      Component Value Date/Time   NA 138 12/15/2019 0814   K 3.8 12/15/2019 0814   CL 97 (L) 12/15/2019 0814   CO2 32 12/15/2019 0814   BUN 9 12/15/2019 0814   CREATININE 0.99 12/15/2019 0814      Component Value Date/Time   CALCIUM 11.4 (H) 12/15/2019 0814   ALKPHOS 87 12/15/2019 0814   AST 12 (L) 12/15/2019 0814   ALT 15 12/15/2019 0814   BILITOT 0.7 12/15/2019 0814       RADIOGRAPHIC STUDIES: DG Chest 2 View  Result Date: 12/08/2019 CLINICAL DATA:  Lung cancer status post left upper lobe wedge resection on 10/28/2019. No current complaints. EXAM: CHEST - 2 VIEW COMPARISON:  11/17/2019 FINDINGS: The cardiomediastinal silhouette is unchanged with normal heart size. Aortic atherosclerosis is noted. The lungs are hyperinflated. Postsurgical changes are noted in the anterior left mid lung. No acute airspace consolidation, edema, pleural effusion, pneumothorax is identified. Soft tissue emphysema in the left chest wall has nearly completely resolved. No acute osseous abnormality is seen. IMPRESSION: Postsurgical changes with near complete resolution of subcutaneous emphysema. No evidence of acute  cardiopulmonary process. Electronically Signed   By: Logan Bores M.D.   On: 12/08/2019 09:12   MR Brain W Wo Contrast  Result Date: 12/16/2019 CLINICAL DATA:  Lung cancer staging. EXAM: MRI HEAD WITHOUT AND WITH CONTRAST TECHNIQUE: Multiplanar, multiecho pulse sequences of the brain and surrounding structures were obtained without and with intravenous contrast. CONTRAST:  60m MULTIHANCE GADOBENATE DIMEGLUMINE 529 MG/ML IV SOLN COMPARISON:  None. FINDINGS: Brain: No acute infarct, mass, midline shift, or extra-axial fluid collection is identified. T2 hyperintensities  in the cerebral white matter and pons are nonspecific but compatible with mild-to-moderate chronic small vessel ischemic disease. Chronic lacunar infarcts are noted in the right corona radiata, basal ganglia, and left thalamus. There is a chronic microhemorrhage in the left cerebellar hemisphere. No abnormal enhancement is identified. There is mild cerebral atrophy. Vascular: Major intracranial vascular flow voids are preserved. Skull and upper cervical spine: Unremarkable bone marrow signal. Sinuses/Orbits: Unremarkable orbits. Moderate right sphenoid and mild bilateral maxillary sinus mucosal thickening. Small volume secretions in the left maxillary sinus. Clear mastoid air cells. Other: None. IMPRESSION: 1. No evidence of intracranial metastases. 2. Mild-to-moderate chronic small vessel ischemic disease. Electronically Signed   By: Logan Bores M.D.   On: 12/16/2019 08:47    ASSESSMENT AND PLAN: This is a very pleasant 74 years old white male recently diagnosed with stage IIIa (T2 a, N2, M0) non-small cell lung cancer, adenocarcinoma presented with left upper lobe lung nodule and after resection the patient was found to have metastatic disease to the mediastinal lymphadenopathy.  This was diagnosed in March 2021.  The patient is here today for evaluation before starting the first cycle of his systemic chemotherapy with cisplatin, pemetrexed  and pembrolizumab according to the alliance protocol. The patient is feeling much better. I recommended for him to proceed with the treatment today as planned.  I discussed with him the adverse effect of this treatment and he was also seen by the research nurse today. He has some tolerability issues to the folic acid and he was advised to take multivitamin containing folic acid. The patient will come back for follow-up visit in 3 weeks for evaluation before starting cycle #2 of his treatment. He was advised to call immediately if he has any concerning symptoms in the interval. The patient voices understanding of current disease status and treatment options and is in agreement with the current care plan.  All questions were answered. The patient knows to call the clinic with any problems, questions or concerns. We can certainly see the patient much sooner if necessary.  The total time spent in the appointment was 30 minutes.  Disclaimer: This note was dictated with voice recognition software. Similar sounding words can inadvertently be transcribed and may not be corrected upon review.

## 2019-12-19 NOTE — Research (Signed)
12/19/2019  Research - [facility] [id number] research study - Cycle 1, Day 1 visit Patient into clinic today accompanied by his wife for Cycle 1, Day 1 assessments and treatments.   Routine and research labs - C1D1 CBC and CMET obtained as well as standard of care magnesium level. Pre-treatment research blood samples were collected by venipuncture.   Baseline Patient Questionnaire booklet - Completed by patient independently upon arrival to clinic.    Solicited AEs - Assessment conducted by research nurse today for baseline assessments, see table below. In regard to number of stools per day at baseline, patient reports that this can vary widely. Some days he has up to 8-10 stools, with no particular cause noted, then he may go several days with no bowel movements. He denies any diarrhea at present. Patient reports ongoing urinary tract symptoms currently managed by Dr. Jeffie Pollock. He states that he again had a catheter placed last week and currently has a leg bag in place (grade 2 urinary retention).   H&P and Performance status - See MD progress note. Based on lab results review from today, patient condition is acceptable for treatment.  . Folic acid supplementation - patient was instructed to try an OTC brand of folic acid or a multivitamin, due to reports of GI upset with prescribed folic acid. Marland Kitchen Hypomagnesemia - patient states he received his magnesium prescription but has not yet started it and will start it today.   Infusion - Sign for infusion given to Criselda Peaches RN along with patient status update.    Adverse Event Log (Baseline AEs) Study/Protocol: [facility] [id number] Baseline (Pre-treatment): 5/88/5027 Solicited &/or Reportable Events Grade Comments  Anemia Grade 0    Hypothyroidism Grade 0    Pneumonitis  Grade 0    Neutrophil count decreased Grade 0    Platelet count decreased Grade 0    Rash maculo-papular Grade 0    Hyperglycemia Grade 0  Non-fasting sample obtained.  ALT increased  Grade 0    AST increased Grade 0    Blood bilirubin increased Grade 0    Diarrhea Grade 0 See note above regarding number of stools per/day   Urinary retention Grade 2   Hypomagnesemia Grade 1   Hypercalcemia Grade 1    Cindy S. Brigitte Pulse BSN, RN, [place] 12/19/2019 11:50 AM

## 2019-12-19 NOTE — Patient Instructions (Signed)
[facility] Discharge Instructions for Patients Receiving Chemotherapy  Today you received the following chemotherapy agents: pembrolizumab, pemetrexid, cisplatin.   To help prevent nausea and vomiting after your treatment, we encourage you to take your nausea medication as prescribed by your physician.    If you develop nausea and vomiting that is not controlled by your nausea medication, call the clinic.   BELOW ARE SYMPTOMS THAT SHOULD BE REPORTED IMMEDIATELY:  *FEVER GREATER THAN 100.5 F  *CHILLS WITH OR WITHOUT FEVER  NAUSEA AND VOMITING THAT IS NOT CONTROLLED WITH YOUR NAUSEA MEDICATION  *UNUSUAL SHORTNESS OF BREATH  *UNUSUAL BRUISING OR BLEEDING  TENDERNESS IN MOUTH AND THROAT WITH OR WITHOUT PRESENCE OF ULCERS  *URINARY PROBLEMS  *BOWEL PROBLEMS  UNUSUAL RASH Items with * indicate a potential emergency and should be followed up as soon as possible.  Feel free to call the clinic should you have any questions or concerns. The clinic phone number is [phone].  Please show the [facility] at check-in to the Emergency Department and triage nurse.  Pemetrexed injection What is this medicine? PEMETREXED (PEM e TREX ed) is a chemotherapy drug used to treat lung cancers like non-small cell lung cancer and mesothelioma. It may also be used to treat other cancers. This medicine may be used for other purposes; ask your health care provider or pharmacist if you have questions. COMMON BRAND NAME(S): Alimta What should I tell my health care provider before I take this medicine? They need to know if you have any of these conditions:  infection (especially a virus infection such as chickenpox, cold sores, or herpes)  kidney disease  low blood counts, like low white cell, platelet, or red cell counts  lung or breathing disease, like asthma  radiation therapy  an unusual or allergic reaction to pemetrexed, other medicines, foods, dyes, or  preservative  pregnant or trying to get pregnant  breast-feeding How should I use this medicine? This drug is given as an infusion into a vein. It is administered in a hospital or clinic by a specially trained health care professional. Talk to your pediatrician regarding the use of this medicine in children. Special care may be needed. Overdosage: If you think you have taken too much of this medicine contact a poison control center or emergency room at once. NOTE: This medicine is only for you. Do not share this medicine with others. What if I miss a dose? It is important not to miss your dose. Call your doctor or health care professional if you are unable to keep an appointment. What may interact with this medicine? This medicine may interact with the following medications:  Ibuprofen This list may not describe all possible interactions. Give your health care provider a list of all the medicines, herbs, non-prescription drugs, or dietary supplements you use. Also tell them if you smoke, drink alcohol, or use illegal drugs. Some items may interact with your medicine. What should I watch for while using this medicine? Visit your doctor for checks on your progress. This drug may make you feel generally unwell. This is not uncommon, as chemotherapy can affect healthy cells as well as cancer cells. Report any side effects. Continue your course of treatment even though you feel ill unless your doctor tells you to stop. In some cases, you may be given additional medicines to help with side effects. Follow all directions for their use. Call your doctor or health care professional for advice if you get a fever,  chills or sore throat, or other symptoms of a cold or flu. Do not treat yourself. This drug decreases your body's ability to fight infections. Try to avoid being around people who are sick. This medicine may increase your risk to bruise or bleed. Call your doctor or health care professional if  you notice any unusual bleeding. Be careful brushing and flossing your teeth or using a toothpick because you may get an infection or bleed more easily. If you have any dental work done, tell your dentist you are receiving this medicine. Avoid taking products that contain aspirin, acetaminophen, ibuprofen, naproxen, or ketoprofen unless instructed by your doctor. These medicines may hide a fever. Call your doctor or health care professional if you get diarrhea or mouth sores. Do not treat yourself. To protect your kidneys, drink water or other fluids as directed while you are taking this medicine. Do not become pregnant while taking this medicine or for 6 months after stopping it. Women should inform their doctor if they wish to become pregnant or think they might be pregnant. Men should not father a child while taking this medicine and for 3 months after stopping it. This may interfere with the ability to father a child. You should talk to your doctor or health care professional if you are concerned about your fertility. There is a potential for serious side effects to an unborn child. Talk to your health care professional or pharmacist for more information. Do not breast-feed an infant while taking this medicine or for 1 week after stopping it. What side effects may I notice from receiving this medicine? Side effects that you should report to your doctor or health care professional as soon as possible:  allergic reactions like skin rash, itching or hives, swelling of the face, lips, or tongue  breathing problems  redness, blistering, peeling or loosening of the skin, including inside the mouth  signs and symptoms of bleeding such as bloody or black, tarry stools; red or dark-brown urine; spitting up blood or brown material that looks like coffee grounds; red spots on the skin; unusual bruising or bleeding from the eye, gums, or nose  signs and symptoms of infection like fever or chills; cough;  sore throat; pain or trouble passing urine  signs and symptoms of kidney injury like trouble passing urine or change in the amount of urine  signs and symptoms of liver injury like dark yellow or brown urine; general ill feeling or flu-like symptoms; light-colored stools; loss of appetite; nausea; right upper belly pain; unusually weak or tired; yellowing of the eyes or skin Side effects that usually do not require medical attention (report to your doctor or health care professional if they continue or are bothersome):  constipation  mouth sores  nausea, vomiting  unusually weak or tired This list may not describe all possible side effects. Call your doctor for medical advice about side effects. You may report side effects to FDA at 1-800-FDA-1088. Where should I keep my medicine? This drug is given in a hospital or clinic and will not be stored at home. NOTE: This sheet is a summary. It may not cover all possible information. If you have questions about this medicine, talk to your doctor, pharmacist, or health care provider.  2020 Elsevier/Gold Standard (2017-07-29 16:11:33)  Pembrolizumab injection What is this medicine? PEMBROLIZUMAB (pem broe liz ue mab) is a monoclonal antibody. It is used to treat certain types of cancer. This medicine may be used for other purposes; ask your  health care provider or pharmacist if you have questions. COMMON BRAND NAME(S): Keytruda What should I tell my health care provider before I take this medicine? They need to know if you have any of these conditions:  diabetes  immune system problems  inflammatory bowel disease  liver disease  lung or breathing disease  lupus  received or scheduled to receive an organ transplant or a stem-cell transplant that uses donor stem cells  an unusual or allergic reaction to pembrolizumab, other medicines, foods, dyes, or preservatives  pregnant or trying to get pregnant  breast-feeding How should I  use this medicine? This medicine is for infusion into a vein. It is given by a health care professional in a hospital or clinic setting. A special MedGuide will be given to you before each treatment. Be sure to read this information carefully each time. Talk to your pediatrician regarding the use of this medicine in children. While this drug may be prescribed for children as young as 6 months for selected conditions, precautions do apply. Overdosage: If you think you have taken too much of this medicine contact a poison control center or emergency room at once. NOTE: This medicine is only for you. Do not share this medicine with others. What if I miss a dose? It is important not to miss your dose. Call your doctor or health care professional if you are unable to keep an appointment. What may interact with this medicine? Interactions have not been studied. Give your health care provider a list of all the medicines, herbs, non-prescription drugs, or dietary supplements you use. Also tell them if you smoke, drink alcohol, or use illegal drugs. Some items may interact with your medicine. This list may not describe all possible interactions. Give your health care provider a list of all the medicines, herbs, non-prescription drugs, or dietary supplements you use. Also tell them if you smoke, drink alcohol, or use illegal drugs. Some items may interact with your medicine. What should I watch for while using this medicine? Your condition will be monitored carefully while you are receiving this medicine. You may need blood work done while you are taking this medicine. Do not become pregnant while taking this medicine or for 4 months after stopping it. Women should inform their doctor if they wish to become pregnant or think they might be pregnant. There is a potential for serious side effects to an unborn child. Talk to your health care professional or pharmacist for more information. Do not breast-feed an  infant while taking this medicine or for 4 months after the last dose. What side effects may I notice from receiving this medicine? Side effects that you should report to your doctor or health care professional as soon as possible:  allergic reactions like skin rash, itching or hives, swelling of the face, lips, or tongue  bloody or black, tarry  breathing problems  changes in vision  chest pain  chills  confusion  constipation  cough  diarrhea  dizziness or feeling faint or lightheaded  fast or irregular heartbeat  fever  flushing  joint pain  low blood counts - this medicine may decrease the number of white blood cells, red blood cells and platelets. You may be at increased risk for infections and bleeding.  muscle pain  muscle weakness  pain, tingling, numbness in the hands or feet  persistent headache  redness, blistering, peeling or loosening of the skin, including inside the mouth  signs and symptoms of high blood sugar  such as dizziness; dry mouth; dry skin; fruity breath; nausea; stomach pain; increased hunger or thirst; increased urination  signs and symptoms of kidney injury like trouble passing urine or change in the amount of urine  signs and symptoms of liver injury like dark urine, light-colored stools, loss of appetite, nausea, right upper belly pain, yellowing of the eyes or skin  sweating  swollen lymph nodes  weight loss Side effects that usually do not require medical attention (report to your doctor or health care professional if they continue or are bothersome):  decreased appetite  hair loss  muscle pain  tiredness This list may not describe all possible side effects. Call your doctor for medical advice about side effects. You may report side effects to FDA at 1-800-FDA-1088. Where should I keep my medicine? This drug is given in a hospital or clinic and will not be stored at home. NOTE: This sheet is a summary. It may not  cover all possible information. If you have questions about this medicine, talk to your doctor, pharmacist, or health care provider.  2020 Elsevier/Gold Standard (2019-04-15 18:07:58)  Cisplatin injection What is this medicine? CISPLATIN (SIS pla tin) is a chemotherapy drug. It targets fast dividing cells, like cancer cells, and causes these cells to die. This medicine is used to treat many types of cancer like bladder, ovarian, and testicular cancers. This medicine may be used for other purposes; ask your health care provider or pharmacist if you have questions. COMMON BRAND NAME(S): Platinol, Platinol -AQ What should I tell my health care provider before I take this medicine? They need to know if you have any of these conditions:  eye disease, vision problems  hearing problems  kidney disease  low blood counts, like white cells, platelets, or red blood cells  tingling of the fingers or toes, or other nerve disorder  an unusual or allergic reaction to cisplatin, carboplatin, oxaliplatin, other medicines, foods, dyes, or preservatives  pregnant or trying to get pregnant  breast-feeding How should I use this medicine? This drug is given as an infusion into a vein. It is administered in a hospital or clinic by a specially trained health care professional. Talk to your pediatrician regarding the use of this medicine in children. Special care may be needed. Overdosage: If you think you have taken too much of this medicine contact a poison control center or emergency room at once. NOTE: This medicine is only for you. Do not share this medicine with others. What if I miss a dose? It is important not to miss a dose. Call your doctor or health care professional if you are unable to keep an appointment. What may interact with this medicine? This medicine may interact with the following medications:  foscarnet  certain antibiotics like amikacin, gentamicin, neomycin, polymyxin B,  streptomycin, tobramycin, vancomycin This list may not describe all possible interactions. Give your health care provider a list of all the medicines, herbs, non-prescription drugs, or dietary supplements you use. Also tell them if you smoke, drink alcohol, or use illegal drugs. Some items may interact with your medicine. What should I watch for while using this medicine? Your condition will be monitored carefully while you are receiving this medicine. You will need important blood work done while you are taking this medicine. This drug may make you feel generally unwell. This is not uncommon, as chemotherapy can affect healthy cells as well as cancer cells. Report any side effects. Continue your course of treatment even though you  feel ill unless your doctor tells you to stop. This medicine may increase your risk of getting an infection. Call your healthcare professional for advice if you get a fever, chills, or sore throat, or other symptoms of a cold or flu. Do not treat yourself. Try to avoid being around people who are sick. Avoid taking medicines that contain aspirin, acetaminophen, ibuprofen, naproxen, or ketoprofen unless instructed by your healthcare professional. These medicines may hide a fever. This medicine may increase your risk to bruise or bleed. Call your doctor or health care professional if you notice any unusual bleeding. Be careful brushing and flossing your teeth or using a toothpick because you may get an infection or bleed more easily. If you have any dental work done, tell your dentist you are receiving this medicine. Do not become pregnant while taking this medicine or for 14 months after stopping it. Women should inform their healthcare professional if they wish to become pregnant or think they might be pregnant. Men should not father a child while taking this medicine and for 11 months after stopping it. There is potential for serious side effects to an unborn child. Talk to your  healthcare professional for more information. Do not breast-feed an infant while taking this medicine. This medicine has caused ovarian failure in some women. This medicine may make it more difficult to get pregnant. Talk to your healthcare professional if you are concerned about your fertility. This medicine has caused decreased sperm counts in some men. This may make it more difficult to father a child. Talk to your healthcare professional if you are concerned about your fertility. Drink fluids as directed while you are taking this medicine. This will help protect your kidneys. Call your doctor or health care professional if you get diarrhea. Do not treat yourself. What side effects may I notice from receiving this medicine? Side effects that you should report to your doctor or health care professional as soon as possible:  allergic reactions like skin rash, itching or hives, swelling of the face, lips, or tongue  blurred vision  changes in vision  decreased hearing or ringing of the ears  nausea, vomiting  pain, redness, or irritation at site where injected  pain, tingling, numbness in the hands or feet  signs and symptoms of bleeding such as bloody or black, tarry stools; red or dark brown urine; spitting up blood or brown material that looks like coffee grounds; red spots on the skin; unusual bruising or bleeding from the eyes, gums, or nose  signs and symptoms of infection like fever; chills; cough; sore throat; pain or trouble passing urine  signs and symptoms of kidney injury like trouble passing urine or change in the amount of urine  signs and symptoms of low red blood cells or anemia such as unusually weak or tired; feeling faint or lightheaded; falls; breathing problems Side effects that usually do not require medical attention (report to your doctor or health care professional if they continue or are bothersome):  loss of appetite  mouth sores  muscle cramps This  list may not describe all possible side effects. Call your doctor for medical advice about side effects. You may report side effects to FDA at 1-800-FDA-1088. Where should I keep my medicine? This drug is given in a hospital or clinic and will not be stored at home. NOTE: This sheet is a summary. It may not cover all possible information. If you have questions about this medicine, talk to your doctor, pharmacist,  or health care provider.  2020 Elsevier/Gold Standard (2018-06-04 15:59:17)

## 2019-12-19 NOTE — Progress Notes (Signed)
Per Dr. Julien Nordmann, ok to treat with Mg 1.5.

## 2019-12-20 ENCOUNTER — Telehealth: Payer: Self-pay | Admitting: *Deleted

## 2019-12-20 ENCOUNTER — Other Ambulatory Visit: Payer: Self-pay | Admitting: *Deleted

## 2019-12-20 NOTE — Patient Outreach (Signed)
[facility]) Care Management  12/20/2019  Chad Gomez 07/18/1945 [id number]   Subjective: Telephone call to patient's wife / designated party release Chad Gomez) home  number, no answer, no voicemail, and unable to leave message.    Objective: Per KPN ([employer], point of care tool) and chart review, patient hospitalized 10/28/2019 - 11/05/2019 for lung cancer, Non-small-cell lung cancer, left upper lobe lingula, status post Bronchoscopy, Robotic-assisted wedge resection of lingula with lymph node biopsy and intercostal nerve blocks, on 11/03/2019, status post VIDEO BRONCHOSCOPY WITH ENDOBRONCHIAL NAVIGATION WITH LUNG MARKINGS, XI ROBOTIC ASSISTED THORASCOPY, -Wedge Resection Lingula Left Upper Lobe, on 10/28/2019.  Patient has a history of BPH (benign prostatic hypertrophy), Chronic kidney disease, COPD (chronic obstructive pulmonary disease), diabetes, bladder stone, colon polyps, kidney stones, melanoma excision (left ear),  Hyperlipidemia, Hypertension, Peripheral vascular disease, Right ureteral stone, Shingles, chronic indwelling foley, Tobacco abuse, Nephrolithiasis, Centrilobular emphysema, Aortoiliac occlusive disease , and status post aortobifemoral bypass surgery on 05/26/2016.  Patient's A1C was 6.2 on 06/07/2019.        Assessment: Received [facility] EMMI General Discharge Red Flag Alert follow up referral on 11/08/2019.   EMMI Red Alert Trigger, Day #1, patient stated no to the following question: Got discharge papers?    EMMI follow up completed and will follow up to assess further care management needs.       Plan: RNCM will call patient's wife / designated party release for telephone outreach attempt, within 7 business days, EMMI follow up, to assess for further CM needs, and proceed with case closure, within 10 business days if no return call, after 4th unsuccessful call.      Chad Gomez H. Annia Friendly, BSN,  [facility] Telephonic CM Phone: [phone] Fax: [phone]

## 2019-12-21 ENCOUNTER — Telehealth: Payer: Self-pay | Admitting: Internal Medicine

## 2019-12-21 NOTE — Telephone Encounter (Signed)
Scheduled per los. Called and spoke with patient. Confirmed appt. Cant come in for lab week of 7/5 due to car being in the shop

## 2019-12-23 ENCOUNTER — Ambulatory Visit: Payer: Self-pay | Admitting: *Deleted

## 2019-12-26 ENCOUNTER — Inpatient Hospital Stay (HOSPITAL_COMMUNITY)
Admission: EM | Admit: 2019-12-26 | Discharge: 2020-01-02 | DRG: 871 | Disposition: A | Discharge status: Home / Self Care | Payer: Medicare Other | Attending: Family Medicine | Admitting: Family Medicine

## 2019-12-26 ENCOUNTER — Emergency Department (HOSPITAL_COMMUNITY): Payer: Medicare Other

## 2019-12-26 ENCOUNTER — Encounter (HOSPITAL_COMMUNITY): Payer: Self-pay | Admitting: Emergency Medicine

## 2019-12-26 ENCOUNTER — Other Ambulatory Visit: Payer: Self-pay

## 2019-12-26 DIAGNOSIS — E1151 Type 2 diabetes mellitus with diabetic peripheral angiopathy without gangrene: Secondary | ICD-10-CM | POA: Diagnosis present

## 2019-12-26 DIAGNOSIS — C3412 Malignant neoplasm of upper lobe, left bronchus or lung: Secondary | ICD-10-CM | POA: Diagnosis present

## 2019-12-26 DIAGNOSIS — E43 Unspecified severe protein-calorie malnutrition: Secondary | ICD-10-CM | POA: Diagnosis present

## 2019-12-26 DIAGNOSIS — K5989 Other specified functional intestinal disorders: Secondary | ICD-10-CM | POA: Diagnosis not present

## 2019-12-26 DIAGNOSIS — E872 Acidosis, unspecified: Secondary | ICD-10-CM

## 2019-12-26 DIAGNOSIS — Z6822 Body mass index (BMI) 22.0-22.9, adult: Secondary | ICD-10-CM

## 2019-12-26 DIAGNOSIS — A419 Sepsis, unspecified organism: Secondary | ICD-10-CM | POA: Diagnosis not present

## 2019-12-26 DIAGNOSIS — R918 Other nonspecific abnormal finding of lung field: Secondary | ICD-10-CM | POA: Diagnosis not present

## 2019-12-26 DIAGNOSIS — K439 Ventral hernia without obstruction or gangrene: Secondary | ICD-10-CM

## 2019-12-26 DIAGNOSIS — R05 Cough: Secondary | ICD-10-CM | POA: Diagnosis not present

## 2019-12-26 DIAGNOSIS — I70213 Atherosclerosis of native arteries of extremities with intermittent claudication, bilateral legs: Secondary | ICD-10-CM | POA: Diagnosis not present

## 2019-12-26 DIAGNOSIS — C3410 Malignant neoplasm of upper lobe, unspecified bronchus or lung: Secondary | ICD-10-CM | POA: Diagnosis not present

## 2019-12-26 DIAGNOSIS — E118 Type 2 diabetes mellitus with unspecified complications: Secondary | ICD-10-CM | POA: Diagnosis present

## 2019-12-26 DIAGNOSIS — N401 Enlarged prostate with lower urinary tract symptoms: Secondary | ICD-10-CM | POA: Diagnosis present

## 2019-12-26 DIAGNOSIS — D709 Neutropenia, unspecified: Secondary | ICD-10-CM

## 2019-12-26 DIAGNOSIS — J449 Chronic obstructive pulmonary disease, unspecified: Secondary | ICD-10-CM | POA: Diagnosis present

## 2019-12-26 DIAGNOSIS — D701 Agranulocytosis secondary to cancer chemotherapy: Secondary | ICD-10-CM | POA: Diagnosis present

## 2019-12-26 DIAGNOSIS — Z978 Presence of other specified devices: Secondary | ICD-10-CM | POA: Diagnosis not present

## 2019-12-26 DIAGNOSIS — R6520 Severe sepsis without septic shock: Secondary | ICD-10-CM | POA: Diagnosis not present

## 2019-12-26 DIAGNOSIS — Z7982 Long term (current) use of aspirin: Secondary | ICD-10-CM

## 2019-12-26 DIAGNOSIS — B379 Candidiasis, unspecified: Secondary | ICD-10-CM | POA: Diagnosis not present

## 2019-12-26 DIAGNOSIS — F17210 Nicotine dependence, cigarettes, uncomplicated: Secondary | ICD-10-CM | POA: Diagnosis present

## 2019-12-26 DIAGNOSIS — Z7951 Long term (current) use of inhaled steroids: Secondary | ICD-10-CM

## 2019-12-26 DIAGNOSIS — R5081 Fever presenting with conditions classified elsewhere: Secondary | ICD-10-CM | POA: Diagnosis present

## 2019-12-26 DIAGNOSIS — R338 Other retention of urine: Secondary | ICD-10-CM | POA: Diagnosis present

## 2019-12-26 DIAGNOSIS — Z85820 Personal history of malignant melanoma of skin: Secondary | ICD-10-CM | POA: Diagnosis not present

## 2019-12-26 DIAGNOSIS — K56600 Partial intestinal obstruction, unspecified as to cause: Secondary | ICD-10-CM | POA: Diagnosis not present

## 2019-12-26 DIAGNOSIS — Z20822 Contact with and (suspected) exposure to covid-19: Secondary | ICD-10-CM | POA: Diagnosis not present

## 2019-12-26 DIAGNOSIS — K460 Unspecified abdominal hernia with obstruction, without gangrene: Secondary | ICD-10-CM

## 2019-12-26 DIAGNOSIS — K5289 Other specified noninfective gastroenteritis and colitis: Secondary | ICD-10-CM | POA: Diagnosis not present

## 2019-12-26 DIAGNOSIS — Z720 Tobacco use: Secondary | ICD-10-CM | POA: Diagnosis present

## 2019-12-26 DIAGNOSIS — T451X5A Adverse effect of antineoplastic and immunosuppressive drugs, initial encounter: Secondary | ICD-10-CM | POA: Diagnosis not present

## 2019-12-26 DIAGNOSIS — N179 Acute kidney failure, unspecified: Secondary | ICD-10-CM | POA: Diagnosis not present

## 2019-12-26 DIAGNOSIS — Z7984 Long term (current) use of oral hypoglycemic drugs: Secondary | ICD-10-CM

## 2019-12-26 DIAGNOSIS — E785 Hyperlipidemia, unspecified: Secondary | ICD-10-CM | POA: Diagnosis present

## 2019-12-26 DIAGNOSIS — Z902 Acquired absence of lung [part of]: Secondary | ICD-10-CM

## 2019-12-26 DIAGNOSIS — K56609 Unspecified intestinal obstruction, unspecified as to partial versus complete obstruction: Secondary | ICD-10-CM | POA: Diagnosis present

## 2019-12-26 DIAGNOSIS — J432 Centrilobular emphysema: Secondary | ICD-10-CM | POA: Diagnosis not present

## 2019-12-26 DIAGNOSIS — N390 Urinary tract infection, site not specified: Secondary | ICD-10-CM | POA: Diagnosis not present

## 2019-12-26 DIAGNOSIS — E860 Dehydration: Secondary | ICD-10-CM | POA: Diagnosis present

## 2019-12-26 DIAGNOSIS — E876 Hypokalemia: Secondary | ICD-10-CM | POA: Diagnosis present

## 2019-12-26 DIAGNOSIS — E1122 Type 2 diabetes mellitus with diabetic chronic kidney disease: Secondary | ICD-10-CM | POA: Diagnosis not present

## 2019-12-26 DIAGNOSIS — Z95828 Presence of other vascular implants and grafts: Secondary | ICD-10-CM

## 2019-12-26 DIAGNOSIS — Z79899 Other long term (current) drug therapy: Secondary | ICD-10-CM | POA: Diagnosis not present

## 2019-12-26 DIAGNOSIS — K7689 Other specified diseases of liver: Secondary | ICD-10-CM | POA: Diagnosis not present

## 2019-12-26 DIAGNOSIS — I10 Essential (primary) hypertension: Secondary | ICD-10-CM | POA: Diagnosis not present

## 2019-12-26 DIAGNOSIS — D61810 Antineoplastic chemotherapy induced pancytopenia: Secondary | ICD-10-CM | POA: Diagnosis present

## 2019-12-26 DIAGNOSIS — Z452 Encounter for adjustment and management of vascular access device: Secondary | ICD-10-CM

## 2019-12-26 DIAGNOSIS — K529 Noninfective gastroenteritis and colitis, unspecified: Secondary | ICD-10-CM | POA: Diagnosis present

## 2019-12-26 DIAGNOSIS — N281 Cyst of kidney, acquired: Secondary | ICD-10-CM | POA: Diagnosis not present

## 2019-12-26 DIAGNOSIS — K436 Other and unspecified ventral hernia with obstruction, without gangrene: Secondary | ICD-10-CM

## 2019-12-26 DIAGNOSIS — I700 Atherosclerosis of aorta: Secondary | ICD-10-CM | POA: Diagnosis not present

## 2019-12-26 HISTORY — DX: Unspecified intestinal obstruction, unspecified as to partial versus complete obstruction: K56.609

## 2019-12-26 LAB — COMPREHENSIVE METABOLIC PANEL
ALT: 31 U/L (ref 0–44)
AST: 23 U/L (ref 15–41)
Albumin: 4 g/dL (ref 3.5–5.0)
Alkaline Phosphatase: 71 U/L (ref 38–126)
Anion gap: 17 — ABNORMAL HIGH (ref 5–15)
BUN: 47 mg/dL — ABNORMAL HIGH (ref 8–23)
CO2: 28 mmol/L (ref 22–32)
Calcium: 10.5 mg/dL — ABNORMAL HIGH (ref 8.9–10.3)
Chloride: 91 mmol/L — ABNORMAL LOW (ref 98–111)
Creatinine, Ser: 1.76 mg/dL — ABNORMAL HIGH (ref 0.61–1.24)
GFR calc Af Amer: 44 mL/min — ABNORMAL LOW (ref >60)
GFR calc non Af Amer: 38 mL/min — ABNORMAL LOW (ref >60)
Glucose, Bld: 215 mg/dL — ABNORMAL HIGH (ref 70–99)
Potassium: 4.2 mmol/L (ref 3.5–5.1)
Sodium: 136 mmol/L (ref 135–145)
Total Bilirubin: 1.4 mg/dL — ABNORMAL HIGH (ref 0.3–1.2)
Total Protein: 7.1 g/dL (ref 6.5–8.1)

## 2019-12-26 LAB — LACTIC ACID, PLASMA
Lactic Acid, Venous: 2.8 mmol/L (ref 0.5–1.9)
Lactic Acid, Venous: 3.4 mmol/L (ref 0.5–1.9)

## 2019-12-26 LAB — CBC WITH DIFFERENTIAL/PLATELET
Abs Immature Granulocytes: 0 10*3/uL (ref 0.00–0.07)
Band Neutrophils: 4 %
Basophils Absolute: 0 10*3/uL (ref 0.0–0.1)
Basophils Relative: 0 %
Eosinophils Absolute: 0 10*3/uL (ref 0.0–0.5)
Eosinophils Relative: 4 %
HCT: 50.9 % (ref 39.0–52.0)
Hemoglobin: 17.5 g/dL — ABNORMAL HIGH (ref 13.0–17.0)
Lymphocytes Relative: 35 %
Lymphs Abs: 0.3 10*3/uL — ABNORMAL LOW (ref 0.7–4.0)
MCH: 31 pg (ref 26.0–34.0)
MCHC: 34.4 g/dL (ref 30.0–36.0)
MCV: 90.1 fL (ref 80.0–100.0)
Monocytes Absolute: 0.1 10*3/uL (ref 0.1–1.0)
Monocytes Relative: 7 %
Myelocytes: 1 %
Neutro Abs: 0.4 10*3/uL — ABNORMAL LOW (ref 1.7–7.7)
Neutrophils Relative %: 49 %
Platelets: 130 10*3/uL — ABNORMAL LOW (ref 150–400)
RBC: 5.65 MIL/uL (ref 4.22–5.81)
RDW: 13 % (ref 11.5–15.5)
WBC: 0.8 10*3/uL — CL (ref 4.0–10.5)
nRBC: 0 % (ref 0.0–0.2)

## 2019-12-26 LAB — SARS CORONAVIRUS 2 BY RT PCR (HOSPITAL ORDER, PERFORMED IN ~~LOC~~ HOSPITAL LAB): SARS Coronavirus 2: NEGATIVE

## 2019-12-26 LAB — URINALYSIS, ROUTINE W REFLEX MICROSCOPIC
Bilirubin Urine: NEGATIVE
Glucose, UA: NEGATIVE mg/dL
Ketones, ur: 20 mg/dL — AB
Nitrite: NEGATIVE
Protein, ur: 30 mg/dL — AB
Specific Gravity, Urine: 1.018 (ref 1.005–1.030)
pH: 5 (ref 5.0–8.0)

## 2019-12-26 LAB — PROTIME-INR
INR: 1.1 (ref 0.8–1.2)
Prothrombin Time: 13.8 seconds (ref 11.4–15.2)

## 2019-12-26 LAB — GLUCOSE, CAPILLARY: Glucose-Capillary: 152 mg/dL — ABNORMAL HIGH (ref 70–99)

## 2019-12-26 LAB — HEMOGLOBIN A1C
Hgb A1c MFr Bld: 6.1 % — ABNORMAL HIGH (ref 4.8–5.6)
Mean Plasma Glucose: 128.37 mg/dL

## 2019-12-26 LAB — LIPASE, BLOOD: Lipase: 18 U/L (ref 11–51)

## 2019-12-26 MED ORDER — ARFORMOTEROL TARTRATE 15 MCG/2ML IN NEBU
15.0000 ug | INHALATION_SOLUTION | Freq: Two times a day (BID) | RESPIRATORY_TRACT | Status: DC
  Administered 2019-12-26 – 2020-01-02 (×13): 15 ug via RESPIRATORY_TRACT
  Filled 2019-12-26 (×14): qty 2

## 2019-12-26 MED ORDER — ONDANSETRON HCL 4 MG/2ML IJ SOLN
4.0000 mg | Freq: Four times a day (QID) | INTRAMUSCULAR | Status: DC | PRN
  Administered 2019-12-28: 4 mg via INTRAVENOUS
  Filled 2019-12-26: qty 2

## 2019-12-26 MED ORDER — HYDROMORPHONE HCL 1 MG/ML IJ SOLN
0.5000 mg | INTRAMUSCULAR | Status: DC | PRN
  Administered 2019-12-27: 0.5 mg via INTRAVENOUS
  Administered 2019-12-28 – 2019-12-29 (×3): 1 mg via INTRAVENOUS
  Administered 2019-12-29: 0.5 mg via INTRAVENOUS
  Administered 2019-12-30: 1 mg via INTRAVENOUS
  Administered 2019-12-30: 0.5 mg via INTRAVENOUS
  Administered 2019-12-31: 1 mg via INTRAVENOUS
  Administered 2020-01-01: 0.5 mg via INTRAVENOUS
  Administered 2020-01-02: 1 mg via INTRAVENOUS
  Filled 2019-12-26 (×10): qty 1

## 2019-12-26 MED ORDER — PIPERACILLIN-TAZOBACTAM 3.375 G IVPB 30 MIN
3.3750 g | Freq: Once | INTRAVENOUS | Status: AC
  Administered 2019-12-26: 3.375 g via INTRAVENOUS
  Filled 2019-12-26: qty 50

## 2019-12-26 MED ORDER — SODIUM CHLORIDE 0.9 % IV SOLN: 2.0000 g | Freq: Once | INTRAVENOUS | Status: DC

## 2019-12-26 MED ORDER — SODIUM CHLORIDE 0.9 % IV SOLN
2.0000 g | Freq: Two times a day (BID) | INTRAVENOUS | Status: DC
  Administered 2019-12-27 – 2019-12-28 (×3): 2 g via INTRAVENOUS
  Filled 2019-12-26 (×3): qty 2

## 2019-12-26 MED ORDER — SODIUM CHLORIDE (PF) 0.9 % IJ SOLN
INTRAMUSCULAR | Status: AC
  Filled 2019-12-26: qty 50

## 2019-12-26 MED ORDER — LACTATED RINGERS IV BOLUS
30.0000 mL/kg | Freq: Once | INTRAVENOUS | Status: AC
  Administered 2019-12-26: 2289 mL via INTRAVENOUS

## 2019-12-26 MED ORDER — LACTATED RINGERS IV BOLUS
1000.0000 mL | Freq: Once | INTRAVENOUS | Status: AC
  Administered 2019-12-26: 1000 mL via INTRAVENOUS

## 2019-12-26 MED ORDER — CHLORHEXIDINE GLUCONATE CLOTH 2 % EX PADS
6.0000 | MEDICATED_PAD | Freq: Every day | CUTANEOUS | Status: DC
  Administered 2019-12-27 – 2020-01-02 (×7): 6 via TOPICAL

## 2019-12-26 MED ORDER — FLUTICASONE PROPIONATE 50 MCG/ACT NA SUSP
2.0000 | Freq: Every day | NASAL | Status: DC
  Administered 2019-12-26 – 2020-01-02 (×8): 2 via NASAL
  Filled 2019-12-26: qty 16

## 2019-12-26 MED ORDER — METRONIDAZOLE IN NACL 5-0.79 MG/ML-% IV SOLN
500.0000 mg | Freq: Three times a day (TID) | INTRAVENOUS | Status: DC
  Administered 2019-12-26 – 2020-01-02 (×20): 500 mg via INTRAVENOUS
  Filled 2019-12-26 (×19): qty 100

## 2019-12-26 MED ORDER — VANCOMYCIN HCL 1500 MG/300ML IV SOLN
1500.0000 mg | Freq: Once | INTRAVENOUS | Status: AC
  Administered 2019-12-26: 1500 mg via INTRAVENOUS
  Filled 2019-12-26: qty 300

## 2019-12-26 MED ORDER — ENOXAPARIN SODIUM 40 MG/0.4ML ~~LOC~~ SOLN
40.0000 mg | SUBCUTANEOUS | Status: DC
  Administered 2019-12-26 – 2019-12-27 (×2): 40 mg via SUBCUTANEOUS
  Filled 2019-12-26 (×2): qty 0.4

## 2019-12-26 MED ORDER — IOHEXOL 300 MG/ML  SOLN
75.0000 mL | Freq: Once | INTRAMUSCULAR | Status: AC | PRN
  Administered 2019-12-26: 75 mL via INTRAVENOUS

## 2019-12-26 MED ORDER — NICOTINE 21 MG/24HR TD PT24
21.0000 mg | MEDICATED_PATCH | Freq: Every day | TRANSDERMAL | Status: DC
  Administered 2019-12-26 – 2020-01-01 (×7): 21 mg via TRANSDERMAL
  Filled 2019-12-26 (×8): qty 1

## 2019-12-26 MED ORDER — SODIUM CHLORIDE 0.9 % IV SOLN
2.0000 g | Freq: Once | INTRAVENOUS | Status: DC
  Filled 2019-12-26: qty 2

## 2019-12-26 MED ORDER — FENTANYL CITRATE (PF) 100 MCG/2ML IJ SOLN
100.0000 ug | Freq: Once | INTRAMUSCULAR | Status: AC
  Administered 2019-12-26: 100 ug via INTRAVENOUS
  Filled 2019-12-26: qty 2

## 2019-12-26 MED ORDER — VANCOMYCIN HCL IN DEXTROSE 1-5 GM/200ML-% IV SOLN
1000.0000 mg | INTRAVENOUS | Status: DC
  Administered 2019-12-27: 1000 mg via INTRAVENOUS
  Filled 2019-12-26: qty 200

## 2019-12-26 MED ORDER — TBO-FILGRASTIM 300 MCG/0.5ML ~~LOC~~ SOSY
300.0000 ug | PREFILLED_SYRINGE | Freq: Once | SUBCUTANEOUS | Status: AC
  Administered 2019-12-26: 300 ug via SUBCUTANEOUS
  Filled 2019-12-26: qty 0.5

## 2019-12-26 MED ORDER — ALBUTEROL SULFATE (2.5 MG/3ML) 0.083% IN NEBU: 2.5000 mg | INHALATION_SOLUTION | Freq: Four times a day (QID) | RESPIRATORY_TRACT | Status: DC | PRN

## 2019-12-26 MED ORDER — SODIUM CHLORIDE 0.9 % IV SOLN
2.0000 g | Freq: Once | INTRAVENOUS | Status: AC
  Administered 2019-12-26: 2 g via INTRAVENOUS
  Filled 2019-12-26: qty 2

## 2019-12-26 MED ORDER — LACTATED RINGERS IV SOLN: INTRAVENOUS | Status: DC

## 2019-12-26 MED ORDER — HYDROMORPHONE HCL 1 MG/ML IJ SOLN
0.5000 mg | Freq: Once | INTRAMUSCULAR | Status: AC
  Administered 2019-12-26: 0.5 mg via INTRAVENOUS
  Filled 2019-12-26: qty 1

## 2019-12-26 MED ORDER — INSULIN ASPART 100 UNIT/ML ~~LOC~~ SOLN
0.0000 [IU] | Freq: Three times a day (TID) | SUBCUTANEOUS | Status: DC
  Administered 2019-12-27: 1 [IU] via SUBCUTANEOUS

## 2019-12-26 NOTE — ED Provider Notes (Signed)
[facility] DEPT Provider Note   CSN: [id number] Arrival date & time: 12/26/19  6962     History Chief Complaint  Patient presents with  . Abdominal Pain    Chad Gomez is a 74 y.o. male.  HPI   3yM presenting with wife for evaluation of abdominal pain and generally not feeling well. Began feeling poorly 3-4 days ago. Not eating. Complaining of abdominal pain. No v/d. Chronic foley. No acute urinary complaints. Sleeping 18+ hours/day.    Past Medical History:  Diagnosis Date  . BPH (benign prostatic hypertrophy)    weak stream  . Chronic kidney disease   . Complication of anesthesia    urinary retention  . COPD (chronic obstructive pulmonary disease) ([place])   . Diabetes mellitus without complication ([place])    Type II  . History of bladder stone   . History of colon polyps    08/2013   pre-canerous  . History of kidney stones   . History of melanoma excision    LEFT EAR  . Hyperlipidemia   . Hypertension   . Iron deficiency   . Lung cancer, lingula ([place]) 11/01/2019  . Open wound    left lower jaw; followed by ENT  . Peripheral vascular disease ([place])   . Right ureteral stone   . Shingles 05-15-2015    Patient Active Problem List   Diagnosis Date Noted  . Encounter for antineoplastic immunotherapy 12/19/2019  . Adenocarcinoma, lung, left ([place]) 12/16/2019  . Encounter for antineoplastic chemotherapy 11/30/2019  . Goals of care, counseling/discussion 11/09/2019  . Lung cancer, lingula ([place]) 11/01/2019  . S/P partial lobectomy of lung 10/28/2019  . Dyslipidemia 10/06/2019  . Type 2 diabetes mellitus with complication, without long-term current use of insulin ([place]) 10/06/2019  . Educated about COVID-19 virus infection 10/06/2019  . Tobacco abuse 09/06/2019  . Hypertension 09/06/2019  . Nephrolithiasis 09/06/2019  . Melanoma ([place]) 09/06/2019  . Centrilobular emphysema ([place]) 05/23/2019  . Stage 4 very severe COPD by GOLD  classification ([place]) 05/23/2019  . Atherosclerosis of native artery of both lower extremities with intermittent claudication ([place]) 05/26/2016  . S/P aortobifemoral bypass surgery 05/26/2016  . Post-op pain 01/15/2015  . Aortoiliac occlusive disease ([place]) 12/28/2014  . Preop cardiovascular exam 11/24/2014  . Ureteral stone with hydronephrosis 03/21/2014  . Ureteral stone 03/21/2014  . Ulnar neuropathy 02/07/2014  . BP (high blood pressure) 02/07/2014  . Calculus of kidney 02/07/2014  . Compulsive tobacco user syndrome 02/07/2014  . Allergic rhinitis 02/07/2014  . Malignant melanoma ([place]) 02/07/2014  . Right ureteral stone 10/13/2013  . Bladder stone 10/13/2013    Past Surgical History:  Procedure Laterality Date  . ABDOMINAL AORTAGRAM  11/01/2014   Procedure: Abdominal Aortagram;  Surgeon: Serafina Mitchell, MD;  Location: [facility];  Service: Cardiovascular;;  . AORTA - BILATERAL FEMORAL ARTERY BYPASS GRAFT N/A 12/28/2014   Procedure: AORTOBIFEMORAL BYPASS GRAFT;  Surgeon: Serafina Mitchell, MD;  Location: [facility];  Service: Vascular;  Laterality: N/A;  . BRONCHIAL BRUSHINGS  09/06/2019   Procedure: BRONCHIAL BRUSHINGS;  Surgeon: Collene Gobble, MD;  Location: [facility] ENDOSCOPY;  Service: Pulmonary;;  left upper lobe nodule  . BRONCHIAL WASHINGS  09/06/2019   Procedure: BRONCHIAL WASHINGS;  Surgeon: Collene Gobble, MD;  Location: [facility] ENDOSCOPY;  Service: Pulmonary;;  . COLONOSCOPY W/ POLYPECTOMY  09-13-2013  . CYSTOLITHALOPAXY OF BLADDER STONE/ TRANSRECTAL ULTRASOUND PROSTATE BX  08-19-2005  . CYSTOSCOPY W/ RETROGRADES Right 12/02/2012   Procedure:  CYSTOSCOPY WITH RETROGRADE PYELOGRAM;  Surgeon: Malka So, MD;  Location: [facility];  Service: Urology;  Laterality: Right;  . CYSTOSCOPY W/ RETROGRADES Bilateral 10/13/2013   Procedure: CYSTOSCOPY WITH RETROGRADE PYELOGRAM;  Surgeon: Irine Seal, MD;  Location: [facility];  Service: Urology;  Laterality:  Bilateral;  . CYSTOSCOPY W/ URETERAL STENT PLACEMENT Left 04/11/2014   Procedure: CYSTOSCOPY WITH STENT REPLACEMENT;  Surgeon: Malka So, MD;  Location: [facility];  Service: Urology;  Laterality: Left;  . CYSTOSCOPY WITH LITHOLAPAXY Right 12/02/2012   Procedure: CYSTOSCOPY WITH stone extraction;  Surgeon: Malka So, MD;  Location: [facility];  Service: Urology;  Laterality: Right;  . CYSTOSCOPY WITH LITHOLAPAXY Right 10/13/2013   Procedure: CYSTOSCOPY WITH LITHOLAPAXY, removal of urethral stone with basket;  Surgeon: Irine Seal, MD;  Location: [facility];  Service: Urology;  Laterality: Right;  . CYSTOSCOPY WITH RETROGRADE PYELOGRAM, URETEROSCOPY AND STENT PLACEMENT Left 03/21/2014   Procedure: CYSTOSCOPY WITH RETROGRADE PYELOGRAM, AND LEFT STENT PLACEMENT;  Surgeon: Alexis Frock, MD;  Location: [facility];  Service: Urology;  Laterality: Left;  . CYSTOSCOPY WITH RETROGRADE PYELOGRAM, URETEROSCOPY AND STENT PLACEMENT  09/09/2016   Procedure: CYSTOSCOPY WITH RIGHT  RETROGRADE PYELOGRAM, URETEROSCOPY WITH LASER BASKET EXTRACTION OF STONES  AND STENT PLACEMENT;  Surgeon: Irine Seal, MD;  Location: [facility];  Service: Urology;;  . Consuela Mimes WITH URETEROSCOPY Left 04/11/2014   Procedure: CYSTOSCOPY WITH URETEROSCOPY/ STONE EXTRACTION;  Surgeon: Malka So, MD;  Location: [facility];  Service: Urology;  Laterality: Left;  . FINE NEEDLE ASPIRATION  09/06/2019   Procedure: FINE NEEDLE ASPIRATION;  Surgeon: Collene Gobble, MD;  Location: [facility] ENDOSCOPY;  Service: Pulmonary;;  left upper lobe - lung  . HOLMIUM LASER APPLICATION Right 12/29/6281   Procedure: HOLMIUM LASER APPLICATION;  Surgeon: Malka So, MD;  Location: [facility];  Service: Urology;  Laterality: Right;  . HOLMIUM LASER APPLICATION Left 66/29/4765   Procedure: HOLMIUM LASER APPLICATION;  Surgeon: Malka So, MD;  Location: [facility];  Service: Urology;  Laterality: Left;  . HOLMIUM LASER APPLICATION Right 4/65/0354   Procedure: HOLMIUM LASER APPLICATION;  Surgeon: Irine Seal, MD;  Location: [facility];  Service: Urology;  Laterality: Right;  . INGUINAL HERNIA REPAIR Right 1954  . INTERCOSTAL NERVE BLOCK Left 10/28/2019   Procedure: Intercostal Nerve Block;  Surgeon: [name], MD;  Location: [facility];  Service: Thoracic;  Laterality: Left;  . LITHOTRIPSY  2007 & 02/2013  . LUNG BIOPSY  09/06/2019   Procedure: LUNG BIOPSY;  Surgeon: Collene Gobble, MD;  Location: [facility] ENDOSCOPY;  Service: Pulmonary;;  left upper lob lung  . [place]  . MICROLARYNGOSCOPY Right 02/18/2016   Procedure: MICRO DIRECT LARYNGOSCOPY EXCISIONAL BIOPSY OF RIGHT VOCAL CORD MASS;  Surgeon: Leta Baptist, MD;  Location: [facility];  Service: ENT;  Laterality: Right;  . [place]   left ear  . MULTIPLE TOOTH EXTRACTIONS  1994  . NEPHROLITHOTOMY Left 01/27/2013   Procedure: NEPHROLITHOTOMY PERCUTANEOUS FIRST LOOK;  Surgeon: Malka So, MD;  Location[facility];  Service: Urology;  Laterality: Left;  . NODE DISSECTION  10/28/2019   Procedure: Node Dissection;  Surgeon: [name], MD;  Location: [facility];  Service: Thoracic;;  . PERIPHERAL VASCULAR CATHETERIZATION N/A 11/01/2014   Procedure: Lower Extremity Angiography;  Surgeon: Serafina Mitchell, MD;  Location: [facility];  Service: Cardiovascular;  Laterality: N/A;  . URETEROSCOPY Right 12/02/2012   Procedure: RIGHT URETEROSCOPY STONE EXTRACTION WITH  STENT PLACEMENT;  Surgeon: Malka So, MD;  Location: [facility];  Service: Urology;  Laterality: Right;  [place] VIDEO BRONCHOSCOPY  10/28/2019   VIDEO BRONCHOSCOPY WITH ENDOBRONCHIAL NAVIGATION WITH LUNG MARKINGS (N/A  . VIDEO BRONCHOSCOPY WITH ENDOBRONCHIAL NAVIGATION N/A 09/06/2019   Procedure: VIDEO BRONCHOSCOPY WITH ENDOBRONCHIAL NAVIGATION;  Surgeon: Collene Gobble, MD;  Location: [facility]OSCOPY;  Service: Pulmonary;  Laterality: N/A;  . VIDEO BRONCHOSCOPY WITH ENDOBRONCHIAL NAVIGATION N/A 10/28/2019   Procedure: VIDEO BRONCHOSCOPY WITH ENDOBRONCHIAL NAVIGATION WITH LUNG MARKINGS;  Surgeon: [name], MD;  Location: [facility];  Service: Thoracic;  Laterality: N/A;  . WEDGE RESECTION  10/28/2019   LEFT UPPER LOBE       Family History  Problem Relation Age of Onset  . Dementia Mother   . Hypertension Mother   . Stroke Mother 20  . Dementia Father   . Hypertension Sister   . Colon cancer Neg Hx     Social History   Tobacco Use  . Smoking status: Current Every Day Smoker    Packs/day: 1.00    Years: 59.00    Pack years: 59.00    Types: Cigarettes    Start date: 06/24/1959  . Smokeless tobacco: Never Used  Vaping Use  . Vaping Use: Former  Substance Use Topics  . Alcohol use: No    Alcohol/week: 0.0 standard drinks  . Drug use: No    Home Medications Prior to Admission medications   Medication Sig Start Date End Date Taking? Authorizing Provider  acetaminophen (TYLENOL) 500 MG tablet Take 2 tablets (1,000 mg total) by mouth every 6 (six) hours as needed for mild pain. 11/05/19  Yes Barrett, Erin R, PA-C  albuterol (VENTOLIN HFA) 108 (90 Base) MCG/ACT inhaler Inhale 2 puffs into the lungs every 6 (six) hours as needed for wheezing or shortness of breath. 12/08/19  Yes [name], MD  amLODipine (NORVASC) 10 MG tablet Take 10 mg by mouth daily.   Yes [provider]  aspirin EC 81 MG tablet Take 81 mg by mouth daily.   Yes [provider]  atorvastatin (LIPITOR) 40 MG tablet Take 40 mg by mouth daily. 07/15/19  Yes [provider]  cetirizine (ZYRTEC) 10 MG tablet Take 10 mg by mouth daily.   Yes [provider]  Cholecalciferol (VITAMIN D-3) 1000 units CAPS Take 1,000 Units by mouth daily.    Yes [provider]  dextromethorphan-guaiFENesin (MUCINEX DM) 30-600 MG 12hr tablet  Take 1 tablet by mouth 2 (two) times daily as needed for cough. 11/04/19  Yes Barrett, Erin R, PA-C  ezetimibe (ZETIA) 10 MG tablet Take 5 mg by mouth daily.   Yes [provider]  finasteride (PROSCAR) 5 MG tablet Take 5 mg by mouth daily. 04/25/19  Yes [provider]  fluticasone (FLONASE) 50 MCG/ACT nasal spray Place 2 sprays into both nostrils daily.  08/09/19  Yes [provider]  indapamide (LOZOL) 1.25 MG tablet Take 1.25 mg by mouth daily.   Yes [provider]  magnesium oxide (MAG-OX) 400 (241.3 Mg) MG tablet Take 1 tablet (400 mg total) by mouth daily. 12/15/19  Yes Heilingoetter, Cassandra L, PA-C  metFORMIN (GLUCOPHAGE-XR) 500 MG 24 hr tablet Take 500-1,000 mg by mouth See admin instructions. Take 500 mg every morning and take 1000 mg at supper  04/25/19  Yes [provider]  potassium citrate (UROCIT-K) 10 MEQ (1080 MG) SR tablet Take 10 mEq by mouth in the morning and at bedtime.    Yes [provider]  prochlorperazine (COMPAZINE) 10 MG tablet Take 1 tablet (10 mg total) by mouth every 6 (six) hours as needed. 11/30/19  Yes Heilingoetter, Cassandra L, PA-C  tamsulosin (FLOMAX) 0.4 MG CAPS capsule Take 0.4 mg by mouth daily after supper.  08/05/19  Yes [provider]  Tiotropium Bromide-Olodaterol (STIOLTO RESPIMAT) 2.5-2.5 MCG/ACT AERS Inhale 2 puffs into the lungs daily. 09/25/17  Yes Juanito Doom, MD  traMADol (ULTRAM) 50 MG tablet Take 1 tablet (50 mg total) by mouth every 6 (six) hours as needed (mild pain). 12/08/19  Yes [name], MD  PROAIR HFA 108 [phone] Base) MCG/ACT inhaler INHALE 2 PUFFS BY MOUTH EVERY 6 HOURS AS NEEDED FOR WHEEZING AND FOR SHORTNESS OF BREATH Patient not taking: Reported on 12/26/2019 11/15/19   Collene Gobble, MD    Allergies    Morphine and related, Tape, Amoxicillin, and Naproxen  Review of Systems   Review of Systems All systems reviewed and negative, other than as noted in HPI.   Physical Exam Updated Vital Signs BP 112/78   Pulse (!) 120   Temp 99.6 F (37.6 C)   Resp (!) 29   Ht 5\' 10"  (1.778 m)   Wt 76.2 kg   SpO2 96%   BMI 24.12 kg/m   Physical Exam Vitals and nursing note reviewed.  Constitutional:      Comments: Tired. Opens eyes and answering questions.   HENT:     Head: Normocephalic and atraumatic.  Eyes:     General:        Right eye: No discharge.        Left eye: No discharge.     Conjunctiva/sclera: Conjunctivae normal.  Cardiovascular:     Rate and Rhythm: Regular rhythm. Tachycardia present.     Heart sounds: Normal heart sounds. No murmur heard.  No friction rub. No gallop.   Pulmonary:     Effort: Pulmonary effort is normal. No respiratory distress.     Breath sounds: Normal breath sounds.  Abdominal:     General: There is no distension.     Palpations: Abdomen is soft.     Tenderness: There is abdominal tenderness.     Comments: Well healed laparotomy scar. Soft. Significant tenderness with guarding periumbilically, suprapubic and L abdomen. TTP L lower chest wall around surgical scar (chest tube?). No erythema or mottling. No crepitus appreciated.   Musculoskeletal:        General: No tenderness.     Cervical back: Neck supple.  Skin:    General: Skin is warm and dry.     ED Results / Procedures / Treatments   Labs (all labs ordered are listed, but only abnormal results are displayed) Labs Reviewed  COMPREHENSIVE METABOLIC PANEL - Abnormal; Notable for the following components:      Result Value   Chloride 91 (*)    Glucose, Bld 215 (*)    BUN 47 (*)    Creatinine, Ser 1.76 (*)    Calcium 10.5 (*)    Total Bilirubin 1.4 (*)    GFR calc non Af Amer 38 (*)    GFR calc Af Amer 44 (*)    Anion gap 17 (*)    All other components within normal limits  LACTIC ACID, PLASMA - Abnormal; Notable for the  following components:   Lactic Acid, Venous 2.8 (*)    All other components within normal limits  LACTIC ACID, PLASMA -  Abnormal; Notable for the following components:   Lactic Acid, Venous 3.4 (*)    All other components within normal limits  CBC WITH DIFFERENTIAL/PLATELET - Abnormal; Notable for the following components:   WBC 0.8 (*)    Hemoglobin 17.5 (*)    Platelets 130 (*)    Neutro Abs 0.4 (*)    Lymphs Abs 0.3 (*)    All other components within normal limits  URINALYSIS, ROUTINE W REFLEX MICROSCOPIC - Abnormal; Notable for the following components:   Color, Urine AMBER (*)    APPearance HAZY (*)    Hgb urine dipstick MODERATE (*)    Ketones, ur 20 (*)    Protein, ur 30 (*)    Leukocytes,Ua SMALL (*)    Bacteria, UA MANY (*)    All other components within normal limits  SARS CORONAVIRUS 2 BY RT PCR (HOSPITAL ORDER, [facility])  CULTURE, BLOOD (ROUTINE X 2)  CULTURE, BLOOD (ROUTINE X 2)  PROTIME-INR  LIPASE, BLOOD    EKG None  Radiology DG Chest 2 View  Result Date: 12/26/2019 CLINICAL DATA:  Congestion and cough this week. EXAM: CHEST - 2 VIEW COMPARISON:  December 08, 2019 FINDINGS: The heart size and mediastinal contours are within normal limits. Both lungs are clear. The visualized skeletal structures are unremarkable. IMPRESSION: No active cardiopulmonary disease. Electronically Signed   By: Abelardo Diesel M.D.   On: 12/26/2019 11:04    Procedures Procedures (including critical care time)  CRITICAL CARE Performed by: Virgel Manifold Total critical care time: 40 minutes Critical care time was exclusive of separately billable procedures and treating other patients. Critical care was necessary to treat or prevent imminent or life-threatening deterioration. Critical care was time spent personally by me on the following activities: development of treatment plan with patient and/or surrogate as well as nursing, discussions with consultants, evaluation of patient's response to treatment, examination of patient, obtaining history from patient or surrogate, ordering and  performing treatments and interventions, ordering and review of laboratory studies, ordering and review of radiographic studies, pulse oximetry and re-evaluation of patient's condition.   Medications Ordered in ED Medications  vancomycin (VANCOREADY) IVPB 1500 mg/300 mL (1,500 mg Intravenous New Bag/Given 12/26/19 1228)  sodium chloride (PF) 0.9 % injection (has no administration in time range)  HYDROmorphone (DILAUDID) injection 0.5 mg (has no administration in time range)  lactated ringers bolus 1,000 mL (has no administration in time range)  Tbo-Filgrastim (GRANIX) injection 300 mcg (has no administration in time range)  lactated ringers bolus 2,289 mL (2,289 mLs Intravenous New Bag/Given 12/26/19 1137)  piperacillin-tazobactam (ZOSYN) IVPB 3.375 g (0 g Intravenous Stopped 12/26/19 1159)  fentaNYL (SUBLIMAZE) injection 100 mcg (100 mcg Intravenous Given 12/26/19 1128)  iohexol (OMNIPAQUE) 300 MG/ML solution 75 mL (75 mLs Intravenous Contrast Given 12/26/19 1205)    ED Course  I have reviewed the triage vital signs and the nursing notes.  Pertinent labs & imaging results that were available during my care of the patient were reviewed by me and considered in my medical decision making (see chart for details).    MDM Rules/Calculators/A&P                          73yM with abdominal pain and malaise. Clinically sepsis. Tachycardic and neutropenic. Suspect intraabdominal source with  chief complaint of abdominal pain and significant mid/L sided abdominal tenderness on exam. Empiric abx ordered.   Incarcerated ventral hernia. Strangulated?  He is clinically pretty sick. No overlying skin changes though. Lactic acid is elevated. Leukopenic/neutropenic. CT also with air in L abdominal wall. Source? Very tender on exam into L abdomen. Hx of non-small lung CA with wedge resection of L upper lobe and needed chest tube after the biopsy that preceded this. Procedures over a month ago though. I wouldn't expect  much residual air at this point. I don't think it is coming from the herniated bowel. Typically don't see that much air with subcutaneous injections and he hasn't had any recently from what he/wife remember.    Repeat lactic acid is higher despite 30cc/kg of IVF. Concerning. Awaiting to hear back from general surgery. Discussed with pharmacy. It doesn't appear he received neulasta with recent chemo. Thinks reasonable to attribute neutropenia to chemo given the agents he received. Inpatient pharmacy uses granix. Dose ordered.   Final Clinical Impression(s) / ED Diagnoses Final diagnoses:  Severe sepsis ([place])  Incarcerated ventral hernia  Urinary tract infection without hematuria, site unspecified  AKI (acute kidney injury) ([place])  Neutropenia, unspecified type [facility])    Rx / DC Orders ED Discharge Orders    None       Virgel Manifold, MD 12/27/19 [phone]

## 2019-12-26 NOTE — ED Notes (Signed)
Critical Value: Lactic Acid 2.8.   [place], EDP made aware.

## 2019-12-26 NOTE — ED Notes (Signed)
Attempted to call report. RN call time is 1728. Receiving RN will call back.

## 2019-12-26 NOTE — Progress Notes (Signed)
Pharmacy Antibiotic Note  Chad Gomez is a 74 y.o. male admitted on 12/26/2019 with sepsis, neutropenia likely due to chemotherapy, SBO.  Pharmacy has been consulted for vancomycin dosing.   Plan: Vancomycin 1500 mg iv once followed by vancomycin 1000 IV every 24 hours.  Goal trough 15-20 mcg/mL.  F/U renal function, culture results, clinical course, levels as indicated Consider low threshold for stopping vancomycin with likely abdominal source of infection  Height: 5\' 10"  (177.8 cm) Weight: 76.2 kg (168 lb 1.6 oz) IBW/kg (Calculated) : 73  Temp (24hrs), Avg:99.6 F (37.6 C), Min:99.6 F (37.6 C), Max:99.6 F (37.6 C)  Recent Labs  Lab 12/26/19 1004 12/26/19 1312  WBC 0.8*  --   CREATININE 1.76*  --   LATICACIDVEN 2.8* 3.4*    Estimated Creatinine Clearance: 39.2 mL/min (A) (by C-G formula based on SCr of 1.76 mg/dL (H)).    Allergies  Allergen Reactions  . Morphine And Related Anxiety and Other (See Comments)    Combative, incoherent    . Tape Other (See Comments) and Rash    Surgical tape Adhesive tape causes blisters  . Amoxicillin Other (See Comments)    Severe stomach pain  . Naproxen Swelling    Eyes swell   Antimicrobials this admission:  vancomycin 7/5 >>  zosyn 7/5 >> x 1  Dose adjustments this admission:    Microbiology results:  7/5 BCx: NGTD   Thank you for allowing pharmacy to be a part of this patient's care.  Ulice Dash D 12/26/2019 4:50 PM

## 2019-12-26 NOTE — H&P (Signed)
History and Physical    Chad Gomez:[id number] DOB: 01/01/1946 DOA: 12/26/2019  PCP: [name], MD  Patient coming from: home  I have personally briefly reviewed patient's old medical records in [place]  Chief Complaint: Abdominal pain  HPI: Chad Gomez is a 74 y.o. male with medical history significant of type 2 diabetes, hypertension, hyperlipidemia, COPD, chronic kidney disease, BPH, chronic indwelling Foley, peripheral vascular disease status post bypass 1 year ago, ongoing tobacco use, stage IIIa non-small cell cancer of the lung status post robotic assisted lobectomy on 5 7, undergoing chemotherapy first dose given last week.  Since that time his wife reports ongoing fatigue weakness poor appetite, decreased p.o. intake especially liquids and abdominal pain he reports some mild nausea but denies emesis. ED Course: The ED he was found to be neutropenic with a white count of 0.8, low platelets at 130, renal insufficiency with a creatinine of 1.76, blood sugar of 215, T bili elevated at 1.4.  Urinalysis revealed moderate blood 30+ protein, nitrate negative, small leuks, and many bacteria.  His lactic acid was 2.8 and then after 3 L of fluid was up to 3.4.  He denied fever or chills.  CT scan revealed partial small bowel obstruction related to a 10 cm anterior abdominal hernia.  Also of concern was subcutaneous air was identified in the right abdomen and pelvic subcutaneous fat from an unclear source.  He was given Zosyn and vancomycin for neutropenic infection, which might be originating from his urine or intra-abdominal.  General surgery was consulted who advised an NG tube but stated they would not be performing surgery given his level of illness.  Review of Systems: As per HPI otherwise 10 point review of systems negative.   Past Medical History:  Diagnosis Date  . BPH (benign prostatic hypertrophy)    weak stream  . Chronic kidney disease   . Complication of  anesthesia    urinary retention  . COPD (chronic obstructive pulmonary disease) ([place])   . Diabetes mellitus without complication ([place])    Type II  . History of bladder stone   . History of colon polyps    08/2013   pre-canerous  . History of kidney stones   . History of melanoma excision    LEFT EAR  . Hyperlipidemia   . Hypertension   . Iron deficiency   . Lung cancer, lingula ([place]) 11/01/2019  . Open wound    left lower jaw; followed by ENT  . Peripheral vascular disease ([place])   . Right ureteral stone   . Shingles 05-15-2015    Past Surgical History:  Procedure Laterality Date  . ABDOMINAL AORTAGRAM  11/01/2014   Procedure: Abdominal Aortagram;  Surgeon: Serafina Mitchell, MD;  Location: [facility];  Service: Cardiovascular;;  . AORTA - BILATERAL FEMORAL ARTERY BYPASS GRAFT N/A 12/28/2014   Procedure: AORTOBIFEMORAL BYPASS GRAFT;  Surgeon: Serafina Mitchell, MD;  Location: [facility];  Service: Vascular;  Laterality: N/A;  . BRONCHIAL BRUSHINGS  09/06/2019   Procedure: BRONCHIAL BRUSHINGS;  Surgeon: Collene Gobble, MD;  Location: [facility] ENDOSCOPY;  Service: Pulmonary;;  left upper lobe nodule  . BRONCHIAL WASHINGS  09/06/2019   Procedure: BRONCHIAL WASHINGS;  Surgeon: Collene Gobble, MD;  Location: [facility] ENDOSCOPY;  Service: Pulmonary;;  . COLONOSCOPY W/ POLYPECTOMY  09-13-2013  . CYSTOLITHALOPAXY OF BLADDER STONE/ TRANSRECTAL ULTRASOUND PROSTATE BX  08-19-2005  . CYSTOSCOPY W/ RETROGRADES Right 12/02/2012   Procedure: CYSTOSCOPY WITH RETROGRADE PYELOGRAM;  Surgeon: Malka So, MD;  Location: [facility];  Service: Urology;  Laterality: Right;  . CYSTOSCOPY W/ RETROGRADES Bilateral 10/13/2013   Procedure: CYSTOSCOPY WITH RETROGRADE PYELOGRAM;  Surgeon: Irine Seal, MD;  Location: [facility];  Service: Urology;  Laterality: Bilateral;  . CYSTOSCOPY W/ URETERAL STENT PLACEMENT Left 04/11/2014   Procedure: CYSTOSCOPY WITH STENT REPLACEMENT;  Surgeon: Malka So, MD;  Location: [facility];  Service: Urology;  Laterality: Left;  . CYSTOSCOPY WITH LITHOLAPAXY Right 12/02/2012   Procedure: CYSTOSCOPY WITH stone extraction;  Surgeon: Malka So, MD;  Location: [facility];  Service: Urology;  Laterality: Right;  . CYSTOSCOPY WITH LITHOLAPAXY Right 10/13/2013   Procedure: CYSTOSCOPY WITH LITHOLAPAXY, removal of urethral stone with basket;  Surgeon: Irine Seal, MD;  Location: [facility];  Service: Urology;  Laterality: Right;  . CYSTOSCOPY WITH RETROGRADE PYELOGRAM, URETEROSCOPY AND STENT PLACEMENT Left 03/21/2014   Procedure: CYSTOSCOPY WITH RETROGRADE PYELOGRAM, AND LEFT STENT PLACEMENT;  Surgeon: Alexis Frock, MD;  Location: [facility];  Service: Urology;  Laterality: Left;  . CYSTOSCOPY WITH RETROGRADE PYELOGRAM, URETEROSCOPY AND STENT PLACEMENT  09/09/2016   Procedure: CYSTOSCOPY WITH RIGHT  RETROGRADE PYELOGRAM, URETEROSCOPY WITH LASER BASKET EXTRACTION OF STONES  AND STENT PLACEMENT;  Surgeon: Irine Seal, MD;  Location: [facility];  Service: Urology;;  . Consuela Mimes WITH URETEROSCOPY Left 04/11/2014   Procedure: CYSTOSCOPY WITH URETEROSCOPY/ STONE EXTRACTION;  Surgeon: Malka So, MD;  Location: [facility];  Service: Urology;  Laterality: Left;  . FINE NEEDLE ASPIRATION  09/06/2019   Procedure: FINE NEEDLE ASPIRATION;  Surgeon: Collene Gobble, MD;  Location: [facility] ENDOSCOPY;  Service: Pulmonary;;  left upper lobe - lung  . HOLMIUM LASER APPLICATION Right 0/99/8338   Procedure: HOLMIUM LASER APPLICATION;  Surgeon: Malka So, MD;  Location: [facility];  Service: Urology;  Laterality: Right;  . HOLMIUM LASER APPLICATION Left 25/10/3974   Procedure: HOLMIUM LASER APPLICATION;  Surgeon: Malka So, MD;  Location: [facility];  Service: Urology;  Laterality: Left;  . HOLMIUM LASER APPLICATION Right 7/34/1937   Procedure: HOLMIUM LASER APPLICATION;   Surgeon: Irine Seal, MD;  Location: [facility];  Service: Urology;  Laterality: Right;  . INGUINAL HERNIA REPAIR Right 1954  . INTERCOSTAL NERVE BLOCK Left 10/28/2019   Procedure: Intercostal Nerve Block;  Surgeon: [name], MD;  Location: [place];  Service: Thoracic;  Laterality: Left;  . LITHOTRIPSY  2007 & 02/2013  . LUNG BIOPSY  09/06/2019   Procedure: LUNG BIOPSY;  Surgeon: Collene Gobble, MD;  Location: [name] ENDOSCOPY;  Service: Pulmonary;;  left upper lob lung  . [place]  . MICROLARYNGOSCOPY Right 02/18/2016   Procedure: MICRO DIRECT LARYNGOSCOPY EXCISIONAL BIOPSY OF RIGHT VOCAL CORD MASS;  Surgeon: Leta Baptist, MD;  Location: [place];  Service: ENT;  Laterality: Right;  . [place]   left ear  . MULTIPLE TOOTH EXTRACTIONS  1994  . NEPHROLITHOTOMY Left 01/27/2013   Procedure: NEPHROLITHOTOMY PERCUTANEOUS FIRST LOOK;  Surgeon: Malka So, MD;  Location: [facility];  Service: Urology;  Laterality: Left;  . NODE DISSECTION  10/28/2019   Procedure: Node Dissection;  Surgeon: [name], MD;  Location: [place];  Service: Thoracic;;  . PERIPHERAL VASCULAR CATHETERIZATION N/A 11/01/2014   Procedure: Lower Extremity Angiography;  Surgeon: Serafina Mitchell, MD;  Location: [place] [facility];  Service: Cardiovascular;  Laterality: N/A;  . URETEROSCOPY Right 12/02/2012   Procedure: RIGHT URETEROSCOPY STONE EXTRACTION WITH  STENT PLACEMENT;  Surgeon: Malka So, MD;  Location: [facility]  Service: Urology;  Laterality: Right;  Marland Kitchen VIDEO BRONCHOSCOPY  10/28/2019   VIDEO BRONCHOSCOPY WITH ENDOBRONCHIAL NAVIGATION WITH LUNG MARKINGS (N/A  . VIDEO BRONCHOSCOPY WITH ENDOBRONCHIAL NAVIGATION N/A 09/06/2019   Procedure: VIDEO BRONCHOSCOPY WITH ENDOBRONCHIAL NAVIGATION;  Surgeon: Collene Gobble, MD;  Location: [place] [facility];  Service: Pulmonary;  Laterality: N/A;  . VIDEO BRONCHOSCOPY WITH ENDOBRONCHIAL NAVIGATION N/A 10/28/2019     Procedure: VIDEO BRONCHOSCOPY WITH ENDOBRONCHIAL NAVIGATION WITH LUNG MARKINGS;  Surgeon: [name], MD;  Location: [place];  Service: Thoracic;  Laterality: N/A;  . WEDGE RESECTION  10/28/2019   LEFT UPPER LOBE     reports that he has been smoking cigarettes. He started smoking about 60 years ago. He has a 59.00 pack-year smoking history. He has never used smokeless tobacco. He reports that he does not drink alcohol and does not use drugs.  Allergies  Allergen Reactions  . Morphine And Related Anxiety and Other (See Comments)    Combative, incoherent    . Tape Other (See Comments) and Rash    Surgical tape Adhesive tape causes blisters  . Amoxicillin Other (See Comments)    Severe stomach pain  . Naproxen Swelling    Eyes swell    Family History  Problem Relation Age of Onset  . Dementia Mother   . Hypertension Mother   . Stroke Mother 2  . Dementia Father   . Hypertension Sister   . Colon cancer Neg Hx     Prior to Admission medications   Medication Sig Start Date End Date Taking? Authorizing Provider  acetaminophen (TYLENOL) 500 MG tablet Take 2 tablets (1,000 mg total) by mouth every 6 (six) hours as needed for mild pain. 11/05/19  Yes Barrett, Erin R, PA-C  albuterol (VENTOLIN HFA) 108 (90 Base) MCG/ACT inhaler Inhale 2 puffs into the lungs every 6 (six) hours as needed for wheezing or shortness of breath. 12/08/19  Yes [name], MD  amLODipine (NORVASC) 10 MG tablet Take 10 mg by mouth daily.   Yes [provider]  aspirin EC 81 MG tablet Take 81 mg by mouth daily.   Yes [provider]  atorvastatin (LIPITOR) 40 MG tablet Take 40 mg by mouth daily. 07/15/19  Yes [provider]  cetirizine (ZYRTEC) 10 MG tablet Take 10 mg by mouth daily.   Yes [provider]  Cholecalciferol (VITAMIN D-3) 1000 units CAPS Take 1,000 Units by mouth daily.    Yes [provider]  dextromethorphan-guaiFENesin (MUCINEX DM) 30-600  MG 12hr tablet Take 1 tablet by mouth 2 (two) times daily as needed for cough. 11/04/19  Yes Barrett, Erin R, PA-C  ezetimibe (ZETIA) 10 MG tablet Take 5 mg by mouth daily.   Yes [provider]  finasteride (PROSCAR) 5 MG tablet Take 5 mg by mouth daily. 04/25/19  Yes [provider]  fluticasone (FLONASE) 50 MCG/ACT nasal spray Place 2 sprays into both nostrils daily.  08/09/19  Yes [provider]  indapamide (LOZOL) 1.25 MG tablet Take 1.25 mg by mouth daily.   Yes [provider]  magnesium oxide (MAG-OX) 400 (241.3 Mg) MG tablet Take 1 tablet (400 mg total) by mouth daily. 12/15/19  Yes Heilingoetter, Cassandra L, PA-C  metFORMIN (GLUCOPHAGE-XR) 500 MG 24 hr tablet Take 500-1,000  mg by mouth See admin instructions. Take 500 mg every morning and take 1000 mg at supper 04/25/19  Yes [provider]  potassium citrate (UROCIT-K) 10 MEQ (1080 MG) SR tablet Take 10 mEq by mouth in the morning and at bedtime.    Yes [provider]  prochlorperazine (COMPAZINE) 10 MG tablet Take 1 tablet (10 mg total) by mouth every 6 (six) hours as needed. 11/30/19  Yes Heilingoetter, Cassandra L, PA-C  tamsulosin (FLOMAX) 0.4 MG CAPS capsule Take 0.4 mg by mouth daily after supper.  08/05/19  Yes [provider]  Tiotropium Bromide-Olodaterol (STIOLTO RESPIMAT) 2.5-2.5 MCG/ACT AERS Inhale 2 puffs into the lungs daily. 09/25/17  Yes Juanito Doom, MD  traMADol (ULTRAM) 50 MG tablet Take 1 tablet (50 mg total) by mouth every 6 (six) hours as needed (mild pain). 12/08/19  Yes [name], MD    Physical Exam: Vitals:   12/26/19 1003 12/26/19 1006 12/26/19 1015 12/26/19 1100  BP:  117/78 112/78 111/76  Pulse:  (!) 120 (!) 120 (!) 115  Resp:  (!) 30 (!) 29 (!) 31  Temp:      SpO2:  95% 96% 94%  Weight: 76.2 kg     Height: 5\' 10"  (1.778 m)       Constitutional: NAD, calm, comfortable, patient is sleeping and most of the history is obtained from his  wife Eyes: PERRL, lids and conjunctivae normal ENMT: Mucous membranes are moist. Posterior pharynx clear of any exudate or lesions.Normal dentition.  Neck: normal, supple, no masses, no thyromegaly Respiratory: clear to auscultation bilaterally, no wheezing, no crackles. Normal respiratory effort. No accessory muscle use.  Cardiovascular: Regular rate and rhythm, no murmurs / rubs / gallops. No extremity edema. 2+ pedal pulses. No carotid bruits.  Abdomen: Hypoactive bowel sounds, abdomen is soft, there is a very large left of midline hernia with soft bowel in it.  There may be a smaller umbilical hernia. Musculoskeletal: no clubbing / cyanosis. No joint deformity upper and lower extremities. Good ROM, no contractures. Normal muscle tone.  Skin: no rashes, lesions, ulcers. No induration Neurologic:  grossly intact.   Labs on Admission: I have personally reviewed following labs and imaging studies  CBC: Recent Labs  Lab 12/26/19 1004  WBC 0.8*  NEUTROABS 0.4*  HGB 17.5*  HCT 50.9  MCV 90.1  PLT 347*   Basic Metabolic Panel: Recent Labs  Lab 12/26/19 1004  NA 136  K 4.2  CL 91*  CO2 28  GLUCOSE 215*  BUN 47*  CREATININE 1.76*  CALCIUM 10.5*   GFR: Estimated Creatinine Clearance: 39.2 mL/min (A) (by C-G formula based on SCr of 1.76 mg/dL (H)). Liver Function Tests: Recent Labs  Lab 12/26/19 1004  AST 23  ALT 31  ALKPHOS 71  BILITOT 1.4*  PROT 7.1  ALBUMIN 4.0   Recent Labs  Lab 12/26/19 1004  LIPASE 18   Coagulation Profile: Recent Labs  Lab 12/26/19 1004  INR 1.1   Urine analysis:    Component Value Date/Time   COLORURINE AMBER (A) 12/26/2019 1131   APPEARANCEUR HAZY (A) 12/26/2019 1131   LABSPEC 1.018 12/26/2019 1131   PHURINE 5.0 12/26/2019 1131   GLUCOSEU NEGATIVE 12/26/2019 1131   HGBUR MODERATE (A) 12/26/2019 1131   BILIRUBINUR NEGATIVE 12/26/2019 1131   KETONESUR 20 (A) 12/26/2019 1131   PROTEINUR 30 (A) 12/26/2019 1131   UROBILINOGEN 1.0  01/19/2015 1312   NITRITE NEGATIVE 12/26/2019 1131   LEUKOCYTESUR SMALL (A) 12/26/2019 1131  Radiological Exams on Admission: DG Chest 2 View  Result Date: 12/26/2019 CLINICAL DATA:  Congestion and cough this week. EXAM: CHEST - 2 VIEW COMPARISON:  December 08, 2019 FINDINGS: The heart size and mediastinal contours are within normal limits. Both lungs are clear. The visualized skeletal structures are unremarkable. IMPRESSION: No active cardiopulmonary disease. Electronically Signed   By: Abelardo Diesel M.D.   On: 12/26/2019 11:04   CT ABDOMEN PELVIS W CONTRAST  Result Date: 12/26/2019 CLINICAL DATA:  Acute abdomen pain.  History of lung cancer. EXAM: CT ABDOMEN AND PELVIS WITH CONTRAST TECHNIQUE: Multidetector CT imaging of the abdomen and pelvis was performed using the standard protocol following bolus administration of intravenous contrast. CONTRAST:  64mL OMNIPAQUE IOHEXOL 300 MG/ML  SOLN COMPARISON:  April 05, 2019 FINDINGS: Lower chest: Mild dependent atelectasis of right lung base is identified. The heart size is normal. Hepatobiliary: Stable enhancing low-density lesion is identified in the posterior segment right lobe liver unchanged compared prior CT. The liver is otherwise. Gallstones in gallbladder. No inflammation is noted surrounding the gallbladder. The biliary tree is normal. Pancreas: Unremarkable. No pancreatic ductal dilatation or surrounding inflammatory changes. Spleen: Normal in size without focal abnormality. Adrenals/Urinary Tract: 1 cm nodule is identified in the left adrenal gland unchanged. The right adrenal gland is normal. There is atrophy of the right kidney unchanged. There left kidney cyst. There is no hydronephrosis bilaterally. Foley catheter is identified in a decompressed bladder. Stomach/Bowel: There are multiple thick wall mild dilated small bowel loops in the mid and lower abdomen probably as a result of partial bowel obstruction due to herniated small bowel loops in  the midline 10 cm anterior abdominal hernia. Colon is normal. The appendix is not seen but no inflammation is noted around cecum. Vascular/Lymphatic: Aortic atherosclerosis. No enlarged abdominal or pelvic lymph nodes. Reproductive: Enlarged prostate with calcifications are identified. Other: Midline anterior abdominal hernia containing small bowel are identified as previously described. Subcutaneous air is identified in the anterior right abdominal and pelvic subcutaneous fat, source indeterminate. Clinical correlation regarding recent injection in the area is suggested. There is no free air in the abdomen and pelvis. Musculoskeletal: Degenerative joint changes of the spine are noted. IMPRESSION: 1. Multiple thick wall mild dilated small bowel loops in the mid and lower abdomen probably as a result of partial bowel obstruction due to herniated small bowel loops in the midline anterior abdominal hernia. 2. Subcutaneous air is identified in the anterior right abdominal and pelvic subcutaneous fat, source indeterminate. Clinical correlation regarding recent injection in the area is suggested. There is no free air in the abdomen and pelvis. 3. Aortic atherosclerosis. These results were called by telephone at the time of interpretation on 12/26/2019 at 12:57 pm to provider [facility] , who verbally acknowledged these results. Aortic Atherosclerosis (ICD10-I70.0). Electronically Signed   By: Abelardo Diesel M.D.   On: 12/26/2019 13:01    Assessment/Plan Principal Problem:   SBO (small bowel obstruction) (HCC) Active Problems:   Atherosclerosis of native artery of both lower extremities with intermittent claudication (HCC)   S/P aortobifemoral bypass surgery   Centrilobular emphysema (HCC)   Stage 4 very severe COPD by GOLD classification ([place])   Tobacco abuse   Hypertension   Dyslipidemia   Type 2 diabetes mellitus with complication, without long-term current use of insulin (HCC)   Lung cancer, lingula  (HCC)   Leukopenia due to antineoplastic chemotherapy ([place])   Sepsis ([place])   Ventral hernia   Dehydration  Lactic acidosis   Chronic indwelling Foley catheter   Acute lower UTI  Partial small bowel obstruction Patient is quite tachycardic on admission owing to significant dehydration.  NG has put out quite a lot of bilious material Awaiting general surgery consult for further recommendations NG to low intermittent wall suction  Sepsis in the setting of neutropenia from chemotherapy Given unclear etiology as well as neutropenia, will dose with cefepime plus Vanco and have added Flagyl in case there is anything intra-abdominal happening. Urine and blood cultures pending  Acute renal failure Baseline creatinine is 1.0 in March of this year IV hydration Avoid nephrotoxic agents  Peripheral vascular disease Status post bypass approximately 1 year ago.  This incision is there is reason for the hernia  COPD stage IV Continue albuterol Replace Stiolto with Incruse Ellipta and Brovana  Ongoing tobacco use 21 mg nicotine patch placed  Hypertension Continue amlodipine  Type 2 diabetes Continue Metformin  Lung cancer of the lingula stage IIIa T2aN2 Next treatment is not due for 2 weeks. Clear chest x-ray, no sign of recurrence  Large ventral hernia Per general surgery  Dehydration Given his rising lactic acid, poor p.o. intake have given another liter fluid bolus plus ongoing IV fluid repletion.  Chronic indwelling Foley At risk for UTI On broad-spectrum antibiotics Urine culture pending Last changed 2 months ago  Neutropenia Given Granix in the ED  Thrombocytopenia Related to ongoing chemotherapy, may improve with Granix  DVT prophylaxis: Lovenox SQ Code Status: Full code initially however they do not want any prolonged life saving treatments should that be deemed futile Family Communication: wife at bedside  Disposition Plan: home  Consults called: General  Surgery Admission status: Inpatient   Donnamae Jude MD [facility]  If 7PM-7AM, please contact night-coverage 12/26/2019, 4:36 PM

## 2019-12-26 NOTE — Consult Note (Signed)
CC: abd pain  Requesting provider: Dr Wilson Singer  HPI: Chad Gomez is an 74 y.o. male who is here for abd pain, fatigue and LOA  X 3-4 days.  Currently getting chemo for lung cancer.  Pt with CKD and indwelling catheter.  No vomiting or diarrhea.  CT shows thickened small bowel with mild obstructive pattern.  Past Medical History:  Diagnosis Date   BPH (benign prostatic hypertrophy)    weak stream   Chronic kidney disease    Complication of anesthesia    urinary retention   COPD (chronic obstructive pulmonary disease) (HCC)    Diabetes mellitus without complication (HCC)    Type II   History of bladder stone    History of colon polyps    08/2013   pre-canerous   History of kidney stones    History of melanoma excision    LEFT EAR   Hyperlipidemia    Hypertension    Iron deficiency    Lung cancer, lingula ([place]) 11/01/2019   Open wound    left lower jaw; followed by ENT   Peripheral vascular disease ([place])    Right ureteral stone    Shingles 05-15-2015    Past Surgical History:  Procedure Laterality Date   ABDOMINAL AORTAGRAM  11/01/2014   Procedure: Abdominal Aortagram;  Surgeon: Serafina Mitchell, MD;  Location: [place] [facility];  Service: Cardiovascular;;   AORTA - BILATERAL FEMORAL ARTERY BYPASS GRAFT N/A 12/28/2014   Procedure: AORTOBIFEMORAL BYPASS GRAFT;  Surgeon: Serafina Mitchell, MD;  Location: [place];  Service: Vascular;  Laterality: N/A;   BRONCHIAL BRUSHINGS  09/06/2019   Procedure: BRONCHIAL BRUSHINGS;  Surgeon: Collene Gobble, MD;  Location[facility];  Service: Pulmonary;;  left upper lobe nodule   BRONCHIAL WASHINGS  09/06/2019   Procedure: BRONCHIAL WASHINGS;  Surgeon: Collene Gobble, MD;  [facility];  Service: Pulmonary;;   COLONOSCOPY W/ POLYPECTOMY  09-13-2013   CYSTOLITHALOPAXY OF BLADDER STONE/ TRANSRECTAL ULTRASOUND PROSTATE BX  08-19-2005   CYSTOSCOPY W/ RETROGRADES Right 12/02/2012   Procedure: CYSTOSCOPY WITH RETROGRADE PYELOGRAM;   Surgeon: Malka So, MD;  Location: [facility];  Service: Urology;  Laterality: Right;   CYSTOSCOPY W/ RETROGRADES Bilateral 10/13/2013   Procedure: CYSTOSCOPY WITH RETROGRADE PYELOGRAM;  Surgeon: Irine Seal, MD;  Location: [facility];  Service: Urology;  Laterality: Bilateral;   CYSTOSCOPY W/ URETERAL STENT PLACEMENT Left 04/11/2014   Procedure: CYSTOSCOPY WITH STENT REPLACEMENT;  Surgeon: Malka So, MD;  Location: [facility];  Service: Urology;  Laterality: Left;   CYSTOSCOPY WITH LITHOLAPAXY Right 12/02/2012   Procedure: CYSTOSCOPY WITH stone extraction;  Surgeon: Malka So, MD;  Location: [facility];  Service: Urology;  Laterality: Right;   CYSTOSCOPY WITH LITHOLAPAXY Right 10/13/2013   Procedure: CYSTOSCOPY WITH LITHOLAPAXY, removal of urethral stone with basket;  Surgeon: Irine Seal, MD;  [facility];  Service: Urology;  Laterality: Right;   CYSTOSCOPY WITH RETROGRADE PYELOGRAM, URETEROSCOPY AND STENT PLACEMENT Left 03/21/2014   Procedure: CYSTOSCOPY WITH RETROGRADE PYELOGRAM, AND LEFT STENT PLACEMENT;  Surgeon: Alexis Frock, MD;  [facility];  Service: Urology;  Laterality: Left;   CYSTOSCOPY WITH RETROGRADE PYELOGRAM, URETEROSCOPY AND STENT PLACEMENT  09/09/2016   Procedure: CYSTOSCOPY WITH RIGHT  RETROGRADE PYELOGRAM, URETEROSCOPY WITH LASER BASKET EXTRACTION OF STONES  AND STENT PLACEMENT;  Surgeon: Irine Seal, MD;  Location: [place];  Service: Urology;;   CYSTOSCOPY WITH URETEROSCOPY Left  04/11/2014   Procedure: CYSTOSCOPY WITH URETEROSCOPY/ STONE EXTRACTION;  Surgeon: Malka So, MD;  Location: [facility];  Service: Urology;  Laterality: Left;   FINE NEEDLE ASPIRATION  09/06/2019   Procedure: FINE NEEDLE ASPIRATION;  Surgeon: Collene Gobble, MD;  Location: [facility] ENDOSCOPY;  Service: Pulmonary;;  left upper lobe - lung   HOLMIUM LASER APPLICATION Right  4/81/8563   Procedure: HOLMIUM LASER APPLICATION;  Surgeon: Malka So, MD;  Location: [facility];  Service: Urology;  Laterality: Right;   HOLMIUM LASER APPLICATION Left 14/97/0263   Procedure: HOLMIUM LASER APPLICATION;  Surgeon: Malka So, MD;  Location: [facility];  Service: Urology;  Laterality: Left;   HOLMIUM LASER APPLICATION Right 7/85/8850   Procedure: HOLMIUM LASER APPLICATION;  Surgeon: Irine Seal, MD;  Location: [facility];  Service: Urology;  Laterality: Right;   INGUINAL HERNIA REPAIR Right 1954   INTERCOSTAL NERVE BLOCK Left 10/28/2019   Procedure: Intercostal Nerve Block;  Surgeon: [name], MD;  Location: [place];  Service: Thoracic;  Laterality: Left;   LITHOTRIPSY  2007 & 02/2013   LUNG BIOPSY  09/06/2019   Procedure: LUNG BIOPSY;  Surgeon: Collene Gobble, MD;  Location: [facility] ENDOSCOPY;  Service: Pulmonary;;  left upper lob lung   MANDIBLE FRACTURE SURGERY  1995   MICROLARYNGOSCOPY Right 02/18/2016   Procedure: MICRO DIRECT LARYNGOSCOPY EXCISIONAL BIOPSY OF RIGHT VOCAL CORD MASS;  Surgeon: Leta Baptist, MD;  Location: [place];  Service: ENT;  Laterality: Right;   [place]   left ear   MULTIPLE TOOTH Reform   NEPHROLITHOTOMY Left 01/27/2013   Procedure: NEPHROLITHOTOMY PERCUTANEOUS FIRST LOOK;  Surgeon: Malka So, MD;  [facility];  Service: Urology;  Laterality: Left;   NODE DISSECTION  10/28/2019   Procedure: Node Dissection;  Surgeon: [name], MD;  Location: [place];  Service: Thoracic;;   PERIPHERAL VASCULAR CATHETERIZATION N/A 11/01/2014   Procedure: Lower Extremity Angiography;  Surgeon: Serafina Mitchell, MD;  Location: [place] [facility];  Service: Cardiovascular;  Laterality: N/A;   URETEROSCOPY Right 12/02/2012   Procedure: RIGHT URETEROSCOPY STONE EXTRACTION WITH  STENT PLACEMENT;  Surgeon: Malka So, MD;  Location: [facility];  Service: Urology;   Laterality: Right;   VIDEO BRONCHOSCOPY  10/28/2019   VIDEO BRONCHOSCOPY WITH ENDOBRONCHIAL NAVIGATION WITH LUNG MARKINGS (N/A   VIDEO BRONCHOSCOPY WITH ENDOBRONCHIAL NAVIGATION N/A 09/06/2019   Procedure: VIDEO BRONCHOSCOPY WITH ENDOBRONCHIAL NAVIGATION;  Surgeon: Collene Gobble, MD;  Location: [place] [facility];  Service: Pulmonary;  Laterality: N/A;   VIDEO BRONCHOSCOPY WITH ENDOBRONCHIAL NAVIGATION N/A 10/28/2019   Procedure: VIDEO BRONCHOSCOPY WITH ENDOBRONCHIAL NAVIGATION WITH LUNG MARKINGS;  Surgeon: [name], MD;  Location: [facility];  Service: Thoracic;  Laterality: N/A;   WEDGE RESECTION  10/28/2019   LEFT UPPER LOBE    Family History  Problem Relation Age of Onset   Dementia Mother    Hypertension Mother    Stroke Mother 12   Dementia Father    Hypertension Sister    Colon cancer Neg Hx     Social:  reports that he has been smoking cigarettes. He started smoking about 60 years ago. He has a 59.00 pack-year smoking history. He has never used smokeless tobacco. He reports that he does not drink alcohol and does not use drugs.  Allergies:  Allergies  Allergen Reactions   Morphine And Related Anxiety and  Other (See Comments)    Combative, incoherent     Tape Other (See Comments) and Rash    Surgical tape Adhesive tape causes blisters   Amoxicillin Other (See Comments)    Severe stomach pain   Naproxen Swelling    Eyes swell    Medications: I have reviewed the patient's current medications.  Results for orders placed or performed during the hospital encounter of 12/26/19 (from the past 48 hour(s))  Comprehensive metabolic panel     Status: Abnormal   Collection Time: 12/26/19 10:04 AM  Result Value Ref Range   Sodium 136 135 - 145 mmol/L   Potassium 4.2 3.5 - 5.1 mmol/L   Chloride 91 (L) 98 - 111 mmol/L   CO2 28 22 - 32 mmol/L   Glucose, Bld 215 (H) 70 - 99 mg/dL    Comment: Glucose reference range applies only to samples taken after fasting for at least 8 hours.     BUN 47 (H) 8 - 23 mg/dL   Creatinine, Ser 1.76 (H) 0.61 - 1.24 mg/dL   Calcium 10.5 (H) 8.9 - 10.3 mg/dL   Total Protein 7.1 6.5 - 8.1 g/dL   Albumin 4.0 3.5 - 5.0 g/dL   AST 23 15 - 41 U/L   ALT 31 0 - 44 U/L   Alkaline Phosphatase 71 38 - 126 U/L   Total Bilirubin 1.4 (H) 0.3 - 1.2 mg/dL   GFR calc non Af Amer 38 (L) >60 mL/min   GFR calc Af Amer 44 (L) >60 mL/min   Anion gap 17 (H) 5 - 15    Comment: Performed at [facility], [place] [address] [place], [place] [address]  Lactic acid, plasma     Status: Abnormal   Collection Time: 12/26/19 10:04 AM  Result Value Ref Range   Lactic Acid, Venous 2.8 (HH) 0.5 - 1.9 mmol/L    Comment: CRITICAL RESULT CALLED TO, READ BACK BY AND VERIFIED WITH: VAUGHN,L. RN AT 1100 12/26/19 MULLINS,T  Performed at [facility], [place] [address] [place], [place] [address]   CBC with Differential     Status: Abnormal   Collection Time: 12/26/19 10:04 AM  Result Value Ref Range   WBC 0.8 (LL) 4.0 - 10.5 K/uL    Comment: REPEATED TO VERIFY THIS CRITICAL RESULT HAS VERIFIED AND BEEN CALLED TO BINGHAM,S. RN BY NICOLE MCCOY ON 07 05 2021 AT 1035, AND HAS BEEN READ BACK. CRITICAL RESULT VERIFIED    RBC 5.65 4.22 - 5.81 MIL/uL   Hemoglobin 17.5 (H) 13.0 - 17.0 g/dL   HCT 50.9 39 - 52 %   MCV 90.1 80.0 - 100.0 fL   MCH 31.0 26.0 - 34.0 pg   MCHC 34.4 30.0 - 36.0 g/dL   RDW 13.0 11.5 - 15.5 %   Platelets 130 (L) 150 - 400 K/uL   nRBC 0.0 0.0 - 0.2 %   Neutrophils Relative % 49 %   Neutro Abs 0.4 (L) 1.7 - 7.7 K/uL   Band Neutrophils 4 %   Lymphocytes Relative 35 %   Lymphs Abs 0.3 (L) 0.7 - 4.0 K/uL   Monocytes Relative 7 %   Monocytes Absolute 0.1 0 - 1 K/uL   Eosinophils Relative 4 %   Eosinophils Absolute 0.0 0 - 0 K/uL   Basophils Relative 0 %   Basophils Absolute 0.0 0 - 0 K/uL   WBC Morphology MILD LEFT SHIFT (1-5% METAS, OCC MYELO, OCC BANDS)  Comment: TOXIC GRANULATION   Myelocytes 1 %   Abs  Immature Granulocytes 0.00 0.00 - 0.07 K/uL    Comment: Performed at [facility], [place] [address] [place], [place] [address]  Protime-INR     Status: None   Collection Time: 12/26/19 10:04 AM  Result Value Ref Range   Prothrombin Time 13.8 11.4 - 15.2 seconds   INR 1.1 0.8 - 1.2    Comment: (NOTE) INR goal varies based on device and disease states. Performed at [facility], [place] [address], [place], [place] [address]   Lipase, blood     Status: None   Collection Time: 12/26/19 10:04 AM  Result Value Ref Range   Lipase 18 11 - 51 U/L    Comment: Performed at [facility], [place] [address] [place], [place] [address]  Urinalysis, Routine w reflex microscopic     Status: Abnormal   Collection Time: 12/26/19 11:31 AM  Result Value Ref Range   Color, Urine AMBER (A) YELLOW    Comment: BIOCHEMICALS MAY BE AFFECTED BY COLOR   APPearance HAZY (A) CLEAR   Specific Gravity, Urine 1.018 1.005 - 1.030   pH 5.0 5.0 - 8.0   Glucose, UA NEGATIVE NEGATIVE mg/dL   Hgb urine dipstick MODERATE (A) NEGATIVE   Bilirubin Urine NEGATIVE NEGATIVE   Ketones, ur 20 (A) NEGATIVE mg/dL   Protein, ur 30 (A) NEGATIVE mg/dL   Nitrite NEGATIVE NEGATIVE   Leukocytes,Ua SMALL (A) NEGATIVE   RBC / HPF 0-5 0 - 5 RBC/hpf   WBC, UA 6-10 0 - 5 WBC/hpf   Bacteria, UA MANY (A) NONE SEEN   Mucus PRESENT     Comment: Performed at [facility], [place] [address], [place],  [address]  SARS Coronavirus 2 by RT PCR (hospital order, performed in [facility] hospital lab) Nasopharyngeal Nasopharyngeal Swab     Status: None   Collection Time: 12/26/19 12:34 PM   Specimen: Nasopharyngeal Swab  Result Value Ref Range   SARS Coronavirus 2 NEGATIVE NEGATIVE    Comment: (NOTE) SARS-CoV-2 target nucleic acids are NOT DETECTED.  The SARS-CoV-2 RNA is generally detectable in upper and lower respiratory specimens during the acute phase of infection.  The lowest concentration of SARS-CoV-2 viral copies this assay can detect is 250 copies / mL. A negative result does not preclude SARS-CoV-2 infection and should not be used as the sole basis for treatment or other patient management decisions.  A negative result may occur with improper specimen collection / handling, submission of specimen other than nasopharyngeal swab, presence of viral mutation(s) within the areas targeted by this assay, and inadequate number of viral copies (<250 copies / mL). A negative result must be combined with clinical observations, patient history, and epidemiological information.  Fact Sheet for Patients:   StrictlyIdeas.no  Fact Sheet for Healthcare Providers: BankingDealers.co.za  This test is not yet approved or  cleared by the [place] FDA and has been authorized for detection and/or diagnosis of SARS-CoV-2 by FDA under an Emergency Use Authorization (EUA).  This EUA will remain in effect (meaning this test can be used) for the duration of the COVID-19 declaration under Section 564(b)(1) of the Act, 21 U.S.C. section 360bbb-3(b)(1), unless the authorization is terminated or revoked sooner.  Performed at [facility], [place] [address], [place], [place] [address]   Lactic acid, plasma     Status: Abnormal   Collection Time: 12/26/19  1:12 PM  Result Value Ref Range   Lactic Acid, Venous 3.4 (HH) 0.5 - 1.9 mmol/L    Comment: CRITICAL VALUE NOTED.  VALUE IS CONSISTENT WITH PREVIOUSLY REPORTED AND CALLED VALUE. Performed at [facility], [place] [address], [place], [place] [address]     DG Chest 2 View  Result Date: 12/26/2019 CLINICAL DATA:  Congestion and cough this week. EXAM: CHEST - 2 VIEW COMPARISON:  December 08, 2019 FINDINGS: The heart size and mediastinal contours are within normal limits. Both lungs are clear. The visualized skeletal structures are  unremarkable. IMPRESSION: No active cardiopulmonary disease. Electronically Signed   By: Abelardo Diesel M.D.   On: 12/26/2019 11:04   CT ABDOMEN PELVIS W CONTRAST  Result Date: 12/26/2019 CLINICAL DATA:  Acute abdomen pain.  History of lung cancer. EXAM: CT ABDOMEN AND PELVIS WITH CONTRAST TECHNIQUE: Multidetector CT imaging of the abdomen and pelvis was performed using the standard protocol following bolus administration of intravenous contrast. CONTRAST:  65mL OMNIPAQUE IOHEXOL 300 MG/ML  SOLN COMPARISON:  April 05, 2019 FINDINGS: Lower chest: Mild dependent atelectasis of right lung base is identified. The heart size is normal. Hepatobiliary: Stable enhancing low-density lesion is identified in the posterior segment right lobe liver unchanged compared prior CT. The liver is otherwise. Gallstones in gallbladder. No inflammation is noted surrounding the gallbladder. The biliary tree is normal. Pancreas: Unremarkable. No pancreatic ductal dilatation or surrounding inflammatory changes. Spleen: Normal in size without focal abnormality. Adrenals/Urinary Tract: 1 cm nodule is identified in the left adrenal gland unchanged. The right adrenal gland is normal. There is atrophy of the right kidney unchanged. There left kidney cyst. There is no hydronephrosis bilaterally. Foley catheter is identified in a decompressed bladder. Stomach/Bowel: There are multiple thick wall mild dilated small bowel loops in the mid and lower abdomen probably as a result of partial bowel obstruction due to herniated small bowel loops in the midline 10 cm anterior abdominal hernia. Colon is normal. The appendix is not seen but no inflammation is noted around cecum. Vascular/Lymphatic: Aortic atherosclerosis. No enlarged abdominal or pelvic lymph nodes. Reproductive: Enlarged prostate with calcifications are identified. Other: Midline anterior abdominal hernia containing small bowel are identified as previously described. Subcutaneous air is  identified in the anterior right abdominal and pelvic subcutaneous fat, source indeterminate. Clinical correlation regarding recent injection in the area is suggested. There is no free air in the abdomen and pelvis. Musculoskeletal: Degenerative joint changes of the spine are noted. IMPRESSION: 1. Multiple thick wall mild dilated small bowel loops in the mid and lower abdomen probably as a result of partial bowel obstruction due to herniated small bowel loops in the midline anterior abdominal hernia. 2. Subcutaneous air is identified in the anterior right abdominal and pelvic subcutaneous fat, source indeterminate. Clinical correlation regarding recent injection in the area is suggested. There is no free air in the abdomen and pelvis. 3. Aortic atherosclerosis. These results were called by telephone at the time of interpretation on 12/26/2019 at 12:57 pm to provider [facility] , who verbally acknowledged these results. Aortic Atherosclerosis (ICD10-I70.0). Electronically Signed   By: Abelardo Diesel M.D.   On: 12/26/2019 13:01    ROS - all of the below systems have been reviewed with the patient and positives are indicated with bold text General: chills, fever or night sweats Eyes: blurry vision or double vision ENT: epistaxis or sore throat Allergy/Immunology: itchy/watery eyes or nasal congestion Hematologic/Lymphatic: bleeding problems, blood clots or swollen lymph nodes Endocrine: temperature intolerance  or unexpected weight changes Breast: new or changing breast lumps or nipple discharge Resp: cough, shortness of breath, or wheezing CV: chest pain or dyspnea on exertion GI: as per HPI GU: dysuria, trouble voiding, or hematuria MSK: joint pain or joint stiffness Neuro: TIA or stroke symptoms Derm: pruritus and skin lesion changes Psych: anxiety and depression  PE Blood pressure 111/76, pulse (!) 115, temperature 99.6 F (37.6 C), resp. rate (!) 31, height 5\' 10"  (1.778 m), weight 76.2 kg,  SpO2 94 %. Constitutional: NAD; conversant; no deformities Eyes: Moist conjunctiva; no lid lag; anicteric; PERRL Neck: Trachea midline; no thyromegaly Lungs: Normal respiratory effort; no tactile fremitus CV: RRR; no palpable thrills; no pitting edema GI: Abd soft, distended, hernia at midline is reducible; no palpable hepatosplenomegaly MSK: Normal range of motion of extremities; no clubbing/cyanosis Psychiatric: Appropriate affect; alert and oriented x3 Lymphatic: No palpable cervical or axillary lymphadenopathy  Results for orders placed or performed during the hospital encounter of 12/26/19 (from the past 48 hour(s))  Comprehensive metabolic panel     Status: Abnormal   Collection Time: 12/26/19 10:04 AM  Result Value Ref Range   Sodium 136 135 - 145 mmol/L   Potassium 4.2 3.5 - 5.1 mmol/L   Chloride 91 (L) 98 - 111 mmol/L   CO2 28 22 - 32 mmol/L   Glucose, Bld 215 (H) 70 - 99 mg/dL    Comment: Glucose reference range applies only to samples taken after fasting for at least 8 hours.   BUN 47 (H) 8 - 23 mg/dL   Creatinine, Ser 1.76 (H) 0.61 - 1.24 mg/dL   Calcium 10.5 (H) 8.9 - 10.3 mg/dL   Total Protein 7.1 6.5 - 8.1 g/dL   Albumin 4.0 3.5 - 5.0 g/dL   AST 23 15 - 41 U/L   ALT 31 0 - 44 U/L   Alkaline Phosphatase 71 38 - 126 U/L   Total Bilirubin 1.4 (H) 0.3 - 1.2 mg/dL   GFR calc non Af Amer 38 (L) >60 mL/min   GFR calc Af Amer 44 (L) >60 mL/min   Anion gap 17 (H) 5 - 15    Comment: Performed at [facility], [place] [address], [place], [place] [address]  Lactic acid, plasma     Status: Abnormal   Collection Time: 12/26/19 10:04 AM  Result Value Ref Range   Lactic Acid, Venous 2.8 (HH) 0.5 - 1.9 mmol/L    Comment: CRITICAL RESULT CALLED TO, READ BACK BY AND VERIFIED WITH: VAUGHN,L. RN AT 1100 12/26/19 MULLINS,T  Performed at [facility], [place] [address], [place], [place] [address]   CBC with Differential     Status: Abnormal    Collection Time: 12/26/19 10:04 AM  Result Value Ref Range   WBC 0.8 (LL) 4.0 - 10.5 K/uL    Comment: REPEATED TO VERIFY THIS CRITICAL RESULT HAS VERIFIED AND BEEN CALLED TO BINGHAM,S. RN BY NICOLE MCCOY ON 07 05 2021 AT 1035, AND HAS BEEN READ BACK. CRITICAL RESULT VERIFIED    RBC 5.65 4.22 - 5.81 MIL/uL   Hemoglobin 17.5 (H) 13.0 - 17.0 g/dL   HCT 50.9 39 - 52 %   MCV 90.1 80.0 - 100.0 fL   MCH 31.0 26.0 - 34.0 pg   MCHC 34.4 30.0 - 36.0 g/dL   RDW 13.0 11.5 - 15.5 %   Platelets 130 (L) 150 - 400 K/uL   nRBC 0.0 0.0 - 0.2 %   Neutrophils Relative % 49 %  Neutro Abs 0.4 (L) 1.7 - 7.7 K/uL   Band Neutrophils 4 %   Lymphocytes Relative 35 %   Lymphs Abs 0.3 (L) 0.7 - 4.0 K/uL   Monocytes Relative 7 %   Monocytes Absolute 0.1 0 - 1 K/uL   Eosinophils Relative 4 %   Eosinophils Absolute 0.0 0 - 0 K/uL   Basophils Relative 0 %   Basophils Absolute 0.0 0 - 0 K/uL   WBC Morphology MILD LEFT SHIFT (1-5% METAS, OCC MYELO, OCC BANDS)     Comment: TOXIC GRANULATION   Myelocytes 1 %   Abs Immature Granulocytes 0.00 0.00 - 0.07 K/uL    Comment: Performed at [facility], [place] [address] [place], [place] [address]  Protime-INR     Status: None   Collection Time: 12/26/19 10:04 AM  Result Value Ref Range   Prothrombin Time 13.8 11.4 - 15.2 seconds   INR 1.1 0.8 - 1.2    Comment: (NOTE) INR goal varies based on device and disease states. Performed at [facility], [place] [address] [place], [place] [address]   Lipase, blood     Status: None   Collection Time: 12/26/19 10:04 AM  Result Value Ref Range   Lipase 18 11 - 51 U/L    Comment: Performed at [facility], [place] [address], [place], [place] [address]  Urinalysis, Routine w reflex microscopic     Status: Abnormal   Collection Time: 12/26/19 11:31 AM  Result Value Ref Range   Color, Urine AMBER (A) YELLOW    Comment: BIOCHEMICALS MAY BE AFFECTED BY COLOR   APPearance HAZY  (A) CLEAR   Specific Gravity, Urine 1.018 1.005 - 1.030   pH 5.0 5.0 - 8.0   Glucose, UA NEGATIVE NEGATIVE mg/dL   Hgb urine dipstick MODERATE (A) NEGATIVE   Bilirubin Urine NEGATIVE NEGATIVE   Ketones, ur 20 (A) NEGATIVE mg/dL   Protein, ur 30 (A) NEGATIVE mg/dL   Nitrite NEGATIVE NEGATIVE   Leukocytes,Ua SMALL (A) NEGATIVE   RBC / HPF 0-5 0 - 5 RBC/hpf   WBC, UA 6-10 0 - 5 WBC/hpf   Bacteria, UA MANY (A) NONE SEEN   Mucus PRESENT     Comment: Performed at [facility], [place] [address] [place], [place] [address]  SARS Coronavirus 2 by RT PCR (hospital order, performed in [facility] hospital lab) Nasopharyngeal Nasopharyngeal Swab     Status: None   Collection Time: 12/26/19 12:34 PM   Specimen: Nasopharyngeal Swab  Result Value Ref Range   SARS Coronavirus 2 NEGATIVE NEGATIVE    Comment: (NOTE) SARS-CoV-2 target nucleic acids are NOT DETECTED.  The SARS-CoV-2 RNA is generally detectable in upper and lower respiratory specimens during the acute phase of infection. The lowest concentration of SARS-CoV-2 viral copies this assay can detect is 250 copies / mL. A negative result does not preclude SARS-CoV-2 infection and should not be used as the sole basis for treatment or other patient management decisions.  A negative result may occur with improper specimen collection / handling, submission of specimen other than nasopharyngeal swab, presence of viral mutation(s) within the areas targeted by this assay, and inadequate number of viral copies (<250 copies / mL). A negative result must be combined with clinical observations, patient history, and epidemiological information.  Fact Sheet for Patients:   StrictlyIdeas.no  Fact Sheet for Healthcare Providers: BankingDealers.co.za  This test is not yet approved or  cleared by the [place]  FDA and has been authorized for detection and/or diagnosis of SARS-CoV-2  by FDA under an Emergency Use Authorization (EUA).  This EUA will remain in effect (meaning this test can be used) for the duration of the COVID-19 declaration under Section 564(b)(1) of the Act, 21 U.S.C. section 360bbb-3(b)(1), unless the authorization is terminated or revoked sooner.  Performed at [facility], [place] [address], [place], [place] [address]   Lactic acid, plasma     Status: Abnormal   Collection Time: 12/26/19  1:12 PM  Result Value Ref Range   Lactic Acid, Venous 3.4 (HH) 0.5 - 1.9 mmol/L    Comment: CRITICAL VALUE NOTED.  VALUE IS CONSISTENT WITH PREVIOUSLY REPORTED AND CALLED VALUE. Performed at [facility], [place] [address] [address], [place], [place] [address]     DG Chest 2 View  Result Date: 12/26/2019 CLINICAL DATA:  Congestion and cough this week. EXAM: CHEST - 2 VIEW COMPARISON:  December 08, 2019 FINDINGS: The heart size and mediastinal contours are within normal limits. Both lungs are clear. The visualized skeletal structures are unremarkable. IMPRESSION: No active cardiopulmonary disease. Electronically Signed   By: Abelardo Diesel M.D.   On: 12/26/2019 11:04   CT ABDOMEN PELVIS W CONTRAST  Result Date: 12/26/2019 CLINICAL DATA:  Acute abdomen pain.  History of lung cancer. EXAM: CT ABDOMEN AND PELVIS WITH CONTRAST TECHNIQUE: Multidetector CT imaging of the abdomen and pelvis was performed using the standard protocol following bolus administration of intravenous contrast. CONTRAST:  2mL OMNIPAQUE IOHEXOL 300 MG/ML  SOLN COMPARISON:  April 05, 2019 FINDINGS: Lower chest: Mild dependent atelectasis of right lung base is identified. The heart size is normal. Hepatobiliary: Stable enhancing low-density lesion is identified in the posterior segment right lobe liver unchanged compared prior CT. The liver is otherwise. Gallstones in gallbladder. No inflammation is noted surrounding the gallbladder. The biliary tree is normal. Pancreas:  Unremarkable. No pancreatic ductal dilatation or surrounding inflammatory changes. Spleen: Normal in size without focal abnormality. Adrenals/Urinary Tract: 1 cm nodule is identified in the left adrenal gland unchanged. The right adrenal gland is normal. There is atrophy of the right kidney unchanged. There left kidney cyst. There is no hydronephrosis bilaterally. Foley catheter is identified in a decompressed bladder. Stomach/Bowel: There are multiple thick wall mild dilated small bowel loops in the mid and lower abdomen probably as a result of partial bowel obstruction due to herniated small bowel loops in the midline 10 cm anterior abdominal hernia. Colon is normal. The appendix is not seen but no inflammation is noted around cecum. Vascular/Lymphatic: Aortic atherosclerosis. No enlarged abdominal or pelvic lymph nodes. Reproductive: Enlarged prostate with calcifications are identified. Other: Midline anterior abdominal hernia containing small bowel are identified as previously described. Subcutaneous air is identified in the anterior right abdominal and pelvic subcutaneous fat, source indeterminate. Clinical correlation regarding recent injection in the area is suggested. There is no free air in the abdomen and pelvis. Musculoskeletal: Degenerative joint changes of the spine are noted. IMPRESSION: 1. Multiple thick wall mild dilated small bowel loops in the mid and lower abdomen probably as a result of partial bowel obstruction due to herniated small bowel loops in the midline anterior abdominal hernia. 2. Subcutaneous air is identified in the anterior right abdominal and pelvic subcutaneous fat, source indeterminate. Clinical correlation regarding recent injection in the area is suggested. There is no free air in the abdomen and pelvis. 3. Aortic atherosclerosis. These results were called by telephone at the time of  interpretation on 12/26/2019 at 12:57 pm to provider [facility] , who verbally acknowledged  these results. Aortic Atherosclerosis (ICD10-I70.0). Electronically Signed   By: Abelardo Diesel M.D.   On: 12/26/2019 13:01     A/P: LUCILE DIDONATO is an 74 y.o. male 1 week s/p chemo tx with sepsis.  Pt has a soft abd and a reducible ventral hernia.  His hernia is not the source of his sepsis.  Upon my evaluation of CT, it appears that he has a small bowel enteritis.  Agree with IV Abx.  Keep NPO.     Rosario Adie, MD  [facility]

## 2019-12-26 NOTE — ED Triage Notes (Signed)
Cancer patient c/o left abd pains and "being sick".

## 2019-12-27 ENCOUNTER — Inpatient Hospital Stay: Payer: Self-pay

## 2019-12-27 ENCOUNTER — Other Ambulatory Visit: Payer: Self-pay | Admitting: *Deleted

## 2019-12-27 ENCOUNTER — Ambulatory Visit: Payer: Self-pay | Admitting: *Deleted

## 2019-12-27 ENCOUNTER — Encounter: Payer: Self-pay | Admitting: Dietician

## 2019-12-27 DIAGNOSIS — K56609 Unspecified intestinal obstruction, unspecified as to partial versus complete obstruction: Secondary | ICD-10-CM

## 2019-12-27 LAB — GLUCOSE, CAPILLARY
Glucose-Capillary: 122 mg/dL — ABNORMAL HIGH (ref 70–99)
Glucose-Capillary: 158 mg/dL — ABNORMAL HIGH (ref 70–99)
Glucose-Capillary: 123 mg/dL — ABNORMAL HIGH (ref 70–99)
Glucose-Capillary: 94 mg/dL (ref 70–99)

## 2019-12-27 LAB — CBC
HCT: 41.2 % (ref 39.0–52.0)
Hemoglobin: 14.4 g/dL (ref 13.0–17.0)
MCH: 31.4 pg (ref 26.0–34.0)
MCHC: 35 g/dL (ref 30.0–36.0)
MCV: 90 fL (ref 80.0–100.0)
Platelets: 85 10*3/uL — ABNORMAL LOW (ref 150–400)
RBC: 4.58 MIL/uL (ref 4.22–5.81)
RDW: 13.1 % (ref 11.5–15.5)
WBC: 1 10*3/uL — CL (ref 4.0–10.5)
nRBC: 0 % (ref 0.0–0.2)

## 2019-12-27 LAB — LACTIC ACID, PLASMA
Lactic Acid, Venous: 1.2 mmol/L (ref 0.5–1.9)
Lactic Acid, Venous: 1.4 mmol/L (ref 0.5–1.9)

## 2019-12-27 LAB — PHOSPHORUS: Phosphorus: 2.5 mg/dL (ref 2.5–4.6)

## 2019-12-27 LAB — MAGNESIUM: Magnesium: 1.6 mg/dL — ABNORMAL LOW (ref 1.7–2.4)

## 2019-12-27 MED ORDER — SODIUM CHLORIDE 0.9% FLUSH: 10.0000 mL | INTRAVENOUS | Status: DC | PRN

## 2019-12-27 MED ORDER — SODIUM CHLORIDE 0.9 % IV SOLN
1000.0000 mL | INTRAVENOUS | Status: DC
  Administered 2019-12-27: 1000 mL via INTRAVENOUS

## 2019-12-27 MED ORDER — SODIUM CHLORIDE 0.9% FLUSH
10.0000 mL | Freq: Two times a day (BID) | INTRAVENOUS | Status: DC
  Administered 2019-12-27 – 2020-01-02 (×8): 10 mL

## 2019-12-27 MED ORDER — SODIUM CHLORIDE 0.9 % IV SOLN
1000.0000 mL | INTRAVENOUS | Status: DC
  Administered 2019-12-27 – 2019-12-28 (×2): 1000 mL via INTRAVENOUS

## 2019-12-27 MED ORDER — NYSTATIN 100000 UNIT/ML MT SUSP
5.0000 mL | Freq: Four times a day (QID) | OROMUCOSAL | Status: DC
  Administered 2019-12-27 – 2020-01-01 (×22): 500000 [IU] via ORAL
  Filled 2019-12-27 (×21): qty 5

## 2019-12-27 MED ORDER — INSULIN ASPART 100 UNIT/ML ~~LOC~~ SOLN
0.0000 [IU] | SUBCUTANEOUS | Status: DC
  Administered 2019-12-27 – 2019-12-28 (×2): 1 [IU] via SUBCUTANEOUS
  Administered 2019-12-28: 2 [IU] via SUBCUTANEOUS
  Administered 2019-12-29: 3 [IU] via SUBCUTANEOUS
  Administered 2019-12-29 (×2): 2 [IU] via SUBCUTANEOUS
  Administered 2019-12-29 (×2): 1 [IU] via SUBCUTANEOUS
  Administered 2019-12-29 – 2019-12-30 (×3): 2 [IU] via SUBCUTANEOUS
  Administered 2019-12-30 – 2019-12-31 (×5): 1 [IU] via SUBCUTANEOUS
  Administered 2019-12-31 – 2020-01-01 (×4): 2 [IU] via SUBCUTANEOUS
  Administered 2020-01-01 (×2): 1 [IU] via SUBCUTANEOUS

## 2019-12-27 NOTE — Progress Notes (Signed)
PROGRESS NOTE  Chad Gomez  DOB: [birth year]  PCP: [name], MD IDP:[id number]  DOA: 12/26/2019  LOS: 1 day   Chief Complaint  Patient presents with  . Abdominal Pain   Brief narrative: Chad Gomez is a 74 y.o. male with medical history significant for stage IIIa non-small cell cancer of the lung status post robotic assisted lobectomy on 10/2019, undergoing chemotherapy first dose given last week, COPD, ongoing smoking, T2DM, HTN, HLD, PAD s/p bypass, CKD, chronic indwelling Foley. Patient presented to the ED on 7/5 with progressively worsening loss of appetite, abdominal pain and generalized weakness since his chemotherapy last week. In the ED, patient had temperature 99.6, tachycardic to 100s, breathing into 20s, oxygen saturation more than 90% room air, blood pressure stable. Labs with sodium 136, potassium 4.2, blood glucose 215, BUN/creatinine 47/1.76, baseline creatinine normal, magnesium 1.6, lactic acid elevated to 3.4, WBC count low at 0.8, hemoglobin 17.5. A1c 6.1. Urinalysis sent amber color his urine with small amount of leukocytes, many bacteria Chest x-ray normal. CT abdomen and pelvis showed multiple thick wall mild dilated small bowel loops in the mid and lower abdomen probably as a result of partial bowel obstruction due to herniated small bowel loops in the midline anterior abdominal hernia.  General surgery consultation was obtained.  NG tube inserted and attached to suction. Patient was admitted to hospitalist service for further medical management.  Subjective: Patient was seen and examined this morning.  Elderly Caucasian male.  Lying down in bed. Does not feel any better than yesterday.  Continues to have tender abdomen. General surgery at bedside irrigating his NG tube. Chart reviewed. Temperature normal, hemodynamically stable. On 2 L oxygen by nasal cannula. Labs this morning showed WBC count continue to remain low at 1. Lactic acid level down to  1.4  Assessment/Plan: Partial small bowel obstruction -Present with loss of appetite, abdominal pain  -CT scan of abdomen finding as above with herniated dilated small bowel loops in the midline intra-abdominal hernia. -General surgery consult appreciated. -Continue to have abdominal tenderness. -Currently undergoing conservative management with IV fluids, bowel rest, intermediate NG suction. -Because of his poor intake for last 2 weeks, general surgery recommended TPN. -Continue to monitor.  Febrile neutropenia Lactic acidosis SIRS -No clear source.  But given immunocompromise status, low-grade fever and lactic acidosis, agree with management of sepsis. -Blood culture sent. -Broad-spectrum antibiotics started with IV cefepime, IV vancomycin, IV Flagyl.  -Maintenance fluid with normal saline at 100 mL/h. -Lactic acid level was elevated to a peak of 3.4, normalized now.  Acute kidney injury -Baseline creatinine is 1.0 in March of this year -Presented with a creatinine of 1.76.  Expect improvement with hydration. -Avoid nephrotoxic agents   Neutropenia/thrombocytopenia -Likely secondary to chemotherapy.  Given Granix in the ED -Continue to monitor.  History of hypertension -Home meds include Amlodipine 10 mg daily, indapamide 1.25 mg daily -Currently blood pressure medicines are on hold. -Continue to monitor blood pressure.  Type 2 diabetes -A1c 6.1 on 7/5. -At home on Metformin 500 mg and 1000 mg daily -Currently Metformin on hold.   -Continue sliding scale insulin with Accu-Cheks.    Peripheral vascular disease -Status post bypass approximately 1 year ago. -Pulmonary include aspirin 81 mg daily, Lipitor 40 mg daily, Zetia 10 mg daily. -Once oral intake insured, resume the same.   COPD stage IV Currently everyday smoker -Home meds include albuterol, Flonase, Stiolto -Continue bronchodilators. -Counseled to quit smoking. -21 mg nicotine patch  placed  BPH Chronic indwelling Foley catheter -Catheter was last changed 2 months ago. -Home meds include finasteride 5 mg daily, Flomax 0.4 mg daily. -Resume once oral intake is ensured.  Hypomagnesemia  -Magnesium level low at 1.6 this morning.   -IV replacement ordered.  Recheck tomorrow.  Lung cancer of the lingula stage IIIa T2aN2 Next treatment is not due for 2 weeks. Clear chest x-ray, no sign of recurrence  Anterior abdominal wall hernia -No need of surgical fixation at this time per general surgery.  Mobility: PT evaluation Code Status:   Code Status: Full Code  Nutritional status: Body mass index is 22.24 kg/m.     Diet Order            Diet NPO time specified Except for: Ice Chips, Other (See Comments)  Diet effective now                 DVT prophylaxis: enoxaparin (LOVENOX) injection 40 mg Start: 12/26/19 2200   Antimicrobials:  IV cefepime, IV vancomycin, IV Flagyl Fluid: Normal saline at 100 mL/h.  With initiation of TPN, we can cut back the fluid to maintain total amount of IV fluid to 125 ML per hour.  Consultants: General surgery Family Communication: : Updated patient's wife Ms. Chad Gomez this morning.  Status is: Inpatient  Remains inpatient appropriate because:Hemodynamically unstable and IV treatments appropriate due to intensity of illness or inability to take PO   Dispo: The patient is from: Home              Anticipated d/c is to: Pending PT eval              Anticipated d/c date is: > 3 days              Patient currently is not medically stable to d/c.   Infusions:  . sodium chloride 1,000 mL (12/27/19 1131)  . ceFEPime (MAXIPIME) IV 2 g (12/27/19 0523)  . metronidazole 500 mg (12/27/19 1141)  . vancomycin 1,000 mg (12/27/19 1148)    Scheduled Meds: . arformoterol  15 mcg Nebulization BID  . Chlorhexidine Gluconate Cloth  6 each Topical Daily  . enoxaparin (LOVENOX) injection  40 mg Subcutaneous Q24H  . fluticasone  2  spray Each Nare Daily  . insulin aspart  0-9 Units Subcutaneous TID WC  . insulin aspart  0-9 Units Subcutaneous Q4H  . nicotine  21 mg Transdermal Daily  . nystatin  5 mL Oral QID    Antimicrobials: Anti-infectives (From admission, onward)   Start     Dose/Rate Route Frequency Ordered Stop   12/27/19 1200  vancomycin (VANCOCIN) IVPB 1000 mg/200 mL premix     Discontinue     1,000 mg 200 mL/hr over 60 Minutes Intravenous Every 24 hours 12/26/19 1649     12/27/19 0600  ceFEPIme (MAXIPIME) 2 g in sodium chloride 0.9 % 100 mL IVPB     Discontinue     2 g 200 mL/hr over 30 Minutes Intravenous Every 12 hours 12/26/19 1837     12/27/19 0000  ceFEPIme (MAXIPIME) 2 g in sodium chloride 0.9 % 100 mL IVPB  Status:  Discontinued        2 g 200 mL/hr over 30 Minutes Intravenous  Once 12/26/19 1838 12/26/19 1842   12/26/19 2000  metroNIDAZOLE (FLAGYL) IVPB 500 mg     Discontinue     500 mg 100 mL/hr over 60 Minutes Intravenous Every 8 hours 12/26/19 1837     12/26/19  1930  ceFEPIme (MAXIPIME) 2 g in sodium chloride 0.9 % 100 mL IVPB  Status:  Discontinued        2 g 200 mL/hr over 30 Minutes Intravenous  Once 12/26/19 1842 12/26/19 1847   12/26/19 1900  ceFEPIme (MAXIPIME) 2 g in sodium chloride 0.9 % 100 mL IVPB        2 g 200 mL/hr over 30 Minutes Intravenous  Once 12/26/19 1847 12/26/19 1943   12/26/19 1115  piperacillin-tazobactam (ZOSYN) IVPB 3.375 g        3.375 g 100 mL/hr over 30 Minutes Intravenous  Once 12/26/19 1103 12/26/19 1159   12/26/19 1115  vancomycin (VANCOREADY) IVPB 1500 mg/300 mL        1,500 mg 150 mL/hr over 120 Minutes Intravenous  Once 12/26/19 1103 12/26/19 1428      PRN meds: albuterol, HYDROmorphone (DILAUDID) injection, ondansetron (ZOFRAN) IV   Objective: Vitals:   12/27/19 0759 12/27/19 1250  BP:  118/68  Pulse:  82  Resp:  16  Temp:  98.2 F (36.8 C)  SpO2: 96% 97%    Intake/Output Summary (Last 24 hours) at 12/27/2019 1315 Last data filed at  12/27/2019 0809 Gross per 24 hour  Intake 7525.13 ml  Output 2200 ml  Net 5325.13 ml   Filed Weights   12/26/19 1003 12/26/19 1756  Weight: 76.2 kg 70.3 kg   Weight change:  Body mass index is 22.24 kg/m.   Physical Exam: General exam: Appears calm and comfortable.  Mild to moderate distress because of abdominal pain. Skin: No rashes, lesions or ulcers. HEENT: Atraumatic, normocephalic, supple neck, no obvious bleeding Lungs: Clear to auscultation bilaterally CVS: Regular rate and rhythm, no murmur GI/Abd soft, tenderness moderate diffuse, bowel sounds sluggish CNS: Alert, awake, oriented x3 Psychiatry: Depressed look Extremities: No pedal edema, no calf tenderness  Data Review: I have personally reviewed the laboratory data and studies available.  Recent Labs  Lab 12/26/19 1004 12/27/19 0233  WBC 0.8* 1.0*  NEUTROABS 0.4*  --   HGB 17.5* 14.4  HCT 50.9 41.2  MCV 90.1 90.0  PLT 130* 85*   Recent Labs  Lab 12/26/19 1004 12/27/19 0233  NA 136  --   K 4.2  --   CL 91*  --   CO2 28  --   GLUCOSE 215*  --   BUN 47*  --   CREATININE 1.76*  --   CALCIUM 10.5*  --   MG  --  1.6*  PHOS  --  2.5   Recent Labs    12/26/19 1004  HGBA1C 6.1*    Signed, Terrilee Croak, MD [facility] Pager: [phone] (Secure Chat preferred). 12/27/2019

## 2019-12-27 NOTE — Progress Notes (Signed)
Initial Nutrition Assessment  DOCUMENTATION CODES:   Severe malnutrition in context of acute illness/injury  INTERVENTION:  - TPN initiation and advancement per Pharmacist. - Monitor magnesium, potassium, and phosphorus daily for at least 3 days, MD to replete as needed, as pt is at risk for refeeding syndrome given severe malnutrition.   NUTRITION DIAGNOSIS:   Severe Malnutrition related to acute illness, cancer and cancer related treatments as evidenced by moderate fat depletion, moderate muscle depletion.  GOAL:   Patient will meet greater than or equal to 90% of their needs  MONITOR:   Diet advancement, Labs, Weight trends, I & O's, Other (Comment) (TPN regimen)  REASON FOR ASSESSMENT:   Malnutrition Screening Tool, Consult New TPN/TNA  ASSESSMENT:   74 y.o. male with medical history of stage IIIa NSCC of the lung s/p robotic assisted lobectomy 10/2019, undergoing chemotherapy first dose given last week, COPD, ongoing smoking, type 2 DM (A1c: 6.1%), HTN, HLD, PAD s/p bypass, CKD, chronic indwelling Foley. He presented to the ED on 7/5 due to progressively worsening loss of appetite, abdominal pain, and generalized weakness since his chemo treatment. CXR was normal. CT abdomen/pelvis showed multiple thick wall mild dilated small bowel loops in the mid and lower abdomen probably as a result of partial bowel obstruction d/t herniated small bowel loops in the midline anterior abdominal hernia. General Surgery consulted and NGT placed.  Patient has been NPO since admission. NGT in place in L nare (tube location not reported on abd xray yesterday). He has had ~300 ml output this shift.   Patient reports pain and nausea without vomiting for the past week with PO intakes. For the 3-4 days PTA he did not eat at all. He has been feeling weak and very tired at all times and often sleeps the majority of the day for at least the past few days.   Unable to obtain more detailed diet  recall/nutrition-related information at this time.   Weight yesterday was 155 lb and weight on 6/9 was 168 lb. This indicates 13 lb weight loss (7.7% body weight) in the past 1 month; significant for time frame.   Surgery following and note from this AM states ok for ice chips and sips, but otherwise not to have anything PO x2 weeks.   Per notes: - partial SBO - lactic acidosis, SIRS, febrile neutropenia - AKI - stage 4 COPD who continues to smoke daily - stage 3 lung cancer with first chemo 1 week ago and next treatment in 2 weeks   Labs reviewed; CBGs: 122 and 158 mg/dl, Cl: 91 mmol/l, BUN: 47 mg/dl, creatinine: 1.76 mg/dl, Ca: 10.5 mg/dl, GFR: 38 ml/min. Medications reviewed; sliding scale novolog, 5 ml mycostatin QID. IVF; NS @ 100 ml/hr.     NUTRITION - FOCUSED PHYSICAL EXAM:    Most Recent Value  Orbital Region Moderate depletion  Upper Arm Region Moderate depletion  Thoracic and Lumbar Region Unable to assess  Buccal Region Moderate depletion  Temple Region Moderate depletion  Clavicle Bone Region Severe depletion  Clavicle and Acromion Bone Region Severe depletion  Scapular Bone Region Unable to assess  Dorsal Hand Mild depletion  Patellar Region Unable to assess  Anterior Thigh Region Unable to assess  Posterior Calf Region Unable to assess  Edema (RD Assessment) Unable to assess  Hair Reviewed  Eyes Reviewed  Mouth Reviewed  Skin Reviewed  Nails Reviewed       Diet Order:   Diet Order  Diet NPO time specified Except for: Ice Chips, Other (See Comments)  Diet effective now                 EDUCATION NEEDS:   Not appropriate for education at this time  Skin:  Skin Assessment: Reviewed RN Assessment  Last BM:  7/3, per patient  Height:   Ht Readings from Last 1 Encounters:  12/26/19 5\' 10"  (1.778 m)    Weight:   Wt Readings from Last 1 Encounters:  12/26/19 70.3 kg     Estimated Nutritional Needs:  Kcal:  2125-2320  kcal Protein:  110-125 grams Fluid:  >/= 2.1 L/day     Jarome Matin, MS, RD, LDN, CNSC Inpatient Clinical Dietitian RD pager # available in AMION  After hours/weekend pager # available in [facility]

## 2019-12-27 NOTE — Progress Notes (Addendum)
° ° °  CC:  Subjective: Patient says he does not feel any better than yesterday.  His abdomen still very tender.  He has a palpable ventral hernia that is easily reducible.  His NG tube is in place but was not working.  I irrigated it and it is working now.  I put it down to intermittent suction as opposed to regular suction also.  Objective: Vital signs in last 24 hours: Temp:  [98.2 F (36.8 C)-99.7 F (37.6 C)] 98.7 F (37.1 C) (07/06 0526) Pulse Rate:  [65-130] 65 (07/06 0526) Resp:  [16-31] 18 (07/06 0215) BP: (109-144)/(68-104) 113/77 (07/06 0526) SpO2:  [90 %-100 %] 96 % (07/06 0759) Weight:  [70.3 kg-76.2 kg] 70.3 kg (07/05 1756) Last BM Date: 12/24/19 N.p.o. 7575 IV 1600 urine 600 per NG Afebrile vital signs are stable, tachycardia has improved. O2 sats 91 to 96% on 2 L nasal cannula  Intake/Output from previous day: 07/05 0701 - 07/06 0700 In: 7575.1 [I.V.:1395.8; IV Piggyback:6179.3] Out: 2200 [Urine:1600; Emesis/NG output:600] Intake/Output this shift: No intake/output data recorded.  General appearance: alert, cooperative and no distress Resp: clear to auscultation bilaterally and Anterior exam GI: Few bowel sounds.  Patient was having diarrhea up until he came into the hospital.  He attributed this to Metformin.  No BM since admission.  Some flatus.  Abdomen is soft, tender, he has a ventral hernia that is easily reducible.  Lab Results:  Recent Labs    12/26/19 1004 12/27/19 0233  WBC 0.8* 1.0*  HGB 17.5* 14.4  HCT 50.9 41.2  PLT 130* 85*    BMET Recent Labs    12/26/19 1004  NA 136  K 4.2  CL 91*  CO2 28  GLUCOSE 215*  BUN 47*  CREATININE 1.76*  CALCIUM 10.5*   PT/INR Recent Labs    12/26/19 1004  LABPROT 13.8  INR 1.1    Recent Labs  Lab 12/26/19 1004  AST 23  ALT 31  ALKPHOS 71  BILITOT 1.4*  PROT 7.1  ALBUMIN 4.0     Lipase     Component Value Date/Time   LIPASE 18 12/26/2019 1004     Medications:  arformoterol   15 mcg Nebulization BID   Chlorhexidine Gluconate Cloth  6 each Topical Daily   enoxaparin (LOVENOX) injection  40 mg Subcutaneous Q24H   fluticasone  2 spray Each Nare Daily   insulin aspart  0-9 Units Subcutaneous TID WC   nicotine  21 mg Transdermal Daily   nystatin  5 mL Oral QID    sodium chloride     ceFEPime (MAXIPIME) IV 2 g (12/27/19 0523)   metronidazole 500 mg (12/27/19 0309)   vancomycin      Assessment/Plan Sepsis/neutropenia from chemotherapy.  -WBC 0.8>> 1.0 Stage IIIa T2aN2 lung cancer Ongoing tobacco use Type 2 diabetes -A1c 6.1 Hypertension PVD Acute renal failure  -Creatinine 1.76 Thrombocytopenia  - platelets 130>>85  Abdominal pain, fatigue, anorexia SBO/enteritis by CT Reducible ventral hernia  FEN: N.p.o./IV fluids ID: Zosyn 7/5 x 1; Maxipime 7/5>> day 2; Flagyl 7/5>> day 2; vancomycin 7/5>> day 2 DVT: Lovenox Follow-up: TBD  Plan: Irrigate the NG every 6 hours, make sure its functional.  I will let him have some ice chips and sips.  Continue medical management.  Recheck labs in AM. Reviewed by Dr. Barry Dienes, nothing PO for 2 weeks; she recommends TPN, discussed with Dr. Pietro Cassis.    LOS: 1 day    Chad Gomez 12/27/2019 Please see Amion

## 2019-12-27 NOTE — Progress Notes (Signed)
Nutrition  Patient identified on malnutrition screening tool for weight loss. Patient scheduled for initial nutrition assessment via telephone at 9:00 am. Per chart review, patient is currently admitted to hospital. Will reschedule as able.  Chad Gomez, RD, LDN Clinical Nutrition After Hours/Weekend Pager # in [place]

## 2019-12-27 NOTE — Progress Notes (Signed)
CRITICAL VALUE ALERT  Critical Value:  WBC 1.0  Date & Time Notied:  12/27/19@0308   Provider Notified: Yes  Orders Received/Actions taken:

## 2019-12-27 NOTE — Patient Outreach (Addendum)
[facility]) Care Management  12/27/2019  DEBBIE BELLUCCI March 22, 1946 [id number]   Received [facility] / Epic in-basket message, states patient hospitalized on 01/22/2020 for SBO (small bowel obstruction) and remains inpatient.   Sent the following in-basket message to [facility] Kitchen[facility]ividad Bro[facility] and [place] [place]): Gilles Chiquito, Patient hospitalized on 2020/01/22 for SBO (small bowel obstruction) and remains inpatient.  Please follow up for discharge planning / disposition.   No patient outreach at this time due to hospitalization from this RNCM,  will follow up post discharge if patient hospitalized less than 10 days, and if greater than 10 days, will close case.    Lynard Postlewait H. Annia Friendly, BSN, [place] Management [facility] Telephonic CM Phone: [phone] Fax: [phone]

## 2019-12-27 NOTE — Plan of Care (Signed)
  Problem: Education: Goal: Knowledge of General Education information will improve Description: Including pain rating scale, medication(s)/side effects and non-pharmacologic comfort measures Outcome: Progressing   Problem: Health Behavior/Discharge Planning: Goal: Ability to manage health-related needs will improve Outcome: Progressing   Problem: Clinical Measurements: Goal: Ability to maintain clinical measurements within normal limits will improve Outcome: Progressing Goal: Will remain free from infection Outcome: Progressing Goal: Diagnostic test results will improve Outcome: Progressing Goal: Respiratory complications will improve Outcome: Progressing Goal: Cardiovascular complication will be avoided Outcome: Progressing   Problem: Activity: Goal: Risk for activity intolerance will decrease Outcome: Progressing   Problem: Nutrition: Goal: Adequate nutrition will be maintained Outcome: Progressing   Problem: Coping: Goal: Level of anxiety will decrease Outcome: Progressing   Problem: Elimination: Goal: Will not experience complications related to bowel motility Outcome: Progressing Goal: Will not experience complications related to urinary retention Outcome: Progressing   Problem: Pain Managment: Goal: General experience of comfort will improve Outcome: Progressing   Problem: Safety: Goal: Ability to remain free from injury will improve Outcome: Progressing   Problem: Skin Integrity: Goal: Risk for impaired skin integrity will decrease Outcome: Progressing   Problem: Education: Goal: Knowledge of secondary prevention will improve Outcome: Progressing   Problem: Self-Care: Goal: Ability to communicate needs accurately will improve Outcome: Progressing   Problem: Nutrition: Goal: Risk of aspiration will decrease Outcome: Progressing

## 2019-12-27 NOTE — Progress Notes (Signed)
PHARMACY - TOTAL PARENTERAL NUTRITION CONSULT NOTE   Indication: prolonged intolerance to enteral feedings and per [facility] plan is for patient to have nothing PO x 2 weeks and start TPN  A/P: Will review TPN labs tomorrow and TPN will be started tomorrow PM. TPN consult entered after cutoff time today for TPN to be able to be started this evening.    Kara Mead 12/27/2019,12:24 PM

## 2019-12-27 NOTE — Progress Notes (Signed)
Peripherally Inserted Central Catheter Placement  The IV Nurse has discussed with the patient and/or persons authorized to consent for the patient, the purpose of this procedure and the potential benefits and risks involved with this procedure.  The benefits include less needle sticks, lab draws from the catheter, and the patient may be discharged home with the catheter. Risks include, but not limited to, infection, bleeding, blood clot (thrombus formation), and puncture of an artery; nerve damage and irregular heartbeat and possibility to perform a PICC exchange if needed/ordered by physician.  Alternatives to this procedure were also discussed.  Bard Power PICC patient education guide, fact sheet on infection prevention and patient information card has been provided to patient /or left at bedside.    PICC Placement Documentation  PICC Double Lumen 12/27/19 PICC Right Brachial 38 cm 0 cm (Active)  Indication for Insertion or Continuance of Line Administration of hyperosmolar/irritating solutions (i.e. TPN, Vancomycin, etc.) 12/27/19 1852  Exposed Catheter (cm) 0 cm 12/27/19 1852  Site Assessment Clean;Dry;Intact 12/27/19 1852  Lumen #1 Status Flushed;Saline locked;Blood return noted 12/27/19 1852  Lumen #2 Status Flushed;Saline locked;Blood return noted 12/27/19 1852  Dressing Type Transparent 12/27/19 1852  Dressing Status Clean;Intact;Antimicrobial disc in place;Dry 12/27/19 1852  Safety Lock Not Applicable 86/76/19 5093  Dressing Intervention New dressing 12/27/19 1852  Dressing Change Due 01/03/20 12/27/19 1852       Gordan Payment 12/27/2019, 6:53 PM

## 2019-12-27 NOTE — Consult Note (Signed)
   [facility] Inpatient Consult   12/27/2019  Chad Gomez 1945/12/21 [id number]   Notified of patient admission by [facility] community care manager. Patient is currently active with [facility] for chronic disease management services.  Patient has been engaged by a [facility].   Will continue to follow for progression and disposition plans and update community care manager of patient progression.  Of note, [facility] Care Management services does not replace or interfere with any services that are arranged by inpatient case management or social work.  Netta Cedars, MSN, [facility] Liaison Nurse Mobile Phone [phone]  Toll free office [phone]

## 2019-12-27 NOTE — Evaluation (Signed)
Physical Therapy Evaluation Patient Details Name: Chad Gomez MRN: [id number] DOB: [birth year] Today's Date: 12/27/2019   History of Present Illness  74 y.o. male with medical history significant for stage IIIa non-small cell cancer of the lung status post robotic assisted lobectomy on 10/2019, undergoing chemotherapy first dose given last week, COPD, ongoing smoking, T2DM, HTN, HLD, PAD s/p bypass, CKD, chronic indwelling Foley.  Pt admitted for partial SBO.  Clinical Impression  Pt admitted with above diagnosis.  Pt currently with functional limitations due to the deficits listed below (see PT Problem List). Pt will benefit from skilled PT to increase their independence and safety with mobility to allow discharge to the venue listed below.  Pt only agreeable to ambulate within room to recliner today so encouraged more ambulation next visit. Pt reports he has RW and BSC from a kidney stones admission and reports being familiar with PT from that admission.  Pt anticipates d/c home without any need for assist.     Follow Up Recommendations No PT follow up;Supervision for mobility/OOB    Equipment Recommendations  None recommended by PT    Recommendations for Other Services       Precautions / Restrictions Precautions Precautions: Fall Precaution Comments: NG tube      Mobility  Bed Mobility Overal bed mobility: Needs Assistance Bed Mobility: Supine to Sit     Supine to sit: Min assist     General bed mobility comments: verbal cues sidelying to sit for abdominal comfort, slight assist for trunk  Transfers Overall transfer level: Needs assistance Equipment used: Rolling walker (2 wheeled) Transfers: Sit to/from Stand Sit to Stand: Min assist         General transfer comment: initially mod however min with gait belt applied, cues for hand placement  Ambulation/Gait Ambulation/Gait assistance: Min guard Gait Distance (Feet): 6 Feet Assistive device: Rolling walker (2  wheeled) Gait Pattern/deviations: Step-through pattern;Decreased stride length;Trunk flexed     General Gait Details: verbal cues for RW positioning, tends to keep too far forward, only agreeable to take a few steps to recliner, declined hallway at this time  Stairs            Wheelchair Mobility    Modified Rankin (Stroke Patients Only)       Balance                                             Pertinent Vitals/Pain Pain Assessment: Faces Faces Pain Scale: Hurts little more Pain Location: abdomen with movement Pain Descriptors / Indicators: Sore Pain Intervention(s): Repositioned;Monitored during session    Home Living Family/patient expects to be discharged to:: Private residence Living Arrangements: Spouse/significant other   Type of Home: House Home Access: Stairs to enter Entrance Stairs-Rails: Right Entrance Stairs-Number of Steps: 3-4 Home Layout: One level Home Equipment: Environmental consultant - 2 wheels;Bedside commode      Prior Function Level of Independence: Independent               Hand Dominance        Extremity/Trunk Assessment        Lower Extremity Assessment Lower Extremity Assessment: Generalized weakness    Cervical / Trunk Assessment Cervical / Trunk Assessment: Normal  Communication   Communication: No difficulties  Cognition Arousal/Alertness: Awake/alert Behavior During Therapy: Flat affect Overall Cognitive Status: Within Functional Limits for tasks assessed  General Comments      Exercises     Assessment/Plan    PT Assessment Patient needs continued PT services  PT Problem List Decreased mobility;Decreased strength;Decreased activity tolerance;Decreased knowledge of use of DME;Decreased balance       PT Treatment Interventions Gait training;DME instruction;Therapeutic exercise;Functional mobility training;Therapeutic activities;Patient/family  education;Stair training    PT Goals (Current goals can be found in the Care Plan section)  Acute Rehab PT Goals PT Goal Formulation: With patient Time For Goal Achievement: 01/10/20 Potential to Achieve Goals: Good    Frequency Min 3X/week   Barriers to discharge        Co-evaluation               AM-PAC PT "6 Clicks" Mobility  Outcome Measure Help needed turning from your back to your side while in a flat bed without using bedrails?: A Little Help needed moving from lying on your back to sitting on the side of a flat bed without using bedrails?: A Little Help needed moving to and from a bed to a chair (including a wheelchair)?: A Little Help needed standing up from a chair using your arms (e.g., wheelchair or bedside chair)?: A Little Help needed to walk in hospital room?: A Little Help needed climbing 3-5 steps with a railing? : A Lot 6 Click Score: 17    End of Session Equipment Utilized During Treatment: Gait belt Activity Tolerance: Patient tolerated treatment well Patient left: in chair;with call bell/phone within reach;with chair alarm set Nurse Communication: Mobility status PT Visit Diagnosis: Difficulty in walking, not elsewhere classified (R26.2)    Time: 2863-8177 PT Time Calculation (min) (ACUTE ONLY): 15 min   Charges:   PT Evaluation $PT Eval Low Complexity: 1 Low         Kati PT, DPT Acute Rehabilitation Services Pager: [phone] Office: [phone]    York Ram E 12/27/2019, 4:19 PM

## 2019-12-28 DIAGNOSIS — E872 Acidosis: Secondary | ICD-10-CM

## 2019-12-28 DIAGNOSIS — E785 Hyperlipidemia, unspecified: Secondary | ICD-10-CM

## 2019-12-28 DIAGNOSIS — E118 Type 2 diabetes mellitus with unspecified complications: Secondary | ICD-10-CM

## 2019-12-28 DIAGNOSIS — E860 Dehydration: Secondary | ICD-10-CM

## 2019-12-28 DIAGNOSIS — I70213 Atherosclerosis of native arteries of extremities with intermittent claudication, bilateral legs: Secondary | ICD-10-CM

## 2019-12-28 DIAGNOSIS — J449 Chronic obstructive pulmonary disease, unspecified: Secondary | ICD-10-CM

## 2019-12-28 DIAGNOSIS — Z978 Presence of other specified devices: Secondary | ICD-10-CM

## 2019-12-28 DIAGNOSIS — E43 Unspecified severe protein-calorie malnutrition: Secondary | ICD-10-CM | POA: Insufficient documentation

## 2019-12-28 DIAGNOSIS — D701 Agranulocytosis secondary to cancer chemotherapy: Secondary | ICD-10-CM

## 2019-12-28 DIAGNOSIS — Z720 Tobacco use: Secondary | ICD-10-CM

## 2019-12-28 DIAGNOSIS — I10 Essential (primary) hypertension: Secondary | ICD-10-CM

## 2019-12-28 DIAGNOSIS — J432 Centrilobular emphysema: Secondary | ICD-10-CM

## 2019-12-28 DIAGNOSIS — T451X5A Adverse effect of antineoplastic and immunosuppressive drugs, initial encounter: Secondary | ICD-10-CM

## 2019-12-28 DIAGNOSIS — C3410 Malignant neoplasm of upper lobe, unspecified bronchus or lung: Secondary | ICD-10-CM

## 2019-12-28 LAB — COMPREHENSIVE METABOLIC PANEL
ALT: 20 U/L (ref 0–44)
AST: 17 U/L (ref 15–41)
Albumin: 2.2 g/dL — ABNORMAL LOW (ref 3.5–5.0)
Alkaline Phosphatase: 40 U/L (ref 38–126)
Anion gap: 11 (ref 5–15)
BUN: 37 mg/dL — ABNORMAL HIGH (ref 8–23)
CO2: 25 mmol/L (ref 22–32)
Calcium: 8 mg/dL — ABNORMAL LOW (ref 8.9–10.3)
Chloride: 104 mmol/L (ref 98–111)
Creatinine, Ser: 0.91 mg/dL (ref 0.61–1.24)
GFR calc Af Amer: >60 mL/min (ref >60)
GFR calc non Af Amer: >60 mL/min (ref >60)
Glucose, Bld: 93 mg/dL (ref 70–99)
Potassium: 2.6 mmol/L — CL (ref 3.5–5.1)
Sodium: 140 mmol/L (ref 135–145)
Total Bilirubin: 0.7 mg/dL (ref 0.3–1.2)
Total Protein: 4.5 g/dL — ABNORMAL LOW (ref 6.5–8.1)

## 2019-12-28 LAB — MAGNESIUM: Magnesium: 1.6 mg/dL — ABNORMAL LOW (ref 1.7–2.4)

## 2019-12-28 LAB — DIFFERENTIAL
Abs Immature Granulocytes: 0.02 10*3/uL (ref 0.00–0.07)
Basophils Absolute: 0 10*3/uL (ref 0.0–0.1)
Basophils Relative: 1 %
Eosinophils Absolute: 0.1 10*3/uL (ref 0.0–0.5)
Eosinophils Relative: 4 %
Immature Granulocytes: 1 %
Lymphocytes Relative: 27 %
Lymphs Abs: 0.5 10*3/uL — ABNORMAL LOW (ref 0.7–4.0)
Monocytes Absolute: 0.1 10*3/uL (ref 0.1–1.0)
Monocytes Relative: 3 %
Neutro Abs: 1.3 10*3/uL — ABNORMAL LOW (ref 1.7–7.7)
Neutrophils Relative %: 64 %

## 2019-12-28 LAB — CBC
HCT: 36.9 % — ABNORMAL LOW (ref 39.0–52.0)
Hemoglobin: 12.5 g/dL — ABNORMAL LOW (ref 13.0–17.0)
MCH: 30.9 pg (ref 26.0–34.0)
MCHC: 33.9 g/dL (ref 30.0–36.0)
MCV: 91.3 fL (ref 80.0–100.0)
Platelets: 60 10*3/uL — ABNORMAL LOW (ref 150–400)
RBC: 4.04 MIL/uL — ABNORMAL LOW (ref 4.22–5.81)
RDW: 12.9 % (ref 11.5–15.5)
WBC: 2 10*3/uL — ABNORMAL LOW (ref 4.0–10.5)
nRBC: 0 % (ref 0.0–0.2)

## 2019-12-28 LAB — GLUCOSE, CAPILLARY
Glucose-Capillary: 102 mg/dL — ABNORMAL HIGH (ref 70–99)
Glucose-Capillary: 103 mg/dL — ABNORMAL HIGH (ref 70–99)
Glucose-Capillary: 135 mg/dL — ABNORMAL HIGH (ref 70–99)
Glucose-Capillary: 177 mg/dL — ABNORMAL HIGH (ref 70–99)
Glucose-Capillary: 198 mg/dL — ABNORMAL HIGH (ref 70–99)
Glucose-Capillary: 90 mg/dL (ref 70–99)
Glucose-Capillary: 99 mg/dL (ref 70–99)

## 2019-12-28 LAB — PHOSPHORUS: Phosphorus: 1.7 mg/dL — ABNORMAL LOW (ref 2.5–4.6)

## 2019-12-28 LAB — TRIGLYCERIDES: Triglycerides: 93 mg/dL (ref <150)

## 2019-12-28 LAB — PREALBUMIN: Prealbumin: 9.8 mg/dL — ABNORMAL LOW (ref 18–38)

## 2019-12-28 MED ORDER — TRAVASOL 10 % IV SOLN
INTRAVENOUS | Status: AC
  Filled 2019-12-28: qty 499.2

## 2019-12-28 MED ORDER — SODIUM CHLORIDE 0.9 % IV SOLN: INTRAVENOUS | Status: DC

## 2019-12-28 MED ORDER — PANTOPRAZOLE SODIUM 40 MG IV SOLR
40.0000 mg | INTRAVENOUS | Status: DC
  Administered 2019-12-28 – 2019-12-31 (×4): 40 mg via INTRAVENOUS
  Filled 2019-12-28 (×4): qty 40

## 2019-12-28 MED ORDER — POTASSIUM CHLORIDE 10 MEQ/100ML IV SOLN
10.0000 meq | INTRAVENOUS | Status: DC
  Administered 2019-12-28 (×2): 10 meq via INTRAVENOUS
  Filled 2019-12-28 (×2): qty 100

## 2019-12-28 MED ORDER — POTASSIUM CHLORIDE 10 MEQ/100ML IV SOLN
10.0000 meq | INTRAVENOUS | Status: AC
  Administered 2019-12-28 (×2): 10 meq via INTRAVENOUS
  Filled 2019-12-28 (×2): qty 100

## 2019-12-28 MED ORDER — POTASSIUM PHOSPHATES 15 MMOLE/5ML IV SOLN
15.0000 mmol | Freq: Once | INTRAVENOUS | Status: AC
  Administered 2019-12-28: 15 mmol via INTRAVENOUS
  Filled 2019-12-28: qty 5

## 2019-12-28 MED ORDER — MAGNESIUM SULFATE 2 GM/50ML IV SOLN
2.0000 g | Freq: Once | INTRAVENOUS | Status: AC
  Administered 2019-12-28: 2 g via INTRAVENOUS
  Filled 2019-12-28: qty 50

## 2019-12-28 MED ORDER — PROCHLORPERAZINE EDISYLATE 10 MG/2ML IJ SOLN
10.0000 mg | Freq: Four times a day (QID) | INTRAMUSCULAR | Status: DC | PRN
  Administered 2019-12-28: 10 mg via INTRAVENOUS
  Filled 2019-12-28: qty 2

## 2019-12-28 MED ORDER — VANCOMYCIN HCL 750 MG/150ML IV SOLN
750.0000 mg | Freq: Two times a day (BID) | INTRAVENOUS | Status: AC
  Administered 2019-12-28 – 2019-12-31 (×7): 750 mg via INTRAVENOUS
  Filled 2019-12-28 (×7): qty 150

## 2019-12-28 MED ORDER — SODIUM CHLORIDE 0.9 % IV SOLN
2.0000 g | Freq: Three times a day (TID) | INTRAVENOUS | Status: DC
  Administered 2019-12-28 – 2020-01-02 (×15): 2 g via INTRAVENOUS
  Filled 2019-12-28 (×16): qty 2

## 2019-12-28 NOTE — Progress Notes (Signed)
Pharmacy Antibiotic Note  Chad Gomez is a 74 y.o. male admitted on 12/26/2019 with sepsis, neutropenia likely due to chemotherapy, SBO.  Pharmacy has been consulted for vancomycin dosing.   D3 Vanc/Cefepime/Flagyl Afebrile WBC 2 and ANC 1.3 both improved SCr 0.91 improved from 1.76 CrCl 73  Plan:  With improved renal function, change cefepime from 2g q12 to 2g q8  Also change Vanc from 1g q24 to 750mg  IV q12 - goal trough 15-20  Flagyl per Md  F/U renal function, culture results, clinical course, levels as indicated  With pt being afebrile, improved labs, and no culture growth to date, would consider discontinuing vancomycin   Height: 5\' 10"  (177.8 cm) Weight: 70.3 kg (155 lb) IBW/kg (Calculated) : 73  Temp (24hrs), Avg:98.1 F (36.7 C), Min:97.9 F (36.6 C), Max:98.2 F (36.8 C)  Recent Labs  Lab 12/26/19 1004 12/26/19 1312 12/27/19 0233 12/27/19 0422 12/28/19 0354  WBC 0.8*  --  1.0*  --  2.0*  CREATININE 1.76*  --   --   --  0.91  LATICACIDVEN 2.8* 3.4* 1.2 1.4  --     Estimated Creatinine Clearance: 73 mL/min (by C-G formula based on SCr of 0.91 mg/dL).    Allergies  Allergen Reactions  . Morphine And Related Anxiety and Other (See Comments)    Combative, incoherent    . Tape Other (See Comments) and Rash    Surgical tape Adhesive tape causes blisters  . Amoxicillin Other (See Comments)    Severe stomach pain  . Naproxen Swelling    Eyes swell   Antimicrobials this admission:  vancomycin 7/5 >>  zosyn 7/5 >> x 1 Cefepime 7/5 >> Flagyl 7/5 >>  Dose adjustments this admission:    Microbiology results:  7/5 BCx: NGTD   Thank you for allowing pharmacy to be a part of this patient's care.  Kara Mead 12/28/2019 8:46 AM

## 2019-12-28 NOTE — Progress Notes (Signed)
Patient's NGT was ordered to be clamp this morning and for RN to monitor how patient tolerates clear liquid diet. Patient progressively became more nauseated and had small episodes of emesis throughout the day requiring PRN Zofran. MD Bonner Puna was paged and an additional PRN antiemetic medication was ordered after patient reported his nausea becoming more intolerable. RN paged general surgery regarding patient's current condition and was advised to put patient's NGT back on low-intermittent suction. Immediately after suction was placed back , patient produced 200 mL of brown/bile colored output in cannister. Patient stated he feels better at this time and is resting. Will continue to assess.

## 2019-12-28 NOTE — Progress Notes (Signed)
[facility] Progress Note     Subjective: Patient reports he feels "crappy" this AM. He is upset that TPN has not been started and is not scheduled to start until this evening. He reports abdominal pain. He has had some loose BMs and is passing flatus. He reports that at home he normally has 7-8 BMs in a day and then won't have a BM for 5-6 days, but is insistent that he does not have constipation. He says this a consistent pattern for him.   Objective: Vital signs in last 24 hours: Temp:  [97.9 F (36.6 C)-98.2 F (36.8 C)] 97.9 F (36.6 C) (07/07 0500) Pulse Rate:  [77-85] 77 (07/07 0500) Resp:  [16-22] 20 (07/07 0500) BP: (115-131)/(68-84) 131/84 (07/07 0500) SpO2:  [96 %-98 %] 96 % (07/07 0819) Last BM Date: 12/28/19  Intake/Output from previous day: 07/06 0701 - 07/07 0700 In: 2285 [I.V.:1835; NG/GT:100; IV Piggyback:100] Out: 1750 [Urine:950; Emesis/NG output:800] Intake/Output this shift: No intake/output data recorded.  PE: General: WD, cachectic white male who is laying in bed in NAD HEENT: Sclera are noninjected.  PERRL.  Ears and nose without any masses or lesions.  Mouth is pink and moist Heart: regular, rate, and rhythm.  Normal s1,s2. No obvious murmurs, gallops, or rubs noted.  Palpable radial and pedal pulses bilaterally Lungs: Respiratory effort nonlabored, nasal cannula present Abd: soft, diffusely ttp, distended, +BS, reducible ventral hernia, NGT with bloody appearing drainage    Lab Results:  Recent Labs    12/27/19 0233 12/28/19 0354  WBC 1.0* 2.0*  HGB 14.4 12.5*  HCT 41.2 36.9*  PLT 85* 60*   BMET Recent Labs    12/26/19 1004 12/28/19 0354  NA 136 140  K 4.2 2.6*  CL 91* 104  CO2 28 25  GLUCOSE 215* 93  BUN 47* 37*  CREATININE 1.76* 0.91  CALCIUM 10.5* 8.0*   PT/INR Recent Labs    12/26/19 1004  LABPROT 13.8  INR 1.1   CMP     Component Value Date/Time   NA 140 12/28/2019 0354   K 2.6 (LL) 12/28/2019 0354   CL  104 12/28/2019 0354   CO2 25 12/28/2019 0354   GLUCOSE 93 12/28/2019 0354   BUN 37 (H) 12/28/2019 0354   CREATININE 0.91 12/28/2019 0354   CREATININE 0.87 12/19/2019 0801   CALCIUM 8.0 (L) 12/28/2019 0354   PROT 4.5 (L) 12/28/2019 0354   ALBUMIN 2.2 (L) 12/28/2019 0354   AST 17 12/28/2019 0354   AST 14 (L) 12/19/2019 0801   ALT 20 12/28/2019 0354   ALT 20 12/19/2019 0801   ALKPHOS 40 12/28/2019 0354   BILITOT 0.7 12/28/2019 0354   BILITOT 0.6 12/19/2019 0801   GFRNONAA >60 12/28/2019 0354   GFRNONAA >60 12/19/2019 0801   GFRAA >60 12/28/2019 0354   GFRAA >60 12/19/2019 0801   Lipase     Component Value Date/Time   LIPASE 18 12/26/2019 1004       Studies/Results: DG Chest 2 View  Result Date: 12/26/2019 CLINICAL DATA:  Congestion and cough this week. EXAM: CHEST - 2 VIEW COMPARISON:  December 08, 2019 FINDINGS: The heart size and mediastinal contours are within normal limits. Both lungs are clear. The visualized skeletal structures are unremarkable. IMPRESSION: No active cardiopulmonary disease. Electronically Signed   By: Abelardo Diesel M.D.   On: 12/26/2019 11:04   CT ABDOMEN PELVIS W CONTRAST  Result Date: 12/26/2019 CLINICAL DATA:  Acute abdomen pain.  History of lung cancer.  EXAM: CT ABDOMEN AND PELVIS WITH CONTRAST TECHNIQUE: Multidetector CT imaging of the abdomen and pelvis was performed using the standard protocol following bolus administration of intravenous contrast. CONTRAST:  60mL OMNIPAQUE IOHEXOL 300 MG/ML  SOLN COMPARISON:  April 05, 2019 FINDINGS: Lower chest: Mild dependent atelectasis of right lung base is identified. The heart size is normal. Hepatobiliary: Stable enhancing low-density lesion is identified in the posterior segment right lobe liver unchanged compared prior CT. The liver is otherwise. Gallstones in gallbladder. No inflammation is noted surrounding the gallbladder. The biliary tree is normal. Pancreas: Unremarkable. No pancreatic ductal dilatation or  surrounding inflammatory changes. Spleen: Normal in size without focal abnormality. Adrenals/Urinary Tract: 1 cm nodule is identified in the left adrenal gland unchanged. The right adrenal gland is normal. There is atrophy of the right kidney unchanged. There left kidney cyst. There is no hydronephrosis bilaterally. Foley catheter is identified in a decompressed bladder. Stomach/Bowel: There are multiple thick wall mild dilated small bowel loops in the mid and lower abdomen probably as a result of partial bowel obstruction due to herniated small bowel loops in the midline 10 cm anterior abdominal hernia. Colon is normal. The appendix is not seen but no inflammation is noted around cecum. Vascular/Lymphatic: Aortic atherosclerosis. No enlarged abdominal or pelvic lymph nodes. Reproductive: Enlarged prostate with calcifications are identified. Other: Midline anterior abdominal hernia containing small bowel are identified as previously described. Subcutaneous air is identified in the anterior right abdominal and pelvic subcutaneous fat, source indeterminate. Clinical correlation regarding recent injection in the area is suggested. There is no free air in the abdomen and pelvis. Musculoskeletal: Degenerative joint changes of the spine are noted. IMPRESSION: 1. Multiple thick wall mild dilated small bowel loops in the mid and lower abdomen probably as a result of partial bowel obstruction due to herniated small bowel loops in the midline anterior abdominal hernia. 2. Subcutaneous air is identified in the anterior right abdominal and pelvic subcutaneous fat, source indeterminate. Clinical correlation regarding recent injection in the area is suggested. There is no free air in the abdomen and pelvis. 3. Aortic atherosclerosis. These results were called by telephone at the time of interpretation on 12/26/2019 at 12:57 pm to provider [facility] , who verbally acknowledged these results. Aortic Atherosclerosis (ICD10-I70.0).  Electronically Signed   By: Abelardo Diesel M.D.   On: 12/26/2019 13:01   [place] EKG SITE RITE  Result Date: 12/27/2019 If Site Rite image not attached, placement could not be confirmed due to current cardiac rhythm.   Anti-infectives: Anti-infectives (From admission, onward)   Start     Dose/Rate Route Frequency Ordered Stop   12/28/19 1400  ceFEPIme (MAXIPIME) 2 g in sodium chloride 0.9 % 100 mL IVPB     Discontinue     2 g 200 mL/hr over 30 Minutes Intravenous Every 8 hours 12/28/19 0851     12/28/19 1200  vancomycin (VANCOREADY) IVPB 750 mg/150 mL     Discontinue     750 mg 150 mL/hr over 60 Minutes Intravenous Every 12 hours 12/28/19 0851     12/27/19 1200  vancomycin (VANCOCIN) IVPB 1000 mg/200 mL premix  Status:  Discontinued        1,000 mg 200 mL/hr over 60 Minutes Intravenous Every 24 hours 12/26/19 1649 12/28/19 0851   12/27/19 0600  ceFEPIme (MAXIPIME) 2 g in sodium chloride 0.9 % 100 mL IVPB  Status:  Discontinued        2 g 200 mL/hr over 30 Minutes Intravenous  Every 12 hours 12/26/19 1837 12/28/19 0851   12/27/19 0000  ceFEPIme (MAXIPIME) 2 g in sodium chloride 0.9 % 100 mL IVPB  Status:  Discontinued        2 g 200 mL/hr over 30 Minutes Intravenous  Once 12/26/19 1838 12/26/19 1842   12/26/19 2000  metroNIDAZOLE (FLAGYL) IVPB 500 mg     Discontinue     500 mg 100 mL/hr over 60 Minutes Intravenous Every 8 hours 12/26/19 1837     12/26/19 1930  ceFEPIme (MAXIPIME) 2 g in sodium chloride 0.9 % 100 mL IVPB  Status:  Discontinued        2 g 200 mL/hr over 30 Minutes Intravenous  Once 12/26/19 1842 12/26/19 1847   12/26/19 1900  ceFEPIme (MAXIPIME) 2 g in sodium chloride 0.9 % 100 mL IVPB        2 g 200 mL/hr over 30 Minutes Intravenous  Once 12/26/19 1847 12/26/19 1943   12/26/19 1115  piperacillin-tazobactam (ZOSYN) IVPB 3.375 g        3.375 g 100 mL/hr over 30 Minutes Intravenous  Once 12/26/19 1103 12/26/19 1159   12/26/19 1115  vancomycin (VANCOREADY) IVPB 1500 mg/300 mL         1,500 mg 150 mL/hr over 120 Minutes Intravenous  Once 12/26/19 1103 12/26/19 1428       Assessment/Plan Sepsis/neutropenia from chemotherapy -WBC 0.8>> 1.0>>2.0 Stage IIIa T2aN2 lung cancer Ongoing tobacco use Type 2 diabetes -A1c 6.1 Hypertension PVD Acute renal failure - Creatinine 0.91 today, improving Thrombocytopenia - platelets 130>>85>>60 Severe protein calorie malnutrition - prealbumin 9.9, TPN to start this evening   Abdominal pain  SBO vs enteritis by CT Reducible ventral hernia - patient having bowel function this AM - clamp NGT and trial CLD - if patient tolerates this, may be able to remove NGT later today - suspect this is probably enteritis from chemotherapy - hernia remains reducible - abdomen is tender but no indication for emergent surgical intervention   FEN: clamp NGT and trial CLD; TPN  ID: Zosyn 7/5 x 1; Maxipime/flagyl/vanc 7/5>> DVT: Lovenox Follow-up: TBD  LOS: 2 days    Norm Parcel , [facility] 12/28/2019, 9:42 AM Please see Amion for pager number during day hours 7:00am-4:30pm

## 2019-12-28 NOTE — Progress Notes (Signed)
PHARMACY - TOTAL PARENTERAL NUTRITION CONSULT NOTE   Indication: Small bowel obstruction  Patient Measurements: Height: 5\' 10"  (177.8 cm) Weight: 70.3 kg (155 lb) IBW/kg (Calculated) : 73 TPN AdjBW (KG): 70.3 Body mass index is 22.24 kg/m.  Assessment: Pt with pSBO with stage 3a NSLC and progressively worsening appetite, abdominal pain and weakness since chemo last week. Pt with NGT in place. Plan per [facility] is pt to have nothing PO for 2 weeks and start TPN  Glucose / Insulin: hx DM (on metformin PTA), CBGs at goal < 150, currently on SSI q4 Electrolytes: K low - IV KCL ordered per Md, phos and mag also low Renal: SCr 0.91 LFTs / TGs: LFTs WNL, TG 93 Prealbumin / albumin: Prealb 9.8, alb 2.2 Intake / Output; MIVF: will monitor after TPN started this PM GI Imaging: pSBO per CT Surgeries / Procedures: none  Central access: 7/6 PICC Line TPN start date: 7/7  Nutritional Goals (per RD recommendation on 7/6): kCal: [address], Protein: 110-125, Fluid: >= 2.1 L/day  Goal TPN rate is 90 mL/hr (provides 112 g of protein and 2199 kcals per day)  Current Nutrition:  NPO  Plan:  Give 2g Mag Sulfate x 1 this AM Will complete 4 runs IV KCL per Md orders Give 15 mMol Kphos x 1 this AM  Start TPN at 84mL/hr at 1800 Electrolytes in TPN: 70mEq/L of Na, 66mEq/L of K, 62mEq/L of Ca, 77mEq/L of Mg, and 67mmol/L of Phos. Cl:Ac ratio 1:1 Add standard MVI and trace elements to TPN Continue Sensitive q4h SSI and adjust as needed  Reduce MIVF to 60 mL/hr at 1800 Monitor TPN labs on Mon/Thurs  Kara Mead 12/28/2019,8:19 AM

## 2019-12-28 NOTE — Progress Notes (Signed)
PROGRESS NOTE  Chad Gomez  GBT:[id number] DOB: 09/02/1945 DOA: 12/26/2019 PCP: [name], MD   Brief Narrative: Chad Gomez is a 74 y.o. male with a history of stage IIIa NSCLC s/p lobectomy May 2021 having initiated chemotherapy June 2021, COPD, ongoing tobacco use, PAD s/p aorto-bifemoral bypass 2016, chronic foley catheterization, ventral hernia, T2DM, HTN, and HLD who presented to the ED 7/5 with abdominal pain, weakness, and limited per oral intake. In the ED temperature 99.65F, HR 100's, RR 20's/min. SCr 1.76 from normal baseline, lactic acid 3.4, pancytopenic with WBC 0.8, platelets 130. CXR normal. CT abdomen/pelvis showed multiple thick-walled mild dilated small bowel loops in the mid and lower abdomen probably as a result of partial bowel obstruction due to herniated small bowel loops in the midline anterior abdominal hernia. NG tube inserted, placed to suction, surgery consulted, and the patient was admitted under hospitalist service.  Assessment & Plan: Principal Problem:   SBO (small bowel obstruction) (HCC) Active Problems:   Atherosclerosis of native artery of both lower extremities with intermittent claudication (HCC)   S/P aortobifemoral bypass surgery   Centrilobular emphysema (HCC)   Stage 4 very severe COPD by GOLD classification ([place])   Tobacco abuse   Hypertension   Dyslipidemia   Type 2 diabetes mellitus with complication, without long-term current use of insulin (HCC)   Lung cancer, lingula (HCC)   Leukopenia due to antineoplastic chemotherapy (HCC)   Sepsis (HCC)   Ventral hernia   Dehydration   Lactic acidosis   Chronic indwelling Foley catheter   Acute lower UTI   Protein-calorie malnutrition, severe  Partial SBO:  - Appreciate general surgery recommendations, suspecting chemotherapy-induced enteritis.  - NG clamped this AM, pt with significant nausea and vomiting despite multiple IV antiemetics. Defer to surgery to restarted LIWS. Continue IVF  and NPO.  - TPN as below  Neutropenic fever with lactic acidosis, chemotherapy-induced pancytopenia: Lactate has cleared.  - Continue broad IV antibiotics w/vancomycin, cefepime, flagyl pending further culture data. Blood cultures NGTD. With bacteriuria and pyuria, urinary source is considered, though could also be incidental colonization. - Granix given 7/5, PMNs rising, ANC 1.3k on 7/7.  - Monitor CBC daily, thrombocytopenia noted, will hold pharmacologic DVT ppx in setting of declining plt's.   Ventral hernia: Reducible. No current plans for operative management.   Severe protein-calorie malnutrition:Prealbumin baseline 9.8. Albumin 2.2.  - With need for ongoing chemotherapy and pSBO, we have inserted PICC (double lumen right brachial 7/6) and will start TPN (7/7). Will monitor appropriate labs and supplement as indicated.   AKI: On what is presumably normal baseline CrCl. Improving w/IVF.  - Continue IVF until TPN started - Avoid nephrotoxins as able  Hypomagnesemia, hypophosphatemia, hypokalemia:  - Monitor daily, supplement by IV as needed  NSCLC of lingula stage IIIa s/p LUL resection 10/28/2019 and first Tx 12/19/2019 with pembrolizumab, pemetrexid, cisplatin:  - Not due for another treatment at this time.  Stage IV COPD, ongoing tobacco use:  - Nicotine patch, smoking cessation counseling provided.  - Continue home medications and prn BDs.   BPH, chronic urinary retention with indwelling foley catheterization:  - Restart home finasteride, flomax once able to take po.  - Change out foley catheter if last exchange was >1 month ago  PAD s/p bypass:  - Continue ASA, statin when able.   T2DM: Well-controlled with HbA1c 6.1%.  - SSI, holding metformin   HTN:  - Holding BP medications (norvasc, indapamide), not severe range, but may need IV  Tx if rises.  DVT prophylaxis: SCDs Code Status: Full Family Communication: None at bedside Disposition Plan:  Status is:  Inpatient  Remains inpatient appropriate because:IV treatments appropriate due to intensity of illness or inability to take PO   Dispo: The patient is from: Home              Anticipated d/c is to: TBD              Anticipated d/c date is: 3 days              Patient currently is not medically stable to d/c.  Consultants:   General surgery  Procedures:   None  Antimicrobials:  Vancomycin, cefepime, flagyl 7/5 >>    Subjective: Nausea with eructation since NG clamped, abdominal pain and with tenderness is stable. No fevers, no cough, no urinary pain.  Objective: Vitals:   12/27/19 2017 12/28/19 0500 12/28/19 0819 12/28/19 1709  BP: 115/75 131/84  (!) 146/84  Pulse: 85 77  77  Resp: (!) 22 20  18   Temp: 98.1 F (36.7 C) 97.9 F (36.6 C)  98.5 F (36.9 C)  TempSrc: Oral Oral  Oral  SpO2: 98% 98% 96% 97%  Weight:      Height:        Intake/Output Summary (Last 24 hours) at 12/28/2019 1814 Last data filed at 12/28/2019 1553 Gross per 24 hour  Intake 3161.61 ml  Output 1601 ml  Net 1560.61 ml   Filed Weights   12/26/19 1003 12/26/19 1756  Weight: 76.2 kg 70.3 kg    Gen: 74 y.o. male in no distress Pulm: Non-labored breathing, diminished on 2L O2, no wheezes.  CV: Regular rate and rhythm. No murmur, rub, or gallop. No JVD, no pitting pedal edema. GI: Abdomen soft, tender reducible ventral hernia, Hypoactive BS. Ext: Warm, no deformities Skin: No rashes, lesions or ulcers Neuro: Alert and oriented. No focal neurological deficits. Psych: Judgement and insight appear normal. Mood & affect appropriate.   Data Reviewed: I have personally reviewed following labs and imaging studies  CBC: Recent Labs  Lab 12/26/19 1004 12/27/19 0233 12/28/19 0354  WBC 0.8* 1.0* 2.0*  NEUTROABS 0.4*  --  1.3*  HGB 17.5* 14.4 12.5*  HCT 50.9 41.2 36.9*  MCV 90.1 90.0 91.3  PLT 130* 85* 60*   Basic Metabolic Panel: Recent Labs  Lab 12/26/19 1004 12/27/19 0233 12/28/19 0354   NA 136  --  140  K 4.2  --  2.6*  CL 91*  --  104  CO2 28  --  25  GLUCOSE 215*  --  93  BUN 47*  --  37*  CREATININE 1.76*  --  0.91  CALCIUM 10.5*  --  8.0*  MG  --  1.6* 1.6*  PHOS  --  2.5 1.7*   GFR: Estimated Creatinine Clearance: 73 mL/min (by C-G formula based on SCr of 0.91 mg/dL). Liver Function Tests: Recent Labs  Lab 12/26/19 1004 12/28/19 0354  AST 23 17  ALT 31 20  ALKPHOS 71 40  BILITOT 1.4* 0.7  PROT 7.1 4.5*  ALBUMIN 4.0 2.2*   Recent Labs  Lab 12/26/19 1004  LIPASE 18   No results for input(s): AMMONIA in the last 168 hours. Coagulation Profile: Recent Labs  Lab 12/26/19 1004  INR 1.1   Cardiac Enzymes: No results for input(s): CKTOTAL, CKMB, CKMBINDEX, TROPONINI in the last 168 hours. BNP (last 3 results) No results for input(s): PROBNP in the last  8760 hours. HbA1C: Recent Labs    12/26/19 1004  HGBA1C 6.1*   CBG: Recent Labs  Lab 12/28/19 0017 12/28/19 0455 12/28/19 0757 12/28/19 1150 12/28/19 1705  GLUCAP 103* 90 102* 99 135*   Lipid Profile: Recent Labs    12/28/19 0354  TRIG 93   Thyroid Function Tests: No results for input(s): TSH, T4TOTAL, FREET4, T3FREE, THYROIDAB in the last 72 hours. Anemia Panel: No results for input(s): VITAMINB12, FOLATE, FERRITIN, TIBC, IRON, RETICCTPCT in the last 72 hours. Urine analysis:    Component Value Date/Time   COLORURINE AMBER (A) 12/26/2019 1131   APPEARANCEUR HAZY (A) 12/26/2019 1131   LABSPEC 1.018 12/26/2019 1131   PHURINE 5.0 12/26/2019 1131   GLUCOSEU NEGATIVE 12/26/2019 1131   HGBUR MODERATE (A) 12/26/2019 1131   BILIRUBINUR NEGATIVE 12/26/2019 1131   KETONESUR 20 (A) 12/26/2019 1131   PROTEINUR 30 (A) 12/26/2019 1131   UROBILINOGEN 1.0 01/19/2015 1312   NITRITE NEGATIVE 12/26/2019 1131   LEUKOCYTESUR SMALL (A) 12/26/2019 1131   Recent Results (from the past 240 hour(s))  Culture, blood (Routine x 2)     Status: None (Preliminary result)   Collection Time:  12/26/19 10:04 AM   Specimen: BLOOD  Result Value Ref Range Status   Specimen Description   Final    BLOOD LEFT ANTECUBITAL Performed at [facility], [place] [address] [place], [place] [address]    Special Requests   Final    BOTTLES DRAWN AEROBIC AND ANAEROBIC Blood Culture results may not be optimal due to an inadequate volume of blood received in culture bottles Performed at [facility] [address], [place], [place] [address]    Culture   Final    NO GROWTH 2 DAYS Performed at [facility] [address] [place], [place] [address]    Report Status PENDING  Incomplete  Culture, blood (Routine x 2)     Status: None (Preliminary result)   Collection Time: 12/26/19 10:09 AM   Specimen: BLOOD  Result Value Ref Range Status   Specimen Description   Final    BLOOD RIGHT ANTECUBITAL Performed at [facility] [address], [place], [place] [address]    Special Requests   Final    BOTTLES DRAWN AEROBIC AND ANAEROBIC Blood Culture adequate volume Performed at [facility] [address], [place], [place] [address]    Culture   Final    NO GROWTH 2 DAYS Performed at [facility] [address] [place],  [address]    Report Status PENDING  Incomplete  SARS Coronavirus 2 by RT PCR (hospital order, performed in [facility] hospital lab) Nasopharyngeal Nasopharyngeal Swab     Status: None   Collection Time: 12/26/19 12:34 PM   Specimen: Nasopharyngeal Swab  Result Value Ref Range Status   SARS Coronavirus 2 NEGATIVE NEGATIVE Final    Comment: (NOTE) SARS-CoV-2 target nucleic acids are NOT DETECTED.  The SARS-CoV-2 RNA is generally detectable in upper and lower respiratory specimens during the acute phase of infection. The lowest concentration of SARS-CoV-2 viral copies this assay can detect is 250 copies / mL. A negative result does not preclude SARS-CoV-2 infection and should  not be used as the sole basis for treatment or other patient management decisions.  A negative result may occur with improper specimen collection / handling, submission of specimen other than nasopharyngeal swab, presence of viral mutation(s) within the areas targeted by this assay, and  inadequate number of viral copies (<250 copies / mL). A negative result must be combined with clinical observations, patient history, and epidemiological information.  Fact Sheet for Patients:   StrictlyIdeas.no  Fact Sheet for Healthcare Providers: BankingDealers.co.za  This test is not yet approved or  cleared by the [place] FDA and has been authorized for detection and/or diagnosis of SARS-CoV-2 by FDA under an Emergency Use Authorization (EUA).  This EUA will remain in effect (meaning this test can be used) for the duration of the COVID-19 declaration under Section 564(b)(1) of the Act, 21 U.S.C. section 360bbb-3(b)(1), unless the authorization is terminated or revoked sooner.  Performed at [facility], [place] [address] [place], [place] [address]       Radiology Studies: [place] EKG SITE RITE  Result Date: 12/27/2019 If Site Rite image not attached, placement could not be confirmed due to current cardiac rhythm.   Scheduled Meds: . arformoterol  15 mcg Nebulization BID  . Chlorhexidine Gluconate Cloth  6 each Topical Daily  . fluticasone  2 spray Each Nare Daily  . insulin aspart  0-9 Units Subcutaneous Q4H  . nicotine  21 mg Transdermal Daily  . nystatin  5 mL Oral QID  . pantoprazole (PROTONIX) IV  40 mg Intravenous Q24H  . sodium chloride flush  10-40 mL Intracatheter Q12H   Continuous Infusions: . sodium chloride 60 mL/hr at 12/28/19 1728  . ceFEPime (MAXIPIME) IV 2 g (12/28/19 1434)  . metronidazole 500 mg (12/28/19 1148)  . TPN ADULT (ION) 40 mL/hr at 12/28/19 1715  . vancomycin 750 mg (12/28/19 1304)     LOS:  2 days   Time spent: 35 minutes.  Patrecia Pour, MD [facility] [email or web address] 12/28/2019, 6:14 PM

## 2019-12-28 NOTE — Plan of Care (Signed)
  Problem: Education: Goal: Knowledge of General Education information will improve Description: Including pain rating scale, medication(s)/side effects and non-pharmacologic comfort measures Outcome: Progressing   Problem: Health Behavior/Discharge Planning: Goal: Ability to manage health-related needs will improve Outcome: Progressing   Problem: Clinical Measurements: Goal: Ability to maintain clinical measurements within normal limits will improve Outcome: Progressing Goal: Will remain free from infection Outcome: Progressing Goal: Diagnostic test results will improve Outcome: Progressing Goal: Respiratory complications will improve Outcome: Progressing Goal: Cardiovascular complication will be avoided Outcome: Progressing   Problem: Activity: Goal: Risk for activity intolerance will decrease Outcome: Progressing   Problem: Nutrition: Goal: Adequate nutrition will be maintained Outcome: Progressing   Problem: Coping: Goal: Level of anxiety will decrease Outcome: Progressing   Problem: Elimination: Goal: Will not experience complications related to bowel motility Outcome: Progressing Goal: Will not experience complications related to urinary retention Outcome: Progressing   Problem: Pain Managment: Goal: General experience of comfort will improve Outcome: Progressing   Problem: Safety: Goal: Ability to remain free from injury will improve Outcome: Progressing   Problem: Skin Integrity: Goal: Risk for impaired skin integrity will decrease Outcome: Progressing   Problem: Education: Goal: Knowledge of secondary prevention will improve Outcome: Progressing   Problem: Self-Care: Goal: Ability to communicate needs accurately will improve Outcome: Progressing   Problem: Nutrition: Goal: Risk of aspiration will decrease Outcome: Progressing

## 2019-12-29 LAB — COMPREHENSIVE METABOLIC PANEL
ALT: 17 U/L (ref 0–44)
AST: 16 U/L (ref 15–41)
Albumin: 2.4 g/dL — ABNORMAL LOW (ref 3.5–5.0)
Alkaline Phosphatase: 43 U/L (ref 38–126)
Anion gap: 9 (ref 5–15)
BUN: 28 mg/dL — ABNORMAL HIGH (ref 8–23)
CO2: 27 mmol/L (ref 22–32)
Calcium: 8 mg/dL — ABNORMAL LOW (ref 8.9–10.3)
Chloride: 106 mmol/L (ref 98–111)
Creatinine, Ser: 0.73 mg/dL (ref 0.61–1.24)
GFR calc Af Amer: >60 mL/min (ref >60)
GFR calc non Af Amer: >60 mL/min (ref >60)
Glucose, Bld: 148 mg/dL — ABNORMAL HIGH (ref 70–99)
Potassium: 2.3 mmol/L — CL (ref 3.5–5.1)
Sodium: 142 mmol/L (ref 135–145)
Total Bilirubin: 0.7 mg/dL (ref 0.3–1.2)
Total Protein: 4.5 g/dL — ABNORMAL LOW (ref 6.5–8.1)

## 2019-12-29 LAB — GLUCOSE, CAPILLARY
Glucose-Capillary: 120 mg/dL — ABNORMAL HIGH (ref 70–99)
Glucose-Capillary: 136 mg/dL — ABNORMAL HIGH (ref 70–99)
Glucose-Capillary: 139 mg/dL — ABNORMAL HIGH (ref 70–99)
Glucose-Capillary: 160 mg/dL — ABNORMAL HIGH (ref 70–99)
Glucose-Capillary: 160 mg/dL — ABNORMAL HIGH (ref 70–99)
Glucose-Capillary: 202 mg/dL — ABNORMAL HIGH (ref 70–99)

## 2019-12-29 LAB — MAGNESIUM: Magnesium: 1.7 mg/dL (ref 1.7–2.4)

## 2019-12-29 LAB — PHOSPHORUS: Phosphorus: 1.1 mg/dL — ABNORMAL LOW (ref 2.5–4.6)

## 2019-12-29 MED ORDER — POTASSIUM PHOSPHATES 15 MMOLE/5ML IV SOLN: 30.0000 mmol | Freq: Once | INTRAVENOUS | Status: DC

## 2019-12-29 MED ORDER — MAGNESIUM SULFATE 2 GM/50ML IV SOLN
2.0000 g | Freq: Once | INTRAVENOUS | Status: AC
  Administered 2019-12-29: 2 g via INTRAVENOUS
  Filled 2019-12-29: qty 50

## 2019-12-29 MED ORDER — POTASSIUM CHLORIDE 10 MEQ/100ML IV SOLN
10.0000 meq | INTRAVENOUS | Status: AC
  Administered 2019-12-29 (×5): 10 meq via INTRAVENOUS
  Filled 2019-12-29 (×5): qty 100

## 2019-12-29 MED ORDER — TRAVASOL 10 % IV SOLN
INTRAVENOUS | Status: AC
  Filled 2019-12-29: qty 547.2

## 2019-12-29 MED ORDER — POTASSIUM PHOSPHATES 15 MMOLE/5ML IV SOLN
30.0000 mmol | Freq: Once | INTRAVENOUS | Status: AC
  Administered 2019-12-29: 30 mmol via INTRAVENOUS
  Filled 2019-12-29: qty 10

## 2019-12-29 NOTE — Care Management Important Message (Signed)
Important Message  Patient Details IM Letter given to Gabriel Earing RN Case Manager to present to the Patient Name: Chad Gomez MRN: [id number] Date of Birth: 1946/03/05   Medicare Important Message Given:  Yes     Kerin Salen 12/29/2019, 11:57 AM

## 2019-12-29 NOTE — Progress Notes (Signed)
PROGRESS NOTE  Chad Gomez  CZY:[id number] DOB: 05-19-1946 DOA: 12/26/2019 PCP: [name], MD   Brief Narrative: Chad Gomez is a 74 y.o. male with a history of stage IIIa NSCLC s/p lobectomy May 2021 having initiated chemotherapy June 2021, COPD, ongoing tobacco use, PAD s/p aorto-bifemoral bypass 2016, chronic foley catheterization, ventral hernia, T2DM, HTN, and HLD who presented to the ED 7/5 with abdominal pain, weakness, and limited per oral intake. In the ED temperature 99.74F, HR 100's, RR 20's/min. SCr 1.76 from normal baseline, lactic acid 3.4, pancytopenic with WBC 0.8, platelets 130. CXR normal. CT abdomen/pelvis showed multiple thick-walled mild dilated small bowel loops in the mid and lower abdomen probably as a result of partial bowel obstruction due to herniated small bowel loops in the midline anterior abdominal hernia. NG tube inserted, placed to suction, surgery consulted, and the patient was admitted under hospitalist service. Failed clamping trials. TPN was recommended and was started 7/7 through PICC placed 7/6. Severe electrolyte derangements are being monitored and managed.  Assessment & Plan: Principal Problem:   SBO (small bowel obstruction) (HCC) Active Problems:   Atherosclerosis of native artery of both lower extremities with intermittent claudication (HCC)   S/P aortobifemoral bypass surgery   Centrilobular emphysema (HCC)   Stage 4 very severe COPD by GOLD classification ([place])   Tobacco abuse   Hypertension   Dyslipidemia   Type 2 diabetes mellitus with complication, without long-term current use of insulin (HCC)   Lung cancer, lingula (HCC)   Leukopenia due to antineoplastic chemotherapy (HCC)   Sepsis ([place])   Ventral hernia   Dehydration   Lactic acidosis   Chronic indwelling Foley catheter   Acute lower UTI   Protein-calorie malnutrition, severe  Ileus vs. partial SBO: Having bowel function.   - Appreciate general surgery recommendations,  suspecting chemotherapy-induced enteritis. - Repeat clamping trial this AM. Seems to be doing better. May be able to pull NGT later.  - Continue TPN until tolerating a significant diet.    Neutropenic fever with lactic acidosis, chemotherapy-induced pancytopenia: Lactate has cleared.  - Continue broad IV antibiotics w/vancomycin, cefepime, flagyl pending further culture data. Blood cultures remain NGTD. With bacteriuria and pyuria, urinary source is considered, though could also be incidental colonization. - Granix given 7/5, PMNs rising, ANC 1.3k on 7/7.  - Monitor CBC again 7/9. Holding pharmacologic DVT ppx with severe thrombocytopenia.  Ventral hernia: Reducible. No current plans for operative management.  Severe protein-calorie malnutrition:Prealbumin baseline 9.8. Albumin 2.2.  - With need for ongoing chemotherapy and pSBO, we have inserted PICC (double lumen right brachial 7/6) and started TPN (7/7). Will monitor appropriate labs and supplement as indicated.   AKI: On what is presumably normal baseline CrCl. Improving w/IVF.  - Continue low rate IVF until TPN at goal - Avoid nephrotoxins as able  Hypomagnesemia, hypophosphatemia, hypokalemia: Remains at very high risk of refeeding syndrome. - Monitor daily, supplementing by runs of Kphos and potassium and magnesium. Also supplementing in TPN. Will recheck this PM and modify management as indicated. D/w pharmacy.  NSCLC of lingula stage IIIa s/p LUL resection 10/28/2019 and first Tx 12/19/2019 with pembrolizumab, pemetrexid, cisplatin:  - Not due for another treatment at this time.  Stage IV COPD, ongoing tobacco use:  - Nicotine patch, smoking cessation counseling provided.  - Continue home medications and prn BDs.   BPH, chronic urinary retention with indwelling foley catheterization:  - Restart home finasteride, flomax once able to take po. - Change out foley  catheter if last exchange was >1 month ago  PAD s/p bypass:  -  Continue ASA, statin when able.   T2DM: Well-controlled with HbA1c 6.1%.  - SSI, holding metformin   HTN:  - Holding BP medications (norvasc, indapamide). At goal this PM.  DVT prophylaxis: SCDs Code Status: Full Family Communication: None at bedside Disposition Plan:  Status is: Inpatient  Remains inpatient appropriate because:IV treatments appropriate due to intensity of illness or inability to take PO   Dispo: The patient is from: Home              Anticipated d/c is to: Home. No PT follow up recommended. Has DME.              Anticipated d/c date is: 2 days              Patient currently is not medically stable to d/c.  Consultants:   General surgery  Pharmacy  Procedures:   None  Antimicrobials:  Vancomycin, cefepime, flagyl 7/5 >>    Subjective: No pain or vomiting since NG clamped this AM. Tolerating jello at this time. Denies any other symptoms, wants to go home ASAP. Still having BMs and flatus.   Objective: Vitals:   12/28/19 2039 12/29/19 0439 12/29/19 0855 12/29/19 1237  BP: 136/89 (!) 143/70  126/74  Pulse: 73 68  68  Resp: 16 16  17   Temp: 97.6 F (36.4 C) 97.6 F (36.4 C)  (!) 97.4 F (36.3 C)  TempSrc: Oral Oral  Oral  SpO2: 94% 100% 99% 95%  Weight:      Height:        Intake/Output Summary (Last 24 hours) at 12/29/2019 1555 Last data filed at 12/29/2019 1526 Gross per 24 hour  Intake 2451.97 ml  Output 3800 ml  Net -1348.03 ml   Filed Weights   12/26/19 1003 12/26/19 1756  Weight: 76.2 kg 70.3 kg   Gen: 74 y.o. male in no distress HEENT: NGT in left nare Pulm: Nonlabored breathing room air. Clear. CV: Regular rate and rhythm. No murmur, rub, or gallop. No JVD, no significant dependent edema. GI: Abdomen soft, non-tender, non-distended, with normoactive bowel sounds. Large ventral hernia left of midline prominent with cough. Reducible, nontender today (improved from prior exam). No suprapubic tenderness. Ext: Warm, no deformities.  PICC site c/d/i. Skin: No new rashes, lesions or ulcers on visualized skin. Neuro: Alert and oriented. No focal neurological deficits. Psych: Judgement and insight appear fair. Mood euthymic & affect congruent. Behavior is appropriate.    Data Reviewed: I have personally reviewed following labs and imaging studies  CBC: Recent Labs  Lab 12/26/19 1004 12/27/19 0233 12/28/19 0354  WBC 0.8* 1.0* 2.0*  NEUTROABS 0.4*  --  1.3*  HGB 17.5* 14.4 12.5*  HCT 50.9 41.2 36.9*  MCV 90.1 90.0 91.3  PLT 130* 85* 60*   Basic Metabolic Panel: Recent Labs  Lab 12/26/19 1004 12/27/19 0233 12/28/19 0354 12/29/19 0438  NA 136  --  140 142  K 4.2  --  2.6* 2.3*  CL 91*  --  104 106  CO2 28  --  25 27  GLUCOSE 215*  --  93 148*  BUN 47*  --  37* 28*  CREATININE 1.76*  --  0.91 0.73  CALCIUM 10.5*  --  8.0* 8.0*  MG  --  1.6* 1.6* 1.7  PHOS  --  2.5 1.7* 1.1*   GFR: Estimated Creatinine Clearance: 83 mL/min (by C-G formula based  on SCr of 0.73 mg/dL). Liver Function Tests: Recent Labs  Lab 12/26/19 1004 12/28/19 0354 12/29/19 0438  AST 23 17 16   ALT 31 20 17   ALKPHOS 71 40 43  BILITOT 1.4* 0.7 0.7  PROT 7.1 4.5* 4.5*  ALBUMIN 4.0 2.2* 2.4*   Recent Labs  Lab 12/26/19 1004  LIPASE 18   No results for input(s): AMMONIA in the last 168 hours. Coagulation Profile: Recent Labs  Lab 12/26/19 1004  INR 1.1   Cardiac Enzymes: No results for input(s): CKTOTAL, CKMB, CKMBINDEX, TROPONINI in the last 168 hours. BNP (last 3 results) No results for input(s): PROBNP in the last 8760 hours. HbA1C: No results for input(s): HGBA1C in the last 72 hours. CBG: Recent Labs  Lab 12/28/19 2037 12/28/19 2352 12/29/19 0435 12/29/19 0727 12/29/19 1202  GLUCAP 198* 177* 139* 136* 202*   Lipid Profile: Recent Labs    12/28/19 0354  TRIG 93   Thyroid Function Tests: No results for input(s): TSH, T4TOTAL, FREET4, T3FREE, THYROIDAB in the last 72 hours. Anemia Panel: No results  for input(s): VITAMINB12, FOLATE, FERRITIN, TIBC, IRON, RETICCTPCT in the last 72 hours. Urine analysis:    Component Value Date/Time   COLORURINE AMBER (A) 12/26/2019 1131   APPEARANCEUR HAZY (A) 12/26/2019 1131   LABSPEC 1.018 12/26/2019 1131   PHURINE 5.0 12/26/2019 1131   GLUCOSEU NEGATIVE 12/26/2019 1131   HGBUR MODERATE (A) 12/26/2019 1131   BILIRUBINUR NEGATIVE 12/26/2019 1131   KETONESUR 20 (A) 12/26/2019 1131   PROTEINUR 30 (A) 12/26/2019 1131   UROBILINOGEN 1.0 01/19/2015 1312   NITRITE NEGATIVE 12/26/2019 1131   LEUKOCYTESUR SMALL (A) 12/26/2019 1131   Recent Results (from the past 240 hour(s))  Culture, blood (Routine x 2)     Status: None (Preliminary result)   Collection Time: 12/26/19 10:04 AM   Specimen: BLOOD  Result Value Ref Range Status   Specimen Description   Final    BLOOD LEFT ANTECUBITAL Performed at [facility], [place] [address] [place], [place] [address]    Special Requests   Final    BOTTLES DRAWN AEROBIC AND ANAEROBIC Blood Culture results may not be optimal due to an inadequate volume of blood received in culture bottles Performed at [facility] [address], [place], [place] [address]    Culture   Final    NO GROWTH 3 DAYS Performed at [facility], [place] [address], [place], [place] [address]    Report Status PENDING  Incomplete  Culture, blood (Routine x 2)     Status: None (Preliminary result)   Collection Time: 12/26/19 10:09 AM   Specimen: BLOOD  Result Value Ref Range Status   Specimen Description   Final    BLOOD RIGHT ANTECUBITAL Performed at [facility] [address], [place], [place] [address]    Special Requests   Final    BOTTLES DRAWN AEROBIC AND ANAEROBIC Blood Culture adequate volume Performed at [facility] [address], [place], [place] [address]    Culture   Final    NO GROWTH 3 DAYS Performed at [facility], [place]  [address], [place], [place] [address]    Report Status PENDING  Incomplete  SARS Coronavirus 2 by RT PCR (hospital order, performed in [facility] hospital lab) Nasopharyngeal Nasopharyngeal Swab     Status: None   Collection Time: 12/26/19 12:34 PM   Specimen: Nasopharyngeal Swab  Result Value Ref Range Status  SARS Coronavirus 2 NEGATIVE NEGATIVE Final    Comment: (NOTE) SARS-CoV-2 target nucleic acids are NOT DETECTED.  The SARS-CoV-2 RNA is generally detectable in upper and lower respiratory specimens during the acute phase of infection. The lowest concentration of SARS-CoV-2 viral copies this assay can detect is 250 copies / mL. A negative result does not preclude SARS-CoV-2 infection and should not be used as the sole basis for treatment or other patient management decisions.  A negative result may occur with improper specimen collection / handling, submission of specimen other than nasopharyngeal swab, presence of viral mutation(s) within the areas targeted by this assay, and inadequate number of viral copies (<250 copies / mL). A negative result must be combined with clinical observations, patient history, and epidemiological information.  Fact Sheet for Patients:   StrictlyIdeas.no  Fact Sheet for Healthcare Providers: BankingDealers.co.za  This test is not yet approved or  cleared by the [place] FDA and has been authorized for detection and/or diagnosis of SARS-CoV-2 by FDA under an Emergency Use Authorization (EUA).  This EUA will remain in effect (meaning this test can be used) for the duration of the COVID-19 declaration under Section 564(b)(1) of the Act, 21 U.S.C. section 360bbb-3(b)(1), unless the authorization is terminated or revoked sooner.  Performed at [facility], [place] [address] [place], [place] [address]       Radiology Studies: No results found.  Scheduled Meds: . arformoterol  15  mcg Nebulization BID  . Chlorhexidine Gluconate Cloth  6 each Topical Daily  . fluticasone  2 spray Each Nare Daily  . insulin aspart  0-9 Units Subcutaneous Q4H  . nicotine  21 mg Transdermal Daily  . nystatin  5 mL Oral QID  . pantoprazole (PROTONIX) IV  40 mg Intravenous Q24H  . sodium chloride flush  10-40 mL Intracatheter Q12H   Continuous Infusions: . sodium chloride 60 mL/hr at 12/28/19 1958  . ceFEPime (MAXIPIME) IV 2 g (12/29/19 1358)  . metronidazole 500 mg (12/29/19 1115)  . potassium PHOSPHATE IVPB (in mmol) 30 mmol (12/29/19 1500)  . TPN ADULT (ION) 40 mL/hr at 12/28/19 1715  . TPN ADULT (ION)    . vancomycin 750 mg (12/29/19 1226)     LOS: 3 days   Time spent: 35 minutes.  Patrecia Pour, MD [facility] [email or web address] 12/29/2019, 3:55 PM

## 2019-12-29 NOTE — Progress Notes (Signed)
Patients NG tube clamped since around 9 a.m. with no nausea during the rest of 7 a to 7 p shift.  Tolerating clear liquids.  Ambulated in hallway entire length of unit.

## 2019-12-29 NOTE — Progress Notes (Addendum)
PHARMACY - TOTAL PARENTERAL NUTRITION CONSULT NOTE   Indication: Small bowel obstruction  Patient Measurements: Height: 5\' 10"  (177.8 cm) Weight: 70.3 kg (155 lb) IBW/kg (Calculated) : 73 TPN AdjBW (KG): 70.3 Body mass index is 22.24 kg/m.  Assessment: 36 y/oM with pSBO with stage 3a NSLC and progressively worsening appetite, abdominal pain and weakness since chemo last week. Patient with NGT in place-clamping NGT again today and trialing CLD. Pharmacy consulted to start TPN.    Glucose / Insulin: hx DM (on metformin PTA), CBGs 135-198 since starting TPN (goal < 150). Received 6 units SSI in past 24 hours.  Electrolytes: K+, Phos lower despite repletion yesterday. Mag on lower end of normal at 1.7 despite repletion yesterday. Goal K+ =/> 4 and Mag =/> 2. Others, including corrected calcium, WNL.  Renal: SCr 0.73 LFTs / TGs: LFTs WNL; TG 93 (7/7) Prealbumin / albumin: Prealbumin 9.8 (7/7); Albumin 2.4 Intake / Output; MIVF: I/O 1752/2100; NS at 53mL/hr  GI Imaging: pSBO per CT Surgeries / Procedures: none  Central access: 7/6 PICC Line TPN start date: 7/7  Nutritional Goals (per RD recommendation on 7/6): kCal: 2125-2320, Protein: 110-125 grams, Fluid: >= 2.1 L/day  Goal TPN rate is 90 mL/hr (provides 123g of protein and 2169 kcals per day)  Current Nutrition:  Clear liquid diet, TPN  Plan:  Now: KCl 24mEq IV x 5 runs already ordered by provider Mag Sulfate 2g IV x 1 KPhos 17mmol IV x 1   At 1800 today: Continue TPN at 49mL/hr (will not advance yet due to significant electrolyte abnormalities)  Electrolytes in TPN: 93mEq/L of Na, 39mEq/L of K, 55mEq/L of Ca, 45mEq/L of Mag, and 80mmol/L of Phos. Cl:Ac ratio 1:1 Add standard MVI and trace elements to TPN Continue Sensitive SSI q4h and adjust as needed  Continue MIVF of NS at 60 mL/hr  Monitor TPN labs on Mon/Thurs Check K+ this PM CMET, Mag, Phos in AM   Lindell Spar, PharmD, BCPS Clinical Pharmacist  12/29/2019,7:34  AM

## 2019-12-29 NOTE — Progress Notes (Signed)
[facility] Progress Note     Subjective: Patient is in much better spirits this AM. Had diarrhea all night and is passing more flatus this AM. Abdominal pain is no longer present at all. Hernia remains reducible. Patient did have some nausea yesterday evening and NGT was reconnected to suction. Patient very hopeful to be able to get NGT out today since his bowels seem to be working better.   Objective: Vital signs in last 24 hours: Temp:  [97.6 F (36.4 C)-98.5 F (36.9 C)] 97.6 F (36.4 C) (07/08 0439) Pulse Rate:  [68-77] 68 (07/08 0439) Resp:  [16-18] 16 (07/08 0439) BP: (136-146)/(70-89) 143/70 (07/08 0439) SpO2:  [94 %-100 %] 100 % (07/08 0439) Last BM Date: 12/29/19  Intake/Output from previous day: 07/07 0701 - 07/08 0700 In: 2968.6 [I.V.:1441.7; IV Piggyback:1526.9] Out: 3101 [Urine:2801; Emesis/NG output:300] Intake/Output this shift: No intake/output data recorded.  PE: General: WD, cachectic white male who is laying in bed in NAD HEENT: Sclera are noninjected.  PERRL.  Ears and nose without any masses or lesions.  Mouth is pink and moist Heart: regular, rate, and rhythm.  Normal s1,s2. No obvious murmurs, gallops, or rubs noted.  Palpable radial and pedal pulses bilaterally Lungs: Respiratory effort nonlabored, nasal cannula present Abd: soft, nontender, less distended, +BS, reducible ventral hernia, NGT with minimal drainage in tubing   Lab Results:  Recent Labs    12/27/19 0233 12/28/19 0354  WBC 1.0* 2.0*  HGB 14.4 12.5*  HCT 41.2 36.9*  PLT 85* 60*   BMET Recent Labs    12/28/19 0354 12/29/19 0438  NA 140 142  K 2.6* 2.3*  CL 104 106  CO2 25 27  GLUCOSE 93 148*  BUN 37* 28*  CREATININE 0.91 0.73  CALCIUM 8.0* 8.0*   PT/INR Recent Labs    12/26/19 1004  LABPROT 13.8  INR 1.1   CMP     Component Value Date/Time   NA 142 12/29/2019 0438   K 2.3 (LL) 12/29/2019 0438   CL 106 12/29/2019 0438   CO2 27 12/29/2019 0438    GLUCOSE 148 (H) 12/29/2019 0438   BUN 28 (H) 12/29/2019 0438   CREATININE 0.73 12/29/2019 0438   CREATININE 0.87 12/19/2019 0801   CALCIUM 8.0 (L) 12/29/2019 0438   PROT 4.5 (L) 12/29/2019 0438   ALBUMIN 2.4 (L) 12/29/2019 0438   AST 16 12/29/2019 0438   AST 14 (L) 12/19/2019 0801   ALT 17 12/29/2019 0438   ALT 20 12/19/2019 0801   ALKPHOS 43 12/29/2019 0438   BILITOT 0.7 12/29/2019 0438   BILITOT 0.6 12/19/2019 0801   GFRNONAA >60 12/29/2019 0438   GFRNONAA >60 12/19/2019 0801   GFRAA >60 12/29/2019 0438   GFRAA >60 12/19/2019 0801   Lipase     Component Value Date/Time   LIPASE 18 12/26/2019 1004       Studies/Results: [place] EKG SITE RITE  Result Date: 12/27/2019 If Site Rite image not attached, placement could not be confirmed due to current cardiac rhythm.   Anti-infectives: Anti-infectives (From admission, onward)   Start     Dose/Rate Route Frequency Ordered Stop   12/28/19 1400  ceFEPIme (MAXIPIME) 2 g in sodium chloride 0.9 % 100 mL IVPB     Discontinue     2 g 200 mL/hr over 30 Minutes Intravenous Every 8 hours 12/28/19 0851     12/28/19 1200  vancomycin (VANCOREADY) IVPB 750 mg/150 mL     Discontinue  750 mg 150 mL/hr over 60 Minutes Intravenous Every 12 hours 12/28/19 0851     12/27/19 1200  vancomycin (VANCOCIN) IVPB 1000 mg/200 mL premix  Status:  Discontinued        1,000 mg 200 mL/hr over 60 Minutes Intravenous Every 24 hours 12/26/19 1649 12/28/19 0851   12/27/19 0600  ceFEPIme (MAXIPIME) 2 g in sodium chloride 0.9 % 100 mL IVPB  Status:  Discontinued        2 g 200 mL/hr over 30 Minutes Intravenous Every 12 hours 12/26/19 1837 12/28/19 0851   12/27/19 0000  ceFEPIme (MAXIPIME) 2 g in sodium chloride 0.9 % 100 mL IVPB  Status:  Discontinued        2 g 200 mL/hr over 30 Minutes Intravenous  Once 12/26/19 1838 12/26/19 1842   12/26/19 2000  metroNIDAZOLE (FLAGYL) IVPB 500 mg     Discontinue     500 mg 100 mL/hr over 60 Minutes Intravenous Every 8  hours 12/26/19 1837     12/26/19 1930  ceFEPIme (MAXIPIME) 2 g in sodium chloride 0.9 % 100 mL IVPB  Status:  Discontinued        2 g 200 mL/hr over 30 Minutes Intravenous  Once 12/26/19 1842 12/26/19 1847   12/26/19 1900  ceFEPIme (MAXIPIME) 2 g in sodium chloride 0.9 % 100 mL IVPB        2 g 200 mL/hr over 30 Minutes Intravenous  Once 12/26/19 1847 12/26/19 1943   12/26/19 1115  piperacillin-tazobactam (ZOSYN) IVPB 3.375 g        3.375 g 100 mL/hr over 30 Minutes Intravenous  Once 12/26/19 1103 12/26/19 1159   12/26/19 1115  vancomycin (VANCOREADY) IVPB 1500 mg/300 mL        1,500 mg 150 mL/hr over 120 Minutes Intravenous  Once 12/26/19 1103 12/26/19 1428       Assessment/Plan Sepsis/neutropenia from chemotherapy -WBC 0.8>>1.0>>2.0 Stage IIIa T2aN2 lung cancer Ongoing tobacco use Type 2 diabetes-A1c 6.1 Hypertension PVD Acute renal failure - Creatinine 0.73 today, improving Thrombocytopenia - platelets 130>>85>>60 Severe protein calorie malnutrition - prealbumin 9.9, TPN started 7/7 Hypokalemia and hypophosphatemia - K 2.3 this AM, goal of 4.0 to maximize bowel function; Recommend Mg of >2; phos 1.1 this am, replacement per primary team   Abdominal pain  SBO vs enteritis by CT Reducible ventral hernia - patient having more bowel function this AM, pain has resolved - clamp NGT and trial CLD - if patient tolerates this, may be able to remove NGT later today; If patient does not tolerate clamping and clears today, will order small bowel protocol this afternoon  - suspect this is probably enteritis from chemotherapy - hernia remains reducible - no indication for emergent surgical intervention   FEN: clamp NGT and trial CLD; TPN  ID: Zosyn 7/5 x 1; Maxipime/flagyl/vanc 7/5>> DVT: Lovenox Follow-up: TBD  LOS: 3 days    Norm Parcel , [facility] 12/29/2019, 8:22 AM Please see Amion for pager number during day hours 7:00am-4:30pm

## 2019-12-29 NOTE — Plan of Care (Signed)
  Problem: Education: Goal: Knowledge of General Education information will improve Description: Including pain rating scale, medication(s)/side effects and non-pharmacologic comfort measures Outcome: Progressing   Problem: Health Behavior/Discharge Planning: Goal: Ability to manage health-related needs will improve Outcome: Progressing   Problem: Clinical Measurements: Goal: Ability to maintain clinical measurements within normal limits will improve Outcome: Progressing Goal: Will remain free from infection Outcome: Progressing Goal: Diagnostic test results will improve Outcome: Progressing Goal: Respiratory complications will improve Outcome: Progressing Goal: Cardiovascular complication will be avoided Outcome: Progressing   Problem: Activity: Goal: Risk for activity intolerance will decrease Outcome: Progressing   Problem: Nutrition: Goal: Adequate nutrition will be maintained Outcome: Progressing   Problem: Coping: Goal: Level of anxiety will decrease Outcome: Progressing   Problem: Elimination: Goal: Will not experience complications related to bowel motility Outcome: Progressing Goal: Will not experience complications related to urinary retention Outcome: Progressing   Problem: Pain Managment: Goal: General experience of comfort will improve Outcome: Progressing   Problem: Safety: Goal: Ability to remain free from injury will improve Outcome: Progressing   Problem: Skin Integrity: Goal: Risk for impaired skin integrity will decrease Outcome: Progressing   Problem: Education: Goal: Knowledge of secondary prevention will improve Outcome: Progressing   Problem: Self-Care: Goal: Ability to communicate needs accurately will improve Outcome: Progressing   Problem: Nutrition: Goal: Risk of aspiration will decrease Outcome: Progressing

## 2019-12-29 NOTE — TOC Progression Note (Signed)
Transition of Care [facility]) - Progression Note    Patient Details  Name: Chad Gomez MRN: [id number] Date of Birth: January 17, 1946  [facility] CM/SW Contact  Purcell Mouton, RN Phone Number: 12/29/2019, 12:12 PM  Clinical Narrative:    Will continue to follow up with pt on [facility]. Pt could not decide on HH at this time.   Expected Discharge Plan: Home/Self Care Barriers to Discharge: No Barriers Identified  Expected Discharge Plan and Services Expected Discharge Plan: Home/Self Care       Living arrangements for the past 2 months: Single Family Home                                       Social Determinants of Health (SDOH) Interventions    Readmission Risk Interventions No flowsheet data found.

## 2019-12-30 LAB — COMPREHENSIVE METABOLIC PANEL
ALT: 18 U/L (ref 0–44)
AST: 20 U/L (ref 15–41)
Albumin: 2.3 g/dL — ABNORMAL LOW (ref 3.5–5.0)
Alkaline Phosphatase: 44 U/L (ref 38–126)
Anion gap: 6 (ref 5–15)
BUN: 17 mg/dL (ref 8–23)
CO2: 27 mmol/L (ref 22–32)
Calcium: 8 mg/dL — ABNORMAL LOW (ref 8.9–10.3)
Chloride: 106 mmol/L (ref 98–111)
Creatinine, Ser: 0.66 mg/dL (ref 0.61–1.24)
GFR calc Af Amer: >60 mL/min (ref >60)
GFR calc non Af Amer: >60 mL/min (ref >60)
Glucose, Bld: 131 mg/dL — ABNORMAL HIGH (ref 70–99)
Potassium: 2.8 mmol/L — ABNORMAL LOW (ref 3.5–5.1)
Sodium: 139 mmol/L (ref 135–145)
Total Bilirubin: 0.7 mg/dL (ref 0.3–1.2)
Total Protein: 4.4 g/dL — ABNORMAL LOW (ref 6.5–8.1)

## 2019-12-30 LAB — GLUCOSE, CAPILLARY
Glucose-Capillary: 135 mg/dL — ABNORMAL HIGH (ref 70–99)
Glucose-Capillary: 119 mg/dL — ABNORMAL HIGH (ref 70–99)
Glucose-Capillary: 166 mg/dL — ABNORMAL HIGH (ref 70–99)
Glucose-Capillary: 169 mg/dL — ABNORMAL HIGH (ref 70–99)
Glucose-Capillary: 120 mg/dL — ABNORMAL HIGH (ref 70–99)

## 2019-12-30 LAB — CBC
HCT: 34 % — ABNORMAL LOW (ref 39.0–52.0)
Hemoglobin: 11.8 g/dL — ABNORMAL LOW (ref 13.0–17.0)
MCH: 31.2 pg (ref 26.0–34.0)
MCHC: 34.7 g/dL (ref 30.0–36.0)
MCV: 89.9 fL (ref 80.0–100.0)
Platelets: 67 10*3/uL — ABNORMAL LOW (ref 150–400)
RBC: 3.78 MIL/uL — ABNORMAL LOW (ref 4.22–5.81)
RDW: 12.6 % (ref 11.5–15.5)
WBC: 2.6 10*3/uL — ABNORMAL LOW (ref 4.0–10.5)
nRBC: 0 % (ref 0.0–0.2)

## 2019-12-30 LAB — POTASSIUM: Potassium: 2.6 mmol/L — CL (ref 3.5–5.1)

## 2019-12-30 LAB — PHOSPHORUS: Phosphorus: 1.9 mg/dL — ABNORMAL LOW (ref 2.5–4.6)

## 2019-12-30 LAB — MAGNESIUM: Magnesium: 1.5 mg/dL — ABNORMAL LOW (ref 1.7–2.4)

## 2019-12-30 MED ORDER — ACETAMINOPHEN 325 MG PO TABS
650.0000 mg | ORAL_TABLET | ORAL | Status: DC | PRN
  Administered 2019-12-30 (×2): 650 mg via ORAL
  Filled 2019-12-30 (×3): qty 2

## 2019-12-30 MED ORDER — EZETIMIBE 10 MG PO TABS
5.0000 mg | ORAL_TABLET | Freq: Every day | ORAL | Status: DC
  Administered 2019-12-30 – 2020-01-02 (×4): 5 mg via ORAL
  Filled 2019-12-30 (×4): qty 1

## 2019-12-30 MED ORDER — TAMSULOSIN HCL 0.4 MG PO CAPS
0.4000 mg | ORAL_CAPSULE | Freq: Every day | ORAL | Status: DC
  Administered 2019-12-30 – 2020-01-01 (×3): 0.4 mg via ORAL
  Filled 2019-12-30 (×3): qty 1

## 2019-12-30 MED ORDER — POTASSIUM CHLORIDE 10 MEQ/100ML IV SOLN
10.0000 meq | INTRAVENOUS | Status: AC
  Administered 2019-12-30 (×5): 10 meq via INTRAVENOUS
  Filled 2019-12-30 (×5): qty 100

## 2019-12-30 MED ORDER — ATORVASTATIN CALCIUM 40 MG PO TABS
40.0000 mg | ORAL_TABLET | Freq: Every day | ORAL | Status: DC
  Administered 2019-12-30 – 2020-01-02 (×4): 40 mg via ORAL
  Filled 2019-12-30 (×4): qty 1

## 2019-12-30 MED ORDER — TRAVASOL 10 % IV SOLN
INTRAVENOUS | Status: AC
  Filled 2019-12-30: qty 547.2

## 2019-12-30 MED ORDER — FINASTERIDE 5 MG PO TABS
5.0000 mg | ORAL_TABLET | Freq: Every day | ORAL | Status: DC
  Administered 2019-12-30 – 2020-01-02 (×4): 5 mg via ORAL
  Filled 2019-12-30 (×4): qty 1

## 2019-12-30 MED ORDER — MAGNESIUM SULFATE 4 GM/100ML IV SOLN
4.0000 g | INTRAVENOUS | Status: AC
  Administered 2019-12-30: 4 g via INTRAVENOUS
  Filled 2019-12-30 (×2): qty 100

## 2019-12-30 MED ORDER — POTASSIUM CHLORIDE 10 MEQ/100ML IV SOLN
10.0000 meq | INTRAVENOUS | Status: AC
  Administered 2019-12-30 (×4): 10 meq via INTRAVENOUS
  Filled 2019-12-30 (×4): qty 100

## 2019-12-30 MED ORDER — POTASSIUM CHLORIDE IN NACL 20-0.9 MEQ/L-% IV SOLN
INTRAVENOUS | Status: DC
  Filled 2019-12-30 (×5): qty 1000

## 2019-12-30 MED ORDER — K PHOS MONO-SOD PHOS DI & MONO 155-852-130 MG PO TABS
250.0000 mg | ORAL_TABLET | Freq: Three times a day (TID) | ORAL | Status: DC
  Filled 2019-12-30: qty 1

## 2019-12-30 MED ORDER — AMLODIPINE BESYLATE 10 MG PO TABS
10.0000 mg | ORAL_TABLET | Freq: Every day | ORAL | Status: DC
  Administered 2019-12-30 – 2020-01-02 (×4): 10 mg via ORAL
  Filled 2019-12-30 (×4): qty 1

## 2019-12-30 MED ORDER — POTASSIUM PHOSPHATES 15 MMOLE/5ML IV SOLN
30.0000 mmol | Freq: Once | INTRAVENOUS | Status: AC
  Administered 2019-12-30: 30 mmol via INTRAVENOUS
  Filled 2019-12-30: qty 10

## 2019-12-30 NOTE — Plan of Care (Signed)
  Problem: Education: Goal: Knowledge of General Education information will improve Description: Including pain rating scale, medication(s)/side effects and non-pharmacologic comfort measures Outcome: Progressing   Problem: Health Behavior/Discharge Planning: Goal: Ability to manage health-related needs will improve Outcome: Progressing   Problem: Clinical Measurements: Goal: Ability to maintain clinical measurements within normal limits will improve Outcome: Progressing Goal: Will remain free from infection Outcome: Progressing Goal: Diagnostic test results will improve Outcome: Progressing Goal: Respiratory complications will improve Outcome: Progressing Goal: Cardiovascular complication will be avoided Outcome: Progressing   Problem: Activity: Goal: Risk for activity intolerance will decrease Outcome: Progressing   Problem: Nutrition: Goal: Adequate nutrition will be maintained Outcome: Progressing   Problem: Coping: Goal: Level of anxiety will decrease Outcome: Progressing   Problem: Elimination: Goal: Will not experience complications related to bowel motility Outcome: Progressing Goal: Will not experience complications related to urinary retention Outcome: Progressing   Problem: Pain Managment: Goal: General experience of comfort will improve Outcome: Progressing   Problem: Safety: Goal: Ability to remain free from injury will improve Outcome: Progressing   Problem: Skin Integrity: Goal: Risk for impaired skin integrity will decrease Outcome: Progressing   Problem: Education: Goal: Knowledge of secondary prevention will improve Outcome: Progressing   Problem: Self-Care: Goal: Ability to communicate needs accurately will improve Outcome: Progressing   Problem: Nutrition: Goal: Risk of aspiration will decrease Outcome: Progressing

## 2019-12-30 NOTE — Progress Notes (Addendum)
PHARMACY - TOTAL PARENTERAL NUTRITION CONSULT NOTE   Indication: partial small bowel obstruction vs. Ileus   Patient Measurements: Height: 5\' 10"  (177.8 cm) Weight: 70.3 kg (155 lb) IBW/kg (Calculated) : 73 TPN AdjBW (KG): 70.3 Body mass index is 22.24 kg/m.  Assessment: 25 y/oM with pSBO with stage 3a NSLC and progressively worsening appetite, abdominal pain and weakness since chemo last week. Pharmacy consulted to start TPN. NGT clamped with no nausea reported. Tolerating clear liquids.   Glucose / Insulin: hx DM (on metformin PTA), CBGs 119-202 (goal < 150). Received 9 units SSI in past 24 hours.  Electrolytes: K+, Phos remain low but trending up after aggressive repletion. Mag low and decreased to 1.5 despite repletion yesterday. Goal K+ =/> 4 and Mag =/> 2. Others, including corrected calcium, WNL.  Renal: SCr 0.66, WNL LFTs / TGs: LFTs WNL; TG 93 (7/7) Prealbumin / albumin: Prealbumin 9.8 (7/7); Albumin 2.3 Intake / Output; MIVF: I/O 3612/3500; NS at 35mL/hr  GI Imaging: pSBO per CT Surgeries / Procedures: none  Central access: 7/6 PICC Line TPN start date: 7/7  Nutritional Goals (per RD recommendation on 7/6): kCal: 2125-2320, Protein: 110-125 grams, Fluid: >= 2.1 L/day  Goal TPN rate is 90 mL/hr (provides 123g of protein and 2169 kcals per day)  Current Nutrition:  Clear liquid diet --> advanced to full liquids, TPN  Plan:  Now: KCl 34mEq IV x 5 runs ordered last night for K+ 2.6. 2 runs infused prior to labs drawn this AM. Additional 4 runs ordered by provider for K+ 2.8.  Mag Sulfate 4g IV x 1 KPhos 30mmol IV x 1  Change maintenance IVF from NS to NS with 27mEq/L KCl at 38mL/hr.   At 1800 today: Continue TPN at 63mL/hr (will not advance yet due to significant electrolyte abnormalities)  Electrolytes in TPN: 69mEq/L of Na, 89mEq/L of K, 77mEq/L of Ca, 52mEq/L of Mag, and 71mmol/L of Phos. Cl:Ac ratio 1:1 Add standard MVI and trace elements to TPN Continue  Sensitive SSI q4h and adjust as needed  Continue MIVF at 60 mL/hr  Monitor TPN labs on Mon/Thurs BMET, Mag, Phos in AM F/u ability to tolerate diet    Lindell Spar, PharmD, BCPS Clinical Pharmacist  12/30/2019,10:26 AM

## 2019-12-30 NOTE — Progress Notes (Signed)
[facility] Progress Note     Subjective: No abdominal pain. Tolerating CLD.   Objective: Vital signs in last 24 hours: Temp:  [97.4 F (36.3 C)-98.6 F (37 C)] 98.6 F (37 C) (07/09 0418) Pulse Rate:  [68-77] 71 (07/09 0418) Resp:  [17-20] 20 (07/09 0418) BP: (126-139)/(72-93) 138/93 (07/09 0418) SpO2:  [91 %-97 %] 91 % (07/09 0923) Last BM Date: 12/29/19  Intake/Output from previous day: 07/08 0701 - 07/09 0700 In: 3612.2 [P.O.:360; I.V.:1792.7; IV Piggyback:1459.6] Out: 3500 [Urine:3200; Emesis/NG output:300] Intake/Output this shift: No intake/output data recorded.  PE: General: WD,cachecticwhite male who is laying in bed in NAD HEENT: Sclera are noninjected. PERRL. Ears and nose without any masses or lesions. Mouth is pink and moist Heart: regular, rate, and rhythm. Normal s1,s2. No obvious murmurs, gallops, or rubs noted. Palpable radial and pedal pulses bilaterally Lungs: Respiratory effort nonlabored, nasal cannula present Abd: soft,nontender, lessdistended, +BS,reducible ventral hernia, NGT with minimal drainage in tubing   Lab Results:  Recent Labs    12/28/19 0354 12/30/19 0354  WBC 2.0* 2.6*  HGB 12.5* 11.8*  HCT 36.9* 34.0*  PLT 60* 67*   BMET Recent Labs    12/29/19 0438 12/29/19 0438 12/29/19 2355 12/30/19 0354  NA 142  --   --  139  K 2.3*   < > 2.6* 2.8*  CL 106  --   --  106  CO2 27  --   --  27  GLUCOSE 148*  --   --  131*  BUN 28*  --   --  17  CREATININE 0.73  --   --  0.66  CALCIUM 8.0*  --   --  8.0*   < > = values in this interval not displayed.   PT/INR No results for input(s): LABPROT, INR in the last 72 hours. CMP     Component Value Date/Time   NA 139 12/30/2019 0354   K 2.8 (L) 12/30/2019 0354   CL 106 12/30/2019 0354   CO2 27 12/30/2019 0354   GLUCOSE 131 (H) 12/30/2019 0354   BUN 17 12/30/2019 0354   CREATININE 0.66 12/30/2019 0354   CREATININE 0.87 12/19/2019 0801   CALCIUM 8.0 (L)  12/30/2019 0354   PROT 4.4 (L) 12/30/2019 0354   ALBUMIN 2.3 (L) 12/30/2019 0354   AST 20 12/30/2019 0354   AST 14 (L) 12/19/2019 0801   ALT 18 12/30/2019 0354   ALT 20 12/19/2019 0801   ALKPHOS 44 12/30/2019 0354   BILITOT 0.7 12/30/2019 0354   BILITOT 0.6 12/19/2019 0801   GFRNONAA >60 12/30/2019 0354   GFRNONAA >60 12/19/2019 0801   GFRAA >60 12/30/2019 0354   GFRAA >60 12/19/2019 0801   Lipase     Component Value Date/Time   LIPASE 18 12/26/2019 1004       Studies/Results: No results found.  Anti-infectives: Anti-infectives (From admission, onward)   Start     Dose/Rate Route Frequency Ordered Stop   12/28/19 1400  ceFEPIme (MAXIPIME) 2 g in sodium chloride 0.9 % 100 mL IVPB     Discontinue     2 g 200 mL/hr over 30 Minutes Intravenous Every 8 hours 12/28/19 0851     12/28/19 1200  vancomycin (VANCOREADY) IVPB 750 mg/150 mL     Discontinue     750 mg 150 mL/hr over 60 Minutes Intravenous Every 12 hours 12/28/19 0851     12/27/19 1200  vancomycin (VANCOCIN) IVPB 1000 mg/200 mL premix  Status:  Discontinued  1,000 mg 200 mL/hr over 60 Minutes Intravenous Every 24 hours 12/26/19 1649 12/28/19 0851   12/27/19 0600  ceFEPIme (MAXIPIME) 2 g in sodium chloride 0.9 % 100 mL IVPB  Status:  Discontinued        2 g 200 mL/hr over 30 Minutes Intravenous Every 12 hours 12/26/19 1837 12/28/19 0851   12/27/19 0000  ceFEPIme (MAXIPIME) 2 g in sodium chloride 0.9 % 100 mL IVPB  Status:  Discontinued        2 g 200 mL/hr over 30 Minutes Intravenous  Once 12/26/19 1838 12/26/19 1842   12/26/19 2000  metroNIDAZOLE (FLAGYL) IVPB 500 mg     Discontinue     500 mg 100 mL/hr over 60 Minutes Intravenous Every 8 hours 12/26/19 1837     12/26/19 1930  ceFEPIme (MAXIPIME) 2 g in sodium chloride 0.9 % 100 mL IVPB  Status:  Discontinued        2 g 200 mL/hr over 30 Minutes Intravenous  Once 12/26/19 1842 12/26/19 1847   12/26/19 1900  ceFEPIme (MAXIPIME) 2 g in sodium chloride 0.9 %  100 mL IVPB        2 g 200 mL/hr over 30 Minutes Intravenous  Once 12/26/19 1847 12/26/19 1943   12/26/19 1115  piperacillin-tazobactam (ZOSYN) IVPB 3.375 g        3.375 g 100 mL/hr over 30 Minutes Intravenous  Once 12/26/19 1103 12/26/19 1159   12/26/19 1115  vancomycin (VANCOREADY) IVPB 1500 mg/300 mL        1,500 mg 150 mL/hr over 120 Minutes Intravenous  Once 12/26/19 1103 12/26/19 1428       Assessment/Plan Sepsis/neutropenia from chemotherapy -WBC 0.8>>1.0>>2.0>>2.6 Stage IIIa T2aN2 lung cancer Ongoing tobacco use Type 2 diabetes-A1c 6.1 Hypertension PVD Acute renal failure - Creatinine0.66 today, improving Thrombocytopenia - platelets 130>>85>>60>>67 Severe protein calorie malnutrition - prealbumin 9.9 Hypokalemia and hypophosphatemia - K 2.8 this AM, goal of 4.0 to maximize bowel function; Recommend Mg of >2; phos 1.9 this am, replacement per primary team   Abdominal pain SBOvsenteritis by CT Reducible ventral hernia - removed NGT and advanced to FLD - I think if patient can tolerate FLD, can d/c TPN prior to discharge - suspect this is probably enteritis from chemotherapy - hernia remains reducible - no indication for surgical intervention, we will sign off - please call if we can be of further assistance   FEN:FLD; TPN ID: Zosyn 7/5 x 1; Maxipime/flagyl/vanc 7/5>> DVT: Lovenox Follow-up: TBD  LOS: 4 days    Norm Parcel , [facility] 12/30/2019, 10:46 AM Please see Amion for pager number during day hours 7:00am-4:30pm

## 2019-12-30 NOTE — Progress Notes (Signed)
Brief Pharmacy Note re: potassium  Repeat K this evening 2.6 after 17mEq IV KCl x5 & 62mMol Kphos IV today. K in TPN only increased slightly with new bag hung at 6pm (~28mEq over 24h).  Repeat K runs x5 now Repeat K, Mag, Phos ordered with am labs.  Netta Cedars, PharmD, BCPS 12/30/2019@12 :53 AM

## 2019-12-30 NOTE — Progress Notes (Signed)
PROGRESS NOTE  [name]  JEH:[id number] DOB: 1945-07-07 DOA: 12/26/2019 PCP: [name], MD   Brief Narrative: Chad Gomez is a 74 y.o. male with a history of stage IIIa NSCLC s/p lobectomy May 2021 having initiated chemotherapy June 2021, COPD, ongoing tobacco use, PAD s/p aorto-bifemoral bypass 2016, chronic foley catheterization, ventral hernia, T2DM, HTN, and HLD who presented to the ED 7/5 with abdominal pain, weakness, and limited per oral intake. In the ED temperature 99.59F, HR 100's, RR 20's/min. SCr 1.76 from normal baseline, lactic acid 3.4, pancytopenic with WBC 0.8, platelets 130. CXR normal. CT abdomen/pelvis showed multiple thick-walled mild dilated small bowel loops in the mid and lower abdomen probably as a result of partial bowel obstruction due to herniated small bowel loops in the midline anterior abdominal hernia. NG tube inserted, placed to suction, surgery consulted, and the patient was admitted under hospitalist service. Failed clamping trials. TPN was recommended and was started 7/7 through PICC placed 7/6. Severe electrolyte derangements are being monitored and managed.  Assessment & Plan: Principal Problem:   SBO (small bowel obstruction) (HCC) Active Problems:   Atherosclerosis of native artery of both lower extremities with intermittent claudication (HCC)   S/P aortobifemoral bypass surgery   Centrilobular emphysema (HCC)   Stage 4 very severe COPD by GOLD classification ([place])   Tobacco abuse   Hypertension   Dyslipidemia   Type 2 diabetes mellitus with complication, without long-term current use of insulin (HCC)   Lung cancer, lingula (HCC)   Leukopenia due to antineoplastic chemotherapy (HCC)   Sepsis ([place])   Ventral hernia   Dehydration   Lactic acidosis   Chronic indwelling Foley catheter   Acute lower UTI   Protein-calorie malnutrition, severe  Ileus vs. partial SBO: Having bowel function.   - Appreciate general surgery recommendations,  suspecting chemotherapy-induced enteritis. - Pulled NGT, will advance to full liquids today. If tolerates, start soft diet cautiously.   - Continue TPN until tolerating a significant diet.    Neutropenic fever with lactic acidosis, chemotherapy-induced pancytopenia, and sepsis due to enteritis: Lactate has cleared.  - Continue broad IV antibiotics w/vancomycin, cefepime, flagyl pending further culture data. Since blood cultures remain NGTD, can DC MRSA coverage with no pulmonary or skin source. Will narrow as able. With bacteriuria and pyuria, urinary source is considered, though could also be incidental colonization. Will exchange foley catheter. - Granix given 7/5  - Monitor CBC. Holding pharmacologic DVT ppx with severe thrombocytopenia.  Ventral hernia: Reducible. No current plans for operative management.  Severe protein-calorie malnutrition:Prealbumin baseline 9.8. Albumin 2.2.  - With need for ongoing chemotherapy and pSBO, we have inserted PICC (double lumen right brachial 7/6) and started TPN (7/7). Will monitor appropriate labs and supplement as indicated. If po intake improves, we can DC TPN but would have low threshold to use this if he has a recurrent episode.  AKI: On what is presumably normal baseline CrCl. Improving w/IVF, does not appear to have CKD.  - Continuing low rate IVF until TPN at goal, potentially 7/10.  - Avoid nephrotoxins as able  Hypomagnesemia, hypophosphatemia, hypokalemia: Remains at very high risk of refeeding syndrome. Overall improving trends with supplementation. - Monitor daily, continuing aggressive supplementation and intensive monitoring. See orders, though we are supplementing all through various routes.    NSCLC of lingula stage IIIa s/p LUL resection 10/28/2019 and first Tx 12/19/2019 with pembrolizumab, pemetrexid, cisplatin:  - Not due for another treatment at this time.  Stage IV COPD, ongoing  tobacco use:  - Nicotine patch, smoking cessation  counseling provided.  - Continue home medications and prn BDs.   BPH, chronic urinary retention with indwelling foley catheterization:  - Restart home finasteride, flomax  - With history of urolithiasis, will restart K-citrate once no longer requiring intensive electrolyte supplementation. - Change out foley catheter 12/30/2019  PAD s/p bypass:  - Continue statin, will hold aspirin while platelets low.  T2DM: Well-controlled with HbA1c 6.1%.  - SSI, holding metformin   HTN:  - Can restart BP medications, will start with norvasc and hold indapamide in setting of metabolic derangements as above.    Thrush: Improving - Continue nystatin  DVT prophylaxis: SCDs Code Status: Full Family Communication: None at bedside Disposition Plan:  Status is: Inpatient  Remains inpatient appropriate because:IV treatments appropriate due to intensity of illness or inability to take PO, advancing diet to fulls. Has not proven ability to take sufficient nutrition po. If able to take adequate po, can DC in next 24-48hrs.   Dispo: The patient is from: Home              Anticipated d/c is to: Home. No PT follow up recommended. Has DME.              Anticipated d/c date is: 24-48hrs              Patient currently is not medically stable to d/c.  Consultants:   General surgery  Pharmacy  Procedures:   None  Antimicrobials:  Vancomycin 7/5 - 7/9  Cefepime, flagyl 7/5 >>    Subjective: Pulled NGT, still no abdominal pain reported, when asked again he does state there is some diffuse soreness that is mild. He has no problem taking clear liquids and is continuing to advance slowly. Ambulating regularly. No fevers or other complaints.   Objective: Vitals:   12/29/19 2016 12/29/19 2021 12/30/19 0418 12/30/19 0923  BP: 139/72  (!) 138/93   Pulse: 77  71   Resp: 20  20   Temp: 98.6 F (37 C)  98.6 F (37 C)   TempSrc: Oral  Oral   SpO2: 97% 96% 95% 91%  Weight:      Height:         Intake/Output Summary (Last 24 hours) at 12/30/2019 1720 Last data filed at 12/30/2019 1630 Gross per 24 hour  Intake 2770.24 ml  Output 3400 ml  Net -629.76 ml   Filed Weights   12/26/19 1003 12/26/19 1756  Weight: 76.2 kg 70.3 kg   Gen: 74 y.o. male in no distress HEENT: Oropharynx irritated without leukoplakia. Pulm: Nonlabored breathing room air. Clear. CV: Regular rate and rhythm. No murmur, rub, or gallop. No JVD, no significant dependent edema. GI: Abdomen soft, not significantly tender, non-distended, with normoactive bowel sounds. Ventral hernia reducible. Ext: Warm, no deformities Skin: No rashes, lesions or ulcers on visualized skin. Neuro: Alert and oriented. No focal neurological deficits. Psych: Judgement and insight appear fair. Mood euthymic & affect congruent. Behavior is appropriate.    Data Reviewed: I have personally reviewed following labs and imaging studies  CBC: Recent Labs  Lab 12/26/19 1004 12/27/19 0233 12/28/19 0354 12/30/19 0354  WBC 0.8* 1.0* 2.0* 2.6*  NEUTROABS 0.4*  --  1.3*  --   HGB 17.5* 14.4 12.5* 11.8*  HCT 50.9 41.2 36.9* 34.0*  MCV 90.1 90.0 91.3 89.9  PLT 130* 85* 60* 67*   Basic Metabolic Panel: Recent Labs  Lab 12/26/19 1004 12/27/19 0233 12/28/19  9233 12/29/19 0438 12/29/19 2355 12/30/19 0354  NA 136  --  140 142  --  139  K 4.2  --  2.6* 2.3* 2.6* 2.8*  CL 91*  --  104 106  --  106  CO2 28  --  25 27  --  27  GLUCOSE 215*  --  93 148*  --  131*  BUN 47*  --  37* 28*  --  17  CREATININE 1.76*  --  0.91 0.73  --  0.66  CALCIUM 10.5*  --  8.0* 8.0*  --  8.0*  MG  --  1.6* 1.6* 1.7  --  1.5*  PHOS  --  2.5 1.7* 1.1*  --  1.9*   GFR: Estimated Creatinine Clearance: 83 mL/min (by C-G formula based on SCr of 0.66 mg/dL). Liver Function Tests: Recent Labs  Lab 12/26/19 1004 12/28/19 0354 12/29/19 0438 12/30/19 0354  AST 23 17 16 20   ALT 31 20 17 18   ALKPHOS 71 40 43 44  BILITOT 1.4* 0.7 0.7 0.7  PROT 7.1 4.5*  4.5* 4.4*  ALBUMIN 4.0 2.2* 2.4* 2.3*   Recent Labs  Lab 12/26/19 1004  LIPASE 18   No results for input(s): AMMONIA in the last 168 hours. Coagulation Profile: Recent Labs  Lab 12/26/19 1004  INR 1.1   Cardiac Enzymes: No results for input(s): CKTOTAL, CKMB, CKMBINDEX, TROPONINI in the last 168 hours. BNP (last 3 results) No results for input(s): PROBNP in the last 8760 hours. HbA1C: No results for input(s): HGBA1C in the last 72 hours. CBG: Recent Labs  Lab 12/29/19 2347 12/30/19 0412 12/30/19 0719 12/30/19 1213 12/30/19 1624  GLUCAP 120* 135* 119* 166* 169*   Lipid Profile: Recent Labs    12/28/19 0354  TRIG 93   Thyroid Function Tests: No results for input(s): TSH, T4TOTAL, FREET4, T3FREE, THYROIDAB in the last 72 hours. Anemia Panel: No results for input(s): VITAMINB12, FOLATE, FERRITIN, TIBC, IRON, RETICCTPCT in the last 72 hours. Urine analysis:    Component Value Date/Time   COLORURINE AMBER (A) 12/26/2019 1131   APPEARANCEUR HAZY (A) 12/26/2019 1131   LABSPEC 1.018 12/26/2019 1131   PHURINE 5.0 12/26/2019 1131   GLUCOSEU NEGATIVE 12/26/2019 1131   HGBUR MODERATE (A) 12/26/2019 1131   BILIRUBINUR NEGATIVE 12/26/2019 1131   KETONESUR 20 (A) 12/26/2019 1131   PROTEINUR 30 (A) 12/26/2019 1131   UROBILINOGEN 1.0 01/19/2015 1312   NITRITE NEGATIVE 12/26/2019 1131   LEUKOCYTESUR SMALL (A) 12/26/2019 1131   Recent Results (from the past 240 hour(s))  Culture, blood (Routine x 2)     Status: None (Preliminary result)   Collection Time: 12/26/19 10:04 AM   Specimen: BLOOD  Result Value Ref Range Status   Specimen Description   Final    BLOOD LEFT ANTECUBITAL Performed at [facility], [place] [address], [place], [place] [address]    Special Requests   Final    BOTTLES DRAWN AEROBIC AND ANAEROBIC Blood Culture results may not be optimal due to an inadequate volume of blood received in culture bottles Performed at [facility] [address] [place], [place] [address]    Culture   Final    NO GROWTH 4 DAYS Performed at [facility], [place] [address] [place], [place] [address]    Report Status PENDING  Incomplete  Culture, blood (Routine x 2)     Status: None (Preliminary result)   Collection Time: 12/26/19 10:09 AM  Specimen: BLOOD  Result Value Ref Range Status   Specimen Description   Final    BLOOD RIGHT ANTECUBITAL Performed at [facility] [address], [place], [place] [address]    Special Requests   Final    BOTTLES DRAWN AEROBIC AND ANAEROBIC Blood Culture adequate volume Performed at [facility] [address] [place], [place] [address]    Culture   Final    NO GROWTH 4 DAYS Performed at [facility], [place] [address], [place], [place] [address]    Report Status PENDING  Incomplete  SARS Coronavirus 2 by RT PCR (hospital order, performed in [facility] hospital lab) Nasopharyngeal Nasopharyngeal Swab     Status: None   Collection Time: 12/26/19 12:34 PM   Specimen: Nasopharyngeal Swab  Result Value Ref Range Status   SARS Coronavirus 2 NEGATIVE NEGATIVE Final    Comment: (NOTE) SARS-CoV-2 target nucleic acids are NOT DETECTED.  The SARS-CoV-2 RNA is generally detectable in upper and lower respiratory specimens during the acute phase of infection. The lowest concentration of SARS-CoV-2 viral copies this assay can detect is 250 copies / mL. A negative result does not preclude SARS-CoV-2 infection and should not be used as the sole basis for treatment or other patient management decisions.  A negative result may occur with improper specimen collection / handling, submission of specimen other than nasopharyngeal swab, presence of viral mutation(s) within the areas targeted by this assay, and inadequate number of viral copies (<250 copies / mL). A negative result must be combined with clinical observations, patient history, and  epidemiological information.  Fact Sheet for Patients:   StrictlyIdeas.no  Fact Sheet for Healthcare Providers: BankingDealers.co.za  This test is not yet approved or  cleared by the [place] FDA and has been authorized for detection and/or diagnosis of SARS-CoV-2 by FDA under an Emergency Use Authorization (EUA).  This EUA will remain in effect (meaning this test can be used) for the duration of the COVID-19 declaration under Section 564(b)(1) of the Act, 21 U.S.C. section 360bbb-3(b)(1), unless the authorization is terminated or revoked sooner.  Performed at [facility], [place] [address] [place], [place] [address]       Radiology Studies: No results found.  Scheduled Meds: . arformoterol  15 mcg Nebulization BID  . Chlorhexidine Gluconate Cloth  6 each Topical Daily  . fluticasone  2 spray Each Nare Daily  . insulin aspart  0-9 Units Subcutaneous Q4H  . nicotine  21 mg Transdermal Daily  . nystatin  5 mL Oral QID  . pantoprazole (PROTONIX) IV  40 mg Intravenous Q24H  . sodium chloride flush  10-40 mL Intracatheter Q12H   Continuous Infusions: . 0.9 % NaCl with KCl 20 mEq / L 60 mL/hr at 12/30/19 1442  . ceFEPime (MAXIPIME) IV 2 g (12/30/19 1347)  . metronidazole 500 mg (12/30/19 1234)  . potassium PHOSPHATE IVPB (in mmol) 30 mmol (12/30/19 1449)  . TPN ADULT (ION) 40 mL/hr at 12/29/19 1722  . TPN ADULT (ION)    . vancomycin 750 mg (12/30/19 1122)     LOS: 4 days   Time spent: 35 minutes.  Patrecia Pour, MD [facility] [email or web address] 12/30/2019, 5:20 PM

## 2019-12-30 NOTE — Progress Notes (Signed)
Physical Therapy Treatment Patient Details Name: Chad Gomez MRN: [id number] DOB: 20-Sep-1945 Today's Date: 12/30/2019    History of Present Illness 74 y.o. male with medical history significant for stage IIIa non-small cell cancer of the lung status post robotic assisted lobectomy on 10/2019, undergoing chemotherapy first dose given last week, COPD, ongoing smoking, T2DM, HTN, HLD, PAD s/p bypass, CKD, chronic indwelling Foley.  Pt admitted for partial SBO.    PT Comments    Pt in bed with spouse at bed side.  NG Tube D/C.  Pt feeling better.  Assisted with amb in hallway.  General Gait Details: first amb with walker then without.  Balance is good.  WNL speed and stride.  Tolerated a functional distance.  Pt will not need walker but is interested in getting acne for community use.  Spose agrees and stated she would "pick one up"   Follow Up Recommendations  No PT follow up;Supervision for mobility/OOB     Equipment Recommendations  None recommended by PT    Recommendations for Other Services       Precautions / Restrictions Precautions Precautions: Fall Precaution Comments: none    Mobility  Bed Mobility Overal bed mobility: Needs Assistance Bed Mobility: Supine to Sit     Supine to sit: Supervision     General bed mobility comments: increased time  Transfers Overall transfer level: Needs assistance Equipment used: Rolling walker (2 wheeled);None Transfers: Sit to/from [facility] Sit to Stand: Supervision Stand pivot transfers: Supervision;Min guard       General transfer comment: good safety cognition and use of hands to steady self  Ambulation/Gait Ambulation/Gait assistance: Supervision;Min guard Gait Distance (Feet): 285 Feet Assistive device: Rolling walker (2 wheeled);None Gait Pattern/deviations: Step-through pattern;Decreased stride length;Trunk flexed Gait velocity: WFL   General Gait Details: first amb with walker then without.   Balance is good.  WNL speed and stride.  Tolerated a functional distance.  Pt will not need walker but is interested in getting acne for community use.  Spose agrees and stated she would "pick one up"   Marine scientist [place] (Stroke Patients Only)       Balance                                            Cognition   Behavior During Therapy: WFL for tasks assessed/performed Overall Cognitive Status: Within Functional Limits for tasks assessed                                 General Comments: AxO x 3 pleasant      Exercises      General Comments        Pertinent Vitals/Pain Pain Assessment: No/denies pain    Home Living                      Prior Function            PT Goals (current goals can now be found in the care plan section) Progress towards PT goals: Progressing toward goals    Frequency    Min 3X/week      PT Plan Current plan remains appropriate    Co-evaluation  AM-PAC PT "6 Clicks" Mobility   Outcome Measure  Help needed turning from your back to your side while in a flat bed without using bedrails?: None Help needed moving from lying on your back to sitting on the side of a flat bed without using bedrails?: None Help needed moving to and from a bed to a chair (including a wheelchair)?: None Help needed standing up from a chair using your arms (e.g., wheelchair or bedside chair)?: None Help needed to walk in hospital room?: None Help needed climbing 3-5 steps with a railing? : A Little 6 Click Score: 23    End of Session Equipment Utilized During Treatment: Gait belt Activity Tolerance: Patient tolerated treatment well Patient left: in bed;with call bell/phone within reach;with family/visitor present Nurse Communication: Mobility status PT Visit Diagnosis: Difficulty in walking, not elsewhere classified (R26.2)     Time:  1117-1130 PT Time Calculation (min) (ACUTE ONLY): 13 min  Charges:  $Gait Training: 8-22 mins                     Rica Koyanagi  PTA Acute  Rehabilitation Services Pager      [phone] Office      [phone]

## 2019-12-31 LAB — BASIC METABOLIC PANEL
Anion gap: 6 (ref 5–15)
BUN: 14 mg/dL (ref 8–23)
CO2: 26 mmol/L (ref 22–32)
Calcium: 8 mg/dL — ABNORMAL LOW (ref 8.9–10.3)
Chloride: 103 mmol/L (ref 98–111)
Creatinine, Ser: 0.61 mg/dL (ref 0.61–1.24)
GFR calc Af Amer: >60 mL/min (ref >60)
GFR calc non Af Amer: >60 mL/min (ref >60)
Glucose, Bld: 119 mg/dL — ABNORMAL HIGH (ref 70–99)
Potassium: 2.9 mmol/L — ABNORMAL LOW (ref 3.5–5.1)
Sodium: 135 mmol/L (ref 135–145)

## 2019-12-31 LAB — CBC
HCT: 35.1 % — ABNORMAL LOW (ref 39.0–52.0)
Hemoglobin: 12.2 g/dL — ABNORMAL LOW (ref 13.0–17.0)
MCH: 31.1 pg (ref 26.0–34.0)
MCHC: 34.8 g/dL (ref 30.0–36.0)
MCV: 89.5 fL (ref 80.0–100.0)
Platelets: 76 10*3/uL — ABNORMAL LOW (ref 150–400)
RBC: 3.92 MIL/uL — ABNORMAL LOW (ref 4.22–5.81)
RDW: 12.7 % (ref 11.5–15.5)
WBC: 2.9 10*3/uL — ABNORMAL LOW (ref 4.0–10.5)
nRBC: 0 % (ref 0.0–0.2)

## 2019-12-31 LAB — CULTURE, BLOOD (ROUTINE X 2)
Culture: NO GROWTH
Culture: NO GROWTH
Special Requests: ADEQUATE

## 2019-12-31 LAB — GLUCOSE, CAPILLARY
Glucose-Capillary: 119 mg/dL — ABNORMAL HIGH (ref 70–99)
Glucose-Capillary: 124 mg/dL — ABNORMAL HIGH (ref 70–99)
Glucose-Capillary: 126 mg/dL — ABNORMAL HIGH (ref 70–99)
Glucose-Capillary: 132 mg/dL — ABNORMAL HIGH (ref 70–99)
Glucose-Capillary: 137 mg/dL — ABNORMAL HIGH (ref 70–99)
Glucose-Capillary: 155 mg/dL — ABNORMAL HIGH (ref 70–99)
Glucose-Capillary: 166 mg/dL — ABNORMAL HIGH (ref 70–99)

## 2019-12-31 LAB — PHOSPHORUS: Phosphorus: 1.9 mg/dL — ABNORMAL LOW (ref 2.5–4.6)

## 2019-12-31 LAB — MAGNESIUM: Magnesium: 1.5 mg/dL — ABNORMAL LOW (ref 1.7–2.4)

## 2019-12-31 MED ORDER — TRAVASOL 10 % IV SOLN
INTRAVENOUS | Status: AC
  Filled 2019-12-31: qty 547.2

## 2019-12-31 MED ORDER — POTASSIUM CHLORIDE CRYS ER 20 MEQ PO TBCR
40.0000 meq | EXTENDED_RELEASE_TABLET | Freq: Once | ORAL | Status: AC
  Administered 2019-12-31: 40 meq via ORAL
  Filled 2019-12-31: qty 2

## 2019-12-31 MED ORDER — MAGNESIUM OXIDE 400 (241.3 MG) MG PO TABS
400.0000 mg | ORAL_TABLET | Freq: Every day | ORAL | Status: DC
  Administered 2019-12-31 – 2020-01-02 (×3): 400 mg via ORAL
  Filled 2019-12-31 (×3): qty 1

## 2019-12-31 MED ORDER — TRAVASOL 10 % IV SOLN
INTRAVENOUS | Status: DC
  Filled 2019-12-31: qty 547.2

## 2019-12-31 MED ORDER — POTASSIUM CHLORIDE 10 MEQ/100ML IV SOLN
10.0000 meq | INTRAVENOUS | Status: AC
  Administered 2019-12-31 (×4): 10 meq via INTRAVENOUS
  Filled 2019-12-31 (×4): qty 100

## 2019-12-31 MED ORDER — BOOST / RESOURCE BREEZE PO LIQD CUSTOM
1.0000 | Freq: Three times a day (TID) | ORAL | Status: DC
  Administered 2019-12-31 – 2020-01-02 (×6): 1 via ORAL

## 2019-12-31 MED ORDER — MAGNESIUM SULFATE 50 % IJ SOLN
6.0000 g | INTRAVENOUS | Status: AC
  Administered 2019-12-31: 6 g via INTRAVENOUS
  Filled 2019-12-31: qty 10

## 2019-12-31 MED ORDER — POTASSIUM PHOSPHATES 15 MMOLE/5ML IV SOLN
40.0000 mmol | Freq: Once | INTRAVENOUS | Status: AC
  Administered 2019-12-31: 40 mmol via INTRAVENOUS
  Filled 2019-12-31: qty 13.33

## 2019-12-31 NOTE — Progress Notes (Addendum)
PHARMACY - TOTAL PARENTERAL NUTRITION CONSULT NOTE   Indication: partial small bowel obstruction vs. Ileus   Patient Measurements: Height: 5\' 10"  (177.8 cm) Weight: 70.3 kg (155 lb) IBW/kg (Calculated) : 73 TPN AdjBW (KG): 70.3 Body mass index is 22.24 kg/m.  Assessment: 36 y/oM with pSBO with stage 3a NSLC and progressively worsening appetite, abdominal pain and weakness since chemo last week. Pharmacy consulted to start TPN. NGT removed 7/9 and diet advanced from clear liquids to full liquids. Patient tolerating full liquids per RN.    Glucose / Insulin: hx DM (on metformin PTA), CBGs 119-169 (goal < 150). Received 6 units SSI in past 24 hours.  Electrolytes: K+ remains low but trending up after aggressive repletion. Mag and Phos remain low/unchanged despite aggressive repletion yesterday. Goal K+ =/> 4 and Mag =/> 2. Others, including corrected calcium, WNL.  Renal: SCr 0.61, WNL LFTs / TGs: LFTs WNL (7/9); TG 93 (7/7) Prealbumin / albumin: Prealbumin 9.8 (7/7); Albumin 2.3 (7/9) Intake / Output; MIVF: I/O 1844/3800; NS with 69mEq KCl/L at 4mL/hr  GI Imaging: pSBO per CT Surgeries / Procedures: none  Central access: 7/6 PICC Line TPN start date: 7/7  Nutritional Goals (per RD recommendation on 7/6): kCal: 2125-2320, Protein: 110-125 grams, Fluid: >= 2.1 L/day  Goal TPN rate is 90 mL/hr (provides 123g of protein and 2169 kcals per day)  Current Nutrition:  Clear liquid diet --> advanced to full liquids, TPN  Plan:  Now: KCl 65mEq IV x 4 runs + KCl 22mEq PO x 1 Mag Sulfate 6g IV x 1 Resume home Magnesium Oxide 400mg  PO daily  KPhos 19mmol IV x 1   At 1800 today: Continue TPN at 63mL/hr (will not advance yet due to significant electrolyte abnormalities)  Electrolytes in TPN: 44mEq/L of Na, 25mEq/L of K (increased), 68mEq/L of Ca, 43mEq/L of Mag, and 59mmol/L of Phos (increased). Cl:Ac ratio 1:1 Add standard MVI and trace elements to TPN Continue Sensitive SSI q4h and  adjust as needed  Continue MIVF at 60 mL/hr  Monitor TPN labs on Mon/Thurs BMET, Mag, Phos in AM F/u ability to tolerate diet    Lindell Spar, PharmD, BCPS Clinical Pharmacist  12/31/2019,10:25 AM

## 2019-12-31 NOTE — Progress Notes (Signed)
PROGRESS NOTE  AKSEL BENCOMO  WJX:[id number] DOB: 09-05-1945 DOA: 12/26/2019 PCP: [name], MD   Brief Narrative: NIMAI BURBACH is a 74 y.o. male with a history of stage IIIa NSCLC s/p lobectomy May 2021 having initiated chemotherapy June 2021, COPD, ongoing tobacco use, PAD s/p aorto-bifemoral bypass 2016, chronic foley catheterization, ventral hernia, T2DM, HTN, and HLD who presented to the ED 7/5 with abdominal pain, weakness, and limited per oral intake. In the ED temperature 99.66F, HR 100's, RR 20's/min. SCr 1.76 from normal baseline, lactic acid 3.4, pancytopenic with WBC 0.8, platelets 130. CXR normal. CT abdomen/pelvis showed multiple thick-walled mild dilated small bowel loops in the mid and lower abdomen probably as a result of partial bowel obstruction due to herniated small bowel loops in the midline anterior abdominal hernia. NG tube inserted, placed to suction, surgery consulted, and the patient was admitted under hospitalist service. Failed clamping trials. TPN was recommended and was started 7/7 through PICC placed 7/6. Severe electrolyte derangements are being monitored and managed.  Assessment & Plan: Principal Problem:   SBO (small bowel obstruction) (HCC) Active Problems:   Atherosclerosis of native artery of both lower extremities with intermittent claudication (HCC)   S/P aortobifemoral bypass surgery   Centrilobular emphysema (HCC)   Stage 4 very severe COPD by GOLD classification ([place])   Tobacco abuse   Hypertension   Dyslipidemia   Type 2 diabetes mellitus with complication, without long-term current use of insulin (HCC)   Lung cancer, lingula (HCC)   Leukopenia due to antineoplastic chemotherapy (HCC)   Sepsis ([place])   Ventral hernia   Dehydration   Lactic acidosis   Chronic indwelling Foley catheter   Acute lower UTI   Protein-calorie malnutrition, severe  Ileus vs. partial SBO: Having bowel function.   - Appreciate general surgery recommendations,  suspecting chemotherapy-induced enteritis. - Advance diet to soft. - Continue TPN until tolerating an adequate diet.    Neutropenic fever with lactic acidosis, chemotherapy-induced pancytopenia, and sepsis due to enteritis: Lactate has cleared.  - Continue broad IV antibiotics w/vancomycin, cefepime, flagyl pending further culture data. Since blood cultures remain NGTD, so vancomycin stopped after 5 days with no pulmonary or skin source. With bacteriuria and pyuria, urinary source is considered, though could also be incidental colonization.   - Granix given 7/5  - Monitor CBC. Holding pharmacologic DVT ppx with thrombocytopenia and frequent ambulation.  Ventral hernia: Reducible. No current plans for operative management.  Severe protein-calorie malnutrition: Prealbumin baseline 9.8. Albumin 2.2.  - With need for ongoing chemotherapy and pSBO, we have inserted PICC (double lumen right brachial 7/6) and started TPN (7/7). Still at 1ml/hr due to electrolyte deficiencies. If po intake improves, we can DC TPN but would have low threshold to use this if he has a recurrent episode. - Supplement protein with boost, pt has tolerated this well in the past.  AKI: On what is presumably normal baseline CrCl. Improving w/IVF, does not appear to have CKD.  - Continuing low rate IVF until TPN at goal, potentially 7/10.  - Avoid nephrotoxins as able  Hypomagnesemia, hypophosphatemia, hypokalemia: Remains at very high risk of refeeding syndrome. Overall improving trends with supplementation. - Monitor daily, continuing aggressive supplementation and intensive monitoring. See orders, though we are supplementing all again today. Able to supplement by mouth as well currently. Telemetry personally reviewed today showing NSR with mild intermittent bradycardia.   NSCLC of lingula stage IIIa s/p LUL resection 10/28/2019 and first Tx 12/19/2019 with pembrolizumab, pemetrexid, cisplatin:  -  Not due for another treatment  at this time.  Stage IV COPD, ongoing tobacco use: No exacerbation currently. - Nicotine patch, smoking cessation counseling provided.  - Continue home medications and prn BDs.   BPH, chronic urinary retention with indwelling foley catheterization:  - Restarted home finasteride, flomax  - With history of urolithiasis, will restart K-citrate once no longer requiring intensive electrolyte supplementation. - Exchanged foley catheter 12/30/2019  PAD s/p bypass:  - Continue statin, will hold aspirin while platelets low.  T2DM: Well-controlled with HbA1c 6.1%.  - SSI, holding metformin   HTN:  - Can restart BP medications, will start with norvasc and hold indapamide in setting of metabolic derangements as above.    Thrush: Improving - Continue nystatin  DVT prophylaxis: SCDs Code Status: Full Family Communication: Wife at bedside Disposition Plan:  Status is: Inpatient  Remains inpatient appropriate because:IV treatments appropriate due to intensity of illness or inability to take PO, advancing diet to fulls. Has not proven ability to take sufficient nutrition po. If able to take adequate po, can DC in next 24-48hrs.   Dispo: The patient is from: Home              Anticipated d/c is to: Home. No PT follow up recommended. Has DME.              Anticipated d/c date is: 24-48hrs, if electrolytes show improvement and he is sustaining adequate nutrition independent of IV fluids and TPN.              Patient currently is not medically stable to d/c.  Consultants:   General surgery  Pharmacy  Procedures:   None  Antimicrobials:  Vancomycin 7/5 - 7/9  Cefepime, flagyl 7/5 >>    Subjective: Abdominal pain is stable, waxing/waning, severe at times, located in the left hemiabdomen, somewhat overlying hernia, but not entirely, but overall improved from prior, not requiring IV dilaudid x24 hours. Ne fever or chills.   Objective: Vitals:   12/30/19 2133 12/31/19 0528 12/31/19 0820  12/31/19 1519  BP:  128/77  124/73  Pulse:  69  79  Resp:    14  Temp:  98.5 F (36.9 C)  99 F (37.2 C)  TempSrc:  Oral  Oral  SpO2: 94% 97% 95% 96%  Weight:      Height:        Intake/Output Summary (Last 24 hours) at 12/31/2019 1646 Last data filed at 12/31/2019 1537 Gross per 24 hour  Intake 1843.95 ml  Output 3700 ml  Net -1856.05 ml   Filed Weights   12/26/19 1003 12/26/19 1756  Weight: 76.2 kg 70.3 kg   Gen: 74 y.o. male in no distress Pulm: Nonlabored breathing room air. Clear. CV: Regular rate and rhythm. No murmur, rub, or gallop. No JVD, no dependent edema. GI: Abdomen soft, mildly tender left of umbilicus. Hernia mildly tender without erythema, completely reducible. Normal bowel sounds. Non-distended  Ext: Warm, no deformities Skin: No new rashes, lesions or ulcers on visualized skin. PICC site c/d/i. Neuro: Alert and oriented. No focal neurological deficits. Psych: Judgement and insight appear fair. Mood euthymic & affect congruent. Behavior is appropriate.    Data Reviewed: I have personally reviewed following labs and imaging studies  CBC: Recent Labs  Lab 12/26/19 1004 12/27/19 0233 12/28/19 0354 12/30/19 0354 12/31/19 0512  WBC 0.8* 1.0* 2.0* 2.6* 2.9*  NEUTROABS 0.4*  --  1.3*  --   --   HGB 17.5* 14.4 12.5* 11.8*  12.2*  HCT 50.9 41.2 36.9* 34.0* 35.1*  MCV 90.1 90.0 91.3 89.9 89.5  PLT 130* 85* 60* 67* 76*   Basic Metabolic Panel: Recent Labs  Lab 12/26/19 1004 12/26/19 1004 12/27/19 0233 12/28/19 0354 12/29/19 0438 12/29/19 2355 12/30/19 0354 12/31/19 0512  NA 136  --   --  140 142  --  139 135  K 4.2   < >  --  2.6* 2.3* 2.6* 2.8* 2.9*  CL 91*  --   --  104 106  --  106 103  CO2 28  --   --  25 27  --  27 26  GLUCOSE 215*  --   --  93 148*  --  131* 119*  BUN 47*  --   --  37* 28*  --  17 14  CREATININE 1.76*  --   --  0.91 0.73  --  0.66 0.61  CALCIUM 10.5*  --   --  8.0* 8.0*  --  8.0* 8.0*  MG  --   --  1.6* 1.6* 1.7  --  1.5*  1.5*  PHOS  --   --  2.5 1.7* 1.1*  --  1.9* 1.9*   < > = values in this interval not displayed.   GFR: Estimated Creatinine Clearance: 83 mL/min (by C-G formula based on SCr of 0.61 mg/dL). Liver Function Tests: Recent Labs  Lab 12/26/19 1004 12/28/19 0354 12/29/19 0438 12/30/19 0354  AST 23 17 16 20   ALT 31 20 17 18   ALKPHOS 71 40 43 44  BILITOT 1.4* 0.7 0.7 0.7  PROT 7.1 4.5* 4.5* 4.4*  ALBUMIN 4.0 2.2* 2.4* 2.3*   Recent Labs  Lab 12/26/19 1004  LIPASE 18   No results for input(s): AMMONIA in the last 168 hours. Coagulation Profile: Recent Labs  Lab 12/26/19 1004  INR 1.1   Cardiac Enzymes: No results for input(s): CKTOTAL, CKMB, CKMBINDEX, TROPONINI in the last 168 hours. BNP (last 3 results) No results for input(s): PROBNP in the last 8760 hours. HbA1C: No results for input(s): HGBA1C in the last 72 hours. CBG: Recent Labs  Lab 12/30/19 2005 12/31/19 0018 12/31/19 0424 12/31/19 0743 12/31/19 1149  GLUCAP 120* 137* 124* 126* 166*   Lipid Profile: No results for input(s): CHOL, HDL, LDLCALC, TRIG, CHOLHDL, LDLDIRECT in the last 72 hours. Thyroid Function Tests: No results for input(s): TSH, T4TOTAL, FREET4, T3FREE, THYROIDAB in the last 72 hours. Anemia Panel: No results for input(s): VITAMINB12, FOLATE, FERRITIN, TIBC, IRON, RETICCTPCT in the last 72 hours. Urine analysis:    Component Value Date/Time   COLORURINE AMBER (A) 12/26/2019 1131   APPEARANCEUR HAZY (A) 12/26/2019 1131   LABSPEC 1.018 12/26/2019 1131   PHURINE 5.0 12/26/2019 1131   GLUCOSEU NEGATIVE 12/26/2019 1131   HGBUR MODERATE (A) 12/26/2019 1131   BILIRUBINUR NEGATIVE 12/26/2019 1131   KETONESUR 20 (A) 12/26/2019 1131   PROTEINUR 30 (A) 12/26/2019 1131   UROBILINOGEN 1.0 01/19/2015 1312   NITRITE NEGATIVE 12/26/2019 1131   LEUKOCYTESUR SMALL (A) 12/26/2019 1131   Recent Results (from the past 240 hour(s))  Culture, blood (Routine x 2)     Status: None   Collection Time:  12/26/19 10:04 AM   Specimen: BLOOD  Result Value Ref Range Status   Specimen Description   Final    BLOOD LEFT ANTECUBITAL Performed at [facility] [address] [place], [place] [address]    Special Requests   Final    BOTTLES  DRAWN AEROBIC AND ANAEROBIC Blood Culture results may not be optimal due to an inadequate volume of blood received in culture bottles Performed at [facility], [place] [address], [place], [place] [address]    Culture   Final    NO GROWTH 5 DAYS Performed at [facility], [place] [address], [place], [place] [address]    Report Status 12/31/2019 FINAL  Final  Culture, blood (Routine x 2)     Status: None   Collection Time: 12/26/19 10:09 AM   Specimen: BLOOD  Result Value Ref Range Status   Specimen Description   Final    BLOOD RIGHT ANTECUBITAL Performed at [facility] [address] [place], [place] [address]    Special Requests   Final    BOTTLES DRAWN AEROBIC AND ANAEROBIC Blood Culture adequate volume Performed at [facility] [address], [place], [place] [address]    Culture   Final    NO GROWTH 5 DAYS Performed at [facility], [place] [address] [place], [place] [address]    Report Status 12/31/2019 FINAL  Final  SARS Coronavirus 2 by RT PCR (hospital order, performed in [facility] hospital lab) Nasopharyngeal Nasopharyngeal Swab     Status: None   Collection Time: 12/26/19 12:34 PM   Specimen: Nasopharyngeal Swab  Result Value Ref Range Status   SARS Coronavirus 2 NEGATIVE NEGATIVE Final    Comment: (NOTE) SARS-CoV-2 target nucleic acids are NOT DETECTED.  The SARS-CoV-2 RNA is generally detectable in upper and lower respiratory specimens during the acute phase of infection. The lowest concentration of SARS-CoV-2 viral copies this assay can detect is 250 copies / mL. A negative result does not preclude SARS-CoV-2 infection and should not be used as  the sole basis for treatment or other patient management decisions.  A negative result may occur with improper specimen collection / handling, submission of specimen other than nasopharyngeal swab, presence of viral mutation(s) within the areas targeted by this assay, and inadequate number of viral copies (<250 copies / mL). A negative result must be combined with clinical observations, patient history, and epidemiological information.  Fact Sheet for Patients:   StrictlyIdeas.no  Fact Sheet for Healthcare Providers: BankingDealers.co.za  This test is not yet approved or  cleared by the [place] FDA and has been authorized for detection and/or diagnosis of SARS-CoV-2 by FDA under an Emergency Use Authorization (EUA).  This EUA will remain in effect (meaning this test can be used) for the duration of the COVID-19 declaration under Section 564(b)(1) of the Act, 21 U.S.C. section 360bbb-3(b)(1), unless the authorization is terminated or revoked sooner.  Performed at [name], [place] [address], [place],  [address]       Radiology Studies: No results found.  Scheduled Meds: . amLODipine  10 mg Oral Daily  . arformoterol  15 mcg Nebulization BID  . atorvastatin  40 mg Oral Daily  . Chlorhexidine Gluconate Cloth  6 each Topical Daily  . ezetimibe  5 mg Oral Daily  . feeding supplement  1 Container Oral TID BM  . finasteride  5 mg Oral Daily  . fluticasone  2 spray Each Nare Daily  . insulin aspart  0-9 Units Subcutaneous Q4H  . magnesium oxide  400 mg Oral Daily  . nicotine  21 mg Transdermal Daily  . nystatin  5 mL Oral QID  . pantoprazole (PROTONIX) IV  40 mg Intravenous Q24H  . sodium  chloride flush  10-40 mL Intracatheter Q12H  . tamsulosin  0.4 mg Oral QPC supper   Continuous Infusions: . 0.9 % NaCl with KCl 20 mEq / L 60 mL/hr at 12/30/19 1442  . ceFEPime (MAXIPIME) IV 2 g (12/31/19 1417)    . metronidazole 500 mg (12/31/19 1258)  . potassium PHOSPHATE IVPB (in mmol) 40 mmol (12/31/19 1523)  . TPN ADULT (ION) 40 mL/hr at 12/30/19 1746  . TPN ADULT (ION)       LOS: 5 days   Time spent: 35 minutes.  Patrecia Pour, MD [facility] [email or web address] 12/31/2019, 4:46 PM

## 2020-01-01 ENCOUNTER — Inpatient Hospital Stay (HOSPITAL_COMMUNITY): Payer: Medicare Other

## 2020-01-01 LAB — GLUCOSE, CAPILLARY
Glucose-Capillary: 106 mg/dL — ABNORMAL HIGH (ref 70–99)
Glucose-Capillary: 112 mg/dL — ABNORMAL HIGH (ref 70–99)
Glucose-Capillary: 125 mg/dL — ABNORMAL HIGH (ref 70–99)
Glucose-Capillary: 127 mg/dL — ABNORMAL HIGH (ref 70–99)
Glucose-Capillary: 159 mg/dL — ABNORMAL HIGH (ref 70–99)
Glucose-Capillary: 164 mg/dL — ABNORMAL HIGH (ref 70–99)

## 2020-01-01 LAB — BASIC METABOLIC PANEL
Anion gap: 7 (ref 5–15)
BUN: 13 mg/dL (ref 8–23)
CO2: 26 mmol/L (ref 22–32)
Calcium: 8.1 mg/dL — ABNORMAL LOW (ref 8.9–10.3)
Chloride: 104 mmol/L (ref 98–111)
Creatinine, Ser: 0.64 mg/dL (ref 0.61–1.24)
GFR calc Af Amer: >60 mL/min (ref >60)
GFR calc non Af Amer: >60 mL/min (ref >60)
Glucose, Bld: 139 mg/dL — ABNORMAL HIGH (ref 70–99)
Potassium: 3.6 mmol/L (ref 3.5–5.1)
Sodium: 137 mmol/L (ref 135–145)

## 2020-01-01 LAB — MAGNESIUM: Magnesium: 1.7 mg/dL (ref 1.7–2.4)

## 2020-01-01 LAB — CBC
HCT: 33.7 % — ABNORMAL LOW (ref 39.0–52.0)
Hemoglobin: 11.3 g/dL — ABNORMAL LOW (ref 13.0–17.0)
MCH: 30.2 pg (ref 26.0–34.0)
MCHC: 33.5 g/dL (ref 30.0–36.0)
MCV: 90.1 fL (ref 80.0–100.0)
Platelets: 102 10*3/uL — ABNORMAL LOW (ref 150–400)
RBC: 3.74 MIL/uL — ABNORMAL LOW (ref 4.22–5.81)
RDW: 12.6 % (ref 11.5–15.5)
WBC: 3.4 10*3/uL — ABNORMAL LOW (ref 4.0–10.5)
nRBC: 0 % (ref 0.0–0.2)

## 2020-01-01 LAB — PHOSPHORUS: Phosphorus: 2.3 mg/dL — ABNORMAL LOW (ref 2.5–4.6)

## 2020-01-01 MED ORDER — POTASSIUM PHOSPHATES 15 MMOLE/5ML IV SOLN
40.0000 mmol | Freq: Once | INTRAVENOUS | Status: AC
  Administered 2020-01-01: 40 mmol via INTRAVENOUS
  Filled 2020-01-01: qty 13.33

## 2020-01-01 MED ORDER — ALTEPLASE 2 MG IJ SOLR
2.0000 mg | Freq: Once | INTRAMUSCULAR | Status: AC
  Administered 2020-01-01: 2 mg
  Filled 2020-01-01: qty 2

## 2020-01-01 MED ORDER — MAGNESIUM SULFATE 4 GM/100ML IV SOLN
4.0000 g | Freq: Once | INTRAVENOUS | Status: AC
  Administered 2020-01-01: 4 g via INTRAVENOUS
  Filled 2020-01-01: qty 100

## 2020-01-01 MED ORDER — ASPIRIN 81 MG PO CHEW
81.0000 mg | CHEWABLE_TABLET | Freq: Every day | ORAL | Status: DC
  Administered 2020-01-01 – 2020-01-02 (×2): 81 mg via ORAL
  Filled 2020-01-01 (×2): qty 1

## 2020-01-01 MED ORDER — TRAVASOL 10 % IV SOLN
INTRAVENOUS | Status: DC
  Filled 2020-01-01: qty 547.2

## 2020-01-01 MED ORDER — POTASSIUM CHLORIDE IN NACL 20-0.9 MEQ/L-% IV SOLN
INTRAVENOUS | Status: DC
  Filled 2020-01-01: qty 1000

## 2020-01-01 MED ORDER — POTASSIUM CHLORIDE CRYS ER 20 MEQ PO TBCR
40.0000 meq | EXTENDED_RELEASE_TABLET | Freq: Once | ORAL | Status: AC
  Administered 2020-01-01: 40 meq via ORAL
  Filled 2020-01-01: qty 2

## 2020-01-01 MED ORDER — MAGNESIUM SULFATE 4 GM/100ML IV SOLN: 4.0000 g | INTRAVENOUS | Status: DC

## 2020-01-01 MED ORDER — PANTOPRAZOLE SODIUM 40 MG PO TBEC
40.0000 mg | DELAYED_RELEASE_TABLET | Freq: Every day | ORAL | Status: DC
  Administered 2020-01-01: 40 mg via ORAL
  Filled 2020-01-01: qty 1

## 2020-01-01 MED ORDER — KCL IN DEXTROSE-NACL 20-5-0.9 MEQ/L-%-% IV SOLN: INTRAVENOUS | Status: DC

## 2020-01-01 NOTE — Progress Notes (Addendum)
PHARMACY - TOTAL PARENTERAL NUTRITION CONSULT NOTE   Indication: partial small bowel obstruction vs. Ileus   Patient Measurements: Height: 5\' 10"  (177.8 cm) Weight: 70.3 kg (155 lb) IBW/kg (Calculated) : 73 TPN AdjBW (KG): 70.3 Body mass index is 22.24 kg/m.  Assessment: 24 y/oM with pSBO with stage 3a NSLC and progressively worsening appetite, abdominal pain and weakness since chemo last week. Pharmacy consulted to start TPN. NGT removed 7/9 and diet advanced from clear liquids to full liquids to soft diet.     Glucose / Insulin: hx DM (on metformin PTA), CBGs 119-166 (goal < 150). Received 7 units SSI in past 24 hours.  Electrolytes: K+ 3.6, Phos 2.3, Mag 1.7-low but improved/trending up after aggressive repletion for several days. Goal K+ =/> 4 and Mag =/> 2. Others, including corrected calcium, WNL.  Renal: SCr 0.64, WNL LFTs / TGs: LFTs WNL (7/9); TG 93 (7/7) Prealbumin / albumin: Prealbumin 9.8 (7/7); Albumin 2.3 (7/9) Intake / Output; MIVF: I/O 1578/2500; NS with 64mEq KCl/L at 24mL/hr  GI Imaging: pSBO per CT Surgeries / Procedures: none  Central access: 7/6 PICC Line TPN start date: 7/7  Nutritional Goals (per RD recommendation on 7/6): kCal: 2125-2320, Protein: 110-125 grams, Fluid: >= 2.1 L/day  Goal TPN rate is 90 mL/hr (provides 123g of protein and 2169 kcals per day)  Current Nutrition:  Soft diet, Boost TID, TPN  Plan:  Now: Mag Sulfate 4g IV x 1 already ordered per MD Continue home Magnesium Oxide 400mg  PO daily  KPhos 41mmol IV x 1 already ordered per MD KCl 56mEq PO x 1  At 1800 today: Continue TPN at 74mL/hr (will not advance further given electrolyte abnormalities, PO intake improving with diet advancement, consuming ~ 75% breakfast this AM per RN report, plans to stop TPN tomorrow).   Electrolytes in TPN: 64mEq/L of Na, 47mEq/L of K, 20mEq/L of Ca, 75mEq/L of Mag, and 90mmol/L of Phos. Cl:Ac ratio 1:1 Add standard MVI and trace elements to TPN Continue  Sensitive SSI q4h and adjust as needed  Continue MIVF at 60 mL/hr  Monitor TPN labs on Mon/Thurs F/u ability to tolerate diet    Lindell Spar, PharmD, BCPS Clinical Pharmacist  01/01/2020,12:17 PM

## 2020-01-01 NOTE — Progress Notes (Signed)
Pt has right dual lumen PICC that is clotted. Site wnl - no swelling, warmth, redness, or drainage noted. Alteplase administered to both ports per protocol without success. Suggest chest xray and repeat declotting if chest xray wnl. Peripheral IV started for antibiotics at this time. Bedside RN to notify MD.  Randol Kern, RN VAST

## 2020-01-01 NOTE — Progress Notes (Signed)
PROGRESS NOTE  Chad Gomez  DXI:[id number] DOB: 05-28-1946 DOA: 12/26/2019 PCP: [name], MD   Brief Narrative: Chad Gomez is a 74 y.o. male with a history of stage IIIa NSCLC s/p lobectomy May 2021 having initiated chemotherapy June 2021, COPD, ongoing tobacco use, PAD s/p aorto-bifemoral bypass 2016, chronic foley catheterization, ventral hernia, T2DM, HTN, and HLD who presented to the ED 7/5 with abdominal pain, weakness, and limited per oral intake. In the ED temperature 99.69F, HR 100's, RR 20's/min. SCr 1.76 from normal baseline, lactic acid 3.4, pancytopenic with WBC 0.8, platelets 130. CXR normal. CT abdomen/pelvis showed multiple thick-walled mild dilated small bowel loops in the mid and lower abdomen probably as a result of partial bowel obstruction due to herniated small bowel loops in the midline anterior abdominal hernia. NG tube inserted, placed to suction, surgery consulted, and the patient was admitted under hospitalist service. Failed clamping trials. TPN was recommended and was started 7/7 through PICC placed 7/6. Severe electrolyte derangements are being monitored and managed.  Assessment & Plan: Principal Problem:   SBO (small bowel obstruction) (HCC) Active Problems:   Atherosclerosis of native artery of both lower extremities with intermittent claudication (HCC)   S/P aortobifemoral bypass surgery   Centrilobular emphysema (HCC)   Stage 4 very severe COPD by GOLD classification ([place])   Tobacco abuse   Hypertension   Dyslipidemia   Type 2 diabetes mellitus with complication, without long-term current use of insulin (HCC)   Lung cancer, lingula (HCC)   Leukopenia due to antineoplastic chemotherapy (HCC)   Sepsis ([place])   Ventral hernia   Dehydration   Lactic acidosis   Chronic indwelling Foley catheter   Acute lower UTI   Protein-calorie malnutrition, severe  Ileus vs. partial SBO: Having bowel function.   - Appreciate general surgery recommendations,  suspecting chemotherapy-induced enteritis. - Advance diet to soft. - Continue TPN until tolerating an adequate diet.    Neutropenic fever with lactic acidosis, chemotherapy-induced pancytopenia, and sepsis due to enteritis: Lactate has cleared.  - Continued broad IV antibiotics though cultures now negative x5 days, remains afebrile. Will check CBC w/diff in AM. Vancomycin stopped after 5 days with no pulmonary or skin source. With bacteriuria and pyuria, urinary source is considered, though could also be incidental colonization.   - Granix given 7/5, counts improving. - Monitor CBC. Holding pharmacologic DVT ppx with thrombocytopenia and frequent ambulation. Ok to restart aspirin.  Ventral hernia: Reducible. No current plans for operative management.  Severe protein-calorie malnutrition: Prealbumin baseline 9.8. Albumin 2.2.  - With need for ongoing chemotherapy and pSBO, we have inserted PICC (double lumen right brachial 7/6) and started TPN (7/7). Still at 39ml/hr due to electrolyte deficiencies. If po intake improves, we can DC TPN but would have low threshold to use this if he has a recurrent episode. - Supplement protein with boost, pt has tolerated this well in the past.  AKI: Improved, does not appear to have CKD.  - Avoid nephrotoxins as able  Hypomagnesemia, hypophosphatemia, hypokalemia: Remains at very high risk of refeeding syndrome. Overall improving trends with supplementation. - Monitor daily, continuing aggressive supplementation and intensive monitoring. See orders, though we are supplementing all again today. Able to supplement by mouth as well currently.   NSCLC of lingula stage IIIa s/p LUL resection 10/28/2019 and first Tx 12/19/2019 with pembrolizumab, pemetrexid, cisplatin:  - Not due for another treatment at this time.  Stage IV COPD, ongoing tobacco use: No exacerbation currently. - Nicotine patch, smoking  cessation counseling provided.  - Continue home medications and  prn BDs.   BPH, chronic urinary retention with indwelling foley catheterization:  - Restarted home finasteride, flomax  - With history of urolithiasis, will restart K-citrate once no longer requiring intensive electrolyte supplementation. - Exchanged foley catheter 12/30/2019  PAD s/p bypass:  - Continue statin, will restart aspirin as above  T2DM: Well-controlled with HbA1c 6.1%.  - SSI, holding metformin   HTN:  - Restart norvasc and hold indapamide in setting of metabolic derangements as above.    Thrush: Improving - Continue nystatin  DVT prophylaxis: SCDs Code Status: Full Family Communication: Wife at bedside Disposition Plan:  Status is: Inpatient  Remains inpatient appropriate because:IV treatments appropriate due to intensity of illness or inability to take PO. Has not proven ability to take sufficient nutrition po. If able to take adequate po, can DC in next 24-48hrs.   Dispo: The patient is from: Home              Anticipated d/c is to: Home. No PT follow up recommended. Has DME.              Anticipated d/c date is: 24 hrs if electrolyte derangements correct and pt's po intake increases.               Patient currently is not medically stable to d/c.  Consultants:   General surgery  Pharmacy  Procedures:   None  Antimicrobials:  Vancomycin 7/5 - 7/9  Cefepime, flagyl 7/5 >>    Subjective: Ate a little less than half of breakfast from his report. No appetite. No nausea or vomiting. Abdominal pain is 3/10 located on the left hemiabdomen mostly, intermittent, without radiation.  Objective: Vitals:   12/31/19 2133 01/01/20 0610 01/01/20 0930 01/01/20 1426  BP: 140/79 135/84  128/82  Pulse: 73 77  82  Resp: 17 18  16   Temp: 98.3 F (36.8 C) 98.1 F (36.7 C)  98.4 F (36.9 C)  TempSrc: Oral Oral  Oral  SpO2: 96% 93% 95% 96%  Weight:      Height:        Intake/Output Summary (Last 24 hours) at 01/01/2020 1734 Last data filed at 01/01/2020  1451 Gross per 24 hour  Intake 1818.39 ml  Output 2100 ml  Net -281.61 ml   Filed Weights   12/26/19 1003 12/26/19 1756  Weight: 76.2 kg 70.3 kg   Gen: 74 y.o. male in no distress Pulm: Nonlabored breathing room air. Clear. CV: Regular rate and rhythm. No murmur, rub, or gallop. No JVD, no dependent edema. GI: Abdomen soft, minimally tender, non-distended, with reducible hernia, stable,  normoactive bowel sounds.  Ext: Warm, no deformities Skin: No new rashes, lesions or ulcers on visualized skin. Scattered ecchymoses stable. Neuro: Alert and oriented. No focal neurological deficits. Psych: Judgement and insight appear fair. Mood euthymic & affect congruent. Behavior is appropriate.    Data Reviewed: I have personally reviewed following labs and imaging studies  CBC: Recent Labs  Lab 12/26/19 1004 12/26/19 1004 12/27/19 0233 12/28/19 0354 12/30/19 0354 12/31/19 0512 01/01/20 0410  WBC 0.8*   < > 1.0* 2.0* 2.6* 2.9* 3.4*  NEUTROABS 0.4*  --   --  1.3*  --   --   --   HGB 17.5*   < > 14.4 12.5* 11.8* 12.2* 11.3*  HCT 50.9   < > 41.2 36.9* 34.0* 35.1* 33.7*  MCV 90.1   < > 90.0 91.3 89.9 89.5 90.1  PLT 130*   < > 85* 60* 67* 76* 102*   < > = values in this interval not displayed.   Basic Metabolic Panel: Recent Labs  Lab 12/28/19 0354 12/28/19 0354 12/29/19 4315 12/29/19 2355 12/30/19 0354 12/31/19 0512 01/01/20 0410  NA 140  --  142  --  139 135 137  K 2.6*   < > 2.3* 2.6* 2.8* 2.9* 3.6  CL 104  --  106  --  106 103 104  CO2 25  --  27  --  27 26 26   GLUCOSE 93  --  148*  --  131* 119* 139*  BUN 37*  --  28*  --  17 14 13   CREATININE 0.91  --  0.73  --  0.66 0.61 0.64  CALCIUM 8.0*  --  8.0*  --  8.0* 8.0* 8.1*  MG 1.6*  --  1.7  --  1.5* 1.5* 1.7  PHOS 1.7*  --  1.1*  --  1.9* 1.9* 2.3*   < > = values in this interval not displayed.   GFR: Estimated Creatinine Clearance: 83 mL/min (by C-G formula based on SCr of 0.64 mg/dL). Liver Function Tests: Recent  Labs  Lab 12/26/19 1004 12/28/19 0354 12/29/19 0438 12/30/19 0354  AST 23 17 16 20   ALT 31 20 17 18   ALKPHOS 71 40 43 44  BILITOT 1.4* 0.7 0.7 0.7  PROT 7.1 4.5* 4.5* 4.4*  ALBUMIN 4.0 2.2* 2.4* 2.3*   Recent Labs  Lab 12/26/19 1004  LIPASE 18   No results for input(s): AMMONIA in the last 168 hours. Coagulation Profile: Recent Labs  Lab 12/26/19 1004  INR 1.1   Cardiac Enzymes: No results for input(s): CKTOTAL, CKMB, CKMBINDEX, TROPONINI in the last 168 hours. BNP (last 3 results) No results for input(s): PROBNP in the last 8760 hours. HbA1C: No results for input(s): HGBA1C in the last 72 hours. CBG: Recent Labs  Lab 12/31/19 2346 01/01/20 0343 01/01/20 0739 01/01/20 1144 01/01/20 1632  GLUCAP 119* 125* 127* 164* 159*   Lipid Profile: No results for input(s): CHOL, HDL, LDLCALC, TRIG, CHOLHDL, LDLDIRECT in the last 72 hours. Thyroid Function Tests: No results for input(s): TSH, T4TOTAL, FREET4, T3FREE, THYROIDAB in the last 72 hours. Anemia Panel: No results for input(s): VITAMINB12, FOLATE, FERRITIN, TIBC, IRON, RETICCTPCT in the last 72 hours. Urine analysis:    Component Value Date/Time   COLORURINE AMBER (A) 12/26/2019 1131   APPEARANCEUR HAZY (A) 12/26/2019 1131   LABSPEC 1.018 12/26/2019 1131   PHURINE 5.0 12/26/2019 1131   GLUCOSEU NEGATIVE 12/26/2019 1131   HGBUR MODERATE (A) 12/26/2019 1131   BILIRUBINUR NEGATIVE 12/26/2019 1131   KETONESUR 20 (A) 12/26/2019 1131   PROTEINUR 30 (A) 12/26/2019 1131   UROBILINOGEN 1.0 01/19/2015 1312   NITRITE NEGATIVE 12/26/2019 1131   LEUKOCYTESUR SMALL (A) 12/26/2019 1131   Recent Results (from the past 240 hour(s))  Culture, blood (Routine x 2)     Status: None   Collection Time: 12/26/19 10:04 AM   Specimen: BLOOD  Result Value Ref Range Status   Specimen Description   Final    BLOOD LEFT ANTECUBITAL Performed at [facility] [address] [place], [place] [address]    Special  Requests   Final    BOTTLES DRAWN AEROBIC AND ANAEROBIC Blood Culture results may not be optimal due to an inadequate volume of blood received in culture bottles Performed at [facility] [address]  [place], [place] [address]    Culture   Final    NO GROWTH 5 DAYS Performed at [facility], [place] [address] [place], [place] [address]    Report Status 12/31/2019 FINAL  Final  Culture, blood (Routine x 2)     Status: None   Collection Time: 12/26/19 10:09 AM   Specimen: BLOOD  Result Value Ref Range Status   Specimen Description   Final    BLOOD RIGHT ANTECUBITAL Performed at [facility] [address] [place], [place] [address]    Special Requests   Final    BOTTLES DRAWN AEROBIC AND ANAEROBIC Blood Culture adequate volume Performed at [facility] [address], [place], [place] [address]    Culture   Final    NO GROWTH 5 DAYS Performed at [facility], [place] [address], [place], [place] [address]    Report Status 12/31/2019 FINAL  Final  SARS Coronavirus 2 by RT PCR (hospital order, performed in [facility] hospital lab) Nasopharyngeal Nasopharyngeal Swab     Status: None   Collection Time: 12/26/19 12:34 PM   Specimen: Nasopharyngeal Swab  Result Value Ref Range Status   SARS Coronavirus 2 NEGATIVE NEGATIVE Final    Comment: (NOTE) SARS-CoV-2 target nucleic acids are NOT DETECTED.  The SARS-CoV-2 RNA is generally detectable in upper and lower respiratory specimens during the acute phase of infection. The lowest concentration of SARS-CoV-2 viral copies this assay can detect is 250 copies / mL. A negative result does not preclude SARS-CoV-2 infection and should not be used as the sole basis for treatment or other patient management decisions.  A negative result may occur with improper specimen collection / handling, submission of specimen other than nasopharyngeal swab, presence of viral mutation(s)  within the areas targeted by this assay, and inadequate number of viral copies (<250 copies / mL). A negative result must be combined with clinical observations, patient history, and epidemiological information.  Fact Sheet for Patients:   StrictlyIdeas.no  Fact Sheet for Healthcare Providers: BankingDealers.co.za  This test is not yet approved or  cleared by the [place] FDA and has been authorized for detection and/or diagnosis of SARS-CoV-2 by FDA under an Emergency Use Authorization (EUA).  This EUA will remain in effect (meaning this test can be used) for the duration of the COVID-19 declaration under Section 564(b)(1) of the Act, 21 U.S.C. section 360bbb-3(b)(1), unless the authorization is terminated or revoked sooner.  Performed at [facility], [place] [address] [place], [place] [address]       Radiology Studies: No results found.  Scheduled Meds:  alteplase  2 mg Intracatheter Once   alteplase  2 mg Intracatheter Once   amLODipine  10 mg Oral Daily   arformoterol  15 mcg Nebulization BID   atorvastatin  40 mg Oral Daily   Chlorhexidine Gluconate Cloth  6 each Topical Daily   ezetimibe  5 mg Oral Daily   feeding supplement  1 Container Oral TID BM   finasteride  5 mg Oral Daily   fluticasone  2 spray Each Nare Daily   insulin aspart  0-9 Units Subcutaneous Q4H   magnesium oxide  400 mg Oral Daily   nicotine  21 mg Transdermal Daily   nystatin  5 mL Oral QID   pantoprazole  40 mg Oral Daily   sodium chloride flush  10-40 mL Intracatheter Q12H   tamsulosin  0.4 mg Oral QPC supper  Continuous Infusions:  0.9 % NaCl with KCl 20 mEq / L 60 mL/hr at 01/01/20 0606   ceFEPime (MAXIPIME) IV 2 g (01/01/20 1300)   metronidazole 500 mg (01/01/20 1150)   TPN ADULT (ION) 40 mL/hr at 12/31/19 1754   TPN ADULT (ION)       LOS: 6 days   Time spent: 35 minutes.  Patrecia Pour,  MD [facility] [email or web address] 01/01/2020, 5:34 PM

## 2020-01-02 ENCOUNTER — Inpatient Hospital Stay: Payer: Medicare Other

## 2020-01-02 ENCOUNTER — Encounter: Payer: Self-pay | Admitting: *Deleted

## 2020-01-02 DIAGNOSIS — C3492 Malignant neoplasm of unspecified part of left bronchus or lung: Secondary | ICD-10-CM

## 2020-01-02 DIAGNOSIS — N390 Urinary tract infection, site not specified: Secondary | ICD-10-CM

## 2020-01-02 LAB — CBC WITH DIFFERENTIAL/PLATELET
Abs Immature Granulocytes: 0.17 10*3/uL — ABNORMAL HIGH (ref 0.00–0.07)
Basophils Absolute: 0 10*3/uL (ref 0.0–0.1)
Basophils Relative: 1 %
Eosinophils Absolute: 0.4 10*3/uL (ref 0.0–0.5)
Eosinophils Relative: 13 %
HCT: 35.1 % — ABNORMAL LOW (ref 39.0–52.0)
Hemoglobin: 12.3 g/dL — ABNORMAL LOW (ref 13.0–17.0)
Immature Granulocytes: 5 %
Lymphocytes Relative: 29 %
Lymphs Abs: 1 10*3/uL (ref 0.7–4.0)
MCH: 31.5 pg (ref 26.0–34.0)
MCHC: 35 g/dL (ref 30.0–36.0)
MCV: 89.8 fL (ref 80.0–100.0)
Monocytes Absolute: 0.4 10*3/uL (ref 0.1–1.0)
Monocytes Relative: 12 %
Neutro Abs: 1.4 10*3/uL — ABNORMAL LOW (ref 1.7–7.7)
Neutrophils Relative %: 40 %
Platelets: 125 10*3/uL — ABNORMAL LOW (ref 150–400)
RBC: 3.91 MIL/uL — ABNORMAL LOW (ref 4.22–5.81)
RDW: 12.9 % (ref 11.5–15.5)
WBC: 3.4 10*3/uL — ABNORMAL LOW (ref 4.0–10.5)
nRBC: 0 % (ref 0.0–0.2)

## 2020-01-02 LAB — PHOSPHORUS: Phosphorus: 2.5 mg/dL (ref 2.5–4.6)

## 2020-01-02 LAB — TRIGLYCERIDES: Triglycerides: 137 mg/dL (ref <150)

## 2020-01-02 LAB — PREALBUMIN: Prealbumin: 18.8 mg/dL (ref 18–38)

## 2020-01-02 LAB — COMPREHENSIVE METABOLIC PANEL
ALT: 19 U/L (ref 0–44)
AST: 18 U/L (ref 15–41)
Albumin: 2.7 g/dL — ABNORMAL LOW (ref 3.5–5.0)
Alkaline Phosphatase: 54 U/L (ref 38–126)
Anion gap: 7 (ref 5–15)
BUN: 12 mg/dL (ref 8–23)
CO2: 23 mmol/L (ref 22–32)
Calcium: 8.5 mg/dL — ABNORMAL LOW (ref 8.9–10.3)
Chloride: 106 mmol/L (ref 98–111)
Creatinine, Ser: 0.74 mg/dL (ref 0.61–1.24)
GFR calc Af Amer: >60 mL/min (ref >60)
GFR calc non Af Amer: >60 mL/min (ref >60)
Glucose, Bld: 102 mg/dL — ABNORMAL HIGH (ref 70–99)
Potassium: 4 mmol/L (ref 3.5–5.1)
Sodium: 136 mmol/L (ref 135–145)
Total Bilirubin: 0.7 mg/dL (ref 0.3–1.2)
Total Protein: 5.1 g/dL — ABNORMAL LOW (ref 6.5–8.1)

## 2020-01-02 LAB — GLUCOSE, CAPILLARY
Glucose-Capillary: 95 mg/dL (ref 70–99)
Glucose-Capillary: 121 mg/dL — ABNORMAL HIGH (ref 70–99)
Glucose-Capillary: 124 mg/dL — ABNORMAL HIGH (ref 70–99)

## 2020-01-02 LAB — MAGNESIUM: Magnesium: 1.6 mg/dL — ABNORMAL LOW (ref 1.7–2.4)

## 2020-01-02 MED ORDER — K-PHOS-NEUTRAL 155-852-130 MG PO TABS: 2.0000 | ORAL_TABLET | Freq: Two times a day (BID) | ORAL | 0 refills | Status: DC

## 2020-01-02 MED ORDER — MAGNESIUM OXIDE 400 (241.3 MG) MG PO TABS: 400.0000 mg | ORAL_TABLET | Freq: Two times a day (BID) | ORAL | 0 refills | Status: DC

## 2020-01-02 MED ORDER — KATE FARMS STANDARD 1.4 PO LIQD
325.0000 mL | Freq: Two times a day (BID) | ORAL | Status: DC
  Filled 2020-01-02: qty 325

## 2020-01-02 NOTE — Research (Signed)
01/02/2020  Research - [facility] [id number] study - INTEGRATION OF IMMUNOTHERAPY INTO ADJUVANT THERAPY FOR RESECTED NSCLC: ALCHEMIST CHEMO-IO (ACCIO) Adverse Event Causality  NSCLC [place] Cisplatin [place] Hx of ventral hernia Gr. 4 ANC  Partial SBO 3 1 1 1 1 1   Small bowel enteritis 3 3 2 3 1 3   Sepsis: SIRS 3 3 3 3 1 4   1  = unrelated 2 = unlikely 3 = possible 4 = probable 5 = definite  Cindy S. Brigitte Pulse BSN, RN, [facility] 01/02/2020 2:37 PM

## 2020-01-02 NOTE — Care Management Important Message (Signed)
Important Message  Patient Details IM Letter given to Gabriel Earing RN Case Manager to present to the Patient Name: Chad Gomez MRN: [id number] Date of Birth: 09-24-1945   Medicare Important Message Given:  Yes     Kerin Salen 01/02/2020, 11:16 AM

## 2020-01-02 NOTE — Discharge Summary (Signed)
Physician Discharge Summary  TZVI ECONOMOU QPY:[id number] DOB: 1945-11-04 DOA: 12/26/2019  PCP: [name], MD  Admit date: 12/26/2019 Discharge date: 01/02/2020  Admitted From: Home Disposition: Home   Recommendations for Outpatient Follow-up:  1. Follow up with PCP in 1-2 weeks 2. Please obtain BMP/CBC in one week 3. Follow up with oncology as scheduled.  Home Health: None Equipment/Devices: None Discharge Condition: Stable CODE STATUS: Full Diet recommendation: Protein-supplemented  Brief/Interim Summary: YOEL KAUFHOLD is a 74 y.o. male with a history of stage IIIa NSCLC s/p lobectomy May 2021 having initiated chemotherapy June 2021, COPD, ongoing tobacco use, PAD s/p aorto-bifemoral bypass 2016, chronic foley catheterization, ventral hernia, T2DM, HTN, and HLD who presented to the ED 7/5 with abdominal pain, weakness, and limited per oral intake. In the ED temperature 99.50F, HR 100's, RR 20's/min. SCr 1.76 from normal baseline, lactic acid 3.4, pancytopenic with WBC 0.8, platelets 130. CXR normal. CT abdomen/pelvis showed multiple thick-walled mild dilated small bowel loops in the mid and lower abdomen probably as a result of partial bowel obstruction due to herniated small bowel loops in the midline anterior abdominalhernia. NG tube inserted, placed to suction, surgery consulted, and the patient was admitted under hospitalist service. Failed clamping trials. TPN was recommended and was started 7/7 through PICC placed 7/6. Severe electrolyte derangements were monitored and managed for several days with eventual improvement. The patient's SBO resolved, suspected to have been due to enteritis due to chemotherapy with benign exam of hernia. NG tube removed and patient's diet was advanced slowly but successfully. TPN was stopped on the day of discharge, PICC pulled, as patient is able to tolerate po enough to maintain his own nutrition and hydration. See below for more details.    Discharge Diagnoses:  Principal Problem:   SBO (small bowel obstruction) (HCC) Active Problems:   Atherosclerosis of native artery of both lower extremities with intermittent claudication (HCC)   S/P aortobifemoral bypass surgery   Centrilobular emphysema (HCC)   Stage 4 very severe COPD by GOLD classification ([place])   Tobacco abuse   Hypertension   Dyslipidemia   Type 2 diabetes mellitus with complication, without long-term current use of insulin (HCC)   Lung cancer, lingula (HCC)   Leukopenia due to antineoplastic chemotherapy (HCC)   Sepsis ([place])   Ventral hernia   Dehydration   Lactic acidosis   Chronic indwelling Foley catheter   Acute lower UTI   Protein-calorie malnutrition, severe  Ileus vs. partial SBO: Resolved. NG tube removed several days prior to discharge.   Neutropenic fever with lactic acidosis, chemotherapy-induced pancytopenia, and sepsis due to enteritis: Lactate has cleared.  - Continued broad IV antibiotics though cultures now negative x5 days, remains afebrile. Has completed empiric therapy and ANC normalized.  - Granix given 7/5, counts improved  Ventral hernia: Reducible. No current plans for operative management.  Severe protein-calorie malnutrition: Prealbumin baseline 9.8. Albumin 2.2.  - With need for ongoing chemotherapy, would consider PICC and TPN if enteritis recurs  - Supplement protein with boost, pt has tolerated this well in the past.  AKI: Improved, does not appear to have CKD.   Hypomagnesemia, hypophosphatemia, hypokalemia: Showing sustained improvement in levels. - Monitor at follow up, supplements prescribed at discharge.    NSCLC of lingula stage IIIa s/p LUL resection 10/28/2019 and first Tx 12/19/2019 with pembrolizumab, pemetrexid, cisplatin:  - Onc follow up as scheduled.  Stage IV COPD, ongoing tobacco use: No exacerbation currently. - Nicotine patch, smoking cessation counseling provided.  -  Continue home medications  and prn BDs.   BPH, chronic urinary retention with indwelling foley catheterization:  - Restarted home finasteride, flomax  - Exchanged foley catheter 12/30/2019  PAD s/p bypass:  - Continue statin, will restart aspirin   T2DM: Well-controlled with HbA1c 6.1%.  - Continue home medication  HTN:  - Restart home medications now that metabolic derangements improved.   Thrush: Improving - Continue nystatin  Discharge Instructions Discharge Instructions    Discharge instructions   Complete by: As directed    You have completed antibiotics and are stable for discharge with the following recommendations:  - Continue taking plenty protein and take magnesium supplement twice daily (new prescription sent) and K-phos supplement twice daily as prescribed.  - Follow up with Dr. Julien Nordmann as scheduled in 1 week or seek medical attention sooner if you develop shortness of breath, chest pain, fever, worsening abdominal pain.     Allergies as of 01/02/2020      Reactions   Morphine And Related Anxiety, Other (See Comments)   Combative, incoherent     Tape Other (See Comments), Rash   Surgical tape Adhesive tape causes blisters   Amoxicillin Other (See Comments)   Severe stomach pain   Naproxen Swelling   Eyes swell      Medication List    TAKE these medications   acetaminophen 500 MG tablet Commonly known as: TYLENOL Take 2 tablets (1,000 mg total) by mouth every 6 (six) hours as needed for mild pain.   albuterol 108 (90 Base) MCG/ACT inhaler Commonly known as: VENTOLIN HFA Inhale 2 puffs into the lungs every 6 (six) hours as needed for wheezing or shortness of breath.   amLODipine 10 MG tablet Commonly known as: NORVASC Take 10 mg by mouth daily.   aspirin EC 81 MG tablet Take 81 mg by mouth daily.   atorvastatin 40 MG tablet Commonly known as: LIPITOR Take 40 mg by mouth daily.   cetirizine 10 MG tablet Commonly known as: ZYRTEC Take 10 mg by mouth daily.    dextromethorphan-guaiFENesin 30-600 MG 12hr tablet Commonly known as: MUCINEX DM Take 1 tablet by mouth 2 (two) times daily as needed for cough.   ezetimibe 10 MG tablet Commonly known as: ZETIA Take 5 mg by mouth daily.   finasteride 5 MG tablet Commonly known as: PROSCAR Take 5 mg by mouth daily.   fluticasone 50 MCG/ACT nasal spray Commonly known as: FLONASE Place 2 sprays into both nostrils daily.   indapamide 1.25 MG tablet Commonly known as: LOZOL Take 1.25 mg by mouth daily.   K-Phos-Neutral 155-852-130 MG Tabs Take 2 tablets by mouth in the morning and at bedtime.   magnesium oxide 400 (241.3 Mg) MG tablet Commonly known as: MAG-OX Take 1 tablet (400 mg total) by mouth 2 (two) times daily. What changed: when to take this   metFORMIN 500 MG 24 hr tablet Commonly known as: GLUCOPHAGE-XR Take 500-1,000 mg by mouth See admin instructions. Take 500 mg every morning and take 1000 mg at supper   potassium citrate 10 MEQ (1080 MG) SR tablet Commonly known as: UROCIT-K Take 10 mEq by mouth in the morning and at bedtime.   prochlorperazine 10 MG tablet Commonly known as: COMPAZINE Take 1 tablet (10 mg total) by mouth every 6 (six) hours as needed.   tamsulosin 0.4 MG Caps capsule Commonly known as: FLOMAX Take 0.4 mg by mouth daily after supper.   Tiotropium Bromide-Olodaterol 2.5-2.5 MCG/ACT Aers Commonly known as: Stiolto Respimat  Inhale 2 puffs into the lungs daily.   traMADol 50 MG tablet Commonly known as: ULTRAM Take 1 tablet (50 mg total) by mouth every 6 (six) hours as needed (mild pain).   Vitamin D-3 25 MCG (1000 UT) Caps Take 1,000 Units by mouth daily.       Follow-up Information    [name], MD. Schedule an appointment as soon as possible for a visit.   Specialty: Internal Medicine Contact information: [address] [place] [address] [phone]        Minus Breeding, MD .   Specialty: Cardiology Contact  information: [address] [place] [address] [phone]        Curt Bears, MD. Go on 01/09/2020.   Specialty: Oncology Contact information: [address] [place] [address] [phone]              Allergies  Allergen Reactions  . Morphine And Related Anxiety and Other (See Comments)    Combative, incoherent    . Tape Other (See Comments) and Rash    Surgical tape Adhesive tape causes blisters  . Amoxicillin Other (See Comments)    Severe stomach pain  . Naproxen Swelling    Eyes swell    Consultations:  General surgery  Oncology  Pharmacy  Dietitian  Procedures/Studies: DG Chest 2 View  Result Date: 12/26/2019 CLINICAL DATA:  Congestion and cough this week. EXAM: CHEST - 2 VIEW COMPARISON:  December 08, 2019 FINDINGS: The heart size and mediastinal contours are within normal limits. Both lungs are clear. The visualized skeletal structures are unremarkable. IMPRESSION: No active cardiopulmonary disease. Electronically Signed   By: Abelardo Diesel M.D.   On: 12/26/2019 11:04   DG Chest 2 View  Result Date: 12/08/2019 CLINICAL DATA:  Lung cancer status post left upper lobe wedge resection on 10/28/2019. No current complaints. EXAM: CHEST - 2 VIEW COMPARISON:  11/17/2019 FINDINGS: The cardiomediastinal silhouette is unchanged with normal heart size. Aortic atherosclerosis is noted. The lungs are hyperinflated. Postsurgical changes are noted in the anterior left mid lung. No acute airspace consolidation, edema, pleural effusion, pneumothorax is identified. Soft tissue emphysema in the left chest wall has nearly completely resolved. No acute osseous abnormality is seen. IMPRESSION: Postsurgical changes with near complete resolution of subcutaneous emphysema. No evidence of acute cardiopulmonary process. Electronically Signed   By: Logan Bores M.D.   On: 12/08/2019 09:12   MR Brain W Wo Contrast  Result Date: 12/16/2019 CLINICAL DATA:   Lung cancer staging. EXAM: MRI HEAD WITHOUT AND WITH CONTRAST TECHNIQUE: Multiplanar, multiecho pulse sequences of the brain and surrounding structures were obtained without and with intravenous contrast. CONTRAST:  39mL MULTIHANCE GADOBENATE DIMEGLUMINE 529 MG/ML IV SOLN COMPARISON:  None. FINDINGS: Brain: No acute infarct, mass, midline shift, or extra-axial fluid collection is identified. T2 hyperintensities in the cerebral white matter and pons are nonspecific but compatible with mild-to-moderate chronic small vessel ischemic disease. Chronic lacunar infarcts are noted in the right corona radiata, basal ganglia, and left thalamus. There is a chronic microhemorrhage in the left cerebellar hemisphere. No abnormal enhancement is identified. There is mild cerebral atrophy. Vascular: Major intracranial vascular flow voids are preserved. Skull and upper cervical spine: Unremarkable bone marrow signal. Sinuses/Orbits: Unremarkable orbits. Moderate right sphenoid and mild bilateral maxillary sinus mucosal thickening. Small volume secretions in the left maxillary sinus. Clear mastoid air cells. Other: None. IMPRESSION: 1. No evidence of intracranial metastases. 2. Mild-to-moderate chronic small vessel ischemic disease. Electronically  Signed   By: Logan Bores M.D.   On: 12/16/2019 08:47   CT ABDOMEN PELVIS W CONTRAST  Result Date: 12/26/2019 CLINICAL DATA:  Acute abdomen pain.  History of lung cancer. EXAM: CT ABDOMEN AND PELVIS WITH CONTRAST TECHNIQUE: Multidetector CT imaging of the abdomen and pelvis was performed using the standard protocol following bolus administration of intravenous contrast. CONTRAST:  45mL OMNIPAQUE IOHEXOL 300 MG/ML  SOLN COMPARISON:  April 05, 2019 FINDINGS: Lower chest: Mild dependent atelectasis of right lung base is identified. The heart size is normal. Hepatobiliary: Stable enhancing low-density lesion is identified in the posterior segment right lobe liver unchanged compared prior  CT. The liver is otherwise. Gallstones in gallbladder. No inflammation is noted surrounding the gallbladder. The biliary tree is normal. Pancreas: Unremarkable. No pancreatic ductal dilatation or surrounding inflammatory changes. Spleen: Normal in size without focal abnormality. Adrenals/Urinary Tract: 1 cm nodule is identified in the left adrenal gland unchanged. The right adrenal gland is normal. There is atrophy of the right kidney unchanged. There left kidney cyst. There is no hydronephrosis bilaterally. Foley catheter is identified in a decompressed bladder. Stomach/Bowel: There are multiple thick wall mild dilated small bowel loops in the mid and lower abdomen probably as a result of partial bowel obstruction due to herniated small bowel loops in the midline 10 cm anterior abdominal hernia. Colon is normal. The appendix is not seen but no inflammation is noted around cecum. Vascular/Lymphatic: Aortic atherosclerosis. No enlarged abdominal or pelvic lymph nodes. Reproductive: Enlarged prostate with calcifications are identified. Other: Midline anterior abdominal hernia containing small bowel are identified as previously described. Subcutaneous air is identified in the anterior right abdominal and pelvic subcutaneous fat, source indeterminate. Clinical correlation regarding recent injection in the area is suggested. There is no free air in the abdomen and pelvis. Musculoskeletal: Degenerative joint changes of the spine are noted. IMPRESSION: 1. Multiple thick wall mild dilated small bowel loops in the mid and lower abdomen probably as a result of partial bowel obstruction due to herniated small bowel loops in the midline anterior abdominal hernia. 2. Subcutaneous air is identified in the anterior right abdominal and pelvic subcutaneous fat, source indeterminate. Clinical correlation regarding recent injection in the area is suggested. There is no free air in the abdomen and pelvis. 3. Aortic atherosclerosis.  These results were called by telephone at the time of interpretation on 12/26/2019 at 12:57 pm to provider [facility] , who verbally acknowledged these results. Aortic Atherosclerosis (ICD10-I70.0). Electronically Signed   By: Abelardo Diesel M.D.   On: 12/26/2019 13:01   DG CHEST PORT 1 VIEW  Result Date: 01/01/2020 CLINICAL DATA:  74 year old male with nonfunctioning right PICC line. Query positioning. EXAM: PORTABLE CHEST 1 VIEW COMPARISON:  12/26/2019 chest radiographs and earlier. FINDINGS: Portable AP semi upright view at 2331 hours. New right side PICC line. The tip is at the level of the confluence of the right innominate vein and SVC, just above the level of the aortic arch. The line might be twisted or kinked near the skin site at the right arm (arrow). Stable lung volumes and mediastinal contours with tortuous, calcified thoracic aorta. Visualized tracheal air column is within normal limits. No pneumothorax, pulmonary edema or pleural effusion. Mildly increased right lung base patchy opacity. Stable ventilation elsewhere. IMPRESSION: 1. Right side PICC line tip at the confluence of the right innominate vein and SVC. Note that the line might be twisted or kinked near the skin site at the right arm. 2.  Increased right lung base patchy opacity since 12/26/2019. Consider atelectasis or infection. Electronically Signed   By: Genevie Ann M.D.   On: 01/01/2020 23:44   [place] EKG SITE RITE  Result Date: 12/27/2019 If Site Rite image not attached, placement could not be confirmed due to current cardiac rhythm.    Subjective: Feels well, wants to go home. Has birthday coming up. Has eaten 80% of meals of late, no nausea, manageable abdominal pain which is significantly improved not requiring narcotics.   Discharge Exam: Vitals:   01/02/20 0512 01/02/20 0940  BP: (!) 152/96   Pulse: 97   Resp:    Temp: 98.5 F (36.9 C)   SpO2: 96% 95%   General: Pt is alert, awake, not in acute  distress Cardiovascular: RRR, S1/S2 +, no rubs, no gallops Respiratory: CTA bilaterally, no wheezing, no rhonchi Abdominal: Soft, NT, ND, bowel sounds + Extremities: No edema, no cyanosis  Labs: BNP (last 3 results) No results for input(s): BNP in the last 8760 hours. Basic Metabolic Panel: Recent Labs  Lab 12/29/19 0438 12/29/19 0438 12/29/19 2355 12/30/19 0354 12/31/19 0512 01/01/20 0410 01/02/20 0459  NA 142  --   --  139 135 137 136  K 2.3*   < > 2.6* 2.8* 2.9* 3.6 4.0  CL 106  --   --  106 103 104 106  CO2 27  --   --  27 26 26 23   GLUCOSE 148*  --   --  131* 119* 139* 102*  BUN 28*  --   --  17 14 13 12   CREATININE 0.73  --   --  0.66 0.61 0.64 0.74  CALCIUM 8.0*  --   --  8.0* 8.0* 8.1* 8.5*  MG 1.7  --   --  1.5* 1.5* 1.7 1.6*  PHOS 1.1*  --   --  1.9* 1.9* 2.3* 2.5   < > = values in this interval not displayed.   Liver Function Tests: Recent Labs  Lab 12/28/19 0354 12/29/19 0438 12/30/19 0354 01/02/20 0459  AST 17 16 20 18   ALT 20 17 18 19   ALKPHOS 40 43 44 54  BILITOT 0.7 0.7 0.7 0.7  PROT 4.5* 4.5* 4.4* 5.1*  ALBUMIN 2.2* 2.4* 2.3* 2.7*   No results for input(s): LIPASE, AMYLASE in the last 168 hours. No results for input(s): AMMONIA in the last 168 hours. CBC: Recent Labs  Lab 12/28/19 0354 12/30/19 0354 12/31/19 0512 01/01/20 0410 01/02/20 0459  WBC 2.0* 2.6* 2.9* 3.4* 3.4*  NEUTROABS 1.3*  --   --   --  1.4*  HGB 12.5* 11.8* 12.2* 11.3* 12.3*  HCT 36.9* 34.0* 35.1* 33.7* 35.1*  MCV 91.3 89.9 89.5 90.1 89.8  PLT 60* 67* 76* 102* 125*   Cardiac Enzymes: No results for input(s): CKTOTAL, CKMB, CKMBINDEX, TROPONINI in the last 168 hours. BNP: Invalid input(s): POCBNP CBG: Recent Labs  Lab 01/01/20 1632 01/01/20 2219 01/01/20 2352 01/02/20 0348 01/02/20 0752  GLUCAP 159* 106* 112* 95 121*   D-Dimer No results for input(s): DDIMER in the last 72 hours. Hgb A1c No results for input(s): HGBA1C in the last 72 hours. Lipid  Profile Recent Labs    01/02/20 0459  TRIG 137   Thyroid function studies No results for input(s): TSH, T4TOTAL, T3FREE, THYROIDAB in the last 72 hours.  Invalid input(s): FREET3 Anemia work up No results for input(s): VITAMINB12, FOLATE, FERRITIN, TIBC, IRON, RETICCTPCT in the last 72 hours. Urinalysis  Component Value Date/Time   COLORURINE AMBER (A) 12/26/2019 1131   APPEARANCEUR HAZY (A) 12/26/2019 1131   LABSPEC 1.018 12/26/2019 1131   PHURINE 5.0 12/26/2019 1131   GLUCOSEU NEGATIVE 12/26/2019 1131   HGBUR MODERATE (A) 12/26/2019 1131   BILIRUBINUR NEGATIVE 12/26/2019 1131   KETONESUR 20 (A) 12/26/2019 1131   PROTEINUR 30 (A) 12/26/2019 1131   UROBILINOGEN 1.0 01/19/2015 1312   NITRITE NEGATIVE 12/26/2019 1131   LEUKOCYTESUR SMALL (A) 12/26/2019 1131    Microbiology Recent Results (from the past 240 hour(s))  Culture, blood (Routine x 2)     Status: None   Collection Time: 12/26/19 10:04 AM   Specimen: BLOOD  Result Value Ref Range Status   Specimen Description   Final    BLOOD LEFT ANTECUBITAL Performed at [facility] [address], [place], [place] [address]    Special Requests   Final    BOTTLES DRAWN AEROBIC AND ANAEROBIC Blood Culture results may not be optimal due to an inadequate volume of blood received in culture bottles Performed at [facility] [address], [place], [place] [address]    Culture   Final    NO GROWTH 5 DAYS Performed at [facility], [place] [address], [place], [place] [address]    Report Status 12/31/2019 FINAL  Final  Culture, blood (Routine x 2)     Status: None   Collection Time: 12/26/19 10:09 AM   Specimen: BLOOD  Result Value Ref Range Status   Specimen Description   Final    BLOOD RIGHT ANTECUBITAL Performed at [facility] [address], [place], [place] [address]    Special Requests   Final    BOTTLES DRAWN AEROBIC AND ANAEROBIC Blood Culture  adequate volume Performed at [facility] [address], [place], [place] [address]    Culture   Final    NO GROWTH 5 DAYS Performed at [facility], [place] [address], [place], [place] [address]    Report Status 12/31/2019 FINAL  Final  SARS Coronavirus 2 by RT PCR (hospital order, performed in [facility] hospital lab) Nasopharyngeal Nasopharyngeal Swab     Status: None   Collection Time: 12/26/19 12:34 PM   Specimen: Nasopharyngeal Swab  Result Value Ref Range Status   SARS Coronavirus 2 NEGATIVE NEGATIVE Final    Comment: (NOTE) SARS-CoV-2 target nucleic acids are NOT DETECTED.  The SARS-CoV-2 RNA is generally detectable in upper and lower respiratory specimens during the acute phase of infection. The lowest concentration of SARS-CoV-2 viral copies this assay can detect is 250 copies / mL. A negative result does not preclude SARS-CoV-2 infection and should not be used as the sole basis for treatment or other patient management decisions.  A negative result may occur with improper specimen collection / handling, submission of specimen other than nasopharyngeal swab, presence of viral mutation(s) within the areas targeted by this assay, and inadequate number of viral copies (<250 copies / mL). A negative result must be combined with clinical observations, patient history, and epidemiological information.  Fact Sheet for Patients:   StrictlyIdeas.no  Fact Sheet for Healthcare Providers: BankingDealers.co.za  This test is not yet approved or  cleared by the [place] FDA and has been authorized for detection and/or diagnosis of SARS-CoV-2 by FDA under an Emergency Use Authorization (EUA).  This EUA will remain in effect (meaning this test can be used) for the duration of the COVID-19 declaration under Section  564(b)(1) of the Act, 21 U.S.C. section 360bbb-3(b)(1), unless the authorization is terminated  or revoked sooner.  Performed at [facility], [place] [address] [place], [place] [address]     Time coordinating discharge: Approximately 40 minutes  Patrecia Pour, MD  [facility] 01/02/2020, 11:19 AM

## 2020-01-02 NOTE — Progress Notes (Signed)
Nutrition Follow-up  DOCUMENTATION CODES:   Severe malnutrition in context of acute illness/injury  INTERVENTION:  - will order [employer] BID, each supplement provides 455 kcal and 20 grams protein. - will order Magic Cup with lunch meals, each supplement provides 290 kcal and 9 grams of protein.  NUTRITION DIAGNOSIS:   Severe Malnutrition related to acute illness, cancer and cancer related treatments as evidenced by moderate fat depletion, moderate muscle depletion. -ongoing  GOAL:   Patient will meet greater than or equal to 90% of their needs -unmet with intakes improving  MONITOR:   PO intake, Supplement acceptance, Labs, Weight trends, Other (Comment) (TPN regimen)  ASSESSMENT:   74 y.o. male with medical history of stage IIIa NSCC of the lung s/p robotic assisted lobectomy 10/2019, undergoing chemotherapy first dose given last week, COPD, ongoing smoking, type 2 DM (A1c: 6.1%), HTN, HLD, PAD s/p bypass, CKD, chronic indwelling Foley. He presented to the ED on 7/5 due to progressively worsening loss of appetite, abdominal pain, and generalized weakness since his chemo treatment. CXR was normal. CT abdomen/pelvis showed multiple thick wall mild dilated small bowel loops in the mid and lower abdomen probably as a result of partial bowel obstruction d/t herniated small bowel loops in the midline anterior abdominal hernia. General Surgery consulted and NGT placed.  Diet advanced from NPO to CLD on 7/7 at 0942, to [place] on 7/9 at 1047, and to Soft on 7/10 at 1117. He ate 75% of breakfast yesterday (385 kcal, 9 grams protein). No discomfort with PO intakes and was able to eat a similar amount for breakfast this AM. TPN stopped as of yesterday.   Discharge order for discharge home entered a short time ago. Discharge summary not yet entered.     Labs reviewed; CBGs: 95, 121, 124 mg/dl, Ca: 8.5 mg/dl, Mg; 1.6 mg/dl, triglycerides: 137 mg/dl today.  Medications reviewed; sliding scale  novolog, 400 mg mag-ox/day, 5 ml mycostatin QID, 40 mg oral protonix/day.  IVF; NS-20 mEq KCl @ 60 ml/hr.   Diet Order:   Diet Order            DIET SOFT Room service appropriate? Yes; Fluid consistency: Thin  Diet effective now                 EDUCATION NEEDS:   Not appropriate for education at this time  Skin:  Skin Assessment: Reviewed RN Assessment  Last BM:  7/12 (ongoing type 7)  Height:   Ht Readings from Last 1 Encounters:  12/26/19 5\' 10"  (1.778 m)    Weight:   Wt Readings from Last 1 Encounters:  12/26/19 70.3 kg    Estimated Nutritional Needs:  Kcal:  2125-2320 kcal Protein:  110-125 grams Fluid:  >/= 2.1 L/day     Jarome Matin, MS, RD, LDN, CNSC Inpatient Clinical Dietitian RD pager # available in [facility]  After hours/weekend pager # available in [facility]

## 2020-01-02 NOTE — Progress Notes (Signed)
Discharge instructions given. Patient verbalized understanding and all questions were answered.  ?

## 2020-01-03 ENCOUNTER — Telehealth: Payer: Self-pay | Admitting: Medical Oncology

## 2020-01-03 NOTE — Telephone Encounter (Signed)
Home from hospital after SBO . He is not sure he can take treatment next week. I told him to keep appts next Monday .  He confirmed appt.

## 2020-01-05 ENCOUNTER — Encounter: Payer: Self-pay | Admitting: *Deleted

## 2020-01-05 ENCOUNTER — Other Ambulatory Visit: Payer: Self-pay | Admitting: *Deleted

## 2020-01-05 NOTE — Patient Outreach (Signed)
[facility]) Care Management  01/05/2020  Chad Gomez [birth year] [id number]   Subjective: Telephone call to patient's home / mobile number, spoke with patient's wife / designated party release [name]), stated patient's name, date of birth, and address.   Discussed patient's recent hospitalization.  Wife states patient is doing so so, 11/17/2019 surgeon follow up appointment went well, patient not eating well at all, providers are aware, encouraging patient to eat want ever he wants, and without any dietary restrictions.  Discussed the following eating strategies: eating smaller frequent meals, trying things he likes to eat like chicken noodle or tomato soup if it does not irritate stomach or have too much salt, put foods in a smaller container, wife voices understanding, and states she will give it a try, she is willing to try anything that may help patient to eat better.   Wife states patients blood sugars are stable and no longer low, as they were in the hospital.  States patient has a follow up appointment with oncologist on 01/09/2020 and will primary MD on 8/2/202.  States 01/23/2020  primary MD appointment  was the first available appointment, is aware to call for sooner appointment if new issues arise, no new issues at this time, she is aware of signs/ symptoms to report, how to reach provider if needed after hours, when to go to ED, and / or call 911.   Wife states patient is having bowel movements and has had no diarrhea at this time.  Wife states patient does not have any education material, EMMI follow up, care coordination, care management, disease monitoring, transportation, community resource, or pharmacy needs at this time.  Wife is aware that 30 day EMMI automated call follow up completed.   States she is very appreciative of the follow up, is in agreement  to receive 1 additional follow up call to assess for further CM needs, after patient's 01/23/2020 MD appointment.   States she has this RNCM's and [facility] contact information, will call if assistance needed prior to next patent outreach.      Objective:Per KPN ([employer], point of care tool) and chart review,patient hospitalized 10/28/2019 - 11/05/2019 for lung cancer,Non-small-cell lung cancer, left upper lobe lingula, status postBronchoscopy,Robotic-assisted wedge resection of lingula with lymph node biopsy and intercostal nerve blocks, on 11/03/2019, status postVIDEO BRONCHOSCOPY WITH ENDOBRONCHIAL NAVIGATION WITH LUNG MARKINGS,XI ROBOTIC ASSISTED THORASCOPY,-Wedge Resection Lingula Left Upper Lobe, on 10/28/2019. Patient has a history of BPH (benign prostatic hypertrophy),Chronic kidney disease,COPD (chronic obstructive pulmonary disease), diabetes,bladder stone,colon polyps,kidney stones,melanoma excision(left ear),Hyperlipidemia,Hypertension,Peripheral vascular disease,Right ureteral stone,Shingles, chronic indwelling foley,Tobacco abuse,Nephrolithiasis,Centrilobular emphysema,Aortoiliac occlusive disease, and status postaortobifemoral bypass surgeryon 05/26/2016. Patient's A1C was 6.2 on 06/07/2019.      Assessment: Received [employer] EMMI General Discharge Red Flag Alert follow up referral on 11/08/2019. EMMI Red Alert Trigger, Day #1, patient stated no to the following question: Got discharge papers?EMMI follow up completed and will follow up to assess further care management needs.       Plan:RNCM will call patient's wife / designated party releasefor telephone outreach attempt, within 21business days, to assess for further CM needs, and proceed with case closure, within 10 business days if no return call, after 4th unsuccessful call.     Chad Gomez H. Annia Friendly, BSN, [facility] Telephonic CM Phone: [phone] Fax: [phone]

## 2020-01-09 ENCOUNTER — Inpatient Hospital Stay: Payer: Medicare Other | Admitting: *Deleted

## 2020-01-09 ENCOUNTER — Inpatient Hospital Stay: Payer: Medicare Other

## 2020-01-09 ENCOUNTER — Encounter: Payer: Self-pay | Admitting: Internal Medicine

## 2020-01-09 ENCOUNTER — Inpatient Hospital Stay: Payer: Medicare Other | Admitting: Internal Medicine

## 2020-01-09 ENCOUNTER — Other Ambulatory Visit: Payer: Self-pay

## 2020-01-09 ENCOUNTER — Inpatient Hospital Stay: Payer: Medicare Other | Attending: Physician Assistant

## 2020-01-09 VITALS — BP 95/68 | HR 103 | Temp 97.9°F | Resp 14 | Wt 156.8 lb

## 2020-01-09 VITALS — HR 99

## 2020-01-09 DIAGNOSIS — C3492 Malignant neoplasm of unspecified part of left bronchus or lung: Secondary | ICD-10-CM

## 2020-01-09 DIAGNOSIS — Z006 Encounter for examination for normal comparison and control in clinical research program: Secondary | ICD-10-CM

## 2020-01-09 DIAGNOSIS — C3412 Malignant neoplasm of upper lobe, left bronchus or lung: Secondary | ICD-10-CM | POA: Insufficient documentation

## 2020-01-09 DIAGNOSIS — I10 Essential (primary) hypertension: Secondary | ICD-10-CM | POA: Diagnosis not present

## 2020-01-09 DIAGNOSIS — Z5111 Encounter for antineoplastic chemotherapy: Secondary | ICD-10-CM | POA: Insufficient documentation

## 2020-01-09 DIAGNOSIS — C771 Secondary and unspecified malignant neoplasm of intrathoracic lymph nodes: Secondary | ICD-10-CM | POA: Insufficient documentation

## 2020-01-09 DIAGNOSIS — C3410 Malignant neoplasm of upper lobe, unspecified bronchus or lung: Secondary | ICD-10-CM

## 2020-01-09 DIAGNOSIS — R630 Anorexia: Secondary | ICD-10-CM | POA: Insufficient documentation

## 2020-01-09 DIAGNOSIS — Z5112 Encounter for antineoplastic immunotherapy: Secondary | ICD-10-CM

## 2020-01-09 DIAGNOSIS — R634 Abnormal weight loss: Secondary | ICD-10-CM | POA: Insufficient documentation

## 2020-01-09 LAB — CMP (CANCER CENTER ONLY)
ALT: 27 U/L (ref 0–44)
AST: 22 U/L (ref 15–41)
Albumin: 3 g/dL — ABNORMAL LOW (ref 3.5–5.0)
Alkaline Phosphatase: 85 U/L (ref 38–126)
Anion gap: 11 (ref 5–15)
BUN: 17 mg/dL (ref 8–23)
CO2: 27 mmol/L (ref 22–32)
Calcium: 9.8 mg/dL (ref 8.9–10.3)
Chloride: 98 mmol/L (ref 98–111)
Creatinine: 0.94 mg/dL (ref 0.61–1.24)
GFR, Est AFR Am: >60 mL/min (ref >60)
GFR, Estimated: >60 mL/min (ref >60)
Glucose, Bld: 151 mg/dL — ABNORMAL HIGH (ref 70–99)
Potassium: 3.6 mmol/L (ref 3.5–5.1)
Sodium: 136 mmol/L (ref 135–145)
Total Bilirubin: 0.5 mg/dL (ref 0.3–1.2)
Total Protein: 5.9 g/dL — ABNORMAL LOW (ref 6.5–8.1)

## 2020-01-09 LAB — CBC WITH DIFFERENTIAL (CANCER CENTER ONLY)
Abs Immature Granulocytes: 0.04 10*3/uL (ref 0.00–0.07)
Basophils Absolute: 0.1 10*3/uL (ref 0.0–0.1)
Basophils Relative: 1 %
Eosinophils Absolute: 0.1 10*3/uL (ref 0.0–0.5)
Eosinophils Relative: 2 %
HCT: 39.4 % (ref 39.0–52.0)
Hemoglobin: 13.4 g/dL (ref 13.0–17.0)
Immature Granulocytes: 1 %
Lymphocytes Relative: 26 %
Lymphs Abs: 1.4 10*3/uL (ref 0.7–4.0)
MCH: 31.1 pg (ref 26.0–34.0)
MCHC: 34 g/dL (ref 30.0–36.0)
MCV: 91.4 fL (ref 80.0–100.0)
Monocytes Absolute: 0.9 10*3/uL (ref 0.1–1.0)
Monocytes Relative: 17 %
Neutro Abs: 2.9 10*3/uL (ref 1.7–7.7)
Neutrophils Relative %: 53 %
Platelet Count: 350 10*3/uL (ref 150–400)
RBC: 4.31 MIL/uL (ref 4.22–5.81)
RDW: 13.2 % (ref 11.5–15.5)
WBC Count: 5.4 10*3/uL (ref 4.0–10.5)
nRBC: 0 % (ref 0.0–0.2)

## 2020-01-09 LAB — PHOSPHORUS: Phosphorus: 2.6 mg/dL (ref 2.5–4.6)

## 2020-01-09 LAB — MAGNESIUM: Magnesium: 1.4 mg/dL — CL (ref 1.7–2.4)

## 2020-01-09 MED ORDER — SODIUM CHLORIDE 0.9 % IV SOLN
56.0000 mg/m2 | Freq: Once | INTRAVENOUS | Status: AC
  Administered 2020-01-09: 105 mg via INTRAVENOUS
  Filled 2020-01-09: qty 105

## 2020-01-09 MED ORDER — SODIUM CHLORIDE 0.9 % IV SOLN
375.0000 mg/m2 | Freq: Once | INTRAVENOUS | Status: AC
  Administered 2020-01-09: 700 mg via INTRAVENOUS
  Filled 2020-01-09: qty 20

## 2020-01-09 MED ORDER — SODIUM CHLORIDE 0.9 % IV SOLN
Freq: Once | INTRAVENOUS | Status: AC
  Filled 2020-01-09: qty 10

## 2020-01-09 MED ORDER — SODIUM CHLORIDE 0.9 % IV SOLN
150.0000 mg | Freq: Once | INTRAVENOUS | Status: AC
  Administered 2020-01-09: 150 mg via INTRAVENOUS
  Filled 2020-01-09: qty 150

## 2020-01-09 MED ORDER — SODIUM CHLORIDE 0.9 % IV SOLN
10.0000 mg | Freq: Once | INTRAVENOUS | Status: AC
  Administered 2020-01-09: 10 mg via INTRAVENOUS
  Filled 2020-01-09: qty 10

## 2020-01-09 MED ORDER — SODIUM CHLORIDE 0.9 % IV SOLN
Freq: Once | INTRAVENOUS | Status: AC
  Filled 2020-01-09: qty 250

## 2020-01-09 MED ORDER — PALONOSETRON HCL INJECTION 0.25 MG/5ML
INTRAVENOUS | Status: AC
  Filled 2020-01-09: qty 5

## 2020-01-09 MED ORDER — PALONOSETRON HCL INJECTION 0.25 MG/5ML
0.2500 mg | Freq: Once | INTRAVENOUS | Status: AC
  Administered 2020-01-09: 0.25 mg via INTRAVENOUS

## 2020-01-09 NOTE — Progress Notes (Signed)
[place] [phone]   Fax:([phone]) [place], MD [address] [place] [address]  DIAGNOSIS: stageIIIa(T2a, N2,M0)non-small cell lung cancer, adenocarcinoma. Patient presented with a left upper lobe nodule.He was diagnosed in March 2021  Biomarkers/PDL1:  Detected EGFR mutation in exon 21 ? Positive for the EGFR P848L mutation in exon 21. No mutation was detected in exons 12, 18, 19, and 20. The clinical significance of P848L in exon 21 is uncertain. This variant has been reported to behave like functionally silent polymorphism. [name], M.M., et al. Mol Cancer 6, 56 (2007). ? PD-L1 Expression Level: TPS < 1% PD-L1 Expression Status: No Expression NEGATIVE FOR ALK REARRANGEMENT  PRIOR THERAPY: robotic assisted wedge resection of the left upper lobe under the care of Dr. Servando Snare. This was performed on 10/28/2019.  CURRENT THERAPY: Adjuvant systemic chemotherapy on the [facility] A [id number] protocol with cisplatin 70 mg/M2, pemetrexed 500 mg/M2 every 3 weeks in addition to pembrolizumab 400 mg IV every 6 weeks. Start date December 19, 2019.  Status post 1 cycle.  INTERVAL HISTORY: Chad Gomez 74 y.o. male returns to the clinic today for follow-up visit.  The patient is feeling fine today with no concerning complaints except for fatigue and lack of appetite.  He lost more than 5 pounds since his last visit.  He was admitted to the hospital after the first cycle of his treatment with electrolyte imbalance as well as hyponatremia.  He received Granix during his hospitalization.  The patient denied having any current chest pain, shortness of breath, cough or hemoptysis.  He denied having any fever or chills.  He has no current nausea, vomiting, diarrhea or constipation.  He is here today for evaluation before starting cycle #2 of his treatment.  MEDICAL HISTORY: Past Medical History:  Diagnosis  Date  . BPH (benign prostatic hypertrophy)    weak stream  . Chronic kidney disease   . Complication of anesthesia    urinary retention  . COPD (chronic obstructive pulmonary disease) ([place])   . Diabetes mellitus without complication ([place])    Type II  . History of bladder stone   . History of colon polyps    08/2013   pre-canerous  . History of kidney stones   . History of melanoma excision    LEFT EAR  . Hyperlipidemia   . Hypertension   . Iron deficiency   . Lung cancer, lingula ([place]) 11/01/2019  . Open wound    left lower jaw; followed by ENT  . Peripheral vascular disease ([place])   . Right ureteral stone   . Shingles 05-15-2015    ALLERGIES:  is allergic to morphine and related, tape, amoxicillin, and naproxen.  MEDICATIONS:  Current Outpatient Medications  Medication Sig Dispense Refill  . acetaminophen (TYLENOL) 500 MG tablet Take 2 tablets (1,000 mg total) by mouth every 6 (six) hours as needed for mild pain. 30 tablet 0  . albuterol (VENTOLIN HFA) 108 (90 Base) MCG/ACT inhaler Inhale 2 puffs into the lungs every 6 (six) hours as needed for wheezing or shortness of breath. 8 g 3  . amLODipine (NORVASC) 10 MG tablet Take 10 mg by mouth daily.    Marland Kitchen aspirin EC 81 MG tablet Take 81 mg by mouth daily.    Marland Kitchen atorvastatin (LIPITOR) 40 MG tablet Take 40 mg by mouth daily.    . cetirizine (ZYRTEC) 10 MG tablet Take 10 mg by  mouth daily.    . Cholecalciferol (VITAMIN D-3) 1000 units CAPS Take 1,000 Units by mouth daily.     Marland Kitchen dextromethorphan-guaiFENesin (MUCINEX DM) 30-600 MG 12hr tablet Take 1 tablet by mouth 2 (two) times daily as needed for cough.    . ezetimibe (ZETIA) 10 MG tablet Take 5 mg by mouth daily.    . finasteride (PROSCAR) 5 MG tablet Take 5 mg by mouth daily.    . fluticasone (FLONASE) 50 MCG/ACT nasal spray Place 2 sprays into both nostrils daily.     . indapamide (LOZOL) 1.25 MG tablet Take 1.25 mg by mouth daily.    . K Phos Mono-Sod Phos Di & Mono  (K-PHOS-NEUTRAL) [phone] MG TABS Take 2 tablets by mouth in the morning and at bedtime. 120 tablet 0  . magnesium oxide (MAG-OX) 400 (241.3 Mg) MG tablet Take 1 tablet (400 mg total) by mouth 2 (two) times daily. 60 tablet 0  . metFORMIN (GLUCOPHAGE-XR) 500 MG 24 hr tablet Take 500-1,000 mg by mouth See admin instructions. Take 500 mg every morning and take 1000 mg at supper    . potassium citrate (UROCIT-K) 10 MEQ (1080 MG) SR tablet Take 10 mEq by mouth in the morning and at bedtime.     . prochlorperazine (COMPAZINE) 10 MG tablet Take 1 tablet (10 mg total) by mouth every 6 (six) hours as needed. 30 tablet 2  . tamsulosin (FLOMAX) 0.4 MG CAPS capsule Take 0.4 mg by mouth daily after supper.     . Tiotropium Bromide-Olodaterol (STIOLTO RESPIMAT) 2.5-2.5 MCG/ACT AERS Inhale 2 puffs into the lungs daily. 1 Inhaler 11  . traMADol (ULTRAM) 50 MG tablet Take 1 tablet (50 mg total) by mouth every 6 (six) hours as needed (mild pain). 30 tablet 0   No current facility-administered medications for this visit.    SURGICAL HISTORY:  Past Surgical History:  Procedure Laterality Date  . ABDOMINAL AORTAGRAM  11/01/2014   Procedure: Abdominal Aortagram;  Surgeon: Serafina Mitchell, MD;  Location: [facility];  Service: Cardiovascular;;  . AORTA - BILATERAL FEMORAL ARTERY BYPASS GRAFT N/A 12/28/2014   Procedure: AORTOBIFEMORAL BYPASS GRAFT;  Surgeon: Serafina Mitchell, MD;  Location: [place];  Service: Vascular;  Laterality: N/A;  . BRONCHIAL BRUSHINGS  09/06/2019   Procedure: BRONCHIAL BRUSHINGS;  Surgeon: Collene Gobble, MD;  [facility] END[facility];  Service: Pulmonary;;  left upper lobe nodule  . BRONCHIAL WASHINGS  09/06/2019   Procedure: BRONCHIAL WASHINGS;  Surgeon: Collene Gobble, MD;  Location: [facility] ENDOSCOPY;  Service: Pulmonary;;  . COLONOSCOPY W/ POLYPECTOMY  09-13-2013  . CYSTOLITHALOPAXY OF BLADDER STONE/ TRANSRECTAL ULTRASOUND PROSTATE BX  08-19-2005  . CYSTOSCOPY W/ RETROGRADES Right  12/02/2012   Procedure: CYSTOSCOPY WITH RETROGRADE PYELOGRAM;  Surgeon: Malka So, MD;  Location: [facility];  Service: Urology;  Laterality: Right;  . CYSTOSCOPY W/ RETROGRADES Bilateral 10/13/2013   Procedure: CYSTOSCOPY WITH RETROGRADE PYELOGRAM;  Surgeon: Irine Seal, MD;  Location: [facility];  Service: Urology;  Laterality: Bilateral;  . CYSTOSCOPY W/ URETERAL STENT PLACEMENT Left 04/11/2014   Procedure: CYSTOSCOPY WITH STENT REPLACEMENT;  Surgeon: Malka So, MD;  Location: [facility];  Service: Urology;  Laterality: Left;  . CYSTOSCOPY WITH LITHOLAPAXY Right 12/02/2012   Procedure: CYSTOSCOPY WITH stone extraction;  Surgeon: Malka So, MD;  [facility];  Service: Urology;  Laterality: Right;  . CYSTOSCOPY WITH LITHOLAPAXY Right 10/13/2013   Procedure: CYSTOSCOPY WITH LITHOLAPAXY,  removal of urethral stone with basket;  Surgeon: Irine Seal, MD;  Location: [facility];  Service: Urology;  Laterality: Right;  . CYSTOSCOPY WITH RETROGRADE PYELOGRAM, URETEROSCOPY AND STENT PLACEMENT Left 03/21/2014   Procedure: CYSTOSCOPY WITH RETROGRADE PYELOGRAM, AND LEFT STENT PLACEMENT;  Surgeon: Alexis Frock, MD;  Location: [facility];  Service: Urology;  Laterality: Left;  . CYSTOSCOPY WITH RETROGRADE PYELOGRAM, URETEROSCOPY AND STENT PLACEMENT  09/09/2016   Procedure: CYSTOSCOPY WITH RIGHT  RETROGRADE PYELOGRAM, URETEROSCOPY WITH LASER BASKET EXTRACTION OF STONES  AND STENT PLACEMENT;  Surgeon: Irine Seal, MD;  Location: [facility];  Service: Urology;;  . Consuela Mimes WITH URETEROSCOPY Left 04/11/2014   Procedure: CYSTOSCOPY WITH URETEROSCOPY/ STONE EXTRACTION;  Surgeon: Malka So, MD;  Location: [facility];  Service: Urology;  Laterality: Left;  . FINE NEEDLE ASPIRATION  09/06/2019   Procedure: FINE NEEDLE ASPIRATION;  Surgeon: Collene Gobble, MD;  Location: [facility] ENDOSCOPY;  Service:  Pulmonary;;  left upper lobe - lung  . HOLMIUM LASER APPLICATION Right 12/11/3084   Procedure: HOLMIUM LASER APPLICATION;  Surgeon: Malka So, MD;  Location: [facility];  Service: Urology;  Laterality: Right;  . HOLMIUM LASER APPLICATION Left 57/84/6962   Procedure: HOLMIUM LASER APPLICATION;  Surgeon: Malka So, MD;  Location: [facility];  Service: Urology;  Laterality: Left;  . HOLMIUM LASER APPLICATION Right 9/52/8413   Procedure: HOLMIUM LASER APPLICATION;  Surgeon: Irine Seal, MD;  Location: [facility];  Service: Urology;  Laterality: Right;  . INGUINAL HERNIA REPAIR Right 1954  . INTERCOSTAL NERVE BLOCK Left 10/28/2019   Procedure: Intercostal Nerve Block;  Surgeon: [name], MD;  Location: [facility];  Service: Thoracic;  Laterality: Left;  . LITHOTRIPSY  2007 & 02/2013  . LUNG BIOPSY  09/06/2019   Procedure: LUNG BIOPSY;  Surgeon: Collene Gobble, MD;  Location: [facility] ENDOSCOPY;  Service: Pulmonary;;  left upper lob lung  . [place]  . MICROLARYNGOSCOPY Right 02/18/2016   Procedure: MICRO DIRECT LARYNGOSCOPY EXCISIONAL BIOPSY OF RIGHT VOCAL CORD MASS;  Surgeon: Leta Baptist, MD;  Location: [place];  Service: ENT;  Laterality: Right;  . [place]   left ear  . MULTIPLE TOOTH EXTRACTIONS  1994  . NEPHROLITHOTOMY Left 01/27/2013   Procedure: NEPHROLITHOTOMY PERCUTANEOUS FIRST LOOK;  Surgeon: Malka So, MD;  Location: [facility];  Service: Urology;  Laterality: Left;  . NODE DISSECTION  10/28/2019   Procedure: Node Dissection;  Surgeon: [name], MD;  Location: [facility] OR;  Service: Thoracic;;  . PERIPHERAL VASCULAR CATHETERIZATION N/A 11/01/2014   Procedure: Lower Extremity Angiography;  Surgeon: Serafina Mitchell, MD;  Location: [place] [facility];  Service: Cardiovascular;  Laterality: N/A;  . URETEROSCOPY Right 12/02/2012   Procedure: RIGHT URETEROSCOPY STONE EXTRACTION WITH  STENT PLACEMENT;   Surgeon: Malka So, MD;  Location: [facility];  Service: Urology;  Laterality: Right;  Marland Kitchen VIDEO BRONCHOSCOPY  10/28/2019   VIDEO BRONCHOSCOPY WITH ENDOBRONCHIAL NAVIGATION WITH LUNG MARKINGS (N/A  . VIDEO BRONCHOSCOPY WITH ENDOBRONCHIAL NAVIGATION N/A 09/06/2019   Procedure: VIDEO BRONCHOSCOPY WITH ENDOBRONCHIAL NAVIGATION;  Surgeon: Collene Gobble, MD;  Location: [place] [facility];  Service: Pulmonary;  Laterality: N/A;  . VIDEO BRONCHOSCOPY WITH ENDOBRONCHIAL NAVIGATION N/A 10/28/2019   Procedure: VIDEO BRONCHOSCOPY WITH ENDOBRONCHIAL NAVIGATION WITH LUNG MARKINGS;  Surgeon: [name], MD;  Location: [place];  Service: Thoracic;  Laterality: N/A;  .  WEDGE RESECTION  10/28/2019   LEFT UPPER LOBE    REVIEW OF SYSTEMS:  Constitutional: positive for anorexia, fatigue and weight loss Eyes: negative Ears, nose, mouth, throat, and face: negative Respiratory: negative Cardiovascular: negative Gastrointestinal: negative Genitourinary:negative Integument/breast: negative Hematologic/lymphatic: negative Musculoskeletal:negative Neurological: negative Behavioral/Psych: negative Endocrine: negative Allergic/Immunologic: negative   PHYSICAL EXAMINATION: General appearance: alert, cooperative, fatigued and no distress Head: Normocephalic, without obvious abnormality, atraumatic Neck: no adenopathy, no JVD, supple, symmetrical, trachea midline and thyroid not enlarged, symmetric, no tenderness/mass/nodules Lymph nodes: Cervical, supraclavicular, and axillary nodes normal. Resp: clear to auscultation bilaterally Back: symmetric, no curvature. ROM normal. No CVA tenderness. Cardio: regular rate and rhythm, S1, S2 normal, no murmur, click, rub or gallop GI: soft, non-tender; bowel sounds normal; no masses,  no organomegaly Extremities: extremities normal, atraumatic, no cyanosis or edema Neurologic: Alert and oriented X 3, normal strength and tone. Normal symmetric reflexes.  Normal coordination and gait  ECOG PERFORMANCE STATUS: 1 - Symptomatic but completely ambulatory  Blood pressure 95/68, pulse (!) 103, temperature 97.9 F (36.6 C), temperature source Oral, resp. rate 14, weight 156 lb 12.8 oz (71.1 kg), SpO2 99 %.  LABORATORY DATA: Lab Results  Component Value Date   WBC 5.4 01/09/2020   HGB 13.4 01/09/2020   HCT 39.4 01/09/2020   MCV 91.4 01/09/2020   PLT 350 01/09/2020      Chemistry      Component Value Date/Time   NA 136 01/02/2020 0459   K 4.0 01/02/2020 0459   CL 106 01/02/2020 0459   CO2 23 01/02/2020 0459   BUN 12 01/02/2020 0459   CREATININE 0.74 01/02/2020 0459   CREATININE 0.87 12/19/2019 0801      Component Value Date/Time   CALCIUM 8.5 (L) 01/02/2020 0459   ALKPHOS 54 01/02/2020 0459   AST 18 01/02/2020 0459   AST 14 (L) 12/19/2019 0801   ALT 19 01/02/2020 0459   ALT 20 12/19/2019 0801   BILITOT 0.7 01/02/2020 0459   BILITOT 0.6 12/19/2019 0801       RADIOGRAPHIC STUDIES: DG Chest 2 View  Result Date: 12/26/2019 CLINICAL DATA:  Congestion and cough this week. EXAM: CHEST - 2 VIEW COMPARISON:  December 08, 2019 FINDINGS: The heart size and mediastinal contours are within normal limits. Both lungs are clear. The visualized skeletal structures are unremarkable. IMPRESSION: No active cardiopulmonary disease. Electronically Signed   By: Abelardo Diesel M.D.   On: 12/26/2019 11:04   MR Brain W Wo Contrast  Result Date: 12/16/2019 CLINICAL DATA:  Lung cancer staging. EXAM: MRI HEAD WITHOUT AND WITH CONTRAST TECHNIQUE: Multiplanar, multiecho pulse sequences of the brain and surrounding structures were obtained without and with intravenous contrast. CONTRAST:  74m MULTIHANCE GADOBENATE DIMEGLUMINE 529 MG/ML IV SOLN COMPARISON:  None. FINDINGS: Brain: No acute infarct, mass, midline shift, or extra-axial fluid collection is identified. T2 hyperintensities in the cerebral white matter and pons are nonspecific but compatible with  mild-to-moderate chronic small vessel ischemic disease. Chronic lacunar infarcts are noted in the right corona radiata, basal ganglia, and left thalamus. There is a chronic microhemorrhage in the left cerebellar hemisphere. No abnormal enhancement is identified. There is mild cerebral atrophy. Vascular: Major intracranial vascular flow voids are preserved. Skull and upper cervical spine: Unremarkable bone marrow signal. Sinuses/Orbits: Unremarkable orbits. Moderate right sphenoid and mild bilateral maxillary sinus mucosal thickening. Small volume secretions in the left maxillary sinus. Clear mastoid air cells. Other: None. IMPRESSION: 1. No evidence of intracranial metastases. 2. Mild-to-moderate chronic small  vessel ischemic disease. Electronically Signed   By: Logan Bores M.D.   On: 12/16/2019 08:47   CT ABDOMEN PELVIS W CONTRAST  Result Date: 12/26/2019 CLINICAL DATA:  Acute abdomen pain.  History of lung cancer. EXAM: CT ABDOMEN AND PELVIS WITH CONTRAST TECHNIQUE: Multidetector CT imaging of the abdomen and pelvis was performed using the standard protocol following bolus administration of intravenous contrast. CONTRAST:  34m OMNIPAQUE IOHEXOL 300 MG/ML  SOLN COMPARISON:  April 05, 2019 FINDINGS: Lower chest: Mild dependent atelectasis of right lung base is identified. The heart size is normal. Hepatobiliary: Stable enhancing low-density lesion is identified in the posterior segment right lobe liver unchanged compared prior CT. The liver is otherwise. Gallstones in gallbladder. No inflammation is noted surrounding the gallbladder. The biliary tree is normal. Pancreas: Unremarkable. No pancreatic ductal dilatation or surrounding inflammatory changes. Spleen: Normal in size without focal abnormality. Adrenals/Urinary Tract: 1 cm nodule is identified in the left adrenal gland unchanged. The right adrenal gland is normal. There is atrophy of the right kidney unchanged. There left kidney cyst. There is no  hydronephrosis bilaterally. Foley catheter is identified in a decompressed bladder. Stomach/Bowel: There are multiple thick wall mild dilated small bowel loops in the mid and lower abdomen probably as a result of partial bowel obstruction due to herniated small bowel loops in the midline 10 cm anterior abdominal hernia. Colon is normal. The appendix is not seen but no inflammation is noted around cecum. Vascular/Lymphatic: Aortic atherosclerosis. No enlarged abdominal or pelvic lymph nodes. Reproductive: Enlarged prostate with calcifications are identified. Other: Midline anterior abdominal hernia containing small bowel are identified as previously described. Subcutaneous air is identified in the anterior right abdominal and pelvic subcutaneous fat, source indeterminate. Clinical correlation regarding recent injection in the area is suggested. There is no free air in the abdomen and pelvis. Musculoskeletal: Degenerative joint changes of the spine are noted. IMPRESSION: 1. Multiple thick wall mild dilated small bowel loops in the mid and lower abdomen probably as a result of partial bowel obstruction due to herniated small bowel loops in the midline anterior abdominal hernia. 2. Subcutaneous air is identified in the anterior right abdominal and pelvic subcutaneous fat, source indeterminate. Clinical correlation regarding recent injection in the area is suggested. There is no free air in the abdomen and pelvis. 3. Aortic atherosclerosis. These results were called by telephone at the time of interpretation on 12/26/2019 at 12:57 pm [facility] who verbally acknowledged these results. Aortic Atherosclerosis (ICD10-I70.0). Electronically Signed   By: WAbelardo DieselM.D.   On: 12/26/2019 13:01   DG CHEST PORT 1 VIEW  Result Date: 01/01/2020 CLINICAL DATA:  74year old male with nonfunctioning right PICC line. Query positioning. EXAM: PORTABLE CHEST 1 VIEW COMPARISON:  12/26/2019 chest radiographs and  earlier. FINDINGS: Portable AP semi upright view at 2331 hours. New right side PICC line. The tip is at the level of the confluence of the right innominate vein and SVC, just above the level of the aortic arch. The line might be twisted or kinked near the skin site at the right arm (arrow). Stable lung volumes and mediastinal contours with tortuous, calcified thoracic aorta. Visualized tracheal air column is within normal limits. No pneumothorax, pulmonary edema or pleural effusion. Mildly increased right lung base patchy opacity. Stable ventilation elsewhere. IMPRESSION: 1. Right side PICC line tip at the confluence of the right innominate vein and SVC. Note that the line might be twisted or kinked near the skin site at  the right arm. 2. Increased right lung base patchy opacity since 12/26/2019. Consider atelectasis or infection. Electronically Signed   By: Genevie Ann M.D.   On: 01/01/2020 23:44   [place] EKG SITE RITE  Result Date: 12/27/2019 If Site Rite image not attached, placement could not be confirmed due to current cardiac rhythm.   ASSESSMENT AND PLAN: This is a very pleasant 74 years old white male recently diagnosed with stage IIIa (T2 a, N2, M0) non-small cell lung cancer, adenocarcinoma presented with left upper lobe lung nodule and after resection the patient was found to have metastatic disease to the mediastinal lymphadenopathy.  This was diagnosed in March 2021.   He is currently undergoing adjuvant systemic chemotherapy with cisplatin, pemetrexed and pembrolizumab status post 1 cycle. His treatment was complicated with significant neutropenia as well as electrolyte imbalance including hypomagnesemia and hypokalemia. The patient received IV hydration as well as electrolyte supplements during his hospitalization.  He also received Granix for the hyponatremia. He is feeling much better today except for the fatigue and weight loss. I recommended for the patient to proceed with cycle #2 of his  treatment today but I will reduce the dose of cisplatin to 56 mg/M2 and pemetrexed to 375 mg/M2 every 3 weeks in addition to his dose of pembrolizumab every 6 weeks. The patient will come back for follow-up visit in 3 weeks for evaluation before starting cycle #3. For the lack of appetite and weight loss, I strongly encouraged the patient to increase his oral intake and nutrition supplements. He was advised to call immediately if he has any other concerning symptoms in the interval. The patient voices understanding of current disease status and treatment options and is in agreement with the current care plan. All questions were answered. The patient knows to call the clinic with any problems, questions or concerns. We can certainly see the patient much sooner if necessary.  The total time spent in the appointment was 30 minutes.  Disclaimer: This note was dictated with voice recognition software. Similar sounding words can inadvertently be transcribed and may not be corrected upon review.

## 2020-01-09 NOTE — Progress Notes (Signed)
Met w/ pt to introduce myself as his Arboriculturist.  Unfortunately there aren't any foundations offering copay assistance for his Dx and the type of ins he has.  I offered the [employer], went over what it covers, gave him the income requirement and an expense sheet.  He would like to think about it.  He has my card if he decides to apply for the grant.

## 2020-01-09 NOTE — Patient Instructions (Signed)
Steps to Quit Smoking Smoking tobacco is the leading cause of preventable death. It can affect almost every organ in the body. Smoking puts you and people around you at risk for many serious, long-lasting (chronic) diseases. Quitting smoking can be hard, but it is one of the best things that you can do for your health. It is never too late to quit. How do I get ready to quit? When you decide to quit smoking, make a plan to help you succeed. Before you quit:  Pick a date to quit. Set a date within the next 2 weeks to give you time to prepare.  Write down the reasons why you are quitting. Keep this list in places where you will see it often.  Tell your family, friends, and co-workers that you are quitting. Their support is important.  Talk with your doctor about the choices that may help you quit.  Find out if your health insurance will pay for these treatments.  Know the people, places, things, and activities that make you want to smoke (triggers). Avoid them. What first steps can I take to quit smoking?  Throw away all cigarettes at home, at work, and in your car.  Throw away the things that you use when you smoke, such as ashtrays and lighters.  Clean your car. Make sure to empty the ashtray.  Clean your home, including curtains and carpets. What can I do to help me quit smoking? Talk with your doctor about taking medicines and seeing a counselor at the same time. You are more likely to succeed when you do both.  If you are pregnant or breastfeeding, talk with your doctor about counseling or other ways to quit smoking. Do not take medicine to help you quit smoking unless your doctor tells you to do so. To quit smoking: Quit right away  Quit smoking totally, instead of slowly cutting back on how much you smoke over a period of time.  Go to counseling. You are more likely to quit if you go to counseling sessions regularly. Take medicine You may take medicines to help you quit. Some  medicines need a prescription, and some you can buy over-the-counter. Some medicines may contain a drug called nicotine to replace the nicotine in cigarettes. Medicines may:  Help you to stop having the desire to smoke (cravings).  Help to stop the problems that come when you stop smoking (withdrawal symptoms). Your doctor may ask you to use:  Nicotine patches, gum, or lozenges.  Nicotine inhalers or sprays.  Non-nicotine medicine that is taken by mouth. Find resources Find resources and other ways to help you quit smoking and remain smoke-free after you quit. These resources are most helpful when you use them often. They include:  Online chats with a counselor.  Phone quitlines.  Printed self-help materials.  Support groups or group counseling.  Text messaging programs.  Mobile phone apps. Use apps on your mobile phone or tablet that can help you stick to your quit plan. There are many free apps for mobile phones and tablets as well as websites. Examples include Quit Guide from the CDC and smokefree.gov  What things can I do to make it easier to quit?   Talk to your family and friends. Ask them to support and encourage you.  Call a phone quitline (1-800-QUIT-NOW), reach out to support groups, or work with a counselor.  Ask people who smoke to not smoke around you.  Avoid places that make you want to smoke,   such as: ? Bars. ? Parties. ? Smoke-break areas at work.  Spend time with people who do not smoke.  Lower the stress in your life. Stress can make you want to smoke. Try these things to help your stress: ? Getting regular exercise. ? Doing deep-breathing exercises. ? Doing yoga. ? Meditating. ? Doing a body scan. To do this, close your eyes, focus on one area of your body at a time from head to toe. Notice which parts of your body are tense. Try to relax the muscles in those areas. How will I feel when I quit smoking? Day 1 to 3 weeks Within the first 24 hours,  you may start to have some problems that come from quitting tobacco. These problems are very bad 2-3 days after you quit, but they do not often last for more than 2-3 weeks. You may get these symptoms:  Mood swings.  Feeling restless, nervous, angry, or annoyed.  Trouble concentrating.  Dizziness.  Strong desire for high-sugar foods and nicotine.  Weight gain.  Trouble pooping (constipation).  Feeling like you may vomit (nausea).  Coughing or a sore throat.  Changes in how the medicines that you take for other issues work in your body.  Depression.  Trouble sleeping (insomnia). Week 3 and afterward After the first 2-3 weeks of quitting, you may start to notice more positive results, such as:  Better sense of smell and taste.  Less coughing and sore throat.  Slower heart rate.  Lower blood pressure.  Clearer skin.  Better breathing.  Fewer sick days. Quitting smoking can be hard. Do not give up if you fail the first time. Some people need to try a few times before they succeed. Do your best to stick to your quit plan, and talk with your doctor if you have any questions or concerns. Summary  Smoking tobacco is the leading cause of preventable death. Quitting smoking can be hard, but it is one of the best things that you can do for your health.  When you decide to quit smoking, make a plan to help you succeed.  Quit smoking right away, not slowly over a period of time.  When you start quitting, seek help from your doctor, family, or friends. This information is not intended to replace advice given to you by your health care provider. Make sure you discuss any questions you have with your health care provider. Document Revised: 03/04/2019 Document Reviewed: 08/28/2018 Elsevier Patient Education  2020 [employer]  

## 2020-01-09 NOTE — Patient Instructions (Signed)
[facility] Discharge Instructions for Patients Receiving Chemotherapy  Today you received the following chemotherapy agents:pemetrexid, cisplatin.   To help prevent nausea and vomiting after your treatment, we encourage you to take your nausea medication as prescribed by your physician.    If you develop nausea and vomiting that is not controlled by your nausea medication, call the clinic.   BELOW ARE SYMPTOMS THAT SHOULD BE REPORTED IMMEDIATELY:  *FEVER GREATER THAN 100.5 F  *CHILLS WITH OR WITHOUT FEVER  NAUSEA AND VOMITING THAT IS NOT CONTROLLED WITH YOUR NAUSEA MEDICATION  *UNUSUAL SHORTNESS OF BREATH  *UNUSUAL BRUISING OR BLEEDING  TENDERNESS IN MOUTH AND THROAT WITH OR WITHOUT PRESENCE OF ULCERS  *URINARY PROBLEMS  *BOWEL PROBLEMS  UNUSUAL RASH Items with * indicate a potential emergency and should be followed up as soon as possible.  Feel free to call the clinic should you have any questions or concerns. The clinic phone number is [phone].  Please show the [facility] at check-in to the Emergency Department and triage nurse.

## 2020-01-09 NOTE — Progress Notes (Signed)
Per Dr. Worthy Flank orders, patient to receive an additional 2 grams of magnesium today with treatment. This has been added to patient's cisplatin fluids for ease of administration. Total magnesium in fluids = 4 grams.   Demetrius Charity, PharmD, BCPS, [facility] Pharmacist Pharmacy Phone: [phone] 01/09/2020

## 2020-01-09 NOTE — Research (Signed)
01/09/2020  Research - [facility] [id number] research study - Cycle 2, Day 1 visit  Patient into clinic today accompanied by his wife for Cycle 2, Day 1 assessments and treatment with cisplatin and pemetrexed (pembrolizumab administered every 6 weeks and not due at today's treatment). Due to recent hospitalization due to toxicity, treatment will be modified per protocol for grade 3 non-hematologic adverse events.   Routine labs - C1D1 CBC and CMET obtained as well as standard of care magnesium level. Per MD discretion, phosphorus level also obtained due to abnormalities during inpatient stay.   Solicited AEs - Patient denies any unusual shortness of breath, rash or diarrhea at present. No change in reports of urinary retention.   H&P and Performance status - See MD progress note. Based on investigator's lab results review and exam today, patient condition is acceptable for treatment.    Infusion - Sign for infusion given to Laurence Compton RN, Charge Nurse, along with patient status update.   Protocol Update #04 - Per instruction in the 12/23/2019 Physician letter, patient was verbally notified by Dr. Julien Nordmann of the [id number] study results noting no change in the current adjuvant treatment for lung cancer. As such, the [facility] [id number] study is continuing without change. Patient understands the information and will continue on treatment. He is aware that a copy of the Patient letter will be mailed to him upon activation at the site.  Cindy S. Brigitte Pulse BSN, RN, CCRP 01/09/2020 9:30 AM  01/09/2020 Infusion - Patient status update including hospitalization following Cycle 1 treatment, nutritional status, lab abnormalities and treatment modifications were reviewed with Criselda Peaches, RN. Magnesium abnormality managed by Dr. Julien Nordmann. Cindy S. Brigitte Pulse BSN, RN, CCRP 01/09/2020 10:33 AM

## 2020-01-10 ENCOUNTER — Other Ambulatory Visit: Payer: Self-pay | Admitting: *Deleted

## 2020-01-10 ENCOUNTER — Telehealth: Payer: Self-pay | Admitting: Medical Oncology

## 2020-01-10 DIAGNOSIS — R338 Other retention of urine: Secondary | ICD-10-CM | POA: Diagnosis not present

## 2020-01-10 DIAGNOSIS — C3492 Malignant neoplasm of unspecified part of left bronchus or lung: Secondary | ICD-10-CM

## 2020-01-10 NOTE — Telephone Encounter (Signed)
COVID exposure-Wife and pt exposed to [place] by daughter who lives with them.   They  want to know if he should take vaccine.  I instructed wife to get tested first and gave them the number to call .  If COVID negative then they should get the vaccine per Dr Julien Nordmann.

## 2020-01-11 ENCOUNTER — Ambulatory Visit: Payer: Medicare Other | Attending: Internal Medicine

## 2020-01-11 DIAGNOSIS — Z20822 Contact with and (suspected) exposure to covid-19: Secondary | ICD-10-CM | POA: Diagnosis not present

## 2020-01-12 LAB — NOVEL CORONAVIRUS, NAA: SARS-CoV-2, NAA: NOT DETECTED

## 2020-01-12 LAB — SARS-COV-2, NAA 2 DAY TAT

## 2020-01-12 NOTE — Research (Signed)
01/12/2020  Adverse Event Log (Baseline AEs) Protocol: [facility] [id number] Cycle 1: 12/19/2019 - 01/09/2020 (End of cycle = 4/80/1655)  Solicited &/or Reportable Events Grade Attribution to study treatment Comments  Anemia Grade 1 Possible    Hypothyroidism Grade 0 -  Assessed clinically  Pneumonitis  Grade 0 -    Neutrophil count decreased Grade 4 Probable    Platelet count decreased Grade 2 Probable    Rash maculo-papular Grade 0 -    Hyperglycemia Grade 3 Possible Insulin therapy during IP stay.  ALT increased Grade 0 -    AST increased Grade 0 -    Blood bilirubin increased Grade 1 Possible    Diarrhea Grade 1 Probable At times, increased frequency, up to 2-3 x/day of normal to loose stools  Partial small bowel obstruction Grade 3 Unrelated   Small bowel enteritis Grade 3 Possible Chemotherapy reduced to Dose level -1  Sepsis/SIRS Grade 3 Possible Chemotherapy reduced to Dose level -1  Leukopenia Grade 4 Probable   Lymphopenia Grade 3 Probable   Hypokalemia Grade 4 Unlikely   Hypophosphatemia Grade 3 Unlikely   Malnutrition Grade 3 Possible Prealbumin 9.8 mg/dL (LNR 18-38)  Elevated creatinine Grade 2 Possible   Hypoalbuminemia Grade 2 Possible   Urinary retention Grade 2 Baseline/Unrelated Indwelling catheter per urology   Non-reportable AEs (Other than Solicited, Grade = 1): - Hypomagnesemia - Hypercalcemia - Weight loss - Tachycardia  Cindy S. Brigitte Pulse BSN, RN, [place] 01/12/2020 11:57 AM

## 2020-01-13 ENCOUNTER — Telehealth: Payer: Self-pay | Admitting: *Deleted

## 2020-01-13 ENCOUNTER — Other Ambulatory Visit: Payer: Self-pay | Admitting: *Deleted

## 2020-01-13 DIAGNOSIS — C3492 Malignant neoplasm of unspecified part of left bronchus or lung: Secondary | ICD-10-CM

## 2020-01-13 NOTE — Telephone Encounter (Signed)
Wife states he had a very small BM with a fair amount of gas. Will take 2 more senakot today.

## 2020-01-13 NOTE — Telephone Encounter (Signed)
Wife states Chad Gomez has not had a BM in 1 week. Has not taken anything for bowels. Instructed to get miralax and senakot from pharmacy- start taking this morning according to directions, drink plenty of water and eat fruits. Requested they call this afternoon to update [place]. Verbalized understanding

## 2020-01-15 ENCOUNTER — Other Ambulatory Visit: Payer: Self-pay

## 2020-01-15 ENCOUNTER — Inpatient Hospital Stay (HOSPITAL_COMMUNITY): Payer: Medicare Other

## 2020-01-15 ENCOUNTER — Emergency Department (HOSPITAL_COMMUNITY): Payer: Medicare Other

## 2020-01-15 ENCOUNTER — Inpatient Hospital Stay (HOSPITAL_COMMUNITY)
Admission: EM | Admit: 2020-01-15 | Discharge: 2020-01-20 | DRG: 698 | Disposition: A | Discharge status: Home / Self Care | Payer: Medicare Other | Attending: Internal Medicine | Admitting: Internal Medicine

## 2020-01-15 ENCOUNTER — Encounter (HOSPITAL_COMMUNITY): Payer: Self-pay | Admitting: Obstetrics and Gynecology

## 2020-01-15 DIAGNOSIS — E785 Hyperlipidemia, unspecified: Secondary | ICD-10-CM | POA: Diagnosis not present

## 2020-01-15 DIAGNOSIS — N390 Urinary tract infection, site not specified: Secondary | ICD-10-CM | POA: Diagnosis present

## 2020-01-15 DIAGNOSIS — Z823 Family history of stroke: Secondary | ICD-10-CM

## 2020-01-15 DIAGNOSIS — E872 Acidosis, unspecified: Secondary | ICD-10-CM | POA: Diagnosis present

## 2020-01-15 DIAGNOSIS — D6959 Other secondary thrombocytopenia: Secondary | ICD-10-CM | POA: Diagnosis present

## 2020-01-15 DIAGNOSIS — E43 Unspecified severe protein-calorie malnutrition: Secondary | ICD-10-CM | POA: Diagnosis not present

## 2020-01-15 DIAGNOSIS — E861 Hypovolemia: Secondary | ICD-10-CM | POA: Diagnosis not present

## 2020-01-15 DIAGNOSIS — R197 Diarrhea, unspecified: Secondary | ICD-10-CM | POA: Diagnosis not present

## 2020-01-15 DIAGNOSIS — C3412 Malignant neoplasm of upper lobe, left bronchus or lung: Secondary | ICD-10-CM | POA: Diagnosis not present

## 2020-01-15 DIAGNOSIS — E118 Type 2 diabetes mellitus with unspecified complications: Secondary | ICD-10-CM | POA: Diagnosis not present

## 2020-01-15 DIAGNOSIS — E876 Hypokalemia: Secondary | ICD-10-CM | POA: Diagnosis present

## 2020-01-15 DIAGNOSIS — J449 Chronic obstructive pulmonary disease, unspecified: Secondary | ICD-10-CM

## 2020-01-15 DIAGNOSIS — Z7984 Long term (current) use of oral hypoglycemic drugs: Secondary | ICD-10-CM

## 2020-01-15 DIAGNOSIS — K8020 Calculus of gallbladder without cholecystitis without obstruction: Secondary | ICD-10-CM | POA: Diagnosis not present

## 2020-01-15 DIAGNOSIS — I10 Essential (primary) hypertension: Secondary | ICD-10-CM | POA: Diagnosis not present

## 2020-01-15 DIAGNOSIS — R3912 Poor urinary stream: Secondary | ICD-10-CM | POA: Diagnosis present

## 2020-01-15 DIAGNOSIS — Z85118 Personal history of other malignant neoplasm of bronchus and lung: Secondary | ICD-10-CM

## 2020-01-15 DIAGNOSIS — F17210 Nicotine dependence, cigarettes, uncomplicated: Secondary | ICD-10-CM | POA: Diagnosis present

## 2020-01-15 DIAGNOSIS — C3410 Malignant neoplasm of upper lobe, unspecified bronchus or lung: Secondary | ICD-10-CM | POA: Diagnosis not present

## 2020-01-15 DIAGNOSIS — Z6822 Body mass index (BMI) 22.0-22.9, adult: Secondary | ICD-10-CM

## 2020-01-15 DIAGNOSIS — C3490 Malignant neoplasm of unspecified part of unspecified bronchus or lung: Secondary | ICD-10-CM

## 2020-01-15 DIAGNOSIS — Z978 Presence of other specified devices: Secondary | ICD-10-CM

## 2020-01-15 DIAGNOSIS — R6520 Severe sepsis without septic shock: Secondary | ICD-10-CM | POA: Diagnosis not present

## 2020-01-15 DIAGNOSIS — Z20822 Contact with and (suspected) exposure to covid-19: Secondary | ICD-10-CM | POA: Diagnosis present

## 2020-01-15 DIAGNOSIS — E1151 Type 2 diabetes mellitus with diabetic peripheral angiopathy without gangrene: Secondary | ICD-10-CM | POA: Diagnosis present

## 2020-01-15 DIAGNOSIS — E860 Dehydration: Secondary | ICD-10-CM | POA: Diagnosis present

## 2020-01-15 DIAGNOSIS — Z87442 Personal history of urinary calculi: Secondary | ICD-10-CM

## 2020-01-15 DIAGNOSIS — N179 Acute kidney failure, unspecified: Secondary | ICD-10-CM | POA: Diagnosis not present

## 2020-01-15 DIAGNOSIS — T451X5A Adverse effect of antineoplastic and immunosuppressive drugs, initial encounter: Secondary | ICD-10-CM | POA: Diagnosis not present

## 2020-01-15 DIAGNOSIS — Z79899 Other long term (current) drug therapy: Secondary | ICD-10-CM

## 2020-01-15 DIAGNOSIS — R5081 Fever presenting with conditions classified elsewhere: Secondary | ICD-10-CM | POA: Diagnosis not present

## 2020-01-15 DIAGNOSIS — J432 Centrilobular emphysema: Secondary | ICD-10-CM | POA: Diagnosis present

## 2020-01-15 DIAGNOSIS — D649 Anemia, unspecified: Secondary | ICD-10-CM | POA: Diagnosis present

## 2020-01-15 DIAGNOSIS — T83511A Infection and inflammatory reaction due to indwelling urethral catheter, initial encounter: Principal | ICD-10-CM | POA: Diagnosis present

## 2020-01-15 DIAGNOSIS — Z902 Acquired absence of lung [part of]: Secondary | ICD-10-CM | POA: Diagnosis not present

## 2020-01-15 DIAGNOSIS — Z85820 Personal history of malignant melanoma of skin: Secondary | ICD-10-CM

## 2020-01-15 DIAGNOSIS — I700 Atherosclerosis of aorta: Secondary | ICD-10-CM | POA: Diagnosis not present

## 2020-01-15 DIAGNOSIS — N401 Enlarged prostate with lower urinary tract symptoms: Secondary | ICD-10-CM | POA: Diagnosis present

## 2020-01-15 DIAGNOSIS — D701 Agranulocytosis secondary to cancer chemotherapy: Secondary | ICD-10-CM | POA: Diagnosis present

## 2020-01-15 DIAGNOSIS — F17200 Nicotine dependence, unspecified, uncomplicated: Secondary | ICD-10-CM | POA: Diagnosis not present

## 2020-01-15 DIAGNOSIS — Z7982 Long term (current) use of aspirin: Secondary | ICD-10-CM

## 2020-01-15 DIAGNOSIS — Z8249 Family history of ischemic heart disease and other diseases of the circulatory system: Secondary | ICD-10-CM

## 2020-01-15 DIAGNOSIS — K439 Ventral hernia without obstruction or gangrene: Secondary | ICD-10-CM | POA: Diagnosis present

## 2020-01-15 DIAGNOSIS — A419 Sepsis, unspecified organism: Secondary | ICD-10-CM | POA: Diagnosis present

## 2020-01-15 DIAGNOSIS — Z8719 Personal history of other diseases of the digestive system: Secondary | ICD-10-CM

## 2020-01-15 DIAGNOSIS — I70213 Atherosclerosis of native arteries of extremities with intermittent claudication, bilateral legs: Secondary | ICD-10-CM | POA: Diagnosis present

## 2020-01-15 DIAGNOSIS — Z95820 Peripheral vascular angioplasty status with implants and grafts: Secondary | ICD-10-CM

## 2020-01-15 LAB — URINALYSIS, ROUTINE W REFLEX MICROSCOPIC
Bilirubin Urine: NEGATIVE
Glucose, UA: NEGATIVE mg/dL
Hgb urine dipstick: NEGATIVE
Ketones, ur: 5 mg/dL — AB
Nitrite: NEGATIVE
Protein, ur: 100 mg/dL — AB
Specific Gravity, Urine: 1.021 (ref 1.005–1.030)
pH: 5 (ref 5.0–8.0)

## 2020-01-15 LAB — COMPREHENSIVE METABOLIC PANEL WITH GFR
ALT: 25 U/L (ref 0–44)
AST: 21 U/L (ref 15–41)
Albumin: 3.2 g/dL — ABNORMAL LOW (ref 3.5–5.0)
Alkaline Phosphatase: 68 U/L (ref 38–126)
Anion gap: 16 — ABNORMAL HIGH (ref 5–15)
BUN: 37 mg/dL — ABNORMAL HIGH (ref 8–23)
CO2: 28 mmol/L (ref 22–32)
Calcium: 9.9 mg/dL (ref 8.9–10.3)
Chloride: 92 mmol/L — ABNORMAL LOW (ref 98–111)
Creatinine, Ser: 1.46 mg/dL — ABNORMAL HIGH (ref 0.61–1.24)
GFR calc Af Amer: 55 mL/min — ABNORMAL LOW
GFR calc non Af Amer: 47 mL/min — ABNORMAL LOW
Glucose, Bld: 219 mg/dL — ABNORMAL HIGH (ref 70–99)
Potassium: 4.7 mmol/L (ref 3.5–5.1)
Sodium: 136 mmol/L (ref 135–145)
Total Bilirubin: 1.2 mg/dL (ref 0.3–1.2)
Total Protein: 6 g/dL — ABNORMAL LOW (ref 6.5–8.1)

## 2020-01-15 LAB — DIFFERENTIAL
Abs Immature Granulocytes: 0 10*3/uL (ref 0.00–0.07)
Basophils Absolute: 0 10*3/uL (ref 0.0–0.1)
Basophils Relative: 0 %
Eosinophils Absolute: 0 10*3/uL (ref 0.0–0.5)
Eosinophils Relative: 0 %
Immature Granulocytes: 0 %
Lymphocytes Relative: 55 %
Lymphs Abs: 0.4 10*3/uL — ABNORMAL LOW (ref 0.7–4.0)
Monocytes Absolute: 0 10*3/uL — ABNORMAL LOW (ref 0.1–1.0)
Monocytes Relative: 6 %
Neutro Abs: 0.3 10*3/uL — ABNORMAL LOW (ref 1.7–7.7)
Neutrophils Relative %: 39 %

## 2020-01-15 LAB — CBC
HCT: 44.1 % (ref 39.0–52.0)
Hemoglobin: 14.7 g/dL (ref 13.0–17.0)
MCH: 31 pg (ref 26.0–34.0)
MCHC: 33.3 g/dL (ref 30.0–36.0)
MCV: 93 fL (ref 80.0–100.0)
Platelets: 190 K/uL (ref 150–400)
RBC: 4.74 MIL/uL (ref 4.22–5.81)
RDW: 13.1 % (ref 11.5–15.5)
WBC: 0.7 K/uL — CL (ref 4.0–10.5)
nRBC: 0 % (ref 0.0–0.2)

## 2020-01-15 LAB — LACTIC ACID, PLASMA
Lactic Acid, Venous: 3.2 mmol/L (ref 0.5–1.9)
Lactic Acid, Venous: 3.4 mmol/L (ref 0.5–1.9)

## 2020-01-15 LAB — MAGNESIUM: Magnesium: 1.5 mg/dL — ABNORMAL LOW (ref 1.7–2.4)

## 2020-01-15 LAB — SARS CORONAVIRUS 2 BY RT PCR (HOSPITAL ORDER, PERFORMED IN ~~LOC~~ HOSPITAL LAB): SARS Coronavirus 2: NEGATIVE

## 2020-01-15 LAB — CBG MONITORING, ED: Glucose-Capillary: 110 mg/dL — ABNORMAL HIGH (ref 70–99)

## 2020-01-15 LAB — LIPASE, BLOOD: Lipase: 22 U/L (ref 11–51)

## 2020-01-15 MED ORDER — SODIUM CHLORIDE 0.9 % IV BOLUS
1000.0000 mL | Freq: Once | INTRAVENOUS | Status: AC
  Administered 2020-01-15: 1000 mL via INTRAVENOUS

## 2020-01-15 MED ORDER — METRONIDAZOLE IN NACL 5-0.79 MG/ML-% IV SOLN
500.0000 mg | Freq: Once | INTRAVENOUS | Status: AC
  Administered 2020-01-15: 500 mg via INTRAVENOUS
  Filled 2020-01-15: qty 100

## 2020-01-15 MED ORDER — UMECLIDINIUM BROMIDE 62.5 MCG/INH IN AEPB
1.0000 | INHALATION_SPRAY | Freq: Every day | RESPIRATORY_TRACT | Status: DC
  Administered 2020-01-16 – 2020-01-20 (×5): 1 via RESPIRATORY_TRACT
  Filled 2020-01-15: qty 7

## 2020-01-15 MED ORDER — METOPROLOL TARTRATE 5 MG/5ML IV SOLN: 5.0000 mg | Freq: Four times a day (QID) | INTRAVENOUS | Status: DC | PRN

## 2020-01-15 MED ORDER — SODIUM CHLORIDE 0.9 % IV SOLN
2.0000 g | Freq: Once | INTRAVENOUS | Status: AC
  Administered 2020-01-15: 2 g via INTRAVENOUS
  Filled 2020-01-15: qty 2

## 2020-01-15 MED ORDER — MAGNESIUM SULFATE 2 GM/50ML IV SOLN
2.0000 g | Freq: Once | INTRAVENOUS | Status: AC
  Administered 2020-01-15: 2 g via INTRAVENOUS
  Filled 2020-01-15: qty 50

## 2020-01-15 MED ORDER — ONDANSETRON HCL 4 MG PO TABS: 4.0000 mg | ORAL_TABLET | Freq: Four times a day (QID) | ORAL | Status: DC | PRN

## 2020-01-15 MED ORDER — IOHEXOL 300 MG/ML  SOLN
100.0000 mL | Freq: Once | INTRAMUSCULAR | Status: AC | PRN
  Administered 2020-01-15: 100 mL via INTRAVENOUS

## 2020-01-15 MED ORDER — SODIUM CHLORIDE 0.9 % IV SOLN
2.0000 g | Freq: Once | INTRAVENOUS | Status: DC
  Filled 2020-01-15: qty 2

## 2020-01-15 MED ORDER — INSULIN ASPART 100 UNIT/ML ~~LOC~~ SOLN
0.0000 [IU] | Freq: Three times a day (TID) | SUBCUTANEOUS | Status: DC
  Administered 2020-01-16: 1 [IU] via SUBCUTANEOUS
  Administered 2020-01-17: 2 [IU] via SUBCUTANEOUS
  Administered 2020-01-17 – 2020-01-19 (×3): 1 [IU] via SUBCUTANEOUS
  Filled 2020-01-15: qty 0.09

## 2020-01-15 MED ORDER — ENOXAPARIN SODIUM 40 MG/0.4ML ~~LOC~~ SOLN
40.0000 mg | SUBCUTANEOUS | Status: DC
  Administered 2020-01-15 – 2020-01-16 (×2): 40 mg via SUBCUTANEOUS
  Filled 2020-01-15 (×2): qty 0.4

## 2020-01-15 MED ORDER — NICOTINE 21 MG/24HR TD PT24
21.0000 mg | MEDICATED_PATCH | Freq: Every day | TRANSDERMAL | Status: DC
  Administered 2020-01-15 – 2020-01-20 (×6): 21 mg via TRANSDERMAL
  Filled 2020-01-15 (×6): qty 1

## 2020-01-15 MED ORDER — METRONIDAZOLE IN NACL 5-0.79 MG/ML-% IV SOLN
500.0000 mg | Freq: Three times a day (TID) | INTRAVENOUS | Status: DC
  Administered 2020-01-16 – 2020-01-20 (×13): 500 mg via INTRAVENOUS
  Filled 2020-01-15 (×13): qty 100

## 2020-01-15 MED ORDER — FENTANYL CITRATE (PF) 100 MCG/2ML IJ SOLN
12.5000 ug | INTRAMUSCULAR | Status: DC | PRN
  Administered 2020-01-16 – 2020-01-19 (×13): 50 ug via INTRAVENOUS
  Filled 2020-01-15 (×13): qty 2

## 2020-01-15 MED ORDER — SODIUM CHLORIDE 0.9% FLUSH
3.0000 mL | Freq: Once | INTRAVENOUS | Status: AC
  Administered 2020-01-15: 3 mL via INTRAVENOUS

## 2020-01-15 MED ORDER — ONDANSETRON HCL 4 MG/2ML IJ SOLN
4.0000 mg | Freq: Four times a day (QID) | INTRAMUSCULAR | Status: DC | PRN
  Administered 2020-01-16 – 2020-01-17 (×3): 4 mg via INTRAVENOUS
  Filled 2020-01-15 (×3): qty 2

## 2020-01-15 MED ORDER — SODIUM CHLORIDE 0.9 % IV SOLN: INTRAVENOUS | Status: AC

## 2020-01-15 MED ORDER — ALBUTEROL SULFATE (2.5 MG/3ML) 0.083% IN NEBU: 2.5000 mg | INHALATION_SOLUTION | Freq: Four times a day (QID) | RESPIRATORY_TRACT | Status: DC | PRN

## 2020-01-15 MED ORDER — ALBUTEROL SULFATE HFA 108 (90 BASE) MCG/ACT IN AERS: 2.0000 | INHALATION_SPRAY | Freq: Four times a day (QID) | RESPIRATORY_TRACT | Status: DC | PRN

## 2020-01-15 MED ORDER — SODIUM CHLORIDE 0.9 % IV SOLN
2.0000 g | Freq: Two times a day (BID) | INTRAVENOUS | Status: DC
  Administered 2020-01-16 – 2020-01-17 (×3): 2 g via INTRAVENOUS
  Filled 2020-01-15 (×3): qty 2

## 2020-01-15 MED ORDER — LACTATED RINGERS IV BOLUS (SEPSIS)
1000.0000 mL | Freq: Once | INTRAVENOUS | Status: AC
  Administered 2020-01-15: 1000 mL via INTRAVENOUS

## 2020-01-15 MED ORDER — LACTATED RINGERS IV BOLUS (SEPSIS)
250.0000 mL | Freq: Once | INTRAVENOUS | Status: AC
  Administered 2020-01-15: 250 mL via INTRAVENOUS

## 2020-01-15 MED ORDER — ARFORMOTEROL TARTRATE 15 MCG/2ML IN NEBU
15.0000 ug | INHALATION_SOLUTION | Freq: Two times a day (BID) | RESPIRATORY_TRACT | Status: DC
  Administered 2020-01-16 – 2020-01-20 (×8): 15 ug via RESPIRATORY_TRACT
  Filled 2020-01-15 (×10): qty 2

## 2020-01-15 NOTE — Progress Notes (Signed)
Pharmacy Antibiotic Note  DEANDRAE WAJDA is a 74 y.o. male admitted on 01/15/2020 with intra-abdominal infection.  Pharmacy has been consulted for Cefepime dosing.  Afebrile, Neutropenic, Scr elevated above pt's baseline   Plan: Cefepime 2gm IV q12h for est CrCl 30-82ml/min Monitor renal function and cx data      Temp (24hrs), Avg:99.2 F (37.3 C), Min:99.2 F (37.3 C), Max:99.2 F (37.3 C)  Recent Labs  Lab 01/09/20 0813 01/15/20 1704 01/15/20 1813  WBC 5.4 0.7*  --   CREATININE 0.94 1.46*  --   LATICACIDVEN  --   --  3.2*    Estimated Creatinine Clearance: 45.3 mL/min (A) (by C-G formula based on SCr of 1.46 mg/dL (H)).    Allergies  Allergen Reactions  . Morphine And Related Anxiety and Other (See Comments)    Combative, incoherent    . Tape Other (See Comments) and Rash    Surgical tape Adhesive tape causes blisters  . Amoxicillin Other (See Comments)    Severe stomach pain  . Naproxen Swelling    Eyes swell    Antimicrobials this admission: 7/25 Cefepime >>  7/25 Flagyl >>   Dose adjustments this admission:  Microbiology results: 7/25 BCx:  7/25 UCx:   7/25 COVID: negative  Thank you for allowing pharmacy to be a part of this patient's care.  Netta Cedars PharmD, BCPS 01/15/2020 9:43 PM

## 2020-01-15 NOTE — ED Notes (Signed)
Date and time results received: 01/15/20 7:34 PM  (use smartphrase ".now" to insert current time)  Test: WBC, Lactic Acid Critical Value: 0.7, 3.2  Name of Provider Notified: Dr.Butler  Orders Received? Or Actions Taken?:

## 2020-01-15 NOTE — ED Notes (Signed)
Pt assisted up in bed and provided blanket

## 2020-01-15 NOTE — ED Triage Notes (Signed)
Patient with a h/o cancer reports to the ER for feeling short of breath as well as multiple instances of diarrhea.

## 2020-01-15 NOTE — ED Notes (Signed)
Kennon Rounds MD aware of pt HR

## 2020-01-15 NOTE — ED Notes (Signed)
Send full rainbow to lab

## 2020-01-15 NOTE — H&P (Addendum)
History and Physical    Chad Gomez WUJ:[id number] DOB: 1946/03/16 DOA: 01/15/2020  PCP: [name], MD  Patient coming from: Home  I have personally briefly reviewed patient's old medical records in [place]  Chief Complaint: Weakness  HPI: Chad Gomez is a 74 y.o. male with medical history significant of type 2 diabetes, hypertension, hyperlipidemia, COPD, chronic kidney disease, BPH, chronic indwelling Foley, peripheral vascular disease status post bypass 1 year ago, ongoing tobacco use, stage IIIa non-small cell cancer of the lung status post robotic assisted lobectomy on Oct 28, 2019 undergoing chemotherapy.  Following his first round of chemotherapy he was admitted with enteritis thought to be related to his chemo, poor p.o. intake, lactic acidosis related to dehydration, and required TPN, antibiotics, NG tube placement.  He was admitted for approximately 1 week for rehydration and electrolyte repletion.  He had a second chemotherapy on 01/09/2020 and since that time has had weakness, poor appetite, and ongoing nonbloody diarrhea.  He reports chronic lower left-sided abdominal pain.  During his last admission he underwent CT scan and enteritis was noted.  He does have a large ventral hernia related to his previous bypass.  ED Course: In the ED he was noted to be neutropenic with a WBC of 0.7, acute on chronic kidney injury with a creatinine of 1.46, and anion gap of 16, a low magnesium at 1.5, lactic acid of 3.2.  He was given an IV fluid bolus as well as cefepime and Flagyl for presumed neutropenic fever.  Urinalysis reveals moderate leuks and protein.  Review of Systems: As per HPI otherwise 10 point review of systems negative.   Past Medical History:  Diagnosis Date  . BPH (benign prostatic hypertrophy)    weak stream  . Chronic kidney disease   . Complication of anesthesia    urinary retention  . COPD (chronic obstructive pulmonary disease) ([place])   . Diabetes  mellitus without complication ([place])    Type II  . History of bladder stone   . History of colon polyps    08/2013   pre-canerous  . History of kidney stones   . History of melanoma excision    LEFT EAR  . Hyperlipidemia   . Hypertension   . Iron deficiency   . Lung cancer, lingula ([place]) 11/01/2019  . Open wound    left lower jaw; followed by ENT  . Peripheral vascular disease ([place])   . Right ureteral stone   . Shingles 05-15-2015    Past Surgical History:  Procedure Laterality Date  . ABDOMINAL AORTAGRAM  11/01/2014   Procedure: Abdominal Aortagram;  Surgeon: Serafina Mitchell, MD;  Location: [facility];  Service: Cardiovascular;;  . AORTA - BILATERAL FEMORAL ARTERY BYPASS GRAFT N/A 12/28/2014   Procedure: AORTOBIFEMORAL BYPASS GRAFT;  Surgeon: Serafina Mitchell, MD;  Location: [facility];  Service: Vascular;  Laterality: N/A;  . BRONCHIAL BRUSHINGS  09/06/2019   Procedure: BRONCHIAL BRUSHINGS;  Surgeon: Collene Gobble, MD;  Location: [facility] ENDOSCOPY;  Service: Pulmonary;;  left upper lobe nodule  . BRONCHIAL WASHINGS  09/06/2019   Procedure: BRONCHIAL WASHINGS;  Surgeon: Collene Gobble, MD;  Location: [facility] ENDOSCOPY;  Service: Pulmonary;;  . COLONOSCOPY W/ POLYPECTOMY  09-13-2013  . CYSTOLITHALOPAXY OF BLADDER STONE/ TRANSRECTAL ULTRASOUND PROSTATE BX  08-19-2005  . CYSTOSCOPY W/ RETROGRADES Right 12/02/2012   Procedure: CYSTOSCOPY WITH RETROGRADE PYELOGRAM;  Surgeon: Malka So, MD;  Location: [facility];  Service: Urology;  Laterality:  Right;  Marland Kitchen CYSTOSCOPY W/ RETROGRADES Bilateral 10/13/2013   Procedure: CYSTOSCOPY WITH RETROGRADE PYELOGRAM;  Surgeon: Irine Seal, MD;  Location[facility];  Service: Urology;  Laterality: Bilateral;  . CYSTOSCOPY W/ URETERAL STENT PLACEMENT Left 04/11/2014   Procedure: CYSTOSCOPY WITH STENT REPLACEMENT;  Surgeon: Malka So, MD;  Location: [facility];  Service: Urology;  Laterality: Left;  . CYSTOSCOPY  WITH LITHOLAPAXY Right 12/02/2012   Procedure: CYSTOSCOPY WITH stone extraction;  Surgeon: Malka So, MD;  Location: [facility];  Service: Urology;  Laterality: Right;  . CYSTOSCOPY WITH LITHOLAPAXY Right 10/13/2013   Procedure: CYSTOSCOPY WITH LITHOLAPAXY, removal of urethral stone with basket;  Surgeon: Irine Seal, MD;  Location: [facility];  Service: Urology;  Laterality: Right;  . CYSTOSCOPY WITH RETROGRADE PYELOGRAM, URETEROSCOPY AND STENT PLACEMENT Left 03/21/2014   Procedure: CYSTOSCOPY WITH RETROGRADE PYELOGRAM, AND LEFT STENT PLACEMENT;  Surgeon: Alexis Frock, MD;  Location: [facility];  Service: Urology;  Laterality: Left;  . CYSTOSCOPY WITH RETROGRADE PYELOGRAM, URETEROSCOPY AND STENT PLACEMENT  09/09/2016   Procedure: CYSTOSCOPY WITH RIGHT  RETROGRADE PYELOGRAM, URETEROSCOPY WITH LASER BASKET EXTRACTION OF STONES  AND STENT PLACEMENT;  Surgeon: Irine Seal, MD;  Location: [facility];  Service: Urology;;  . Consuela Mimes WITH URETEROSCOPY Left 04/11/2014   Procedure: CYSTOSCOPY WITH URETEROSCOPY/ STONE EXTRACTION;  Surgeon: Malka So, MD;  Location: [facility];  Service: Urology;  Laterality: Left;  . FINE NEEDLE ASPIRATION  09/06/2019   Procedure: FINE NEEDLE ASPIRATION;  Surgeon: Collene Gobble, MD;  Location: [facility] ENDOSCOPY;  Service: Pulmonary;;  left upper lobe - lung  . HOLMIUM LASER APPLICATION Right 4/31/5400   Procedure: HOLMIUM LASER APPLICATION;  Surgeon: Malka So, MD;  Location: [facility];  Service: Urology;  Laterality: Right;  . HOLMIUM LASER APPLICATION Left 86/76/1950   Procedure: HOLMIUM LASER APPLICATION;  Surgeon: Malka So, MD;  Location: [facility];  Service: Urology;  Laterality: Left;  . HOLMIUM LASER APPLICATION Right 9/32/6712   Procedure: HOLMIUM LASER APPLICATION;  Surgeon: Irine Seal, MD;  Location: [facility];  Service: Urology;  Laterality: Right;   . INGUINAL HERNIA REPAIR Right 1954  . INTERCOSTAL NERVE BLOCK Left 10/28/2019   Procedure: Intercostal Nerve Block;  Surgeon: [name], MD;  Location: [place] [facility];  Service: Thoracic;  Laterality: Left;  . LITHOTRIPSY  2007 & 02/2013  . LUNG BIOPSY  09/06/2019   Procedure: LUNG BIOPSY;  Surgeon: Collene Gobble, MD;  Location: [facility] ENDOSCOPY;  Service: Pulmonary;;  left upper lob lung  . [place]  . MICROLARYNGOSCOPY Right 02/18/2016   Procedure: MICRO DIRECT LARYNGOSCOPY EXCISIONAL BIOPSY OF RIGHT VOCAL CORD MASS;  Surgeon: Leta Baptist, MD;  Location: [place];  Service: ENT;  Laterality: Right;  . [place]   left ear  . MULTIPLE TOOTH EXTRACTIONS  1994  . NEPHROLITHOTOMY Left 01/27/2013   Procedure: NEPHROLITHOTOMY PERCUTANEOUS FIRST LOOK;  Surgeon: Malka So, MD;  Location: [facility];  Service: Urology;  Laterality: Left;  . NODE DISSECTION  10/28/2019   Procedure: Node Dissection;  Surgeon: [name], MD;  Location: [facility];  Service: Thoracic;;  . PERIPHERAL VASCULAR CATHETERIZATION N/A 11/01/2014   Procedure: Lower Extremity Angiography;  Surgeon: Serafina Mitchell, MD;  Location: [facility];  Service: Cardiovascular;  Laterality: N/A;  . URETEROSCOPY Right 12/02/2012   Procedure: RIGHT  URETEROSCOPY STONE EXTRACTION WITH  STENT PLACEMENT;  Surgeon: Malka So, MD;  Location: [facility];  Service: Urology;  Laterality: Right;  Marland Kitchen VIDEO BRONCHOSCOPY  10/28/2019   VIDEO BRONCHOSCOPY WITH ENDOBRONCHIAL NAVIGATION WITH LUNG MARKINGS (N/A  . VIDEO BRONCHOSCOPY WITH ENDOBRONCHIAL NAVIGATION N/A 09/06/2019   Procedure: VIDEO BRONCHOSCOPY WITH ENDOBRONCHIAL NAVIGATION;  Surgeon: Collene Gobble, MD;  Location: [facility];  Service: Pulmonary;  Laterality: N/A;  . VIDEO BRONCHOSCOPY WITH ENDOBRONCHIAL NAVIGATION N/A 10/28/2019   Procedure: VIDEO BRONCHOSCOPY WITH ENDOBRONCHIAL NAVIGATION WITH LUNG MARKINGS;  Surgeon: [name], MD;  Location: [place];  Service: Thoracic;  Laterality: N/A;  . WEDGE RESECTION  10/28/2019   LEFT UPPER LOBE     reports that he has been smoking cigarettes. He started smoking about 60 years ago. He has a 59.00 pack-year smoking history. He has never used smokeless tobacco. He reports that he does not drink alcohol and does not use drugs.  Allergies  Allergen Reactions  . Morphine And Related Anxiety and Other (See Comments)    Combative, incoherent    . Tape Other (See Comments) and Rash    Surgical tape Adhesive tape causes blisters  . Amoxicillin Other (See Comments)    Severe stomach pain  . Naproxen Swelling    Eyes swell    Family History  Problem Relation Age of Onset  . Dementia Mother   . Hypertension Mother   . Stroke Mother 9  . Dementia Father   . Hypertension Sister   . Colon cancer Neg Hx     Prior to Admission medications   Medication Sig Start Date End Date Taking? Authorizing Provider  acetaminophen (TYLENOL) 500 MG tablet Take 2 tablets (1,000 mg total) by mouth every 6 (six) hours as needed for mild pain. 11/05/19  Yes Barrett, Erin R, PA-C  albuterol (VENTOLIN HFA) 108 (90 Base) MCG/ACT inhaler Inhale 2 puffs into the lungs every 6 (six) hours as needed for wheezing or shortness of breath. 12/08/19  Yes [name], MD  amLODipine (NORVASC) 10 MG tablet Take 10 mg by mouth daily.   Yes [provider]  aspirin EC 81 MG tablet Take 81 mg by mouth daily.   Yes [provider]  atorvastatin (LIPITOR) 40 MG tablet Take 40 mg by mouth daily. 07/15/19  Yes [provider]  cetirizine (ZYRTEC) 10 MG tablet Take 10 mg by mouth daily.   Yes [provider]  Cholecalciferol (VITAMIN D-3) 1000 units CAPS Take 1,000 Units by mouth daily.    Yes [provider]  ezetimibe (ZETIA) 10 MG tablet Take 5 mg by mouth daily.   Yes [provider]  finasteride (PROSCAR) 5 MG tablet Take 5 mg by mouth daily.  04/25/19  Yes [provider]  fluticasone (FLONASE) 50 MCG/ACT nasal spray Place 2 sprays into both nostrils daily.  08/09/19  Yes [provider]  folic acid (FOLVITE) 1 MG tablet Take 1 mg by mouth daily.   Yes [provider]  indapamide (LOZOL) 1.25 MG tablet Take 1.25 mg by mouth daily.   Yes [provider]  K Phos Mono-Sod Phos Di & Mono (K-PHOS-NEUTRAL) 2543091828 MG TABS Take 2 tablets by mouth in the morning and at bedtime. 01/02/20  Yes Patrecia Pour, MD  magnesium oxide (MAG-OX) 400 (241.3 Mg) MG tablet Take 1 tablet (400 mg total) by mouth 2 (two) times daily. 01/02/20  Yes Patrecia Pour, MD  metFORMIN Ernestina Penna)  500 MG 24 hr tablet Take 500-1,000 mg by mouth See admin instructions. Take 500 mg every morning and take 1000 mg at supper 04/25/19  Yes [provider]  potassium citrate (UROCIT-K) 10 MEQ (1080 MG) SR tablet Take 10 mEq by mouth in the morning and at bedtime.    Yes [provider]  prochlorperazine (COMPAZINE) 10 MG tablet Take 1 tablet (10 mg total) by mouth every 6 (six) hours as needed. 11/30/19  Yes Heilingoetter, Cassandra L, PA-C  tamsulosin (FLOMAX) 0.4 MG CAPS capsule Take 0.4 mg by mouth daily after supper.  08/05/19  Yes [provider]  Tiotropium Bromide-Olodaterol (STIOLTO RESPIMAT) 2.5-2.5 MCG/ACT AERS Inhale 2 puffs into the lungs daily. 09/25/17  Yes Juanito Doom, MD  traMADol (ULTRAM) 50 MG tablet Take 1 tablet (50 mg total) by mouth every 6 (six) hours as needed (mild pain). 12/08/19  Yes [name], MD    Physical Exam: Vitals:   01/15/20 1900 01/15/20 1930 01/15/20 1947 01/15/20 2030  BP: 115/84 127/72  (!) 133/79  Pulse: (!) 118 (!) 111 (!) 109 101  Resp: (!) 24 20 (!) 27 (!) 30  TempSrc:      SpO2: 95% 95% 95% 95%    Constitutional: NAD, calm, comfortable Eyes: PERRL, lids and conjunctivae normal ENMT: Mucous membranes are moist. Posterior pharynx clear of any exudate or  lesions.Normal dentition.  Neck: normal, supple, no masses, no thyromegaly Respiratory: clear to auscultation bilaterally, no wheezing, no crackles. Normal respiratory effort. No accessory muscle use.  Cardiovascular: Regular rate and rhythm, no murmurs / rubs / gallops. No extremity edema. 2+ pedal pulses. No carotid bruits.  Abdomen: Left lower quadrant tenderness, large ventral hernia which is soft. No hepatosplenomegaly. Bowel sounds positive.  Musculoskeletal: no clubbing / cyanosis. No joint deformity upper and lower extremities. Good ROM, no contractures. Normal muscle tone.  Skin: no rashes, lesions, ulcers. No induration Neurologic: CN 2-12 grossly intact. Sensation intact, DTR normal. Strength 5/5 in all 4.  Psychiatric: Normal judgment and insight. Alert and oriented x 3. Normal mood.   Labs on Admission: I have personally reviewed following labs and imaging studies  CBC: Recent Labs  Lab 01/09/20 0813 01/15/20 1704  WBC 5.4 0.7*  NEUTROABS 2.9  --   HGB 13.4 14.7  HCT 39.4 44.1  MCV 91.4 93.0  PLT 350 253   Basic Metabolic Panel: Recent Labs  Lab 01/09/20 0813 01/15/20 1704 01/15/20 1813  NA 136 136  --   K 3.6 4.7  --   CL 98 92*  --   CO2 27 28  --   GLUCOSE 151* 219*  --   BUN 17 37*  --   CREATININE 0.94 1.46*  --   CALCIUM 9.8 9.9  --   MG 1.4*  --  1.5*  PHOS 2.6  --   --    GFR: Estimated Creatinine Clearance: 45.3 mL/min (A) (by C-G formula based on SCr of 1.46 mg/dL (H)). Liver Function Tests: Recent Labs  Lab 01/09/20 0813 01/15/20 1704  AST 22 21  ALT 27 25  ALKPHOS 85 68  BILITOT 0.5 1.2  PROT 5.9* 6.0*  ALBUMIN 3.0* 3.2*   Recent Labs  Lab 01/15/20 1704  LIPASE 22   Urine analysis:    Component Value Date/Time   COLORURINE AMBER (A) 01/15/2020 1813   APPEARANCEUR HAZY (A) 01/15/2020 1813   LABSPEC 1.021 01/15/2020 1813   PHURINE 5.0 01/15/2020 1813   GLUCOSEU NEGATIVE 01/15/2020 1813  HGBUR NEGATIVE 01/15/2020 1813    BILIRUBINUR NEGATIVE 01/15/2020 1813   KETONESUR 5 (A) 01/15/2020 1813   PROTEINUR 100 (A) 01/15/2020 1813   UROBILINOGEN 1.0 01/19/2015 1312   NITRITE NEGATIVE 01/15/2020 1813   LEUKOCYTESUR MODERATE (A) 01/15/2020 1813    Radiological Exams on Admission: DG Chest 2 View  Result Date: 01/15/2020 CLINICAL DATA:  Dyspnea EXAM: CHEST - 2 VIEW COMPARISON:  01/01/2020 FINDINGS: Previously noted PICC line has been removed. The lungs are mildly hyperinflated, in keeping with changes of underlying COPD, unchanged from prior examination. No superimposed focal pulmonary infiltrate. Stable nodule within the left lung base laterally, indeterminate. Surgical staple line noted at the left hilum. No pneumothorax or pleural effusion. Cardiac size within normal limits. No acute bone abnormality. IMPRESSION: No active cardiopulmonary disease. COPD. Stable nodule at the left lung base laterally. This could be better assessed with CT examination once the patient's acute issues have resolved. Electronically Signed   By: Fidela Salisbury MD   On: 01/15/2020 17:52    EKG: Independently reviewed.  Sinus tachycardia, left atrial enlargement, right ventricular hypertrophy, prolonged QT  Assessment/Plan Active Problems:   Compulsive tobacco user syndrome   Atherosclerosis of native artery of both lower extremities with intermittent claudication (HCC)   Centrilobular emphysema (HCC)   Stage 4 very severe COPD by GOLD classification ([place])   Hypertension   Dyslipidemia   Type 2 diabetes mellitus with complication, without long-term current use of insulin (HCC)   Lung cancer, lingula (HCC)   Leukopenia due to antineoplastic chemotherapy (HCC)   Dehydration   Lactic acidosis   Chronic indwelling Foley catheter   Acute lower UTI   Protein-calorie malnutrition, severe  Dehydration Likely secondary to chemotherapy, nausea, ongoing diarrhea. Will give significant IV rehydration  Stage IIIa left lingula non-small  cell lung cancer Status post lobectomy Adjuvant chemotherapy Clear chest x-ray, no sign of recurrence  Neutropenia Related to chemotherapy Granix given  Acute kidney injury Creatinine 1.46 on admission with a BUN of 37 likely related to dehydration Baseline creatinine is 1.0 in March of this year Avoid nephrotoxic agents IV hydration  Hypomagnesemia Magnesium sulfate 2 g given IV Trend  Severe protein calorie malnutrition Previously on TPN, will hold this for now  Stage IV COPD by Gold classification On Stiolto at home we will replace this with Incruse Ellipta and Brovana Albuterol.  Ongoing tobacco use 21 mg nicotine patch  Sepsis with lactic acidosis Similar presentation to last with negative cultures at that time. Urinalysis might suggest infection, has indwelling Foley Urine and blood cultures pending Trend lactic acid Cefepime plus Flagyl in case there is an intra-abdominal component Check CT of the abdomen and pelvis  Type 2 diabetes On Metformin at home, will hold this for now Sliding scale insulin Last A1c was 6.1 on 12/26/2019  Hypertension On Norvasc and Lozol at home Holding this for now as patient is n.p.o. Lopressor IV as needed  Dyslipidemia Continue Zetia and Lipitor once no longer n.p.o.  Peripheral vascular disease On Zetia and Lipitor and aspirin on hold due to n.p.o. status  DVT prophylaxis: Lovenox SQ Code Status: Full code there would not like any long-term life-sustaining treatments Family Communication: Patient at bedside Disposition Plan: Home Consults called: None Admission status: Inpatient due to ongoing IV hydration needs, IV antibiotics, acute kidney injury, anion gap lactic acidosis and neutropenia.   Donnamae Jude MD [facility]  If 7PM-7AM, please contact night-coverage 01/15/2020, 9:08 PM

## 2020-01-15 NOTE — Progress Notes (Signed)
A consult was received from an ED physician for cefepime per pharmacy dosing.  The patient's profile has been reviewed for ht/wt/allergies/indication/available labs.   A one time order has been placed for cefepime 2 g iv once per MD.  Further antibiotics/pharmacy consults should be ordered by admitting physician if indicated.                       Thank you, [place] Form 01/15/2020  7:42 PM

## 2020-01-15 NOTE — ED Notes (Addendum)
Pt transported to CT. Emesis X3 green in color. Pt gown changed.

## 2020-01-15 NOTE — ED Provider Notes (Signed)
[facility] DEPT Provider Note   CSN: [id number] Arrival date & time: 01/15/20  1630     History Chief Complaint  Patient presents with  . Cancer    Chad Gomez is a 74 y.o. male.  He has a history of COPD, lung cancer on active chemotherapy, diabetes with a recent admission for sepsis/SBO.  History is given by his wife and patient.  She said he has been not eating or drinking much for a week and sleeping a lot.  Started with diarrhea 2 days ago.  Nonproductive cough.  Left-sided abdominal pain is been there since prior admission earlier this month.  Has not seen any blood in the diarrhea or in the urine.  Chronic Foley.  No recent falls.  No known fever chills.  The history is provided by the patient and the spouse.  Weakness Severity:  Severe Onset quality:  Gradual Duration:  1 week Timing:  Constant Progression:  Worsening Chronicity:  Recurrent Context: dehydration and recent infection   Relieved by:  Nothing Worsened by:  Activity Ineffective treatments:  Rest Associated symptoms: abdominal pain, chest pain, cough, diarrhea, difficulty walking, headaches, lethargy, nausea and shortness of breath   Associated symptoms: no dysuria, no falls, no fever, no melena, no stroke symptoms and no vomiting   Abdominal pain:    Location:  LLQ and LUQ   Quality: dull     Severity:  Moderate   Timing:  Constant   Progression:  Unchanged   Chronicity:  New Risk factors: diabetes        Past Medical History:  Diagnosis Date  . BPH (benign prostatic hypertrophy)    weak stream  . Chronic kidney disease   . Complication of anesthesia    urinary retention  . COPD (chronic obstructive pulmonary disease) ([place])   . Diabetes mellitus without complication ([place])    Type II  . History of bladder stone   . History of colon polyps    08/2013   pre-canerous  . History of kidney stones   . History of melanoma excision    LEFT EAR  . Hyperlipidemia     . Hypertension   . Iron deficiency   . Lung cancer, lingula ([place]) 11/01/2019  . Open wound    left lower jaw; followed by ENT  . Peripheral vascular disease ([place])   . Right ureteral stone   . Shingles 05-15-2015    Patient Active Problem List   Diagnosis Date Noted  . Protein-calorie malnutrition, severe 12/28/2019  . Leukopenia due to antineoplastic chemotherapy ([place]) 12/26/2019  . Sepsis ([place]) 12/26/2019  . Ventral hernia 12/26/2019  . SBO (small bowel obstruction) ([place]) 12/26/2019  . Dehydration 12/26/2019  . Lactic acidosis 12/26/2019  . Chronic indwelling Foley catheter 12/26/2019  . Acute lower UTI 12/26/2019  . Encounter for antineoplastic immunotherapy 12/19/2019  . Adenocarcinoma, lung, left ([place]) 12/16/2019  . Encounter for antineoplastic chemotherapy 11/30/2019  . Goals of care, counseling/discussion 11/09/2019  . Lung cancer, lingula ([place]) 11/01/2019  . S/P partial lobectomy of lung 10/28/2019  . Dyslipidemia 10/06/2019  . Type 2 diabetes mellitus with complication, without long-term current use of insulin ([place]) 10/06/2019  . Tobacco abuse 09/06/2019  . Hypertension 09/06/2019  . Nephrolithiasis 09/06/2019  . Melanoma ([place]) 09/06/2019  . Centrilobular emphysema ([place]) 05/23/2019  . Stage 4 very severe COPD by GOLD classification ([place]) 05/23/2019  . Atherosclerosis of native artery of both lower extremities with intermittent claudication ([place]) 05/26/2016  . S/P  aortobifemoral bypass surgery 05/26/2016  . Ureteral stone with hydronephrosis 03/21/2014  . Ulnar neuropathy 02/07/2014  . Compulsive tobacco user syndrome 02/07/2014  . Allergic rhinitis 02/07/2014  . Malignant melanoma ([place]) 02/07/2014    Past Surgical History:  Procedure Laterality Date  . ABDOMINAL AORTAGRAM  11/01/2014   Procedure: Abdominal Aortagram;  Surgeon: Serafina Mitchell, MD;  Location: [facility];  Service: Cardiovascular;;  . AORTA - BILATERAL FEMORAL ARTERY BYPASS GRAFT N/A  12/28/2014   Procedure: AORTOBIFEMORAL BYPASS GRAFT;  Surgeon: Serafina Mitchell, MD;  Location: [facility];  Service: Vascular;  Laterality: N/A;  . BRONCHIAL BRUSHINGS  09/06/2019   Procedure: BRONCHIAL BRUSHINGS;  Surgeon: Collene Gobble, MD;  Location: [facility] ENDOSCOPY;  Service: Pulmonary;;  left upper lobe nodule  . BRONCHIAL WASHINGS  09/06/2019   Procedure: BRONCHIAL WASHINGS;  Surgeon: Collene Gobble, MD;  Location: [facility] ENDOSCOPY;  Service: Pulmonary;;  . COLONOSCOPY W/ POLYPECTOMY  09-13-2013  . CYSTOLITHALOPAXY OF BLADDER STONE/ TRANSRECTAL ULTRASOUND PROSTATE BX  08-19-2005  . CYSTOSCOPY W/ RETROGRADES Right 12/02/2012   Procedure: CYSTOSCOPY WITH RETROGRADE PYELOGRAM;  Surgeon: Malka So, MD;  Location[facility];  Service: Urology;  Laterality: Right;  . CYSTOSCOPY W/ RETROGRADES Bilateral 10/13/2013   Procedure: CYSTOSCOPY WITH RETROGRADE PYELOGRAM;  Surgeon: Irine Seal, MD;  Location: [facility];  Service: Urology;  Laterality: Bilateral;  . CYSTOSCOPY W/ URETERAL STENT PLACEMENT Left 04/11/2014   Procedure: CYSTOSCOPY WITH STENT REPLACEMENT;  Surgeon: Malka So, MD;  Location: [facility];  Service: Urology;  Laterality: Left;  . CYSTOSCOPY WITH LITHOLAPAXY Right 12/02/2012   Procedure: CYSTOSCOPY WITH stone extraction;  Surgeon: Malka So, MD;  Location: [facility];  Service: Urology;  Laterality: Right;  . CYSTOSCOPY WITH LITHOLAPAXY Right 10/13/2013   Procedure: CYSTOSCOPY WITH LITHOLAPAXY, removal of urethral stone with basket;  Surgeon: Irine Seal, MD;  Location: [facility];  Service: Urology;  Laterality: Right;  . CYSTOSCOPY WITH RETROGRADE PYELOGRAM, URETEROSCOPY AND STENT PLACEMENT Left 03/21/2014   Procedure: CYSTOSCOPY WITH RETROGRADE PYELOGRAM, AND LEFT STENT PLACEMENT;  Surgeon: Alexis Frock, MD;  Location: [facility];  Service: Urology;  Laterality: Left;  . CYSTOSCOPY WITH RETROGRADE PYELOGRAM,  URETEROSCOPY AND STENT PLACEMENT  09/09/2016   Procedure: CYSTOSCOPY WITH RIGHT  RETROGRADE PYELOGRAM, URETEROSCOPY WITH LASER BASKET EXTRACTION OF STONES  AND STENT PLACEMENT;  Surgeon: Irine Seal, MD;  Location: [facility];  Service: Urology;;  . Consuela Mimes WITH URETEROSCOPY Left 04/11/2014   Procedure: CYSTOSCOPY WITH URETEROSCOPY/ STONE EXTRACTION;  Surgeon: Malka So, MD;  Location: [facility];  Service: Urology;  Laterality: Left;  . FINE NEEDLE ASPIRATION  09/06/2019   Procedure: FINE NEEDLE ASPIRATION;  Surgeon: Collene Gobble, MD;  Location: [facility] ENDOSCOPY;  Service: Pulmonary;;  left upper lobe - lung  . HOLMIUM LASER APPLICATION Right 1/74/9449   Procedure: HOLMIUM LASER APPLICATION;  Surgeon: Malka So, MD;  Location: [facility];  Service: Urology;  Laterality: Right;  . HOLMIUM LASER APPLICATION Left 67/59/1638   Procedure: HOLMIUM LASER APPLICATION;  Surgeon: Malka So, MD;  Location: [facility];  Service: Urology;  Laterality: Left;  . HOLMIUM LASER APPLICATION Right 4/66/5993   Procedure: HOLMIUM LASER APPLICATION;  Surgeon: Irine Seal, MD;  Location: [facility];  Service: Urology;  Laterality: Right;  . INGUINAL HERNIA REPAIR Right 1954  . INTERCOSTAL NERVE BLOCK Left 10/28/2019   Procedure:  Intercostal Nerve Block;  Surgeon: [name], MD;  Location: [facility];  Service: Thoracic;  Laterality: Left;  . LITHOTRIPSY  2007 & 02/2013  . LUNG BIOPSY  09/06/2019   Procedure: LUNG BIOPSY;  Surgeon: Collene Gobble, MD;  Location: [facility] ENDOSCOPY;  Service: Pulmonary;;  left upper lob lung  . [place]  . MICROLARYNGOSCOPY Right 02/18/2016   Procedure: MICRO DIRECT LARYNGOSCOPY EXCISIONAL BIOPSY OF RIGHT VOCAL CORD MASS;  Surgeon: Leta Baptist, MD;  Location: [facility];  Service: ENT;  Laterality: Right;  . [place]   left ear  . MULTIPLE TOOTH EXTRACTIONS   1994  . NEPHROLITHOTOMY Left 01/27/2013   Procedure: NEPHROLITHOTOMY PERCUTANEOUS FIRST LOOK;  Surgeon: Malka So, MD;  Location: [facility];  Service: Urology;  Laterality: Left;  . NODE DISSECTION  10/28/2019   Procedure: Node Dissection;  Surgeon: [name], MD;  Location: [facility] OR;  Service: Thoracic;;  . PERIPHERAL VASCULAR CATHETERIZATION N/A 11/01/2014   Procedure: Lower Extremity Angiography;  Surgeon: Serafina Mitchell, MD;  Location: [facility] CV LAB;  Service: Cardiovascular;  Laterality: N/A;  . URETEROSCOPY Right 12/02/2012   Procedure: RIGHT URETEROSCOPY STONE EXTRACTION WITH  STENT PLACEMENT;  Surgeon: Malka So, MD;  Location: [facility];  Service: Urology;  Laterality: Right;  Marland Kitchen VIDEO BRONCHOSCOPY  10/28/2019   VIDEO BRONCHOSCOPY WITH ENDOBRONCHIAL NAVIGATION WITH LUNG MARKINGS (N/A  . VIDEO BRONCHOSCOPY WITH ENDOBRONCHIAL NAVIGATION N/A 09/06/2019   Procedure: VIDEO BRONCHOSCOPY WITH ENDOBRONCHIAL NAVIGATION;  Surgeon: Collene Gobble, MD;  Location: [facility] ENDOSCOPY;  Service: Pulmonary;  Laterality: N/A;  . VIDEO BRONCHOSCOPY WITH ENDOBRONCHIAL NAVIGATION N/A 10/28/2019   Procedure: VIDEO BRONCHOSCOPY WITH ENDOBRONCHIAL NAVIGATION WITH LUNG MARKINGS;  Surgeon: [name], MD;  Location: [facility];  Service: Thoracic;  Laterality: N/A;  . WEDGE RESECTION  10/28/2019   LEFT UPPER LOBE       Family History  Problem Relation Age of Onset  . Dementia Mother   . Hypertension Mother   . Stroke Mother 83  . Dementia Father   . Hypertension Sister   . Colon cancer Neg Hx     Social History   Tobacco Use  . Smoking status: Current Every Day Smoker    Packs/day: 1.00    Years: 59.00    Pack years: 59.00    Types: Cigarettes    Start date: 06/24/1959  . Smokeless tobacco: Never Used  Vaping Use  . Vaping Use: Former  Substance Use Topics  . Alcohol use: No    Alcohol/week: 0.0 standard drinks  . Drug use: No    Home Medications Prior to Admission  medications   Medication Sig Start Date End Date Taking? Authorizing Provider  acetaminophen (TYLENOL) 500 MG tablet Take 2 tablets (1,000 mg total) by mouth every 6 (six) hours as needed for mild pain. 11/05/19   Barrett, Erin R, PA-C  albuterol (VENTOLIN HFA) 108 (90 Base) MCG/ACT inhaler Inhale 2 puffs into the lungs every 6 (six) hours as needed for wheezing or shortness of breath. 12/08/19   [name], MD  amLODipine (NORVASC) 10 MG tablet Take 10 mg by mouth daily.    [provider]  aspirin EC 81 MG tablet Take 81 mg by mouth daily.    [provider]  atorvastatin (LIPITOR) 40 MG tablet Take 40 mg by mouth daily. 07/15/19   [provider]  cetirizine (ZYRTEC) 10 MG tablet Take  10 mg by mouth daily.    [provider]  Cholecalciferol (VITAMIN D-3) 1000 units CAPS Take 1,000 Units by mouth daily.     [provider]  ezetimibe (ZETIA) 10 MG tablet Take 5 mg by mouth daily.    [provider]  finasteride (PROSCAR) 5 MG tablet Take 5 mg by mouth daily. 04/25/19   [provider]  fluticasone (FLONASE) 50 MCG/ACT nasal spray Place 2 sprays into both nostrils daily.  08/09/19   [provider]  folic acid (FOLVITE) 1 MG tablet Take 1 mg by mouth daily.    [provider]  indapamide (LOZOL) 1.25 MG tablet Take 1.25 mg by mouth daily.    [provider]  K Phos Mono-Sod Phos Di & Mono (K-PHOS-NEUTRAL) [phone] MG TABS Take 2 tablets by mouth in the morning and at bedtime. 01/02/20   Patrecia Pour, MD  magnesium oxide (MAG-OX) 400 (241.3 Mg) MG tablet Take 1 tablet (400 mg total) by mouth 2 (two) times daily. 01/02/20   Patrecia Pour, MD  metFORMIN (GLUCOPHAGE-XR) 500 MG 24 hr tablet Take 500-1,000 mg by mouth See admin instructions. Take 500 mg every morning and take 1000 mg at supper 04/25/19   [provider]  potassium citrate (UROCIT-K) 10 MEQ (1080 MG) SR tablet Take 10 mEq by mouth in  the morning and at bedtime.     [provider]  prochlorperazine (COMPAZINE) 10 MG tablet Take 1 tablet (10 mg total) by mouth every 6 (six) hours as needed. 11/30/19   Heilingoetter, Cassandra L, PA-C  tamsulosin (FLOMAX) 0.4 MG CAPS capsule Take 0.4 mg by mouth daily after supper.  08/05/19   [provider]  Tiotropium Bromide-Olodaterol (STIOLTO RESPIMAT) 2.5-2.5 MCG/ACT AERS Inhale 2 puffs into the lungs daily. 09/25/17   Juanito Doom, MD  traMADol (ULTRAM) 50 MG tablet Take 1 tablet (50 mg total) by mouth every 6 (six) hours as needed (mild pain). 12/08/19   [name], MD    Allergies    Morphine and related, Tape, Amoxicillin, and Naproxen  Review of Systems   Review of Systems  Constitutional: Positive for activity change, appetite change and fatigue. Negative for fever.  HENT: Negative for sore throat.   Eyes: Negative for pain.  Respiratory: Positive for cough and shortness of breath.   Cardiovascular: Positive for chest pain.  Gastrointestinal: Positive for abdominal pain, constipation, diarrhea and nausea. Negative for melena and vomiting.  Genitourinary: Negative for dysuria.  Musculoskeletal: Negative for falls and neck stiffness.  Skin: Negative for rash.  Neurological: Positive for weakness and headaches.    Physical Exam Updated Vital Signs BP 119/80   Pulse 99   Temp 98.4 F (36.9 C) (Oral)   Resp 20   Wt 70.4 kg   SpO2 98%   BMI 22.27 kg/m   Physical Exam Vitals and nursing note reviewed.  Constitutional:      Appearance: He is well-developed. He is ill-appearing.  HENT:     Head: Normocephalic and atraumatic.  Eyes:     Conjunctiva/sclera: Conjunctivae normal.  Cardiovascular:     Rate and Rhythm: Regular rhythm. Tachycardia present.     Pulses: Normal pulses.     Heart sounds: No murmur heard.   Pulmonary:     Effort: Pulmonary effort is normal. No respiratory distress.     Breath sounds: Normal breath sounds.    Abdominal:     Palpations: Abdomen is soft.  Tenderness: There is abdominal tenderness (luq/llq). There is no guarding or rebound.  Genitourinary:    Comments: Foley catheter in place Musculoskeletal:        General: No deformity or signs of injury.     Cervical back: Neck supple.  Skin:    General: Skin is warm and dry.     Capillary Refill: Capillary refill takes less than 2 seconds.     Findings: No rash.  Neurological:     Mental Status: He is alert. Mental status is at baseline.     ED Results / Procedures / Treatments   Labs (all labs ordered are listed, but only abnormal results are displayed) Labs Reviewed  COMPREHENSIVE METABOLIC PANEL - Abnormal; Notable for the following components:      Result Value   Chloride 92 (*)    Glucose, Bld 219 (*)    BUN 37 (*)    Creatinine, Ser 1.46 (*)    Total Protein 6.0 (*)    Albumin 3.2 (*)    GFR calc non Af Amer 47 (*)    GFR calc Af Amer 55 (*)    Anion gap 16 (*)    All other components within normal limits  CBC - Abnormal; Notable for the following components:   WBC 0.7 (*)    All other components within normal limits  URINALYSIS, ROUTINE W REFLEX MICROSCOPIC - Abnormal; Notable for the following components:   Color, Urine AMBER (*)    APPearance HAZY (*)    Ketones, ur 5 (*)    Protein, ur 100 (*)    Leukocytes,Ua MODERATE (*)    Bacteria, UA RARE (*)    All other components within normal limits  MAGNESIUM - Abnormal; Notable for the following components:   Magnesium 1.5 (*)    All other components within normal limits  LACTIC ACID, PLASMA - Abnormal; Notable for the following components:   Lactic Acid, Venous 3.2 (*)    All other components within normal limits  LACTIC ACID, PLASMA - Abnormal; Notable for the following components:   Lactic Acid, Venous 3.4 (*)    All other components within normal limits  DIFFERENTIAL - Abnormal; Notable for the following components:   Neutro Abs 0.3 (*)    Lymphs Abs  0.4 (*)    Monocytes Absolute 0.0 (*)    All other components within normal limits  LACTIC ACID, PLASMA - Abnormal; Notable for the following components:   Lactic Acid, Venous 2.6 (*)    All other components within normal limits  MAGNESIUM - Abnormal; Notable for the following components:   Magnesium 1.6 (*)    All other components within normal limits  COMPREHENSIVE METABOLIC PANEL - Abnormal; Notable for the following components:   Potassium 3.3 (*)    Glucose, Bld 161 (*)    BUN 32 (*)    Calcium 8.0 (*)    Total Protein 4.8 (*)    Albumin 2.5 (*)    All other components within normal limits  CBC WITH DIFFERENTIAL/PLATELET - Abnormal; Notable for the following components:   WBC 0.4 (*)    RBC 3.93 (*)    Hemoglobin 12.3 (*)    HCT 36.1 (*)    Platelets 120 (*)    Neutro Abs 0.2 (*)    Lymphs Abs 0.1 (*)    Monocytes Absolute 0.0 (*)    All other components within normal limits  GLUCOSE, CAPILLARY - Abnormal; Notable for the following components:   Glucose-Capillary 149 (*)  All other components within normal limits  CBG MONITORING, ED - Abnormal; Notable for the following components:   Glucose-Capillary 110 (*)    All other components within normal limits  CULTURE, BLOOD (ROUTINE X 2)  CULTURE, BLOOD (ROUTINE X 2)  C DIFFICILE QUICK SCREEN W PCR REFLEX  SARS CORONAVIRUS 2 BY RT PCR (HOSPITAL ORDER, PERFORMED IN [facility])  URINE CULTURE  GASTROINTESTINAL PANEL BY PCR, STOOL (REPLACES STOOL CULTURE)  LIPASE, BLOOD  LACTIC ACID, PLASMA    EKG EKG Interpretation  Date/Time:  Sunday January 15 2020 17:12:42 EDT Ventricular Rate:  137 PR Interval:    QRS Duration: 72 QT Interval:  346 QTC Calculation: 523 R Axis:   134 Text Interpretation: Sinus or ectopic atrial tachycardia Probable left atrial enlargement Consider right ventricular hypertrophy Prolonged QT interval No significant change since prior 3/21 Confirmed by Aletta Edouard [phone]) on  01/15/2020 5:18:37 PM   Radiology DG Chest 2 View  Result Date: 01/15/2020 CLINICAL DATA:  Dyspnea EXAM: CHEST - 2 VIEW COMPARISON:  01/01/2020 FINDINGS: Previously noted PICC line has been removed. The lungs are mildly hyperinflated, in keeping with changes of underlying COPD, unchanged from prior examination. No superimposed focal pulmonary infiltrate. Stable nodule within the left lung base laterally, indeterminate. Surgical staple line noted at the left hilum. No pneumothorax or pleural effusion. Cardiac size within normal limits. No acute bone abnormality. IMPRESSION: No active cardiopulmonary disease. COPD. Stable nodule at the left lung base laterally. This could be better assessed with CT examination once the patient's acute issues have resolved. Electronically Signed   By: Fidela Salisbury MD   On: 01/15/2020 17:52   CT ABDOMEN PELVIS W CONTRAST  Result Date: 01/15/2020 CLINICAL DATA:  74 year old male with abdominal pain. EXAM: CT ABDOMEN AND PELVIS WITH CONTRAST TECHNIQUE: Multidetector CT imaging of the abdomen and pelvis was performed using the standard protocol following bolus administration of intravenous contrast. CONTRAST:  110mL OMNIPAQUE IOHEXOL 300 MG/ML  SOLN COMPARISON:  CT abdomen pelvis dated 12/26/2019. FINDINGS: Lower chest: The visualized lung bases are clear. No intra-abdominal free air or free fluid. Hepatobiliary: Subcentimeter hypodense lesion in the right lobe of the liver inferiorly with a small internal enhancing focus similar to prior CT is not characterized. MRI may provide better characterization if clinically indicated. There is mild intrahepatic biliary ductal dilatation. There are multiple stones within the gallbladder. There is mild haziness of the gallbladder wall with trace pericholecystic fluid. Ultrasound may provide better evaluation of the gallbladder if there is clinical concern for acute cholecystitis. Pancreas: Unremarkable. No pancreatic ductal dilatation or  surrounding inflammatory changes. Spleen: Normal in size without focal abnormality. Adrenals/Urinary Tract: The right adrenal gland is unremarkable. There is a 1 cm indeterminate left adrenal nodule. There is severe right renal parenchyma atrophy and cortical thinning. There is no hydronephrosis on either side. There is symmetric enhancement and excretion of contrast by both kidneys. Several subcentimeter bilateral renal hypodense lesions are too small to characterize. The visualized ureters appear unremarkable. The urinary bladder is decompressed around a Foley catheter. Apparent soft tissue density at the base of the bladder likely related to enlarged prostate gland with median lobe hypertrophy. Stomach/Bowel: There is diffuse long segment thickening and inflammatory changes of the small bowel loops throughout the abdomen consistent with enteritis. Overall the degree of inflammation is similar to prior CT. Multiple ventral hernias containing several loops of small bowel as seen on the prior CT. There is abutment of these loops of bowel  to the anterior peritoneal wall compatible with adhesions. No definite evidence of bowel obstruction at this time. The appendix is unremarkable. Vascular/Lymphatic: There is advanced aortoiliac atherosclerotic disease. There is occlusion of the distal abdominal aorta and an aortobifemoral bypass graft. The bypass graft is patent. The SMV, splenic vein, and main portal vein are patent. No portal venous gas. There is no adenopathy. Reproductive: Enlarged prostate gland measuring approximately 5 cm in transverse axial diameter. There is median lobe hypertrophy. Other: None Musculoskeletal: Bilateral L5 pars defects. No acute osseous pathology. IMPRESSION: 1. Diffuse thickening and inflammatory changes of normal caliber bowel loops may represent enteritis. Bowel ischemia is not entirely excluded. Clinical correlation is recommended. There is however no pneumatosis, free air, or portal  venous gas. No definite evidence of obstruction. 2. Multiple ventral hernia is similar to prior CT containing segments of small bowel with findings of adhesions to the anterior peritoneal wall. 3. Cholelithiasis with mild haziness of the gallbladder wall. Ultrasound may provide better evaluation of the gallbladder if there is clinical concern for acute cholecystitis. 4. Occlusion of the distal abdominal aorta and an aortobifemoral bypass graft. The bypass graft is patent. 5. Aortic Atherosclerosis (ICD10-I70.0). Electronically Signed   By: Anner Crete M.D.   On: 01/15/2020 22:53    Procedures Procedures (including critical care time)  Medications Ordered in ED Medications  umeclidinium bromide (INCRUSE ELLIPTA) 62.5 MCG/INH 1 puff (1 puff Inhalation Given 01/16/20 0730)  arformoterol (BROVANA) nebulizer solution 15 mcg (15 mcg Nebulization Not Given 01/16/20 0904)  enoxaparin (LOVENOX) injection 40 mg (40 mg Subcutaneous Given 01/15/20 2347)  0.9 %  sodium chloride infusion ( Intravenous New Bag/Given 01/16/20 0750)  fentaNYL (SUBLIMAZE) injection 12.5-50 mcg (has no administration in time range)  ondansetron (ZOFRAN) tablet 4 mg ( Oral See Alternative 01/16/20 0809)    Or  ondansetron (ZOFRAN) injection 4 mg (4 mg Intravenous Given 01/16/20 0809)  nicotine (NICODERM CQ - dosed in mg/24 hours) patch 21 mg (21 mg Transdermal Patch Removed 01/16/20 1009)  metroNIDAZOLE (FLAGYL) IVPB 500 mg (500 mg Intravenous New Bag/Given 01/16/20 0500)  insulin aspart (novoLOG) injection 0-9 Units (1 Units Subcutaneous Given 01/16/20 0759)  metoprolol tartrate (LOPRESSOR) injection 5 mg (has no administration in time range)  ceFEPIme (MAXIPIME) 2 g in sodium chloride 0.9 % 100 mL IVPB (2 g Intravenous New Bag/Given 01/16/20 0759)  albuterol (PROVENTIL) (2.5 MG/3ML) 0.083% nebulizer solution 2.5 mg (has no administration in time range)  Chlorhexidine Gluconate Cloth 2 % PADS 6 each (6 each Topical Given 01/16/20 1009)   sodium chloride flush (NS) 0.9 % injection 3 mL (3 mLs Intravenous Given 01/15/20 1845)  sodium chloride 0.9 % bolus 1,000 mL (0 mLs Intravenous Stopped 01/15/20 2123)  magnesium sulfate IVPB 2 g 50 mL (0 g Intravenous Stopped 01/15/20 2123)  lactated ringers bolus 1,000 mL (0 mLs Intravenous Stopped 01/15/20 2123)    And  lactated ringers bolus 250 mL (0 mLs Intravenous Stopped 01/15/20 2246)  ceFEPIme (MAXIPIME) 2 g in sodium chloride 0.9 % 100 mL IVPB (0 g Intravenous Stopped 01/15/20 2123)  metroNIDAZOLE (FLAGYL) IVPB 500 mg (0 mg Intravenous Stopped 01/15/20 2123)  iohexol (OMNIPAQUE) 300 MG/ML solution 100 mL (100 mLs Intravenous Contrast Given 01/15/20 2229)  sodium chloride 0.9 % bolus 1,000 mL (1,000 mLs Intravenous New Bag/Given 01/15/20 2313)    ED Course  I have reviewed the triage vital signs and the nursing notes.  Pertinent labs & imaging results that were available during my care of the  patient were reviewed by me and considered in my medical decision making (see chart for details).  Clinical Course as of Jan 15 1121  Sun Jan 15, 2020  1724 Patient's EKG showing QTC prolongation.  Magnesium came back at 1.5 and ordered IV repletion.   [MB]  5883 Chest x-ray interpreted by me as COPD no gross infiltrates.   [MB]  1954 Discussed with Dr. Lorenso Courier from oncology who said he would let Dr. Julien Nordmann know   [MB]  2055 Discussed with Dr. Kennon Rounds [facility] hospital[facility] who will evaluate the patient for admission.   [MB]    Clinical Course User Index [MB] Hayden Rasmussen, MD   MDM Rules/Calculators/A&P                         This patient complains of poor p.o. intake lethargy diarrhea; this involves an extensive number of treatment Options and is a complaint that carries with it a high risk of complications and Morbidity. The differential includes dehydration, AKI, sepsis, Sirs, colitis, C. difficile, diverticulitis  I ordered, reviewed and interpreted labs, which included CBC which  showed critically low white blood cell count consistent with neutropenia, chemistries with elevated glucose elevated BUN and creatinine reflecting some element of dehydration, urinalysis possibly infected although patient is a chronic Foley and could be colonized, lactate elevated, magnesium low I ordered medication IV fluids IV antibiotics for sepsis, IV magnesium for prolonged QTC I ordered imaging studies which included chest x-ray and I independently    visualized and interpreted imaging which showed COPD no gross infiltrates Additional history obtained from patient's wife Previous records obtained and reviewed in epic including last admission a few weeks ago with similar presentation I consulted Dr. Lorenso Courier oncology and Dr. Kennon Rounds [facility] and discussed lab and imaging findings  Critical Interventions: Recognition and management of sepsis with early fluids and antibiotics  After the interventions stated above, I reevaluated the patient and found patient still to remain symptomatic and tachycardic.  Will need admission for continued management.   Final Clinical Impression(s) / ED Diagnoses Final diagnoses:  Sepsis, due to unspecified organism, unspecified whether acute organ dysfunction present ([place])  AKI (acute kidney injury) ([place])  Diarrhea, unspecified type  Hypomagnesemia  Malignant neoplasm of lung, unspecified laterality, unspecified part of lung [facility])    Rx / DC Orders ED Discharge Orders    None       Hayden Rasmussen, MD 01/16/20 1129

## 2020-01-16 ENCOUNTER — Inpatient Hospital Stay: Payer: Medicare Other

## 2020-01-16 ENCOUNTER — Other Ambulatory Visit: Payer: Self-pay | Admitting: *Deleted

## 2020-01-16 ENCOUNTER — Telehealth: Payer: Self-pay | Admitting: *Deleted

## 2020-01-16 ENCOUNTER — Encounter (HOSPITAL_COMMUNITY): Payer: Self-pay

## 2020-01-16 DIAGNOSIS — N179 Acute kidney failure, unspecified: Secondary | ICD-10-CM | POA: Diagnosis not present

## 2020-01-16 DIAGNOSIS — C3490 Malignant neoplasm of unspecified part of unspecified bronchus or lung: Secondary | ICD-10-CM

## 2020-01-16 DIAGNOSIS — I10 Essential (primary) hypertension: Secondary | ICD-10-CM

## 2020-01-16 DIAGNOSIS — T451X5A Adverse effect of antineoplastic and immunosuppressive drugs, initial encounter: Secondary | ICD-10-CM

## 2020-01-16 DIAGNOSIS — D701 Agranulocytosis secondary to cancer chemotherapy: Secondary | ICD-10-CM

## 2020-01-16 DIAGNOSIS — E872 Acidosis: Secondary | ICD-10-CM

## 2020-01-16 DIAGNOSIS — R197 Diarrhea, unspecified: Secondary | ICD-10-CM | POA: Diagnosis not present

## 2020-01-16 LAB — CBC WITH DIFFERENTIAL/PLATELET
Abs Immature Granulocytes: 0.03 10*3/uL (ref 0.00–0.07)
Basophils Absolute: 0 10*3/uL (ref 0.0–0.1)
Basophils Relative: 0 %
Eosinophils Absolute: 0 10*3/uL (ref 0.0–0.5)
Eosinophils Relative: 3 %
HCT: 36.1 % — ABNORMAL LOW (ref 39.0–52.0)
Hemoglobin: 12.3 g/dL — ABNORMAL LOW (ref 13.0–17.0)
Immature Granulocytes: 8 %
Lymphocytes Relative: 35 %
Lymphs Abs: 0.1 10*3/uL — ABNORMAL LOW (ref 0.7–4.0)
MCH: 31.3 pg (ref 26.0–34.0)
MCHC: 34.1 g/dL (ref 30.0–36.0)
MCV: 91.9 fL (ref 80.0–100.0)
Monocytes Absolute: 0 10*3/uL — ABNORMAL LOW (ref 0.1–1.0)
Monocytes Relative: 10 %
Neutro Abs: 0.2 10*3/uL — ABNORMAL LOW (ref 1.7–7.7)
Neutrophils Relative %: 44 %
Platelets: 120 10*3/uL — ABNORMAL LOW (ref 150–400)
RBC: 3.93 MIL/uL — ABNORMAL LOW (ref 4.22–5.81)
RDW: 13.2 % (ref 11.5–15.5)
WBC: 0.4 10*3/uL — CL (ref 4.0–10.5)
nRBC: 0 % (ref 0.0–0.2)

## 2020-01-16 LAB — GLUCOSE, CAPILLARY
Glucose-Capillary: 149 mg/dL — ABNORMAL HIGH (ref 70–99)
Glucose-Capillary: 118 mg/dL — ABNORMAL HIGH (ref 70–99)
Glucose-Capillary: 116 mg/dL — ABNORMAL HIGH (ref 70–99)
Glucose-Capillary: 122 mg/dL — ABNORMAL HIGH (ref 70–99)

## 2020-01-16 LAB — C DIFFICILE QUICK SCREEN W PCR REFLEX
C Diff antigen: NEGATIVE
C Diff interpretation: NOT DETECTED
C Diff toxin: NEGATIVE

## 2020-01-16 LAB — COMPREHENSIVE METABOLIC PANEL
ALT: 18 U/L (ref 0–44)
AST: 16 U/L (ref 15–41)
Albumin: 2.5 g/dL — ABNORMAL LOW (ref 3.5–5.0)
Alkaline Phosphatase: 45 U/L (ref 38–126)
Anion gap: 10 (ref 5–15)
BUN: 32 mg/dL — ABNORMAL HIGH (ref 8–23)
CO2: 26 mmol/L (ref 22–32)
Calcium: 8 mg/dL — ABNORMAL LOW (ref 8.9–10.3)
Chloride: 101 mmol/L (ref 98–111)
Creatinine, Ser: 1.12 mg/dL (ref 0.61–1.24)
GFR calc Af Amer: >60 mL/min (ref >60)
GFR calc non Af Amer: >60 mL/min (ref >60)
Glucose, Bld: 161 mg/dL — ABNORMAL HIGH (ref 70–99)
Potassium: 3.3 mmol/L — ABNORMAL LOW (ref 3.5–5.1)
Sodium: 137 mmol/L (ref 135–145)
Total Bilirubin: 0.7 mg/dL (ref 0.3–1.2)
Total Protein: 4.8 g/dL — ABNORMAL LOW (ref 6.5–8.1)

## 2020-01-16 LAB — LACTIC ACID, PLASMA
Lactic Acid, Venous: 2.6 mmol/L (ref 0.5–1.9)
Lactic Acid, Venous: 1.1 mmol/L (ref 0.5–1.9)

## 2020-01-16 LAB — URINE CULTURE: Culture: 80000 — AB

## 2020-01-16 LAB — MAGNESIUM: Magnesium: 1.6 mg/dL — ABNORMAL LOW (ref 1.7–2.4)

## 2020-01-16 MED ORDER — CHLORHEXIDINE GLUCONATE CLOTH 2 % EX PADS
6.0000 | MEDICATED_PAD | Freq: Every day | CUTANEOUS | Status: DC
  Administered 2020-01-16 – 2020-01-20 (×5): 6 via TOPICAL

## 2020-01-16 MED ORDER — MAGNESIUM SULFATE 2 GM/50ML IV SOLN
2.0000 g | Freq: Once | INTRAVENOUS | Status: AC
  Administered 2020-01-16: 2 g via INTRAVENOUS
  Filled 2020-01-16: qty 50

## 2020-01-16 MED ORDER — PROCHLORPERAZINE EDISYLATE 10 MG/2ML IJ SOLN
5.0000 mg | INTRAMUSCULAR | Status: DC | PRN
  Administered 2020-01-16 – 2020-01-17 (×2): 5 mg via INTRAVENOUS
  Filled 2020-01-16 (×2): qty 2

## 2020-01-16 MED ORDER — TBO-FILGRASTIM 480 MCG/0.8ML ~~LOC~~ SOSY
480.0000 ug | PREFILLED_SYRINGE | Freq: Every day | SUBCUTANEOUS | Status: AC
  Administered 2020-01-16 – 2020-01-19 (×4): 480 ug via SUBCUTANEOUS
  Filled 2020-01-16 (×5): qty 0.8

## 2020-01-16 MED ORDER — POTASSIUM CHLORIDE CRYS ER 20 MEQ PO TBCR
40.0000 meq | EXTENDED_RELEASE_TABLET | Freq: Once | ORAL | Status: AC
  Administered 2020-01-16: 40 meq via ORAL
  Filled 2020-01-16: qty 2

## 2020-01-16 NOTE — Telephone Encounter (Signed)
Patients wife called to give report that patient was admitted to hospital and wanted Dr. Julien Nordmann to know.

## 2020-01-16 NOTE — Patient Outreach (Signed)
[facility]) Care Management  01/16/2020  ORESTES GEIMAN [birth year] [id number]   Received [place] [facility] / Epic in-basket message, patient admitted on 01/15/2020, remains in-patient for Dehydration likely secondary to chemotherapy, nausea, and ongoing diarrhea.  Received [place] / Epic in-basket message from [facility] Liaison is aware of admission, will follow up for discharge planning / disposition.  No telephone outreach at from this RNCM at this time, will follow up if discharged within 10 business days, and will close if discharged greater than 10 days.      Lydon Vansickle H. Annia Friendly, BSN, [facility] Telephonic CM Phone: [phone] Fax: [phone]

## 2020-01-16 NOTE — Progress Notes (Signed)
Dr. Kennon Rounds notified of 2nd lactic being elevated, MD ordered additional lactic acid order. It was noted earlier MD responded with additional fluids

## 2020-01-16 NOTE — Progress Notes (Addendum)
[facility] PROGRESS NOTE   Chad Gomez SVX:[id number] DOB: 1946-02-13 DOA: 01/15/2020  PCP: [name], MD  Brief History/Interval Summary: 74 y.o. male with medical history significant of type 2 diabetes, hypertension, hyperlipidemia, COPD, chronic kidney disease, BPH, chronic indwelling Foley, peripheral vascular disease status post bypass 1 year ago, ongoing tobacco use, stage IIIa non-small cell cancer of the lung status post robotic assisted lobectomy on Oct 28, 2019 undergoing chemotherapy.  Following his first round of chemotherapy he was admitted with enteritis thought to be related to his chemo, poor p.o. intake, lactic acidosis related to dehydration, and required TPN, antibiotics, NG tube placement.  He was admitted for approximately 1 week for rehydration and electrolyte repletion.  He had a second chemotherapy on 01/09/2020 and since that time has had weakness, poor appetite, and ongoing nonbloody diarrhea.  He reports chronic lower left-sided abdominal pain.  During his last admission he underwent CT scan and enteritis was noted.  He does have a large ventral hernia related to his previous bypass.  ED Course: In the ED he was noted to be neutropenic with a WBC of 0.7, acute on chronic kidney injury with a creatinine of 1.46, and anion gap of 16, a low magnesium at 1.5, lactic acid of 3.2.  He was given an IV fluid bolus as well as cefepime and Flagyl for presumed neutropenic fever.  Urinalysis reveals moderate leuks and protein.   Reason for Visit: Neutropenia  Consultants: Medical oncology  Procedures: None  Antibiotics: Anti-infectives (From admission, onward)   Start     Dose/Rate Route Frequency Ordered Stop   01/16/20 0800  ceFEPIme (MAXIPIME) 2 g in sodium chloride 0.9 % 100 mL IVPB     Discontinue     2 g 200 mL/hr over 30 Minutes Intravenous Every 12 hours 01/15/20 2143     01/16/20 0400  metroNIDAZOLE (FLAGYL) IVPB 500 mg     Discontinue     500  mg 100 mL/hr over 60 Minutes Intravenous Every 8 hours 01/15/20 2125     01/16/20 0345  ceFEPIme (MAXIPIME) 2 g in sodium chloride 0.9 % 100 mL IVPB  Status:  Discontinued        2 g 200 mL/hr over 30 Minutes Intravenous  Once 01/15/20 2125 01/16/20 0100   01/15/20 1945  ceFEPIme (MAXIPIME) 2 g in sodium chloride 0.9 % 100 mL IVPB        2 g 200 mL/hr over 30 Minutes Intravenous  Once 01/15/20 1936 01/15/20 2123   01/15/20 1945  metroNIDAZOLE (FLAGYL) IVPB 500 mg        500 mg 100 mL/hr over 60 Minutes Intravenous  Once 01/15/20 1936 01/15/20 2123      Subjective/Interval History: Patient states that he continues to feel fatigued.  Complains of some left-sided abdominal pain.  Denies any nausea vomiting currently.  ROS: No chest pain or shortness of breath.    Assessment/Plan:  Acute diarrhea Likely secondary to chemotherapeutic agents.  CT scan showed findings suggestive of enteritis.  Diarrhea appears to be slowing down.  Continue to monitor.  C. difficile testing was negative.  GI pathogen panel is pending.  Clinically no signs of obstruction.  Neutropenia Unclear if the patient had fever at home or not.  Noted to have low-grade fevers here in the hospital.  WBC noted to be 0.4.  ANC is 200.  During his previous admission for similar reason he was given Granix.  We will start him on the  same.  Monitor counts daily.  Acute renal failure/hypokalemia/hypomagnesemia BUN was 37 along with a creatinine of 1.46 at admission.  Most likely due to volume loss from diarrhea.  IV fluids were initiated.  Renal function has improved.  Will replace potassium.  Magnesium was noted to be low as well and will be repleted.  Lactic acidosis Probably due to hypovolemia.  Noted to be on Metformin at home which could have contributed as well.  Improved.  Sepsis, present on admission/urinary tract infection Patient was tachycardic, tachypneic with neutropenia.  Cultures are pending.  Continue cefepime  and metronidazole for now.  Follow-up on cultures.  UA noted to be abnormal.  Continue current antibiotics for now.  Stage IIIa non-small cell lung cancer Followed by Dr. Julien Nordmann.  Receiving chemotherapy in the outpatient setting.  Chest x-ray did not show any acute findings.  Diabetes mellitus type 2 On Metformin at home which is held especially due to lactic acidosis.  Monitor CBGs.  SSI.  Normocytic anemia and thrombocytopenia Most likely due to chemotherapy.  Continue to monitor.  No evidence for overt blood loss.  Severe protein calorie malnutrition Previously has required TPN.  For now we will monitor his oral intake.  Stage IV COPD by Gold classification Respiratory status appears to be stable.  Continue inhalers.  Ongoing tobacco abuse Nicotine patch.  Counseled.  Essential hypertension Holding his antihypertensive medication including amlodipine and indapamide.  Blood pressure is reasonably well controlled.  Dyslipidemia Resume his home medications when more stable.  History of peripheral vascular disease Stable.  Incidental cholelithiasis Noted on CT scan.  Outpatient management.   DVT Prophylaxis: Lovenox Code Status: Full code Family Communication: Discussed with the patient Disposition Plan: Hopefully will be able to return home when improved.   Status is: Inpatient  Remains inpatient appropriate because:Persistent severe electrolyte disturbances and IV treatments appropriate due to intensity of illness or inability to take PO   Dispo: The patient is from: Home              Anticipated d/c is to: Home              Anticipated d/c date is: 2 days              Patient currently is not medically stable to d/c.     Medications:  Scheduled: . arformoterol  15 mcg Nebulization BID  . Chlorhexidine Gluconate Cloth  6 each Topical Daily  . enoxaparin (LOVENOX) injection  40 mg Subcutaneous Q24H  . insulin aspart  0-9 Units Subcutaneous TID WC  . nicotine   21 mg Transdermal Daily  . umeclidinium bromide  1 puff Inhalation Daily   Continuous: . sodium chloride 150 mL/hr at 01/16/20 0750  . ceFEPime (MAXIPIME) IV 2 g (01/16/20 0759)  . metronidazole 500 mg (01/16/20 0500)   GGY:IRSWNIOEV, fentaNYL (SUBLIMAZE) injection, metoprolol tartrate, ondansetron **OR** ondansetron (ZOFRAN) IV   Objective:  Vital Signs  Vitals:   01/16/20 0259 01/16/20 0642 01/16/20 0734 01/16/20 1022  BP: 124/76 114/78  119/80  Pulse: (!) 113 (!) 108  99  Resp: 20 20    Temp: 98.9 F (37.2 C) 99 F (37.2 C)  98.4 F (36.9 C)  TempSrc: Oral Oral  Oral  SpO2: 98% 97% 91% 98%  Weight:        Intake/Output Summary (Last 24 hours) at 01/16/2020 1131 Last data filed at 01/16/2020 0606 Gross per 24 hour  Intake 2500 ml  Output 576 ml  Net 1924  ml   Filed Weights   01/16/20 0052  Weight: 70.4 kg    General appearance: Awake alert.  In no distress Resp: Clear to auscultation bilaterally.  Normal effort Cardio: S1-S2 is normal regular.  No S3-S4.  No rubs murmurs or bruit GI: Abdomen is soft.  Mildly tender but no rebound rigidity or guarding.  No masses organomegaly.  Bowel sounds present.  Extremities: No edema.  Full range of motion of lower extremities. Neurologic:   No focal neurological deficits.    Lab Results:  Data Reviewed: I have personally reviewed following labs and imaging studies  CBC: Recent Labs  Lab 01/15/20 1704 01/16/20 0728  WBC 0.7* 0.4*  NEUTROABS 0.3* 0.2*  HGB 14.7 12.3*  HCT 44.1 36.1*  MCV 93.0 91.9  PLT 190 120*    Basic Metabolic Panel: Recent Labs  Lab 01/15/20 1704 01/15/20 1813 01/16/20 0728  NA 136  --  137  K 4.7  --  3.3*  CL 92*  --  101  CO2 28  --  26  GLUCOSE 219*  --  161*  BUN 37*  --  32*  CREATININE 1.46*  --  1.12  CALCIUM 9.9  --  8.0*  MG  --  1.5* 1.6*    GFR: Estimated Creatinine Clearance: 58.5 mL/min (by C-G formula based on SCr of 1.12 mg/dL).  Liver Function Tests: Recent  Labs  Lab 01/15/20 1704 01/16/20 0728  AST 21 16  ALT 25 18  ALKPHOS 68 45  BILITOT 1.2 0.7  PROT 6.0* 4.8*  ALBUMIN 3.2* 2.5*    Recent Labs  Lab 01/15/20 1704  LIPASE 22    CBG: Recent Labs  Lab 01/15/20 2138 01/16/20 0739  GLUCAP 110* 149*     Recent Results (from the past 240 hour(s))  Novel Coronavirus, NAA ([facility])     Status: None   Collection Time: 01/11/20 12:05 PM   Specimen: Oropharyngeal(OP) collection in vial transport medium   Oropharyngea  Testing  Result Value Ref Range Status   SARS-CoV-2, NAA Not Detected Not Detected Final    Comment: This nucleic acid amplification test was developed and its performance characteristics determined by [employer]. Nucleic acid amplification tests include RT-PCR and TMA. This test has not been FDA cleared or approved. This test has been authorized by FDA under an Emergency Use Authorization (EUA). This test is only authorized for the duration of time the declaration that circumstances exist justifying the authorization of the emergency use of in vitro diagnostic tests for detection of SARS-CoV-2 virus and/or diagnosis of COVID-19 infection under section 564(b)(1) of the Act, 21 U.S.C. 829FAO-1(H) (1), unless the authorization is terminated or revoked sooner. When diagnostic testing is negative, the possibility of a false negative result should be considered in the context of a patient's recent exposures and the presence of clinical signs and symptoms consistent with COVID-19. An individual without symptoms of COVID-19 and who is not shedding SARS-CoV-2 virus wo uld expect to have a negative (not detected) result in this assay.   SARS-COV-2, NAA 2 DAY TAT     Status: None   Collection Time: 01/11/20 12:05 PM   Oropharyngea  Testing  Result Value Ref Range Status   SARS-CoV-2, NAA 2 DAY TAT Performed  Final  C Difficile Quick Screen w PCR reflex     Status: None   Collection Time: 01/15/20  5:26 PM    Specimen: Stool  Result Value Ref Range Status   C Diff antigen  NEGATIVE NEGATIVE Final   C Diff toxin NEGATIVE NEGATIVE Final   C Diff interpretation No C. difficile detected.  Final    Comment: VALID Performed at [facility], [place] [address] [place], [place] [address]   Culture, blood (routine x 2)     Status: None (Preliminary result)   Collection Time: 01/15/20  6:13 PM   Specimen: BLOOD  Result Value Ref Range Status   Specimen Description   Final    BLOOD RIGHT ANTECUBITAL Performed at [facility] [address], [place], [place] [address]    Special Requests   Final    BOTTLES DRAWN AEROBIC AND ANAEROBIC Blood Culture adequate volume Performed at [facility] [address] [place], [place] [address]    Culture   Final    NO GROWTH < 24 HOURS Performed at [facility] [address], [place], [place] [address]    Report Status PENDING  Incomplete  Culture, blood (routine x 2)     Status: None (Preliminary result)   Collection Time: 01/15/20  6:13 PM   Specimen: BLOOD RIGHT FOREARM  Result Value Ref Range Status   Specimen Description   Final    BLOOD RIGHT FOREARM Performed at [facility], [place] [address] [place], [place] [address]    Special Requests   Final    BOTTLES DRAWN AEROBIC AND ANAEROBIC Blood Culture results may not be optimal due to an inadequate volume of blood received in culture bottles Performed at [facility] [address] [place], [place] [address]    Culture   Final    NO GROWTH < 24 HOURS Performed at [facility] [address] [place], [place] [address]    Report Status PENDING  Incomplete  SARS Coronavirus 2 by RT PCR (hospital order, performed in [facility] hospital lab) Nasopharyngeal Nasopharyngeal Swab     Status: None   Collection Time: 01/15/20  6:13 PM   Specimen: Nasopharyngeal Swab  Result Value Ref Range Status   SARS  Coronavirus 2 NEGATIVE NEGATIVE Final    Comment: (NOTE) SARS-CoV-2 target nucleic acids are NOT DETECTED.  The SARS-CoV-2 RNA is generally detectable in upper and lower respiratory specimens during the acute phase of infection. The lowest concentration of SARS-CoV-2 viral copies this assay can detect is 250 copies / mL. A negative result does not preclude SARS-CoV-2 infection and should not be used as the sole basis for treatment or other patient management decisions.  A negative result may occur with improper specimen collection / handling, submission of specimen other than nasopharyngeal swab, presence of viral mutation(s) within the areas targeted by this assay, and inadequate number of viral copies (<250 copies / mL). A negative result must be combined with clinical observations, patient history, and epidemiological information.  Fact Sheet for Patients:   StrictlyIdeas.no  Fact Sheet for Healthcare Providers: BankingDealers.co.za  This test is not yet approved or  cleared by the [place] FDA and has been authorized for detection and/or diagnosis of SARS-CoV-2 by FDA under an Emergency Use Authorization (EUA).  This EUA will remain in effect (meaning this test can be used) for the duration of the COVID-19 declaration under Section 564(b)(1) of the Act, 21 U.S.C. section 360bbb-3(b)(1), unless the authorization is terminated or revoked sooner.  Performed at [facility], [place] [address], [place], [place] [address]       Radiology Studies: DG Chest 2 View  Result Date: 01/15/2020 CLINICAL DATA:  Dyspnea EXAM: CHEST - 2 VIEW COMPARISON:  01/01/2020 FINDINGS: Previously noted PICC line has been removed. The lungs are mildly hyperinflated, in keeping with changes of underlying COPD, unchanged from prior examination. No superimposed focal pulmonary infiltrate. Stable nodule within the left lung base laterally,  indeterminate. Surgical staple line noted at the left hilum. No pneumothorax or pleural effusion. Cardiac size within normal limits. No acute bone abnormality. IMPRESSION: No active cardiopulmonary disease. COPD. Stable nodule at the left lung base laterally. This could be better assessed with CT examination once the patient's acute issues have resolved. Electronically Signed   By: Fidela Salisbury MD   On: 01/15/2020 17:52   CT ABDOMEN PELVIS W CONTRAST  Result Date: 01/15/2020 CLINICAL DATA:  74 year old male with abdominal pain. EXAM: CT ABDOMEN AND PELVIS WITH CONTRAST TECHNIQUE: Multidetector CT imaging of the abdomen and pelvis was performed using the standard protocol following bolus administration of intravenous contrast. CONTRAST:  163mL OMNIPAQUE IOHEXOL 300 MG/ML  SOLN COMPARISON:  CT abdomen pelvis dated 12/26/2019. FINDINGS: Lower chest: The visualized lung bases are clear. No intra-abdominal free air or free fluid. Hepatobiliary: Subcentimeter hypodense lesion in the right lobe of the liver inferiorly with a small internal enhancing focus similar to prior CT is not characterized. MRI may provide better characterization if clinically indicated. There is mild intrahepatic biliary ductal dilatation. There are multiple stones within the gallbladder. There is mild haziness of the gallbladder wall with trace pericholecystic fluid. Ultrasound may provide better evaluation of the gallbladder if there is clinical concern for acute cholecystitis. Pancreas: Unremarkable. No pancreatic ductal dilatation or surrounding inflammatory changes. Spleen: Normal in size without focal abnormality. Adrenals/Urinary Tract: The right adrenal gland is unremarkable. There is a 1 cm indeterminate left adrenal nodule. There is severe right renal parenchyma atrophy and cortical thinning. There is no hydronephrosis on either side. There is symmetric enhancement and excretion of contrast by both kidneys. Several subcentimeter  bilateral renal hypodense lesions are too small to characterize. The visualized ureters appear unremarkable. The urinary bladder is decompressed around a Foley catheter. Apparent soft tissue density at the base of the bladder likely related to enlarged prostate gland with median lobe hypertrophy. Stomach/Bowel: There is diffuse long segment thickening and inflammatory changes of the small bowel loops throughout the abdomen consistent with enteritis. Overall the degree of inflammation is similar to prior CT. Multiple ventral hernias containing several loops of small bowel as seen on the prior CT. There is abutment of these loops of bowel to the anterior peritoneal wall compatible with adhesions. No definite evidence of bowel obstruction at this time. The appendix is unremarkable. Vascular/Lymphatic: There is advanced aortoiliac atherosclerotic disease. There is occlusion of the distal abdominal aorta and an aortobifemoral bypass graft. The bypass graft is patent. The SMV, splenic vein, and main portal vein are patent. No portal venous gas. There is no adenopathy. Reproductive: Enlarged prostate gland measuring approximately 5 cm in transverse axial diameter. There is median lobe hypertrophy. Other: None Musculoskeletal: Bilateral L5 pars defects. No acute osseous pathology. IMPRESSION: 1. Diffuse thickening and inflammatory changes of normal caliber bowel loops may represent enteritis. Bowel ischemia is not entirely excluded. Clinical correlation is recommended. There is however no pneumatosis, free air, or portal venous gas. No definite evidence of obstruction. 2. Multiple ventral hernia is similar to prior CT containing segments of small bowel with findings of adhesions to the anterior peritoneal wall. 3. Cholelithiasis with mild haziness of the gallbladder wall.  Ultrasound may provide better evaluation of the gallbladder if there is clinical concern for acute cholecystitis. 4. Occlusion of the distal abdominal  aorta and an aortobifemoral bypass graft. The bypass graft is patent. 5. Aortic Atherosclerosis (ICD10-I70.0). Electronically Signed   By: Anner Crete M.D.   On: 01/15/2020 22:53       LOS: 1 day   [facility] Pager on [email or web address]  01/16/2020, 11:31 AM

## 2020-01-16 NOTE — Progress Notes (Signed)
Initial Nutrition Assessment  DOCUMENTATION CODES:   Severe malnutrition in context of chronic illness  INTERVENTION:   Once diet advanced: -[employer] BID, each provides 455 kcal and 20 grams protein.   NUTRITION DIAGNOSIS:   Severe Malnutrition related to chronic illness, cancer and cancer related treatments as evidenced by moderate fat depletion, severe muscle depletion, percent weight loss.  GOAL:   Patient will meet greater than or equal to 90% of their needs  MONITOR:   Diet advancement, Labs, Weight trends, I & O's  REASON FOR ASSESSMENT:   Malnutrition Screening Tool    ASSESSMENT:   74 y.o. male with medical history significant of type 2 diabetes, hypertension, hyperlipidemia, COPD, chronic kidney disease, BPH, chronic indwelling Foley, peripheral vascular disease status post bypass 1 year ago, ongoing tobacco use, stage IIIa non-small cell cancer of the lung status post robotic assisted lobectomy on Oct 28, 2019 undergoing chemotherapy.  Following his first round of chemotherapy he was admitted with enteritis thought to be related to his chemo, poor p.o. intake, lactic acidosis related to dehydration, and required TPN, antibiotics, NG tube placement.  He was admitted for approximately 1 week for rehydration and electrolyte repletion.  He had a second chemotherapy on 01/09/2020 and since that time has had weakness, poor appetite, and ongoing nonbloody diarrhea.  He reports chronic lower left-sided abdominal pain.  Patient currently on clear liquids, just advanced. Has continued to have poor appetite since last admission earlier this month. Pt had chemotherapy on 7/19 and developed some diarrhea. During previous admission pt was started on TPN but this was discontinued prior to discharge 7/12.  Will monitor intakes. Will order supplements once tolerating clears.  Per weight records, pt has lost 26 lbs since 5/7 (14% wt loss x 2.5 months, significant for time frame). Severe  malnutrition continues given continued difficulties with diarrhea and poor appetite.  Medications: KLOR-CON, IV Mg sulfate, IV Zofran Labs reviewed: CBGs: 118-149 Low K, Mg  NUTRITION - FOCUSED PHYSICAL EXAM:    Most Recent Value  Orbital Region Moderate depletion  Upper Arm Region Moderate depletion  Thoracic and Lumbar Region Unable to assess  Buccal Region Moderate depletion  Temple Region Moderate depletion  Clavicle Bone Region Severe depletion  Clavicle and Acromion Bone Region Severe depletion  Scapular Bone Region Unable to assess  Dorsal Hand Mild depletion  Patellar Region Unable to assess  Anterior Thigh Region Unable to assess  Posterior Calf Region Unable to assess  Edema (RD Assessment) None       Diet Order:   Diet Order            Diet clear liquid Room service appropriate? Yes; Fluid consistency: Thin  Diet effective now                 EDUCATION NEEDS:   No education needs have been identified at this time  Skin:  Skin Assessment: Reviewed RN Assessment  Last BM:  7/26  Height:   Ht Readings from Last 1 Encounters:  12/26/19 5\' 10"  (1.778 m)    Weight:   Wt Readings from Last 1 Encounters:  01/16/20 70.4 kg    BMI:  Body mass index is 22.27 kg/m.  Estimated Nutritional Needs:   Kcal:  2100-2300  Protein:  110-120g  Fluid:  2L/day   Clayton Bibles, MS, RD, LDN Inpatient Clinical Dietitian Contact information available via Amion

## 2020-01-16 NOTE — Consult Note (Signed)
   [facility] Inpatient Consult   01/16/2020  Chad Gomez 09/16/1945 [id number]   Patient is currently active with [facility] for chronic disease management services.  Patient has been engaged by a [facility].   Notified community RN of patient admission and will continue to follow for progression and disposition plans. Of note, [facility] Care Management services does not replace or interfere with any services that are arranged by inpatient case management or social work.  Netta Cedars, MSN, [facility] Liaison Nurse Mobile Phone [phone]  Toll free office [phone]

## 2020-01-17 ENCOUNTER — Encounter: Payer: Self-pay | Admitting: *Deleted

## 2020-01-17 DIAGNOSIS — E876 Hypokalemia: Secondary | ICD-10-CM | POA: Diagnosis not present

## 2020-01-17 DIAGNOSIS — C3492 Malignant neoplasm of unspecified part of left bronchus or lung: Secondary | ICD-10-CM

## 2020-01-17 DIAGNOSIS — N179 Acute kidney failure, unspecified: Secondary | ICD-10-CM | POA: Diagnosis not present

## 2020-01-17 DIAGNOSIS — N390 Urinary tract infection, site not specified: Secondary | ICD-10-CM

## 2020-01-17 DIAGNOSIS — R197 Diarrhea, unspecified: Secondary | ICD-10-CM | POA: Diagnosis not present

## 2020-01-17 LAB — GLUCOSE, CAPILLARY
Glucose-Capillary: 101 mg/dL — ABNORMAL HIGH (ref 70–99)
Glucose-Capillary: 122 mg/dL — ABNORMAL HIGH (ref 70–99)
Glucose-Capillary: 156 mg/dL — ABNORMAL HIGH (ref 70–99)
Glucose-Capillary: 120 mg/dL — ABNORMAL HIGH (ref 70–99)

## 2020-01-17 LAB — MAGNESIUM: Magnesium: 2.5 mg/dL — ABNORMAL HIGH (ref 1.7–2.4)

## 2020-01-17 LAB — CBC WITH DIFFERENTIAL/PLATELET
Abs Immature Granulocytes: 0 10*3/uL (ref 0.00–0.07)
Basophils Absolute: 0 10*3/uL (ref 0.0–0.1)
Basophils Relative: 0 %
Eosinophils Absolute: 0 10*3/uL (ref 0.0–0.5)
Eosinophils Relative: 2 %
HCT: 33.6 % — ABNORMAL LOW (ref 39.0–52.0)
Hemoglobin: 11.1 g/dL — ABNORMAL LOW (ref 13.0–17.0)
Lymphocytes Relative: 27 %
Lymphs Abs: 0.2 10*3/uL — ABNORMAL LOW (ref 0.7–4.0)
MCH: 30.7 pg (ref 26.0–34.0)
MCHC: 33 g/dL (ref 30.0–36.0)
MCV: 93.1 fL (ref 80.0–100.0)
Monocytes Absolute: 0.1 10*3/uL (ref 0.1–1.0)
Monocytes Relative: 13 %
Myelocytes: 3 %
Neutro Abs: 0.4 10*3/uL — ABNORMAL LOW (ref 1.7–7.7)
Neutrophils Relative %: 55 %
Platelets: 78 10*3/uL — ABNORMAL LOW (ref 150–400)
RBC: 3.61 MIL/uL — ABNORMAL LOW (ref 4.22–5.81)
RDW: 13.2 % (ref 11.5–15.5)
WBC: 0.8 10*3/uL — CL (ref 4.0–10.5)
nRBC: 0 % (ref 0.0–0.2)

## 2020-01-17 LAB — COMPREHENSIVE METABOLIC PANEL
ALT: 17 U/L (ref 0–44)
AST: 15 U/L (ref 15–41)
Albumin: 2.2 g/dL — ABNORMAL LOW (ref 3.5–5.0)
Alkaline Phosphatase: 43 U/L (ref 38–126)
Anion gap: 9 (ref 5–15)
BUN: 31 mg/dL — ABNORMAL HIGH (ref 8–23)
CO2: 23 mmol/L (ref 22–32)
Calcium: 7.8 mg/dL — ABNORMAL LOW (ref 8.9–10.3)
Chloride: 106 mmol/L (ref 98–111)
Creatinine, Ser: 0.99 mg/dL (ref 0.61–1.24)
GFR calc Af Amer: >60 mL/min (ref >60)
GFR calc non Af Amer: >60 mL/min (ref >60)
Glucose, Bld: 114 mg/dL — ABNORMAL HIGH (ref 70–99)
Potassium: 3.1 mmol/L — ABNORMAL LOW (ref 3.5–5.1)
Sodium: 138 mmol/L (ref 135–145)
Total Bilirubin: 0.7 mg/dL (ref 0.3–1.2)
Total Protein: 4.6 g/dL — ABNORMAL LOW (ref 6.5–8.1)

## 2020-01-17 MED ORDER — SODIUM CHLORIDE 0.9 % IV SOLN
2.0000 g | Freq: Three times a day (TID) | INTRAVENOUS | Status: DC
  Administered 2020-01-17 – 2020-01-20 (×9): 2 g via INTRAVENOUS
  Filled 2020-01-17 (×11): qty 2

## 2020-01-17 MED ORDER — TRAMADOL HCL 50 MG PO TABS
50.0000 mg | ORAL_TABLET | Freq: Four times a day (QID) | ORAL | Status: DC | PRN
  Administered 2020-01-17 – 2020-01-20 (×10): 50 mg via ORAL
  Filled 2020-01-17 (×10): qty 1

## 2020-01-17 MED ORDER — FLUCONAZOLE 100 MG PO TABS
200.0000 mg | ORAL_TABLET | Freq: Every day | ORAL | Status: DC
  Administered 2020-01-17 – 2020-01-20 (×4): 200 mg via ORAL
  Filled 2020-01-17 (×4): qty 2

## 2020-01-17 MED ORDER — KATE FARMS STANDARD 1.4 PO LIQD
325.0000 mL | Freq: Two times a day (BID) | ORAL | Status: DC
  Administered 2020-01-17 – 2020-01-18 (×2): 325 mL via ORAL
  Filled 2020-01-17 (×7): qty 325

## 2020-01-17 MED ORDER — POTASSIUM CHLORIDE CRYS ER 20 MEQ PO TBCR
40.0000 meq | EXTENDED_RELEASE_TABLET | ORAL | Status: AC
  Administered 2020-01-17 (×2): 40 meq via ORAL
  Filled 2020-01-17 (×2): qty 2

## 2020-01-17 MED ORDER — TAMSULOSIN HCL 0.4 MG PO CAPS
0.4000 mg | ORAL_CAPSULE | Freq: Every day | ORAL | Status: DC
  Administered 2020-01-17 – 2020-01-19 (×3): 0.4 mg via ORAL
  Filled 2020-01-17 (×3): qty 1

## 2020-01-17 NOTE — Progress Notes (Addendum)
[facility] PROGRESS NOTE   Chad Gomez SHF:[id number] DOB: 09-11-1945 DOA: 01/15/2020  PCP: [name], MD  Brief History/Interval Summary: 74 y.o. male with medical history significant of type 2 diabetes, hypertension, hyperlipidemia, COPD, chronic kidney disease, BPH, chronic indwelling Foley, peripheral vascular disease status post bypass 1 year ago, ongoing tobacco use, stage IIIa non-small cell cancer of the lung status post robotic assisted lobectomy on Oct 28, 2019 undergoing chemotherapy.  Following his first round of chemotherapy he was admitted with enteritis thought to be related to his chemo, poor p.o. intake, lactic acidosis related to dehydration, and required TPN, antibiotics, NG tube placement.  He was admitted for approximately 1 week for rehydration and electrolyte repletion.  He had a second chemotherapy on 01/09/2020 and since that time has had weakness, poor appetite, and ongoing nonbloody diarrhea.  He reports chronic lower left-sided abdominal pain.  During his last admission he underwent CT scan and enteritis was noted.  He does have a large ventral hernia related to his previous bypass.  ED Course: In the ED he was noted to be neutropenic with a WBC of 0.7, acute on chronic kidney injury with a creatinine of 1.46, and anion gap of 16, a low magnesium at 1.5, lactic acid of 3.2.  He was given an IV fluid bolus as well as cefepime and Flagyl for presumed neutropenic fever.  Urinalysis reveals moderate leuks and protein.   Reason for Visit: Neutropenia  Consultants: Medical oncology  Procedures: None  Antibiotics: Anti-infectives (From admission, onward)   Start     Dose/Rate Route Frequency Ordered Stop   01/17/20 1400  ceFEPIme (MAXIPIME) 2 g in sodium chloride 0.9 % 100 mL IVPB     Discontinue     2 g 200 mL/hr over 30 Minutes Intravenous Every 8 hours 01/17/20 1023     01/16/20 0800  ceFEPIme (MAXIPIME) 2 g in sodium chloride 0.9 % 100 mL IVPB   Status:  Discontinued        2 g 200 mL/hr over 30 Minutes Intravenous Every 12 hours 01/15/20 2143 01/17/20 1023   01/16/20 0400  metroNIDAZOLE (FLAGYL) IVPB 500 mg     Discontinue     500 mg 100 mL/hr over 60 Minutes Intravenous Every 8 hours 01/15/20 2125     01/16/20 0345  ceFEPIme (MAXIPIME) 2 g in sodium chloride 0.9 % 100 mL IVPB  Status:  Discontinued        2 g 200 mL/hr over 30 Minutes Intravenous  Once 01/15/20 2125 01/16/20 0100   01/15/20 1945  ceFEPIme (MAXIPIME) 2 g in sodium chloride 0.9 % 100 mL IVPB        2 g 200 mL/hr over 30 Minutes Intravenous  Once 01/15/20 1936 01/15/20 2123   01/15/20 1945  metroNIDAZOLE (FLAGYL) IVPB 500 mg        500 mg 100 mL/hr over 60 Minutes Intravenous  Once 01/15/20 1936 01/15/20 2123      Subjective/Interval History: Patient states that he is feeling slightly better this morning.  Requesting an advance in his diet.  Has not had any further nausea or vomiting since yesterday morning.  Denies any abdominal pain.  No chest pain or shortness of breath.  Diarrhea appears to be subsiding.  He did have 2 loose stools this morning.     Assessment/Plan:  Acute diarrhea Likely secondary to chemotherapeutic agents.  CT scan showed findings suggestive of enteritis.  C. difficile testing was negative.  GI pathogen  panel is pending.  Clinically no signs of obstruction.  Diarrhea seems to be subsiding.  Continue to monitor.  He does have some tenderness in the abdomen which is from his chronic ventral hernia.  Neutropenia Unclear if the patient had fever at home or not.  Noted to have low-grade fevers here in the hospital.  With [place] of 200.  Started on Granix.  ANC is 400 today.  Continue for now.  Monitor counts daily.    Acute renal failure/hypokalemia/hypomagnesemia BUN was 37 along with a creatinine of 1.46 at admission.  Most likely due to volume loss from diarrhea.  IV fluids were initiated.  Renal function continues to improve.  Creatinine  is down to 0.99.  Low potassium is due to GI loss.  This will be repleted.  Magnesium improved to 2.5 today.    Lactic acidosis Probably due to hypovolemia.  Noted to be on Metformin at home which could have contributed as well.  Improved.  Sepsis, present on admission/urinary tract infection Patient was tachycardic, tachypneic with neutropenia.  Cultures are pending.  Continue cefepime and metronidazole for now.  Cultures are pending.  No growth so far.  Urine culture with 80,000 colonies of yeast.  Patient has a chronic indwelling Foley.  Neutropenic.  I will start him on fluconazole.    Stage IIIa non-small cell lung cancer Followed by Dr. Julien Nordmann.  Receiving chemotherapy in the outpatient setting.  Chest x-ray did not show any acute findings.  Diabetes mellitus type 2 On Metformin at home which is held especially due to lactic acidosis.  Monitor CBGs.  SSI.  Normocytic anemia and thrombocytopenia Most likely due to chemotherapy.  Hemoglobin slightly lower today but stable for the most part.  Platelet count is also lower today.  Stop heparin products.  Severe protein calorie malnutrition Previously has required TPN.  For now we will monitor his oral intake.  Advance his diet.  Stage IV COPD by Gold classification Respiratory status appears to be stable.  Continue inhalers.  Ongoing tobacco abuse Nicotine patch.  Counseled.  Essential hypertension Holding his antihypertensive medication including amlodipine and indapamide.  Blood pressure is reasonably well controlled.  Dyslipidemia Resume his home medications when more stable.  History of peripheral vascular disease Stable.  Incidental cholelithiasis Noted on CT scan.  Outpatient management.  Chronic indwelling Foley Continue routine Foley care.   DVT Prophylaxis: Lovenox will be stopped due to low platelets today.  SCDs. Code Status: Full code Family Communication: Discussed with the patient Disposition Plan:  Hopefully will be able to return home when improved.  PT and OT evaluation.  Status is: Inpatient  Remains inpatient appropriate because:IV treatments appropriate due to intensity of illness or inability to take PO and Inpatient level of care appropriate due to severity of illness   Dispo:  Patient From: Home  Planned Disposition: Home  Expected discharge date: 01/19/20  Medically stable for discharge: No     Medications:  Scheduled: . arformoterol  15 mcg Nebulization BID  . Chlorhexidine Gluconate Cloth  6 each Topical Daily  . enoxaparin (LOVENOX) injection  40 mg Subcutaneous Q24H  . insulin aspart  0-9 Units Subcutaneous TID WC  . nicotine  21 mg Transdermal Daily  . potassium chloride  40 mEq Oral Q4H  . tamsulosin  0.4 mg Oral QPC supper  . Tbo-filgastrim (GRANIX) SQ  480 mcg Subcutaneous q1800  . umeclidinium bromide  1 puff Inhalation Daily   Continuous: . sodium chloride 100 mL/hr at 01/17/20  1053  . ceFEPime (MAXIPIME) IV    . metronidazole 500 mg (01/17/20 0429)   FOY:DXAJOINOM, fentaNYL (SUBLIMAZE) injection, metoprolol tartrate, ondansetron **OR** ondansetron (ZOFRAN) IV, prochlorperazine, traMADol   Objective:  Vital Signs  Vitals:   01/16/20 2212 01/16/20 2249 01/17/20 0700 01/17/20 0744  BP:  118/78 (!) 129/76   Pulse:  83 90   Resp:  17 18   Temp:  97.7 F (36.5 C) 98.2 F (36.8 C)   TempSrc:  Oral Oral   SpO2: 97% 96% 96% 95%  Weight:        Intake/Output Summary (Last 24 hours) at 01/17/2020 1127 Last data filed at 01/17/2020 1010 Gross per 24 hour  Intake 1722.96 ml  Output 1150 ml  Net 572.96 ml   Filed Weights   01/16/20 0052  Weight: 70.4 kg    General appearance: Awake alert.  In no distress Resp: Clear to auscultation bilaterally.  Normal effort Cardio: S1-S2 is normal regular.  No S3-S4.  No rubs murmurs or bruit GI: Abdomen is soft.  Mildly tender in the left side.  Ventral hernia is appreciated.  Chronic per patient.  No  masses organomegaly.   Extremities: No edema.  Moving all his extremities. Neurologic:  No focal neurological deficits.    Lab Results:  Data Reviewed: I have personally reviewed following labs and imaging studies  CBC: Recent Labs  Lab 01/15/20 1704 01/16/20 0728 01/17/20 0554  WBC 0.7* 0.4* 0.8*  NEUTROABS 0.3* 0.2* 0.4*  HGB 14.7 12.3* 11.1*  HCT 44.1 36.1* 33.6*  MCV 93.0 91.9 93.1  PLT 190 120* 78*    Basic Metabolic Panel: Recent Labs  Lab 01/15/20 1704 01/15/20 1813 01/16/20 0728 01/17/20 0554  NA 136  --  137 138  K 4.7  --  3.3* 3.1*  CL 92*  --  101 106  CO2 28  --  26 23  GLUCOSE 219*  --  161* 114*  BUN 37*  --  32* 31*  CREATININE 1.46*  --  1.12 0.99  CALCIUM 9.9  --  8.0* 7.8*  MG  --  1.5* 1.6* 2.5*    GFR: Estimated Creatinine Clearance: 66.2 mL/min (by C-G formula based on SCr of 0.99 mg/dL).  Liver Function Tests: Recent Labs  Lab 01/15/20 1704 01/16/20 0728 01/17/20 0554  AST 21 16 15   ALT 25 18 17   ALKPHOS 68 45 43  BILITOT 1.2 0.7 0.7  PROT 6.0* 4.8* 4.6*  ALBUMIN 3.2* 2.5* 2.2*    Recent Labs  Lab 01/15/20 1704  LIPASE 22    CBG: Recent Labs  Lab 01/16/20 0739 01/16/20 1137 01/16/20 1715 01/16/20 2247 01/17/20 0758  GLUCAP 149* 118* 116* 122* 101*     Recent Results (from the past 240 hour(s))  Novel Coronavirus, NAA ([facility])     Status: None   Collection Time: 01/11/20 12:05 PM   Specimen: Oropharyngeal(OP) collection in vial transport medium   Oropharyngea  Testing  Result Value Ref Range Status   SARS-CoV-2, NAA Not Detected Not Detected Final    Comment: This nucleic acid amplification test was developed and its performance characteristics determined by [employer]. Nucleic acid amplification tests include RT-PCR and TMA. This test has not been FDA cleared or approved. This test has been authorized by FDA under an Emergency Use Authorization (EUA). This test is only authorized for the duration  of time the declaration that circumstances exist justifying the authorization of the emergency use of in vitro diagnostic tests for  detection of SARS-CoV-2 virus and/or diagnosis of COVID-19 infection under section 564(b)(1) of the Act, 21 U.S.C. 016WFU-9(N) (1), unless the authorization is terminated or revoked sooner. When diagnostic testing is negative, the possibility of a false negative result should be considered in the context of a patient's recent exposures and the presence of clinical signs and symptoms consistent with COVID-19. An individual without symptoms of COVID-19 and who is not shedding SARS-CoV-2 virus wo uld expect to have a negative (not detected) result in this assay.   SARS-COV-2, NAA 2 DAY TAT     Status: None   Collection Time: 01/11/20 12:05 PM   Oropharyngea  Testing  Result Value Ref Range Status   SARS-CoV-2, NAA 2 DAY TAT Performed  Final  C Difficile Quick Screen w PCR reflex     Status: None   Collection Time: 01/15/20  5:26 PM   Specimen: Stool  Result Value Ref Range Status   C Diff antigen NEGATIVE NEGATIVE Final   C Diff toxin NEGATIVE NEGATIVE Final   C Diff interpretation No C. difficile detected.  Final    Comment: VALID Performed at [facility], [place] [address], [place], [place] [address]   Culture, blood (routine x 2)     Status: None (Preliminary result)   Collection Time: 01/15/20  6:13 PM   Specimen: BLOOD  Result Value Ref Range Status   Specimen Description   Final    BLOOD RIGHT ANTECUBITAL Performed at [place] [address], [place], [place] [address]    Special Requests   Final    BOTTLES DRAWN AEROBIC AND ANAEROBIC Blood Culture adequate volume Performed at [place] [address], [place], [place] [address]    Culture   Final    NO GROWTH < 24 HOURS Performed at [place] [address], [place], [place] [address]    Report Status PENDING   Incomplete  Culture, blood (routine x 2)     Status: None (Preliminary result)   Collection Time: 01/15/20  6:13 PM   Specimen: BLOOD RIGHT FOREARM  Result Value Ref Range Status   Specimen Description   Final    BLOOD RIGHT FOREARM Performed at [facility], [place] [address], [place], [place] [address]    Special Requests   Final    BOTTLES DRAWN AEROBIC AND ANAEROBIC Blood Culture results may not be optimal due to an inadequate volume of blood received in culture bottles Performed at [place] [address], [place], [place] [address]    Culture   Final    NO GROWTH < 24 HOURS Performed at [place] [address] [place], [place] [address]    Report Status PENDING  Incomplete  Urine culture     Status: Abnormal   Collection Time: 01/15/20  6:13 PM   Specimen: Urine, Catheterized  Result Value Ref Range Status   Specimen Description   Final    URINE, CATHETERIZED Performed at [facility] [address], [place], [place] [address]    Special Requests   Final    Immunocompromised Performed at [facility], [place] [address], [place], [place] [address]    Culture 80,000 COLONIES/mL YEAST (A)  Final   Report Status 01/16/2020 FINAL  Final  SARS Coronavirus 2 by RT PCR (hospital order, performed in [facility] hospital lab) Nasopharyngeal Nasopharyngeal Swab     Status: None   Collection  Time: 01/15/20  6:13 PM   Specimen: Nasopharyngeal Swab  Result Value Ref Range Status   SARS Coronavirus 2 NEGATIVE NEGATIVE Final    Comment: (NOTE) SARS-CoV-2 target nucleic acids are NOT DETECTED.  The SARS-CoV-2 RNA is generally detectable in upper and lower respiratory specimens during the acute phase of infection. The lowest concentration of SARS-CoV-2 viral copies this assay can detect is 250 copies / mL. A negative result does not preclude SARS-CoV-2 infection and should not be used as the sole basis for  treatment or other patient management decisions.  A negative result may occur with improper specimen collection / handling, submission of specimen other than nasopharyngeal swab, presence of viral mutation(s) within the areas targeted by this assay, and inadequate number of viral copies (<250 copies / mL). A negative result must be combined with clinical observations, patient history, and epidemiological information.  Fact Sheet for Patients:   StrictlyIdeas.no  Fact Sheet for Healthcare Providers: BankingDealers.co.za  This test is not yet approved or  cleared by the [place] FDA and has been authorized for detection and/or diagnosis of SARS-CoV-2 by FDA under an Emergency Use Authorization (EUA).  This EUA will remain in effect (meaning this test can be used) for the duration of the COVID-19 declaration under Section 564(b)(1) of the Act, 21 U.S.C. section 360bbb-3(b)(1), unless the authorization is terminated or revoked sooner.  Performed at [facility], [place] [address], [place] [address]       Radiology Studies: DG Chest 2 View  Result Date: 01/15/2020 CLINICAL DATA:  Dyspnea EXAM: CHEST - 2 VIEW COMPARISON:  01/01/2020 FINDINGS: Previously noted PICC line has been removed. The lungs are mildly hyperinflated, in keeping with changes of underlying COPD, unchanged from prior examination. No superimposed focal pulmonary infiltrate. Stable nodule within the left lung base laterally, indeterminate. Surgical staple line noted at the left hilum. No pneumothorax or pleural effusion. Cardiac size within normal limits. No acute bone abnormality. IMPRESSION: No active cardiopulmonary disease. COPD. Stable nodule at the left lung base laterally. This could be better assessed with CT examination once the patient's acute issues have resolved. Electronically Signed   By: Fidela Salisbury MD   On: 01/15/2020 17:52   CT  ABDOMEN PELVIS W CONTRAST  Result Date: 01/15/2020 CLINICAL DATA:  74 year old male with abdominal pain. EXAM: CT ABDOMEN AND PELVIS WITH CONTRAST TECHNIQUE: Multidetector CT imaging of the abdomen and pelvis was performed using the standard protocol following bolus administration of intravenous contrast. CONTRAST:  126mL OMNIPAQUE IOHEXOL 300 MG/ML  SOLN COMPARISON:  CT abdomen pelvis dated 12/26/2019. FINDINGS: Lower chest: The visualized lung bases are clear. No intra-abdominal free air or free fluid. Hepatobiliary: Subcentimeter hypodense lesion in the right lobe of the liver inferiorly with a small internal enhancing focus similar to prior CT is not characterized. MRI may provide better characterization if clinically indicated. There is mild intrahepatic biliary ductal dilatation. There are multiple stones within the gallbladder. There is mild haziness of the gallbladder wall with trace pericholecystic fluid. Ultrasound may provide better evaluation of the gallbladder if there is clinical concern for acute cholecystitis. Pancreas: Unremarkable. No pancreatic ductal dilatation or surrounding inflammatory changes. Spleen: Normal in size without focal abnormality. Adrenals/Urinary Tract: The right adrenal gland is unremarkable. There is a 1 cm indeterminate left adrenal nodule. There is severe right renal parenchyma atrophy and cortical thinning. There is no hydronephrosis on either side. There is symmetric enhancement and excretion of contrast by both kidneys. Several subcentimeter  bilateral renal hypodense lesions are too small to characterize. The visualized ureters appear unremarkable. The urinary bladder is decompressed around a Foley catheter. Apparent soft tissue density at the base of the bladder likely related to enlarged prostate gland with median lobe hypertrophy. Stomach/Bowel: There is diffuse long segment thickening and inflammatory changes of the small bowel loops throughout the abdomen consistent  with enteritis. Overall the degree of inflammation is similar to prior CT. Multiple ventral hernias containing several loops of small bowel as seen on the prior CT. There is abutment of these loops of bowel to the anterior peritoneal wall compatible with adhesions. No definite evidence of bowel obstruction at this time. The appendix is unremarkable. Vascular/Lymphatic: There is advanced aortoiliac atherosclerotic disease. There is occlusion of the distal abdominal aorta and an aortobifemoral bypass graft. The bypass graft is patent. The SMV, splenic vein, and main portal vein are patent. No portal venous gas. There is no adenopathy. Reproductive: Enlarged prostate gland measuring approximately 5 cm in transverse axial diameter. There is median lobe hypertrophy. Other: None Musculoskeletal: Bilateral L5 pars defects. No acute osseous pathology. IMPRESSION: 1. Diffuse thickening and inflammatory changes of normal caliber bowel loops may represent enteritis. Bowel ischemia is not entirely excluded. Clinical correlation is recommended. There is however no pneumatosis, free air, or portal venous gas. No definite evidence of obstruction. 2. Multiple ventral hernia is similar to prior CT containing segments of small bowel with findings of adhesions to the anterior peritoneal wall. 3. Cholelithiasis with mild haziness of the gallbladder wall. Ultrasound may provide better evaluation of the gallbladder if there is clinical concern for acute cholecystitis. 4. Occlusion of the distal abdominal aorta and an aortobifemoral bypass graft. The bypass graft is patent. 5. Aortic Atherosclerosis (ICD10-I70.0). Electronically Signed   By: Anner Crete M.D.   On: 01/15/2020 22:53       LOS: 2 days   [facility] Pager on [email or web address]  01/17/2020, 11:27 AM

## 2020-01-17 NOTE — Progress Notes (Signed)
HEMATOLOGY-ONCOLOGY PROGRESS NOTE  SUBJECTIVE: The patient presented to the emergency room with generalized weakness.  In the ER, he was noted to be neutropenic with a WBC of 0.7 and had acute on chronic kidney injury with a creatinine of 1.46.  Lactic acid was 3.2.  He was started on IV fluids as well as IV antibiotics.  He was also started on Granix this admission.  No fevers documented over the past 24 hours.  Reports ongoing diarrhea this morning.  C. difficile was negative.  GI panel pending.  Not currently having any nausea or vomiting.  Blood cultures to date are negative.  Oncology History  Lung cancer, lingula ([place])  11/01/2019 Initial Diagnosis   Lung cancer, lingula ([place])   11/01/2019 Cancer Staging   Staging form: Lung, AJCC 8th Edition - Pathologic stage from 11/01/2019: Stage IIIA (pT2a, pN2, cM0) - Signed by [name], MD on 11/01/2019   Adenocarcinoma, lung, left ([place])  12/16/2019 Initial Diagnosis   Adenocarcinoma, lung, left ([place])   12/19/2019 -  Chemotherapy   The patient had dexamethasone (DECADRON) 4 MG tablet, 1 of 1 cycle, Start date: --, End date: -- palonosetron (ALOXI) injection 0.25 mg, 0.25 mg, Intravenous,  Once, 2 of 4 cycles Administration: 0.25 mg (12/19/2019), 0.25 mg (01/09/2020) PEMEtrexed (ALIMTA) 1,000 mg in sodium chloride 0.9 % 100 mL chemo infusion, 500 mg/m2 = 1,000 mg, Intravenous,  Once, 2 of 4 cycles Dose modification: 375 mg/m2 (original dose 500 mg/m2, Cycle 2, Reason: Dose not tolerated) Administration: 1,000 mg (12/19/2019), 700 mg (01/09/2020) CISplatin (PLATINOL) 146 mg in sodium chloride 0.9 % 500 mL chemo infusion, 75 mg/m2 = 146 mg, Intravenous,  Once, 2 of 4 cycles Dose modification: 56 mg/m2 (original dose 75 mg/m2, Cycle 2, Reason: Dose not tolerated) Administration: 146 mg (12/19/2019), 105 mg (01/09/2020) fosaprepitant (EMEND) 150 mg in sodium chloride 0.9 % 145 mL IVPB, 150 mg, Intravenous,  Once, 2 of 4 cycles Administration: 150 mg  (12/19/2019), 150 mg (01/09/2020) INV-pembrolizumab [facility] [id number] ([id number]) 400 mg in sodium chloride 0.9 % 50 mL chemo infusion, 400 mg, Intravenous, Once, 1 of 2 cycles Administration: 400 mg (12/19/2019)  for chemotherapy treatment.       REVIEW OF SYSTEMS:   Constitutional: Denies fevers, chills  Respiratory: Denies cough, dyspnea or wheezes Cardiovascular: Denies palpitation, chest discomfort Gastrointestinal: Reports diarrhea Skin: Denies abnormal skin rashes Lymphatics: Denies new lymphadenopathy or easy bruising Neurological:Denies numbness, tingling or new weaknesses Behavioral/Psych: Mood is stable, no new changes  Extremities: No lower extremity edema All other systems were reviewed with the patient and are negative.  I have reviewed the past medical history, past surgical history, social history and family history with the patient and they are unchanged from previous note.   PHYSICAL EXAMINATION: ECOG PERFORMANCE STATUS: 1 - Symptomatic but completely ambulatory  Vitals:   01/17/20 0700 01/17/20 0744  BP: (!) 129/76   Pulse: 90   Resp: 18   Temp: 98.2 F (36.8 C)   SpO2: 96% 95%   Filed Weights   01/16/20 0052  Weight: 70.4 kg    Intake/Output from previous day: 07/26 0701 - 07/27 0700 In: 1723 [P.O.:100; I.V.:1100; IV Piggyback:523] Out: 450 [Urine:450]  GENERAL:alert, no distress and comfortable SKIN: skin color, texture, turgor are normal, no rashes or significant lesions LUNGS: clear to auscultation and percussion with normal breathing effort HEART: regular rate & rhythm and no murmurs and no lower extremity edema ABDOMEN: Positive bowel sounds, soft, reports mild tenderness on the left  side, ventral hernia noted. Musculoskeletal:no cyanosis of digits and no clubbing  NEURO: alert & oriented x 3 with fluent speech, no focal motor/sensory deficits  LABORATORY DATA:  I have reviewed the data as listed CMP Latest Ref Rng & Units 01/17/2020 01/16/2020  01/15/2020  Glucose 70 - 99 mg/dL 114(H) 161(H) 219(H)  BUN 8 - 23 mg/dL 31(H) 32(H) 37(H)  Creatinine 0.61 - 1.24 mg/dL 0.99 1.12 1.46(H)  Sodium 135 - 145 mmol/L 138 137 136  Potassium 3.5 - 5.1 mmol/L 3.1(L) 3.3(L) 4.7  Chloride 98 - 111 mmol/L 106 101 92(L)  CO2 22 - 32 mmol/L 23 26 28   Calcium 8.9 - 10.3 mg/dL 7.8(L) 8.0(L) 9.9  Total Protein 6.5 - 8.1 g/dL 4.6(L) 4.8(L) 6.0(L)  Total Bilirubin 0.3 - 1.2 mg/dL 0.7 0.7 1.2  Alkaline Phos 38 - 126 U/L 43 45 68  AST 15 - 41 U/L 15 16 21   ALT 0 - 44 U/L 17 18 25     Lab Results  Component Value Date   WBC 0.8 (LL) 01/17/2020   HGB 11.1 (L) 01/17/2020   HCT 33.6 (L) 01/17/2020   MCV 93.1 01/17/2020   PLT 78 (L) 01/17/2020   NEUTROABS 0.4 (L) 01/17/2020    DG Chest 2 View  Result Date: 01/15/2020 CLINICAL DATA:  Dyspnea EXAM: CHEST - 2 VIEW COMPARISON:  01/01/2020 FINDINGS: Previously noted PICC line has been removed. The lungs are mildly hyperinflated, in keeping with changes of underlying COPD, unchanged from prior examination. No superimposed focal pulmonary infiltrate. Stable nodule within the left lung base laterally, indeterminate. Surgical staple line noted at the left hilum. No pneumothorax or pleural effusion. Cardiac size within normal limits. No acute bone abnormality. IMPRESSION: No active cardiopulmonary disease. COPD. Stable nodule at the left lung base laterally. This could be better assessed with CT examination once the patient's acute issues have resolved. Electronically Signed   By: Fidela Salisbury MD   On: 01/15/2020 17:52   DG Chest 2 View  Result Date: 12/26/2019 CLINICAL DATA:  Congestion and cough this week. EXAM: CHEST - 2 VIEW COMPARISON:  December 08, 2019 FINDINGS: The heart size and mediastinal contours are within normal limits. Both lungs are clear. The visualized skeletal structures are unremarkable. IMPRESSION: No active cardiopulmonary disease. Electronically Signed   By: Abelardo Diesel M.D.   On: 12/26/2019  11:04   CT ABDOMEN PELVIS W CONTRAST  Result Date: 01/15/2020 CLINICAL DATA:  74 year old male with abdominal pain. EXAM: CT ABDOMEN AND PELVIS WITH CONTRAST TECHNIQUE: Multidetector CT imaging of the abdomen and pelvis was performed using the standard protocol following bolus administration of intravenous contrast. CONTRAST:  13mL OMNIPAQUE IOHEXOL 300 MG/ML  SOLN COMPARISON:  CT abdomen pelvis dated 12/26/2019. FINDINGS: Lower chest: The visualized lung bases are clear. No intra-abdominal free air or free fluid. Hepatobiliary: Subcentimeter hypodense lesion in the right lobe of the liver inferiorly with a small internal enhancing focus similar to prior CT is not characterized. MRI may provide better characterization if clinically indicated. There is mild intrahepatic biliary ductal dilatation. There are multiple stones within the gallbladder. There is mild haziness of the gallbladder wall with trace pericholecystic fluid. Ultrasound may provide better evaluation of the gallbladder if there is clinical concern for acute cholecystitis. Pancreas: Unremarkable. No pancreatic ductal dilatation or surrounding inflammatory changes. Spleen: Normal in size without focal abnormality. Adrenals/Urinary Tract: The right adrenal gland is unremarkable. There is a 1 cm indeterminate left adrenal nodule. There is severe right renal parenchyma atrophy  and cortical thinning. There is no hydronephrosis on either side. There is symmetric enhancement and excretion of contrast by both kidneys. Several subcentimeter bilateral renal hypodense lesions are too small to characterize. The visualized ureters appear unremarkable. The urinary bladder is decompressed around a Foley catheter. Apparent soft tissue density at the base of the bladder likely related to enlarged prostate gland with median lobe hypertrophy. Stomach/Bowel: There is diffuse long segment thickening and inflammatory changes of the small bowel loops throughout the  abdomen consistent with enteritis. Overall the degree of inflammation is similar to prior CT. Multiple ventral hernias containing several loops of small bowel as seen on the prior CT. There is abutment of these loops of bowel to the anterior peritoneal wall compatible with adhesions. No definite evidence of bowel obstruction at this time. The appendix is unremarkable. Vascular/Lymphatic: There is advanced aortoiliac atherosclerotic disease. There is occlusion of the distal abdominal aorta and an aortobifemoral bypass graft. The bypass graft is patent. The SMV, splenic vein, and main portal vein are patent. No portal venous gas. There is no adenopathy. Reproductive: Enlarged prostate gland measuring approximately 5 cm in transverse axial diameter. There is median lobe hypertrophy. Other: None Musculoskeletal: Bilateral L5 pars defects. No acute osseous pathology. IMPRESSION: 1. Diffuse thickening and inflammatory changes of normal caliber bowel loops may represent enteritis. Bowel ischemia is not entirely excluded. Clinical correlation is recommended. There is however no pneumatosis, free air, or portal venous gas. No definite evidence of obstruction. 2. Multiple ventral hernia is similar to prior CT containing segments of small bowel with findings of adhesions to the anterior peritoneal wall. 3. Cholelithiasis with mild haziness of the gallbladder wall. Ultrasound may provide better evaluation of the gallbladder if there is clinical concern for acute cholecystitis. 4. Occlusion of the distal abdominal aorta and an aortobifemoral bypass graft. The bypass graft is patent. 5. Aortic Atherosclerosis (ICD10-I70.0). Electronically Signed   By: Anner Crete M.D.   On: 01/15/2020 22:53   CT ABDOMEN PELVIS W CONTRAST  Result Date: 12/26/2019 CLINICAL DATA:  Acute abdomen pain.  History of lung cancer. EXAM: CT ABDOMEN AND PELVIS WITH CONTRAST TECHNIQUE: Multidetector CT imaging of the abdomen and pelvis was performed  using the standard protocol following bolus administration of intravenous contrast. CONTRAST:  46mL OMNIPAQUE IOHEXOL 300 MG/ML  SOLN COMPARISON:  April 05, 2019 FINDINGS: Lower chest: Mild dependent atelectasis of right lung base is identified. The heart size is normal. Hepatobiliary: Stable enhancing low-density lesion is identified in the posterior segment right lobe liver unchanged compared prior CT. The liver is otherwise. Gallstones in gallbladder. No inflammation is noted surrounding the gallbladder. The biliary tree is normal. Pancreas: Unremarkable. No pancreatic ductal dilatation or surrounding inflammatory changes. Spleen: Normal in size without focal abnormality. Adrenals/Urinary Tract: 1 cm nodule is identified in the left adrenal gland unchanged. The right adrenal gland is normal. There is atrophy of the right kidney unchanged. There left kidney cyst. There is no hydronephrosis bilaterally. Foley catheter is identified in a decompressed bladder. Stomach/Bowel: There are multiple thick wall mild dilated small bowel loops in the mid and lower abdomen probably as a result of partial bowel obstruction due to herniated small bowel loops in the midline 10 cm anterior abdominal hernia. Colon is normal. The appendix is not seen but no inflammation is noted around cecum. Vascular/Lymphatic: Aortic atherosclerosis. No enlarged abdominal or pelvic lymph nodes. Reproductive: Enlarged prostate with calcifications are identified. Other: Midline anterior abdominal hernia containing small bowel are identified as previously  described. Subcutaneous air is identified in the anterior right abdominal and pelvic subcutaneous fat, source indeterminate. Clinical correlation regarding recent injection in the area is suggested. There is no free air in the abdomen and pelvis. Musculoskeletal: Degenerative joint changes of the spine are noted. IMPRESSION: 1. Multiple thick wall mild dilated small bowel loops in the mid and  lower abdomen probably as a result of partial bowel obstruction due to herniated small bowel loops in the midline anterior abdominal hernia. 2. Subcutaneous air is identified in the anterior right abdominal and pelvic subcutaneous fat, source indeterminate. Clinical correlation regarding recent injection in the area is suggested. There is no free air in the abdomen and pelvis. 3. Aortic atherosclerosis. These results were called by telephone at the time of interpretation on 12/26/2019 at 12:57 pm to provider [facility] , who verbally acknowledged these results. Aortic Atherosclerosis (ICD10-I70.0). Electronically Signed   By: Abelardo Diesel M.D.   On: 12/26/2019 13:01   DG CHEST PORT 1 VIEW  Result Date: 01/01/2020 CLINICAL DATA:  74 year old male with nonfunctioning right PICC line. Query positioning. EXAM: PORTABLE CHEST 1 VIEW COMPARISON:  12/26/2019 chest radiographs and earlier. FINDINGS: Portable AP semi upright view at 2331 hours. New right side PICC line. The tip is at the level of the confluence of the right innominate vein and SVC, just above the level of the aortic arch. The line might be twisted or kinked near the skin site at the right arm (arrow). Stable lung volumes and mediastinal contours with tortuous, calcified thoracic aorta. Visualized tracheal air column is within normal limits. No pneumothorax, pulmonary edema or pleural effusion. Mildly increased right lung base patchy opacity. Stable ventilation elsewhere. IMPRESSION: 1. Right side PICC line tip at the confluence of the right innominate vein and SVC. Note that the line might be twisted or kinked near the skin site at the right arm. 2. Increased right lung base patchy opacity since 12/26/2019. Consider atelectasis or infection. Electronically Signed   By: Genevie Ann M.D.   On: 01/01/2020 23:44   [place] EKG SITE RITE  Result Date: 12/27/2019 If Site Rite image not attached, placement could not be confirmed due to current cardiac  rhythm.   ASSESSMENT AND PLAN: This is a very pleasant 74 year old white male with stage IIIa (T2a, N2, M0) non-small cell lung cancer, adenocarcinoma who presented with a left upper lobe lung nodule and after resection the patient was found to have metastatic disease to the mediastinal lymph nodes.  This was diagnosed in March 2021.  He has been receiving adjuvant systemic chemotherapy with cisplatin, pemetrexed, and pembrolizumab.  Status post 2 cycles.  His treatment has been complicated with neutropenia as well as electrolyte imbalance including hypomagnesemia and hypokalemia.  He was hospitalized following cycle #1 as well.  With cycle #2 his cisplatin was dose reduced to 56 mg per metered squared and pemetrexed was dose reduced to 325 mg/m.  Despite dose reduction, he developed neutropenia.  The patient is feeling better today.  However, he has ongoing diarrhea. C. difficile is negative and GI panel is pending.  Recommend initiating Imodium if GI panel is negative.  He remains neutropenic.  I agree with continuing Granix until [place] is 1.5 or higher.  Anemia is mild and we will continue to monitor this.  Platelets are starting to drop like related to chemotherapy.  Continue to monitor platelet count closely and transfuse for platelet count less than 20,000 or active bleeding.    LOS: 2 days  Mikey Bussing, DNP, AGPCNP-BC, AOCNP 01/17/20

## 2020-01-17 NOTE — Progress Notes (Signed)
Pharmacy Antibiotic Note  Chad Gomez is a 74 y.o. male admitted on 01/15/2020 with intra-abdominal infection.  Pharmacy has been consulted for Cefepime dosing. 01/17/2020  D#3 cefepime/flagyl for IAI/FN WBC 0.8, ANC 0.4- got granix 480 7/26 SCr down to 0.99   Plan: Rec adding fluconazole 200 mg po qday x 14 days for funguria Increase Cefepime to 2gm IV q8h Monitor renal function and cx data   Weight: 70.4 kg (155 lb 3.3 oz)  Temp (24hrs), Avg:98 F (36.7 C), Min:97.7 F (36.5 C), Max:98.2 F (36.8 C)  Recent Labs  Lab 01/15/20 1704 01/15/20 1813 01/15/20 1925 01/16/20 0136 01/16/20 0712 01/16/20 0728 01/17/20 0554  WBC 0.7*  --   --   --   --  0.4* 0.8*  CREATININE 1.46*  --   --   --   --  1.12 0.99  LATICACIDVEN  --  3.2* 3.4* 2.6* 1.1  --   --     Estimated Creatinine Clearance: 66.2 mL/min (by C-G formula based on SCr of 0.99 mg/dL).    Allergies  Allergen Reactions   Morphine And Related Anxiety and Other (See Comments)    Combative, incoherent     Tape Other (See Comments) and Rash    Surgical tape Adhesive tape causes blisters   Amoxicillin Other (See Comments)    Severe stomach pain   Naproxen Swelling    Eyes swell   Antimicrobials this admission: 7/25 Cefepime >>  7/25 Flagyl >>   Dose adjustments this admission: 7/27 cefepime 2 q12> q8 Microbiology results: 7/25 BCx2: ngtd 7/25 UCx:  80 K yeast F 7/25 COVID: negative 7/25 GI panel: sent 7/25 C diff neg    Thank you for allowing pharmacy to be a part of this patients care.  Eudelia Bunch, Pharm.D 01/17/2020 10:28 AM

## 2020-01-18 DIAGNOSIS — E860 Dehydration: Secondary | ICD-10-CM | POA: Diagnosis not present

## 2020-01-18 LAB — GASTROINTESTINAL PANEL BY PCR, STOOL (REPLACES STOOL CULTURE)

## 2020-01-18 LAB — COMPREHENSIVE METABOLIC PANEL
ALT: 14 U/L (ref 0–44)
AST: 14 U/L — ABNORMAL LOW (ref 15–41)
Albumin: 2.4 g/dL — ABNORMAL LOW (ref 3.5–5.0)
Alkaline Phosphatase: 42 U/L (ref 38–126)
Anion gap: 6 (ref 5–15)
BUN: 30 mg/dL — ABNORMAL HIGH (ref 8–23)
CO2: 25 mmol/L (ref 22–32)
Calcium: 8.2 mg/dL — ABNORMAL LOW (ref 8.9–10.3)
Chloride: 111 mmol/L (ref 98–111)
Creatinine, Ser: 0.81 mg/dL (ref 0.61–1.24)
GFR calc Af Amer: >60 mL/min (ref >60)
GFR calc non Af Amer: >60 mL/min (ref >60)
Glucose, Bld: 147 mg/dL — ABNORMAL HIGH (ref 70–99)
Potassium: 3.2 mmol/L — ABNORMAL LOW (ref 3.5–5.1)
Sodium: 142 mmol/L (ref 135–145)
Total Bilirubin: 0.5 mg/dL (ref 0.3–1.2)
Total Protein: 4.8 g/dL — ABNORMAL LOW (ref 6.5–8.1)

## 2020-01-18 LAB — CBC WITH DIFFERENTIAL/PLATELET
Abs Immature Granulocytes: 0 10*3/uL (ref 0.00–0.07)
Basophils Absolute: 0 10*3/uL (ref 0.0–0.1)
Basophils Relative: 0 %
Eosinophils Absolute: 0 10*3/uL (ref 0.0–0.5)
Eosinophils Relative: 0 %
HCT: 36.3 % — ABNORMAL LOW (ref 39.0–52.0)
Hemoglobin: 11.7 g/dL — ABNORMAL LOW (ref 13.0–17.0)
Lymphocytes Relative: 27 %
Lymphs Abs: 0.4 10*3/uL — ABNORMAL LOW (ref 0.7–4.0)
MCH: 30.6 pg (ref 26.0–34.0)
MCHC: 32.2 g/dL (ref 30.0–36.0)
MCV: 95 fL (ref 80.0–100.0)
Monocytes Absolute: 0.2 10*3/uL (ref 0.1–1.0)
Monocytes Relative: 11 %
Neutro Abs: 0.9 10*3/uL — ABNORMAL LOW (ref 1.7–7.7)
Neutrophils Relative %: 62 %
Platelets: 54 10*3/uL — ABNORMAL LOW (ref 150–400)
RBC: 3.82 MIL/uL — ABNORMAL LOW (ref 4.22–5.81)
RDW: 13.3 % (ref 11.5–15.5)
WBC: 1.4 10*3/uL — CL (ref 4.0–10.5)
nRBC: 0 % (ref 0.0–0.2)

## 2020-01-18 LAB — GLUCOSE, CAPILLARY
Glucose-Capillary: 114 mg/dL — ABNORMAL HIGH (ref 70–99)
Glucose-Capillary: 120 mg/dL — ABNORMAL HIGH (ref 70–99)
Glucose-Capillary: 122 mg/dL — ABNORMAL HIGH (ref 70–99)
Glucose-Capillary: 151 mg/dL — ABNORMAL HIGH (ref 70–99)

## 2020-01-18 MED ORDER — POTASSIUM CHLORIDE CRYS ER 20 MEQ PO TBCR
40.0000 meq | EXTENDED_RELEASE_TABLET | ORAL | Status: AC
  Administered 2020-01-18 (×2): 40 meq via ORAL
  Filled 2020-01-18 (×2): qty 2

## 2020-01-18 NOTE — Progress Notes (Signed)
PROGRESS NOTE  WORTH KOBER ZHG:[id number] DOB: 1946/04/23 DOA: 01/15/2020 PCP: [name], MD  Brief History   74 y.o.malewith medical history significant oftype 2 diabetes, hypertension, hyperlipidemia, COPD, chronic kidney disease, BPH, chronic indwelling Foley, peripheral vascular disease status post bypass 1 year ago, ongoing tobacco use, stage IIIa non-small cell cancer of the lung status post robotic assisted lobectomy on Oct 28, 2019 undergoing chemotherapy. Following his first round of chemotherapy he was admitted with enteritis thought to be related to his chemo, poor p.o. intake, lactic acidosis related to dehydration, and required TPN, antibiotics, NG tube placement. He was admitted for approximately 1 week for rehydration and electrolyte repletion. He had a second chemotherapy on 01/09/2020 and since that time has had weakness, poor appetite, and ongoing nonbloody diarrhea. He reports chronic lower left-sided abdominal pain. During his last admission he underwent CT scan and enteritis was noted. He does have a large ventral hernia related to his previous bypass.  ED Course:In the ED he was noted to be neutropenic with a WBC of 0.7, acute on chronic kidney injury with a creatinine of 1.46, and anion gap of 16, a low magnesium at 1.5, lactic acid of 3.2. He was given an IV fluid bolus as well as cefepime and Flagyl for presumed neutropenic fever. Urinalysis reveals moderate leuks and protein.  Consultants  . Medical Oncology  Procedures  . None  Antibiotics   Anti-infectives (From admission, onward)   Start     Dose/Rate Route Frequency Ordered Stop   01/17/20 1400  ceFEPIme (MAXIPIME) 2 g in sodium chloride 0.9 % 100 mL IVPB     Discontinue     2 g 200 mL/hr over 30 Minutes Intravenous Every 8 hours 01/17/20 1023     01/17/20 1300  fluconazole (DIFLUCAN) tablet 200 mg     Discontinue     200 mg Oral Daily 01/17/20 1135 01/31/20 0959   01/16/20 0800  ceFEPIme  (MAXIPIME) 2 g in sodium chloride 0.9 % 100 mL IVPB  Status:  Discontinued        2 g 200 mL/hr over 30 Minutes Intravenous Every 12 hours 01/15/20 2143 01/17/20 1023   01/16/20 0400  metroNIDAZOLE (FLAGYL) IVPB 500 mg     Discontinue     500 mg 100 mL/hr over 60 Minutes Intravenous Every 8 hours 01/15/20 2125     01/16/20 0345  ceFEPIme (MAXIPIME) 2 g in sodium chloride 0.9 % 100 mL IVPB  Status:  Discontinued        2 g 200 mL/hr over 30 Minutes Intravenous  Once 01/15/20 2125 01/16/20 0100   01/15/20 1945  ceFEPIme (MAXIPIME) 2 g in sodium chloride 0.9 % 100 mL IVPB        2 g 200 mL/hr over 30 Minutes Intravenous  Once 01/15/20 1936 01/15/20 2123   01/15/20 1945  metroNIDAZOLE (FLAGYL) IVPB 500 mg        500 mg 100 mL/hr over 60 Minutes Intravenous  Once 01/15/20 1936 01/15/20 2123    .  Subjective  The patient states that his diarrhea is improving, but that it is still occurring. No new complaints.  Objective   Vitals:  Vitals:   01/18/20 1950 01/18/20 2004  BP:  (!) 138/93  Pulse:  90  Resp:  18  Temp:  97.7 F (36.5 C)  SpO2: 93% 94%   Exam:  Constitutional:  . The patient is awake, alert, and oriented x 3. No acute distress. Eyes:  . pupils  and irises appear normal . Normal lids and conjunctivae ENMT:  . grossly normal hearing  . Lips appear normal . external ears, nose appear normal . Oropharynx: mucosa, tongue,posterior pharynx appear normal Neck:  . neck appears normal, no masses, normal ROM, supple . no thyromegaly Respiratory:  . No increased work of breathing. . No wheezes, rales, or rhonchi . No tactile fremitus Cardiovascular:  . Regular rate and rhythm . No murmurs, ectopy, or gallups. . No lateral PMI. No thrills. Abdomen:  . Abdomen is soft, non-tender, non-distended . No hernias, masses, or organomegaly . Normoactive bowel sounds.  Musculoskeletal:  . No cyanosis, clubbing, or edema Skin:  . No rashes, lesions, ulcers . palpation of  skin: no induration or nodules Neurologic:  . CN 2-12 intact . Sensation all 4 extremities intact Psychiatric:  . Mental status o Mood, affect appropriate o Orientation to person, place, time  . judgment and insight appear intact  I have personally reviewed the following:   Today's Data  . Vitals, CMP, CBC  Micro Data  . Urine culture positive for yeast.  Scheduled Meds: . arformoterol  15 mcg Nebulization BID  . Chlorhexidine Gluconate Cloth  6 each Topical Daily  . feeding supplement (KATE [place]AR[place] STANDARD 1.4)  325 mL Oral BID BM  . fluconazole  200 mg Oral Daily  . insulin aspart  0-9 Units Subcutaneous TID WC  . nicotine  21 mg Transdermal Daily  . tamsulosin  0.4 mg Oral QPC supper  . Tbo-filgastrim (GRANIX) SQ  480 mcg Subcutaneous q1800  . umeclidinium bromide  1 puff Inhalation Daily   Continuous Infusions: . ceFEPime (MAXIPIME) IV 2 g (01/18/20 1500)  . metronidazole 500 mg (01/18/20 1239)    Principal Problem:   Dehydration Active Problems:   Compulsive tobacco user syndrome   Atherosclerosis of native artery of both lower extremities with intermittent claudication (HCC)   Centrilobular emphysema (HCC)   Stage 4 very severe COPD by GOLD classification ([place])   Hypertension   Dyslipidemia   Type 2 diabetes mellitus with complication, without long-term current use of insulin (HCC)   Lung cancer, lingula (HCC)   Leukopenia due to antineoplastic chemotherapy (HCC)   Lactic acidosis   Chronic indwelling Foley catheter   Acute lower UTI   Protein-calorie malnutrition, severe   Hypomagnesemia   LOS: 3 days   A & P   Acute diarrhea: Likely secondary to chemotherapeutic agents.  CT scan showed findings suggestive of enteritis.  C. difficile testing was negative.  GI pathogen panel is pending.  Clinically no signs of obstruction.  Diarrhea seems to be subsiding.  Continue to monitor.  He does have some tenderness in the abdomen which is from his chronic ventral  hernia.  Neutropenia: Improving. Unclear if the patient had fever at home or not.  Noted to have low-grade fevers here in the hospital.  With [place] of 200.  Started on Granix.  ANC is 400 today.  Continue for now.  Monitor counts daily.    Acute renal failure/hypokalemia/hypomagnesemia: BUN was 37 along with a creatinine of 1.46 at admission.  Most likely due to volume loss from diarrhea.  IV fluids were initiated.  Renal function continues to improve.  Creatinine is down to 0.99.  Low potassium is due to GI loss.  This will be repleted.  Magnesium improved to 2.5 today.    Lactic acidosis: Probably due to hypovolemia.  Noted to be on Metformin at home which could have contributed as well.  Improved.  Sepsis, present on admission/urinary tract infection: Patient was tachycardic, tachypneic with neutropenia.  Cultures are pending.  Continue cefepime and metronidazole for now.  Cultures are pending.  No growth so far.  Urine culture with 80,000 colonies of yeast.  Patient has a chronic indwelling Foley.  Neutropenic.  The patient is receiving fluconazole 200 mg daily.  Stage IIIa non-small cell lung cancer: Followed by Dr. Julien Nordmann.  Receiving chemotherapy in the outpatient setting.  Chest x-ray did not show any acute findings.  Diabetes mellitus type 2: On Metformin at home which is held especially due to lactic acidosis.  Monitor CBGs.  SSI.  Normocytic anemia and thrombocytopenia: Most likely due to chemotherapy.  Hemoglobin slightly lower today but stable for the most part.  Platelet count is also lower today.  Stop heparin products.  Severe protein calorie malnutrition: Previously has required TPN.  For now we will monitor his oral intake.  Advance his diet.  Stage IV COPD by Gold classification: Respiratory status appears to be stable.  Continue inhalers.  Ongoing tobacco abuse: Nicotine patch.  Counseled.  Essential hypertension: Holding his antihypertensive medication including  amlodipine and indapamide.  Blood pressure is reasonably well controlled.  Dyslipidemia: Resume his home medications when more stable.  History of peripheral vascular disease: Stable.  Incidental cholelithiasis: Noted on CT scan.  Outpatient management.  Chronic indwelling Foley: Continue routine Foley care.  I have seen and examined this patient myself. I have spent 34 minutes in his evaluation and care.  DVT Prophylaxis: Lovenox will be stopped due to low platelets today.  SCDs. Code Status: Full code Family Communication: Discussed with the patient Disposition Plan: Hopefully will be able to return home when improved.  PT and OT evaluation.  Status is: Inpatient  Remains inpatient appropriate because:IV treatments appropriate due to intensity of illness or inability to take PO and Inpatient level of care appropriate due to severity of illness   Dispo:             Patient From: Home             Planned Disposition: Home             Expected discharge date: 01/19/20             Medically stable for discharge: No  Tensley Wery, DO [facility] Direct contact: see [email or web address]  7PM-7AM contact night coverage as above 01/18/2020, 8:08 PM  LOS: 3 days

## 2020-01-18 NOTE — Evaluation (Signed)
Occupational Therapy Evaluation Patient Details Name: Chad Gomez MRN: [id number] DOB: 05-May-1946 Today's Date: 01/18/2020    History of Present Illness 74 y.o. male with medical history significant of type 2 diabetes, hypertension, hyperlipidemia, COPD, chronic kidney disease, BPH, chronic indwelling Foley, peripheral vascular disease status post bypass 1 year ago, ongoing tobacco use, stage IIIa non-small cell cancer of the lung status post robotic assisted lobectomy on Oct 28, 2019 undergoing chemotherapy.  Following his first round of chemotherapy he was admitted with enteritis thought to be related to his chemo, poor p.o. intake, lactic acidosis related to dehydration, and required TPN, antibiotics, NG tube placement.  He was admitted for approximately 1 week for rehydration and electrolyte repletion.  He had a second chemotherapy on 01/09/2020 and since that time has had weakness, poor appetite, and ongoing nonbloody diarrhea.  He reports chronic lower left-sided abdominal pain.  During his last admission he underwent CT scan and enteritis was noted.  He does have a large ventral hernia related to his previous bypass.   Clinical Impression   Chad Gomez is a 74 year old man who presents with generalized weakness and decreased activity tolerance who presents with decreased independence with ambulation and ADLs. Patient requiring set up for ADLs and demonstrated limited mobility on evaluation today secondary to complaints of a "fuzzy head." Patient will benefit from skilled OT services while in hospital to improve deficits and independence in order to return home at discharge. Do not expect patient will need OT services at discharge.    Follow Up Recommendations  No OT follow up    Equipment Recommendations  None recommended by OT    Recommendations for Other Services       Precautions / Restrictions Precautions Precautions: Fall Restrictions Weight Bearing Restrictions: No       Mobility Bed Mobility Overal bed mobility: Needs Assistance Bed Mobility: Supine to Sit     Supine to sit: Supervision        Transfers Overall transfer level: Needs assistance Equipment used: None Transfers: Sit to/from Stand;Stand Pivot Transfers Sit to Stand: Min guard Stand pivot transfers: Min guard       General transfer comment: Patient reports his head is fuzzy with sitting up with a slight increase with standing. Mobilty limited at patient's request.    Balance Overall balance assessment: Mild deficits observed, not formally tested                                         ADL either performed or assessed with clinical judgement   ADL Overall ADL's : Needs assistance/impaired Eating/Feeding: Independent   Grooming: Set up;Sitting   Upper Body Bathing: Set up;Sitting   Lower Body Bathing: Set up;Sit to/from stand;Min guard   Upper Body Dressing : Set up;Sitting   Lower Body Dressing: Set up;Sit to/from stand;Min guard   Toilet Transfer: Min guard;BSC;Stand-pivot   Toileting- Water quality scientist and Hygiene: Min guard;Sit to/from stand       Functional mobility during ADLs: Min guard       Vision   Vision Assessment?: No apparent visual deficits     Perception     Praxis      Pertinent Vitals/Pain Pain Assessment: No/denies pain (Doesn't report pain during evaluation but reports being on Tramadol)     Hand Dominance Right   Extremity/Trunk Assessment Upper Extremity Assessment Upper Extremity Assessment: Overall [facility]  for tasks assessed   Lower Extremity Assessment Lower Extremity Assessment: Defer to PT evaluation   Cervical / Trunk Assessment Cervical / Trunk Assessment: Normal   Communication Communication Communication: No difficulties   Cognition Arousal/Alertness: Awake/alert Behavior During Therapy: WFL for tasks assessed/performed Overall Cognitive Status: Within Functional Limits for tasks  assessed                                     General Comments       Exercises     Shoulder Instructions      Home Living Family/patient expects to be discharged to:: Private residence Living Arrangements: Spouse/significant other Available Help at Discharge: Family Type of Home: House Home Access: Stairs to enter Technical brewer of Steps: 3-4 Entrance Stairs-Rails: Left Home Layout: One level     Bathroom Shower/Tub: Teacher, early years/pre: Standard     Home Equipment: Cane - single point          Prior Functioning/Environment Level of Independence: Independent        Comments: Pt reports independent with ADLs, ambulates community distances with or without SPC depending on fatigue level, no longer driving, no longer working at [employer]. Pt denies home O2 use. Pt denies recent falls at home.        OT Problem List: Decreased strength;Decreased activity tolerance;Impaired balance (sitting and/or standing);Pain      OT Treatment/Interventions: Self-care/ADL training;Therapeutic exercise;Therapeutic activities;DME and/or AE instruction;Balance training;Patient/family education    OT Goals(Current goals can be found in the care plan section) Acute Rehab OT Goals Patient Stated Goal: To get stronger OT Goal Formulation: With patient Time For Goal Achievement: 02/01/20 Potential to Achieve Goals: Good  OT Frequency: Min 2X/week   Barriers to D/C:            Co-evaluation              AM-PAC OT "6 Clicks" Daily Activity     Outcome Measure Help from another person eating meals?: None Help from another person taking care of personal grooming?: A Little Help from another person toileting, which includes using toliet, bedpan, or urinal?: A Little Help from another person bathing (including washing, rinsing, drying)?: A Little Help from another person to put on and taking off regular upper body clothing?: None Help from  another person to put on and taking off regular lower body clothing?: A Little 6 Click Score: 20   End of Session Nurse Communication:  (okay to see patient, patient on contact precautions per RN)  Activity Tolerance: Patient tolerated treatment well Patient left: in bed;with call bell/phone within reach;with bed alarm set  OT Visit Diagnosis: Muscle weakness (generalized) (M62.81)                Time: 6333-5456 OT Time Calculation (min): 16 min Charges:  OT General Charges $OT Visit: 1 Visit OT Evaluation $OT Eval Low Complexity: 1 Low  Veola Cafaro, OTR/L [place]  Office [phone] Pager: [phone]   Lenward Chancellor 01/18/2020, 12:15 PM

## 2020-01-18 NOTE — Evaluation (Signed)
Physical Therapy Evaluation Patient Details Name: Chad Gomez MRN: [id number] DOB: 1945-12-06 Today's Date: 01/18/2020   History of Present Illness  74 y.o. male with medical history significant of type 2 diabetes, hypertension, hyperlipidemia, COPD, chronic kidney disease, BPH, chronic indwelling Foley, peripheral vascular disease status post bypass 1 year ago, ongoing tobacco use, stage IIIa non-small cell cancer of the lung status post robotic assisted lobectomy on Oct 28, 2019 undergoing chemotherapy.  Following his first round of chemotherapy he was admitted with enteritis thought to be related to his chemo, poor p.o. intake, lactic acidosis related to dehydration, and required TPN, antibiotics, NG tube placement.  He was admitted for approximately 1 week for rehydration and electrolyte repletion.  He had a second chemotherapy on 01/09/2020 and since that time has had weakness, poor appetite, and ongoing nonbloody diarrhea.  He reports chronic lower left-sided abdominal pain.  During his last admission he underwent CT scan and enteritis was noted.  He does have a large ventral hernia related to his previous bypass.  Clinical Impression  Pt admitted with above diagnosis. Pt with abdominal pain, but able to come to seated EOB with hand assisting on bedrail. Pt dizziness once standing and attempting to take steps, requests to sit down in bedside chair. Pt declined further ambulation due to dizziness and possible restroom needs. Pt without overt loss of balance with transfer and good demonstration of safe RW use to steady self with transfer. Pt on RA with SpO2 90-94% during mobility, left O2 off and in arms reach, educated pt on O2 level and monitor visible for pt, educated to call nursing if O2 sat decreases below 90; NT notified. Pt currently with functional limitations due to the deficits listed below (see PT Problem List). Pt will benefit from skilled PT to increase their independence and safety with  mobility to allow discharge to the venue listed below.       Follow Up Recommendations No PT follow up;Supervision for mobility/OOB    Equipment Recommendations  None recommended by PT    Recommendations for Other Services       Precautions / Restrictions Precautions Precautions: Fall Restrictions Weight Bearing Restrictions: No      Mobility  Bed Mobility Overal bed mobility: Needs Assistance Bed Mobility: Supine to Sit  Supine to sit: Supervision  General bed mobility comments: increased time, use of bedrail to come to EOB  Transfers Overall transfer level: Needs assistance Equipment used: Rolling walker (2 wheeled) Transfers: Sit to/from [facility] Sit to Stand: Supervision Stand pivot transfers: Min guard  General transfer comment: BUE assisting to power up, dizziness occurs once tries to take a step so pivots over to chair with min guard assist  Ambulation/Gait  General Gait Details: pt declines 2* dizziness when standing and in case needs restroom  Stairs       Wheelchair Mobility    Modified Rankin (Stroke Patients Only)       Balance Overall balance assessment: Needs assistance Sitting-balance support: Feet supported;No upper extremity supported Sitting balance-Leahy Scale: Good Sitting balance - Comments: seated EOB   Standing balance support: During functional activity;Bilateral upper extremity supported Standing balance-Leahy Scale: Fair Standing balance comment: with RW        Pertinent Vitals/Pain Pain Assessment: 0-10 Pain Score: 5  Pain Location: abdomen Pain Descriptors / Indicators: Sore;Discomfort Pain Intervention(s): Limited activity within patient's tolerance;Monitored during session;RN gave pain meds during session    [place] expects to be discharged to:: Private residence  Living Arrangements: Spouse/significant other Available Help at Discharge: Family Type of Home: House Home Access:  Stairs to enter Entrance Stairs-Rails: Left Entrance Stairs-Number of Steps: 3-4 Home Layout: One level Home Equipment: Cane - single point      Prior Function Level of Independence: Independent         Comments: Pt reports independent with ADLs, ambulates community distances with or without SPC depending on fatigue level, no longer driving, no longer working at [employer]. Pt denies home O2 use. Pt denies recent falls at home.     Hand Dominance   Dominant Hand: Right    Extremity/Trunk Assessment   Upper Extremity Assessment Upper Extremity Assessment: Defer to OT evaluation    Lower Extremity Assessment Lower Extremity Assessment: Overall WFL for tasks assessed (AROM WNL, strength 4/5, denies numbness/tingling)    Cervical / Trunk Assessment Cervical / Trunk Assessment: Normal  Communication   Communication: No difficulties  Cognition Arousal/Alertness: Awake/alert Behavior During Therapy: WFL for tasks assessed/performed Overall Cognitive Status: Within Functional Limits for tasks assessed     General Comments General comments (skin integrity, edema, etc.): on RA with SpO2 90-94% with mobility, 93% when therapist exit room    Exercises     Assessment/Plan    PT Assessment    PT Problem List Decreased strength;Decreased activity tolerance;Decreased balance;Decreased knowledge of use of DME;Pain       PT Treatment Interventions DME instruction;Gait training;Stair training;Functional mobility training;Therapeutic activities;Therapeutic exercise;Balance training;Neuromuscular re-education;Patient/family education    PT Goals (Current goals can be found in the Care Plan section)  Acute Rehab PT Goals Patient Stated Goal: To get stronger PT Goal Formulation: With patient Time For Goal Achievement: 01/25/20 Potential to Achieve Goals: Good    Frequency Min 3X/week   Barriers to discharge        Co-evaluation               AM-PAC PT "6 Clicks"  Mobility  Outcome Measure Help needed turning from your back to your side while in a flat bed without using bedrails?: None Help needed moving from lying on your back to sitting on the side of a flat bed without using bedrails?: None Help needed moving to and from a bed to a chair (including a wheelchair)?: A Little Help needed standing up from a chair using your arms (e.g., wheelchair or bedside chair)?: A Little Help needed to walk in hospital room?: A Little Help needed climbing 3-5 steps with a railing? : A Little 6 Click Score: 20    End of Session   Activity Tolerance: Patient tolerated treatment well Patient left: in chair;with call bell/phone within reach;with chair alarm set Nurse Communication: Mobility status;Other (comment) (O2 left off but in arm's reach with monitor visible and pt educated) PT Visit Diagnosis: Other abnormalities of gait and mobility (R26.89)    Time: 5462-7035 PT Time Calculation (min) (ACUTE ONLY): 21 min   Charges:   PT Evaluation $PT Eval Low Complexity: 1 Low          Tori Coulson Wehner PT, DPT 01/18/20, 12:44 PM

## 2020-01-18 NOTE — Research (Signed)
01/18/2020  Research - [facility] [id number] study - INTEGRATION OF IMMUNOTHERAPY INTO ADJUVANT THERAPY FOR RESECTED NSCLC: ALCHEMIST CHEMO-IO (ACCIO)  Adverse Event Causality - additional events for CTEP-AERS report   NSCLC [place] Cisplatin [place]  Hematologic AEs (neutropenia, lymphopenia) 1 2 4 4   Hyperglycemia 3 3 3 3   Metabolism lab AEs (hypokalemia, hypophosphatemia) 3 2 2 2   Malnutrition 3 3 3 3   1  = unrelated 2 = unlikely 3 = possible 4 = probable 5 = definite  NOTE: the following possible causes were deleted from the CTEP-AERS report based on additional information: . History of ventral hernia - unrelated, per investigator . Grade 4 neutropenia - duplicate AE (listed as Solicited Event)  Cindy S. Brigitte Pulse BSN, RN, [facility] ([place]) 01/18/2020 4:33 PM

## 2020-01-18 NOTE — Research (Signed)
01/18/2020 - late entry for 01/17/2020  [place] [phone] research study - Patient notification per Protocol Update #04  As discussed with patient at the last office visit, a copy of the Research Participant letter was mailed to the patient at his home address.  Cindy S. Brigitte Pulse BSN, RN, CCRP 01/18/2020 4:55 PM

## 2020-01-19 ENCOUNTER — Encounter: Payer: Self-pay | Admitting: *Deleted

## 2020-01-19 DIAGNOSIS — C3492 Malignant neoplasm of unspecified part of left bronchus or lung: Secondary | ICD-10-CM

## 2020-01-19 DIAGNOSIS — E860 Dehydration: Secondary | ICD-10-CM | POA: Diagnosis not present

## 2020-01-19 LAB — CBC WITH DIFFERENTIAL/PLATELET
Abs Immature Granulocytes: 0 10*3/uL (ref 0.00–0.07)
Basophils Absolute: 0 10*3/uL (ref 0.0–0.1)
Basophils Relative: 0 %
Eosinophils Absolute: 0.1 10*3/uL (ref 0.0–0.5)
Eosinophils Relative: 3 %
HCT: 34.6 % — ABNORMAL LOW (ref 39.0–52.0)
Hemoglobin: 11.4 g/dL — ABNORMAL LOW (ref 13.0–17.0)
Lymphocytes Relative: 25 %
Lymphs Abs: 0.6 10*3/uL — ABNORMAL LOW (ref 0.7–4.0)
MCH: 31.1 pg (ref 26.0–34.0)
MCHC: 32.9 g/dL (ref 30.0–36.0)
MCV: 94.5 fL (ref 80.0–100.0)
Monocytes Absolute: 0.3 10*3/uL (ref 0.1–1.0)
Monocytes Relative: 12 %
Neutro Abs: 1.4 10*3/uL — ABNORMAL LOW (ref 1.7–7.7)
Neutrophils Relative %: 60 %
Platelets: 53 10*3/uL — ABNORMAL LOW (ref 150–400)
RBC: 3.66 MIL/uL — ABNORMAL LOW (ref 4.22–5.81)
RDW: 13.4 % (ref 11.5–15.5)
WBC: 2.3 10*3/uL — ABNORMAL LOW (ref 4.0–10.5)
nRBC: 0 % (ref 0.0–0.2)

## 2020-01-19 LAB — COMPREHENSIVE METABOLIC PANEL
ALT: 14 U/L (ref 0–44)
AST: 13 U/L — ABNORMAL LOW (ref 15–41)
Albumin: 2.3 g/dL — ABNORMAL LOW (ref 3.5–5.0)
Alkaline Phosphatase: 49 U/L (ref 38–126)
Anion gap: 6 (ref 5–15)
BUN: 28 mg/dL — ABNORMAL HIGH (ref 8–23)
CO2: 26 mmol/L (ref 22–32)
Calcium: 9 mg/dL (ref 8.9–10.3)
Chloride: 114 mmol/L — ABNORMAL HIGH (ref 98–111)
Creatinine, Ser: 0.8 mg/dL (ref 0.61–1.24)
GFR calc Af Amer: >60 mL/min (ref >60)
GFR calc non Af Amer: >60 mL/min (ref >60)
Glucose, Bld: 123 mg/dL — ABNORMAL HIGH (ref 70–99)
Potassium: 3.6 mmol/L (ref 3.5–5.1)
Sodium: 146 mmol/L — ABNORMAL HIGH (ref 135–145)
Total Bilirubin: 0.5 mg/dL (ref 0.3–1.2)
Total Protein: 4.8 g/dL — ABNORMAL LOW (ref 6.5–8.1)

## 2020-01-19 LAB — GLUCOSE, CAPILLARY
Glucose-Capillary: 120 mg/dL — ABNORMAL HIGH (ref 70–99)
Glucose-Capillary: 128 mg/dL — ABNORMAL HIGH (ref 70–99)
Glucose-Capillary: 118 mg/dL — ABNORMAL HIGH (ref 70–99)
Glucose-Capillary: 115 mg/dL — ABNORMAL HIGH (ref 70–99)
Glucose-Capillary: 123 mg/dL — ABNORMAL HIGH (ref 70–99)

## 2020-01-19 NOTE — Progress Notes (Signed)
Brief Oncology note:  CBC reviewed. ANC 1.4 today. Recommend one more dose of Granix today and then stop. I have adjusted the order so that Granix will stop after today's dose.   CBC    Component Value Date/Time   WBC 2.3 (L) 01/19/2020 0547   RBC 3.66 (L) 01/19/2020 0547   HGB 11.4 (L) 01/19/2020 0547   HGB 13.4 01/09/2020 0813   HCT 34.6 (L) 01/19/2020 0547   PLT 53 (L) 01/19/2020 0547   PLT 350 01/09/2020 0813   MCV 94.5 01/19/2020 0547   MCH 31.1 01/19/2020 0547   MCHC 32.9 01/19/2020 0547   RDW 13.4 01/19/2020 0547   LYMPHSABS 0.6 (L) 01/19/2020 0547   MONOABS 0.3 01/19/2020 0547   EOSABS 0.1 01/19/2020 0547   BASOSABS 0.0 01/19/2020 0547   Keep appts at [facility] on 8/2 and 8/9.   Mikey Bussing, DNP, AGPCNP-BC, AOCNP Mon/Tues/Thurs/Fri 7am-5pm; Off Wednesdays Cell: [phone]

## 2020-01-19 NOTE — Progress Notes (Signed)
PROGRESS NOTE  CHICO CAWOOD IWP:[id number] DOB: [birth year] DOA: 01/15/2020 PCP: [name], MD  Brief History   74 y.o.malewith medical history significant oftype 2 diabetes, hypertension, hyperlipidemia, COPD, chronic kidney disease, BPH, chronic indwelling Foley, peripheral vascular disease status post bypass 1 year ago, ongoing tobacco use, stage IIIa non-small cell cancer of the lung status post robotic assisted lobectomy on Oct 28, 2019 undergoing chemotherapy. Following his first round of chemotherapy he was admitted with enteritis thought to be related to his chemo, poor p.o. intake, lactic acidosis related to dehydration, and required TPN, antibiotics, NG tube placement. He was admitted for approximately 1 week for rehydration and electrolyte repletion. He had a second chemotherapy on 01/09/2020 and since that time has had weakness, poor appetite, and ongoing nonbloody diarrhea. He reports chronic lower left-sided abdominal pain. During his last admission he underwent CT scan and enteritis was noted. He does have a large ventral hernia related to his previous bypass.  ED Course:In the ED he was noted to be neutropenic with a WBC of 0.7, acute on chronic kidney injury with a creatinine of 1.46, and anion gap of 16, a low magnesium at 1.5, lactic acid of 3.2. He was given an IV fluid bolus as well as cefepime and Flagyl for presumed neutropenic fever. Urinalysis reveals moderate leuks and protein.  Consultants  . Medical Oncology  Procedures  . None  Antibiotics   Anti-infectives (From admission, onward)   Start     Dose/Rate Route Frequency Ordered Stop   01/17/20 1400  ceFEPIme (MAXIPIME) 2 g in sodium chloride 0.9 % 100 mL IVPB     Discontinue     2 g 200 mL/hr over 30 Minutes Intravenous Every 8 hours 01/17/20 1023     01/17/20 1300  fluconazole (DIFLUCAN) tablet 200 mg     Discontinue     200 mg Oral Daily 01/17/20 1135 01/31/20 0959   01/16/20 0800  ceFEPIme  (MAXIPIME) 2 g in sodium chloride 0.9 % 100 mL IVPB  Status:  Discontinued        2 g 200 mL/hr over 30 Minutes Intravenous Every 12 hours 01/15/20 2143 01/17/20 1023   01/16/20 0400  metroNIDAZOLE (FLAGYL) IVPB 500 mg     Discontinue     500 mg 100 mL/hr over 60 Minutes Intravenous Every 8 hours 01/15/20 2125     01/16/20 0345  ceFEPIme (MAXIPIME) 2 g in sodium chloride 0.9 % 100 mL IVPB  Status:  Discontinued        2 g 200 mL/hr over 30 Minutes Intravenous  Once 01/15/20 2125 01/16/20 0100   01/15/20 1945  ceFEPIme (MAXIPIME) 2 g in sodium chloride 0.9 % 100 mL IVPB        2 g 200 mL/hr over 30 Minutes Intravenous  Once 01/15/20 1936 01/15/20 2123   01/15/20 1945  metroNIDAZOLE (FLAGYL) IVPB 500 mg        500 mg 100 mL/hr over 60 Minutes Intravenous  Once 01/15/20 1936 01/15/20 2123     Subjective  The patient states that he only has one loose stool in the am now. He is feeling better.  Objective   Vitals:  Vitals:   01/19/20 0622 01/19/20 1529  BP: (!) 145/94 (!) 159/98  Pulse: 91 87  Resp: 20 18  Temp: (!) 97.5 F (36.4 C) 97.6 F (36.4 C)  SpO2: 94% 93%   Exam:  Constitutional:  . The patient is awake, alert, and oriented x 3. No acute  distress. Respiratory:  . No increased work of breathing. . No wheezes, rales, or rhonchi . No tactile fremitus Cardiovascular:  . Regular rate and rhythm . No murmurs, ectopy, or gallups. . No lateral PMI. No thrills. Abdomen:  . Abdomen is soft, non-tender, non-distended . No hernias, masses, or organomegaly . Normoactive bowel sounds.  Musculoskeletal:  . No cyanosis, clubbing, or edema Skin:  . No rashes, lesions, ulcers . palpation of skin: no induration or nodules Neurologic:  . CN 2-12 intact . Sensation all 4 extremities intact Psychiatric:  . Mental status o Mood, affect appropriate o Orientation to person, place, time  . judgment and insight appear intact  I have personally reviewed the following:    Today's Data  . Vitals, CMP, CBC  Micro Data  . Urine culture positive for yeast.  Scheduled Meds: . arformoterol  15 mcg Nebulization BID  . Chlorhexidine Gluconate Cloth  6 each Topical Daily  . feeding supplement (KATE [place]ARMS STANDARD 1.4)  325 mL Oral BID BM  . fluconazole  200 mg Oral Daily  . insulin aspart  0-9 Units Subcutaneous TID WC  . nicotine  21 mg Transdermal Daily  . tamsulosin  0.4 mg Oral QPC supper  . Tbo-filgastrim (GRANIX) SQ  480 mcg Subcutaneous q1800  . umeclidinium bromide  1 puff Inhalation Daily   Continuous Infusions: . ceFEPime (MAXIPIME) IV 2 g (01/19/20 1517)  . metronidazole 500 mg (01/19/20 1327)    Principal Problem:   Dehydration Active Problems:   Compulsive tobacco user syndrome   Atherosclerosis of native artery of both lower extremities with intermittent claudication (HCC)   Centrilobular emphysema (HCC)   Stage 4 very severe COPD by GOLD classification ([place])   Hypertension   Dyslipidemia   Type 2 diabetes mellitus with complication, without long-term current use of insulin (HCC)   Lung cancer, lingula (HCC)   Leukopenia due to antineoplastic chemotherapy (HCC)   Lactic acidosis   Chronic indwelling Foley catheter   Acute lower UTI   Protein-calorie malnutrition, severe   Hypomagnesemia   LOS: 4 days   A & P   Acute diarrhea: Likely secondary to chemotherapeutic agents.  CT scan showed findings suggestive of enteritis.  C. difficile testing was negative.  GI pathogen panel is pending.  Clinically no signs of obstruction.  Diarrhea seems to be subsiding.  Continue to monitor.  He does have some tenderness in the abdomen which is from his chronic ventral hernia.  Neutropenia: Improving. Unclear if the patient had fever at home or not.  Noted to have low-grade fevers here in the hospital.  With [place] of 200.  Started on Granix.  ANC is 1380 today.  Oncology wanted the patient to receive one more dose of Granix today. Continue to  monitor counts daily.    Acute renal failure: BUN was 37 along with a creatinine of 1.46 at admission.  Most likely due to volume loss from diarrhea.  IV fluids were initiated.  Renal function continues to improve.  Creatinine is down to 0.83.    Hypokalemia: Resolved. Low potassium is due to GI loss.  Monitor.  Lactic acidosis: Probably due to hypovolemia.  Noted to be on Metformin at home which could have contributed as well.  Improved.  Sepsis, present on admission/urinary tract infection: Resolved.  Patient was tachycardic, tachypneic with neutropenia.  Cultures are pending.  Continue cefepime and metronidazole for now.  Cultures are pending.  No growth so far.  Urine culture with 80,000 colonies  of yeast.  Patient has a chronic indwelling Foley.  Neutropenic.  The patient is receiving fluconazole 200 mg daily.  Stage IIIa non-small cell lung cancer: Followed by Dr. Julien Nordmann.  Receiving chemotherapy in the outpatient setting.  Chest x-ray did not show any acute findings.  Diabetes mellitus type 2: On Metformin at home which is held especially due to lactic acidosis.  Monitor CBGs.  SSI.  Normocytic anemia and thrombocytopenia: Most likely due to chemotherapy.  Hemoglobin slightly lower today but stable for the most part.  Platelet count is also lower today.  Stop heparin products.  Severe protein calorie malnutrition: Previously has required TPN.  For now we will monitor his oral intake.  Advance his diet.  Stage IV COPD by Gold classification: Respiratory status appears to be stable.  Continue inhalers.  Ongoing tobacco abuse: Nicotine patch.  Counseled.  Essential hypertension: Holding his antihypertensive medication including amlodipine and indapamide.  Blood pressure is reasonably well controlled.  Dyslipidemia: Resume his home medications when more stable.  History of peripheral vascular disease: Stable.  Incidental cholelithiasis: Noted on CT scan.  Outpatient  management.  Chronic indwelling Foley: Continue routine Foley care.  I have seen and examined this patient myself. I have spent 32 minutes in his evaluation and care.  DVT Prophylaxis: Lovenox will be stopped due to low platelets today.  SCDs. Code Status: Full code Family Communication: Discussed with the patient Disposition Plan: Hopefully will be able to return home when improved.  PT and OT evaluation.  Status is: Inpatient  Remains inpatient appropriate because:IV treatments appropriate due to intensity of illness or inability to take PO and Inpatient level of care appropriate due to severity of illness   Dispo:             Patient From: Home             Planned Disposition: Home             Expected discharge date: 01/20/20             Medically stable for discharge: No  Aaniyah Strohm, DO [facility] Direct contact: see [email or web address]  7PM-7AM contact night coverage as above 01/18/2020, 5:48 PM  LOS: 3 days

## 2020-01-19 NOTE — Research (Signed)
01/19/2020  Research - [facility] [phone] study  INTEGRATION OF IMMUNOTHERAPY INTO ADJUVANT THERAPY FOR RESECTED NSCLC: ALCHEMIST CHEMO-IO (ACCIO)  Adverse Event Causality - Cycle 2 expedited reporting  Event (CTCAE 5.0 Grade) NSCLC [place] Cisplatin [place]  Diarrhea (3) Unlikely Possible Probable Probable  Dehydration (3) Unlikely Possible Probable Probable  Small bowel enteritis (3) Possible Possible Unlikely Possible  Hematologic AEs (4) (neutropenia, leukopenia, lymphopenia) Unrelated Unlikely Probable Probable  Malnutrition (3) Possible Possible Possible Possible   Cindy S. Brigitte Pulse BSN, RN, [place] 01/19/2020 1:27 PM

## 2020-01-20 DIAGNOSIS — E860 Dehydration: Secondary | ICD-10-CM | POA: Diagnosis not present

## 2020-01-20 LAB — COMPREHENSIVE METABOLIC PANEL
ALT: 14 U/L (ref 0–44)
AST: 16 U/L (ref 15–41)
Albumin: 2.3 g/dL — ABNORMAL LOW (ref 3.5–5.0)
Alkaline Phosphatase: 53 U/L (ref 38–126)
Anion gap: 8 (ref 5–15)
BUN: 25 mg/dL — ABNORMAL HIGH (ref 8–23)
CO2: 27 mmol/L (ref 22–32)
Calcium: 8.8 mg/dL — ABNORMAL LOW (ref 8.9–10.3)
Chloride: 106 mmol/L (ref 98–111)
Creatinine, Ser: 0.73 mg/dL (ref 0.61–1.24)
GFR calc Af Amer: >60 mL/min (ref >60)
GFR calc non Af Amer: >60 mL/min (ref >60)
Glucose, Bld: 103 mg/dL — ABNORMAL HIGH (ref 70–99)
Potassium: 3.2 mmol/L — ABNORMAL LOW (ref 3.5–5.1)
Sodium: 141 mmol/L (ref 135–145)
Total Bilirubin: 0.6 mg/dL (ref 0.3–1.2)
Total Protein: 4.6 g/dL — ABNORMAL LOW (ref 6.5–8.1)

## 2020-01-20 LAB — CBC WITH DIFFERENTIAL/PLATELET
Abs Immature Granulocytes: 0.2 10*3/uL — ABNORMAL HIGH (ref 0.00–0.07)
Band Neutrophils: 4 %
Basophils Absolute: 0 10*3/uL (ref 0.0–0.1)
Basophils Relative: 0 %
Eosinophils Absolute: 0 10*3/uL (ref 0.0–0.5)
Eosinophils Relative: 1 %
HCT: 33.3 % — ABNORMAL LOW (ref 39.0–52.0)
Hemoglobin: 11.1 g/dL — ABNORMAL LOW (ref 13.0–17.0)
Lymphocytes Relative: 16 %
Lymphs Abs: 0.6 10*3/uL — ABNORMAL LOW (ref 0.7–4.0)
MCH: 31 pg (ref 26.0–34.0)
MCHC: 33.3 g/dL (ref 30.0–36.0)
MCV: 93 fL (ref 80.0–100.0)
Metamyelocytes Relative: 2 %
Monocytes Absolute: 0.3 10*3/uL (ref 0.1–1.0)
Monocytes Relative: 7 %
Myelocytes: 3 %
Neutro Abs: 2.8 10*3/uL (ref 1.7–7.7)
Neutrophils Relative %: 67 %
Platelets: 49 10*3/uL — ABNORMAL LOW (ref 150–400)
RBC: 3.58 MIL/uL — ABNORMAL LOW (ref 4.22–5.81)
RDW: 13.1 % (ref 11.5–15.5)
WBC: 4 10*3/uL (ref 4.0–10.5)
nRBC: 0 % (ref 0.0–0.2)

## 2020-01-20 LAB — GLUCOSE, CAPILLARY
Glucose-Capillary: 110 mg/dL — ABNORMAL HIGH (ref 70–99)
Glucose-Capillary: 95 mg/dL (ref 70–99)

## 2020-01-20 LAB — CULTURE, BLOOD (ROUTINE X 2)
Culture: NO GROWTH
Culture: NO GROWTH
Special Requests: ADEQUATE

## 2020-01-20 MED ORDER — KATE FARMS STANDARD 1.4 PO LIQD: 325.0000 mL | Freq: Two times a day (BID) | ORAL | 0 refills | Status: DC

## 2020-01-20 MED ORDER — TRAMADOL HCL 50 MG PO TABS: 50.0000 mg | ORAL_TABLET | Freq: Four times a day (QID) | ORAL | 0 refills | Status: DC | PRN

## 2020-01-20 MED ORDER — FLUCONAZOLE 200 MG PO TABS: 200.0000 mg | ORAL_TABLET | Freq: Every day | ORAL | 0 refills | Status: DC

## 2020-01-20 MED ORDER — NICOTINE 21 MG/24HR TD PT24: 21.0000 mg | MEDICATED_PATCH | Freq: Every day | TRANSDERMAL | 0 refills | Status: DC

## 2020-01-20 NOTE — Discharge Summary (Signed)
Physician Discharge Summary  Chad Gomez KGY:[id number] DOB: 22-Aug-1945 DOA: 01/15/2020  PCP: [name], MD  Admit date: 01/15/2020 Discharge date: 01/20/2020  Recommendations for Outpatient Follow-up:  1. Discharge to home 2. Follow up with PCP in 7-10 days. Have chemistry and CBC checked at that visit. 3. Follow up with Dr. Lorenso Courier of oncology as directed. 4. No driving while taking ultram.  Discharge Diagnoses: Principal diagnosis is #1 1. Acute on chronic diarrhea 2. Leukopenia 3. AKI 4. UTI 5. IIIa NSCLCA 6. DM II 7. Normocytic anemia 8. Thrombocytopenia 9. Severe Protein Calorie Malnutrition  Discharge Condition: Fair  Disposition: Home  Diet recommendation: Heart healthy  Filed Weights   01/16/20 0052 01/18/20 0615 01/19/20 1529  Weight: 70.4 kg 73.1 kg 73.1 kg    History of present illness:  Chad Gomez is a 74 y.o. male with medical history significant of type 2 diabetes, hypertension, hyperlipidemia, COPD, chronic kidney disease, BPH, chronic indwelling Foley, peripheral vascular disease status post bypass 1 year ago, ongoing tobacco use, stage IIIa non-small cell cancer of the lung status post robotic assisted lobectomy on Oct 28, 2019 undergoing chemotherapy.  Following his first round of chemotherapy he was admitted with enteritis thought to be related to his chemo, poor p.o. intake, lactic acidosis related to dehydration, and required TPN, antibiotics, NG tube placement.  He was admitted for approximately 1 week for rehydration and electrolyte repletion.  He had a second chemotherapy on 01/09/2020 and since that time has had weakness, poor appetite, and ongoing nonbloody diarrhea.  He reports chronic lower left-sided abdominal pain.  During his last admission he underwent CT scan and enteritis was noted.  He does have a large ventral hernia related to his previous bypass.  ED Course: In the ED he was noted to be neutropenic with a WBC of 0.7, acute on  chronic kidney injury with a creatinine of 1.46, and anion gap of 16, a low magnesium at 1.5, lactic acid of 3.2.  He was given an IV fluid bolus as well as cefepime and Flagyl for presumed neutropenic fever.  Urinalysis reveals moderate leuks and protein.  Hospital Course:  [facility] hospitalists were consulted to admit the patient for further evaluation and treatment. He was admitted to a medical bed. Oncology was consulted. The patien was treated with with Granix. He was started on IV Cefepime. Urine culture was positive for yeast. He was started on diflucan. The patient received 3 days of Granix. Diarrhea resolved. Today the patient was feeling better. He was discharged to home in fair condition.  Today's assessment: S: The patient was resting comfortably. No new complaints. O: Vitals:  Vitals:   01/20/20 0522 01/20/20 0913  BP: (!) 149/96   Pulse: 88   Resp: 18   Temp: 97.7 F (36.5 C)   SpO2: 92% 94%   Constitutional:   The patient is awake, alert, and oriented x 3. No acute distress. Respiratory:   No increased work of breathing.  No wheezes, rales, or rhonchi  No tactile fremitus Cardiovascular:   Regular rate and rhythm  No murmurs, ectopy, or gallups.  No lateral PMI. No thrills. Abdomen:   Abdomen is soft, non-tender, non-distended  No hernias, masses, or organomegaly  Normoactive bowel sounds.  Musculoskeletal:   No cyanosis, clubbing, or edema Skin:   No rashes, lesions, ulcers  palpation of skin: no induration or nodules Neurologic:   CN 2-12 intact  Sensation all 4 extremities intact Psychiatric:   Mental status ? Mood,  affect appropriate ? Orientation to person, place, time   judgment and insight appear intact  Discharge Instructions  Discharge Instructions    Activity as tolerated - No restrictions   Complete by: As directed    Call MD for:  persistant nausea and vomiting   Complete by: As directed    Call MD for:  severe  uncontrolled pain   Complete by: As directed    Diet - low sodium heart healthy   Complete by: As directed    Discharge instructions   Complete by: As directed    Discharge to home Follow up with PCP in 7-10 days. Have chemistry and CBC checked at that visit. Follow up with Dr. Lorenso Courier of oncology as directed. No driving while taking ultram.   Increase activity slowly   Complete by: As directed      Allergies as of 01/20/2020      Reactions   Morphine And Related Anxiety, Other (See Comments)   Combative, incoherent     Tape Other (See Comments), Rash   Surgical tape Adhesive tape causes blisters   Amoxicillin Other (See Comments)   Severe stomach pain   Naproxen Swelling   Eyes swell      Medication List    TAKE these medications   acetaminophen 500 MG tablet Commonly known as: TYLENOL Take 2 tablets (1,000 mg total) by mouth every 6 (six) hours as needed for mild pain.   albuterol 108 (90 Base) MCG/ACT inhaler Commonly known as: VENTOLIN HFA Inhale 2 puffs into the lungs every 6 (six) hours as needed for wheezing or shortness of breath.   amLODipine 10 MG tablet Commonly known as: NORVASC Take 10 mg by mouth daily.   aspirin EC 81 MG tablet Take 81 mg by mouth daily.   atorvastatin 40 MG tablet Commonly known as: LIPITOR Take 40 mg by mouth daily.   cetirizine 10 MG tablet Commonly known as: ZYRTEC Take 10 mg by mouth daily.   ezetimibe 10 MG tablet Commonly known as: ZETIA Take 5 mg by mouth daily.   feeding supplement (KATE FARMS STANDARD 1.4) Liqd liquid Take 325 mLs by mouth 2 (two) times daily between meals.   finasteride 5 MG tablet Commonly known as: PROSCAR Take 5 mg by mouth daily.   fluconazole 200 MG tablet Commonly known as: DIFLUCAN Take 1 tablet (200 mg total) by mouth daily for 10 days. Start taking on: January 21, 2020   fluticasone 50 MCG/ACT nasal spray Commonly known as: FLONASE Place 2 sprays into both nostrils daily.   folic  acid 1 MG tablet Commonly known as: FOLVITE Take 1 mg by mouth daily.   indapamide 1.25 MG tablet Commonly known as: LOZOL Take 1.25 mg by mouth daily.   K-Phos-Neutral 155-852-130 MG Tabs Take 2 tablets by mouth in the morning and at bedtime.   magnesium oxide 400 (241.3 Mg) MG tablet Commonly known as: MAG-OX Take 1 tablet (400 mg total) by mouth 2 (two) times daily.   metFORMIN 500 MG 24 hr tablet Commonly known as: GLUCOPHAGE-XR Take 500-1,000 mg by mouth See admin instructions. Take 500 mg every morning and take 1000 mg at supper   nicotine 21 mg/24hr patch Commonly known as: NICODERM CQ - dosed in mg/24 hours Place 1 patch (21 mg total) onto the skin daily. Start taking on: January 21, 2020   potassium citrate 10 MEQ (1080 MG) SR tablet Commonly known as: UROCIT-K Take 10 mEq by mouth in the morning and at  bedtime.   prochlorperazine 10 MG tablet Commonly known as: COMPAZINE Take 1 tablet (10 mg total) by mouth every 6 (six) hours as needed.   tamsulosin 0.4 MG Caps capsule Commonly known as: FLOMAX Take 0.4 mg by mouth daily after supper.   Tiotropium Bromide-Olodaterol 2.5-2.5 MCG/ACT Aers Commonly known as: Stiolto Respimat Inhale 2 puffs into the lungs daily.   traMADol 50 MG tablet Commonly known as: ULTRAM Take 1 tablet (50 mg total) by mouth every 6 (six) hours as needed (mild pain).   Vitamin D-3 25 MCG (1000 UT) Caps Take 1,000 Units by mouth daily.      Allergies  Allergen Reactions  . Morphine And Related Anxiety and Other (See Comments)    Combative, incoherent    . Tape Other (See Comments) and Rash    Surgical tape Adhesive tape causes blisters  . Amoxicillin Other (See Comments)    Severe stomach pain  . Naproxen Swelling    Eyes swell    The results of significant diagnostics from this hospitalization (including imaging, microbiology, ancillary and laboratory) are listed below for reference.    Significant Diagnostic Studies: DG  Chest 2 View  Result Date: 01/15/2020 CLINICAL DATA:  Dyspnea EXAM: CHEST - 2 VIEW COMPARISON:  01/01/2020 FINDINGS: Previously noted PICC line has been removed. The lungs are mildly hyperinflated, in keeping with changes of underlying COPD, unchanged from prior examination. No superimposed focal pulmonary infiltrate. Stable nodule within the left lung base laterally, indeterminate. Surgical staple line noted at the left hilum. No pneumothorax or pleural effusion. Cardiac size within normal limits. No acute bone abnormality. IMPRESSION: No active cardiopulmonary disease. COPD. Stable nodule at the left lung base laterally. This could be better assessed with CT examination once the patient's acute issues have resolved. Electronically Signed   By: Fidela Salisbury MD   On: 01/15/2020 17:52   DG Chest 2 View  Result Date: 12/26/2019 CLINICAL DATA:  Congestion and cough this week. EXAM: CHEST - 2 VIEW COMPARISON:  December 08, 2019 FINDINGS: The heart size and mediastinal contours are within normal limits. Both lungs are clear. The visualized skeletal structures are unremarkable. IMPRESSION: No active cardiopulmonary disease. Electronically Signed   By: Abelardo Diesel M.D.   On: 12/26/2019 11:04   CT ABDOMEN PELVIS W CONTRAST  Result Date: 01/15/2020 CLINICAL DATA:  74 year old male with abdominal pain. EXAM: CT ABDOMEN AND PELVIS WITH CONTRAST TECHNIQUE: Multidetector CT imaging of the abdomen and pelvis was performed using the standard protocol following bolus administration of intravenous contrast. CONTRAST:  116mL OMNIPAQUE IOHEXOL 300 MG/ML  SOLN COMPARISON:  CT abdomen pelvis dated 12/26/2019. FINDINGS: Lower chest: The visualized lung bases are clear. No intra-abdominal free air or free fluid. Hepatobiliary: Subcentimeter hypodense lesion in the right lobe of the liver inferiorly with a small internal enhancing focus similar to prior CT is not characterized. MRI may provide better characterization if clinically  indicated. There is mild intrahepatic biliary ductal dilatation. There are multiple stones within the gallbladder. There is mild haziness of the gallbladder wall with trace pericholecystic fluid. Ultrasound may provide better evaluation of the gallbladder if there is clinical concern for acute cholecystitis. Pancreas: Unremarkable. No pancreatic ductal dilatation or surrounding inflammatory changes. Spleen: Normal in size without focal abnormality. Adrenals/Urinary Tract: The right adrenal gland is unremarkable. There is a 1 cm indeterminate left adrenal nodule. There is severe right renal parenchyma atrophy and cortical thinning. There is no hydronephrosis on either side. There is symmetric enhancement and excretion  of contrast by both kidneys. Several subcentimeter bilateral renal hypodense lesions are too small to characterize. The visualized ureters appear unremarkable. The urinary bladder is decompressed around a Foley catheter. Apparent soft tissue density at the base of the bladder likely related to enlarged prostate gland with median lobe hypertrophy. Stomach/Bowel: There is diffuse long segment thickening and inflammatory changes of the small bowel loops throughout the abdomen consistent with enteritis. Overall the degree of inflammation is similar to prior CT. Multiple ventral hernias containing several loops of small bowel as seen on the prior CT. There is abutment of these loops of bowel to the anterior peritoneal wall compatible with adhesions. No definite evidence of bowel obstruction at this time. The appendix is unremarkable. Vascular/Lymphatic: There is advanced aortoiliac atherosclerotic disease. There is occlusion of the distal abdominal aorta and an aortobifemoral bypass graft. The bypass graft is patent. The SMV, splenic vein, and main portal vein are patent. No portal venous gas. There is no adenopathy. Reproductive: Enlarged prostate gland measuring approximately 5 cm in transverse axial  diameter. There is median lobe hypertrophy. Other: None Musculoskeletal: Bilateral L5 pars defects. No acute osseous pathology. IMPRESSION: 1. Diffuse thickening and inflammatory changes of normal caliber bowel loops may represent enteritis. Bowel ischemia is not entirely excluded. Clinical correlation is recommended. There is however no pneumatosis, free air, or portal venous gas. No definite evidence of obstruction. 2. Multiple ventral hernia is similar to prior CT containing segments of small bowel with findings of adhesions to the anterior peritoneal wall. 3. Cholelithiasis with mild haziness of the gallbladder wall. Ultrasound may provide better evaluation of the gallbladder if there is clinical concern for acute cholecystitis. 4. Occlusion of the distal abdominal aorta and an aortobifemoral bypass graft. The bypass graft is patent. 5. Aortic Atherosclerosis (ICD10-I70.0). Electronically Signed   By: Anner Crete M.D.   On: 01/15/2020 22:53   CT ABDOMEN PELVIS W CONTRAST  Result Date: 12/26/2019 CLINICAL DATA:  Acute abdomen pain.  History of lung cancer. EXAM: CT ABDOMEN AND PELVIS WITH CONTRAST TECHNIQUE: Multidetector CT imaging of the abdomen and pelvis was performed using the standard protocol following bolus administration of intravenous contrast. CONTRAST:  57mL OMNIPAQUE IOHEXOL 300 MG/ML  SOLN COMPARISON:  April 05, 2019 FINDINGS: Lower chest: Mild dependent atelectasis of right lung base is identified. The heart size is normal. Hepatobiliary: Stable enhancing low-density lesion is identified in the posterior segment right lobe liver unchanged compared prior CT. The liver is otherwise. Gallstones in gallbladder. No inflammation is noted surrounding the gallbladder. The biliary tree is normal. Pancreas: Unremarkable. No pancreatic ductal dilatation or surrounding inflammatory changes. Spleen: Normal in size without focal abnormality. Adrenals/Urinary Tract: 1 cm nodule is identified in the left  adrenal gland unchanged. The right adrenal gland is normal. There is atrophy of the right kidney unchanged. There left kidney cyst. There is no hydronephrosis bilaterally. Foley catheter is identified in a decompressed bladder. Stomach/Bowel: There are multiple thick wall mild dilated small bowel loops in the mid and lower abdomen probably as a result of partial bowel obstruction due to herniated small bowel loops in the midline 10 cm anterior abdominal hernia. Colon is normal. The appendix is not seen but no inflammation is noted around cecum. Vascular/Lymphatic: Aortic atherosclerosis. No enlarged abdominal or pelvic lymph nodes. Reproductive: Enlarged prostate with calcifications are identified. Other: Midline anterior abdominal hernia containing small bowel are identified as previously described. Subcutaneous air is identified in the anterior right abdominal and pelvic subcutaneous fat, source indeterminate.  Clinical correlation regarding recent injection in the area is suggested. There is no free air in the abdomen and pelvis. Musculoskeletal: Degenerative joint changes of the spine are noted. IMPRESSION: 1. Multiple thick wall mild dilated small bowel loops in the mid and lower abdomen probably as a result of partial bowel obstruction due to herniated small bowel loops in the midline anterior abdominal hernia. 2. Subcutaneous air is identified in the anterior right abdominal and pelvic subcutaneous fat, source indeterminate. Clinical correlation regarding recent injection in the area is suggested. There is no free air in the abdomen and pelvis. 3. Aortic atherosclerosis. These results were called by telephone at the time of interpretation on 12/26/2019 at 12:57 pm to provider [facility] , who verbally acknowledged these results. Aortic Atherosclerosis (ICD10-I70.0). Electronically Signed   By: Abelardo Diesel M.D.   On: 12/26/2019 13:01   DG CHEST PORT 1 VIEW  Result Date: 01/01/2020 CLINICAL DATA:   74 year old male with nonfunctioning right PICC line. Query positioning. EXAM: PORTABLE CHEST 1 VIEW COMPARISON:  12/26/2019 chest radiographs and earlier. FINDINGS: Portable AP semi upright view at 2331 hours. New right side PICC line. The tip is at the level of the confluence of the right innominate vein and SVC, just above the level of the aortic arch. The line might be twisted or kinked near the skin site at the right arm (arrow). Stable lung volumes and mediastinal contours with tortuous, calcified thoracic aorta. Visualized tracheal air column is within normal limits. No pneumothorax, pulmonary edema or pleural effusion. Mildly increased right lung base patchy opacity. Stable ventilation elsewhere. IMPRESSION: 1. Right side PICC line tip at the confluence of the right innominate vein and SVC. Note that the line might be twisted or kinked near the skin site at the right arm. 2. Increased right lung base patchy opacity since 12/26/2019. Consider atelectasis or infection. Electronically Signed   By: Genevie Ann M.D.   On: 01/01/2020 23:44   [place] EKG SITE RITE  Result Date: 12/27/2019 If Site Rite image not attached, placement could not be confirmed due to current cardiac rhythm.   Microbiology: Recent Results (from the past 240 hour(s))  Novel Coronavirus, NAA ([facility])     Status: None   Collection Time: 01/11/20 12:05 PM   Specimen: Oropharyngeal(OP) collection in vial transport medium   Oropharyngea  Testing  Result Value Ref Range Status   SARS-CoV-2, NAA Not Detected Not Detected Final    Comment: This nucleic acid amplification test was developed and its performance characteristics determined by [employer]. Nucleic acid amplification tests include RT-PCR and TMA. This test has not been FDA cleared or approved. This test has been authorized by FDA under an Emergency Use Authorization (EUA). This test is only authorized for the duration of time the declaration that circumstances  exist justifying the authorization of the emergency use of in vitro diagnostic tests for detection of SARS-CoV-2 virus and/or diagnosis of COVID-19 infection under section 564(b)(1) of the Act, 21 U.S.C. 462VOJ-5(K) (1), unless the authorization is terminated or revoked sooner. When diagnostic testing is negative, the possibility of a false negative result should be considered in the context of a patient's recent exposures and the presence of clinical signs and symptoms consistent with COVID-19. An individual without symptoms of COVID-19 and who is not shedding SARS-CoV-2 virus wo uld expect to have a negative (not detected) result in this assay.   SARS-COV-2, NAA 2 DAY TAT     Status: None   Collection Time: 01/11/20  12:05 PM   Oropharyngea  Testing  Result Value Ref Range Status   SARS-CoV-2, NAA 2 DAY TAT Performed  Final  Gastrointestinal Panel by PCR , Stool     Status: None   Collection Time: 01/15/20  5:26 PM   Specimen: Stool  Result Value Ref Range Status   Campylobacter species NOT DETECTED NOT DETECTED Final   Plesimonas shigelloides NOT DETECTED NOT DETECTED Final   Salmonella species NOT DETECTED NOT DETECTED Final   Yersinia enterocolitica NOT DETECTED NOT DETECTED Final   Vibrio species NOT DETECTED NOT DETECTED Final   Vibrio cholerae NOT DETECTED NOT DETECTED Final   Enteroaggregative E coli (EAEC) NOT DETECTED NOT DETECTED Final   Enteropathogenic E coli (EPEC) NOT DETECTED NOT DETECTED Final   Enterotoxigenic E coli (ETEC) NOT DETECTED NOT DETECTED Final   Shiga like toxin producing E coli (STEC) NOT DETECTED NOT DETECTED Final   Shigella/Enteroinvasive E coli (EIEC) NOT DETECTED NOT DETECTED Final   Cryptosporidium NOT DETECTED NOT DETECTED Final   Cyclospora cayetanensis NOT DETECTED NOT DETECTED Final   Entamoeba histolytica NOT DETECTED NOT DETECTED Final   Giardia lamblia NOT DETECTED NOT DETECTED Final   Adenovirus F40/41 NOT DETECTED NOT DETECTED Final    Astrovirus NOT DETECTED NOT DETECTED Final   Norovirus GI/GII NOT DETECTED NOT DETECTED Final   Rotavirus A NOT DETECTED NOT DETECTED Final   Sapovirus (I, II, IV, and V) NOT DETECTED NOT DETECTED Final    Comment: Performed at [facility], [place] [place], [place] [address]  C Difficile Quick Screen w PCR reflex     Status: None   Collection Time: 01/15/20  5:26 PM   Specimen: Stool  Result Value Ref Range Status   C Diff antigen NEGATIVE NEGATIVE Final   C Diff toxin NEGATIVE NEGATIVE Final   C Diff interpretation No C. difficile detected.  Final    Comment: VALID Performed at [facility], [place] [address], [place], [place] [address]   Culture, blood (routine x 2)     Status: None   Collection Time: 01/15/20  6:13 PM   Specimen: BLOOD  Result Value Ref Range Status   Specimen Description   Final    BLOOD RIGHT ANTECUBITAL Performed at [facility] [address] [place], [place] [address]    Special Requests   Final    BOTTLES DRAWN AEROBIC AND ANAEROBIC Blood Culture adequate volume Performed at [facility] [address] [place], [place] [address]    Culture   Final    NO GROWTH 5 DAYS Performed at [facility], [place] [address], [place], [place] [address]    Report Status 01/20/2020 FINAL  Final  Culture, blood (routine x 2)     Status: None   Collection Time: 01/15/20  6:13 PM   Specimen: BLOOD RIGHT FOREARM  Result Value Ref Range Status   Specimen Description   Final    BLOOD RIGHT FOREARM Performed at [facility], [place] [address] [place], [place] [address]    Special Requests   Final    BOTTLES DRAWN AEROBIC AND ANAEROBIC Blood Culture results may not be optimal due to an inadequate volume of blood received in culture bottles Performed at [facility] [address] [place], [place] [address]    Culture   Final    NO GROWTH 5 DAYS Performed at [facility], [place],  [place] [address]    Report Status 01/20/2020 FINAL  Final  Urine culture     Status: Abnormal   Collection Time: 01/15/20  6:13 PM   Specimen: Urine, Catheterized  Result Value Ref Range Status   Specimen Description   Final    URINE, CATHETERIZED Performed at [facility] [address], [place], [place] [address]    Special Requests   Final    Immunocompromised Performed at [facility], [place] [address] [place], [place] [address]    Culture 80,000 COLONIES/mL YEAST (A)  Final   Report Status 01/16/2020 FINAL  Final  SARS Coronavirus 2 by RT PCR (hospital order, performed in [facility] hospital lab) Nasopharyngeal Nasopharyngeal Swab     Status: None   Collection Time: 01/15/20  6:13 PM   Specimen: Nasopharyngeal Swab  Result Value Ref Range Status   SARS Coronavirus 2 NEGATIVE NEGATIVE Final    Comment: (NOTE) SARS-CoV-2 target nucleic acids are NOT DETECTED.  The SARS-CoV-2 RNA is generally detectable in upper and lower respiratory specimens during the acute phase of infection. The lowest concentration of SARS-CoV-2 viral copies this assay can detect is 250 copies / mL. A negative result does not preclude SARS-CoV-2 infection and should not be used as the sole basis for treatment or other patient management decisions.  A negative result may occur with improper specimen collection / handling, submission of specimen other than nasopharyngeal swab, presence of viral mutation(s) within the areas targeted by this assay, and inadequate number of viral copies (<250 copies / mL). A negative result must be combined with clinical observations, patient history, and epidemiological information.  Fact Sheet for Patients:   StrictlyIdeas.no  Fact Sheet for Healthcare Providers: BankingDealers.co.za  This test is not yet approved or  cleared by the [place]  FDA and has been authorized for detection and/or diagnosis of SARS-CoV-2 by FDA under an Emergency Use Authorization (EUA).  This EUA will remain in effect (meaning this test can be used) for the duration of the COVID-19 declaration under Section 564(b)(1) of the Act, 21 U.S.C. section 360bbb-3(b)(1), unless the authorization is terminated or revoked sooner.  Performed at [facility], [place] [address], [place], [place] [address]      Labs: Basic Metabolic Panel: Recent Labs  Lab 01/15/20 1704 01/15/20 1813 01/16/20 0728 01/17/20 0554 01/18/20 0553 01/19/20 0547 01/20/20 0554  NA   < >  --  137 138 142 146* 141  K   < >  --  3.3* 3.1* 3.2* 3.6 3.2*  CL   < >  --  101 106 111 114* 106  CO2   < >  --  26 23 25 26 27   GLUCOSE   < >  --  161* 114* 147* 123* 103*  BUN   < >  --  32* 31* 30* 28* 25*  CREATININE   < >  --  1.12 0.99 0.81 0.80 0.73  CALCIUM   < >  --  8.0* 7.8* 8.2* 9.0 8.8*  MG  --  1.5* 1.6* 2.5*  --   --   --    < > = values in this interval not displayed.   Liver Function Tests: Recent Labs  Lab 01/16/20 0728 01/17/20 0554 01/18/20 0553 01/19/20 0547 01/20/20 0554  AST 16 15 14* 13* 16  ALT 18 17 14 14 14   ALKPHOS 45 43 42 49 53  BILITOT 0.7 0.7 0.5 0.5 0.6  PROT  4.8* 4.6* 4.8* 4.8* 4.6*  ALBUMIN 2.5* 2.2* 2.4* 2.3* 2.3*   Recent Labs  Lab 01/15/20 1704  LIPASE 22   No results for input(s): AMMONIA in the last 168 hours. CBC: Recent Labs  Lab 01/16/20 0728 01/17/20 0554 01/18/20 0553 01/19/20 0547 01/20/20 0554  WBC 0.4* 0.8* 1.4* 2.3* 4.0  NEUTROABS 0.2* 0.4* 0.9* 1.4* 2.8  HGB 12.3* 11.1* 11.7* 11.4* 11.1*  HCT 36.1* 33.6* 36.3* 34.6* 33.3*  MCV 91.9 93.1 95.0 94.5 93.0  PLT 120* 78* 54* 53* 49*   Cardiac Enzymes: No results for input(s): CKTOTAL, CKMB, CKMBINDEX, TROPONINI in the last 168 hours. BNP: BNP (last 3 results) No results for input(s): BNP in the last 8760 hours.  ProBNP (last 3 results) No  results for input(s): PROBNP in the last 8760 hours.  CBG: Recent Labs  Lab 01/19/20 1152 01/19/20 1712 01/19/20 2011 01/20/20 0736 01/20/20 1142  GLUCAP 118* 115* 123* 110* 95    Principal Problem:   Dehydration Active Problems:   Compulsive tobacco user syndrome   Atherosclerosis of native artery of both lower extremities with intermittent claudication (HCC)   Centrilobular emphysema (HCC)   Stage 4 very severe COPD by GOLD classification ([place])   Hypertension   Dyslipidemia   Type 2 diabetes mellitus with complication, without long-term current use of insulin (HCC)   Lung cancer, lingula (HCC)   Leukopenia due to antineoplastic chemotherapy (HCC)   Lactic acidosis   Chronic indwelling Foley catheter   Acute lower UTI   Protein-calorie malnutrition, severe   Hypomagnesemia   Time coordinating discharge: 38 minutes.  Signed:        Keane Martelli, DO [facility]  01/20/2020, 7:26 PM

## 2020-01-20 NOTE — Care Management Important Message (Signed)
Important Message  Patient Details IM Letter given to Patient Name: Chad Gomez MRN: [id number] Date of Birth: 1946-04-04   Medicare Important Message Given:  Yes     Kerin Salen 01/20/2020, 11:32 AM

## 2020-01-23 ENCOUNTER — Other Ambulatory Visit: Payer: Self-pay | Admitting: *Deleted

## 2020-01-23 ENCOUNTER — Other Ambulatory Visit: Payer: Self-pay

## 2020-01-23 ENCOUNTER — Ambulatory Visit: Payer: Medicare Other | Admitting: *Deleted

## 2020-01-23 ENCOUNTER — Telehealth: Payer: Self-pay | Admitting: Medical Oncology

## 2020-01-23 ENCOUNTER — Inpatient Hospital Stay: Payer: Medicare Other | Attending: Physician Assistant

## 2020-01-23 DIAGNOSIS — R2689 Other abnormalities of gait and mobility: Secondary | ICD-10-CM | POA: Diagnosis not present

## 2020-01-23 DIAGNOSIS — N400 Enlarged prostate without lower urinary tract symptoms: Secondary | ICD-10-CM | POA: Insufficient documentation

## 2020-01-23 DIAGNOSIS — J449 Chronic obstructive pulmonary disease, unspecified: Secondary | ICD-10-CM | POA: Insufficient documentation

## 2020-01-23 DIAGNOSIS — Z9221 Personal history of antineoplastic chemotherapy: Secondary | ICD-10-CM | POA: Diagnosis not present

## 2020-01-23 DIAGNOSIS — E876 Hypokalemia: Secondary | ICD-10-CM | POA: Diagnosis not present

## 2020-01-23 DIAGNOSIS — C771 Secondary and unspecified malignant neoplasm of intrathoracic lymph nodes: Secondary | ICD-10-CM | POA: Diagnosis not present

## 2020-01-23 DIAGNOSIS — Z85820 Personal history of malignant melanoma of skin: Secondary | ICD-10-CM | POA: Insufficient documentation

## 2020-01-23 DIAGNOSIS — C3412 Malignant neoplasm of upper lobe, left bronchus or lung: Secondary | ICD-10-CM | POA: Insufficient documentation

## 2020-01-23 DIAGNOSIS — C3492 Malignant neoplasm of unspecified part of left bronchus or lung: Secondary | ICD-10-CM

## 2020-01-23 DIAGNOSIS — E785 Hyperlipidemia, unspecified: Secondary | ICD-10-CM | POA: Insufficient documentation

## 2020-01-23 DIAGNOSIS — Z7984 Long term (current) use of oral hypoglycemic drugs: Secondary | ICD-10-CM | POA: Insufficient documentation

## 2020-01-23 DIAGNOSIS — Z7982 Long term (current) use of aspirin: Secondary | ICD-10-CM | POA: Diagnosis not present

## 2020-01-23 DIAGNOSIS — E119 Type 2 diabetes mellitus without complications: Secondary | ICD-10-CM | POA: Diagnosis not present

## 2020-01-23 DIAGNOSIS — Z79899 Other long term (current) drug therapy: Secondary | ICD-10-CM | POA: Insufficient documentation

## 2020-01-23 DIAGNOSIS — N189 Chronic kidney disease, unspecified: Secondary | ICD-10-CM | POA: Diagnosis not present

## 2020-01-23 DIAGNOSIS — I10 Essential (primary) hypertension: Secondary | ICD-10-CM | POA: Diagnosis not present

## 2020-01-23 DIAGNOSIS — Z006 Encounter for examination for normal comparison and control in clinical research program: Secondary | ICD-10-CM | POA: Diagnosis present

## 2020-01-23 LAB — CBC WITH DIFFERENTIAL (CANCER CENTER ONLY)
Abs Immature Granulocytes: 0.99 10*3/uL — ABNORMAL HIGH (ref 0.00–0.07)
Basophils Absolute: 0.1 10*3/uL (ref 0.0–0.1)
Basophils Relative: 1 %
Eosinophils Absolute: 0.1 10*3/uL (ref 0.0–0.5)
Eosinophils Relative: 1 %
HCT: 34.9 % — ABNORMAL LOW (ref 39.0–52.0)
Hemoglobin: 12.1 g/dL — ABNORMAL LOW (ref 13.0–17.0)
Immature Granulocytes: 10 %
Lymphocytes Relative: 15 %
Lymphs Abs: 1.4 10*3/uL (ref 0.7–4.0)
MCH: 30.8 pg (ref 26.0–34.0)
MCHC: 34.7 g/dL (ref 30.0–36.0)
MCV: 88.8 fL (ref 80.0–100.0)
Monocytes Absolute: 0.5 10*3/uL (ref 0.1–1.0)
Monocytes Relative: 5 %
Neutro Abs: 6.6 10*3/uL (ref 1.7–7.7)
Neutrophils Relative %: 68 %
Platelet Count: 86 10*3/uL — ABNORMAL LOW (ref 150–400)
RBC: 3.93 MIL/uL — ABNORMAL LOW (ref 4.22–5.81)
RDW: 12.8 % (ref 11.5–15.5)
WBC Count: 9.6 10*3/uL (ref 4.0–10.5)
nRBC: 0 % (ref 0.0–0.2)

## 2020-01-23 LAB — CMP (CANCER CENTER ONLY)
ALT: 11 U/L (ref 0–44)
AST: 18 U/L (ref 15–41)
Albumin: 2.7 g/dL — ABNORMAL LOW (ref 3.5–5.0)
Alkaline Phosphatase: 102 U/L (ref 38–126)
Anion gap: 10 (ref 5–15)
BUN: 13 mg/dL (ref 8–23)
CO2: 30 mmol/L (ref 22–32)
Calcium: 9.6 mg/dL (ref 8.9–10.3)
Chloride: 99 mmol/L (ref 98–111)
Creatinine: 0.79 mg/dL (ref 0.61–1.24)
GFR, Est AFR Am: >60 mL/min (ref >60)
GFR, Estimated: >60 mL/min (ref >60)
Glucose, Bld: 138 mg/dL — ABNORMAL HIGH (ref 70–99)
Potassium: 2.5 mmol/L — CL (ref 3.5–5.1)
Sodium: 139 mmol/L (ref 135–145)
Total Bilirubin: 0.4 mg/dL (ref 0.3–1.2)
Total Protein: 4.9 g/dL — ABNORMAL LOW (ref 6.5–8.1)

## 2020-01-23 LAB — MAGNESIUM: Magnesium: 1.3 mg/dL — CL (ref 1.7–2.4)

## 2020-01-23 LAB — PHOSPHORUS: Phosphorus: 2 mg/dL — ABNORMAL LOW (ref 2.5–4.6)

## 2020-01-23 MED ORDER — POTASSIUM CHLORIDE CRYS ER 20 MEQ PO TBCR: 20.0000 meq | EXTENDED_RELEASE_TABLET | Freq: Two times a day (BID) | ORAL | 0 refills | Status: DC

## 2020-01-23 MED ORDER — MAGNESIUM OXIDE 400 (241.3 MG) MG PO TABS: 400.0000 mg | ORAL_TABLET | Freq: Two times a day (BID) | ORAL | 0 refills | Status: DC

## 2020-01-23 NOTE — Addendum Note (Signed)
Addended by: Ardeen Garland on: 01/23/2020 03:55 PM   Modules accepted: Orders

## 2020-01-23 NOTE — Patient Outreach (Addendum)
Chad Gomez) Care Management  01/23/2020  Chad Gomez [birth year] [id number]   Subjective: Telephone call to patient's home number, spoke with patient, and HIPAA verified.  Patient states he feels like he has been through the ringer and has been in the hospital 3 times within the last couple of months. States his wife is out running errands and he is in greement to a brief follow up with this RNCM.   States he saw his primary MD today, appointment went well, feels like he has his own pharmacy at home due to the number of medications he is taking,  will have a follow up appointment on 01/27/2020 to review all medications with primary MD, and MD is planning is discontinue any medication that he does not need.  States he will have chemotherapy on 01/30/2020.   States he has a prescription for a wheelchair, refused to have one ordered while he was in the hospital in the past, and is planning to follow up with local durable medical equipment vendor to obtain in the future.   Discussed how to search on line for local durable medical equipment vendors, patient voices understanding, and states he will follow up.   States he and his wife live in a trailer,  the wheelchair will have to be small to in in their home.  States he will contact RNCM if assistance needed with care coordination of the wheelchair.  States he may only need the wheelchair 10 % of the time and feels it will be beneficial to have it on hand if needed.  Patient states he does not have any education material, transition of care, care coordination, disease management, disease monitoring, transportation, community resource, or pharmacy needs at this time.  States he is very appreciative of the follow up, is in agreement  to receive 1 additional follow up call to assess for further CM needs, and is in agreement to receive [place] Management  information / services.     Objective:Per KPN (Knowledge Performance Now, point of care  tool) and chart review, patient hospitalized 01/15/2020 - 01/20/2020 for Acute diarrhea: Likely secondary to chemotherapeutic agents.   Patient hospitalized 12/26/2019 - 01/02/2020 for SBO (Small Bowel Obstruction).  10/28/2019 - 11/05/2019 for lung cancer, Non-small-cell lung cancer, left upper lobe lingula, status post Bronchoscopy, Robotic-assisted wedge resection of lingula with lymph node biopsy and intercostal nerve blocks, on 11/03/2019, status post VIDEO BRONCHOSCOPY WITH ENDOBRONCHIAL NAVIGATION WITH LUNG MARKINGS, XI ROBOTIC ASSISTED THORASCOPY, -Wedge Resection Lingula Left Upper Lobe, on 10/28/2019.  Patient has a history of BPH (benign prostatic hypertrophy), Chronic kidney disease, COPD (chronic obstructive pulmonary disease), diabetes, bladder stone, colon polyps, kidney stones, melanoma excision (left ear),  Hyperlipidemia, Hypertension, Peripheral vascular disease, Right ureteral stone, Shingles, chronic indwelling foley, Tobacco abuse, Nephrolithiasis, Centrilobular emphysema, Aortoiliac occlusive disease , and status post aortobifemoral bypass surgery on 05/26/2016. Patient's A1C was 6.2 on 06/07/2019.      Assessment: Received [employer] EMMI General Discharge Red Flag Alert follow up referral on 11/08/2019. EMMI Red Alert Trigger, Day #1, patient stated no to the following question: Got discharge papers?EMMI follow up completed and will follow up to assess further care management needs.       Plan:RNCM will discuss patient's case with [place] Management team during Multidisciplinary Case Discussion (MCD) on 01/26/2020.  RNCM will call patient's wife / designated party releaseand/ or patient, for telephone outreach attempt, within21business days, to assess for further CM needs, and proceed with  case closure, within 10 business days if no return call, after 4th unsuccessful call.     Chad Gomez, BSN, [facility] Telephonic  CM Phone: [phone] Fax: [phone]

## 2020-01-23 NOTE — Telephone Encounter (Addendum)
Hypokalemia I instructed  Stanton Kidney that [place] ordered  Kdur 40 meq daily x 7 days and sent it to [facility] .  Hypomagnesemia I  instructed Stanton Kidney to tell pt to increase mag oxide to 400 mg TID.  She said he does not need a rx for mag oxide.

## 2020-01-23 NOTE — Telephone Encounter (Signed)
K+ level  =2.5 today   He takes:  K+citrate 1080 meq BID (urocit-K) and  Kphos Neutral [id number] 2 tabs BID  Mag level =1.3  He takes Mag oxide 400 mg bid

## 2020-01-24 ENCOUNTER — Encounter: Payer: Self-pay | Admitting: Cardiology

## 2020-01-24 ENCOUNTER — Ambulatory Visit: Payer: Medicare Other | Admitting: Cardiology

## 2020-01-24 VITALS — BP 99/66 | HR 93 | Ht 70.0 in | Wt 154.6 lb

## 2020-01-24 DIAGNOSIS — E118 Type 2 diabetes mellitus with unspecified complications: Secondary | ICD-10-CM

## 2020-01-24 DIAGNOSIS — I10 Essential (primary) hypertension: Secondary | ICD-10-CM | POA: Diagnosis not present

## 2020-01-24 DIAGNOSIS — C3410 Malignant neoplasm of upper lobe, unspecified bronchus or lung: Secondary | ICD-10-CM | POA: Diagnosis not present

## 2020-01-24 NOTE — Patient Instructions (Signed)
Medication Instructions:  Stop: Amlodipine 10 mg daily Stop: Lozol 1.25 mg daily  *If you need a refill on your cardiac medications before your next appointment, please call your pharmacy*   Lab Work: None  Testing/Procedures: None   Follow-Up: At [facility], you and your health needs are our priority.  As part of our continuing mission to provide you with exceptional heart care, we have created designated Provider Care Teams.  These Care Teams include your primary Cardiologist (physician) and Advanced Practice Providers (APPs -  Physician Assistants and Nurse Practitioners) who all work together to provide you with the care you need, when you need it.  We recommend signing up for the patient portal called "MyChart".  Sign up information is provided on this After Visit Summary.  MyChart is used to connect with patients for Virtual Visits (Telemedicine).  Patients are able to view lab/test results, encounter notes, upcoming appointments, etc.  Non-urgent messages can be sent to your provider as well.   To learn more about what you can do with MyChart, go to [email or web address].    Your next appointment:   3-4 month(s)  The format for your next appointment:   In Person  Provider:   Minus Breeding, MD

## 2020-01-24 NOTE — Assessment & Plan Note (Addendum)
Now hypotensive secondary to chemo- stop Amlodipine and Lozal as he has had hypokalemia as well

## 2020-01-24 NOTE — Progress Notes (Signed)
Cardiology Office Note:    Date:  01/24/2020   ID:  Chad, Gomez 18-Nov-1945, MRN [id number]  PCP:  [name], MD  Cardiologist:  Minus Breeding, MD  Electrophysiologist:  None   Referring MD: [name], MD   CC: weakness-low B/P  History of Present Illness:    Chad Gomez is a 74 y.o. male with a hx of COPD and lung cancer.  He has had vascular disease in the past.  He was seen by Dr. Percival Spanish in April 2021 for preop clearance prior to lobectomy.  Myoview study was low risk and the patient underwent lobectomy in May 2021.  He is received 2 rounds of 4 of chemotherapy.  These have been rough for him.  He has been in the hospital twice since he started chemo.  He has had problems with electrolyte imbalance and dehydration.  His blood pressures been low.  He is being followed by Dr. Earlie Server.  Past Medical History:  Diagnosis Date  . BPH (benign prostatic hypertrophy)    weak stream  . Chronic kidney disease   . Complication of anesthesia    urinary retention  . COPD (chronic obstructive pulmonary disease) ([place])   . Diabetes mellitus without complication ([place])    Type II  . History of bladder stone   . History of colon polyps    08/2013   pre-canerous  . History of kidney stones   . History of melanoma excision    LEFT EAR  . Hyperlipidemia   . Hypertension   . Iron deficiency   . Lung cancer, lingula ([place]) 11/01/2019  . Open wound    left lower jaw; followed by ENT  . Peripheral vascular disease ([place])   . Right ureteral stone   . Shingles 05-15-2015    Past Surgical History:  Procedure Laterality Date  . ABDOMINAL AORTAGRAM  11/01/2014   Procedure: Abdominal Aortagram;  Surgeon: Serafina Mitchell, MD;  Location: [facility];  Service: Cardiovascular;;  . AORTA - BILATERAL FEMORAL ARTERY BYPASS GRAFT N/A 12/28/2014   Procedure: AORTOBIFEMORAL BYPASS GRAFT;  Surgeon: Serafina Mitchell, MD;  Location: [facility];  Service: Vascular;  Laterality: N/A;  .  BRONCHIAL BRUSHINGS  09/06/2019   Procedure: BRONCHIAL BRUSHINGS;  Surgeon: Collene Gobble, MD;  Location: [facility] ENDOSCOPY;  Service: Pulmonary;;  left upper lobe nodule  . BRONCHIAL WASHINGS  09/06/2019   Procedure: BRONCHIAL WASHINGS;  Surgeon: Collene Gobble, MD;  Location[facility];  Service: Pulmonary;;  . COLONOSCOPY W/ POLYPECTOMY  09-13-2013  . CYSTOLITHALOPAXY OF BLADDER STONE/ TRANSRECTAL ULTRASOUND PROSTATE BX  08-19-2005  . CYSTOSCOPY W/ RETROGRADES Right 12/02/2012   Procedure: CYSTOSCOPY WITH RETROGRADE PYELOGRAM;  Surgeon: Malka So, MD;  Location[facility];  Service: Urology;  Laterality: Right;  . CYSTOSCOPY W/ RETROGRADES Bilateral 10/13/2013   Procedure: CYSTOSCOPY WITH RETROGRADE PYELOGRAM;  Surgeon: Irine Seal, MD;  Location: [facility];  Service: Urology;  Laterality: Bilateral;  . CYSTOSCOPY W/ URETERAL STENT PLACEMENT Left 04/11/2014   Procedure: CYSTOSCOPY WITH STENT REPLACEMENT;  Surgeon: Malka So, MD;  Location: [facility];  Service: Urology;  Laterality: Left;  . CYSTOSCOPY WITH LITHOLAPAXY Right 12/02/2012   Procedure: CYSTOSCOPY WITH stone extraction;  Surgeon: Malka So, MD;  Location[facility];  Service: Urology;  Laterality: Right;  . CYSTOSCOPY WITH LITHOLAPAXY Right 10/13/2013   Procedure: CYSTOSCOPY WITH LITHOLAPAXY, removal of urethral stone with basket;  Surgeon: Jenny Reichmann  Jeffie Pollock, MD;  Location: [facility];  Service: Urology;  Laterality: Right;  . CYSTOSCOPY WITH RETROGRADE PYELOGRAM, URETEROSCOPY AND STENT PLACEMENT Left 03/21/2014   Procedure: CYSTOSCOPY WITH RETROGRADE PYELOGRAM, AND LEFT STENT PLACEMENT;  Surgeon: Alexis Frock, MD;  Location: [facility];  Service: Urology;  Laterality: Left;  . CYSTOSCOPY WITH RETROGRADE PYELOGRAM, URETEROSCOPY AND STENT PLACEMENT  09/09/2016   Procedure: CYSTOSCOPY WITH RIGHT  RETROGRADE PYELOGRAM, URETEROSCOPY WITH LASER BASKET EXTRACTION  OF STONES  AND STENT PLACEMENT;  Surgeon: Irine Seal, MD;  Location: [facility];  Service: Urology;;  . Consuela Mimes WITH URETEROSCOPY Left 04/11/2014   Procedure: CYSTOSCOPY WITH URETEROSCOPY/ STONE EXTRACTION;  Surgeon: Malka So, MD;  Location: [facility];  Service: Urology;  Laterality: Left;  . FINE NEEDLE ASPIRATION  09/06/2019   Procedure: FINE NEEDLE ASPIRATION;  Surgeon: Collene Gobble, MD;  Location: [facility] ENDOSCOPY;  Service: Pulmonary;;  left upper lobe - lung  . HOLMIUM LASER APPLICATION Right 3/87/5643   Procedure: HOLMIUM LASER APPLICATION;  Surgeon: Malka So, MD;  Location: [facility];  Service: Urology;  Laterality: Right;  . HOLMIUM LASER APPLICATION Left 32/95/1884   Procedure: HOLMIUM LASER APPLICATION;  Surgeon: Malka So, MD;  Location: [facility];  Service: Urology;  Laterality: Left;  . HOLMIUM LASER APPLICATION Right 1/66/0630   Procedure: HOLMIUM LASER APPLICATION;  Surgeon: Irine Seal, MD;  Location: [facility];  Service: Urology;  Laterality: Right;  . INGUINAL HERNIA REPAIR Right 1954  . INTERCOSTAL NERVE BLOCK Left 10/28/2019   Procedure: Intercostal Nerve Block;  Surgeon: [name], MD;  Location: [place];  Service: Thoracic;  Laterality: Left;  . LITHOTRIPSY  2007 & 02/2013  . LUNG BIOPSY  09/06/2019   Procedure: LUNG BIOPSY;  Surgeon: Collene Gobble, MD;  Location: [facility] ENDOSCOPY;  Service: Pulmonary;;  left upper lob lung  . [place]  . MICROLARYNGOSCOPY Right 02/18/2016   Procedure: MICRO DIRECT LARYNGOSCOPY EXCISIONAL BIOPSY OF RIGHT VOCAL CORD MASS;  Surgeon: Leta Baptist, MD;  Location: [facility];  Service: ENT;  Laterality: Right;  . [place]   left ear  . MULTIPLE TOOTH EXTRACTIONS  1994  . NEPHROLITHOTOMY Left 01/27/2013   Procedure: NEPHROLITHOTOMY PERCUTANEOUS FIRST LOOK;  Surgeon: Malka So, MD;  Location: [facility];   Service: Urology;  Laterality: Left;  . NODE DISSECTION  10/28/2019   Procedure: Node Dissection;  Surgeon: [name], MD;  Location: [facility];  Service: Thoracic;;  . PERIPHERAL VASCULAR CATHETERIZATION N/A 11/01/2014   Procedure: Lower Extremity Angiography;  Surgeon: Serafina Mitchell, MD;  Location: [facility];  Service: Cardiovascular;  Laterality: N/A;  . URETEROSCOPY Right 12/02/2012   Procedure: RIGHT URETEROSCOPY STONE EXTRACTION WITH  STENT PLACEMENT;  Surgeon: Malka So, MD;  Location: [facility];  Service: Urology;  Laterality: Right;  Marland Kitchen VIDEO BRONCHOSCOPY  10/28/2019   VIDEO BRONCHOSCOPY WITH ENDOBRONCHIAL NAVIGATION WITH LUNG MARKINGS (N/A  . VIDEO BRONCHOSCOPY WITH ENDOBRONCHIAL NAVIGATION N/A 09/06/2019   Procedure: VIDEO BRONCHOSCOPY WITH ENDOBRONCHIAL NAVIGATION;  Surgeon: Collene Gobble, MD;  Location: [facility]OSCOPY;  Service: Pulmonary;  Laterality: N/A;  . VIDEO BRONCHOSCOPY WITH ENDOBRONCHIAL NAVIGATION N/A 10/28/2019   Procedure: VIDEO BRONCHOSCOPY WITH ENDOBRONCHIAL NAVIGATION WITH LUNG MARKINGS;  Surgeon: [name], MD;  Location: [place];  Service: Thoracic;  Laterality: N/A;  . WEDGE RESECTION  10/28/2019   LEFT  UPPER LOBE    Current Medications: Current Meds  Medication Sig  . acetaminophen (TYLENOL) 500 MG tablet Take 2 tablets (1,000 mg total) by mouth every 6 (six) hours as needed for mild pain.  Marland Kitchen albuterol (VENTOLIN HFA) 108 (90 Base) MCG/ACT inhaler Inhale 2 puffs into the lungs every 6 (six) hours as needed for wheezing or shortness of breath.  Marland Kitchen aspirin EC 81 MG tablet Take 81 mg by mouth daily.  Marland Kitchen atorvastatin (LIPITOR) 40 MG tablet Take 40 mg by mouth daily.  . cetirizine (ZYRTEC) 10 MG tablet Take 10 mg by mouth daily.  . Cholecalciferol (VITAMIN D-3) 1000 units CAPS Take 1,000 Units by mouth daily.   Marland Kitchen ezetimibe (ZETIA) 10 MG tablet Take 5 mg by mouth daily.  . finasteride (PROSCAR) 5 MG tablet Take 5 mg by mouth daily.   . fluconazole (DIFLUCAN) 200 MG tablet Take 1 tablet (200 mg total) by mouth daily for 10 days.  . fluticasone (FLONASE) 50 MCG/ACT nasal spray Place 2 sprays into both nostrils daily.   . folic acid (FOLVITE) 1 MG tablet Take 1 mg by mouth daily.  . K Phos Mono-Sod Phos Di & Mono (K-PHOS-NEUTRAL) [phone] MG TABS Take 2 tablets by mouth in the morning and at bedtime.  . magnesium oxide (MAG-OX) 400 (241.3 Mg) MG tablet Take 1 tablet (400 mg total) by mouth 2 (two) times daily.  . metFORMIN (GLUCOPHAGE-XR) 500 MG 24 hr tablet Take 500-1,000 mg by mouth See admin instructions. Take 500 mg every morning and take 1000 mg at supper  . nicotine (NICODERM CQ - DOSED IN MG/24 HOURS) 21 mg/24hr patch Place 1 patch (21 mg total) onto the skin daily.  . Nutritional Supplements (FEEDING SUPPLEMENT, KATE FARMS STANDARD 1.4,) LIQD liquid Take 325 mLs by mouth 2 (two) times daily between meals.  . potassium chloride SA (KLOR-CON) 20 MEQ tablet Take 1 tablet (20 mEq total) by mouth 2 (two) times daily.  . potassium citrate (UROCIT-K) 10 MEQ (1080 MG) SR tablet Take 10 mEq by mouth in the morning and at bedtime.   . prochlorperazine (COMPAZINE) 10 MG tablet Take 1 tablet (10 mg total) by mouth every 6 (six) hours as needed.  . tamsulosin (FLOMAX) 0.4 MG CAPS capsule Take 0.4 mg by mouth daily after supper.   . Tiotropium Bromide-Olodaterol (STIOLTO RESPIMAT) 2.5-2.5 MCG/ACT AERS Inhale 2 puffs into the lungs daily.  . traMADol (ULTRAM) 50 MG tablet Take 1 tablet (50 mg total) by mouth every 6 (six) hours as needed (mild pain).  . [DISCONTINUED] amLODipine (NORVASC) 10 MG tablet Take 10 mg by mouth daily.  . [DISCONTINUED] indapamide (LOZOL) 1.25 MG tablet Take 1.25 mg by mouth daily.     Allergies:   Morphine and related, Tape, Amoxicillin, and Naproxen   Social History   Socioeconomic History  . Marital status: Married    Spouse name: Not on file  . Number of children: 2  . Years of education: Not on  file  . Highest education level: Not on file  Occupational History  . Not on file  Tobacco Use  . Smoking status: Current Every Day Smoker    Packs/day: 1.00    Years: 59.00    Pack years: 59.00    Types: Cigarettes    Start date: 06/24/1959  . Smokeless tobacco: Never Used  Vaping Use  . Vaping Use: Former  Substance and Sexual Activity  . Alcohol use: No    Alcohol/week: 0.0 standard drinks  .  Drug use: No  . Sexual activity: Not on file  Other Topics Concern  . Not on file  Social History Narrative   Patient is married with 2 children.   Patient is right handed.   Patient has some college education.   Patient does not drink caffeine.   Social Determinants of Health   Financial Resource Strain:   . Difficulty of Paying Living Expenses:   Food Insecurity:   . Worried About Charity fundraiser in the Last Year:   . Arboriculturist in the Last Year:   Transportation Needs: No Transportation Needs  . Lack of Transportation (Medical): No  . Lack of Transportation (Non-Medical): No  Physical Activity:   . Days of Exercise per Week:   . Minutes of Exercise per Session:   Stress:   . Feeling of Stress :   Social Connections:   . Frequency of Communication with Friends and Family:   . Frequency of Social Gatherings with Friends and Family:   . Attends Religious Services:   . Active Member of Clubs or Organizations:   . Attends Archivist Meetings:   Marland Kitchen Marital Status:      Family History: The patient's family history includes Dementia in his father and mother; Hypertension in his mother and sister; Stroke (age of onset: 6) in his mother. There is no history of Colon cancer.  ROS:   Please see the history of present illness.     All other systems reviewed and are negative.  EKGs/Labs/Other Studies Reviewed:    The following studies were reviewed today: Myoview 10/21/2019-  There was no ST segment deviation noted during stress.  Nuclear stress EF:  75%.  The left ventricular ejection fraction is hyperdynamic (>65%).  The study is normal.  This is a low risk study.   EKG:  EKG is ordered today.  The ekg ordered today demonstrates NSR-HR 98  Recent Labs: 12/15/2019: TSH 1.788 01/23/2020: ALT 11; BUN 13; Creatinine 0.79; Hemoglobin 12.1; Magnesium 1.3; Platelet Count 86; Potassium 2.5; Sodium 139  Recent Lipid Panel    Component Value Date/Time   TRIG 137 01/02/2020 0459    Physical Exam:    VS:  BP 99/66   Pulse 93   Ht 5\' 10"  (1.778 m)   Wt 154 lb 9.6 oz (70.1 kg)   SpO2 97%   BMI 22.18 kg/m     Wt Readings from Last 3 Encounters:  01/24/20 154 lb 9.6 oz (70.1 kg)  01/19/20 161 lb 2.5 oz (73.1 kg)  01/09/20 156 lb 12.8 oz (71.1 kg)     GEN: frail caucasian male, chronically ill appearing in wheelchair, in no acute distress HEENT: Normal NECK: No JVD; No carotid bruits CARDIAC: RRR, no murmurs, rubs, gallops RESPIRATORY:  Clear to auscultation but diminished overall, without rales, wheezing or rhonchi  ABDOMEN: Soft, non-distended MUSCULOSKELETAL:  No edema; No deformity  SKIN: pale, warm and dry NEUROLOGIC:  Alert and oriented x 3 PSYCHIATRIC:  Normal affect   ASSESSMENT:    Lung cancer, lingula ([place]) Lobectomy May 2021 followed by chemotherapy  Hypertension Now hypotensive secondary to chemo- stop Amlodipine and Lozal as he has had hypokalemia as well  Type 2 diabetes mellitus with complication, without long-term current use of insulin (HCC) On Glucophage  PLAN:    Stop Amlodipine and Lozal for now.  He has two more rounds of chemotherapy.  I would continue ASA 81 mg if possible with his history of PVD and  coronary Ca++ on CT scan.  I will defer holding his Lipitor and Zetia to Dr Shelia Media. F/U with Dr Percival Spanish 3-4 months.   Medication Adjustments/Labs and Tests Ordered: Current medicines are reviewed at length with the patient today.  Concerns regarding medicines are outlined above.  No orders of the  defined types were placed in this encounter.  No orders of the defined types were placed in this encounter.   There are no Patient Instructions on file for this visit.   Angelena Form, PA-C  01/24/2020 11:47 AM    [facility]

## 2020-01-24 NOTE — Assessment & Plan Note (Signed)
Lobectomy May 2021 followed by chemotherapy

## 2020-01-24 NOTE — Assessment & Plan Note (Signed)
On Glucophage

## 2020-01-24 NOTE — Addendum Note (Signed)
Addended by: Meryl Crutch on: 01/24/2020 03:03 PM   Modules accepted: Orders

## 2020-01-25 ENCOUNTER — Other Ambulatory Visit: Payer: Self-pay | Admitting: *Deleted

## 2020-01-25 DIAGNOSIS — J309 Allergic rhinitis, unspecified: Secondary | ICD-10-CM | POA: Diagnosis not present

## 2020-01-25 DIAGNOSIS — I10 Essential (primary) hypertension: Secondary | ICD-10-CM | POA: Diagnosis not present

## 2020-01-25 DIAGNOSIS — E11319 Type 2 diabetes mellitus with unspecified diabetic retinopathy without macular edema: Secondary | ICD-10-CM | POA: Diagnosis not present

## 2020-01-25 DIAGNOSIS — C3492 Malignant neoplasm of unspecified part of left bronchus or lung: Secondary | ICD-10-CM

## 2020-01-25 DIAGNOSIS — E876 Hypokalemia: Secondary | ICD-10-CM | POA: Diagnosis not present

## 2020-01-26 ENCOUNTER — Ambulatory Visit: Payer: Self-pay | Admitting: *Deleted

## 2020-01-26 ENCOUNTER — Other Ambulatory Visit: Payer: Self-pay | Admitting: *Deleted

## 2020-01-26 NOTE — Patient Outreach (Signed)
[facility]) Care Management  01/26/2020  Chad Gomez Feb 03, 1946 [id number]   Patient's case discussed at  Multidisciplinary Case Discussion (MCD) with [place] Management Team and with health plan contact Chad Gomez).  Discussion recommendations: RNCM will advise patient and / or patient's wife/ designated party release of the [place] support group, if patient in agreement to a referral RNCM will update plan contact Chad Gomez), and Olivia Mackie will refer patient to support group.  RNCM will follow up to assess further care management needs at next patient outreach.  Plan: Update team as needed.    Nylen Creque H. Annia Friendly, BSN, [place] Management [facility] Telephonic CM Phone: [phone] Fax: [phone]

## 2020-01-30 ENCOUNTER — Inpatient Hospital Stay: Payer: Medicare Other

## 2020-01-30 ENCOUNTER — Other Ambulatory Visit: Payer: Self-pay

## 2020-01-30 ENCOUNTER — Encounter: Payer: Self-pay | Admitting: Internal Medicine

## 2020-01-30 ENCOUNTER — Inpatient Hospital Stay: Payer: Medicare Other | Admitting: Nutrition

## 2020-01-30 ENCOUNTER — Encounter: Payer: Self-pay | Admitting: *Deleted

## 2020-01-30 ENCOUNTER — Inpatient Hospital Stay: Payer: Medicare Other | Admitting: Internal Medicine

## 2020-01-30 VITALS — BP 110/83 | HR 107 | Temp 97.7°F | Resp 18 | Wt 152.1 lb

## 2020-01-30 DIAGNOSIS — C3490 Malignant neoplasm of unspecified part of unspecified bronchus or lung: Secondary | ICD-10-CM

## 2020-01-30 DIAGNOSIS — E43 Unspecified severe protein-calorie malnutrition: Secondary | ICD-10-CM | POA: Diagnosis not present

## 2020-01-30 DIAGNOSIS — R2689 Other abnormalities of gait and mobility: Secondary | ICD-10-CM | POA: Diagnosis not present

## 2020-01-30 DIAGNOSIS — I10 Essential (primary) hypertension: Secondary | ICD-10-CM | POA: Diagnosis not present

## 2020-01-30 DIAGNOSIS — Z006 Encounter for examination for normal comparison and control in clinical research program: Secondary | ICD-10-CM | POA: Diagnosis not present

## 2020-01-30 DIAGNOSIS — Z7982 Long term (current) use of aspirin: Secondary | ICD-10-CM | POA: Diagnosis not present

## 2020-01-30 DIAGNOSIS — Z5111 Encounter for antineoplastic chemotherapy: Secondary | ICD-10-CM

## 2020-01-30 DIAGNOSIS — Z7984 Long term (current) use of oral hypoglycemic drugs: Secondary | ICD-10-CM | POA: Diagnosis not present

## 2020-01-30 DIAGNOSIS — C3492 Malignant neoplasm of unspecified part of left bronchus or lung: Secondary | ICD-10-CM

## 2020-01-30 DIAGNOSIS — Z5112 Encounter for antineoplastic immunotherapy: Secondary | ICD-10-CM

## 2020-01-30 DIAGNOSIS — C3410 Malignant neoplasm of upper lobe, unspecified bronchus or lung: Secondary | ICD-10-CM

## 2020-01-30 DIAGNOSIS — E785 Hyperlipidemia, unspecified: Secondary | ICD-10-CM | POA: Diagnosis not present

## 2020-01-30 DIAGNOSIS — Z79899 Other long term (current) drug therapy: Secondary | ICD-10-CM | POA: Diagnosis not present

## 2020-01-30 DIAGNOSIS — N189 Chronic kidney disease, unspecified: Secondary | ICD-10-CM | POA: Diagnosis not present

## 2020-01-30 DIAGNOSIS — C771 Secondary and unspecified malignant neoplasm of intrathoracic lymph nodes: Secondary | ICD-10-CM | POA: Diagnosis not present

## 2020-01-30 DIAGNOSIS — J449 Chronic obstructive pulmonary disease, unspecified: Secondary | ICD-10-CM | POA: Diagnosis not present

## 2020-01-30 DIAGNOSIS — E876 Hypokalemia: Secondary | ICD-10-CM | POA: Diagnosis not present

## 2020-01-30 DIAGNOSIS — Z9221 Personal history of antineoplastic chemotherapy: Secondary | ICD-10-CM | POA: Diagnosis not present

## 2020-01-30 DIAGNOSIS — Z85820 Personal history of malignant melanoma of skin: Secondary | ICD-10-CM | POA: Diagnosis not present

## 2020-01-30 DIAGNOSIS — E119 Type 2 diabetes mellitus without complications: Secondary | ICD-10-CM | POA: Diagnosis not present

## 2020-01-30 DIAGNOSIS — C3412 Malignant neoplasm of upper lobe, left bronchus or lung: Secondary | ICD-10-CM | POA: Diagnosis not present

## 2020-01-30 LAB — CMP (CANCER CENTER ONLY)
ALT: 24 U/L (ref 0–44)
AST: 24 U/L (ref 15–41)
Albumin: 2.7 g/dL — ABNORMAL LOW (ref 3.5–5.0)
Alkaline Phosphatase: 103 U/L (ref 38–126)
Anion gap: 9 (ref 5–15)
BUN: 7 mg/dL — ABNORMAL LOW (ref 8–23)
CO2: 30 mmol/L (ref 22–32)
Calcium: 10 mg/dL (ref 8.9–10.3)
Chloride: 99 mmol/L (ref 98–111)
Creatinine: 0.77 mg/dL (ref 0.61–1.24)
GFR, Est AFR Am: >60 mL/min (ref >60)
GFR, Estimated: >60 mL/min (ref >60)
Glucose, Bld: 112 mg/dL — ABNORMAL HIGH (ref 70–99)
Potassium: 4 mmol/L (ref 3.5–5.1)
Sodium: 138 mmol/L (ref 135–145)
Total Bilirubin: 0.5 mg/dL (ref 0.3–1.2)
Total Protein: 5.4 g/dL — ABNORMAL LOW (ref 6.5–8.1)

## 2020-01-30 LAB — CBC WITH DIFFERENTIAL (CANCER CENTER ONLY)
Abs Immature Granulocytes: 0.1 10*3/uL — ABNORMAL HIGH (ref 0.00–0.07)
Basophils Absolute: 0 10*3/uL (ref 0.0–0.1)
Basophils Relative: 0 %
Eosinophils Absolute: 0.1 10*3/uL (ref 0.0–0.5)
Eosinophils Relative: 1 %
HCT: 34.2 % — ABNORMAL LOW (ref 39.0–52.0)
Hemoglobin: 11.6 g/dL — ABNORMAL LOW (ref 13.0–17.0)
Immature Granulocytes: 1 %
Lymphocytes Relative: 11 %
Lymphs Abs: 1.2 10*3/uL (ref 0.7–4.0)
MCH: 31.2 pg (ref 26.0–34.0)
MCHC: 33.9 g/dL (ref 30.0–36.0)
MCV: 91.9 fL (ref 80.0–100.0)
Monocytes Absolute: 1 10*3/uL (ref 0.1–1.0)
Monocytes Relative: 10 %
Neutro Abs: 8.2 10*3/uL — ABNORMAL HIGH (ref 1.7–7.7)
Neutrophils Relative %: 77 %
Platelet Count: 309 10*3/uL (ref 150–400)
RBC: 3.72 MIL/uL — ABNORMAL LOW (ref 4.22–5.81)
RDW: 14.2 % (ref 11.5–15.5)
WBC Count: 10.6 10*3/uL — ABNORMAL HIGH (ref 4.0–10.5)
nRBC: 0 % (ref 0.0–0.2)

## 2020-01-30 LAB — RESEARCH LABS

## 2020-01-30 LAB — PHOSPHORUS: Phosphorus: 3 mg/dL (ref 2.5–4.6)

## 2020-01-30 LAB — TSH: TSH: 2.649 u[IU]/mL (ref 0.320–4.118)

## 2020-01-30 LAB — MAGNESIUM: Magnesium: 1.4 mg/dL — CL (ref 1.7–2.4)

## 2020-01-30 MED ORDER — MAGNESIUM SULFATE 4 GM/100ML IV SOLN
4.0000 g | Freq: Once | INTRAVENOUS | Status: AC
  Administered 2020-01-30: 4 g via INTRAVENOUS
  Filled 2020-01-30: qty 100

## 2020-01-30 MED ORDER — SODIUM CHLORIDE 0.9 % IV SOLN
INTRAVENOUS | Status: DC
  Filled 2020-01-30: qty 250

## 2020-01-30 NOTE — Patient Instructions (Signed)
Hypomagnesemia Hypomagnesemia is a condition in which the level of magnesium in the blood is low. Magnesium is a mineral that is found in many foods. It is used in many different processes in the body. Hypomagnesemia can affect every organ in the body. In severe cases, it can cause life-threatening problems. What are the causes? This condition may be caused by:  Not getting enough magnesium in your diet.  Malnutrition.  Problems with absorbing magnesium from the intestines.  Dehydration.  Alcohol abuse.  Vomiting.  Severe or chronic diarrhea.  Some medicines, including medicines that make you urinate more (diuretics).  Certain diseases, such as kidney disease, diabetes, celiac disease, and overactive thyroid. What are the signs or symptoms? Symptoms of this condition include:  Loss of appetite.  Nausea and vomiting.  Involuntary shaking or trembling of a body part (tremor).  Muscle weakness.  Tingling in the arms and legs.  Sudden tightening of muscles (muscle spasms).  Confusion.  Psychiatric issues, such as depression, irritability, or psychosis.  A feeling of fluttering of the heart.  Seizures. These symptoms are more severe if magnesium levels drop suddenly. How is this diagnosed? This condition may be diagnosed based on:  Your symptoms and medical history.  A physical exam.  Blood and urine tests. How is this treated? Treatment depends on the cause and the severity of the condition. It may be treated with:  A magnesium supplement. This can be taken in pill form. If the condition is severe, magnesium is usually given through an IV.  Changes to your diet. You may be directed to eat foods that have a lot of magnesium, such as green leafy vegetables, peas, beans, and nuts.  Stopping any intake of alcohol. Follow these instructions at home:      Make sure that your diet includes foods with magnesium. Foods that have a lot of magnesium in them  include: ? Green leafy vegetables, such as spinach and broccoli. ? Beans and peas. ? Nuts and seeds, such as almonds and sunflower seeds. ? Whole grains, such as whole grain bread and fortified cereals.  Take magnesium supplements if your health care provider tells you to do that. Take them as directed.  Take over-the-counter and prescription medicines only as told by your health care provider.  Have your magnesium levels monitored as told by your health care provider.  When you are active, drink fluids that contain electrolytes.  Avoid drinking alcohol.  Keep all follow-up visits as told by your health care provider. This is important. Contact a health care provider if:  You get worse instead of better.  Your symptoms return. Get help right away if you:  Develop severe muscle weakness.  Have trouble breathing.  Feel that your heart is racing. Summary  Hypomagnesemia is a condition in which the level of magnesium in the blood is low.  Hypomagnesemia can affect every organ in the body.  Treatment may include eating more foods that contain magnesium, taking magnesium supplements, and not drinking alcohol.  Have your magnesium levels monitored as told by your health care provider. This information is not intended to replace advice given to you by your health care provider. Make sure you discuss any questions you have with your health care provider. Document Revised: 05/22/2017 Document Reviewed: 05/11/2017 Elsevier Patient Education  2020 Elsevier Inc.  

## 2020-01-30 NOTE — Patient Instructions (Signed)
Steps to Quit Smoking Smoking tobacco is the leading cause of preventable death. It can affect almost every organ in the body. Smoking puts you and people around you at risk for many serious, long-lasting (chronic) diseases. Quitting smoking can be hard, but it is one of the best things that you can do for your health. It is never too late to quit. How do I get ready to quit? When you decide to quit smoking, make a plan to help you succeed. Before you quit:  Pick a date to quit. Set a date within the next 2 weeks to give you time to prepare.  Write down the reasons why you are quitting. Keep this list in places where you will see it often.  Tell your family, friends, and co-workers that you are quitting. Their support is important.  Talk with your doctor about the choices that may help you quit.  Find out if your health insurance will pay for these treatments.  Know the people, places, things, and activities that make you want to smoke (triggers). Avoid them. What first steps can I take to quit smoking?  Throw away all cigarettes at home, at work, and in your car.  Throw away the things that you use when you smoke, such as ashtrays and lighters.  Clean your car. Make sure to empty the ashtray.  Clean your home, including curtains and carpets. What can I do to help me quit smoking? Talk with your doctor about taking medicines and seeing a counselor at the same time. You are more likely to succeed when you do both.  If you are pregnant or breastfeeding, talk with your doctor about counseling or other ways to quit smoking. Do not take medicine to help you quit smoking unless your doctor tells you to do so. To quit smoking: Quit right away  Quit smoking totally, instead of slowly cutting back on how much you smoke over a period of time.  Go to counseling. You are more likely to quit if you go to counseling sessions regularly. Take medicine You may take medicines to help you quit. Some  medicines need a prescription, and some you can buy over-the-counter. Some medicines may contain a drug called nicotine to replace the nicotine in cigarettes. Medicines may:  Help you to stop having the desire to smoke (cravings).  Help to stop the problems that come when you stop smoking (withdrawal symptoms). Your doctor may ask you to use:  Nicotine patches, gum, or lozenges.  Nicotine inhalers or sprays.  Non-nicotine medicine that is taken by mouth. Find resources Find resources and other ways to help you quit smoking and remain smoke-free after you quit. These resources are most helpful when you use them often. They include:  Online chats with a counselor.  Phone quitlines.  Printed self-help materials.  Support groups or group counseling.  Text messaging programs.  Mobile phone apps. Use apps on your mobile phone or tablet that can help you stick to your quit plan. There are many free apps for mobile phones and tablets as well as websites. Examples include Quit Guide from the CDC and smokefree.gov  What things can I do to make it easier to quit?   Talk to your family and friends. Ask them to support and encourage you.  Call a phone quitline (1-800-QUIT-NOW), reach out to support groups, or work with a counselor.  Ask people who smoke to not smoke around you.  Avoid places that make you want to smoke,   such as: ? Bars. ? Parties. ? Smoke-break areas at work.  Spend time with people who do not smoke.  Lower the stress in your life. Stress can make you want to smoke. Try these things to help your stress: ? Getting regular exercise. ? Doing deep-breathing exercises. ? Doing yoga. ? Meditating. ? Doing a body scan. To do this, close your eyes, focus on one area of your body at a time from head to toe. Notice which parts of your body are tense. Try to relax the muscles in those areas. How will I feel when I quit smoking? Day 1 to 3 weeks Within the first 24 hours,  you may start to have some problems that come from quitting tobacco. These problems are very bad 2-3 days after you quit, but they do not often last for more than 2-3 weeks. You may get these symptoms:  Mood swings.  Feeling restless, nervous, angry, or annoyed.  Trouble concentrating.  Dizziness.  Strong desire for high-sugar foods and nicotine.  Weight gain.  Trouble pooping (constipation).  Feeling like you may vomit (nausea).  Coughing or a sore throat.  Changes in how the medicines that you take for other issues work in your body.  Depression.  Trouble sleeping (insomnia). Week 3 and afterward After the first 2-3 weeks of quitting, you may start to notice more positive results, such as:  Better sense of smell and taste.  Less coughing and sore throat.  Slower heart rate.  Lower blood pressure.  Clearer skin.  Better breathing.  Fewer sick days. Quitting smoking can be hard. Do not give up if you fail the first time. Some people need to try a few times before they succeed. Do your best to stick to your quit plan, and talk with your doctor if you have any questions or concerns. Summary  Smoking tobacco is the leading cause of preventable death. Quitting smoking can be hard, but it is one of the best things that you can do for your health.  When you decide to quit smoking, make a plan to help you succeed.  Quit smoking right away, not slowly over a period of time.  When you start quitting, seek help from your doctor, family, or friends. This information is not intended to replace advice given to you by your health care provider. Make sure you discuss any questions you have with your health care provider. Document Revised: 03/04/2019 Document Reviewed: 08/28/2018 Elsevier Patient Education  2020 [employer]  

## 2020-01-30 NOTE — Progress Notes (Signed)
[place] [phone]   Fax:([phone]) [place], MD [address] [place] [address]  DIAGNOSIS: stageIIIa(T2a, N2,M0)non-small cell lung cancer, adenocarcinoma. Patient presented with a left upper lobe nodule.He was diagnosed in March 2021  Biomarkers/PDL1:  Detected EGFR mutation in exon 21 ? Positive for the EGFR P848L mutation in exon 21. No mutation was detected in exons 12, 18, 19, and 20. The clinical significance of P848L in exon 21 is uncertain. This variant has been reported to behave like functionally silent polymorphism. [name], M.M., et al. Mol Cancer 6, 56 (2007). ? PD-L1 Expression Level: TPS < 1% PD-L1 Expression Status: No Expression NEGATIVE FOR ALK REARRANGEMENT  PRIOR THERAPY: robotic assisted wedge resection of the left upper lobe under the care of Dr. Servando Snare. This was performed on 10/28/2019.  CURRENT THERAPY: Adjuvant systemic chemotherapy on the [facility] A [id number] protocol with cisplatin 70 mg/M2, pemetrexed 500 mg/M2 every 3 weeks in addition to pembrolizumab 400 mg IV every 6 weeks. Start date December 19, 2019.  Status post 2 cycles.  INTERVAL HISTORY: MARQUET FAIRCLOTH 74 y.o. male returns to the clinic today for follow-up visit accompanied by his wife.  The patient is feeling fine today except for fatigue.  He was admitted to the hospital after cycle #2 with enteritis as well as acute renal insufficiency, urinary tract infection and leukopenia.  The patient improved with hydration.  He also has significant electrolyte abnormalities including hypokalemia, hypomagnesemia and hypophosphatemia that were replaced during his hospitalization.  The patient is here today for evaluation and recommendation regarding his condition.  He denied having any nausea, vomiting, diarrhea or constipation.  He denied having any headache or visual changes.  He has no weight loss or night  sweats.  He has no fever or chills.   MEDICAL HISTORY: Past Medical History:  Diagnosis Date  . BPH (benign prostatic hypertrophy)    weak stream  . Chronic kidney disease   . Complication of anesthesia    urinary retention  . COPD (chronic obstructive pulmonary disease) ([place])   . Diabetes mellitus without complication ([place])    Type II  . History of bladder stone   . History of colon polyps    08/2013   pre-canerous  . History of kidney stones   . History of melanoma excision    LEFT EAR  . Hyperlipidemia   . Hypertension   . Iron deficiency   . Lung cancer, lingula ([place]) 11/01/2019  . Open wound    left lower jaw; followed by ENT  . Peripheral vascular disease ([place])   . Right ureteral stone   . Shingles 05-15-2015    ALLERGIES:  is allergic to morphine and related, tape, amoxicillin, and naproxen.  MEDICATIONS:  Current Outpatient Medications  Medication Sig Dispense Refill  . acetaminophen (TYLENOL) 500 MG tablet Take 2 tablets (1,000 mg total) by mouth every 6 (six) hours as needed for mild pain. 30 tablet 0  . albuterol (VENTOLIN HFA) 108 (90 Base) MCG/ACT inhaler Inhale 2 puffs into the lungs every 6 (six) hours as needed for wheezing or shortness of breath. 8 g 3  . aspirin EC 81 MG tablet Take 81 mg by mouth daily.    Marland Kitchen atorvastatin (LIPITOR) 40 MG tablet Take 40 mg by mouth daily.    . cetirizine (ZYRTEC) 10 MG tablet Take 10 mg by mouth daily.    . Cholecalciferol (VITAMIN D-3)  1000 units CAPS Take 1,000 Units by mouth daily.     Marland Kitchen ezetimibe (ZETIA) 10 MG tablet Take 5 mg by mouth daily.    . finasteride (PROSCAR) 5 MG tablet Take 5 mg by mouth daily.    . fluconazole (DIFLUCAN) 200 MG tablet Take 1 tablet (200 mg total) by mouth daily for 10 days. 10 tablet 0  . fluticasone (FLONASE) 50 MCG/ACT nasal spray Place 2 sprays into both nostrils daily.     . folic acid (FOLVITE) 1 MG tablet Take 1 mg by mouth daily.    . K Phos Mono-Sod Phos Di & Mono  (K-PHOS-NEUTRAL) [phone] MG TABS Take 2 tablets by mouth in the morning and at bedtime. 120 tablet 0  . magnesium oxide (MAG-OX) 400 (241.3 Mg) MG tablet Take 1 tablet (400 mg total) by mouth 2 (two) times daily. 60 tablet 0  . metFORMIN (GLUCOPHAGE-XR) 500 MG 24 hr tablet Take 500-1,000 mg by mouth See admin instructions. Take 500 mg every morning and take 1000 mg at supper    . nicotine (NICODERM CQ - DOSED IN MG/24 HOURS) 21 mg/24hr patch Place 1 patch (21 mg total) onto the skin daily. 28 patch 0  . Nutritional Supplements (FEEDING SUPPLEMENT, KATE FARMS STANDARD 1.4,) LIQD liquid Take 325 mLs by mouth 2 (two) times daily between meals. 325 mL 0  . potassium chloride SA (KLOR-CON) 20 MEQ tablet Take 1 tablet (20 mEq total) by mouth 2 (two) times daily. 14 tablet 0  . potassium citrate (UROCIT-K) 10 MEQ (1080 MG) SR tablet Take 10 mEq by mouth in the morning and at bedtime.     . prochlorperazine (COMPAZINE) 10 MG tablet Take 1 tablet (10 mg total) by mouth every 6 (six) hours as needed. 30 tablet 2  . tamsulosin (FLOMAX) 0.4 MG CAPS capsule Take 0.4 mg by mouth daily after supper.     . Tiotropium Bromide-Olodaterol (STIOLTO RESPIMAT) 2.5-2.5 MCG/ACT AERS Inhale 2 puffs into the lungs daily. 1 Inhaler 11  . traMADol (ULTRAM) 50 MG tablet Take 1 tablet (50 mg total) by mouth every 6 (six) hours as needed (mild pain). 20 tablet 0   No current facility-administered medications for this visit.    SURGICAL HISTORY:  Past Surgical History:  Procedure Laterality Date  . ABDOMINAL AORTAGRAM  11/01/2014   Procedure: Abdominal Aortagram;  Surgeon: Serafina Mitchell, MD;  Location: [facility];  Service: Cardiovascular;;  . AORTA - BILATERAL FEMORAL ARTERY BYPASS GRAFT N/A 12/28/2014   Procedure: AORTOBIFEMORAL BYPASS GRAFT;  Surgeon: Serafina Mitchell, MD;  Location: [facility];  Service: Vascular;  Laterality: N/A;  . BRONCHIAL BRUSHINGS  09/06/2019   Procedure: BRONCHIAL BRUSHINGS;  Surgeon: Collene Gobble, MD;  Location: [facility] ENDOSCOPY;  Service: Pulmonary;;  left upper lobe nodule  . BRONCHIAL WASHINGS  09/06/2019   Procedure: BRONCHIAL WASHINGS;  Surgeon: Collene Gobble, MD;  Location: [facility]COPY;  Service: Pulmonary;;  . COLONOSCOPY W/ POLYPECTOMY  09-13-2013  . CYSTOLITHALOPAXY OF BLADDER STONE/ TRANSRECTAL ULTRASOUND PROSTATE BX  08-19-2005  . CYSTOSCOPY W/ RETROGRADES Right 12/02/2012   Procedure: CYSTOSCOPY WITH RETROGRADE PYELOGRAM;  Surgeon: Malka So, MD;  Location: [facility];  Service: Urology;  Laterality: Right;  . CYSTOSCOPY W/ RETROGRADES Bilateral 10/13/2013   Procedure: CYSTOSCOPY WITH RETROGRADE PYELOGRAM;  Surgeon: Irine Seal, MD;  Location: [facility];  Service: Urology;  Laterality: Bilateral;  . CYSTOSCOPY W/ URETERAL STENT PLACEMENT Left 04/11/2014   Procedure: CYSTOSCOPY  WITH STENT REPLACEMENT;  Surgeon: Malka So, MD;  Location: [facility];  Service: Urology;  Laterality: Left;  . CYSTOSCOPY WITH LITHOLAPAXY Right 12/02/2012   Procedure: CYSTOSCOPY WITH stone extraction;  Surgeon: Malka So, MD;  Location: [facility];  Service: Urology;  Laterality: Right;  . CYSTOSCOPY WITH LITHOLAPAXY Right 10/13/2013   Procedure: CYSTOSCOPY WITH LITHOLAPAXY, removal of urethral stone with basket;  Surgeon: Irine Seal, MD;  Location: [facility];  Service: Urology;  Laterality: Right;  . CYSTOSCOPY WITH RETROGRADE PYELOGRAM, URETEROSCOPY AND STENT PLACEMENT Left 03/21/2014   Procedure: CYSTOSCOPY WITH RETROGRADE PYELOGRAM, AND LEFT STENT PLACEMENT;  Surgeon: Alexis Frock, MD;  Location: [facility];  Service: Urology;  Laterality: Left;  . CYSTOSCOPY WITH RETROGRADE PYELOGRAM, URETEROSCOPY AND STENT PLACEMENT  09/09/2016   Procedure: CYSTOSCOPY WITH RIGHT  RETROGRADE PYELOGRAM, URETEROSCOPY WITH LASER BASKET EXTRACTION OF STONES  AND STENT PLACEMENT;  Surgeon: Irine Seal, MD;  Location: [facility];  Service: Urology;;  . Consuela Mimes WITH URETEROSCOPY Left 04/11/2014   Procedure: CYSTOSCOPY WITH URETEROSCOPY/ STONE EXTRACTION;  Surgeon: Malka So, MD;  Location: [facility];  Service: Urology;  Laterality: Left;  . FINE NEEDLE ASPIRATION  09/06/2019   Procedure: FINE NEEDLE ASPIRATION;  Surgeon: Collene Gobble, MD;  Location: [facility] ENDOSCOPY;  Service: Pulmonary;;  left upper lobe - lung  . HOLMIUM LASER APPLICATION Right 10/31/2583   Procedure: HOLMIUM LASER APPLICATION;  Surgeon: Malka So, MD;  Location: [facility];  Service: Urology;  Laterality: Right;  . HOLMIUM LASER APPLICATION Left 27/78/2423   Procedure: HOLMIUM LASER APPLICATION;  Surgeon: Malka So, MD;  Location: [facility];  Service: Urology;  Laterality: Left;  . HOLMIUM LASER APPLICATION Right 5/36/1443   Procedure: HOLMIUM LASER APPLICATION;  Surgeon: Irine Seal, MD;  Location: [facility]  Service: Urology;  Laterality: Right;  . INGUINAL HERNIA REPAIR Right 1954  . INTERCOSTAL NERVE BLOCK Left 10/28/2019   Procedure: Intercostal Nerve Block;  Surgeon: [name], MD;  Location: [place];  Service: Thoracic;  Laterality: Left;  . LITHOTRIPSY  2007 & 02/2013  . LUNG BIOPSY  09/06/2019   Procedure: LUNG BIOPSY;  Surgeon: Collene Gobble, MD;  Location: [facility] ENDOSCOPY;  Service: Pulmonary;;  left upper lob lung  . [place]  . MICROLARYNGOSCOPY Right 02/18/2016   Procedure: MICRO DIRECT LARYNGOSCOPY EXCISIONAL BIOPSY OF RIGHT VOCAL CORD MASS;  Surgeon: Leta Baptist, MD;  Location: [place];  Service: ENT;  Laterality: Right;  . [place]   left ear  . MULTIPLE TOOTH EXTRACTIONS  1994  . NEPHROLITHOTOMY Left 01/27/2013   Procedure: NEPHROLITHOTOMY PERCUTANEOUS FIRST LOOK;  Surgeon: Malka So, MD;  Location: [facility];  Service: Urology;  Laterality: Left;  . NODE DISSECTION  10/28/2019   Procedure: Node  Dissection;  Surgeon: [name], MD;  Location: [facility];  Service: Thoracic;;  . PERIPHERAL VASCULAR CATHETERIZATION N/A 11/01/2014   Procedure: Lower Extremity Angiography;  Surgeon: Serafina Mitchell, MD;  Location: [place] [facility];  Service: Cardiovascular;  Laterality: N/A;  . URETEROSCOPY Right 12/02/2012   Procedure: RIGHT URETEROSCOPY STONE EXTRACTION WITH  STENT PLACEMENT;  Surgeon: Malka So, MD;  Location: [facility];  Service: Urology;  Laterality: Right;  Marland Kitchen VIDEO BRONCHOSCOPY  10/28/2019   VIDEO BRONCHOSCOPY WITH ENDOBRONCHIAL NAVIGATION WITH LUNG MARKINGS (N/A  . VIDEO  BRONCHOSCOPY WITH ENDOBRONCHIAL NAVIGATION N/A 09/06/2019   Procedure: VIDEO BRONCHOSCOPY WITH ENDOBRONCHIAL NAVIGATION;  Surgeon: Collene Gobble, MD;  Location: [facility] ENDOSCOPY;  Service: Pulmonary;  Laterality: N/A;  . VIDEO BRONCHOSCOPY WITH ENDOBRONCHIAL NAVIGATION N/A 10/28/2019   Procedure: VIDEO BRONCHOSCOPY WITH ENDOBRONCHIAL NAVIGATION WITH LUNG MARKINGS;  Surgeon: [name], MD;  Location: [place];  Service: Thoracic;  Laterality: N/A;  . WEDGE RESECTION  10/28/2019   LEFT UPPER LOBE    REVIEW OF SYSTEMS:  Constitutional: positive for fatigue and weight loss Eyes: negative Ears, nose, mouth, throat, and face: negative Respiratory: negative Cardiovascular: negative Gastrointestinal: negative Genitourinary:negative Integument/breast: negative Hematologic/lymphatic: negative Musculoskeletal:negative Neurological: negative Behavioral/Psych: negative Endocrine: negative Allergic/Immunologic: negative   PHYSICAL EXAMINATION: General appearance: alert, cooperative, fatigued and no distress Head: Normocephalic, without obvious abnormality, atraumatic Neck: no adenopathy, no JVD, supple, symmetrical, trachea midline and thyroid not enlarged, symmetric, no tenderness/mass/nodules Lymph nodes: Cervical, supraclavicular, and axillary nodes normal. Resp: clear to auscultation  bilaterally Back: symmetric, no curvature. ROM normal. No CVA tenderness. Cardio: regular rate and rhythm, S1, S2 normal, no murmur, click, rub or gallop GI: soft, non-tender; bowel sounds normal; no masses,  no organomegaly Extremities: extremities normal, atraumatic, no cyanosis or edema Neurologic: Alert and oriented X 3, normal strength and tone. Normal symmetric reflexes. Normal coordination and gait  ECOG PERFORMANCE STATUS: 1 - Symptomatic but completely ambulatory  Blood pressure 110/83, pulse (!) 107, temperature 97.7 F (36.5 C), temperature source Tympanic, resp. rate 18, weight 152 lb 0.8 oz (69 kg), SpO2 99 %.  LABORATORY DATA: Lab Results  Component Value Date   WBC 10.6 (H) 01/30/2020   HGB 11.6 (L) 01/30/2020   HCT 34.2 (L) 01/30/2020   MCV 91.9 01/30/2020   PLT 309 01/30/2020      Chemistry      Component Value Date/Time   NA 139 01/23/2020 1107   K 2.5 (LL) 01/23/2020 1107   CL 99 01/23/2020 1107   CO2 30 01/23/2020 1107   BUN 13 01/23/2020 1107   CREATININE 0.79 01/23/2020 1107      Component Value Date/Time   CALCIUM 9.6 01/23/2020 1107   ALKPHOS 102 01/23/2020 1107   AST 18 01/23/2020 1107   ALT 11 01/23/2020 1107   BILITOT 0.4 01/23/2020 1107       RADIOGRAPHIC STUDIES: DG Chest 2 View  Result Date: 01/15/2020 CLINICAL DATA:  Dyspnea EXAM: CHEST - 2 VIEW COMPARISON:  01/01/2020 FINDINGS: Previously noted PICC line has been removed. The lungs are mildly hyperinflated, in keeping with changes of underlying COPD, unchanged from prior examination. No superimposed focal pulmonary infiltrate. Stable nodule within the left lung base laterally, indeterminate. Surgical staple line noted at the left hilum. No pneumothorax or pleural effusion. Cardiac size within normal limits. No acute bone abnormality. IMPRESSION: No active cardiopulmonary disease. COPD. Stable nodule at the left lung base laterally. This could be better assessed with CT examination once the  patient's acute issues have resolved. Electronically Signed   By: Fidela Salisbury MD   On: 01/15/2020 17:52   CT ABDOMEN PELVIS W CONTRAST  Result Date: 01/15/2020 CLINICAL DATA:  74 year old male with abdominal pain. EXAM: CT ABDOMEN AND PELVIS WITH CONTRAST TECHNIQUE: Multidetector CT imaging of the abdomen and pelvis was performed using the standard protocol following bolus administration of intravenous contrast. CONTRAST:  139m OMNIPAQUE IOHEXOL 300 MG/ML  SOLN COMPARISON:  CT abdomen pelvis dated 12/26/2019. FINDINGS: Lower chest: The visualized lung bases are clear. No intra-abdominal free air or  free fluid. Hepatobiliary: Subcentimeter hypodense lesion in the right lobe of the liver inferiorly with a small internal enhancing focus similar to prior CT is not characterized. MRI may provide better characterization if clinically indicated. There is mild intrahepatic biliary ductal dilatation. There are multiple stones within the gallbladder. There is mild haziness of the gallbladder wall with trace pericholecystic fluid. Ultrasound may provide better evaluation of the gallbladder if there is clinical concern for acute cholecystitis. Pancreas: Unremarkable. No pancreatic ductal dilatation or surrounding inflammatory changes. Spleen: Normal in size without focal abnormality. Adrenals/Urinary Tract: The right adrenal gland is unremarkable. There is a 1 cm indeterminate left adrenal nodule. There is severe right renal parenchyma atrophy and cortical thinning. There is no hydronephrosis on either side. There is symmetric enhancement and excretion of contrast by both kidneys. Several subcentimeter bilateral renal hypodense lesions are too small to characterize. The visualized ureters appear unremarkable. The urinary bladder is decompressed around a Foley catheter. Apparent soft tissue density at the base of the bladder likely related to enlarged prostate gland with median lobe hypertrophy. Stomach/Bowel: There is  diffuse long segment thickening and inflammatory changes of the small bowel loops throughout the abdomen consistent with enteritis. Overall the degree of inflammation is similar to prior CT. Multiple ventral hernias containing several loops of small bowel as seen on the prior CT. There is abutment of these loops of bowel to the anterior peritoneal wall compatible with adhesions. No definite evidence of bowel obstruction at this time. The appendix is unremarkable. Vascular/Lymphatic: There is advanced aortoiliac atherosclerotic disease. There is occlusion of the distal abdominal aorta and an aortobifemoral bypass graft. The bypass graft is patent. The SMV, splenic vein, and main portal vein are patent. No portal venous gas. There is no adenopathy. Reproductive: Enlarged prostate gland measuring approximately 5 cm in transverse axial diameter. There is median lobe hypertrophy. Other: None Musculoskeletal: Bilateral L5 pars defects. No acute osseous pathology. IMPRESSION: 1. Diffuse thickening and inflammatory changes of normal caliber bowel loops may represent enteritis. Bowel ischemia is not entirely excluded. Clinical correlation is recommended. There is however no pneumatosis, free air, or portal venous gas. No definite evidence of obstruction. 2. Multiple ventral hernia is similar to prior CT containing segments of small bowel with findings of adhesions to the anterior peritoneal wall. 3. Cholelithiasis with mild haziness of the gallbladder wall. Ultrasound may provide better evaluation of the gallbladder if there is clinical concern for acute cholecystitis. 4. Occlusion of the distal abdominal aorta and an aortobifemoral bypass graft. The bypass graft is patent. 5. Aortic Atherosclerosis (ICD10-I70.0). Electronically Signed   By: Anner Crete M.D.   On: 01/15/2020 22:53   DG CHEST PORT 1 VIEW  Result Date: 01/01/2020 CLINICAL DATA:  74 year old male with nonfunctioning right PICC line. Query positioning.  EXAM: PORTABLE CHEST 1 VIEW COMPARISON:  12/26/2019 chest radiographs and earlier. FINDINGS: Portable AP semi upright view at 2331 hours. New right side PICC line. The tip is at the level of the confluence of the right innominate vein and SVC, just above the level of the aortic arch. The line might be twisted or kinked near the skin site at the right arm (arrow). Stable lung volumes and mediastinal contours with tortuous, calcified thoracic aorta. Visualized tracheal air column is within normal limits. No pneumothorax, pulmonary edema or pleural effusion. Mildly increased right lung base patchy opacity. Stable ventilation elsewhere. IMPRESSION: 1. Right side PICC line tip at the confluence of the right innominate vein and SVC. Note that  the line might be twisted or kinked near the skin site at the right arm. 2. Increased right lung base patchy opacity since 12/26/2019. Consider atelectasis or infection. Electronically Signed   By: Genevie Ann M.D.   On: 01/01/2020 23:44    ASSESSMENT AND PLAN: This is a very pleasant 74 years old white male recently diagnosed with stage IIIa (T2 a, N2, M0) non-small cell lung cancer, adenocarcinoma presented with left upper lobe lung nodule and after resection the patient was found to have metastatic disease to the mediastinal lymphadenopathy.  This was diagnosed in March 2021.   He is currently undergoing adjuvant systemic chemotherapy with cisplatin, pemetrexed and pembrolizumab status post 2 cycles. The patient has significant intolerability to this treatment with frequent admission to the hospital with enteritis, acute renal failure as well as pancytopenia. I had a lengthy discussion with the patient today about his current condition and treatment options. I do not think the patient will be a candidate to continue this form of treatment at this point. I recommended for him to discontinue the adjuvant chemotherapy and immunotherapy. I will see him back for follow-up visit in  3 months for evaluation and repeat CT scan of the chest for restaging of his disease. For the electrolyte imbalance, we will continue to monitor it closely and replace it as needed. The patient was advised to call immediately if he has any concerning symptoms in the interval. The patient voices understanding of current disease status and treatment options and is in agreement with the current care plan. All questions were answered. The patient knows to call the clinic with any problems, questions or concerns. We can certainly see the patient much sooner if necessary.  The total time spent in the appointment was 30 minutes.  Disclaimer: This note was dictated with voice recognition software. Similar sounding words can inadvertently be transcribed and may not be corrected upon review.

## 2020-01-30 NOTE — Research (Signed)
01/30/2020   Research - [facility] [id number] research study - End of Treatment visit  Patient into clinic for evaluation today accompanied by his wife.   Patient Questionnaire booklet - Completed by patient independently upon arrival to clinic.   Routine labs - Hematology and chemistry labs obtained due to recent abnormalities and follow-up at end of treatment. Research labs after completion of chemotherapy were also obtained today.   Solicited AEs - Patient denies any unusual shortness of breath, rash or diarrhea at present. No change in reports of urinary retention.   H&P and Performance status - See MD progress note.  Due to recent hospitalization following cycle 2 treatment, protocol therapy will be permanently discontinued for recurrent grade 3 toxicity, related to chemotherapy and immunotherapy. This was discussed with the patient by Dr. Julien Nordmann. Patient was also informed that Foye Spurling will be taking over as his research nurse. Thanked patient for his willingness to participate in this research study.  Cindy S. Brigitte Pulse BSN, RN, [place] 01/30/2020 11:25 AM

## 2020-02-02 ENCOUNTER — Other Ambulatory Visit: Payer: Self-pay | Admitting: Medical Oncology

## 2020-02-02 ENCOUNTER — Telehealth: Payer: Self-pay | Admitting: Medical Oncology

## 2020-02-02 NOTE — Research (Signed)
02/02/2020  Adverse Event Log Protocol: [facility] [id number] Cycle 2:  01/09/2020 - 01/30/2020 (End of treatment)  Solicited &/or Reportable Events Grade Attribution to study treatment Comments  Anemia Grade 1 Possible    Hypothyroidism Grade 0 -    Pneumonitis  Grade 0 -    Neutrophil count decreased Grade 4 Probable    Platelet count decreased Grade 2 Probable    Rash maculo-papular Grade 0 -    Hyperglycemia Grade 2 Possible Change in management  ALT increased Grade 0 -    AST increased Grade 0 -    Blood bilirubin increased Grade 0 -    Diarrhea Grade 3 Probable Primary SAE  Small bowel enteritis Grade 3 Possible   Dehydration Grade 3 Probable   Leukopenia Grade 4 Probable   Lymphopenia Grade 3 Probable   Malnutrition Grade 3 Possible   Hypoalbuminemia Grade 2 Possible   Urinary retention Grade 2 Baseline/Unrelated Indwelling catheter per urology  UTI Grade 2 Possible   Hypokalemia Grade 3 Probable   Hypertension Grade 2 Unlikely   Tachycardia Grade 2 Possible   Hypophosphatemia Grade 2 Possible   Hypotension Grade 2 Unlikely Medication change per cardiology  Weight loss Grade 2 Probable   Fatigue Grade 2 Probable    Non-reportable AEs (Other than Solicited, Grade = 1): - Constipation - Hypomagnesemia - Elevated creatinine  Cindy S. Brigitte Pulse BSN, RN, [place] 02/02/2020 4:45 PM

## 2020-02-02 NOTE — Telephone Encounter (Signed)
Does he need labs next week ? I Told him he does not need labs until next f/u

## 2020-02-02 NOTE — Telephone Encounter (Signed)
Unable to reach pt at listed numbers

## 2020-02-02 NOTE — Telephone Encounter (Signed)
No.  Lab and scan before next visit.  Cancel all previous lab, chemo or follow-up visit until the next visit.  Thank you.

## 2020-02-06 ENCOUNTER — Inpatient Hospital Stay: Payer: Medicare Other

## 2020-02-07 ENCOUNTER — Telehealth: Payer: Self-pay | Admitting: Internal Medicine

## 2020-02-07 NOTE — Telephone Encounter (Signed)
Scheduled appt per 8/16 sch msg - mailed reminder letter with appt date and time

## 2020-02-09 ENCOUNTER — Ambulatory Visit: Payer: Self-pay | Admitting: *Deleted

## 2020-02-13 ENCOUNTER — Inpatient Hospital Stay: Payer: Medicare Other

## 2020-02-13 DIAGNOSIS — R338 Other retention of urine: Secondary | ICD-10-CM | POA: Diagnosis not present

## 2020-02-14 ENCOUNTER — Other Ambulatory Visit: Payer: Self-pay | Admitting: *Deleted

## 2020-02-14 NOTE — Patient Outreach (Signed)
[facility]) Care Management  02/14/2020  Chad Gomez 03-09-1946 [id number]   Subjective: Telephone call to patient's home / mobile number, spoke with patient, verified his name, and call disconnected.   Telephone call to patient's home / mobile number, call picked up, no response by receiver,  and call disconnected.   Telephone call to patient's home  / mobile number, no answer, left HIPAA compliant voicemail message for Chad and Chad Gomez (patient's wife/ designated party release), and requested call back.    Objective:Per KPN (Knowledge Performance Now, point of care tool) and chart review, patient hospitalized 01/15/2020 - 01/20/2020 for Acute diarrhea: Likely secondary to chemotherapeutic agents.   Patient hospitalized 12/26/2019 - 01/02/2020 for SBO (Small Bowel Obstruction).  10/28/2019 - 11/05/2019 for lung cancer, Non-small-cell lung cancer, left upper lobe lingula, status post Bronchoscopy, Robotic-assisted wedge resection of lingula with lymph node biopsy and intercostal nerve blocks, on 11/03/2019, status post VIDEO BRONCHOSCOPY WITH ENDOBRONCHIAL NAVIGATION WITH LUNG MARKINGS, XI ROBOTIC ASSISTED THORASCOPY, -Wedge Resection Lingula Left Upper Lobe, on 10/28/2019.  Patient has a history of BPH (benign prostatic hypertrophy), Chronic kidney disease, COPD (chronic obstructive pulmonary disease), diabetes, bladder stone, colon polyps, kidney stones, melanoma excision (left ear),  Hyperlipidemia, Hypertension, Peripheral vascular disease, Right ureteral stone, Shingles, chronic indwelling foley, Tobacco abuse, Nephrolithiasis, Centrilobular emphysema, Aortoiliac occlusive disease , and status post aortobifemoral bypass surgery on 05/26/2016. Patient's A1C was 6.2 on 06/07/2019.      Assessment: Received [employer] EMMI General Discharge Red Flag Alert follow up referral on 11/08/2019. EMMI Red Alert Trigger, Day #1, patient stated no to the following  question: Got discharge papers?EMMI follow up completed and will follow up to assess further care management needs.       Plan:RNCM will call patient's wife / designated party releaseand/ or patient, for 2nd telephone outreach attempt, within7business days, to assess for further CM needs, and proceed with case closure, after 4th unsuccessful call.     Chad Gomez H. Annia Friendly, BSN, [place] Management [facility] Telephonic CM Phone: [phone] Fax: [phone]

## 2020-02-20 ENCOUNTER — Other Ambulatory Visit: Payer: Medicare Other

## 2020-02-20 ENCOUNTER — Ambulatory Visit: Payer: Medicare Other

## 2020-02-20 ENCOUNTER — Ambulatory Visit: Payer: Medicare Other | Admitting: Physician Assistant

## 2020-02-21 ENCOUNTER — Encounter: Payer: Self-pay | Admitting: *Deleted

## 2020-02-21 ENCOUNTER — Other Ambulatory Visit: Payer: Self-pay | Admitting: *Deleted

## 2020-02-21 NOTE — Patient Outreach (Signed)
[facility]) Care Management  [place]  02/21/2020   Chad Gomez 21-Jun-1946 [id number]  Subjective: Telephone call to patient's home  / mobile number, no answer, left HIPAA compliant voicemail message for Chad and Chad Gomez (patient's wife/ designated party release), and requested call back. Telephone call from patient's home / mobile number, spoke with patient's wife / designated party release Chad Gomez), stated patient's name, date of birth, and address.  Wife states patient is doing pretty, is eating better, gaining weight, and working on increasing exercise.  Wife states patient  is able to manage self care and has assistance as needed.  Wife states she is aware of signs/ symptoms to report, how to reach provider if needed after hours, when to go to ED, and / or call 911.  Wife states patient takes blood pressure daily, does not record, and will work on recording it in the future.  States patient has the following appointments: on 03/08/2020 with Eye MD, on 03/12/2020 with urologist, and on 03/14/2020 with primary MD.  Chad Gomez with patient briefly, discussed patient specific assessment questions (regarding neglect and depression).  Wife states patient  does not have any Air traffic controller, transportation, Data processing manager, or pharmacy needs at this time. States she is very appreciative of the follow up and is in agreement to continue to receive [place] Management information / services on patient's behalf.       Objective:  Per KPN (Knowledge Performance Now, point of care tool) and chart review, patient hospitalized 01/15/2020 - 01/20/2020 for Acute diarrhea: Likely secondary to chemotherapeutic agents. Patient hospitalized 12/26/2019 - 01/02/2020 for SBO (Small Bowel Obstruction). 10/28/2019 - 11/05/2019 for lung cancer, Non-small-cell lung cancer, left upper lobe lingula, status post Bronchoscopy, Robotic-assisted wedge resection of lingula with lymph node biopsy  and intercostal nerve blocks, on 11/03/2019, status post VIDEO BRONCHOSCOPY WITH ENDOBRONCHIAL NAVIGATION WITH LUNG MARKINGS, XI ROBOTIC ASSISTED THORASCOPY, -Wedge Resection Lingula Left Upper Lobe, on 10/28/2019. Patient has a history of BPH (benign prostatic hypertrophy), Chronic kidney disease, COPD (chronic obstructive pulmonary disease), diabetes, bladder stone, colon polyps, kidney stones, melanoma excision (left ear), Hyperlipidemia, Hypertension, Peripheral vascular disease, Right ureteral stone, Shingles, chronic indwelling foley, Tobacco abuse, Nephrolithiasis, Centrilobular emphysema, Aortoiliac occlusive disease , and status post aortobifemoral bypass surgery on 05/26/2016. Patient's A1C was 6.2 on 06/07/2019.    Encounter Medications:  Outpatient Encounter Medications as of 02/21/2020  Medication Sig Note  . acetaminophen (TYLENOL) 500 MG tablet Take 2 tablets (1,000 mg total) by mouth every 6 (six) hours as needed for mild pain.   Marland Kitchen aspirin EC 81 MG tablet Take 81 mg by mouth daily.   Marland Kitchen atorvastatin (LIPITOR) 40 MG tablet Take 40 mg by mouth daily.   . cetirizine (ZYRTEC) 10 MG tablet Take 10 mg by mouth daily.   . Cholecalciferol (VITAMIN D-3) 1000 units CAPS Take 1,000 Units by mouth daily.    Marland Kitchen ezetimibe (ZETIA) 10 MG tablet Take 5 mg by mouth daily. 02/21/2020: Wife states taking it as needed.   . fluticasone (FLONASE) 50 MCG/ACT nasal spray Place 2 sprays into both nostrils daily.    . folic acid (FOLVITE) 1 MG tablet Take 1 mg by mouth daily.   . K Phos Mono-Sod Phos Di & Mono (K-PHOS-NEUTRAL) [phone] MG TABS Take 2 tablets by mouth in the morning and at bedtime.   . magnesium oxide (MAG-OX) 400 (241.3 Mg) MG tablet Take 1 tablet (400 mg total) by mouth 2 (two) times daily.   Marland Kitchen  metFORMIN (GLUCOPHAGE-XR) 500 MG 24 hr tablet Take 500-1,000 mg by mouth See admin instructions. Take 500 mg every morning and take 1000 mg at supper   . potassium chloride SA (KLOR-CON) 20 MEQ tablet  Take 1 tablet (20 mEq total) by mouth 2 (two) times daily.   . potassium citrate (UROCIT-K) 10 MEQ (1080 MG) SR tablet Take 10 mEq by mouth in the morning and at bedtime.    . tamsulosin (FLOMAX) 0.4 MG CAPS capsule Take 0.4 mg by mouth daily after supper.    . Tiotropium Bromide-Olodaterol (STIOLTO RESPIMAT) 2.5-2.5 MCG/ACT AERS Inhale 2 puffs into the lungs daily.   . traMADol (ULTRAM) 50 MG tablet Take 1 tablet (50 mg total) by mouth every 6 (six) hours as needed (mild pain).   Marland Kitchen albuterol (VENTOLIN HFA) 108 (90 Base) MCG/ACT inhaler Inhale 2 puffs into the lungs every 6 (six) hours as needed for wheezing or shortness of breath. (Patient not taking: Reported on 01/30/2020)   . finasteride (PROSCAR) 5 MG tablet Take 5 mg by mouth daily. (Patient not taking: Reported on 02/21/2020) 02/21/2020: Wife states has been discontinued per MD.   . nicotine (NICODERM CQ - DOSED IN MG/24 HOURS) 21 mg/24hr patch Place 1 patch (21 mg total) onto the skin daily. (Patient not taking: Reported on 01/30/2020)   . Nutritional Supplements (FEEDING SUPPLEMENT, KATE FARMS STANDARD 1.4,) LIQD liquid Take 325 mLs by mouth 2 (two) times daily between meals. (Patient not taking: Reported on 01/30/2020)   . prochlorperazine (COMPAZINE) 10 MG tablet Take 1 tablet (10 mg total) by mouth every 6 (six) hours as needed. (Patient not taking: Reported on 01/30/2020)    No facility-administered encounter medications on file as of 02/21/2020.    Functional Status:  In your present state of health, do you have any difficulty performing the following activities: 01/16/2020 12/27/2019  Hearing? N N  Vision? N N  Difficulty concentrating or making decisions? N N  Walking or climbing stairs? Y N  Comment secondary to weakness -  Dressing or bathing? Y N  Doing errands, shopping? N Y  Comment - "wants wife to be with me when running errands"  Some recent data might be hidden    Fall/Depression Screening: No flowsheet data found. PHQ 2/9  Scores 02/21/2020 11/16/2019  PHQ - 2 Score 0 -  Exception Documentation - Other- indicate reason in comment box  Not completed - Spoke with wife per patient and (patient's wife / designated party release) request.    Assessment: Received [employer] EMMI General Discharge Red Flag Alert follow up referral on 11/08/2019. EMMI Red Alert Trigger, Day #1, patient stated no to the following question: Got discharge papers?EMMI follow up completed and will follow up for other diagnosis care management needs.    Goals    .  Patient Stated wants to improve exercise . (pt-stated)      CARE PLAN ENTRY (see longitudinal plan of care for additional care plan information)  Current Barriers:  Marland Kitchen Knowledge Deficits related to lung cancer  Nurse Case Manager Clinical Goal(s):  Marland Kitchen Over the next 60 days, patient will not experience hospital admission. Hospital Admissions in last 6 months = 3 . Over the next 30 days, patient will attend all scheduled medical appointments: Eye MD, urologist, and primary MD  Interventions:  . Inter-disciplinary care team collaboration (see longitudinal plan of care) . Reviewed medications with patient and discussed changes . Discussed plans with patient for ongoing care management follow up and  provided patient with direct contact information for care management team  Patient Self Care Activities:  . Attends all scheduled provider appointments . Performs ADL's independently . Patient working on increasing exercise  Initial goal documentation         Plan: RNCM will send patient welcome outreach letter, welcome packet, consent, [facility] pamphlet, [place], and magnet. RNCM will send primary MD barrier/ involvement letter and route assessment.  RNCM will call patient's wife / designated party releaseand/ or patient,for  telephone outreach attempt, within30 business days, to assess for further CM needs, and proceed with case closure, after 4th  unsuccessful call.       Arvle Grabe H. Annia Friendly, BSN, [place] Management [facility] Telephonic CM Phone: [phone] Fax: [phone]

## 2020-03-02 DIAGNOSIS — I10 Essential (primary) hypertension: Secondary | ICD-10-CM | POA: Diagnosis not present

## 2020-03-08 ENCOUNTER — Ambulatory Visit: Payer: Medicare Other | Admitting: Cardiothoracic Surgery

## 2020-03-08 ENCOUNTER — Other Ambulatory Visit: Payer: Self-pay

## 2020-03-08 VITALS — BP 125/79 | HR 100 | Temp 97.6°F | Resp 20 | Ht 70.0 in | Wt 160.0 lb

## 2020-03-08 DIAGNOSIS — C3410 Malignant neoplasm of upper lobe, unspecified bronchus or lung: Secondary | ICD-10-CM

## 2020-03-08 DIAGNOSIS — Z85118 Personal history of other malignant neoplasm of bronchus and lung: Secondary | ICD-10-CM | POA: Diagnosis not present

## 2020-03-08 NOTE — Progress Notes (Signed)
[address]       [place] [address]             [phone]      Makel J Drewes [place] Medical Record #[id number] Date of Birth: [birth year]  Referring: Collene Gobble, MD Primary Care: [name], MD Primary Cardiologist: Minus Breeding, MD   Chief Complaint:   POST OP FOLLOW UP OPERATIVE REPORT DATE OF PROCEDURE:  10/28/2019 PREOPERATIVE DIAGNOSIS:  Non-small-cell lung cancer, left upper lobe lingula. POSTOPERATIVE DIAGNOSIS:  Non-small-cell lung cancer, left upper lobe lingula. SURGICAL PROCEDURES:   1.  Bronchoscopy with navigation bronchoscopy and marking of the left lung lesion. 2.  Robotic-assisted wedge resection of lingula with lymph node biopsy and intercostal nerve blocks.  Cancer Staging Lung cancer, lingula ([place]) Staging form: Lung, AJCC 8th Edition - Pathologic stage from 11/01/2019: Stage IIIA (pT2a, pN2, cM0) - Signed by [name], MD on 11/01/2019  History of Present Illness:     Patient returns today for follow-up visit after recent discharge from the hospital.  He underwent robotic assisted wedge resection of the lingula with lymph node biopsy and intercostal nerve block, postop he had a prolonged air leak and was discharged home with chest tube in place.  The chest tube was removed in office 4 weeks ago.  Patient comes in today with a follow-up chest x-ray.  He he denies any fever chills.  \ Biomarkers/PDL1:  Detected EGFR mutation in exon 21 ? Positive for the EGFR P848L mutation in exon 21. No mutation was detected in exons 12, 18, 19, and 20. The clinical significance of P848L in exon 21 is uncertain. This variant has been reported to behave like functionally silent polymorphism. [name], M.M., et al. Mol Cancer 6, 56 (2007). ? PD-L1 Expression Level: TPS < 1% PD-L1 Expression Status: No Expression NEGATIVE FOR ALK REARRANGEMENT     CURRENT THERAPY: Adjuvant systemic chemotherapy on the [facility] A [id number]  protocol with cisplatin 70 mg/M2, pemetrexed 500 mg/M2 every 3 weeks in addition to +/- pembrolizumab 400 mg IV every 6 weeks.  Patient ended up being admitted with each cycle of chemo through the emergency room, ultimately he completed 2 cycles and then declined any further chemotherapy.  He notes that he is slowly gaining strength.  Uses a cane for stability but says he does not really need it.    Past Medical History:  Diagnosis Date  . BPH (benign prostatic hypertrophy)    weak stream  . Chronic kidney disease   . Complication of anesthesia    urinary retention  . COPD (chronic obstructive pulmonary disease) ([place])   . Diabetes mellitus without complication ([place])    Type II  . History of bladder stone   . History of colon polyps    08/2013   pre-canerous  . History of kidney stones   . History of melanoma excision    LEFT EAR  . Hyperlipidemia   . Hypertension   . Iron deficiency   . Lung cancer, lingula ([place]) 11/01/2019  . Open wound    left lower jaw; followed by ENT  . Peripheral vascular disease ([place])   . Right ureteral stone   . Shingles 05-15-2015     Social History   Tobacco Use  Smoking Status Current Every Day Smoker  . Packs/day: 1.00  . Years: 59.00  . Pack years: 59.00  . Types: Cigarettes  . Start date: 06/24/1959  Smokeless Tobacco Never Used  Social History   Substance and Sexual Activity  Alcohol Use No  . Alcohol/week: 0.0 standard drinks     Allergies  Allergen Reactions  . Morphine And Related Anxiety and Other (See Comments)    Combative, incoherent    . Tape Other (See Comments) and Rash    Surgical tape Adhesive tape causes blisters  . Amoxicillin Other (See Comments)    Severe stomach pain  . Naproxen Swelling    Eyes swell    Current Outpatient Medications  Medication Sig Dispense Refill  . acetaminophen (TYLENOL) 500 MG tablet Take 2 tablets (1,000 mg total) by mouth every 6 (six) hours as needed for mild pain. 30 tablet 0   . albuterol (VENTOLIN HFA) 108 (90 Base) MCG/ACT inhaler Inhale 2 puffs into the lungs every 6 (six) hours as needed for wheezing or shortness of breath. 8 g 3  . aspirin EC 81 MG tablet Take 81 mg by mouth daily.    Marland Kitchen atorvastatin (LIPITOR) 40 MG tablet Take 40 mg by mouth daily.    . Cholecalciferol (VITAMIN D-3) 1000 units CAPS Take 1,000 Units by mouth daily.     Marland Kitchen ezetimibe (ZETIA) 10 MG tablet Take 5 mg by mouth daily.    . finasteride (PROSCAR) 5 MG tablet Take 5 mg by mouth daily.     . fluticasone (FLONASE) 50 MCG/ACT nasal spray Place 2 sprays into both nostrils daily.     . folic acid (FOLVITE) 1 MG tablet Take 1 mg by mouth daily.    Marland Kitchen gabapentin (NEURONTIN) 300 MG capsule Take 300 mg by mouth daily.    . magnesium oxide (MAG-OX) 400 (241.3 Mg) MG tablet Take 1 tablet (400 mg total) by mouth 2 (two) times daily. 60 tablet 0  . metFORMIN (GLUCOPHAGE-XR) 500 MG 24 hr tablet Take 500-1,000 mg by mouth See admin instructions. Take 500 mg every morning and take 1000 mg at supper    . Nutritional Supplements (FEEDING SUPPLEMENT, KATE FARMS STANDARD 1.4,) LIQD liquid Take 325 mLs by mouth 2 (two) times daily between meals. 325 mL 0  . potassium citrate (UROCIT-K) 10 MEQ (1080 MG) SR tablet Take 10 mEq by mouth in the morning and at bedtime.     . tamsulosin (FLOMAX) 0.4 MG CAPS capsule Take 0.4 mg by mouth daily after supper.     . Tiotropium Bromide-Olodaterol (STIOLTO RESPIMAT) 2.5-2.5 MCG/ACT AERS Inhale 2 puffs into the lungs daily. 1 Inhaler 11  . traMADol (ULTRAM) 50 MG tablet Take 1 tablet (50 mg total) by mouth every 6 (six) hours as needed (mild pain). 20 tablet 0  . K Phos Mono-Sod Phos Di & Mono (K-PHOS-NEUTRAL) [phone] MG TABS Take 2 tablets by mouth in the morning and at bedtime. (Patient not taking: Reported on 03/08/2020) 120 tablet 0  . nicotine (NICODERM CQ - DOSED IN MG/24 HOURS) 21 mg/24hr patch Place 1 patch (21 mg total) onto the skin daily. (Patient not taking:  Reported on 01/30/2020) 28 patch 0   No current facility-administered medications for this visit.       Physical Exam: BP 125/79   Pulse 100   Temp 97.6 F (36.4 C) (Skin)   Resp 20   Ht 5' 10"  (1.778 m)   Wt 160 lb (72.6 kg)   SpO2 96% Comment: RA  BMI 22.96 kg/m   General appearance: alert, cooperative and no distress Head: Normocephalic, without obvious abnormality, atraumatic Lymph nodes: Cervical, supraclavicular, and axillary nodes normal. Resp: clear to auscultation  bilaterally Cardio: regular rate and rhythm, S1, S2 normal, no murmur, click, rub or gallop GI: soft, non-tender; bowel sounds normal; no masses,  no organomegaly Extremities: extremities normal, atraumatic, no cyanosis or edema and Homans sign is negative, no sign of DVT Neurologic: Grossly normal  Diagnostic Studies & Laboratory data:     Recent Radiology Findings:  Follow up ct chest has been arranged by oncology   Recent Lab Findings: Lab Results  Component Value Date   WBC 10.6 (H) 01/30/2020   HGB 11.6 (L) 01/30/2020   HCT 34.2 (L) 01/30/2020   PLT 309 01/30/2020   GLUCOSE 112 (H) 01/30/2020   TRIG 137 01/02/2020   ALT 24 01/30/2020   AST 24 01/30/2020   NA 138 01/30/2020   K 4.0 01/30/2020   CL 99 01/30/2020   CREATININE 0.77 01/30/2020   BUN 7 (L) 01/30/2020   CO2 30 01/30/2020   TSH 2.649 01/30/2020   INR 1.1 12/26/2019   HGBA1C 6.1 (H) 12/26/2019      Assessment / Plan:   Follow-up CT scans per medical oncology See me back in December 2021   Medication Changes: No orders of the defined types were placed in this encounter.     [name] MD      [address] [place] [address] Office [phone]     03/08/2020 12:12 PM

## 2020-03-12 DIAGNOSIS — R338 Other retention of urine: Secondary | ICD-10-CM | POA: Diagnosis not present

## 2020-03-14 DIAGNOSIS — E1140 Type 2 diabetes mellitus with diabetic neuropathy, unspecified: Secondary | ICD-10-CM | POA: Diagnosis not present

## 2020-03-14 DIAGNOSIS — I10 Essential (primary) hypertension: Secondary | ICD-10-CM | POA: Diagnosis not present

## 2020-03-16 ENCOUNTER — Telehealth: Payer: Self-pay | Admitting: *Deleted

## 2020-03-16 MED ORDER — MAGNESIUM OXIDE 400 (241.3 MG) MG PO TABS: 400.0000 mg | ORAL_TABLET | Freq: Two times a day (BID) | ORAL | 0 refills | Status: DC

## 2020-03-16 NOTE — Telephone Encounter (Signed)
We will need to check his magnesium level first before stopping the treatment.  Thank you.

## 2020-03-16 NOTE — Telephone Encounter (Signed)
Notified of message below. Refill sent to pharmacy

## 2020-03-16 NOTE — Telephone Encounter (Signed)
Mr Vacca wants to know if you want him to stop Magnesium Oxide when this prescription runs out or do you want to refill. Has 8 tablets left.

## 2020-03-19 ENCOUNTER — Other Ambulatory Visit: Payer: Self-pay | Admitting: *Deleted

## 2020-03-19 DIAGNOSIS — I10 Essential (primary) hypertension: Secondary | ICD-10-CM | POA: Diagnosis not present

## 2020-03-19 DIAGNOSIS — E11319 Type 2 diabetes mellitus with unspecified diabetic retinopathy without macular edema: Secondary | ICD-10-CM | POA: Diagnosis not present

## 2020-03-19 NOTE — Patient Outreach (Signed)
[facility]) Care Management  [place]  03/19/2020   Chad Gomez [birth year] [id number]  Subjective:  Telephone call to patient's home / mobile number, no answer, left HIPAA compliant message for patient and/ or  patient's wife / designated party release [name]), and requested call back.  Telephone call from patient's home / mobile number, spoke with patient and patient's wife / designated party release [name]), states they are in the car on the way to physical therapy.   Wife stated patient's name, date of birth, and address.  Wife states patient is doing fantastic, he is currently driving her to physical therapy, she received the [place] Consent, and states it is an excellent resource.  Wife states patient has attended all of the following appointments and appointments went well: Eye MD on 03/08/2020, urologist on 03/12/2020, primary MD on 03/14/2020.   Spoke with patient briefly, had a question regarding voicemail, asked if when [facility] called did it go straight to voicemail, advised yes it went straight to voicemail, states he had a partial block on the phone, and he will have corrected.   Patient and wife states they does not have any Air traffic controller, transportation, [employer], or pharmacy needs at this time. RNCM advised will be transitioning into a different role after 04/05/2020 and another RNCM will be calling them in the future.   Wife states she will miss this RNCM and is in agreement to continue to receive [place] [employer] services on patient's behalf.   States she has this RNCM's and [place] [employer] contact information, will call if assistance needed prior to next patent outreach.      Objective: Per KPN (Knowledge Performance Now, point of care tool) and chart review, patient hospitalized 01/15/2020 - 01/20/2020 for Acute diarrhea: Likely secondary to chemotherapeutic agents. Patient hospitalized 12/26/2019 - 01/02/2020 for SBO  (Small Bowel Obstruction). 10/28/2019 - 11/05/2019 for lung cancer, Non-small-cell lung cancer, left upper lobe lingula, status post Bronchoscopy, Robotic-assisted wedge resection of lingula with lymph node biopsy and intercostal nerve blocks, on 11/03/2019, status post VIDEO BRONCHOSCOPY WITH ENDOBRONCHIAL NAVIGATION WITH LUNG MARKINGS, XI ROBOTIC ASSISTED THORASCOPY, -Wedge Resection Lingula Left Upper Lobe, on 10/28/2019. Patient has a history of BPH (benign prostatic hypertrophy), Chronic kidney disease, COPD (chronic obstructive pulmonary disease), diabetes, bladder stone, colon polyps, kidney stones, melanoma excision (left ear), Hyperlipidemia, Hypertension, Peripheral vascular disease, Right ureteral stone, Shingles, chronic indwelling foley, Tobacco abuse, Nephrolithiasis, Centrilobular emphysema, Aortoiliac occlusive disease , and status post aortobifemoral bypass surgery on 05/26/2016. Patient's A1C was 6.2 on 06/07/2019.    Encounter Medications:  Outpatient Encounter Medications as of 03/19/2020  Medication Sig Note  . acetaminophen (TYLENOL) 500 MG tablet Take 2 tablets (1,000 mg total) by mouth every 6 (six) hours as needed for mild pain.   Marland Kitchen albuterol (VENTOLIN HFA) 108 (90 Base) MCG/ACT inhaler Inhale 2 puffs into the lungs every 6 (six) hours as needed for wheezing or shortness of breath.   Marland Kitchen aspirin EC 81 MG tablet Take 81 mg by mouth daily.   Marland Kitchen atorvastatin (LIPITOR) 40 MG tablet Take 40 mg by mouth daily.   . Cholecalciferol (VITAMIN D-3) 1000 units CAPS Take 1,000 Units by mouth daily.    Marland Kitchen ezetimibe (ZETIA) 10 MG tablet Take 5 mg by mouth daily. 02/21/2020: Wife states taking it as needed.   . finasteride (PROSCAR) 5 MG tablet Take 5 mg by mouth daily.  02/21/2020: Wife states has been discontinued per MD.   .  fluticasone (FLONASE) 50 MCG/ACT nasal spray Place 2 sprays into both nostrils daily.    . folic acid (FOLVITE) 1 MG tablet Take 1 mg by mouth daily.   Marland Kitchen gabapentin  (NEURONTIN) 300 MG capsule Take 300 mg by mouth daily.   . K Phos Mono-Sod Phos Di & Mono (K-PHOS-NEUTRAL) [phone] MG TABS Take 2 tablets by mouth in the morning and at bedtime. (Patient not taking: Reported on 03/08/2020)   . magnesium oxide (MAG-OX) 400 (241.3 Mg) MG tablet Take 1 tablet (400 mg total) by mouth 2 (two) times daily.   . metFORMIN (GLUCOPHAGE-XR) 500 MG 24 hr tablet Take 500-1,000 mg by mouth See admin instructions. Take 500 mg every morning and take 1000 mg at supper   . nicotine (NICODERM CQ - DOSED IN MG/24 HOURS) 21 mg/24hr patch Place 1 patch (21 mg total) onto the skin daily. (Patient not taking: Reported on 01/30/2020)   . Nutritional Supplements (FEEDING SUPPLEMENT, KATE FARMS STANDARD 1.4,) LIQD liquid Take 325 mLs by mouth 2 (two) times daily between meals.   . potassium citrate (UROCIT-K) 10 MEQ (1080 MG) SR tablet Take 10 mEq by mouth in the morning and at bedtime.    . tamsulosin (FLOMAX) 0.4 MG CAPS capsule Take 0.4 mg by mouth daily after supper.    . Tiotropium Bromide-Olodaterol (STIOLTO RESPIMAT) 2.5-2.5 MCG/ACT AERS Inhale 2 puffs into the lungs daily.   . traMADol (ULTRAM) 50 MG tablet Take 1 tablet (50 mg total) by mouth every 6 (six) hours as needed (mild pain).    No facility-administered encounter medications on file as of 03/19/2020.    Functional Status:  In your present state of health, do you have any difficulty performing the following activities: 01/16/2020 12/27/2019  Hearing? N N  Vision? N N  Difficulty concentrating or making decisions? N N  Walking or climbing stairs? Y N  Comment secondary to weakness -  Dressing or bathing? Y N  Doing errands, shopping? N Y  Comment - "wants wife to be with me when running errands"  Some recent data might be hidden    Fall/Depression Screening: No flowsheet data found. PHQ 2/9 Scores 02/21/2020 11/16/2019  PHQ - 2 Score 0 -  Exception Documentation - Other- indicate reason in comment box  Not completed  - Spoke with wife per patient and (patient's wife / designated party release) request.    Assessment: Received [employer] Medicare EMMI General Discharge Red Flag Alert follow up referral on 11/08/2019. EMMI Red Alert Trigger, Day #1, patient stated no to the following question: Got discharge papers?EMMI follow up completed and will follow up for other diagnosis care management needs.   Goals    .  Patient Stated wants to improve exercise . (pt-stated)      CARE PLAN ENTRY (see longitudinal plan of care for additional care plan information)  Current Barriers:  Marland Kitchen Knowledge Deficits related to lung cancer  Nurse Case Manager Clinical Goal(s):  Marland Kitchen Over the next 60 days, patient will not experience hospital admission. Hospital Admissions in last 6 months = 3 . Over the next 30 days, patient will attend all scheduled medical appointments: Eye MD, urologist, and primary MD  Interventions:  . Inter-disciplinary care team collaboration (see longitudinal plan of care) . Reviewed medications with patient and discussed changes . Discussed plans with patient for ongoing care management follow up and provided patient with direct contact information for care management team . Wife states they have received the [place] and  it is an excellent resource.   Patient Self Care Activities:  . Attends all scheduled provider appointments . Performs ADL's independently . Patient has resumed driving to appointments . Patient working on increasing exercise  Updated on 03/19/2020          Plan: [facility] will call patient's wife / designated party releaseand/ or patient,fortelephone outreach attempt, within30 business days, to assess for further CM needs, and proceed with case closure, after 4th unsuccessful call.      Quindarrius [place]oplin H. Annia Friendly, BSN, [place] Telephonic CM Phone: [phone] Fax: [phone] `

## 2020-03-21 DIAGNOSIS — E11319 Type 2 diabetes mellitus with unspecified diabetic retinopathy without macular edema: Secondary | ICD-10-CM | POA: Diagnosis not present

## 2020-03-23 DIAGNOSIS — I10 Essential (primary) hypertension: Secondary | ICD-10-CM | POA: Diagnosis not present

## 2020-03-23 DIAGNOSIS — E1140 Type 2 diabetes mellitus with diabetic neuropathy, unspecified: Secondary | ICD-10-CM | POA: Diagnosis not present

## 2020-03-23 DIAGNOSIS — E11319 Type 2 diabetes mellitus with unspecified diabetic retinopathy without macular edema: Secondary | ICD-10-CM | POA: Diagnosis not present

## 2020-04-05 DIAGNOSIS — R338 Other retention of urine: Secondary | ICD-10-CM | POA: Diagnosis not present

## 2020-04-06 DIAGNOSIS — H25013 Cortical age-related cataract, bilateral: Secondary | ICD-10-CM | POA: Diagnosis not present

## 2020-04-06 DIAGNOSIS — H524 Presbyopia: Secondary | ICD-10-CM | POA: Diagnosis not present

## 2020-04-06 DIAGNOSIS — H2513 Age-related nuclear cataract, bilateral: Secondary | ICD-10-CM | POA: Diagnosis not present

## 2020-04-06 DIAGNOSIS — E113293 Type 2 diabetes mellitus with mild nonproliferative diabetic retinopathy without macular edema, bilateral: Secondary | ICD-10-CM | POA: Diagnosis not present

## 2020-04-11 DIAGNOSIS — R338 Other retention of urine: Secondary | ICD-10-CM | POA: Diagnosis not present

## 2020-04-12 DIAGNOSIS — R338 Other retention of urine: Secondary | ICD-10-CM | POA: Diagnosis not present

## 2020-04-16 ENCOUNTER — Ambulatory Visit: Payer: Self-pay | Admitting: *Deleted

## 2020-04-20 DIAGNOSIS — I10 Essential (primary) hypertension: Secondary | ICD-10-CM | POA: Diagnosis not present

## 2020-04-20 DIAGNOSIS — E1140 Type 2 diabetes mellitus with diabetic neuropathy, unspecified: Secondary | ICD-10-CM | POA: Diagnosis not present

## 2020-04-23 DIAGNOSIS — R338 Other retention of urine: Secondary | ICD-10-CM | POA: Diagnosis not present

## 2020-04-24 ENCOUNTER — Emergency Department (HOSPITAL_COMMUNITY)
Admission: EM | Admit: 2020-04-24 | Discharge: 2020-04-24 | Disposition: A | Discharge status: Home / Self Care | Payer: Medicare Other | Attending: Emergency Medicine | Admitting: Emergency Medicine

## 2020-04-24 ENCOUNTER — Other Ambulatory Visit: Payer: Self-pay

## 2020-04-24 ENCOUNTER — Encounter (HOSPITAL_COMMUNITY): Payer: Self-pay | Admitting: Emergency Medicine

## 2020-04-24 DIAGNOSIS — E1122 Type 2 diabetes mellitus with diabetic chronic kidney disease: Secondary | ICD-10-CM | POA: Insufficient documentation

## 2020-04-24 DIAGNOSIS — Z960 Presence of urogenital implants: Secondary | ICD-10-CM | POA: Insufficient documentation

## 2020-04-24 DIAGNOSIS — N189 Chronic kidney disease, unspecified: Secondary | ICD-10-CM | POA: Diagnosis not present

## 2020-04-24 DIAGNOSIS — Z7982 Long term (current) use of aspirin: Secondary | ICD-10-CM | POA: Insufficient documentation

## 2020-04-24 DIAGNOSIS — J449 Chronic obstructive pulmonary disease, unspecified: Secondary | ICD-10-CM | POA: Insufficient documentation

## 2020-04-24 DIAGNOSIS — F17210 Nicotine dependence, cigarettes, uncomplicated: Secondary | ICD-10-CM | POA: Diagnosis not present

## 2020-04-24 DIAGNOSIS — R339 Retention of urine, unspecified: Secondary | ICD-10-CM | POA: Diagnosis not present

## 2020-04-24 DIAGNOSIS — I129 Hypertensive chronic kidney disease with stage 1 through stage 4 chronic kidney disease, or unspecified chronic kidney disease: Secondary | ICD-10-CM | POA: Diagnosis not present

## 2020-04-24 DIAGNOSIS — Z7984 Long term (current) use of oral hypoglycemic drugs: Secondary | ICD-10-CM | POA: Insufficient documentation

## 2020-04-24 LAB — URINALYSIS, ROUTINE W REFLEX MICROSCOPIC
Bilirubin Urine: NEGATIVE
Glucose, UA: NEGATIVE mg/dL
Ketones, ur: NEGATIVE mg/dL
Nitrite: POSITIVE — AB
Protein, ur: NEGATIVE mg/dL
Specific Gravity, Urine: 1.004 — ABNORMAL LOW (ref 1.005–1.030)
pH: 7 (ref 5.0–8.0)

## 2020-04-24 MED ORDER — CEPHALEXIN 500 MG PO CAPS: 500.0000 mg | ORAL_CAPSULE | Freq: Four times a day (QID) | ORAL | 0 refills | Status: AC

## 2020-04-24 NOTE — ED Provider Notes (Signed)
[place] EMERGENCY DEPARTMENT Provider Note   CSN: [id number] Arrival date & time: 04/24/20  0325     History Chief Complaint  Patient presents with  . Urinary Retention    Chad Gomez is a 74 y.o. male.  Patient has chronic urinary retention issues, has been with Foley catheters for the last year or so.  Foley catheter was removed by urology to attempt to urinate on his own yesterday.  Backup plan was to in and out catheterize, that has failed and he is retaining urine.  He has had no fevers chills denies any significant abdominal pain, denies any hematuria denies any bowel movement changes.  Tolerating p.o. without difficulty.        Past Medical History:  Diagnosis Date  . BPH (benign prostatic hypertrophy)    weak stream  . Chronic kidney disease   . Complication of anesthesia    urinary retention  . COPD (chronic obstructive pulmonary disease) ([place])   . Diabetes mellitus without complication ([place])    Type II  . History of bladder stone   . History of colon polyps    08/2013   pre-canerous  . History of kidney stones   . History of melanoma excision    LEFT EAR  . Hyperlipidemia   . Hypertension   . Iron deficiency   . Lung cancer, lingula ([place]) 11/01/2019  . Open wound    left lower jaw; followed by ENT  . Peripheral vascular disease ([place])   . Right ureteral stone   . Shingles 05-15-2015    Patient Active Problem List   Diagnosis Date Noted  . Hypomagnesemia 01/15/2020  . Protein-calorie malnutrition, severe 12/28/2019  . Leukopenia due to antineoplastic chemotherapy ([place]) 12/26/2019  . Sepsis ([place]) 12/26/2019  . Ventral hernia 12/26/2019  . SBO (small bowel obstruction) ([place]) 12/26/2019  . Dehydration 12/26/2019  . Lactic acidosis 12/26/2019  . Chronic indwelling Foley catheter 12/26/2019  . Acute lower UTI 12/26/2019  . Encounter for antineoplastic immunotherapy 12/19/2019  . Adenocarcinoma, lung, left ([place]) 12/16/2019  .  Encounter for antineoplastic chemotherapy 11/30/2019  . Goals of care, counseling/discussion 11/09/2019  . Lung cancer, lingula ([place]) 11/01/2019  . S/P partial lobectomy of lung 10/28/2019  . Dyslipidemia 10/06/2019  . Type 2 diabetes mellitus with complication, without long-term current use of insulin ([place]) 10/06/2019  . Tobacco abuse 09/06/2019  . Hypertension 09/06/2019  . Nephrolithiasis 09/06/2019  . Melanoma ([place]) 09/06/2019  . Centrilobular emphysema ([place]) 05/23/2019  . Stage 4 very severe COPD by GOLD classification ([place]) 05/23/2019  . Atherosclerosis of native artery of both lower extremities with intermittent claudication ([place]) 05/26/2016  . S/P aortobifemoral bypass surgery 05/26/2016  . Ureteral stone with hydronephrosis 03/21/2014  . Ulnar neuropathy 02/07/2014  . Compulsive tobacco user syndrome 02/07/2014  . Allergic rhinitis 02/07/2014    Past Surgical History:  Procedure Laterality Date  . ABDOMINAL AORTAGRAM  11/01/2014   Procedure: Abdominal Aortagram;  Surgeon: Serafina Mitchell, MD;  Location: [place] [facility];  Service: Cardiovascular;;  . AORTA - BILATERAL FEMORAL ARTERY BYPASS GRAFT N/A 12/28/2014   Procedure: AORTOBIFEMORAL BYPASS GRAFT;  Surgeon: Serafina Mitchell, MD;  Location: [place];  Service: Vascular;  Laterality: N/A;  . BRONCHIAL BRUSHINGS  09/06/2019   Procedure: BRONCHIAL BRUSHINGS;  Surgeon: Collene Gobble, MD;  Location: [facility] ENDOSCOPY;  Service: Pulmonary;;  left upper lobe nodule  . BRONCHIAL WASHINGS  09/06/2019   Procedure: BRONCHIAL WASHINGS;  Surgeon: Collene Gobble,  MD;  Location: [place] ENDOSCOPY;  Service: Pulmonary;;  . COLONOSCOPY W/ POLYPECTOMY  09-13-2013  . CYSTOLITHALOPAXY OF BLADDER STONE/ TRANSRECTAL ULTRASOUND PROSTATE BX  08-19-2005  . CYSTOSCOPY W/ RETROGRADES Right 12/02/2012   Procedure: CYSTOSCOPY WITH RETROGRADE PYELOGRAM;  Surgeon: Malka So, MD;  Location: [facility];  Service: Urology;  Laterality: Right;  .  CYSTOSCOPY W/ RETROGRADES Bilateral 10/13/2013   Procedure: CYSTOSCOPY WITH RETROGRADE PYELOGRAM;  Surgeon: Irine Seal, MD;  Location: [facility];  Service: Urology;  Laterality: Bilateral;  . CYSTOSCOPY W/ URETERAL STENT PLACEMENT Left 04/11/2014   Procedure: CYSTOSCOPY WITH STENT REPLACEMENT;  Surgeon: Malka So, MD;  Location: [facility];  Service: Urology;  Laterality: Left;  . CYSTOSCOPY WITH LITHOLAPAXY Right 12/02/2012   Procedure: CYSTOSCOPY WITH stone extraction;  Surgeon: Malka So, MD;  Location: [facility];  Service: Urology;  Laterality: Right;  . CYSTOSCOPY WITH LITHOLAPAXY Right 10/13/2013   Procedure: CYSTOSCOPY WITH LITHOLAPAXY, removal of urethral stone with basket;  Surgeon: Irine Seal, MD;  Location: [facility];  Service: Urology;  Laterality: Right;  . CYSTOSCOPY WITH RETROGRADE PYELOGRAM, URETEROSCOPY AND STENT PLACEMENT Left 03/21/2014   Procedure: CYSTOSCOPY WITH RETROGRADE PYELOGRAM, AND LEFT STENT PLACEMENT;  Surgeon: Alexis Frock, MD;  Location: [facility];  Service: Urology;  Laterality: Left;  . CYSTOSCOPY WITH RETROGRADE PYELOGRAM, URETEROSCOPY AND STENT PLACEMENT  09/09/2016   Procedure: CYSTOSCOPY WITH RIGHT  RETROGRADE PYELOGRAM, URETEROSCOPY WITH LASER BASKET EXTRACTION OF STONES  AND STENT PLACEMENT;  Surgeon: Irine Seal, MD;  Location: [place];  Service: Urology;;  . Consuela Mimes WITH URETEROSCOPY Left 04/11/2014   Procedure: CYSTOSCOPY WITH URETEROSCOPY/ STONE EXTRACTION;  Surgeon: Malka So, MD;  Location: [facility];  Service: Urology;  Laterality: Left;  . FINE NEEDLE ASPIRATION  09/06/2019   Procedure: FINE NEEDLE ASPIRATION;  Surgeon: Collene Gobble, MD;  Location: [facility] ENDOSCOPY;  Service: Pulmonary;;  left upper lobe - lung  . HOLMIUM LASER APPLICATION Right 12/17/9483   Procedure: HOLMIUM LASER APPLICATION;  Surgeon: Malka So, MD;  Location: [facility];  Service: Urology;  Laterality: Right;  . HOLMIUM LASER APPLICATION Left 46/27/0350   Procedure: HOLMIUM LASER APPLICATION;  Surgeon: Malka So, MD;  Location: [facility];  Service: Urology;  Laterality: Left;  . HOLMIUM LASER APPLICATION Right 0/93/8182   Procedure: HOLMIUM LASER APPLICATION;  Surgeon: Irine Seal, MD;  Location: [facility];  Service: Urology;  Laterality: Right;  . INGUINAL HERNIA REPAIR Right 1954  . INTERCOSTAL NERVE BLOCK Left 10/28/2019   Procedure: Intercostal Nerve Block;  Surgeon: [name], MD;  Location: [place];  Service: Thoracic;  Laterality: Left;  . LITHOTRIPSY  2007 & 02/2013  . LUNG BIOPSY  09/06/2019   Procedure: LUNG BIOPSY;  Surgeon: Collene Gobble, MD;  Location: [facility] ENDOSCOPY;  Service: Pulmonary;;  left upper lob lung  . [place]  . MICROLARYNGOSCOPY Right 02/18/2016   Procedure: MICRO DIRECT LARYNGOSCOPY EXCISIONAL BIOPSY OF RIGHT VOCAL CORD MASS;  Surgeon: Leta Baptist, MD;  Location: [place];  Service: ENT;  Laterality: Right;  . [place]   left ear  . MULTIPLE TOOTH EXTRACTIONS  1994  . NEPHROLITHOTOMY Left 01/27/2013   Procedure: NEPHROLITHOTOMY PERCUTANEOUS FIRST LOOK;  Surgeon: Malka So, MD;  Location: [facility];  Service: Urology;  Laterality: Left;  . NODE DISSECTION  10/28/2019  Procedure: Node Dissection;  Surgeon: [name], MD;  Location: [place];  Service: Thoracic;;  . PERIPHERAL VASCULAR CATHETERIZATION N/A 11/01/2014   Procedure: Lower Extremity Angiography;  Surgeon: Serafina Mitchell, MD;  Location: [place] [facility];  Service: Cardiovascular;  Laterality: N/A;  . URETEROSCOPY Right 12/02/2012   Procedure: RIGHT URETEROSCOPY STONE EXTRACTION WITH  STENT PLACEMENT;  Surgeon: Malka So, MD;  Location: [facility];  Service: Urology;  Laterality: Right;  Marland Kitchen VIDEO BRONCHOSCOPY  10/28/2019   VIDEO BRONCHOSCOPY WITH  ENDOBRONCHIAL NAVIGATION WITH LUNG MARKINGS (N/A  . VIDEO BRONCHOSCOPY WITH ENDOBRONCHIAL NAVIGATION N/A 09/06/2019   Procedure: VIDEO BRONCHOSCOPY WITH ENDOBRONCHIAL NAVIGATION;  Surgeon: Collene Gobble, MD;  Location: [place] [facility]OSCOPY;  Service: Pulmonary;  Laterality: N/A;  . VIDEO BRONCHOSCOPY WITH ENDOBRONCHIAL NAVIGATION N/A 10/28/2019   Procedure: VIDEO BRONCHOSCOPY WITH ENDOBRONCHIAL NAVIGATION WITH LUNG MARKINGS;  Surgeon: [name], MD;  Location: [place];  Service: Thoracic;  Laterality: N/A;  . WEDGE RESECTION  10/28/2019   LEFT UPPER LOBE       Family History  Problem Relation Age of Onset  . Dementia Mother   . Hypertension Mother   . Stroke Mother 66  . Dementia Father   . Hypertension Sister   . Colon cancer Neg Hx     Social History   Tobacco Use  . Smoking status: Current Every Day Smoker    Packs/day: 1.00    Years: 59.00    Pack years: 59.00    Types: Cigarettes    Start date: 06/24/1959  . Smokeless tobacco: Never Used  Vaping Use  . Vaping Use: Former  Substance Use Topics  . Alcohol use: No    Alcohol/week: 0.0 standard drinks  . Drug use: No    Home Medications Prior to Admission medications   Medication Sig Start Date End Date Taking? Authorizing Provider  acetaminophen (TYLENOL) 500 MG tablet Take 2 tablets (1,000 mg total) by mouth every 6 (six) hours as needed for mild pain. 11/05/19   Barrett, Erin R, PA-C  albuterol (VENTOLIN HFA) 108 (90 Base) MCG/ACT inhaler Inhale 2 puffs into the lungs every 6 (six) hours as needed for wheezing or shortness of breath. 12/08/19   [name], MD  aspirin EC 81 MG tablet Take 81 mg by mouth daily.    [provider]  atorvastatin (LIPITOR) 40 MG tablet Take 40 mg by mouth daily. 07/15/19   [provider]  cephALEXin (KEFLEX) 500 MG capsule Take 1 capsule (500 mg total) by mouth 4 (four) times daily for 5 days. 04/24/20 04/29/20  Breck Coons, MD  Cholecalciferol (VITAMIN D-3) 1000  units CAPS Take 1,000 Units by mouth daily.     [provider]  ezetimibe (ZETIA) 10 MG tablet Take 5 mg by mouth daily.    [provider]  finasteride (PROSCAR) 5 MG tablet Take 5 mg by mouth daily.  04/25/19   [provider]  fluticasone (FLONASE) 50 MCG/ACT nasal spray Place 2 sprays into both nostrils daily.  08/09/19   [provider]  folic acid (FOLVITE) 1 MG tablet Take 1 mg by mouth daily.    [provider]  gabapentin (NEURONTIN) 300 MG capsule Take 300 mg by mouth daily.    [provider]  K Phos Mono-Sod Phos Di & Mono (K-PHOS-NEUTRAL) [phone] MG TABS Take 2 tablets by mouth in the morning and at bedtime. Patient not taking: Reported on 03/08/2020 01/02/20  Patrecia Pour, MD  magnesium oxide (MAG-OX) 400 (241.3 Mg) MG tablet Take 1 tablet (400 mg total) by mouth 2 (two) times daily. 03/16/20   Curt Bears, MD  metFORMIN (GLUCOPHAGE-XR) 500 MG 24 hr tablet Take 500-1,000 mg by mouth See admin instructions. Take 500 mg every morning and take 1000 mg at supper 04/25/19   [provider]  nicotine (NICODERM CQ - DOSED IN MG/24 HOURS) 21 mg/24hr patch Place 1 patch (21 mg total) onto the skin daily. Patient not taking: Reported on 01/30/2020 01/21/20   Swayze, Ava, DO  Nutritional Supplements (FEEDING SUPPLEMENT, KATE FARMS STANDARD 1.4,) LIQD liquid Take 325 mLs by mouth 2 (two) times daily between meals. 01/20/20   Swayze, Ava, DO  potassium citrate (UROCIT-K) 10 MEQ (1080 MG) SR tablet Take 10 mEq by mouth in the morning and at bedtime.     [provider]  tamsulosin (FLOMAX) 0.4 MG CAPS capsule Take 0.4 mg by mouth daily after supper.  08/05/19   [provider]  Tiotropium Bromide-Olodaterol (STIOLTO RESPIMAT) 2.5-2.5 MCG/ACT AERS Inhale 2 puffs into the lungs daily. 09/25/17   Juanito Doom, MD  traMADol (ULTRAM) 50 MG tablet Take 1 tablet (50 mg total) by mouth every 6 (six) hours as needed (mild  pain). 01/20/20   Swayze, Ava, DO    Allergies    Morphine and related, Tape, Amoxicillin, and Naproxen  Review of Systems   Review of Systems  Constitutional: Negative for chills and fever.  HENT: Negative for congestion and rhinorrhea.   Respiratory: Negative for cough and shortness of breath.   Cardiovascular: Negative for chest pain and palpitations.  Gastrointestinal: Negative for diarrhea, nausea and vomiting.  Genitourinary: Positive for difficulty urinating. Negative for dysuria, flank pain and hematuria.  Musculoskeletal: Negative for arthralgias and back pain.  Skin: Negative for color change and rash.  Neurological: Negative for light-headedness and headaches.    Physical Exam Updated Vital Signs BP 127/82   Pulse 86   Temp 98.9 F (37.2 C) (Oral)   Resp 16   SpO2 97%   Physical Exam Vitals and nursing note reviewed.  Constitutional:      General: He is not in acute distress.    Appearance: Normal appearance.  HENT:     Head: Normocephalic and atraumatic.     Nose: No rhinorrhea.  Eyes:     General:        Right eye: No discharge.        Left eye: No discharge.     Conjunctiva/sclera: Conjunctivae normal.  Cardiovascular:     Rate and Rhythm: Normal rate and regular rhythm.  Pulmonary:     Effort: Pulmonary effort is normal.     Breath sounds: No stridor.  Abdominal:     General: Abdomen is flat. There is no distension.     Palpations: Abdomen is soft.     Tenderness: There is no abdominal tenderness.  Musculoskeletal:        General: No deformity or signs of injury.  Skin:    General: Skin is warm and dry.  Neurological:     General: No focal deficit present.     Mental Status: He is alert. Mental status is at baseline.     Motor: No weakness.  Psychiatric:        Mood and Affect: Mood normal.        Behavior: Behavior normal.        Thought Content: Thought content normal.  ED Results / Procedures / Treatments   Labs (all labs ordered  are listed, but only abnormal results are displayed) Labs Reviewed  URINALYSIS, ROUTINE W REFLEX MICROSCOPIC - Abnormal; Notable for the following components:      Result Value   Specific Gravity, Urine 1.004 (*)    Hgb urine dipstick MODERATE (*)    Nitrite POSITIVE (*)    Leukocytes,Ua MODERATE (*)    Bacteria, UA MANY (*)    All other components within normal limits  URINE CULTURE    EKG None  Radiology No results found.  Procedures Procedures (including critical care time)  Medications Ordered in ED Medications - No data to display  ED Course  I have reviewed the triage vital signs and the nursing notes.  Pertinent labs & imaging results that were available during my care of the patient were reviewed by me and considered in my medical decision making (see chart for details).    MDM Rules/Calculators/A&P                          Patient has indwelling Foley catheter now, urinalysis does show nitrates leukocyte esterase and white blood cells however has had no infectious signs or symptoms.  Will consult urology to see if they want to have antibiotics on board or not.  He has been making urine however he retains it due to prostatic disease.  I do not feel he has renal dysfunction, I do not feel he needs any blood testing at this time.  He has routine blood work scheduled as an outpatient from his oncologist.  His electrolytes have been stable lately.  He is offered laboratory testing declines.  I spoke with the urologist on call and they recommend starting Keflex, discharge home outpatient follow-up recommended strict return precautions given Final Clinical Impression(s) / ED Diagnoses Final diagnoses:  Urinary retention    Rx / DC Orders ED Discharge Orders         Ordered    cephALEXin (KEFLEX) 500 MG capsule  4 times daily        04/24/20 0606           Breck Coons, MD 04/24/20 [phone]

## 2020-04-24 NOTE — ED Triage Notes (Signed)
Patient states that he has been retaining since 2a.  He was shown how to self cath and states that he attempted and not able to and is having some blood in his urine since attempting.  He had a catheter removed yesterday am around 9am.  He was able to urinate yesterday 5 times, now retaining.  Bladder scan showed 566mls plus.

## 2020-04-24 NOTE — ED Notes (Signed)
Pt was bladder scanned and resulted in 517

## 2020-04-25 LAB — URINE CULTURE: Culture: >=100000 — AB

## 2020-04-26 ENCOUNTER — Telehealth: Payer: Self-pay | Admitting: *Deleted

## 2020-04-26 NOTE — Telephone Encounter (Signed)
Post ED Visit - Positive Culture Follow-up  Culture report reviewed by antimicrobial stewardship pharmacist: [place] Team []  Nathan Batchelder, Pharm.D. []  [address], Pharm.D., BCPS AQ-ID []  Heide Guile, Pharm.D., BCPS []  Parks Neptune, Pharm.D., BCPS []  [place], [place].D., BCPS, AAHIVP []  [place], Pharm.D., BCPS, AAHIVP []  Legrand Como, PharmD, BCPS []  Salome Arnt, PharmD, BCPS []  Johnnette Gourd, PharmD, BCPS []  Hughes Better, PharmD []  Leeroy Cha, PharmD, BCPS []  Laqueta Linden, PharmD  [place] Team []  [address], PharmD []  Leodis Sias, PharmD []  Lindell Spar, PharmD []  Royetta Asal, Rph []  Graylin Shiver) Rema Fendt, PharmD []  Glennon Mac, PharmD []  Arlyn Dunning, PharmD []  Netta Cedars, PharmD []  Dia Sitter, PharmD []  Leone Haven, PharmD []  Gretta Arab, PharmD []  Theodis Shove, PharmD []  Peggyann Juba, PharmD   Positive urine culture Treated with Cephalexin, organism sensitive to the same and no further patient follow-up is required at this time. Reuel Boom, PhamrD  Wilson Singer Talley 04/26/2020, 10:26 AM

## 2020-04-27 ENCOUNTER — Inpatient Hospital Stay: Payer: Medicare Other | Attending: Physician Assistant

## 2020-04-27 ENCOUNTER — Ambulatory Visit (HOSPITAL_COMMUNITY)
Admission: RE | Admit: 2020-04-27 | Discharge: 2020-04-27 | Disposition: A | Discharge status: Home / Self Care | Payer: Medicare Other | Source: Ambulatory Visit | Attending: Internal Medicine | Admitting: Internal Medicine

## 2020-04-27 ENCOUNTER — Other Ambulatory Visit: Payer: Medicare Other

## 2020-04-27 ENCOUNTER — Other Ambulatory Visit: Payer: Self-pay

## 2020-04-27 DIAGNOSIS — E785 Hyperlipidemia, unspecified: Secondary | ICD-10-CM | POA: Insufficient documentation

## 2020-04-27 DIAGNOSIS — J984 Other disorders of lung: Secondary | ICD-10-CM | POA: Diagnosis not present

## 2020-04-27 DIAGNOSIS — I2510 Atherosclerotic heart disease of native coronary artery without angina pectoris: Secondary | ICD-10-CM | POA: Diagnosis not present

## 2020-04-27 DIAGNOSIS — Z006 Encounter for examination for normal comparison and control in clinical research program: Secondary | ICD-10-CM | POA: Insufficient documentation

## 2020-04-27 DIAGNOSIS — J449 Chronic obstructive pulmonary disease, unspecified: Secondary | ICD-10-CM | POA: Insufficient documentation

## 2020-04-27 DIAGNOSIS — N189 Chronic kidney disease, unspecified: Secondary | ICD-10-CM | POA: Insufficient documentation

## 2020-04-27 DIAGNOSIS — J439 Emphysema, unspecified: Secondary | ICD-10-CM | POA: Diagnosis not present

## 2020-04-27 DIAGNOSIS — C3490 Malignant neoplasm of unspecified part of unspecified bronchus or lung: Secondary | ICD-10-CM | POA: Diagnosis not present

## 2020-04-27 DIAGNOSIS — Z9221 Personal history of antineoplastic chemotherapy: Secondary | ICD-10-CM | POA: Insufficient documentation

## 2020-04-27 DIAGNOSIS — Z79899 Other long term (current) drug therapy: Secondary | ICD-10-CM | POA: Insufficient documentation

## 2020-04-27 DIAGNOSIS — C3412 Malignant neoplasm of upper lobe, left bronchus or lung: Secondary | ICD-10-CM | POA: Diagnosis present

## 2020-04-27 DIAGNOSIS — Z7984 Long term (current) use of oral hypoglycemic drugs: Secondary | ICD-10-CM | POA: Insufficient documentation

## 2020-04-27 DIAGNOSIS — Z7982 Long term (current) use of aspirin: Secondary | ICD-10-CM | POA: Insufficient documentation

## 2020-04-27 DIAGNOSIS — I10 Essential (primary) hypertension: Secondary | ICD-10-CM | POA: Insufficient documentation

## 2020-04-27 DIAGNOSIS — E119 Type 2 diabetes mellitus without complications: Secondary | ICD-10-CM | POA: Insufficient documentation

## 2020-04-27 DIAGNOSIS — N400 Enlarged prostate without lower urinary tract symptoms: Secondary | ICD-10-CM | POA: Insufficient documentation

## 2020-04-27 LAB — CMP (CANCER CENTER ONLY)
ALT: 15 U/L (ref 0–44)
AST: 16 U/L (ref 15–41)
Albumin: 3.8 g/dL (ref 3.5–5.0)
Alkaline Phosphatase: 105 U/L (ref 38–126)
Anion gap: 6 (ref 5–15)
BUN: 15 mg/dL (ref 8–23)
CO2: 31 mmol/L (ref 22–32)
Calcium: 10.2 mg/dL (ref 8.9–10.3)
Chloride: 102 mmol/L (ref 98–111)
Creatinine: 0.96 mg/dL (ref 0.61–1.24)
GFR, Estimated: >60 mL/min (ref >60)
Glucose, Bld: 108 mg/dL — ABNORMAL HIGH (ref 70–99)
Potassium: 4.1 mmol/L (ref 3.5–5.1)
Sodium: 139 mmol/L (ref 135–145)
Total Bilirubin: 0.4 mg/dL (ref 0.3–1.2)
Total Protein: 6.2 g/dL — ABNORMAL LOW (ref 6.5–8.1)

## 2020-04-27 LAB — CBC WITH DIFFERENTIAL (CANCER CENTER ONLY)
Abs Immature Granulocytes: 0.04 10*3/uL (ref 0.00–0.07)
Basophils Absolute: 0.1 10*3/uL (ref 0.0–0.1)
Basophils Relative: 1 %
Eosinophils Absolute: 0.6 10*3/uL — ABNORMAL HIGH (ref 0.0–0.5)
Eosinophils Relative: 8 %
HCT: 39.5 % (ref 39.0–52.0)
Hemoglobin: 13.1 g/dL (ref 13.0–17.0)
Immature Granulocytes: 1 %
Lymphocytes Relative: 24 %
Lymphs Abs: 1.7 10*3/uL (ref 0.7–4.0)
MCH: 31 pg (ref 26.0–34.0)
MCHC: 33.2 g/dL (ref 30.0–36.0)
MCV: 93.4 fL (ref 80.0–100.0)
Monocytes Absolute: 0.8 10*3/uL (ref 0.1–1.0)
Monocytes Relative: 11 %
Neutro Abs: 3.9 10*3/uL (ref 1.7–7.7)
Neutrophils Relative %: 55 %
Platelet Count: 181 10*3/uL (ref 150–400)
RBC: 4.23 MIL/uL (ref 4.22–5.81)
RDW: 12 % (ref 11.5–15.5)
WBC Count: 7.1 10*3/uL (ref 4.0–10.5)
nRBC: 0 % (ref 0.0–0.2)

## 2020-04-27 LAB — MAGNESIUM: Magnesium: 1.5 mg/dL — ABNORMAL LOW (ref 1.7–2.4)

## 2020-04-27 MED ORDER — IOHEXOL 300 MG/ML  SOLN
100.0000 mL | Freq: Once | INTRAMUSCULAR | Status: AC | PRN
  Administered 2020-04-27: 75 mL via INTRAVENOUS

## 2020-04-30 ENCOUNTER — Encounter: Payer: Self-pay | Admitting: Internal Medicine

## 2020-04-30 ENCOUNTER — Other Ambulatory Visit: Payer: Medicare Other

## 2020-04-30 ENCOUNTER — Inpatient Hospital Stay (HOSPITAL_BASED_OUTPATIENT_CLINIC_OR_DEPARTMENT_OTHER): Payer: Medicare Other | Admitting: Internal Medicine

## 2020-04-30 ENCOUNTER — Other Ambulatory Visit: Payer: Self-pay

## 2020-04-30 ENCOUNTER — Telehealth: Payer: Self-pay | Admitting: *Deleted

## 2020-04-30 VITALS — BP 138/88 | HR 87 | Temp 97.4°F | Resp 16 | Ht 70.0 in | Wt 175.9 lb

## 2020-04-30 DIAGNOSIS — Z006 Encounter for examination for normal comparison and control in clinical research program: Secondary | ICD-10-CM | POA: Diagnosis present

## 2020-04-30 DIAGNOSIS — Z9221 Personal history of antineoplastic chemotherapy: Secondary | ICD-10-CM | POA: Diagnosis not present

## 2020-04-30 DIAGNOSIS — C3410 Malignant neoplasm of upper lobe, unspecified bronchus or lung: Secondary | ICD-10-CM | POA: Diagnosis not present

## 2020-04-30 DIAGNOSIS — I10 Essential (primary) hypertension: Secondary | ICD-10-CM | POA: Diagnosis not present

## 2020-04-30 DIAGNOSIS — E785 Hyperlipidemia, unspecified: Secondary | ICD-10-CM | POA: Diagnosis not present

## 2020-04-30 DIAGNOSIS — Z79899 Other long term (current) drug therapy: Secondary | ICD-10-CM | POA: Diagnosis not present

## 2020-04-30 DIAGNOSIS — C3412 Malignant neoplasm of upper lobe, left bronchus or lung: Secondary | ICD-10-CM | POA: Diagnosis not present

## 2020-04-30 DIAGNOSIS — E119 Type 2 diabetes mellitus without complications: Secondary | ICD-10-CM | POA: Diagnosis not present

## 2020-04-30 DIAGNOSIS — N189 Chronic kidney disease, unspecified: Secondary | ICD-10-CM | POA: Diagnosis not present

## 2020-04-30 DIAGNOSIS — Z7982 Long term (current) use of aspirin: Secondary | ICD-10-CM | POA: Diagnosis not present

## 2020-04-30 DIAGNOSIS — C3490 Malignant neoplasm of unspecified part of unspecified bronchus or lung: Secondary | ICD-10-CM | POA: Diagnosis not present

## 2020-04-30 DIAGNOSIS — N400 Enlarged prostate without lower urinary tract symptoms: Secondary | ICD-10-CM | POA: Diagnosis not present

## 2020-04-30 DIAGNOSIS — J449 Chronic obstructive pulmonary disease, unspecified: Secondary | ICD-10-CM | POA: Diagnosis not present

## 2020-04-30 DIAGNOSIS — Z7984 Long term (current) use of oral hypoglycemic drugs: Secondary | ICD-10-CM | POA: Diagnosis not present

## 2020-04-30 MED ORDER — MAGNESIUM OXIDE 400 (241.3 MG) MG PO TABS: 400.0000 mg | ORAL_TABLET | Freq: Two times a day (BID) | ORAL | 0 refills | Status: DC

## 2020-04-30 NOTE — Telephone Encounter (Cosign Needed Addendum)
[id number] Clinic Follow Up visit;   Patient was in clinic today to see Dr. Julien Nordmann and complete study assessments for follow up visit on study.  CT Scan; Completed per protocol on 04/27/20 and reviewed by Dr. Julien Nordmann in clinic today. VS, H&P, ECOG and Adverse Events assessments; Completed today in clinic with Dr. Julien Nordmann. No ongoing grade 3 or higher adverse events identified since last visit. See clinic notes for assessments.  [facility] Physician and Patient letters, dated 04/19/20, regarding FDA approval of atezolizumab in certain cases of NSCLC;  The physician and patient letters from [facility] were given to Dr. Julien Nordmann prior to patient's appointment for his review. Dr. Julien Nordmann informed patient of the recent updates detailed in the letters during the visit.  Plan; Called patient and introduced myself. Informed patient of next study visit due in 3 months on 07/30/20 with research labs. He verbalized understanding. Thanked patient for his ongoing participation in this study.  He denied any questions for research nurse. Encouraged patient to contact research nurse if any research questions prior to next visit. He verbalized understanding.  Foye Spurling, BSN, RN Clinical Research Nurse 04/30/2020 4:48 PM

## 2020-04-30 NOTE — Progress Notes (Signed)
[place] Telephone:([phone]   Fax:(336) Anton, MD [address] [place] [address]  DIAGNOSIS: stageIIIa(T2a, N2,M0)non-small cell lung cancer, adenocarcinoma. Patient presented with a left upper lobe nodule.He was diagnosed in March 2021  Biomarkers/PDL1:  Detected EGFR mutation in exon 21 ? Positive for the EGFR P848L mutation in exon 21. No mutation was detected in exons 12, 18, 19, and 20. The clinical significance of P848L in exon 21 is uncertain. This variant has been reported to behave like functionally silent polymorphism. [name], M.M., et al. Mol Cancer 6, 56 (2007). ? PD-L1 Expression Level: TPS < 1% PD-L1 Expression Status: No Expression NEGATIVE FOR ALK REARRANGEMENT  PRIOR THERAPY: 1) Robotic assisted wedge resection of the left upper lobe under the care of Dr. Servando Snare. This was performed on 10/28/2019. 2) Adjuvant systemic chemotherapy on the [facility] A [id number] protocol with cisplatin 70 mg/M2, pemetrexed 500 mg/M2 every 3 weeks in addition to pembrolizumab 400 mg IV every 6 weeks. Start date December 19, 2019.  Status post 2 cycles.  This was discontinued secondary to intolerance.  CURRENT THERAPY:  Observation.  INTERVAL HISTORY: Chad Gomez 74 y.o. male returns to the clinic today for follow-up visit.  The patient was accompanied by his wife.  He is feeling fine today with no concerning complaints.  He exercises at regular basis.  He denied having any chest pain, shortness of breath, cough or hemoptysis.  He denied having any fever or chills.  He has no nausea, vomiting, diarrhea or constipation.  He denied having any headache or visual changes.  He had repeat CT scan of the chest performed recently and he is here for evaluation and discussion of his discuss results.   MEDICAL HISTORY: Past Medical History:  Diagnosis Date  . BPH (benign prostatic hypertrophy)     weak stream  . Chronic kidney disease   . Complication of anesthesia    urinary retention  . COPD (chronic obstructive pulmonary disease) ([place])   . Diabetes mellitus without complication ([place])    Type II  . History of bladder stone   . History of colon polyps    08/2013   pre-canerous  . History of kidney stones   . History of melanoma excision    LEFT EAR  . Hyperlipidemia   . Hypertension   . Iron deficiency   . Lung cancer, lingula ([place]) 11/01/2019  . Open wound    left lower jaw; followed by ENT  . Peripheral vascular disease ([place])   . Right ureteral stone   . Shingles 05-15-2015    ALLERGIES:  is allergic to morphine and related, tape, amoxicillin, and naproxen.  MEDICATIONS:  Current Outpatient Medications  Medication Sig Dispense Refill  . acetaminophen (TYLENOL) 500 MG tablet Take 2 tablets (1,000 mg total) by mouth every 6 (six) hours as needed for mild pain. 30 tablet 0  . albuterol (VENTOLIN HFA) 108 (90 Base) MCG/ACT inhaler Inhale 2 puffs into the lungs every 6 (six) hours as needed for wheezing or shortness of breath. 8 g 3  . aspirin EC 81 MG tablet Take 81 mg by mouth daily.    Marland Kitchen atorvastatin (LIPITOR) 40 MG tablet Take 40 mg by mouth daily.    . Cholecalciferol (VITAMIN D-3) 1000 units CAPS Take 1,000 Units by mouth daily.     Marland Kitchen ezetimibe (ZETIA) 10 MG tablet Take 5 mg by mouth daily.    Marland Kitchen  finasteride (PROSCAR) 5 MG tablet Take 5 mg by mouth daily.     . fluticasone (FLONASE) 50 MCG/ACT nasal spray Place 2 sprays into both nostrils daily.     . folic acid (FOLVITE) 1 MG tablet Take 1 mg by mouth daily.    Marland Kitchen gabapentin (NEURONTIN) 100 MG capsule Take 100 mg by mouth 3 (three) times daily.     . K Phos Mono-Sod Phos Di & Mono (K-PHOS-NEUTRAL) 724 164 8049 MG TABS Take 2 tablets by mouth in the morning and at bedtime. 120 tablet 0  . magnesium oxide (MAG-OX) 400 (241.3 Mg) MG tablet Take 1 tablet (400 mg total) by mouth 2 (two) times daily. 60 tablet 0  .  metFORMIN (GLUCOPHAGE-XR) 500 MG 24 hr tablet Take 500-1,000 mg by mouth See admin instructions. Take 500 mg every morning and take 1000 mg at supper    . nicotine (NICODERM CQ - DOSED IN MG/24 HOURS) 21 mg/24hr patch Place 1 patch (21 mg total) onto the skin daily. 28 patch 0  . Nutritional Supplements (FEEDING SUPPLEMENT, KATE FARMS STANDARD 1.4,) LIQD liquid Take 325 mLs by mouth 2 (two) times daily between meals. 325 mL 0  . potassium citrate (UROCIT-K) 10 MEQ (1080 MG) SR tablet Take 10 mEq by mouth in the morning and at bedtime.     . tamsulosin (FLOMAX) 0.4 MG CAPS capsule Take 0.4 mg by mouth daily after supper.     . Tiotropium Bromide-Olodaterol (STIOLTO RESPIMAT) 2.5-2.5 MCG/ACT AERS Inhale 2 puffs into the lungs daily. 1 Inhaler 11  . traMADol (ULTRAM) 50 MG tablet Take 1 tablet (50 mg total) by mouth every 6 (six) hours as needed (mild pain). 20 tablet 0   No current facility-administered medications for this visit.    SURGICAL HISTORY:  Past Surgical History:  Procedure Laterality Date  . ABDOMINAL AORTAGRAM  11/01/2014   Procedure: Abdominal Aortagram;  Surgeon: Serafina Mitchell, MD;  Location: [facility];  Service: Cardiovascular;;  . AORTA - BILATERAL FEMORAL ARTERY BYPASS GRAFT N/A 12/28/2014   Procedure: AORTOBIFEMORAL BYPASS GRAFT;  Surgeon: Serafina Mitchell, MD;  Location: [facility];  Service: Vascular;  Laterality: N/A;  . BRONCHIAL BRUSHINGS  09/06/2019   Procedure: BRONCHIAL BRUSHINGS;  Surgeon: Collene Gobble, MD;  Location: [facility];  Service: Pulmonary;;  left upper lobe nodule  . BRONCHIAL WASHINGS  09/06/2019   Procedure: BRONCHIAL WASHINGS;  Surgeon: Collene Gobble, MD;  Location: [facility] ENDOSCOPY;  Service: Pulmonary;;  . COLONOSCOPY W/ POLYPECTOMY  09-13-2013  . CYSTOLITHALOPAXY OF BLADDER STONE/ TRANSRECTAL ULTRASOUND PROSTATE BX  08-19-2005  . CYSTOSCOPY W/ RETROGRADES Right 12/02/2012   Procedure: CYSTOSCOPY WITH RETROGRADE PYELOGRAM;  Surgeon: Malka So,  MD;  Location: [facility];  Service: Urology;  Laterality: Right;  . CYSTOSCOPY W/ RETROGRADES Bilateral 10/13/2013   Procedure: CYSTOSCOPY WITH RETROGRADE PYELOGRAM;  Surgeon: Irine Seal, MD;  Location: [facility];  Service: Urology;  Laterality: Bilateral;  . CYSTOSCOPY W/ URETERAL STENT PLACEMENT Left 04/11/2014   Procedure: CYSTOSCOPY WITH STENT REPLACEMENT;  Surgeon: Malka So, MD;  Location: [facility];  Service: Urology;  Laterality: Left;  . CYSTOSCOPY WITH LITHOLAPAXY Right 12/02/2012   Procedure: CYSTOSCOPY WITH stone extraction;  Surgeon: Malka So, MD;  Location: [facility];  Service: Urology;  Laterality: Right;  . CYSTOSCOPY WITH LITHOLAPAXY Right 10/13/2013   Procedure: CYSTOSCOPY WITH LITHOLAPAXY, removal of urethral stone with basket;  Surgeon: Irine Seal, MD;  Location:  [facility];  Service: Urology;  Laterality: Right;  . CYSTOSCOPY WITH RETROGRADE PYELOGRAM, URETEROSCOPY AND STENT PLACEMENT Left 03/21/2014   Procedure: CYSTOSCOPY WITH RETROGRADE PYELOGRAM, AND LEFT STENT PLACEMENT;  Surgeon: Alexis Frock, MD;  Location: [facility];  Service: Urology;  Laterality: Left;  . CYSTOSCOPY WITH RETROGRADE PYELOGRAM, URETEROSCOPY AND STENT PLACEMENT  09/09/2016   Procedure: CYSTOSCOPY WITH RIGHT  RETROGRADE PYELOGRAM, URETEROSCOPY WITH LASER BASKET EXTRACTION OF STONES  AND STENT PLACEMENT;  Surgeon: Irine Seal, MD;  Location: [facility];  Service: Urology;;  . Consuela Mimes WITH URETEROSCOPY Left 04/11/2014   Procedure: CYSTOSCOPY WITH URETEROSCOPY/ STONE EXTRACTION;  Surgeon: Malka So, MD;  Location: [facility];  Service: Urology;  Laterality: Left;  . FINE NEEDLE ASPIRATION  09/06/2019   Procedure: FINE NEEDLE ASPIRATION;  Surgeon: Collene Gobble, MD;  Location: [facility] ENDOSCOPY;  Service: Pulmonary;;  left upper lobe - lung  . HOLMIUM LASER APPLICATION Right 12/22/6376    Procedure: HOLMIUM LASER APPLICATION;  Surgeon: Malka So, MD;  Location: [facility];  Service: Urology;  Laterality: Right;  . HOLMIUM LASER APPLICATION Left 58/85/0277   Procedure: HOLMIUM LASER APPLICATION;  Surgeon: Malka So, MD;  Location: [facility];  Service: Urology;  Laterality: Left;  . HOLMIUM LASER APPLICATION Right 10/02/8784   Procedure: HOLMIUM LASER APPLICATION;  Surgeon: Irine Seal, MD;  Location: [facility];  Service: Urology;  Laterality: Right;  . INGUINAL HERNIA REPAIR Right 1954  . INTERCOSTAL NERVE BLOCK Left 10/28/2019   Procedure: Intercostal Nerve Block;  Surgeon: [name], MD;  Location: [facility];  Service: Thoracic;  Laterality: Left;  . LITHOTRIPSY  2007 & 02/2013  . LUNG BIOPSY  09/06/2019   Procedure: LUNG BIOPSY;  Surgeon: Collene Gobble, MD;  Location: [facility] ENDOSCOPY;  Service: Pulmonary;;  left upper lob lung  . [place]  . MICROLARYNGOSCOPY Right 02/18/2016   Procedure: MICRO DIRECT LARYNGOSCOPY EXCISIONAL BIOPSY OF RIGHT VOCAL CORD MASS;  Surgeon: Leta Baptist, MD;  Location: [facility];  Service: ENT;  Laterality: Right;  . [place]   left ear  . MULTIPLE TOOTH EXTRACTIONS  1994  . NEPHROLITHOTOMY Left 01/27/2013   Procedure: NEPHROLITHOTOMY PERCUTANEOUS FIRST LOOK;  Surgeon: Malka So, MD;  Location: [facility];  Service: Urology;  Laterality: Left;  . NODE DISSECTION  10/28/2019   Procedure: Node Dissection;  Surgeon: [name], MD;  Location: [facility];  Service: Thoracic;;  . PERIPHERAL VASCULAR CATHETERIZATION N/A 11/01/2014   Procedure: Lower Extremity Angiography;  Surgeon: Serafina Mitchell, MD;  Location: [facility];  Service: Cardiovascular;  Laterality: N/A;  . URETEROSCOPY Right 12/02/2012   Procedure: RIGHT URETEROSCOPY STONE EXTRACTION WITH  STENT PLACEMENT;  Surgeon: Malka So, MD;  Location: [facility];  Service: Urology;   Laterality: Right;  Marland Kitchen VIDEO BRONCHOSCOPY  10/28/2019   VIDEO BRONCHOSCOPY WITH ENDOBRONCHIAL NAVIGATION WITH LUNG MARKINGS (N/A  . VIDEO BRONCHOSCOPY WITH ENDOBRONCHIAL NAVIGATION N/A 09/06/2019   Procedure: VIDEO BRONCHOSCOPY WITH ENDOBRONCHIAL NAVIGATION;  Surgeon: Collene Gobble, MD;  Location: [facility]OSCOPY;  Service: Pulmonary;  Laterality: N/A;  . VIDEO BRONCHOSCOPY WITH ENDOBRONCHIAL NAVIGATION N/A 10/28/2019   Procedure: VIDEO BRONCHOSCOPY WITH ENDOBRONCHIAL NAVIGATION WITH LUNG MARKINGS;  Surgeon: [name], MD;  Location: [facility];  Service: Thoracic;  Laterality: N/A;  . WEDGE RESECTION  10/28/2019   LEFT UPPER LOBE  REVIEW OF SYSTEMS:  A comprehensive review of systems was negative.   PHYSICAL EXAMINATION: General appearance: alert, cooperative and no distress Head: Normocephalic, without obvious abnormality, atraumatic Neck: no adenopathy, no JVD, supple, symmetrical, trachea midline and thyroid not enlarged, symmetric, no tenderness/mass/nodules Lymph nodes: Cervical, supraclavicular, and axillary nodes normal. Resp: clear to auscultation bilaterally Back: symmetric, no curvature. ROM normal. No CVA tenderness. Cardio: regular rate and rhythm, S1, S2 normal, no murmur, click, rub or gallop GI: soft, non-tender; bowel sounds normal; no masses,  no organomegaly Extremities: extremities normal, atraumatic, no cyanosis or edema  ECOG PERFORMANCE STATUS: 1 - Symptomatic but completely ambulatory  Blood pressure 138/88, pulse 87, temperature (!) 97.4 F (36.3 C), temperature source Tympanic, resp. rate 16, height 5' 10" (1.778 m), weight 175 lb 14.4 oz (79.8 kg), SpO2 98 %.  LABORATORY DATA: Lab Results  Component Value Date   WBC 7.1 04/27/2020   HGB 13.1 04/27/2020   HCT 39.5 04/27/2020   MCV 93.4 04/27/2020   PLT 181 04/27/2020      Chemistry      Component Value Date/Time   NA 139 04/27/2020 1358   K 4.1 04/27/2020 1358   CL 102 04/27/2020 1358   CO2 31  04/27/2020 1358   BUN 15 04/27/2020 1358   CREATININE 0.96 04/27/2020 1358      Component Value Date/Time   CALCIUM 10.2 04/27/2020 1358   ALKPHOS 105 04/27/2020 1358   AST 16 04/27/2020 1358   ALT 15 04/27/2020 1358   BILITOT 0.4 04/27/2020 1358       RADIOGRAPHIC STUDIES: CT Chest W Contrast  Result Date: 04/30/2020 CLINICAL DATA:  Restaging non-small cell lung cancer. Status post left upper lobe wedge resection. Undergoing chemotherapy. EXAM: CT CHEST WITH CONTRAST TECHNIQUE: Multidetector CT imaging of the chest was performed during intravenous contrast administration. CONTRAST:  67m OMNIPAQUE IOHEXOL 300 MG/ML  SOLN COMPARISON:  09/01/2019 FINDINGS: Cardiovascular: The heart is normal in size. No pericardial effusion. Stable tortuosity, mild ectasia and advanced atherosclerotic calcifications involving the thoracic aorta. No dissection. The branch vessels are patent. Moderate calcifications. Calcifications also noted along round the aortic valve. Mediastinum/Nodes: Small scattered mediastinal and hilar lymph nodes but no mass or overt adenopathy. The esophagus is grossly normal. Small hiatal hernia noted. Lungs/Pleura: Surgical changes from a left upper lobe wedge resection. Soft tissue density (24 x 18 mm) surrounding the surgical staple line is not an unexpected finding on this baseline postoperative study. Attention on future scans is suggested. Stable underlying emphysematous changes with areas of pulmonary scarring. No new pulmonary lesions or worrisome pulmonary nodules to suggest metastatic disease. No pleural effusions or pulmonary edema. No pneumothorax. Upper Abdomen: No significant upper abdominal findings. Stable advanced aortic calcifications, cholelithiasis, small hepatic cysts, left renal cysts and atrophied right kidney. No adrenal gland lesions. Musculoskeletal: No chest wall mass, supraclavicular or axillary adenopathy. The bony thorax is intact. IMPRESSION: 1. Surgical  changes from a left upper lobe wedge resection. Soft tissue density surrounding the surgical staple line is not an unexpected finding on this baseline postoperative study. Attention on future scans is suggested. 2. Stable emphysematous changes and pulmonary scarring. No new pulmonary lesions or worrisome pulmonary nodules to suggest metastatic disease. 3. Stable advanced atherosclerotic calcifications involving the thoracic and abdominal aorta and branch vessels. 4. Cholelithiasis. 5. Emphysema and aortic atherosclerosis. Aortic Atherosclerosis (ICD10-I70.0) and Emphysema (ICD10-J43.9). Aortic Atherosclerosis (ICD10-I70.0) and Emphysema (ICD10-J43.9). Electronically Signed   By: PMarijo SanesM.D.   On: 04/30/2020 08:44  ASSESSMENT AND PLAN: This is a very pleasant 74 years old white male recently diagnosed with stage IIIa (T2 a, N2, M0) non-small cell lung cancer, adenocarcinoma presented with left upper lobe lung nodule and after resection the patient was found to have metastatic disease to the mediastinal lymphadenopathy.  This was diagnosed in March 2021.   He is currently undergoing adjuvant systemic chemotherapy with cisplatin, pemetrexed and pembrolizumab status post 2 cycles. His treatment was discontinued secondary to significant intolerability to this treatment with frequent admission to the hospital with enteritis, acute renal failure as well as pancytopenia. The patient is currently on observation and he is feeling fine with no concerning complaints. He had repeat CT scan of the chest performed recently.  I personally and independently reviewed the scan and discussed the results with the patient today. His scan showed no concerning findings for disease recurrence or metastasis. I also informed the patient about the new results regarding the benefit of treatment with Atezolizumab in the adjuvant setting of non-small cell lung cancer.  But he is not currently a candidate for this treatment. I  recommended for him to continue on observation with repeat CT scan of the chest in 4 months. For the hypomagnesemia, I will give the patient a refill of magnesium oxide. He was advised to call immediately if he has any concerning symptoms in the interval. The patient voices understanding of current disease status and treatment options and is in agreement with the current care plan. All questions were answered. The patient knows to call the clinic with any problems, questions or concerns. We can certainly see the patient much sooner if necessary.  Disclaimer: This note was dictated with voice recognition software. Similar sounding words can inadvertently be transcribed and may not be corrected upon review.

## 2020-05-01 ENCOUNTER — Other Ambulatory Visit: Payer: Self-pay | Admitting: *Deleted

## 2020-05-01 ENCOUNTER — Encounter: Payer: Self-pay | Admitting: *Deleted

## 2020-05-01 NOTE — Patient Outreach (Signed)
[facility]) Care Management [facility] CM Telephone Outreach, new patient- reassignment from previous RN CM  05/01/2020  DEONTE OTTING 06/15/1946 [id number]  Unsuccessful initial telephone outreach to [name], 74 y/o male referred to this [facility] RN CM 04/24/20 on re-assignment from previous [place].  Original referral received as a result of EMMI Red-Alert notification when patient had 2 hospitalizations in late July for enteritis/ sepsis related thought to be related to chemotherapy treatment for lung cancer.  Patient currently not taking chemotherapy; and is on surveillance monitoring; had oncology provider office visit 04/30/20.   Patient has history including, but not limited to, COPD; lung cancer with wedge resection in May 2021; DM; HTN/ HLD; enlarged prostate with urinary retention issues, requiring intermittent use of foley catheter.  HIPAA compliant voice mail message left for patient, requesting return call back.  Plan:  Will re-attempt [facility] CM telephone outreach within 4 business days if I do not hear back from patient/ caregiver first  Oneta Rack, RN, BSN, [facility]  [phone]

## 2020-05-02 LAB — CYTOLOGY - NON PAP

## 2020-05-03 DIAGNOSIS — H2511 Age-related nuclear cataract, right eye: Secondary | ICD-10-CM | POA: Diagnosis not present

## 2020-05-03 DIAGNOSIS — H2513 Age-related nuclear cataract, bilateral: Secondary | ICD-10-CM | POA: Diagnosis not present

## 2020-05-03 DIAGNOSIS — E113293 Type 2 diabetes mellitus with mild nonproliferative diabetic retinopathy without macular edema, bilateral: Secondary | ICD-10-CM | POA: Diagnosis not present

## 2020-05-03 DIAGNOSIS — H25013 Cortical age-related cataract, bilateral: Secondary | ICD-10-CM | POA: Diagnosis not present

## 2020-05-04 ENCOUNTER — Other Ambulatory Visit: Payer: Self-pay | Admitting: *Deleted

## 2020-05-04 ENCOUNTER — Encounter: Payer: Self-pay | Admitting: *Deleted

## 2020-05-04 NOTE — Patient Instructions (Signed)
Goals Addressed              This Visit's Progress   .  COMPLETED: Patient Stated wants to improve exercise . (pt-stated)        CARE PLAN ENTRY (see longitudinal plan of care for additional care plan information)  Current Barriers:  Marland Kitchen Knowledge Deficits related to lung cancer  Nurse Case Manager Clinical Goal(s):  Marland Kitchen Over the next 60 days, patient will not experience hospital admission. Hospital Admissions in last 6 months = 3 . Over the next 30 days, patient will attend all scheduled medical appointments: Eye MD, urologist, and primary MD  Interventions:  . Inter-disciplinary care team collaboration (see longitudinal plan of care) . Reviewed medications with patient and discussed changes . Discussed plans with patient for ongoing care management follow up and provided patient with direct contact information for care management team . Wife states they have received the [facility] Calendar and it is an excellent resource.   Patient Self Care Activities:  . Attends all scheduled provider appointments . Performs ADL's independently . Patient has resumed driving to appointments . Patient working on increasing exercise  Updated on 03/19/2020    05/04/20: Goal completed due to duplication  Oneta Rack, RN, BSN, [place] [facility]  [phone]      .  THN CM: Learn More About My Health        Follow Up Date 06/09/20    - tell my story and reason for my visit - make a list of questions - ask questions - repeat what I heard to make sure I understand - bring a list of my medicines to the visit - speak up when I don't understand    Why is this important?   The best way to learn about your health and care is by talking to the doctor and nurse.  They will answer your questions and give you information in the way that you like best.    Notes:     .  THN CM: Make and Keep All Appointments        Follow Up Date 06/09/20   - ask  family or friend for a ride - keep a calendar with appointment dates    Why is this important?   Part of staying healthy is seeing the doctor for follow-up care.  If you forget your appointments, there are some things you can do to stay on track.    Notes:   05/04/20: -- reviewed recent and upcoming provider appointments with patient's spouse/ caregiver; confirmed that patient continues to drive self and that spouse assists as indicated/ needed    .  THN CM: Matintain My Quality of Life        Follow Up Date 06/09/20    - complete a living will - discuss my treatment options with the doctor or nurse - make shared treatment decisions with doctor - name a health care proxy (decision maker)    Why is this important?   Having a long-term illness can be scary.  It can also be stressful for you and your caregiver.  These steps may help.    Notes:     .  THN CM: Track and Manage My Symptoms        Follow Up Date 06/09/20   - develop a rescue plan - eliminate symptom triggers at home - follow rescue plan if symptoms flare-up - keep follow-up appointments  Why is this important?   Tracking your symptoms and other information about your health helps your doctor plan your care.  Write down the symptoms, the time of day, what you were doing and what medicine you are taking.  You will soon learn how to manage your symptoms.     Notes:   05/04/20: -- Smoking cessation strategies discussed with caregiver/ spouse    .  THN CM: Track and Manage My Triggers        Follow Up Date 06/09/20    - eliminate smoking in my home and automobile    Why is this important?   Triggers are activities or things, like tobacco smoke or cold weather, that make your COPD (chronic obstructive pulmonary disease) flare-up.  Knowing these triggers helps you plan how to stay away from them.  When you cannot remove them, you can learn how to manage them.     Notes:   05/04/20: -- smoking cessation  encouraged; strategies to decrease smoking discussed with caregiver/ spouse       Coping with Quitting Smoking  Quitting smoking is a physical and mental challenge. You will face cravings, withdrawal symptoms, and temptation. Before quitting, work with your health care provider to make a plan that can help you cope. Preparation can help you quit and keep you from giving in. How can I cope with cravings? Cravings usually last for 5-10 minutes. If you get through it, the craving will pass. Consider taking the following actions to help you cope with cravings:  Keep your mouth busy: ? Chew sugar-free gum. ? Suck on hard candies or a straw. ? Brush your teeth.  Keep your hands and body busy: ? Immediately change to a different activity when you feel a craving. ? Squeeze or play with a ball. ? Do an activity or a hobby, like making bead jewelry, practicing needlepoint, or working with wood. ? Mix up your normal routine. ? Take a short exercise break. Go for a quick walk or run up and down stairs. ? Spend time in public places where smoking is not allowed.  Focus on doing something kind or helpful for someone else.  Call a friend or family member to talk during a craving.  Join a support group.  Call a quit line, such as 1-800-QUIT-NOW.  Talk with your health care provider about medicines that might help you cope with cravings and make quitting easier for you. How can I deal with withdrawal symptoms? Your body may experience negative effects as it tries to get used to not having nicotine in the system. These effects are called withdrawal symptoms. They may include:  Feeling hungrier than normal.  Trouble concentrating.  Irritability.  Trouble sleeping.  Feeling depressed.  Restlessness and agitation.  Craving a cigarette. To manage withdrawal symptoms:  Avoid places, people, and activities that trigger your cravings.  Remember why you want to quit.  Get plenty of  sleep.  Avoid coffee and other caffeinated drinks. These may worsen some of your symptoms. How can I handle social situations? Social situations can be difficult when you are quitting smoking, especially in the first few weeks. To manage this, you can:  Avoid parties, bars, and other social situations where people might be smoking.  Avoid alcohol.  Leave right away if you have the urge to smoke.  Explain to your family and friends that you are quitting smoking. Ask for understanding and support.  Plan activities with friends or family where smoking is  not an option. What are some ways I can cope with stress? Wanting to smoke may cause stress, and stress can make you want to smoke. Find ways to manage your stress. Relaxation techniques can help. For example:  Breathe slowly and deeply, in through your nose and out through your mouth.  Listen to soothing, relaxing music.  Talk with a family member or friend about your stress.  Light a candle.  Soak in a bath or take a shower.  Think about a peaceful place. What are some ways I can prevent weight gain? Be aware that many people gain weight after they quit smoking. However, not everyone does. To keep from gaining weight, have a plan in place before you quit and stick to the plan after you quit. Your plan should include:  Having healthy snacks. When you have a craving, it may help to: ? Eat plain popcorn, crunchy carrots, celery, or other cut vegetables. ? Chew sugar-free gum.  Changing how you eat: ? Eat small portion sizes at meals. ? Eat 4-6 small meals throughout the day instead of 1-2 large meals a day. ? Be mindful when you eat. Do not watch television or do other things that might distract you as you eat.  Exercising regularly: ? Make time to exercise each day. If you do not have time for a long workout, do short bouts of exercise for 5-10 minutes several times a day. ? Do some form of strengthening exercise, like weight  lifting, and some form of aerobic exercise, like running or swimming.  Drinking plenty of water or other low-calorie or no-calorie drinks. Drink 6-8 glasses of water daily, or as much as instructed by your health care provider. Summary  Quitting smoking is a physical and mental challenge. You will face cravings, withdrawal symptoms, and temptation to smoke again. Preparation can help you as you go through these challenges.  You can cope with cravings by keeping your mouth busy (such as by chewing gum), keeping your body and hands busy, and making calls to family, friends, or a helpline for people who want to quit smoking.  You can cope with withdrawal symptoms by avoiding places where people smoke, avoiding drinks with caffeine, and getting plenty of rest.  Ask your health care provider about the different ways to prevent weight gain, avoid stress, and handle social situations. This information is not intended to replace advice given to you by your health care provider. Make sure you discuss any questions you have with your health care provider. Document Revised: 05/22/2017 Document Reviewed: 06/06/2016 Elsevier Patient Education  2020 [place] Risks of Smoking Smoking cigarettes is very bad for your health. Tobacco smoke has over 200 known poisons in it. It contains the poisonous gases nitrogen oxide and carbon monoxide. There are over 60 chemicals in tobacco smoke that cause cancer. Smoking is difficult to quit because a chemical in tobacco, called nicotine, causes addiction or dependence. When you smoke and inhale, nicotine is absorbed rapidly into the bloodstream through your lungs. Both inhaled and non-inhaled nicotine may be addictive. What are the risks of cigarette smoke? Cigarette smokers have an increased risk of many serious medical problems, including:  Lung cancer.  Lung disease, such as pneumonia, bronchitis, and emphysema.  Chest pain (angina) and heart attack  because the heart is not getting enough oxygen.  Heart disease and peripheral blood vessel disease.  High blood pressure (hypertension).  Stroke.  Oral cancer, including cancer of the lip, mouth,  or voice box.  Bladder cancer.  Pancreatic cancer.  Cervical cancer.  Pregnancy complications, including premature birth.  Stillbirths and smaller newborn babies, birth defects, and genetic damage to sperm.  Early menopause.  Lower estrogen level for women.  Infertility.  Facial wrinkles.  Blindness.  Increased risk of broken bones (fractures).  Senile dementia.  Stomach ulcers and internal bleeding.  Delayed wound healing and increased risk of complications during surgery.  Even smoking lightly shortens your life expectancy by several years. Because of secondhand smoke exposure, children of smokers have an increased risk of the following:  Sudden infant death syndrome (SIDS).  Respiratory infections.  Lung cancer.  Heart disease.  Ear infections. What are the benefits of quitting? There are many health benefits of quitting smoking. Here are some of them:  Within days of quitting smoking, your risk of having a heart attack decreases, your blood flow improves, and your lung capacity improves. Blood pressure, pulse rate, and breathing patterns start returning to normal soon after quitting.  Within months, your lungs may clear up completely.  Quitting for 10 years reduces your risk of developing lung cancer and heart disease to almost that of a nonsmoker.  People who quit may see an improvement in their overall quality of life. How do I quit smoking?     Smoking is an addiction with both physical and psychological effects, and longtime habits can be hard to change. Your health care provider can recommend:  Programs and community resources, which may include group support, education, or talk therapy.  Prescription medicines to help reduce cravings.  Nicotine  replacement products, such as patches, gum, and nasal sprays. Use these products only as directed. Do not replace cigarette smoking with electronic cigarettes, which are commonly called e-cigarettes. The safety of e-cigarettes is not known, and some may contain harmful chemicals.  A combination of two or more of these methods. Where to find more information  American Lung Association: www.lung.org  American Cancer Society: www.cancer.org Summary  Smoking cigarettes is very bad for your health. Cigarette smokers have an increased risk of many serious medical problems, including several cancers, heart disease, and stroke.  Smoking is an addiction with both physical and psychological effects, and longtime habits can be hard to change.  By stopping right away, you can greatly reduce the risk of medical problems for you and your family.  To help you quit smoking, your health care provider can recommend programs, community resources, prescription medicines, and nicotine replacement products such as patches, gum, and nasal sprays. This information is not intended to replace advice given to you by your health care provider. Make sure you discuss any questions you have with your health care provider. Document Revised: 09/10/2017 Document Reviewed: 06/13/2016  Oneta Rack, RN, BSN, [place] [facility]  [phone]   Elsevier Patient Education  2020 [place].

## 2020-05-04 NOTE — Patient Outreach (Signed)
[facility]) Care Management THN CM Telephone Outreach, new patient- reassignment from previous [place]  05/04/2020  BAUER AUSBORN 1945-09-21 [id number]  Successful second telephone outreach to [name], 74 y/o male referred to this [facility] RN CM 04/24/20 on re-assignment from previous [place].  Original referral received as a result of EMMI Red-Alert notification when patient had 2 hospitalizations in late July for enteritis/ sepsis related thought to be related to chemotherapy treatment for lung cancer.  Patient currently not taking chemotherapy; and is on surveillance monitoring; had oncology provider office visit 04/30/20.   Patient has history including, but not limited to, COPD; lung cancer with wedge resection in May 2021; DM; HTN/ HLD; enlarged prostate with urinary retention issues, requiring intermittent use of foley catheter.  HIPAA/ identity verified with patient's spouse, Stanton Kidney, on [facility] DPR; purpose of call today discussed with Stanton Kidney and she provides ongoing consent for [facility] CM involvement in patient's care.  Reports patient is "doing vastly better," stating that he had a oncology provider office visit earlier this week "got a good report; they said he was stable" and she denies current clinical concerns.  Reports patient remains independent in ADL's and IADL's and has resumed driving self to errands/ appointments.  Spouse assists with care needs/ transportation if/ as indicated.  Recent and upcoming provider appointments reviewed with spouse, who verbalizes accurate understanding of all with plans to attend all as scheduled.  Reports patient continues to have indwelling foley catheter due to enlarged prostate; states recent efforts to transition to I/O self-catherization were unsuccessful.  She denies concerns with foley, states patient has had for over a year and she is able to verbalize signs/ symptoms infection without prompting.    Reports patient has  tablet from [place] provider; she is unsure of the details around [facility] program, but states patient is monitored three times per week and his weights/ blood pressures at home are automatically sent virtually to insurance provider.  Reports "everything stable" at home- states weight ranges consistently between 170-172 lbs.  Spouse able to verbalize accurate understanding around rationale for daily weight monitoring at home, along with action plan for weight gain.  Reports that she and patient are both continuing efforts around smoking cessation- we discussed in depth and caregiver was provided strategies to consider for ongoing efforts.  Caregiver denies further issues, concerns, or problems today.  I provided/ confirmed that she/ patient have my direct phone number, the main [facility] CM office phone number, and the [facility] CM 24-hour nurse advice phone number should issues arise prior to next scheduled THN CM outreach by phone next month, post-upcoming scheduled provider appointments per caregiver request/ preference.  Encouraged patient/ caregiver to contact me directly if needs, questions, issues, or concerns arise prior to next scheduled outreach; agreed to do so.  Plan:  Patient will take medications as prescribed and will attend all scheduled provider appointments  Patient will promptly notify care providers for any new concerns/ issues/ problems that arise  Patient will continue monitoring/ recording weights and blood pressures at home using insurance provided tablet  Patient/ caregiver will continue their efforts to decrease/ stop smoking   THN CM outreach to continue with scheduled phone call next month, post-upcoming provider appointments, unless indicated sooner  Oneta Rack, RN, BSN, [place] [facility]  [phone]

## 2020-05-06 DIAGNOSIS — I959 Hypotension, unspecified: Secondary | ICD-10-CM | POA: Insufficient documentation

## 2020-05-06 NOTE — Progress Notes (Deleted)
Cardiology Office Note   Date:  05/06/2020   ID:  Chad Gomez, Chad Gomez 1946/05/07, MRN [id number]  PCP:  [name], MD  Cardiologist:   Minus Breeding, MD Referring:  Ceasar Mons, MD  No chief complaint on file.     History of Present Illness: Chad Gomez is a 74 y.o. male who is sent by Dr. Servando Snare for preop clearance prior to resection of a small cell lung cancer.  He is seen by Dr. Lamonte Sakai for evaluation of severe COPD.  He had a preop myoview that was negative and had his lobectomy.  He had some hypotension and saw [place] back in August for this.  He had Norvasc and Lozel held.   ***   I saw him over three years ago for evaluation of an abnormal EKG.  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  The patient had a perfusion study that was negative for ischemia.   He has had no recent cardiovascular symptoms.  He is limited by hip pain and can only walk about half a block.  He is relatively sedentary.  He does not have any chest pressure, neck or arm discomfort.  He does not describe excessive shortness of breath, PND or orthopnea.  He does not have palpitations, presyncope or syncope.  His biggest recent problem has been bladder outlet obstruction and he has an indwelling Foley catheter.  Past Medical History:  Diagnosis Date  . BPH (benign prostatic hypertrophy)    weak stream  . Chronic kidney disease   . Complication of anesthesia    urinary retention  . COPD (chronic obstructive pulmonary disease) ([place])   . Diabetes mellitus without complication ([place])    Type II  . History of bladder stone   . History of colon polyps    08/2013   pre-canerous  . History of kidney stones   . History of melanoma excision    LEFT EAR  . Hyperlipidemia   . Hypertension   . Iron deficiency   . Lung cancer, lingula ([place]) 11/01/2019  . Open wound    left lower jaw; followed by ENT  . Peripheral vascular disease ([place])   . Right ureteral stone    . Shingles 05-15-2015    Past Surgical History:  Procedure Laterality Date  . ABDOMINAL AORTAGRAM  11/01/2014   Procedure: Abdominal Aortagram;  Surgeon: Serafina Mitchell, MD;  Location: [place] [facility];  Service: Cardiovascular;;  . AORTA - BILATERAL FEMORAL ARTERY BYPASS GRAFT N/A 12/28/2014   Procedure: AORTOBIFEMORAL BYPASS GRAFT;  Surgeon: Serafina Mitchell, MD;  Location: [place];  Service: Vascular;  Laterality: N/A;  . BRONCHIAL BRUSHINGS  09/06/2019   Procedure: BRONCHIAL BRUSHINGS;  Surgeon: Collene Gobble, MD;  Location: [facility] ENDOSCOPY;  Service: Pulmonary;;  left upper lobe nodule  . BRONCHIAL WASHINGS  09/06/2019   Procedure: BRONCHIAL WASHINGS;  Surgeon: Collene Gobble, MD;  Location: [facility] ENDOSCOPY;  Service: Pulmonary;;  . COLONOSCOPY W/ POLYPECTOMY  09-13-2013  . CYSTOLITHALOPAXY OF BLADDER STONE/ TRANSRECTAL ULTRASOUND PROSTATE BX  08-19-2005  . CYSTOSCOPY W/ RETROGRADES Right 12/02/2012   Procedure: CYSTOSCOPY WITH RETROGRADE PYELOGRAM;  Surgeon: Malka So, MD;  Location: [facility];  Service: Urology;  Laterality: Right;  . CYSTOSCOPY W/ RETROGRADES Bilateral 10/13/2013   Procedure: CYSTOSCOPY WITH RETROGRADE PYELOGRAM;  Surgeon: Irine Seal, MD;  Location: [name];  Service: Urology;  Laterality: Bilateral;  . CYSTOSCOPY  W/ URETERAL STENT PLACEMENT Left 04/11/2014   Procedure: CYSTOSCOPY WITH STENT REPLACEMENT;  Surgeon: Malka So, MD;  Location: [facility];  Service: Urology;  Laterality: Left;  . CYSTOSCOPY WITH LITHOLAPAXY Right 12/02/2012   Procedure: CYSTOSCOPY WITH stone extraction;  Surgeon: Malka So, MD;  Location: [facility];  Service: Urology;  Laterality: Right;  . CYSTOSCOPY WITH LITHOLAPAXY Right 10/13/2013   Procedure: CYSTOSCOPY WITH LITHOLAPAXY, removal of urethral stone with basket;  Surgeon: Irine Seal, MD;  Location: [facility];  Service: Urology;  Laterality: Right;  .  CYSTOSCOPY WITH RETROGRADE PYELOGRAM, URETEROSCOPY AND STENT PLACEMENT Left 03/21/2014   Procedure: CYSTOSCOPY WITH RETROGRADE PYELOGRAM, AND LEFT STENT PLACEMENT;  Surgeon: Alexis Frock, MD;  Location: [facility];  Service: Urology;  Laterality: Left;  . CYSTOSCOPY WITH RETROGRADE PYELOGRAM, URETEROSCOPY AND STENT PLACEMENT  09/09/2016   Procedure: CYSTOSCOPY WITH RIGHT  RETROGRADE PYELOGRAM, URETEROSCOPY WITH LASER BASKET EXTRACTION OF STONES  AND STENT PLACEMENT;  Surgeon: Irine Seal, MD;  Location: [facility];  Service: Urology;;  . Consuela Mimes WITH URETEROSCOPY Left 04/11/2014   Procedure: CYSTOSCOPY WITH URETEROSCOPY/ STONE EXTRACTION;  Surgeon: Malka So, MD;  Location[facility];  Service: Urology;  Laterality: Left;  . FINE NEEDLE ASPIRATION  09/06/2019   Procedure: FINE NEEDLE ASPIRATION;  Surgeon: Collene Gobble, MD;  Location: [facility] ENDOSCOPY;  Service: Pulmonary;;  left upper lobe - lung  . HOLMIUM LASER APPLICATION Right 2/83/1517   Procedure: HOLMIUM LASER APPLICATION;  Surgeon: Malka So, MD;  Location: [facility];  Service: Urology;  Laterality: Right;  . HOLMIUM LASER APPLICATION Left 61/60/7371   Procedure: HOLMIUM LASER APPLICATION;  Surgeon: Malka So, MD;  Location: [facility];  Service: Urology;  Laterality: Left;  . HOLMIUM LASER APPLICATION Right 0/62/6948   Procedure: HOLMIUM LASER APPLICATION;  Surgeon: Irine Seal, MD;  [facility]  Service: Urology;  Laterality: Right;  . INGUINAL HERNIA REPAIR Right 1954  . INTERCOSTAL NERVE BLOCK Left 10/28/2019   Procedure: Intercostal Nerve Block;  Surgeon: [name], MD;  Location: [place];  Service: Thoracic;  Laterality: Left;  . LITHOTRIPSY  2007 & 02/2013  . LUNG BIOPSY  09/06/2019   Procedure: LUNG BIOPSY;  Surgeon: Collene Gobble, MD;  Location: [facility] ENDOSCOPY;  Service: Pulmonary;;  left upper lob lung  . [place]  . MICROLARYNGOSCOPY Right 02/18/2016   Procedure: MICRO DIRECT LARYNGOSCOPY EXCISIONAL BIOPSY OF RIGHT VOCAL CORD MASS;  Surgeon: Leta Baptist, MD;  Location: [place];  Service: ENT;  Laterality: Right;  . [place]   left ear  . MULTIPLE TOOTH EXTRACTIONS  1994  . NEPHROLITHOTOMY Left 01/27/2013   Procedure: NEPHROLITHOTOMY PERCUTANEOUS FIRST LOOK;  Surgeon: Malka So, MD;  [facility];  Service: Urology;  Laterality: Left;  . NODE DISSECTION  10/28/2019   Procedure: Node Dissection;  Surgeon: [name], MD;  Location: [facility] OR;  Service: Thoracic;;  . PERIPHERAL VASCULAR CATHETERIZATION N/A 11/01/2014   Procedure: Lower Extremity Angiography;  Surgeon: Serafina Mitchell, MD;  Location: [place] [facility];  Service: Cardiovascular;  Laterality: N/A;  . URETEROSCOPY Right 12/02/2012   Procedure: RIGHT URETEROSCOPY STONE EXTRACTION WITH  STENT PLACEMENT;  Surgeon: Malka So, MD;  Location[facility];  Service: Urology;  Laterality: Right;  Marland Kitchen VIDEO BRONCHOSCOPY  10/28/2019   VIDEO BRONCHOSCOPY  WITH ENDOBRONCHIAL NAVIGATION WITH LUNG MARKINGS (N/A  . VIDEO BRONCHOSCOPY WITH ENDOBRONCHIAL NAVIGATION N/A 09/06/2019   Procedure: VIDEO BRONCHOSCOPY WITH ENDOBRONCHIAL NAVIGATION;  Surgeon: Collene Gobble, MD;  Location: [place] ENDOSCOPY;  Service: Pulmonary;  Laterality: N/A;  . VIDEO BRONCHOSCOPY WITH ENDOBRONCHIAL NAVIGATION N/A 10/28/2019   Procedure: VIDEO BRONCHOSCOPY WITH ENDOBRONCHIAL NAVIGATION WITH LUNG MARKINGS;  Surgeon: [name], MD;  Location: [place];  Service: Thoracic;  Laterality: N/A;  . WEDGE RESECTION  10/28/2019   LEFT UPPER LOBE     Current Outpatient Medications  Medication Sig Dispense Refill  . acetaminophen (TYLENOL) 500 MG tablet Take 2 tablets (1,000 mg total) by mouth every 6 (six) hours as needed for mild pain. 30 tablet 0  . albuterol (VENTOLIN HFA) 108 (90 Base) MCG/ACT inhaler Inhale 2 puffs into the lungs  every 6 (six) hours as needed for wheezing or shortness of breath. 8 g 3  . aspirin EC 81 MG tablet Take 81 mg by mouth daily.    Marland Kitchen atorvastatin (LIPITOR) 40 MG tablet Take 40 mg by mouth daily.    . Cholecalciferol (VITAMIN D-3) 1000 units CAPS Take 1,000 Units by mouth daily.     Marland Kitchen ezetimibe (ZETIA) 10 MG tablet Take 5 mg by mouth daily.    . finasteride (PROSCAR) 5 MG tablet Take 5 mg by mouth daily.     . fluticasone (FLONASE) 50 MCG/ACT nasal spray Place 2 sprays into both nostrils daily.     . folic acid (FOLVITE) 1 MG tablet Take 1 mg by mouth daily.    Marland Kitchen gabapentin (NEURONTIN) 100 MG capsule Take 100 mg by mouth 3 (three) times daily.     . K Phos Mono-Sod Phos Di & Mono (K-PHOS-NEUTRAL) [phone] MG TABS Take 2 tablets by mouth in the morning and at bedtime. 120 tablet 0  . magnesium oxide (MAG-OX) 400 (241.3 Mg) MG tablet Take 1 tablet (400 mg total) by mouth 2 (two) times daily. 60 tablet 0  . metFORMIN (GLUCOPHAGE-XR) 500 MG 24 hr tablet Take 500-1,000 mg by mouth See admin instructions. Take 500 mg every morning and take 1000 mg at supper    . nicotine (NICODERM CQ - DOSED IN MG/24 HOURS) 21 mg/24hr patch Place 1 patch (21 mg total) onto the skin daily. 28 patch 0  . Nutritional Supplements (FEEDING SUPPLEMENT, KATE FARMS STANDARD 1.4,) LIQD liquid Take 325 mLs by mouth 2 (two) times daily between meals. 325 mL 0  . potassium citrate (UROCIT-K) 10 MEQ (1080 MG) SR tablet Take 10 mEq by mouth in the morning and at bedtime.     . tamsulosin (FLOMAX) 0.4 MG CAPS capsule Take 0.4 mg by mouth daily after supper.     . Tiotropium Bromide-Olodaterol (STIOLTO RESPIMAT) 2.5-2.5 MCG/ACT AERS Inhale 2 puffs into the lungs daily. 1 Inhaler 11  . traMADol (ULTRAM) 50 MG tablet Take 1 tablet (50 mg total) by mouth every 6 (six) hours as needed (mild pain). 20 tablet 0   No current facility-administered medications for this visit.    Allergies:   Morphine and related, Tape, Amoxicillin, and  Naproxen    ROS:  Please see the history of present illness.   Otherwise, review of systems are positive for ***.   All other systems are reviewed and negative.    PHYSICAL EXAM: VS:  There were no vitals taken for this visit. , BMI There is no height or weight on file to calculate BMI. GENERAL:  Well appearing NECK:  No jugular venous distention, waveform within normal limits, carotid upstroke brisk and symmetric, no bruits, no thyromegaly LUNGS:  Clear to auscultation bilaterally CHEST:  Unremarkable HEART:  PMI not displaced or sustained,S1 and S2 within normal limits, no S3, no S4, no clicks, no rubs, *** murmurs ABD:  Flat, positive bowel sounds normal in frequency in pitch, no bruits, no rebound, no guarding, no midline pulsatile mass, no hepatomegaly, no splenomegaly EXT:  2 plus pulses throughout, no edema, no cyanosis no clubbing    ***GENERAL:  Well appearing HEENT:  Pupils equal round and reactive, fundi not visualized, oral mucosa unremarkable NECK:  No jugular venous distention, waveform within normal limits, carotid upstroke brisk and symmetric, no bruits, no thyromegaly LYMPHATICS:  No cervical, inguinal adenopathy LUNGS: Decreased bilateral breath sounds BACK:  No CVA tenderness CHEST:  Unremarkable HEART:  PMI not displaced or sustained,S1 and S2 within normal limits, no S3, no S4, no clicks, no rubs, no murmurs ABD:  Flat, positive bowel sounds normal in frequency in pitch, no bruits, no rebound, no guarding, no midline pulsatile mass, no hepatomegaly, no splenomegaly, well-healed surgical scar EXT:  2 plus pulses palpable pulses, decreased dorsalis pedis and posterior heels bilaterally, no edema, no cyanosis no clubbing SKIN:  No rashes no nodules NEURO:  Cranial nerves II through XII grossly intact, motor grossly intact throughout PSYCH:  Cognitively intact, oriented to person place and time    EKG:  EKG is *** ordered today. The ekg ordered today demonstrates  sinus rhythm, rate ***, axis within normal limits, intervals within normal limits, RSR prime V1 V2, low voltage chest leads.   Recent Labs: 01/30/2020: TSH 2.649 04/27/2020: ALT 15; BUN 15; Creatinine 0.96; Hemoglobin 13.1; Magnesium 1.5; Platelet Count 181; Potassium 4.1; Sodium 139    Lipid Panel    Component Value Date/Time   TRIG 137 01/02/2020 0459      Wt Readings from Last 3 Encounters:  04/30/20 175 lb 14.4 oz (79.8 kg)  03/08/20 160 lb (72.6 kg)  02/21/20 155 lb (70.3 kg)      Other studies Reviewed: Additional studies/ records that were reviewed today include: *** Review of the above records demonstrates:  Please see elsewhere in the note.     ASSESSMENT AND PLAN:   HYPOTENSION:  ***  Blood pressure is controlled.  No change in therapy.  DYSLIPIDEMIA: LDL was ***  recently was 44 with an HDL of 42.  No change in therapy.  TOBACCO ABUSE:  *** He has tried everything to stop smoking including Chantix and hypnosis.  He is hopelessly addicted.  DM: A1c is *** 6.2.  No change in therapy.  Current medicines are reviewed at length with the patient today.  The patient does not have concerns regarding medicines.  The following changes have been made:  ***  Labs/ tests ordered today include:   ***  No orders of the defined types were placed in this encounter.    Disposition:   FU with me ***     Signed, Minus Breeding, MD  05/06/2020 8:41 PM    [facility]

## 2020-05-07 ENCOUNTER — Ambulatory Visit: Payer: Medicare Other | Admitting: Cardiology

## 2020-05-07 DIAGNOSIS — E785 Hyperlipidemia, unspecified: Secondary | ICD-10-CM

## 2020-05-07 DIAGNOSIS — I959 Hypotension, unspecified: Secondary | ICD-10-CM

## 2020-05-28 ENCOUNTER — Other Ambulatory Visit: Payer: Self-pay

## 2020-05-28 ENCOUNTER — Encounter: Payer: Self-pay | Admitting: Pulmonary Disease

## 2020-05-28 ENCOUNTER — Ambulatory Visit: Payer: Medicare Other | Admitting: Pulmonary Disease

## 2020-05-28 VITALS — BP 130/62 | HR 98 | Temp 97.5°F | Ht 70.0 in | Wt 178.2 lb

## 2020-05-28 DIAGNOSIS — Z23 Encounter for immunization: Secondary | ICD-10-CM

## 2020-05-28 DIAGNOSIS — C3492 Malignant neoplasm of unspecified part of left bronchus or lung: Secondary | ICD-10-CM

## 2020-05-28 DIAGNOSIS — J449 Chronic obstructive pulmonary disease, unspecified: Secondary | ICD-10-CM

## 2020-05-28 NOTE — Patient Instructions (Addendum)
COPD --CONTINUE Stioloto TWO puffs ONCE a day. Patient assistance form completed. --Refer to Pulmonary Rehab  StageIIIaadenocarcinoma of the left lung --Management per Oncology  Follow-up in 3 months

## 2020-05-28 NOTE — Progress Notes (Signed)
Subjective:   PATIENT ID: Chad Gomez GENDER: male DOB: 26-Dec-1945, MRN: [id number]   HPI  Chief Complaint  Patient presents with  . Follow-up    feeling better shortness of breath when walking  currenly no oxygen needs, non productive cough    Reason for Visit: Follow-up   [name] is a 74 year old male active smoker with very severe COPD/emphysema who presents with follow-up.   Since our last visit, he was diagnosed with stageIIIaadenocarcinoma of the left lung in March 2021 and underwent surgical resection and chemotherapy. He completed two cycles before this was discontinued due to intolerance (enteritis, renal failure causing frequent admissions) and is currently under observation with oncology.  He is working on his weight gain and has good appetite. Uses a cane for ambulation. He does low-impact strengthening exercises. Does walk within the store with his wife but not high energy aerobic exercise. He is compliant with his Stioloto and feels this inhaler has eliminated nearly all his symptoms. Rarely coughs, wheezes. Has shortness of breath but feels this is due to his recent cancer.  Social History: Currently smoker 1ppd. Previously 2ppd.  I have personally reviewed patient's past medical/family/social history, allergies, current medications.  Past Medical History:  Diagnosis Date  . BPH (benign prostatic hypertrophy)    weak stream  . Chronic kidney disease   . Complication of anesthesia    urinary retention  . COPD (chronic obstructive pulmonary disease) ([place])   . Diabetes mellitus without complication ([place])    Type II  . History of bladder stone   . History of colon polyps    08/2013   pre-canerous  . History of kidney stones   . History of melanoma excision    LEFT EAR  . Hyperlipidemia   . Hypertension   . Iron deficiency   . Lung cancer, lingula ([place]) 11/01/2019  . Open  wound    left lower jaw; followed by ENT  . Peripheral vascular disease ([place])   . Right ureteral stone   . Shingles 05-15-2015      Outpatient Medications Prior to Visit  Medication Sig Dispense Refill  . acetaminophen (TYLENOL) 500 MG tablet Take 2 tablets (1,000 mg total) by mouth every 6 (six) hours as needed for mild pain. 30 tablet 0  . albuterol (VENTOLIN HFA) 108 (90 Base) MCG/ACT inhaler Inhale 2 puffs into the lungs every 6 (six) hours as needed for wheezing or shortness of breath. 8 g 3  . aspirin EC 81 MG tablet Take 81 mg by mouth daily.    Marland Kitchen atorvastatin (LIPITOR) 40 MG tablet Take 40 mg by mouth daily.    . Cholecalciferol (VITAMIN D-3) 1000 units CAPS Take 1,000 Units by mouth daily.     Marland Kitchen ezetimibe (ZETIA) 10 MG tablet Take 5 mg by mouth daily.    . finasteride (PROSCAR) 5 MG tablet Take 5 mg by mouth daily.     . fluticasone (FLONASE) 50 MCG/ACT nasal spray Place 2 sprays into both nostrils daily.     . folic acid (FOLVITE) 1 MG tablet Take 1 mg by mouth daily.    Marland Kitchen gabapentin (NEURONTIN) 100 MG capsule Take 100 mg by mouth 3 (three) times daily.     . K Phos Mono-Sod Phos Di & Mono (K-PHOS-NEUTRAL) [phone] MG TABS Take 2 tablets by mouth in the morning and at bedtime. 120 tablet 0  . magnesium oxide (MAG-OX) 400 (241.3 Mg) MG tablet Take 1 tablet (400  mg total) by mouth 2 (two) times daily. 60 tablet 0  . metFORMIN (GLUCOPHAGE-XR) 500 MG 24 hr tablet Take 500-1,000 mg by mouth See admin instructions. Take 500 mg every morning and take 1000 mg at supper    . Nutritional Supplements (FEEDING SUPPLEMENT, KATE FARMS STANDARD 1.4,) LIQD liquid Take 325 mLs by mouth 2 (two) times daily between meals. 325 mL 0  . potassium citrate (UROCIT-K) 10 MEQ (1080 MG) SR tablet Take 10 mEq by mouth in the morning and at bedtime.     . tamsulosin (FLOMAX) 0.4 MG CAPS capsule Take 0.4 mg by mouth daily after supper.     . Tiotropium Bromide-Olodaterol (STIOLTO RESPIMAT) 2.5-2.5 MCG/ACT  AERS Inhale 2 puffs into the lungs daily. 1 Inhaler 11  . traMADol (ULTRAM) 50 MG tablet Take 1 tablet (50 mg total) by mouth every 6 (six) hours as needed (mild pain). 20 tablet 0  . nicotine (NICODERM CQ - DOSED IN MG/24 HOURS) 21 mg/24hr patch Place 1 patch (21 mg total) onto the skin daily. 28 patch 0   No facility-administered medications prior to visit.    Review of Systems  Constitutional: Negative for chills, diaphoresis, fever, malaise/fatigue and weight loss.  HENT: Negative for congestion.   Respiratory: Positive for shortness of breath. Negative for cough, hemoptysis, sputum production and wheezing.   Cardiovascular: Negative for chest pain, palpitations and leg swelling.     Objective:   Vitals:   05/28/20 1057  BP: 130/62  Pulse: 98  Temp: (!) 97.5 F (36.4 C)  TempSrc: Temporal  SpO2: 96%  Weight: 178 lb 3.2 oz (80.8 kg)  Height: 5\' 10"  (1.778 m)   SpO2: 96 % O2 Device: None (Room air)  Physical Exam: General: Elderly, well-appearing, no acute distress HENT: [place], AT Eyes: EOMI, no scleral icterus Respiratory: Clear to auscultation bilaterally.  No crackles, wheezing or rales Cardiovascular: RRR, -M/R/G, no JVD Extremities:-Edema,-tenderness Neuro: AAO x4, CNII-XII grossly intact Psych: Normal mood, normal affect  Data Reviewed:  Imaging: CT Chest Lung Screen 08/25/16 - RUL apical lung nodule measuring 1.41mm. RLL 69mm calcified granuloma. Moderate emphysema. CT Chest Lung Screen 08/24/17 - Right apical lung nodule measuring 2.66mm. RLL 1.44mm calcified granuloma. Left apical pleural-parenchymal scarring. Moderate emphysema. CT Chest 04/27/20 - S/p left upper lobe wedge resection with surgical staple line. No interval pulmonary nodules development. Emphysema.   PFT: 09/02/17 Spirometry FVC 1.6 (36%) FEV1 1.0 (31%) Ratio 63  Interpretation: Very severe obstructive defect  Labs: CBC    Component Value Date/Time   WBC 7.1 04/27/2020 1358   WBC 4.0 01/20/2020  0554   RBC 4.23 04/27/2020 1358   HGB 13.1 04/27/2020 1358   HCT 39.5 04/27/2020 1358   PLT 181 04/27/2020 1358   MCV 93.4 04/27/2020 1358   MCH 31.0 04/27/2020 1358   MCHC 33.2 04/27/2020 1358   RDW 12.0 04/27/2020 1358   LYMPHSABS 1.7 04/27/2020 1358   MONOABS 0.8 04/27/2020 1358   EOSABS 0.6 (H) 04/27/2020 1358   BASOSABS 0.1 04/27/2020 1358   BMET    Component Value Date/Time   NA 139 04/27/2020 1358   K 4.1 04/27/2020 1358   CL 102 04/27/2020 1358   CO2 31 04/27/2020 1358   GLUCOSE 108 (H) 04/27/2020 1358   BUN 15 04/27/2020 1358   CREATININE 0.96 04/27/2020 1358   CALCIUM 10.2 04/27/2020 1358   GFRNONAA >60 04/27/2020 1358   GFRAA >60 01/30/2020 0854   Imaging, labs and test noted above have been reviewed  independently by me.    Assessment & Plan:   Discussion: 74 year old male with very severe COPD/emphysema who presents for follow-up. Well controlled on asthma. S/p diagnosis and resection of left upper lung adenocarcinoma. Discussed benefits of Pulmonary Rehab. After addressing questions, patient expresses interest in referral.  COPD  - well controlled, no exacerbation --CONTINUE Stioloto TWO puffs ONCE a day --Refer to Pulmonary Rehab  StageIIIaadenocarcinoma of the left lung --Management per Oncology  Allergic Rhinitis --CONTINUE Zyrtec daily as needed --CONTINUE Flonase as directed. Consider alternating pointing to your frontal sinuses vs your maxillary sinuses --Nasal rinses as needed before administration for Flonase   Health Maintenance Immunization History  Administered Date(s) Administered  . Pneumococcal Conjugate-13 09/02/2016  Give pneumonia vaccine  CT Lung Screen - not qualified  Orders Placed This Encounter  Procedures  . Pneumococcal polysaccharide vaccine 23-valent greater than or equal to 2yo subcutaneous/IM  . AMB referral to pulmonary rehabilitation    Referral Priority:   Routine    Referral Type:   Consultation    Number of  Visits Requested:   1  No orders of the defined types were placed in this encounter.   Return in about 3 months (around 08/26/2020).   I have spent a total time of 31-minutes on the day of the appointment reviewing prior documentation, coordinating care and discussing medical diagnosis and plan with the patient/family. Imaging, labs and tests included in this note have been reviewed and interpreted independently by me.  [place], MD [facility] 05/28/2020 11:01 AM  Office Number [phone]

## 2020-05-30 DIAGNOSIS — H2511 Age-related nuclear cataract, right eye: Secondary | ICD-10-CM | POA: Diagnosis not present

## 2020-05-30 DIAGNOSIS — H25811 Combined forms of age-related cataract, right eye: Secondary | ICD-10-CM | POA: Diagnosis not present

## 2020-06-06 DIAGNOSIS — R338 Other retention of urine: Secondary | ICD-10-CM | POA: Diagnosis not present

## 2020-06-07 ENCOUNTER — Encounter: Payer: Medicare Other | Admitting: Cardiothoracic Surgery

## 2020-06-07 ENCOUNTER — Encounter (HOSPITAL_COMMUNITY): Payer: Self-pay | Admitting: *Deleted

## 2020-06-07 NOTE — Progress Notes (Signed)
Received referral from Dr. Loanne Drilling for this pt to participate in pulmonary rehab with the the diagnosis of COPD stage 4.  Pt with PFT completed on 2/15 which showed FEV1FVC 50 with FEV1 59 predicted post Bronch. Clinical review of pt follow up appt on  Pulmonary office note on 12/6 as well as follow up note with Dr. Mohamed/Oncology and Dr Servando Snare CV surgeon who performed wedge resection .  Pt with Covid Risk Score - 7. Pt appropriate for scheduling for Pulmonary rehab.  Will forward to support staff for scheduling and verification of insurance eligibility/benefits with pt consent. Cherre Huger, BSN Cardiac and Training and development officer

## 2020-06-08 ENCOUNTER — Encounter: Payer: Self-pay | Admitting: *Deleted

## 2020-06-08 ENCOUNTER — Other Ambulatory Visit: Payer: Self-pay | Admitting: *Deleted

## 2020-06-08 NOTE — Patient Outreach (Signed)
[facility]) Care Management [facility] CM Telephone Outreach Unsuccessful (consecutive) outreach attempt # 1- previously engaged patient/ caregiver  06/08/2020  MARGARITA BOBROWSKI 1946-03-24 [id number]  Unsuccessful telephone outreach to [name], 74 y/o male referred to this [facility] RN CM 04/24/20 on re-assignment from previous [place].  Original referral received as a result of EMMI Red-Alert notification when patient had 2 hospitalizations in late July forenteritis/sepsis relatedthought to be relatedtochemotherapytreatment for lung cancer.Patient currently not taking chemotherapy; and is on surveillance monitoring; had oncology provider office visit 04/30/20.  Patient has history including, but not limited to, COPD; lung cancerwith wedge resection in May 2021; DM; HTN/ HLD; enlarged prostate with urinary retention issues, requiring intermittent use of foley catheter.  HIPAA compliant voice mail message left for patient, requesting return call back.  Plan:  Will re-attempt [facility] CM telephone outreach within 4 business days if I do not hear back from patient/ caregiver first  Oneta Rack, RN, BSN, [facility]  [phone]

## 2020-06-11 DIAGNOSIS — I7090 Unspecified atherosclerosis: Secondary | ICD-10-CM | POA: Diagnosis not present

## 2020-06-11 DIAGNOSIS — E11319 Type 2 diabetes mellitus with unspecified diabetic retinopathy without macular edema: Secondary | ICD-10-CM | POA: Diagnosis not present

## 2020-06-11 DIAGNOSIS — Z85118 Personal history of other malignant neoplasm of bronchus and lung: Secondary | ICD-10-CM | POA: Diagnosis not present

## 2020-06-11 DIAGNOSIS — I10 Essential (primary) hypertension: Secondary | ICD-10-CM | POA: Diagnosis not present

## 2020-06-12 ENCOUNTER — Other Ambulatory Visit: Payer: Self-pay | Admitting: Pulmonary Disease

## 2020-06-12 MED ORDER — STIOLTO RESPIMAT 2.5-2.5 MCG/ACT IN AERS
2.0000 | INHALATION_SPRAY | Freq: Every day | RESPIRATORY_TRACT | 3 refills | Status: DC
Start: 2020-06-12 — End: 2020-06-18

## 2020-06-13 ENCOUNTER — Other Ambulatory Visit: Payer: Self-pay | Admitting: *Deleted

## 2020-06-13 ENCOUNTER — Encounter: Payer: Self-pay | Admitting: *Deleted

## 2020-06-13 DIAGNOSIS — E7800 Pure hypercholesterolemia, unspecified: Secondary | ICD-10-CM | POA: Diagnosis not present

## 2020-06-13 DIAGNOSIS — E1140 Type 2 diabetes mellitus with diabetic neuropathy, unspecified: Secondary | ICD-10-CM | POA: Diagnosis not present

## 2020-06-13 DIAGNOSIS — I10 Essential (primary) hypertension: Secondary | ICD-10-CM | POA: Diagnosis not present

## 2020-06-13 DIAGNOSIS — E11319 Type 2 diabetes mellitus with unspecified diabetic retinopathy without macular edema: Secondary | ICD-10-CM | POA: Diagnosis not present

## 2020-06-13 NOTE — Patient Instructions (Signed)
Goals Addressed            This Visit's Progress    COMPLETED: [facility] CM: Learn More About My Health   On track    Follow Up Date 06/09/20    - tell my story and reason for my visit - make a list of questions - ask questions - repeat what I heard to make sure I understand - bring a list of my medicines to the visit - speak up when I don't understand    Why is this important?   The best way to learn about your health and care is by talking to the doctor and nurse.  They will answer your questions and give you information in the way that you like best.    Notes:      COMPLETED: [facility]N CM: Make and Keep All Appointments   On track    Follow Up Date 06/09/20   - ask family or friend for a ride - keep a calendar with appointment dates    Why is this important?   Part of staying healthy is seeing the doctor for follow-up care.  If you forget your appointments, there are some things you can do to stay on track.    Notes:   05/04/20: -- reviewed recent and upcoming provider appointments with patient's spouse/ caregiver; confirmed that patient continues to drive self and that spouse assists as indicated/ needed  06/13/20: -- I am very glad to hear that you are continuing to drive and have not missed any recent doctor's appointments-  keep up the good work!      [facility]N CM: [place] My Quality of Life   On track    Follow Up Date 07/13/20    - complete a living will - discuss my treatment options with the doctor or nurse - make shared treatment decisions with doctor - name a health care proxy (decision maker)    Why is this important?   Having a long-term illness can be scary.  It can also be stressful for you and your caregiver.  These steps may help.    Notes:   06/13/20: -- Start pulmonary rehabilitation as soon as you are able- this is a great program that will help you manage your ability to tolerate activity and any shortness of breath you might have     [facility]N CM: Track  and Manage My Symptoms   On track    Follow Up Date 07/13/20   - develop a rescue plan - eliminate symptom triggers at home - follow rescue plan if symptoms flare-up - keep follow-up appointments    Why is this important?   Tracking your symptoms and other information about your health helps your doctor plan your care.  Write down the symptoms, the time of day, what you were doing and what medicine you are taking.  You will soon learn how to manage your symptoms.     Notes:   05/04/20: -- Smoking cessation strategies discussed with caregiver/ spouse  06/13/20: -- I am glad to hear that you have not had any recent episodes of shortness of breath and have not had to use your rescue inhaler recently: always keep your rescue inhaler prescription up dated and current, in case you need to use it -- review the enclosed information around managing your breathing at home; call your doctor right away if you develop symptoms of a COPD flare: don't wait to call for concerns: call early -- continue your efforts  to decrease/ stop smoking -- continue to limit the salt you take in through your diet -- I have placed a referral for the [facility] to contact you next month to continue providing support to you in your self-care     [facility] CM: Track and Manage My Triggers   On track    Follow Up Date 07/13/20    - eliminate smoking in my home and automobile    Why is this important?   Triggers are activities or things, like tobacco smoke or cold weather, that make your COPD (chronic obstructive pulmonary disease) flare-up.  Knowing these triggers helps you plan how to stay away from them.  When you cannot remove them, you can learn how to manage them.     Notes:   05/04/20: -- smoking cessation encouraged; strategies to decrease smoking discussed with caregiver/ spouse  06/13/20: -- Start pulmonary rehabilitation as soon as you are able- this is a great program that will help you manage  your ability to tolerate activity and any shortness of breath you might have -- review the enclosed information around managing your breathing at home; call your doctor right away if you develop symptoms of a COPD flare: don't wait to call for concerns: call early -- continue your efforts to decrease/ stop smoking -- continue to limit the salt you take in through your diet -- I have placed a referral for the [facility] to contact you next month to continue providing support to you in your self-care        COPD Action Plan A COPD action plan is a description of what to do when you have a flare (exacerbation) of chronic obstructive pulmonary disease (COPD). Your action plan is a color-coded plan that lists the symptoms that indicate whether or not your condition is under control and what actions to take.  If you have symptoms in the green zone, it means you are doing well that day.  If you have symptoms in the yellow zone, it means you are having a bad day or an exacerbation.  If you have symptoms in the red zone, you need urgent medical care. Follow the plan you and your health care provider developed. Review your plan with your health care provider at each visit. Red zone Symptoms in this zone mean that you should get medical help right away. They include:  Feeling very short of breath, even when you are resting.  Not being able to do any activities because of poor breathing.  Not being able to sleep because of poor breathing.  Fever or shaking chills.  Feeling confused or very sleepy.  Chest pain.  Coughing up blood. If you have any of these symptoms, call emergency services (911 in the [place] or go to the nearest emergency room. Yellow zone Symptoms in this zone mean that your condition may be getting worse. They include:  Feeling more short of breath than usual.  Having less energy for daily activities than usual.  Phlegm or mucus that is thicker than  usual.  Needing to use your rescue inhaler or nebulizer more often than usual.  More ankle swelling than usual.  Coughing more than usual.  Feeling like you have a chest cold.  Trouble sleeping due to COPD symptoms.  Decreased appetite.  COPD medicines not helping as much as usual. If you experience any "yellow" symptoms:  Keep taking your daily medicines as directed.  Use your quick-relief inhaler as  told by your health care provider.  If you were prescribed steroid medicine to take by mouth (oral medicine), start taking it as told by your health care provider.  If you were prescribed an antibiotic, start taking it as told by your health care provider. Do not stop taking the antibiotic even if you start to feel better.  Use oxygen as told by your health care provider.  Get more rest.  Do your pursed-lip breathing exercises.  Do not smoke. Avoid any irritants in the air. If your signs and symptoms do not improve after taking these steps, call your health care provider right away. Green zone Symptoms in this zone mean that you are doing well. They include:  Being able to do your usual activities and exercise.  Having the usual amount of coughing, including the same amount of phlegm or mucus.  Being able to sleep well.  Having a good appetite. Follow these instructions at home:  Continue taking your daily medicines as told by your health care provider.  Make sure you receive all the immunizations that your health care provider recommends, especially the pneumococcal and influenza vaccines.  Wash your hands often with soap and water. Have family members wash their hands too. Regular hand washing can help prevent infections.  Follow your usual exercise and diet plan.  Avoid irritants in the air, such as smoke.  Do not use any products that contain nicotine or tobacco, such as cigarettes and e-cigarettes. If you need help quitting, ask your health care  provider. Where to find more information: You can find more information about COPD from:  American Lung Association, My COPD Action Plan: SlotDealers.si.pdf  COPD Foundation: www.copdfoundation.[place]: http://cline.com/ This information is not intended to replace advice given to you by your health care provider. Make sure you discuss any questions you have with your health care provider. Document Revised: 10/14/2017 Document Reviewed: 10/22/2016 Elsevier Patient Education  [place].   Pulmonary Rehabilitation  Pulmonary rehabilitation, also called pulmonary rehab, is a program that helps people manage their breathing problems. The main goals are to increase endurance, reduce breathlessness, and improve quality of life. Pulmonary rehab can last 4-12 weeks or more, depending on your condition. You may need pulmonary rehab if:  You are recovering from lung surgery.  You have ongoing (chronic) lung problems or a condition that makes it hard to breathe, such as: ? A disease that causes scars in lung tissue (interstitial lung disease), including idiopathic pulmonary fibrosis and sarcoidosis. ? Chronic obstructive pulmonary disease (COPD). ? Cystic fibrosis. ? Diseases that affect the muscles used for breathing, such as muscular dystrophy. Benefits of pulmonary rehab Pulmonary rehab may help you:  Increase your ability to exercise.  Reduce breathing problems.  Quit smoking.  Learn how to eat a healthy diet.  Manage a healthy weight.  Learn how to use oxygen therapy.  Manage and understand your medicines and treatment.  Get support from health experts as well as other people with similar problems.  Manage anxiety and depression.  Teach your family about your condition and how to take part in your recovery. Tell a health care provider about:  Any allergies you  have.  All medicines you are taking, including vitamins, herbs, eye drops, creams, and over-the-counter medicines.  Any blood disorders you have.  Any surgeries you have had.  Any medical conditions you have.  Whether you are pregnant or may be pregnant. What are the risks? Generally,  pulmonary rehab is safe. However, problems may occur, including:  Exercise-related injuries.  Higher risk for heart attack or stroke in some cases, due to certain types of exercise (rare). What happens before treatment?  You may have a physical exam in which your health care provider may: ? Do blood tests. ? Test how well you can breathe and how your lungs function. ? Test your ability to exercise, such as how long you can walk on a treadmill.  Your health care providers will work with you to make a treatment plan based on your health and your goals. Your program will be tailored to fit your needs and may change as you make progress. What happens during treatment? Exercise training Exercise training involves doing physical activity, such as:  Stretching routines.  Strength-building exercises with weights. You will work on becoming stronger in both your arms and legs.  Aerobic exercises to improve endurance. You may use a stationary bike or treadmill. Exercise training will vary based on how much activity you can handle, and it will slowly become harder or more intense as you build up your endurance. In most cases, you will have exercise training 3 days a week. After you finish pulmonary rehab, your health care provider will create an exercise program to help you maintain your progress. Education Your rehab team will teach you about your disease and ways you can manage symptoms. You may have one-on-one sessions or group meetings. You may learn:  How to avoid situations that can worsen symptoms.  When and how to take your medicines.  How to prevent lung infections.  How to quit smoking.  How  to use oxygen therapy. Nutrition support Being overweight or underweight can make it harder to breathe. You may meet with nutritionists to come up with the right diet for you. This may include:  A healthy eating plan to help you lose weight.  Adding calorie or protein supplements to help you gain weight or avoid losing weight. Breathing training Breathing exercises can help you better control your breathing by taking deeper breaths, less often. Breathing exercises may include:  Pursed-lip breathing. During this technique, you will inhale through your nose and exhale for 4-6 seconds through pursed lips (such as a kissing or whistling position).  Belly breathing (diaphragmatic breathing). During this technique, you place your hands on your stomach and inhale through your nose. As you breathe in, you should feel your belly rise as air fills your diaphragm. Then, you exhale slowly through pursed lips as you feel your stomach falling.  Energy conservation training Your rehab team will show you how to complete everyday tasks without getting out of breath. This may include techniques for avoiding bending, lifting, or reaching. Counseling You may receive individual or group counseling during pulmonary rehab. These meetings can help with any anxiety, depression, or frustrations you may be feeling. Counseling may include:  Muscle relaxation exercises.  Techniques for dealing with stress or panic. Your family members and caregivers may also participate in counseling. What can I expect after the treatment? Follow these instructions at home:  Exercise regularly as told by your health care providers. Your health care providers will create an exercise plan to help you maintain your endurance and your overall health.  Do not use any products that contain nicotine or tobacco, such as cigarettes and e-cigarettes. If you need help quitting, ask your health care provider.  Take over-the-counter and  prescription medicines only as told by your health care provider.  Keep  all follow-up visits as told by your health care provider. This is important. Contact a health care provider if:  You have questions about your program.  You do not notice any improvement in your breathing or endurance.  You develop new or worse symptoms.  You have a fever.  You have shortness of breath or fatigue when exercising. Get help right away if:  You have shortness of breath when you are sitting still or lying down.  You feel dizzy or faint. Summary  Pulmonary rehabilitation is a program that helps people manage their breathing problems. Your program will be tailored to you.  The main goals of pulmonary rehab are to increase endurance, reduce breathlessness, and improve quality of life.  Once you finish rehab, your health care providers will create an exercise plan to help you maintain your progress.  Do not use any products that contain nicotine or tobacco, such as cigarettes and e-cigarettes. If you need help quitting, ask your health care provider. This information is not intended to replace advice given to you by your health care provider. Make sure you discuss any questions you have with your health care provider. Document Revised: 09/15/2017 Document Reviewed: 09/15/2017 Elsevier Patient Education  [place].

## 2020-06-13 NOTE — Patient Outreach (Signed)
[facility]) Care Management [facility] CM Telephone Outreach- transfer to [place]  06/13/2020  TATEM FESLER 1945/10/23 [id number]  Successful telephone outreach to [name], 74 y/o male referred to this [facility] RN CM 04/24/20 on re-assignment from previous [facility] RN CM.  Original referral received as a result of EMMI Red-Alert notification when patient had 2 hospitalizations in late July forenteritis/sepsis relatedthought to be relatedtochemotherapytreatment for lung cancer.Patient currently not taking chemotherapy; and is on surveillance monitoring; followed regularly by oncology provider.  Patient has history including, but not limited to, COPD; lung cancerwith wedge resection in May 2021; DM; HTN/ HLD; enlarged prostate with urinary retention issues, requiring intermittent use of foley catheter.  HIPAA/ identity verified with patient's spouse, Stanton Kidney, on [facility] DPR; spouse and patient both participate in call today while phone on speaker mode.  Patient denies pain/ new recent falls and denies clinical concerns/ issues/ problems.  Reports just returned home from visit with pharmacist at PCP office; declines need for additional medication review; states several changes were made to improve his neuropathy; gabapentin and metformin both increased; accurately reports last A1-C as "6.1."  Reports changes were made to try and improve neuropathy, as his A1-C is "where they want it, less than 7."  Confirms that he does not monitor blood sugars at home.  Reports has not yet stopped smoking, continues to smoke 1-2 ppd; has eliminated smoking inside which "has helped." Not sure that he can quit completely, he and his wife have joined "[employer] and verbalize plans to continue efforts into new year.  Continues with foley catheter in place; reports attended recent urology provider office visit, was told the that he would need to continue using foley catheter well into  new year until next prostate procedure, not yet scheduled.  Able to independently verbalize signs/ symptoms UTI/ corresponding action plan.  Goes monthly to urology for foley catheter replacement. Patient sounds to be in no distress throughout call today.  -- encouraged ongoing efforts for smoking cessation: patient reports currently smoking 1-2 ppd; reports has joined support group resource "Quit now," this was encouraged -- reviewed recent pulmonology provider office visit: encouraged patient to begin pulmonary rehabilitation once he is contacted to begin by pulmonary rehabilitation team -- confirmed patient had office visit with pharmacist at PCP office- medications were reviewed today and patient declines medication review with me; denies questions/ concerns around medications, reports takes all as prescribed -- confirms has not needed rescue inhaler recently -- signs/ symptoms COPD yellow zone reviewed with patient along with corresponding action plan -- confirmed patient has attended scheduled provider appointments and continues driving self; remains independent in self-care/ ADL's -- weight ranges consistently between 170-172 lbs with daily weight monitoring at home -- continues to follow low salt diet    Patient denies further issues, concerns, or problems today. I confirmed that patient/ caregiver havemy direct phone number, the main [facility] CM office phone number, and the [facility] CM 24-hour nurse advice phone number should issues arise prior to outreach form [place], next month.   Plan:  Patient will take medications as prescribed and will attend all scheduled provider appointments  Patient will promptly notify care providers for any new concerns/ issues/ problems that arise  Patient will continue monitoring/ recording weights and blood pressures at home using insurance provided tablet  Patient/ caregiver will continue their efforts to decrease/ stop smoking   I will  place [place] referral and  make patient's PCP aware of same.  Oneta Rack, RN, BSN, [employer] [facility] Care Management  [phone]

## 2020-06-14 ENCOUNTER — Other Ambulatory Visit: Payer: Self-pay

## 2020-06-14 ENCOUNTER — Encounter: Payer: Medicare Other | Admitting: Cardiothoracic Surgery

## 2020-06-14 ENCOUNTER — Ambulatory Visit (INDEPENDENT_AMBULATORY_CARE_PROVIDER_SITE_OTHER): Payer: Medicare Other | Admitting: Cardiothoracic Surgery

## 2020-06-14 ENCOUNTER — Other Ambulatory Visit: Payer: Self-pay | Admitting: *Deleted

## 2020-06-14 VITALS — BP 123/77 | HR 92 | Temp 97.9°F | Resp 20 | Ht 70.0 in | Wt 175.0 lb

## 2020-06-14 DIAGNOSIS — Z85118 Personal history of other malignant neoplasm of bronchus and lung: Secondary | ICD-10-CM

## 2020-06-14 NOTE — Progress Notes (Signed)
[address]       [place], [address]             [phone]      Chad Gomez [facility] Medical Record #[id number] Date of Birth: [birth year]  Referring: Collene Gobble, MD Primary Care: [name], MD Primary Cardiologist: Minus Breeding, MD   Chief Complaint:   POST OP FOLLOW UP OPERATIVE REPORT DATE OF PROCEDURE:  10/28/2019 PREOPERATIVE DIAGNOSIS:  Non-small-cell lung cancer, left upper lobe lingula. POSTOPERATIVE DIAGNOSIS:  Non-small-cell lung cancer, left upper lobe lingula. SURGICAL PROCEDURES:   1.  Bronchoscopy with navigation bronchoscopy and marking of the left lung lesion. 2.  Robotic-assisted wedge resection of lingula with lymph node biopsy and intercostal nerve blocks.  Cancer Staging Lung cancer, lingula ([place]) Staging form: Lung, AJCC 8th Edition - Pathologic stage from 11/01/2019: Stage IIIA (pT2a, pN2, cM0) - Signed by [name], MD on 11/01/2019  History of Present Illness:     Patient returns today for follow-up visit after recent discharge from the hospital.  He underwent robotic assisted wedge resection of the lingula with lymph node biopsy and intercostal nerve block,     Patient had rough time with cycles of chemo completing 2 of planned 4.  He notes that at this time he feels better than he has in a long time.  He has not smoking, notes that overall his respiratory status seems better.  Both he and his wife have been fully vaccinated for Covid.  In November he had a follow-up CT scan ordered by Dr. Julien Nordmann the findings of this were reviewed with the patient.  Biomarkers/PDL1:  Detected EGFR mutation in exon 21 ? Positive for the EGFR P848L mutation in exon 21. No mutation was detected in exons 12, 18, 19, and 20. The clinical significance of P848L in exon 21 is uncertain. This variant has been reported to behave like functionally silent polymorphism. [name], M.M., et al. Mol Cancer 6, 56 (2007). ? PD-L1  Expression Level: TPS < 1% PD-L1 Expression Status: No Expression NEGATIVE FOR ALK REARRANGEMENT     CURRENT THERAPY: Adjuvant systemic chemotherapy on the [facility] A 621308 protocol with cisplatin 70 mg/M2, pemetrexed 500 mg/M2 every 3 weeks in addition to +/- pembrolizumab 400 mg IV every 6 weeks.  Patient ended up being admitted with each cycle of chemo through the emergency room, ultimately he completed 2 cycles and then declined any further chemotherapy.  He notes that he is slowly gaining strength.  Uses a cane for stability but says he does not really need it.    Past Medical History:  Diagnosis Date  . BPH (benign prostatic hypertrophy)    weak stream  . Chronic kidney disease   . Complication of anesthesia    urinary retention  . COPD (chronic obstructive pulmonary disease) ([place])   . Diabetes mellitus without complication ([place])    Type II  . History of bladder stone   . History of colon polyps    08/2013   pre-canerous  . History of kidney stones   . History of melanoma excision    LEFT EAR  . Hyperlipidemia   . Hypertension   . Iron deficiency   . Lung cancer, lingula ([place]) 11/01/2019  . Open wound    left lower jaw; followed by ENT  . Peripheral vascular disease ([place])   . Right ureteral stone   . Shingles 05-15-2015     Social History  Tobacco Use  Smoking Status Current Every Day Smoker  . Packs/day: 2.00  . Years: 60.00  . Pack years: 120.00  . Types: Cigarettes  . Start date: 06/24/1959  Smokeless Tobacco Never Used  Tobacco Comment   05/28/20- 1.5 a day     Social History   Substance and Sexual Activity  Alcohol Use No  . Alcohol/week: 0.0 standard drinks     Allergies  Allergen Reactions  . Morphine And Related Anxiety and Other (See Comments)    Combative, incoherent    . Tape Other (See Comments) and Rash    Surgical tape Adhesive tape causes blisters  . Amoxicillin Other (See Comments)    Severe stomach pain  . Naproxen Swelling     Eyes swell    Current Outpatient Medications  Medication Sig Dispense Refill  . acetaminophen (TYLENOL) 500 MG tablet Take 2 tablets (1,000 mg total) by mouth every 6 (six) hours as needed for mild pain. 30 tablet 0  . albuterol (VENTOLIN HFA) 108 (90 Base) MCG/ACT inhaler Inhale 2 puffs into the lungs every 6 (six) hours as needed for wheezing or shortness of breath. 8 g 3  . aspirin EC 81 MG tablet Take 81 mg by mouth daily.    Marland Kitchen atorvastatin (LIPITOR) 40 MG tablet Take 40 mg by mouth daily.    . Cholecalciferol (VITAMIN D-3) 1000 units CAPS Take 1,000 Units by mouth daily.     Marland Kitchen ezetimibe (ZETIA) 10 MG tablet Take 5 mg by mouth daily.    . finasteride (PROSCAR) 5 MG tablet Take 5 mg by mouth daily.     . fluticasone (FLONASE) 50 MCG/ACT nasal spray Place 2 sprays into both nostrils daily.     . folic acid (FOLVITE) 1 MG tablet Take 1 mg by mouth daily.    Marland Kitchen gabapentin (NEURONTIN) 100 MG capsule Take 200 mg by mouth 3 (three) times daily.    . K Phos Mono-Sod Phos Di & Mono (K-PHOS-NEUTRAL) [phone] MG TABS Take 2 tablets by mouth in the morning and at bedtime. 120 tablet 0  . magnesium oxide (MAG-OX) 400 (241.3 Mg) MG tablet Take 1 tablet (400 mg total) by mouth 2 (two) times daily. 60 tablet 0  . metFORMIN (GLUCOPHAGE-XR) 500 MG 24 hr tablet Take 500-1,000 mg by mouth See admin instructions. Take 500 mg every morning and take 1000 mg at supper    . Nutritional Supplements (FEEDING SUPPLEMENT, KATE FARMS STANDARD 1.4,) LIQD liquid Take 325 mLs by mouth 2 (two) times daily between meals. 325 mL 0  . potassium citrate (UROCIT-K) 10 MEQ (1080 MG) SR tablet Take 10 mEq by mouth in the morning and at bedtime.     . Tiotropium Bromide-Olodaterol (STIOLTO RESPIMAT) 2.5-2.5 MCG/ACT AERS Inhale 2 puffs into the lungs daily. 3 each 3  . traMADol (ULTRAM) 50 MG tablet Take 1 tablet (50 mg total) by mouth every 6 (six) hours as needed (mild pain). 20 tablet 0   No current facility-administered  medications for this visit.       Physical Exam: BP 123/77   Pulse 92   Temp 97.9 F (36.6 C) (Skin)   Resp 20   Ht 5' 10"  (1.778 m)   Wt 175 lb (79.4 kg)   SpO2 95% Comment: RA  BMI 25.11 kg/m   General appearance: alert and cooperative Head: Normocephalic, without obvious abnormality, atraumatic Neck: no adenopathy, no carotid bruit, no JVD, supple, symmetrical, trachea midline and thyroid not enlarged, symmetric, no tenderness/mass/nodules  Lymph nodes: Cervical, supraclavicular, and axillary nodes normal. Resp: clear to auscultation bilaterally Cardio: regular rate and rhythm, S1, S2 normal, no murmur, click, rub or gallop Extremities: extremities normal, atraumatic, no cyanosis or edema Neurologic: Grossly normal  Diagnostic Studies & Laboratory data:     Recent Radiology Findings:   Follow up ct chest has been arranged by oncology  CLINICAL DATA:  Restaging non-small cell lung cancer. Status post left upper lobe wedge resection. Undergoing chemotherapy.  EXAM: CT CHEST WITH CONTRAST  TECHNIQUE: Multidetector CT imaging of the chest was performed during intravenous contrast administration.  CONTRAST:  8m OMNIPAQUE IOHEXOL 300 MG/ML  SOLN  COMPARISON:  09/01/2019  FINDINGS: Cardiovascular: The heart is normal in size. No pericardial effusion. Stable tortuosity, mild ectasia and advanced atherosclerotic calcifications involving the thoracic aorta. No dissection. The branch vessels are patent. Moderate calcifications. Calcifications also noted along round the aortic valve.  Mediastinum/Nodes: Small scattered mediastinal and hilar lymph nodes but no mass or overt adenopathy. The esophagus is grossly normal. Small hiatal hernia noted.  Lungs/Pleura: Surgical changes from a left upper lobe wedge resection. Soft tissue density (24 x 18 mm) surrounding the surgical staple line is not an unexpected finding on this baseline postoperative study. Attention  on future scans is suggested.  Stable underlying emphysematous changes with areas of pulmonary scarring. No new pulmonary lesions or worrisome pulmonary nodules to suggest metastatic disease. No pleural effusions or pulmonary edema. No pneumothorax.  Upper Abdomen: No significant upper abdominal findings. Stable advanced aortic calcifications, cholelithiasis, small hepatic cysts, left renal cysts and atrophied right kidney. No adrenal gland lesions.  Musculoskeletal: No chest wall mass, supraclavicular or axillary adenopathy.  The bony thorax is intact.  IMPRESSION: 1. Surgical changes from a left upper lobe wedge resection. Soft tissue density surrounding the surgical staple line is not an unexpected finding on this baseline postoperative study. Attention on future scans is suggested. 2. Stable emphysematous changes and pulmonary scarring. No new pulmonary lesions or worrisome pulmonary nodules to suggest metastatic disease. 3. Stable advanced atherosclerotic calcifications involving the thoracic and abdominal aorta and branch vessels. 4. Cholelithiasis. 5. Emphysema and aortic atherosclerosis.  Aortic Atherosclerosis (ICD10-I70.0) and Emphysema (ICD10-J43.9).  Aortic Atherosclerosis (ICD10-I70.0) and Emphysema (ICD10-J43.9).   Electronically Signed   By: PMarijo SanesM.D.   On: 04/30/2020 08:44     Recent Lab Findings: Lab Results  Component Value Date   WBC 7.1 04/27/2020   HGB 13.1 04/27/2020   HCT 39.5 04/27/2020   PLT 181 04/27/2020   GLUCOSE 108 (H) 04/27/2020   TRIG 137 01/02/2020   ALT 15 04/27/2020   AST 16 04/27/2020   NA 139 04/27/2020   K 4.1 04/27/2020   CL 102 04/27/2020   CREATININE 0.96 04/27/2020   BUN 15 04/27/2020   CO2 31 04/27/2020   TSH 2.649 01/30/2020   INR 1.1 12/26/2019   HGBA1C 6.1 (H) 12/26/2019      Assessment / Plan:   #1 non-small cell carcinoma of the lung lingula status post lingulectomy lymph node biopsy  Cancer Staging Lung cancer, lingula ([facility]) Staging form: Lung, AJCC 8th Edition - Pathologic stage from 11/01/2019: Stage IIIA (pT2a, pN2, cM0) - Signed by GGrace Isaac MD on 11/01/2019  Currently under observation through the oncology clinic-patient is getting CT scans every 3 months I am not made a return appointment to see him in the surgical office but could arrange follow-up at any time it his or Dr. MWorthy Flankrequest    Medication  Changes: No orders of the defined types were placed in this encounter.     [name] MD      [address] [place] [address] Office [phone]     06/14/2020 3:33 PM

## 2020-06-18 ENCOUNTER — Telehealth: Payer: Self-pay | Admitting: Pulmonary Disease

## 2020-06-18 MED ORDER — STIOLTO RESPIMAT 2.5-2.5 MCG/ACT IN AERS: 2.0000 | INHALATION_SPRAY | Freq: Every day | RESPIRATORY_TRACT | 3 refills | Status: DC

## 2020-06-18 NOTE — Telephone Encounter (Signed)
lmtcb for pt.  

## 2020-06-18 NOTE — Telephone Encounter (Signed)
Patient needs Stiolto Rx faxed to [employer] [facility].  This has been sent as requested.  Nothing further needed at this time- will close encounter.

## 2020-06-23 NOTE — Progress Notes (Deleted)
Cardiology Office Note   Date:  06/23/2020   ID:  Chad Gomez [birth year], MRN [id number]  PCP:  Chad Pretty, MD  Cardiologist:   Chad Breeding, MD   No chief complaint on file.     History of Present Illness: Chad Gomez is a 75 y.o. male with a hx of COPD and lung cancer.  He has had vascular disease in the past.  In April 2021 he hadpreop clearance prior to lobectomy.  Myoview study was low risk and the patient underwent lobectomy in May 2021.  He had a rough time with chemo and has had cisplatin, pemetrexed and pembrolizumab. He only tolerated 2 of 4 cycles and had to be admitted.  He has declined further chemo.  ***   ***who is sent by Chad Gomez for preop clearance prior to resection of a small cell lung cancer.  He is seen by Chad Gomez for evaluation of severe COPD.    I saw him over three years ago for evaluation of an abnormal EKG.  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  The patient had a perfusion study that was negative for ischemia.   He has had no recent cardiovascular symptoms.  He is limited by hip pain and can only walk about half a block.  He is relatively sedentary.  He does not have any chest pressure, neck or arm discomfort.  He does not describe excessive shortness of breath, PND or orthopnea.  He does not have palpitations, presyncope or syncope.  His biggest recent problem has been bladder outlet obstruction and he has an indwelling Foley catheter.  Past Medical History:  Diagnosis Date  . BPH (benign prostatic hypertrophy)    weak stream  . Chronic kidney disease   . Complication of anesthesia    urinary retention  . COPD (chronic obstructive pulmonary disease) ([place])   . Diabetes mellitus without complication ([place])    Type II  . History of bladder stone   . History of colon polyps    08/2013   pre-canerous  . History of kidney stones   . History of melanoma excision    LEFT EAR  .  Hyperlipidemia   . Hypertension   . Iron deficiency   . Lung cancer, lingula ([place]) 11/01/2019  . Open wound    left lower jaw; followed by ENT  . Peripheral vascular disease ([place])   . Right ureteral stone   . Shingles 05-15-2015    Past Surgical History:  Procedure Laterality Date  . ABDOMINAL AORTAGRAM  11/01/2014   Procedure: Abdominal Aortagram;  Surgeon: Serafina Mitchell, MD;  Location: [facility];  Service: Cardiovascular;;  . AORTA - BILATERAL FEMORAL ARTERY BYPASS GRAFT N/A 12/28/2014   Procedure: AORTOBIFEMORAL BYPASS GRAFT;  Surgeon: Serafina Mitchell, MD;  Location: [facility];  Service: Vascular;  Laterality: N/A;  . BRONCHIAL BRUSHINGS  09/06/2019   Procedure: BRONCHIAL BRUSHINGS;  Surgeon: Collene Gobble, MD;  Location: [facility] END[facility]COPY;  Service: Pulmonary;;  left upper lobe nodule  . BRONCHIAL WASHINGS  09/06/2019   Procedure: BRONCHIAL WASHINGS;  Surgeon: Collene Gobble, MD;  Location: [facility] ENDOSCOPY;  Service: Pulmonary;;  . COLONOSCOPY W/ POLYPECTOMY  09-13-2013  . CYSTOLITHALOPAXY OF BLADDER STONE/ TRANSRECTAL ULTRASOUND PROSTATE BX  08-19-2005  . CYSTOSCOPY W/ RETROGRADES Right 12/02/2012   Procedure: CYSTOSCOPY WITH RETROGRADE PYELOGRAM;  Surgeon: Malka So, MD;  Location: [facility];  Service: Urology;  Laterality: Right;  . CYSTOSCOPY W/ RETROGRADES Bilateral 10/13/2013   Procedure: CYSTOSCOPY WITH RETROGRADE PYELOGRAM;  Surgeon: Irine Seal, MD;  Location: [facility];  Service: Urology;  Laterality: Bilateral;  . CYSTOSCOPY W/ URETERAL STENT PLACEMENT Left 04/11/2014   Procedure: CYSTOSCOPY WITH STENT REPLACEMENT;  Surgeon: Malka So, MD;  Location: [facility];  Service: Urology;  Laterality: Left;  . CYSTOSCOPY WITH LITHOLAPAXY Right 12/02/2012   Procedure: CYSTOSCOPY WITH stone extraction;  Surgeon: Malka So, MD;  Location: [facility];  Service: Urology;  Laterality: Right;  . CYSTOSCOPY WITH  LITHOLAPAXY Right 10/13/2013   Procedure: CYSTOSCOPY WITH LITHOLAPAXY, removal of urethral stone with basket;  Surgeon: Irine Seal, MD;  Location: [facility];  Service: Urology;  Laterality: Right;  . CYSTOSCOPY WITH RETROGRADE PYELOGRAM, URETEROSCOPY AND STENT PLACEMENT Left 03/21/2014   Procedure: CYSTOSCOPY WITH RETROGRADE PYELOGRAM, AND LEFT STENT PLACEMENT;  Surgeon: Alexis Frock, MD;  Location: [facility];  Service: Urology;  Laterality: Left;  . CYSTOSCOPY WITH RETROGRADE PYELOGRAM, URETEROSCOPY AND STENT PLACEMENT  09/09/2016   Procedure: CYSTOSCOPY WITH RIGHT  RETROGRADE PYELOGRAM, URETEROSCOPY WITH LASER BASKET EXTRACTION OF STONES  AND STENT PLACEMENT;  Surgeon: Irine Seal, MD;  Location: [facility];  Service: Urology;;  . Consuela Mimes WITH URETEROSCOPY Left 04/11/2014   Procedure: CYSTOSCOPY WITH URETEROSCOPY/ STONE EXTRACTION;  Surgeon: Malka So, MD;  Location: [facility];  Service: Urology;  Laterality: Left;  . FINE NEEDLE ASPIRATION  09/06/2019   Procedure: FINE NEEDLE ASPIRATION;  Surgeon: Collene Gobble, MD;  Location: [facility] ENDOSCOPY;  Service: Pulmonary;;  left upper lobe - lung  . HOLMIUM LASER APPLICATION Right 03/06/7828   Procedure: HOLMIUM LASER APPLICATION;  Surgeon: Malka So, MD;  Location: [facility];  Service: Urology;  Laterality: Right;  . HOLMIUM LASER APPLICATION Left 56/21/3086   Procedure: HOLMIUM LASER APPLICATION;  Surgeon: Malka So, MD;  Location: [facility];  Service: Urology;  Laterality: Left;  . HOLMIUM LASER APPLICATION Right 5/78/4696   Procedure: HOLMIUM LASER APPLICATION;  Surgeon: Irine Seal, MD;  Location: [facility];  Service: Urology;  Laterality: Right;  . INGUINAL HERNIA REPAIR Right 1954  . INTERCOSTAL NERVE BLOCK Left 10/28/2019   Procedure: Intercostal Nerve Block;  Surgeon: [name], MD;  Location: [facility];  Service: Thoracic;  Laterality:  Left;  . LITHOTRIPSY  2007 & 02/2013  . LUNG BIOPSY  09/06/2019   Procedure: LUNG BIOPSY;  Surgeon: Collene Gobble, MD;  Location: [facility] ENDOSCOPY;  Service: Pulmonary;;  left upper lob lung  . [place]  . MICROLARYNGOSCOPY Right 02/18/2016   Procedure: MICRO DIRECT LARYNGOSCOPY EXCISIONAL BIOPSY OF RIGHT VOCAL CORD MASS;  Surgeon: Leta Baptist, MD;  Location: [facility];  Service: ENT;  Laterality: Right;  . [place]   left ear  . MULTIPLE TOOTH EXTRACTIONS  1994  . NEPHROLITHOTOMY Left 01/27/2013   Procedure: NEPHROLITHOTOMY PERCUTANEOUS FIRST LOOK;  Surgeon: Malka So, MD;  [facility];  Service: Urology;  Laterality: Left;  . NODE DISSECTION  10/28/2019   Procedure: Node Dissection;  Surgeon: [name], MD;  Location: [facility];  Service: Thoracic;;  . PERIPHERAL VASCULAR CATHETERIZATION N/A 11/01/2014   Procedure: Lower Extremity Angiography;  Surgeon: Serafina Mitchell, MD;  Location: [place] [facility];  Service: Cardiovascular;  Laterality: N/A;  . URETEROSCOPY Right 12/02/2012  Procedure: RIGHT URETEROSCOPY STONE EXTRACTION WITH  STENT PLACEMENT;  Surgeon: Malka So, MD;  Location: [facility];  Service: Urology;  Laterality: Right;  Marland Kitchen VIDEO BRONCHOSCOPY  10/28/2019   VIDEO BRONCHOSCOPY WITH ENDOBRONCHIAL NAVIGATION WITH LUNG MARKINGS (N/A  . VIDEO BRONCHOSCOPY WITH ENDOBRONCHIAL NAVIGATION N/A 09/06/2019   Procedure: VIDEO BRONCHOSCOPY WITH ENDOBRONCHIAL NAVIGATION;  Surgeon: Collene Gobble, MD;  Location: [place] [facility];  Service: Pulmonary;  Laterality: N/A;  . VIDEO BRONCHOSCOPY WITH ENDOBRONCHIAL NAVIGATION N/A 10/28/2019   Procedure: VIDEO BRONCHOSCOPY WITH ENDOBRONCHIAL NAVIGATION WITH LUNG MARKINGS;  Surgeon: [name], MD;  Location: [facility];  Service: Thoracic;  Laterality: N/A;  . WEDGE RESECTION  10/28/2019   LEFT UPPER LOBE     Current Outpatient Medications  Medication Sig Dispense Refill  .  acetaminophen (TYLENOL) 500 MG tablet Take 2 tablets (1,000 mg total) by mouth every 6 (six) hours as needed for mild pain. 30 tablet 0  . albuterol (VENTOLIN HFA) 108 (90 Base) MCG/ACT inhaler Inhale 2 puffs into the lungs every 6 (six) hours as needed for wheezing or shortness of breath. 8 g 3  . aspirin EC 81 MG tablet Take 81 mg by mouth daily.    Marland Kitchen atorvastatin (LIPITOR) 40 MG tablet Take 40 mg by mouth daily.    . Cholecalciferol (VITAMIN D-3) 1000 units CAPS Take 1,000 Units by mouth daily.     Marland Kitchen ezetimibe (ZETIA) 10 MG tablet Take 5 mg by mouth daily.    . finasteride (PROSCAR) 5 MG tablet Take 5 mg by mouth daily.     . fluticasone (FLONASE) 50 MCG/ACT nasal spray Place 2 sprays into both nostrils daily.     . folic acid (FOLVITE) 1 MG tablet Take 1 mg by mouth daily.    Marland Kitchen gabapentin (NEURONTIN) 100 MG capsule Take 200 mg by mouth 3 (three) times daily.    . K Phos Mono-Sod Phos Di & Mono (K-PHOS-NEUTRAL) [phone] MG TABS Take 2 tablets by mouth in the morning and at bedtime. 120 tablet 0  . magnesium oxide (MAG-OX) 400 (241.3 Mg) MG tablet Take 1 tablet (400 mg total) by mouth 2 (two) times daily. 60 tablet 0  . metFORMIN (GLUCOPHAGE-XR) 500 MG 24 hr tablet Take 500-1,000 mg by mouth See admin instructions. Take 500 mg every morning and take 1000 mg at supper    . Nutritional Supplements (FEEDING SUPPLEMENT, KATE FARMS STANDARD 1.4,) LIQD liquid Take 325 mLs by mouth 2 (two) times daily between meals. 325 mL 0  . potassium citrate (UROCIT-K) 10 MEQ (1080 MG) SR tablet Take 10 mEq by mouth in the morning and at bedtime.     . Tiotropium Bromide-Olodaterol (STIOLTO RESPIMAT) 2.5-2.5 MCG/ACT AERS Inhale 2 puffs into the lungs daily. 12 g 3  . traMADol (ULTRAM) 50 MG tablet Take 1 tablet (50 mg total) by mouth every 6 (six) hours as needed (mild pain). 20 tablet 0   No current facility-administered medications for this visit.    Allergies:   Morphine and related, Tape, Amoxicillin, and  Naproxen    ROS:  Please see the history of present illness.   Otherwise, review of systems are positive for ***.   All other systems are reviewed and negative.    PHYSICAL EXAM: VS:  There were no vitals taken for this visit. , BMI There is no height or weight on file to calculate BMI. GENERAL:  Well appearing NECK:  No jugular venous distention, waveform within normal limits, carotid  upstroke brisk and symmetric, no bruits, no thyromegaly LUNGS:  Clear to auscultation bilaterally CHEST:  Unremarkable HEART:  PMI not displaced or sustained,S1 and S2 within normal limits, no S3, no S4, no clicks, no rubs, *** murmurs ABD:  Flat, positive bowel sounds normal in frequency in pitch, no bruits, no rebound, no guarding, no midline pulsatile mass, no hepatomegaly, no splenomegaly EXT:  2 plus pulses throughout, no edema, no cyanosis no clubbing    ***GENERAL:  Well appearing HEENT:  Pupils equal round and reactive, fundi not visualized, oral mucosa unremarkable NECK:  No jugular venous distention, waveform within normal limits, carotid upstroke brisk and symmetric, no bruits, no thyromegaly LYMPHATICS:  No cervical, inguinal adenopathy LUNGS: Decreased bilateral breath sounds BACK:  No CVA tenderness CHEST:  Unremarkable HEART:  PMI not displaced or sustained,S1 and S2 within normal limits, no S3, no S4, no clicks, no rubs, no murmurs ABD:  Flat, positive bowel sounds normal in frequency in pitch, no bruits, no rebound, no guarding, no midline pulsatile mass, no hepatomegaly, no splenomegaly, well-healed surgical scar EXT:  2 plus pulses palpable pulses, decreased dorsalis pedis and posterior heels bilaterally, no edema, no cyanosis no clubbing SKIN:  No rashes no nodules NEURO:  Cranial nerves II through XII grossly intact, motor grossly intact throughout PSYCH:  Cognitively intact, oriented to person place and time    EKG:  EKG is *** ordered today. The ekg ordered today demonstrates  sinus rhythm, rate ***, axis within normal limits, intervals within normal limits, RSR prime V1 V2, low voltage chest leads.   Recent Labs: 01/30/2020: TSH 2.649 04/27/2020: ALT 15; BUN 15; Creatinine 0.96; Hemoglobin 13.1; Magnesium 1.5; Platelet Count 181; Potassium 4.1; Sodium 139    Lipid Panel    Component Value Date/Time   TRIG 137 01/02/2020 0459      Wt Readings from Last 3 Encounters:  06/14/20 175 lb (79.4 kg)  05/28/20 178 lb 3.2 oz (80.8 kg)  04/30/20 175 lb 14.4 oz (79.8 kg)      Other studies Reviewed: Additional studies/ records that were reviewed today include:  TCTS notes *** Review of the above records demonstrates:  Please see elsewhere in the note.     ASSESSMENT AND PLAN:  PVD:  ***   The patient has a low functional status.  He is going for a moderate to high risk surgical procedure.  He has a known peripheral vascular disease and coronary calcification noted.  Given all of this further preoperative testing is indicated.  He would be able to walk on treadmill.  Therefore, he will need a Lexiscan Myoview.  HTN: Blood pressure is *** controlled.  No change in therapy.  DYSLIPIDEMIA: LDL recently was *** 44 with an HDL of 42.  No change in therapy.  TOBACCO ABUSE:   *** He has tried everything to stop smoking including Chantix and hypnosis.  He is hopelessly addicted.  DM: A1c is *** 6.2.  No change in therapy.   Current medicines are reviewed at length with the patient today.  The patient does not have concerns regarding medicines.  The following changes have been made:  ***  Labs/ tests ordered today include: ***  No orders of the defined types were placed in this encounter.    Disposition:   FU with me ***     Signed, Chad Breeding, MD  06/23/2020 10:34 AM    [facility]

## 2020-06-25 ENCOUNTER — Ambulatory Visit: Payer: Medicare Other | Admitting: Cardiology

## 2020-06-25 DIAGNOSIS — E785 Hyperlipidemia, unspecified: Secondary | ICD-10-CM

## 2020-06-25 DIAGNOSIS — I10 Essential (primary) hypertension: Secondary | ICD-10-CM

## 2020-06-25 DIAGNOSIS — E118 Type 2 diabetes mellitus with unspecified complications: Secondary | ICD-10-CM

## 2020-06-25 DIAGNOSIS — I739 Peripheral vascular disease, unspecified: Secondary | ICD-10-CM

## 2020-07-02 ENCOUNTER — Telehealth: Payer: Self-pay | Admitting: Pulmonary Disease

## 2020-07-02 ENCOUNTER — Telehealth (HOSPITAL_COMMUNITY): Payer: Self-pay

## 2020-07-02 NOTE — Telephone Encounter (Signed)
Pt insurance is active and benefits verified through [facility] Medicare Co-pay 0, DED 0/0 met, out of pocket $4,500/0 met, co-insurance 0%. no pre-authorization required. Passport, 07/02/2020@11 :18am, REF# [id number]

## 2020-07-02 NOTE — Telephone Encounter (Signed)
Received a fax from [facility] stating that the patient has been approved for patient assistance for Stiolto. Patient has been approved 06/30/20 until 06/22/21.   Will send fax to be scanned into patient's chart.

## 2020-07-06 DIAGNOSIS — I10 Essential (primary) hypertension: Secondary | ICD-10-CM | POA: Diagnosis not present

## 2020-07-06 DIAGNOSIS — M25561 Pain in right knee: Secondary | ICD-10-CM | POA: Diagnosis not present

## 2020-07-11 ENCOUNTER — Telehealth (HOSPITAL_COMMUNITY): Payer: Self-pay | Admitting: *Deleted

## 2020-07-12 ENCOUNTER — Other Ambulatory Visit: Payer: Self-pay | Admitting: *Deleted

## 2020-07-12 ENCOUNTER — Encounter: Payer: Self-pay | Admitting: *Deleted

## 2020-07-12 NOTE — Patient Outreach (Signed)
[facility]) Care Management  [place]  07/12/2020   Chad Gomez 07-18-1945 [id number]  Subjective: Successful telephone outreach call to patient. HIPAA identifiers obtained. Patient reports that he is doing very well. Patient explained that his COPD is currently under control and called his control inhaler Stiolto a wonder drug. Patient denies any SOB and reports that it has been months since he has had to use his rescue inhaler. Patient states he fell a couple of weeks ago but denies any concerning injury. He reports that his home environment is safe, emotionally he is in a good place, and he is well supported by his wife and family. Patient did not have any further questions or concerns today.     Encounter Medications:  Outpatient Encounter Medications as of 07/12/2020  Medication Sig Note  . acetaminophen (TYLENOL) 500 MG tablet Take 2 tablets (1,000 mg total) by mouth every 6 (six) hours as needed for mild pain.   Marland Kitchen albuterol (VENTOLIN HFA) 108 (90 Base) MCG/ACT inhaler Inhale 2 puffs into the lungs every 6 (six) hours as needed for wheezing or shortness of breath.   Marland Kitchen aspirin EC 81 MG tablet Take 81 mg by mouth daily.   Marland Kitchen atorvastatin (LIPITOR) 40 MG tablet Take 40 mg by mouth daily.   . Cholecalciferol (VITAMIN D-3) 1000 units CAPS Take 1,000 Units by mouth daily.    Marland Kitchen ezetimibe (ZETIA) 10 MG tablet Take 5 mg by mouth daily. 02/21/2020: Wife states taking it as needed.   . finasteride (PROSCAR) 5 MG tablet Take 5 mg by mouth daily.  07/12/2020: Patient states that he takes this instead of Flomax  . fluticasone (FLONASE) 50 MCG/ACT nasal spray Place 2 sprays into both nostrils daily.    Marland Kitchen gabapentin (NEURONTIN) 100 MG capsule Take 200 mg by mouth 3 (three) times daily.   . K Phos Mono-Sod Phos Di & Mono (K-PHOS-NEUTRAL) [phone] MG TABS Take 2 tablets by mouth in the morning and at bedtime.   . metFORMIN (GLUCOPHAGE-XR) 500 MG 24 hr tablet Take 500-1,000  mg by mouth See admin instructions. Take 500 mg every morning and take 1000 mg at supper   . Nutritional Supplements (FEEDING SUPPLEMENT, KATE FARMS STANDARD 1.4,) LIQD liquid Take 325 mLs by mouth 2 (two) times daily between meals.   . potassium citrate (UROCIT-K) 10 MEQ (1080 MG) SR tablet Take 10 mEq by mouth in the morning and at bedtime.    . Tiotropium Bromide-Olodaterol (STIOLTO RESPIMAT) 2.5-2.5 MCG/ACT AERS Inhale 2 puffs into the lungs daily.   . traMADol (ULTRAM) 50 MG tablet Take 1 tablet (50 mg total) by mouth every 6 (six) hours as needed (mild pain). 07/12/2020: Have not needed this  . folic acid (FOLVITE) 1 MG tablet Take 1 mg by mouth daily. (Patient not taking: Reported on 07/12/2020) 07/12/2020: Takes One A Day vitamin that has folic acid  . magnesium oxide (MAG-OX) 400 (241.3 Mg) MG tablet Take 1 tablet (400 mg total) by mouth 2 (two) times daily. (Patient not taking: Reported on 07/12/2020) 07/12/2020: Takes OTC magnesium supplement   No facility-administered encounter medications on file as of 07/12/2020.    Functional Status:  In your present state of health, do you have any difficulty performing the following activities: 01/16/2020 12/27/2019  Hearing? N N  Vision? N N  Difficulty concentrating or making decisions? N N  Walking or climbing stairs? Y N  Comment secondary to weakness -  Dressing or bathing? Aggie Moats  Doing errands, shopping? N Y  Comment - "wants wife to be with me when running errands"  Some recent data might be hidden    Fall/Depression Screening: Fall Risk  07/12/2020 06/13/2020  Falls in the past year? 1 0  Number falls in past yr: 0 0  Injury with Fall? 0 0  Comment - N/A- no falls reported  Risk for fall due to : History of fall(s) -  Follow up Falls prevention discussed;Education provided;Falls evaluation completed Falls prevention discussed   PHQ 2/9 Scores 07/12/2020 06/13/2020 05/04/2020 02/21/2020 11/16/2019  PHQ - 2 Score 0 1 0 0 -  Exception  Documentation - - - - Other- indicate reason in comment box  Not completed - - - - Spoke with wife per patient and (patient's wife / designated party release) request.    Assessment:  Goals Addressed            This Visit's Progress   . COMPLETED: THN CM: Make and Keep All Appointments                   Follow Up Date 06/09/20   - ask family or friend for a ride - keep a calendar with appointment dates    Why is this important?   Part of staying healthy is seeing the doctor for follow-up care.  If you forget your appointments, there are some things you can do to stay on track.    Notes:   05/04/20: -- reviewed recent and upcoming provider appointments with patient's spouse/ caregiver; confirmed that patient continues to drive self and that spouse assists as indicated/ needed  06/13/20: -- I am very glad to hear that you are continuing to drive and have not missed any recent doctor's appointments-  keep up the good work!     Margie Billet CM: [facility] My Quality of Life       Timeframe:  Long-Range Goal Priority:  Medium Start Date:  05/04/20                           Expected End Date:  10/20/20                    Follow Up Date 10/20/20    - complete a living will    Why is this important?   Having a long-term illness can be scary.  It can also be stressful for you and your caregiver.  These steps may help.    Notes:   06/13/20: -- Start pulmonary rehabilitation as soon as you are able- this is a great program that will help you manage your ability to tolerate activity and any shortness of breath you might have  Updated 07/12/20 Patient requested that this nurse send him advance directive documents.    Margie Billet CM: Track and Manage My Symptoms       Timeframe:  Long-Range Goal Priority:  High Start Date: 05/04/20                            Expected End Date:  12/20/20                     Follow Up Date 10/20/20   - begin a symptom diary - develop a rescue plan -  eliminate symptom triggers at home - follow rescue plan if symptoms flare-up - keep follow-up appointments  Why is this important?   Tracking your symptoms and other information about your health helps your doctor plan your care.  Write down the symptoms, the time of day, what you were doing and what medicine you are taking.  You will soon learn how to manage your symptoms.     Notes:   05/04/20: -- Smoking cessation strategies discussed with caregiver/ spouse  06/13/20: -- I am glad to hear that you have not had any recent episodes of shortness of breath and have not had to use your rescue inhaler recently: always keep your rescue inhaler prescription up dated and current, in case you need to use it -- review the enclosed information around managing your breathing at home; call your doctor right away if you develop symptoms of a COPD flare: don't wait to call for concerns: call early -- continue your efforts to decrease/ stop smoking -- continue to limit the salt you take in through your diet -- I have placed a referral for the [facility] to contact you next month to continue providing support to you in your self-care  Updated: 07/12/20 Patient reports that his COPD is well controlled presently. Patient records all of his taken medications and tracks how he feels daily. He monitors his symptoms closely and contacts his providers as indicated per his COPD action plan.     . COMPLETED: THN CM: Track and Manage My Triggers       Timeframe:  Long-Range Goal Priority:  Medium Start Date:  05/04/20                           Expected End Date:  07/12/20                     Follow Up Date 07/12/20   - eliminate smoking in my home and automobile    Why is this important?   Triggers are activities or things, like tobacco smoke or cold weather, that make your COPD (chronic obstructive pulmonary disease) flare-up.  Knowing these triggers helps you plan how to stay away from them.   When you cannot remove them, you can learn how to manage them.     Notes:   05/04/20: -- smoking cessation encouraged; strategies to decrease smoking discussed with caregiver/ spouse  06/13/20: -- Start pulmonary rehabilitation as soon as you are able- this is a great program that will help you manage your ability to tolerate activity and any shortness of breath you might have -- review the enclosed information around managing your breathing at home; call your doctor right away if you develop symptoms of a COPD flare: don't wait to call for concerns: call early -- continue your efforts to decrease/ stop smoking -- continue to limit the salt you take in through your diet -- I have placed a referral for the [facility] to contact you next month to continue providing support to you in your self-care  Updated: 07/12/20 Patient reports that he is breathing very well. The Stiolto inhaler is working very well and patient states he very seldom needs to use his rescue inhaler. Adding that it has been months since he last used his rescue inhaler. Patient states that he is not ready to quit smoking at this time. He does explain that he has cut back on the amount of cigarettes he smokes daily. Patient reports that his home environment is safe and denies  that he is bothered by outside triggers.         Plan: RN Health Coach will send PCP a barrier letter and today's assessment note, will send patient advance directives, sleep tip education, and an exercise booklet, will call patient within the month of April. Follow-up:  Patient agrees to Care Plan and Follow-up.  [name] RN, BSN [place] [phone] [email or web address]

## 2020-07-12 NOTE — Patient Instructions (Addendum)
Goals Addressed            This Visit's Progress   . COMPLETED: THN CM: Make and Keep All Appointments                   Follow Up Date 06/09/20   - ask family or friend for a ride - keep a calendar with appointment dates    Why is this important?   Part of staying healthy is seeing the doctor for follow-up care.  If you forget your appointments, there are some things you can do to stay on track.    Notes:   05/04/20: -- reviewed recent and upcoming provider appointments with patient's spouse/ caregiver; confirmed that patient continues to drive self and that spouse assists as indicated/ needed  06/13/20: -- I am very glad to hear that you are continuing to drive and have not missed any recent doctor's appointments-  keep up the good work!     Margie Billet CM: Matintain My Quality of Life       Timeframe:  Long-Range Goal Priority:  Medium Start Date:  05/04/20                           Expected End Date:  10/20/20                    Follow Up Date 10/20/20    - complete a living will    Why is this important?   Having a long-term illness can be scary.  It can also be stressful for you and your caregiver.  These steps may help.    Notes:   06/13/20: -- Start pulmonary rehabilitation as soon as you are able- this is a great program that will help you manage your ability to tolerate activity and any shortness of breath you might have  Updated 07/12/20 Patient requested that this nurse send him advance directive documents.    Margie Billet CM: Track and Manage My Symptoms       Timeframe:  Long-Range Goal Priority:  High Start Date: 05/04/20                            Expected End Date:  12/20/20                     Follow Up Date 10/20/20   - begin a symptom diary - develop a rescue plan - eliminate symptom triggers at home - follow rescue plan if symptoms flare-up - keep follow-up appointments    Why is this important?   Tracking your symptoms and other information about  your health helps your doctor plan your care.  Write down the symptoms, the time of day, what you were doing and what medicine you are taking.  You will soon learn how to manage your symptoms.     Notes:   05/04/20: -- Smoking cessation strategies discussed with caregiver/ spouse  06/13/20: -- I am glad to hear that you have not had any recent episodes of shortness of breath and have not had to use your rescue inhaler recently: always keep your rescue inhaler prescription up dated and current, in case you need to use it -- review the enclosed information around managing your breathing at home; call your doctor right away if you develop symptoms of a COPD flare: don't wait to call for concerns: call  early -- continue your efforts to decrease/ stop smoking -- continue to limit the salt you take in through your diet -- I have placed a referral for the [facility] to contact you next month to continue providing support to you in your self-care  Updated: 07/12/20 Patient reports that his COPD is well controlled presently. Patient records all of his taken medications and tracks how he feels daily. He monitors his symptoms closely and contacts his providers as indicated per his COPD action plan.     . COMPLETED: THN CM: Track and Manage My Triggers       Timeframe:  Long-Range Goal Priority:  Medium Start Date:  05/04/20                           Expected End Date:  07/12/20                     Follow Up Date 07/12/20   - eliminate smoking in my home and automobile    Why is this important?   Triggers are activities or things, like tobacco smoke or cold weather, that make your COPD (chronic obstructive pulmonary disease) flare-up.  Knowing these triggers helps you plan how to stay away from them.  When you cannot remove them, you can learn how to manage them.     Notes:   05/04/20: -- smoking cessation encouraged; strategies to decrease smoking discussed with caregiver/  spouse  06/13/20: -- Start pulmonary rehabilitation as soon as you are able- this is a great program that will help you manage your ability to tolerate activity and any shortness of breath you might have -- review the enclosed information around managing your breathing at home; call your doctor right away if you develop symptoms of a COPD flare: don't wait to call for concerns: call early -- continue your efforts to decrease/ stop smoking -- continue to limit the salt you take in through your diet -- I have placed a referral for the [facility] to contact you next month to continue providing support to you in your self-care  Updated: 07/12/20 Patient reports that his COPD is currently under control. The Stiolto inhaler is working very well and patient states he very seldom needs to use his rescue inhaler. Adding that it has been months since he last used his rescue inhaler. Patient states that he is not ready to quit smoking at this time. He does explain that he has cut back on the amount of cigarettes he smokes daily. Patient reports that his home environment is safe and denies that he is bothered by outside triggers.

## 2020-07-17 DIAGNOSIS — R338 Other retention of urine: Secondary | ICD-10-CM | POA: Diagnosis not present

## 2020-07-20 ENCOUNTER — Ambulatory Visit: Payer: Medicare Other | Admitting: Cardiology

## 2020-07-20 NOTE — Telephone Encounter (Signed)
Pt is not interested in the pulmonary rehab program. Closed referral.

## 2020-07-24 ENCOUNTER — Other Ambulatory Visit: Payer: Self-pay | Admitting: *Deleted

## 2020-07-24 ENCOUNTER — Telehealth: Payer: Self-pay | Admitting: *Deleted

## 2020-07-24 DIAGNOSIS — C3490 Malignant neoplasm of unspecified part of unspecified bronchus or lung: Secondary | ICD-10-CM

## 2020-07-24 NOTE — Telephone Encounter (Signed)
[id number]; Called patient regarding his appointments for lab and Dr. Julien Nordmann on Monday 07/30/20.  Patient confirmed date/time of appointments.  Informed patient that we will be collecting extra blood for the research study during this visit.  Also informed patient of new updated [employer] form for the study.  Asked patient if he would be okay reviewing this form with research nurse on his visit.  Patient agreed.  Thanked patient for his ongoing support of this study and look forward to meeting with him on Monday.  Foye Spurling, BSN, RN Clinical Research Nurse 07/24/2020 2:34 PM

## 2020-07-30 ENCOUNTER — Telehealth: Payer: Self-pay | Admitting: Internal Medicine

## 2020-07-30 ENCOUNTER — Inpatient Hospital Stay: Payer: Medicare Other | Admitting: Internal Medicine

## 2020-07-30 ENCOUNTER — Telehealth: Payer: Self-pay | Admitting: Medical Oncology

## 2020-07-30 ENCOUNTER — Inpatient Hospital Stay: Payer: Medicare Other

## 2020-07-30 DIAGNOSIS — I10 Essential (primary) hypertension: Secondary | ICD-10-CM | POA: Diagnosis not present

## 2020-07-30 NOTE — Telephone Encounter (Signed)
Pt is not at home. Schedule message sent to r/s appt,

## 2020-07-30 NOTE — Telephone Encounter (Signed)
Scheduled appt per 2/7 sch msg - pt aware of appt on 2/10

## 2020-08-02 ENCOUNTER — Other Ambulatory Visit: Payer: Self-pay

## 2020-08-02 ENCOUNTER — Inpatient Hospital Stay: Payer: Medicare Other | Admitting: Internal Medicine

## 2020-08-02 ENCOUNTER — Inpatient Hospital Stay: Payer: Medicare Other | Attending: Physician Assistant

## 2020-08-02 ENCOUNTER — Encounter: Payer: Self-pay | Admitting: *Deleted

## 2020-08-02 VITALS — BP 142/72 | HR 79 | Temp 97.0°F | Resp 20 | Ht 70.0 in | Wt 177.7 lb

## 2020-08-02 DIAGNOSIS — C3412 Malignant neoplasm of upper lobe, left bronchus or lung: Secondary | ICD-10-CM | POA: Insufficient documentation

## 2020-08-02 DIAGNOSIS — I10 Essential (primary) hypertension: Secondary | ICD-10-CM | POA: Insufficient documentation

## 2020-08-02 DIAGNOSIS — C771 Secondary and unspecified malignant neoplasm of intrathoracic lymph nodes: Secondary | ICD-10-CM | POA: Diagnosis present

## 2020-08-02 DIAGNOSIS — J449 Chronic obstructive pulmonary disease, unspecified: Secondary | ICD-10-CM | POA: Insufficient documentation

## 2020-08-02 DIAGNOSIS — E785 Hyperlipidemia, unspecified: Secondary | ICD-10-CM | POA: Insufficient documentation

## 2020-08-02 DIAGNOSIS — Z006 Encounter for examination for normal comparison and control in clinical research program: Secondary | ICD-10-CM | POA: Diagnosis present

## 2020-08-02 DIAGNOSIS — C3410 Malignant neoplasm of upper lobe, unspecified bronchus or lung: Secondary | ICD-10-CM

## 2020-08-02 DIAGNOSIS — E119 Type 2 diabetes mellitus without complications: Secondary | ICD-10-CM | POA: Insufficient documentation

## 2020-08-02 DIAGNOSIS — N400 Enlarged prostate without lower urinary tract symptoms: Secondary | ICD-10-CM | POA: Insufficient documentation

## 2020-08-02 DIAGNOSIS — Z85820 Personal history of malignant melanoma of skin: Secondary | ICD-10-CM | POA: Insufficient documentation

## 2020-08-02 DIAGNOSIS — N189 Chronic kidney disease, unspecified: Secondary | ICD-10-CM | POA: Insufficient documentation

## 2020-08-02 DIAGNOSIS — Z79899 Other long term (current) drug therapy: Secondary | ICD-10-CM | POA: Insufficient documentation

## 2020-08-02 DIAGNOSIS — D61818 Other pancytopenia: Secondary | ICD-10-CM | POA: Insufficient documentation

## 2020-08-02 DIAGNOSIS — C3490 Malignant neoplasm of unspecified part of unspecified bronchus or lung: Secondary | ICD-10-CM

## 2020-08-02 DIAGNOSIS — Z7984 Long term (current) use of oral hypoglycemic drugs: Secondary | ICD-10-CM | POA: Insufficient documentation

## 2020-08-02 DIAGNOSIS — Z7982 Long term (current) use of aspirin: Secondary | ICD-10-CM | POA: Insufficient documentation

## 2020-08-02 LAB — MAGNESIUM: Magnesium: 1.9 mg/dL (ref 1.7–2.4)

## 2020-08-02 LAB — CBC WITH DIFFERENTIAL (CANCER CENTER ONLY)
Abs Immature Granulocytes: 0.03 10*3/uL (ref 0.00–0.07)
Basophils Absolute: 0.1 10*3/uL (ref 0.0–0.1)
Basophils Relative: 1 %
Eosinophils Absolute: 0.5 10*3/uL (ref 0.0–0.5)
Eosinophils Relative: 6 %
HCT: 44 % (ref 39.0–52.0)
Hemoglobin: 14.6 g/dL (ref 13.0–17.0)
Immature Granulocytes: 0 %
Lymphocytes Relative: 22 %
Lymphs Abs: 1.7 10*3/uL (ref 0.7–4.0)
MCH: 29.6 pg (ref 26.0–34.0)
MCHC: 33.2 g/dL (ref 30.0–36.0)
MCV: 89.1 fL (ref 80.0–100.0)
Monocytes Absolute: 0.7 10*3/uL (ref 0.1–1.0)
Monocytes Relative: 9 %
Neutro Abs: 4.6 10*3/uL (ref 1.7–7.7)
Neutrophils Relative %: 62 %
Platelet Count: 183 10*3/uL (ref 150–400)
RBC: 4.94 MIL/uL (ref 4.22–5.81)
RDW: 13.2 % (ref 11.5–15.5)
WBC Count: 7.6 10*3/uL (ref 4.0–10.5)
nRBC: 0 % (ref 0.0–0.2)

## 2020-08-02 LAB — CMP (CANCER CENTER ONLY)
ALT: 15 U/L (ref 0–44)
AST: 17 U/L (ref 15–41)
Albumin: 4.1 g/dL (ref 3.5–5.0)
Alkaline Phosphatase: 99 U/L (ref 38–126)
Anion gap: 8 (ref 5–15)
BUN: 21 mg/dL (ref 8–23)
CO2: 29 mmol/L (ref 22–32)
Calcium: 10.4 mg/dL — ABNORMAL HIGH (ref 8.9–10.3)
Chloride: 103 mmol/L (ref 98–111)
Creatinine: 1.21 mg/dL (ref 0.61–1.24)
GFR, Estimated: >60 mL/min (ref >60)
Glucose, Bld: 114 mg/dL — ABNORMAL HIGH (ref 70–99)
Potassium: 4.2 mmol/L (ref 3.5–5.1)
Sodium: 140 mmol/L (ref 135–145)
Total Bilirubin: 0.5 mg/dL (ref 0.3–1.2)
Total Protein: 6.9 g/dL (ref 6.5–8.1)

## 2020-08-02 LAB — RESEARCH LABS

## 2020-08-02 NOTE — Progress Notes (Signed)
[place] Telephone:([phone]) [phone]   Fax:(336) [place], [place] [address] [place] [address]  DIAGNOSIS: stageIIIa(T2a, N2,M0)non-small cell lung cancer, adenocarcinoma. Patient presented with a left upper lobe nodule.He was diagnosed in March 2021  Biomarkers/PDL1:  Detected EGFR mutation in exon 21 ? Positive for the EGFR P848L mutation in exon 21. No mutation was detected in exons 12, 18, 19, and 20. The clinical significance of P848L in exon 21 is uncertain. This variant has been reported to behave like functionally silent polymorphism. [name], M.M., et al. Mol Cancer 6, 56 (2007). ? PD-L1 Expression Level: TPS < 1% PD-L1 Expression Status: No Expression NEGATIVE FOR ALK REARRANGEMENT  PRIOR THERAPY: 1) Robotic assisted wedge resection of the left upper lobe under the care of Dr. Servando Snare. This was performed on 10/28/2019. 2) Adjuvant systemic chemotherapy on the [facility] A 300762 protocol with cisplatin 70 mg/M2, pemetrexed 500 mg/M2 every 3 weeks in addition to pembrolizumab 400 mg IV every 6 weeks. Start date December 19, 2019.  Status post 2 cycles.  This was discontinued secondary to intolerance.  CURRENT THERAPY:  Observation.  INTERVAL HISTORY: Chad Gomez 75 y.o. male returns to the clinic today for follow-up visit accompanied by his wife.  The patient is feeling fine today with no concerning complaints.  He denied having any current chest pain, shortness of breath, cough or hemoptysis.  He has no nausea, vomiting, diarrhea or constipation.  He denied having any headache or visual changes.  He increased his dose of gabapentin to 300 mg 3 times a day on his own and he had a lot of dizzy spells and confusion.  He contacted his pain management clinic and they reduce the dose to 200 mg p.o. 3 times daily.  The patient is here today for evaluation and repeat blood work according to the study  protocol.    MEDICAL HISTORY: Past Medical History:  Diagnosis Date  . BPH (benign prostatic hypertrophy)    weak stream  . Chronic kidney disease   . Complication of anesthesia    urinary retention  . COPD (chronic obstructive pulmonary disease) ([place])   . Diabetes mellitus without complication ([place])    Type II  . History of bladder stone   . History of colon polyps    08/2013   pre-canerous  . History of kidney stones   . History of melanoma excision    LEFT EAR  . Hyperlipidemia   . Hypertension   . Iron deficiency   . Lung cancer, lingula ([place]) 11/01/2019  . Open wound    left lower jaw; followed by ENT  . Peripheral vascular disease ([place])   . Right ureteral stone   . Shingles 05-15-2015    ALLERGIES:  is allergic to morphine and related, tape, amoxicillin, and naproxen.  MEDICATIONS:  Current Outpatient Medications  Medication Sig Dispense Refill  . acetaminophen (TYLENOL) 500 MG tablet Take 2 tablets (1,000 mg total) by mouth every 6 (six) hours as needed for mild pain. 30 tablet 0  . albuterol (VENTOLIN HFA) 108 (90 Base) MCG/ACT inhaler Inhale 2 puffs into the lungs every 6 (six) hours as needed for wheezing or shortness of breath. 8 g 3  . aspirin EC 81 MG tablet Take 81 mg by mouth daily.    Marland Kitchen atorvastatin (LIPITOR) 40 MG tablet Take 40 mg by mouth daily.    . Cholecalciferol (VITAMIN D-3) 1000 units  CAPS Take 1,000 Units by mouth daily.     Marland Kitchen ezetimibe (ZETIA) 10 MG tablet Take 5 mg by mouth daily.    . finasteride (PROSCAR) 5 MG tablet Take 5 mg by mouth daily.     . fluticasone (FLONASE) 50 MCG/ACT nasal spray Place 2 sprays into both nostrils daily.     . folic acid (FOLVITE) 1 MG tablet Take 1 mg by mouth daily. (Patient not taking: Reported on 07/12/2020)    . gabapentin (NEURONTIN) 100 MG capsule Take 200 mg by mouth 3 (three) times daily.    . K Phos Mono-Sod Phos Di & Mono (K-PHOS-NEUTRAL) [phone] MG TABS Take 2 tablets by mouth in the morning and at  bedtime. 120 tablet 0  . magnesium oxide (MAG-OX) 400 (241.3 Mg) MG tablet Take 1 tablet (400 mg total) by mouth 2 (two) times daily. (Patient not taking: Reported on 07/12/2020) 60 tablet 0  . metFORMIN (GLUCOPHAGE-XR) 500 MG 24 hr tablet Take 500-1,000 mg by mouth See admin instructions. Take 500 mg every morning and take 1000 mg at supper    . Nutritional Supplements (FEEDING SUPPLEMENT, KATE FARMS STANDARD 1.4,) LIQD liquid Take 325 mLs by mouth 2 (two) times daily between meals. 325 mL 0  . potassium citrate (UROCIT-K) 10 MEQ (1080 MG) SR tablet Take 10 mEq by mouth in the morning and at bedtime.     . Tiotropium Bromide-Olodaterol (STIOLTO RESPIMAT) 2.5-2.5 MCG/ACT AERS Inhale 2 puffs into the lungs daily. 12 g 3  . traMADol (ULTRAM) 50 MG tablet Take 1 tablet (50 mg total) by mouth every 6 (six) hours as needed (mild pain). 20 tablet 0   No current facility-administered medications for this visit.    SURGICAL HISTORY:  Past Surgical History:  Procedure Laterality Date  . ABDOMINAL AORTAGRAM  11/01/2014   Procedure: Abdominal Aortagram;  Surgeon: Serafina Mitchell, MD;  Location: [place] [facility];  Service: Cardiovascular;;  . AORTA - BILATERAL FEMORAL ARTERY BYPASS GRAFT N/A 12/28/2014   Procedure: AORTOBIFEMORAL BYPASS GRAFT;  Surgeon: Serafina Mitchell, MD;  Location: [facility];  Service: Vascular;  Laterality: N/A;  . BRONCHIAL BRUSHINGS  09/06/2019   Procedure: BRONCHIAL BRUSHINGS;  Surgeon: Collene Gobble, MD;  Location: [facility] ENDOSCOPY;  Service: Pulmonary;;  left upper lobe nodule  . BRONCHIAL WASHINGS  09/06/2019   Procedure: BRONCHIAL WASHINGS;  Surgeon: Collene Gobble, MD;  Location: [facility] ENDOSCOPY;  Service: Pulmonary;;  . COLONOSCOPY W/ POLYPECTOMY  09-13-2013  . CYSTOLITHALOPAXY OF BLADDER STONE/ TRANSRECTAL ULTRASOUND PROSTATE BX  08-19-2005  . CYSTOSCOPY W/ RETROGRADES Right 12/02/2012   Procedure: CYSTOSCOPY WITH RETROGRADE PYELOGRAM;  Surgeon: Malka So, MD;  Location: [facility];  Service: Urology;  Laterality: Right;  . CYSTOSCOPY W/ RETROGRADES Bilateral 10/13/2013   Procedure: CYSTOSCOPY WITH RETROGRADE PYELOGRAM;  Surgeon: Irine Seal, MD;  Location: [facility];  Service: Urology;  Laterality: Bilateral;  . CYSTOSCOPY W/ URETERAL STENT PLACEMENT Left 04/11/2014   Procedure: CYSTOSCOPY WITH STENT REPLACEMENT;  Surgeon: Malka So, MD;  Location: [facility];  Service: Urology;  Laterality: Left;  . CYSTOSCOPY WITH LITHOLAPAXY Right 12/02/2012   Procedure: CYSTOSCOPY WITH stone extraction;  Surgeon: Malka So, MD;  Location: [facility];  Service: Urology;  Laterality: Right;  . CYSTOSCOPY WITH LITHOLAPAXY Right 10/13/2013   Procedure: CYSTOSCOPY WITH LITHOLAPAXY, removal of urethral stone with basket;  Surgeon: Irine Seal, MD;  Location: [facility];  Service: Urology;  Laterality: Right;  . CYSTOSCOPY WITH RETROGRADE PYELOGRAM, URETEROSCOPY AND STENT PLACEMENT Left 03/21/2014   Procedure: CYSTOSCOPY WITH RETROGRADE PYELOGRAM, AND LEFT STENT PLACEMENT;  Surgeon: Alexis Frock, MD;  Location: [facility];  Service: Urology;  Laterality: Left;  . CYSTOSCOPY WITH RETROGRADE PYELOGRAM, URETEROSCOPY AND STENT PLACEMENT  09/09/2016   Procedure: CYSTOSCOPY WITH RIGHT  RETROGRADE PYELOGRAM, URETEROSCOPY WITH LASER BASKET EXTRACTION OF STONES  AND STENT PLACEMENT;  Surgeon: Irine Seal, MD;  Location: [facility];  Service: Urology;;  . Consuela Mimes WITH URETEROSCOPY Left 04/11/2014   Procedure: CYSTOSCOPY WITH URETEROSCOPY/ STONE EXTRACTION;  Surgeon: Malka So, MD;  [facility];  Service: Urology;  Laterality: Left;  . FINE NEEDLE ASPIRATION  09/06/2019   Procedure: FINE NEEDLE ASPIRATION;  Surgeon: Collene Gobble, MD;  Location: [facility] ENDOSCOPY;  Service: Pulmonary;;  left upper lobe - lung  . HOLMIUM LASER APPLICATION Right 03/08/3845   Procedure: HOLMIUM LASER  APPLICATION;  Surgeon: Malka So, MD;  Location: [facility];  Service: Urology;  Laterality: Right;  . HOLMIUM LASER APPLICATION Left 65/99/3570   Procedure: HOLMIUM LASER APPLICATION;  Surgeon: Malka So, MD;  Location: [facility];  Service: Urology;  Laterality: Left;  . HOLMIUM LASER APPLICATION Right 1/77/9390   Procedure: HOLMIUM LASER APPLICATION;  Surgeon: Irine Seal, MD;  Location: [facility];  Service: Urology;  Laterality: Right;  . INGUINAL HERNIA REPAIR Right 1954  . INTERCOSTAL NERVE BLOCK Left 10/28/2019   Procedure: Intercostal Nerve Block;  Surgeon: [name], MD;  Location: [place];  Service: Thoracic;  Laterality: Left;  . LITHOTRIPSY  2007 & 02/2013  . LUNG BIOPSY  09/06/2019   Procedure: LUNG BIOPSY;  Surgeon: Collene Gobble, MD;  Location: [facility] ENDOSCOPY;  Service: Pulmonary;;  left upper lob lung  . [place]  . MICROLARYNGOSCOPY Right 02/18/2016   Procedure: MICRO DIRECT LARYNGOSCOPY EXCISIONAL BIOPSY OF RIGHT VOCAL CORD MASS;  Surgeon: Leta Baptist, MD;  Location: [place];  Service: ENT;  Laterality: Right;  . [place]   left ear  . MULTIPLE TOOTH EXTRACTIONS  1994  . NEPHROLITHOTOMY Left 01/27/2013   Procedure: NEPHROLITHOTOMY PERCUTANEOUS FIRST LOOK;  Surgeon: Malka So, MD;  Location: [facility];  Service: Urology;  Laterality: Left;  . NODE DISSECTION  10/28/2019   Procedure: Node Dissection;  Surgeon: [name], MD;  Location: [facility];  Service: Thoracic;;  . PERIPHERAL VASCULAR CATHETERIZATION N/A 11/01/2014   Procedure: Lower Extremity Angiography;  Surgeon: Serafina Mitchell, MD;  Location: [place] [facility];  Service: Cardiovascular;  Laterality: N/A;  . URETEROSCOPY Right 12/02/2012   Procedure: RIGHT URETEROSCOPY STONE EXTRACTION WITH  STENT PLACEMENT;  Surgeon: Malka So, MD;  Location: [facility];  Service: Urology;  Laterality: Right;  Marland Kitchen  VIDEO BRONCHOSCOPY  10/28/2019   VIDEO BRONCHOSCOPY WITH ENDOBRONCHIAL NAVIGATION WITH LUNG MARKINGS (N/A  . VIDEO BRONCHOSCOPY WITH ENDOBRONCHIAL NAVIGATION N/A 09/06/2019   Procedure: VIDEO BRONCHOSCOPY WITH ENDOBRONCHIAL NAVIGATION;  Surgeon: Collene Gobble, MD;  Location: [place] ENDOSCOPY;  Service: Pulmonary;  Laterality: N/A;  . VIDEO BRONCHOSCOPY WITH ENDOBRONCHIAL NAVIGATION N/A 10/28/2019   Procedure: VIDEO BRONCHOSCOPY WITH ENDOBRONCHIAL NAVIGATION WITH LUNG MARKINGS;  Surgeon: [name], MD;  Location: [place];  Service: Thoracic;  Laterality: N/A;  . WEDGE RESECTION  10/28/2019   LEFT UPPER LOBE    REVIEW OF SYSTEMS:  A comprehensive review  of systems was negative.   PHYSICAL EXAMINATION: General appearance: alert, cooperative and no distress Head: Normocephalic, without obvious abnormality, atraumatic Neck: no adenopathy, no JVD, supple, symmetrical, trachea midline and thyroid not enlarged, symmetric, no tenderness/mass/nodules Lymph nodes: Cervical, supraclavicular, and axillary nodes normal. Resp: clear to auscultation bilaterally Back: symmetric, no curvature. ROM normal. No CVA tenderness. Cardio: regular rate and rhythm, S1, S2 normal, no murmur, click, rub or gallop GI: soft, non-tender; bowel sounds normal; no masses,  no organomegaly Extremities: extremities normal, atraumatic, no cyanosis or edema  ECOG PERFORMANCE STATUS: 1 - Symptomatic but completely ambulatory  Blood pressure (!) 142/72, pulse 79, temperature (!) 97 F (36.1 C), temperature source Tympanic, resp. rate 20, height 5' 10"  (1.778 m), weight 177 lb 11.2 oz (80.6 kg), SpO2 97 %.  LABORATORY DATA: Lab Results  Component Value Date   WBC 7.1 04/27/2020   HGB 13.1 04/27/2020   HCT 39.5 04/27/2020   MCV 93.4 04/27/2020   PLT 181 04/27/2020      Chemistry      Component Value Date/Time   NA 139 04/27/2020 1358   K 4.1 04/27/2020 1358   CL 102 04/27/2020 1358   CO2 31 04/27/2020 1358   BUN  15 04/27/2020 1358   CREATININE 0.96 04/27/2020 1358      Component Value Date/Time   CALCIUM 10.2 04/27/2020 1358   ALKPHOS 105 04/27/2020 1358   AST 16 04/27/2020 1358   ALT 15 04/27/2020 1358   BILITOT 0.4 04/27/2020 1358       RADIOGRAPHIC STUDIES: No results found.  ASSESSMENT AND PLAN: This is a very pleasant 75 years old white male recently diagnosed with stage IIIa (T2 a, N2, M0) non-small cell lung cancer, adenocarcinoma presented with left upper lobe lung nodule and after resection the patient was found to have metastatic disease to the mediastinal lymphadenopathy.  This was diagnosed in March 2021.   He is currently undergoing adjuvant systemic chemotherapy with cisplatin, pemetrexed and pembrolizumab status post 2 cycles. His treatment was discontinued secondary to significant intolerability to this treatment with frequent admission to the hospital with enteritis, acute renal failure as well as pancytopenia. The patient is currently on observation and he is feeling fine today with no concerning complaints. He had repeat blood work today.  His CBC is unremarkable but the remaining blood work are still pending. I recommended for the patient to continue on observation with repeat CT scan of the chest as well as lab work on Oct 26, 2020.  He will have a follow-up appointment with me few days after the scan. The patient was advised to call immediately if he has any concerning symptoms in the interval. The patient voices understanding of current disease status and treatment options and is in agreement with the current care plan. All questions were answered. The patient knows to call the clinic with any problems, questions or concerns. We can certainly see the patient much sooner if necessary.  Disclaimer: This note was dictated with voice recognition software. Similar sounding words can inadvertently be transcribed and may not be corrected upon review.

## 2020-08-02 NOTE — Research (Signed)
[id number] Follow Up Visit: Patient into clinic with his wife today for every 3 month follow up visit on study.  Hippa Form; Met with patient prior to his Lab and MD appointment to review and sign the updated study hippa form, [place] approved 07/02/2020. Informed patient of updates made to the hippa form by our local IRB, including the section about not having access to certain parts of your medical records if you are on a blinded study. Patient voiced understanding of this section and we also discussed he is not on a blinded study, so his access to his medical record is not limited by participating in this study. Patient reviewed the rest of the form and he denied any questions about how his health information is used on this study. He signed and dated the form where indicated. A copy of the signed form was given to patient for his records. 5 minutes was spent in reviewing and signing updated hippa form.  Labs; Optional research specimens collected today per protocol. Patient was aware.  H & P; Patient seen by Dr. Julien Nordmann today.  See MD encounter dated today for details of visit.  AE Review: Patient denies any serious adverse events since last study visit.  Plan;  Informed patient and Dr. Julien Nordmann of next study visit due on 10/29/20 with CT scan on 10/26/20.  Research nurse will schedule CT scan for 10/26/20 and Lab/MD for 10/29/20.  Patient verbalized understanding and agreement to this plan.  Thanked patient for his ongoing participation in this study.  Foye Spurling, BSN, RN Clinical Research Nurse 08/02/2020 11:27 AM

## 2020-08-14 DIAGNOSIS — R338 Other retention of urine: Secondary | ICD-10-CM | POA: Diagnosis not present

## 2020-08-16 NOTE — Progress Notes (Signed)
Cardiology Office Note   Date:  08/17/2020   ID:  Chad, Gomez [birth year], MRN [id number]  PCP:  [name], MD  Cardiologist:   Minus Breeding, MD Referring:  Ceasar Mons, MD  Chief Complaint  Patient presents with  . PVCs      History of Present Illness: Chad Gomez is a 75 y.o. male who was sent by Dr. Servando Snare for preop clearance prior to resection of a small cell lung cancer.  He is seen by Dr. Lamonte Sakai for evaluation of severe COPD.    Since I last saw him preop last year and he had a negative Lexiscan Myoview.  He has stage IIIa non-small cell lung cancer of the left upper lobe with metastatic  lymphadenopathy.  He had wedge resection robotic assisted.  He did not tolerate chemotherapy and is currently off any other therapies.  He says he is feeling well.  He denies any cardiovascular symptoms.  He is not very active but with his levels of activity he denies any chest discomfort, neck or arm discomfort.  He has some mild cough but is not having any new shortness of breath, PND or orthopnea.  He is not having any new palpitations, presyncope or syncope.  He has had no weight gain or edema.   Past Medical History:  Diagnosis Date  . BPH (benign prostatic hypertrophy)    weak stream  . Chronic kidney disease   . Complication of anesthesia    urinary retention  . COPD (chronic obstructive pulmonary disease) ([place])   . Diabetes mellitus without complication ([place])    Type II  . History of bladder stone   . History of colon polyps    08/2013   pre-canerous  . History of kidney stones   . History of melanoma excision    LEFT EAR  . Hyperlipidemia   . Hypertension   . Iron deficiency   . Lung cancer, lingula ([place]) 11/01/2019  . Open wound    left lower jaw; followed by ENT  . Peripheral vascular disease ([place])   . Right ureteral stone   . Shingles 05-15-2015    Past Surgical History:  Procedure Laterality Date  . ABDOMINAL AORTAGRAM  11/01/2014    Procedure: Abdominal Aortagram;  Surgeon: Serafina Mitchell, MD;  Location: [place] [facility];  Service: Cardiovascular;;  . AORTA - BILATERAL FEMORAL ARTERY BYPASS GRAFT N/A 12/28/2014   Procedure: AORTOBIFEMORAL BYPASS GRAFT;  Surgeon: Serafina Mitchell, MD;  Location: [place];  Service: Vascular;  Laterality: N/A;  . BRONCHIAL BRUSHINGS  09/06/2019   Procedure: BRONCHIAL BRUSHINGS;  Surgeon: Collene Gobble, MD;  Location: [facility];  Service: Pulmonary;;  left upper lobe nodule  . BRONCHIAL WASHINGS  09/06/2019   Procedure: BRONCHIAL WASHINGS;  Surgeon: Collene Gobble, MD;  Location: [facility] ENDOSCOPY;  Service: Pulmonary;;  . COLONOSCOPY W/ POLYPECTOMY  09-13-2013  . CYSTOLITHALOPAXY OF BLADDER STONE/ TRANSRECTAL ULTRASOUND PROSTATE BX  08-19-2005  . CYSTOSCOPY W/ RETROGRADES Right 12/02/2012   Procedure: CYSTOSCOPY WITH RETROGRADE PYELOGRAM;  Surgeon: Malka So, MD;  Location: [facility];  Service: Urology;  Laterality: Right;  . CYSTOSCOPY W/ RETROGRADES Bilateral 10/13/2013   Procedure: CYSTOSCOPY WITH RETROGRADE PYELOGRAM;  Surgeon: Irine Seal, MD;  Location: [facility];  Service: Urology;  Laterality: Bilateral;  . CYSTOSCOPY W/ URETERAL STENT PLACEMENT Left 04/11/2014   Procedure: CYSTOSCOPY WITH STENT REPLACEMENT;  Surgeon: Malka So, MD;  Location: [place]  [facility];  Service: Urology;  Laterality: Left;  . CYSTOSCOPY WITH LITHOLAPAXY Right 12/02/2012   Procedure: CYSTOSCOPY WITH stone extraction;  Surgeon: Malka So, MD;  Location: [facility];  Service: Urology;  Laterality: Right;  . CYSTOSCOPY WITH LITHOLAPAXY Right 10/13/2013   Procedure: CYSTOSCOPY WITH LITHOLAPAXY, removal of urethral stone with basket;  Surgeon: Irine Seal, MD;  Location: [facility];  Service: Urology;  Laterality: Right;  . CYSTOSCOPY WITH RETROGRADE PYELOGRAM, URETEROSCOPY AND STENT PLACEMENT Left 03/21/2014   Procedure: CYSTOSCOPY WITH  RETROGRADE PYELOGRAM, AND LEFT STENT PLACEMENT;  Surgeon: Alexis Frock, MD;  Location: [facility];  Service: Urology;  Laterality: Left;  . CYSTOSCOPY WITH RETROGRADE PYELOGRAM, URETEROSCOPY AND STENT PLACEMENT  09/09/2016   Procedure: CYSTOSCOPY WITH RIGHT  RETROGRADE PYELOGRAM, URETEROSCOPY WITH LASER BASKET EXTRACTION OF STONES  AND STENT PLACEMENT;  Surgeon: Irine Seal, MD;  Location: [facility];  Service: Urology;;  . Consuela Mimes WITH URETEROSCOPY Left 04/11/2014   Procedure: CYSTOSCOPY WITH URETEROSCOPY/ STONE EXTRACTION;  Surgeon: Malka So, MD;  Location: [facility];  Service: Urology;  Laterality: Left;  . FINE NEEDLE ASPIRATION  09/06/2019   Procedure: FINE NEEDLE ASPIRATION;  Surgeon: Collene Gobble, MD;  Location: [facility]COPY;  Service: Pulmonary;;  left upper lobe - lung  . HOLMIUM LASER APPLICATION Right 9/52/8413   Procedure: HOLMIUM LASER APPLICATION;  Surgeon: Malka So, MD;  Location: [facility];  Service: Urology;  Laterality: Right;  . HOLMIUM LASER APPLICATION Left 24/40/1027   Procedure: HOLMIUM LASER APPLICATION;  Surgeon: Malka So, MD;  Location: [facility];  Service: Urology;  Laterality: Left;  . HOLMIUM LASER APPLICATION Right 2/53/6644   Procedure: HOLMIUM LASER APPLICATION;  Surgeon: Irine Seal, MD;  Location: [facility];  Service: Urology;  Laterality: Right;  . INGUINAL HERNIA REPAIR Right 1954  . INTERCOSTAL NERVE BLOCK Left 10/28/2019   Procedure: Intercostal Nerve Block;  Surgeon: [name], MD;  Location: [place];  Service: Thoracic;  Laterality: Left;  . LITHOTRIPSY  2007 & 02/2013  . LUNG BIOPSY  09/06/2019   Procedure: LUNG BIOPSY;  Surgeon: Collene Gobble, MD;  Location: [facility];  Service: Pulmonary;;  left upper lob lung  . [place]  . MICROLARYNGOSCOPY Right 02/18/2016   Procedure: MICRO DIRECT LARYNGOSCOPY EXCISIONAL BIOPSY OF RIGHT VOCAL  CORD MASS;  Surgeon: Leta Baptist, MD;  Location: [place];  Service: ENT;  Laterality: Right;  . [place]   left ear  . MULTIPLE TOOTH EXTRACTIONS  1994  . NEPHROLITHOTOMY Left 01/27/2013   Procedure: NEPHROLITHOTOMY PERCUTANEOUS FIRST LOOK;  Surgeon: Malka So, MD;  Location[facility];  Service: Urology;  Laterality: Left;  . NODE DISSECTION  10/28/2019   Procedure: Node Dissection;  Surgeon: [name], MD;  Location: [facility];  Service: Thoracic;;  . PERIPHERAL VASCULAR CATHETERIZATION N/A 11/01/2014   Procedure: Lower Extremity Angiography;  Surgeon: Serafina Mitchell, MD;  Location: [place] [facility];  Service: Cardiovascular;  Laterality: N/A;  . URETEROSCOPY Right 12/02/2012   Procedure: RIGHT URETEROSCOPY STONE EXTRACTION WITH  STENT PLACEMENT;  Surgeon: Malka So, MD;  Location: [facility];  Service: Urology;  Laterality: Right;  Marland Kitchen VIDEO BRONCHOSCOPY  10/28/2019   VIDEO BRONCHOSCOPY WITH ENDOBRONCHIAL NAVIGATION WITH LUNG MARKINGS (N/A  . VIDEO BRONCHOSCOPY WITH ENDOBRONCHIAL NAVIGATION N/A 09/06/2019   Procedure: VIDEO BRONCHOSCOPY WITH ENDOBRONCHIAL NAVIGATION;  Surgeon: Collene Gobble, MD;  Location: [facility] ENDOSCOPY;  Service: Pulmonary;  Laterality: N/A;  . VIDEO BRONCHOSCOPY WITH ENDOBRONCHIAL NAVIGATION N/A 10/28/2019   Procedure: VIDEO BRONCHOSCOPY WITH ENDOBRONCHIAL NAVIGATION WITH LUNG MARKINGS;  Surgeon: [name], MD;  Location: [place];  Service: Thoracic;  Laterality: N/A;  . WEDGE RESECTION  10/28/2019   LEFT UPPER LOBE     Current Outpatient Medications  Medication Sig Dispense Refill  . acetaminophen (TYLENOL) 500 MG tablet Take 2 tablets (1,000 mg total) by mouth every 6 (six) hours as needed for mild pain. 30 tablet 0  . albuterol (VENTOLIN HFA) 108 (90 Base) MCG/ACT inhaler Inhale 2 puffs into the lungs every 6 (six) hours as needed for wheezing or shortness of breath. 8 g 3  . aspirin EC 81 MG tablet Take 81 mg by  mouth daily.    Marland Kitchen atorvastatin (LIPITOR) 40 MG tablet Take 40 mg by mouth daily.    . Cholecalciferol (VITAMIN D-3) 1000 units CAPS Take 1,000 Units by mouth daily.     Marland Kitchen ezetimibe (ZETIA) 10 MG tablet Take 5 mg by mouth daily.    . finasteride (PROSCAR) 5 MG tablet Take 5 mg by mouth daily.     . fluticasone (FLONASE) 50 MCG/ACT nasal spray Place 2 sprays into both nostrils daily.     . folic acid (FOLVITE) 1 MG tablet Take 1 mg by mouth daily.    Marland Kitchen gabapentin (NEURONTIN) 100 MG capsule Take 200 mg by mouth 3 (three) times daily. Taking 500mg  daily    . metFORMIN (GLUCOPHAGE-XR) 500 MG 24 hr tablet Take 1,000 mg by mouth See admin instructions. Take 1000mg  every morning and take 1000 mg at supper    . potassium citrate (UROCIT-K) 10 MEQ (1080 MG) SR tablet Take 10 mEq by mouth in the morning and at bedtime.     . Tiotropium Bromide-Olodaterol (STIOLTO RESPIMAT) 2.5-2.5 MCG/ACT AERS Inhale 2 puffs into the lungs daily. 12 g 3  . traMADol (ULTRAM) 50 MG tablet Take 1 tablet (50 mg total) by mouth every 6 (six) hours as needed (mild pain). 20 tablet 0   No current facility-administered medications for this visit.    Allergies:   Morphine and related, Tape, Amoxicillin, and Naproxen    ROS:  Please see the history of present illness.   Otherwise, review of systems are positive for none.   All other systems are reviewed and negative.    PHYSICAL EXAM: VS:  BP 128/62 (BP Location: Left Arm, Patient Position: Sitting)   Pulse 81   Ht 5\' 10"  (1.778 m)   Wt 176 lb 3.2 oz (79.9 kg)   BMI 25.28 kg/m  , BMI Body mass index is 25.28 kg/m. GENERAL:  Well appearing NECK:  No jugular venous distention, waveform within normal limits, carotid upstroke brisk and symmetric, no bruits, no thyromegaly LUNGS: Decreased breath sounds bilaterally with few expiratory rhonchi CHEST:  Unremarkable HEART:  PMI not displaced or sustained,S1 and S2 within normal limits, no S3, no S4, no clicks, no rubs, no  murmurs ABD:  Flat, positive bowel sounds normal in frequency in pitch, no bruits, no rebound, no guarding, no midline pulsatile mass, no hepatomegaly, no splenomegaly EXT:  2 plus pulses throughout, no edema, no cyanosis no clubbing   EKG:  EKG is  ordered today. The ekg ordered today demonstrates sinus rhythm, rate 82, axis within normal limits, intervals within normal limits, RSR prime V1 V2, low voltage chest leads.  Ventricular ectopy  in a bigeminal pattern   Recent Labs: 01/30/2020: TSH 2.649 08/02/2020: ALT 15; BUN 21; Creatinine 1.21; Hemoglobin 14.6; Magnesium 1.9; Platelet Count 183; Potassium 4.2; Sodium 140    Lipid Panel    Component Value Date/Time   TRIG 137 01/02/2020 0459      Wt Readings from Last 3 Encounters:  08/17/20 176 lb 3.2 oz (79.9 kg)  08/02/20 177 lb 11.2 oz (80.6 kg)  06/14/20 175 lb (79.4 kg)      Other studies Reviewed: Additional studies/ records that were reviewed today include: Heme Onc notes. Review of the above records demonstrates:  Please see elsewhere in the note.     ASSESSMENT AND PLAN:  PVD: He has had no symptoms related to this.  He actually has good pulses.  No change in therapy.   HTN: Blood pressure is controlled.  No change in therapy.   PVCS: He is not noticing these.  No change in therapy.  DYSLIPIDEMIA: LDL recently was 52 with an HDL of 43.  He will continue the meds as listed.   TOBACCO ABUSE:   He was unable to quit smoking.   DM: A1c is 6.1.  No change in therapy.    Current medicines are reviewed at length with the patient today.  The patient does not have concerns regarding medicines.  The following changes have been made:  no change  Labs/ tests ordered today include:   Orders Placed This Encounter  Procedures  . EKG 12-Lead     Disposition:   FU with me in one year.       Signed, Minus Breeding, MD  08/17/2020 5:32 PM    [place]

## 2020-08-17 ENCOUNTER — Ambulatory Visit: Payer: Medicare Other | Admitting: Cardiology

## 2020-08-17 ENCOUNTER — Encounter: Payer: Self-pay | Admitting: Cardiology

## 2020-08-17 ENCOUNTER — Other Ambulatory Visit: Payer: Self-pay

## 2020-08-17 VITALS — BP 128/62 | HR 81 | Ht 70.0 in | Wt 176.2 lb

## 2020-08-17 DIAGNOSIS — I70213 Atherosclerosis of native arteries of extremities with intermittent claudication, bilateral legs: Secondary | ICD-10-CM

## 2020-08-17 NOTE — Patient Instructions (Signed)
Medication Instructions:  No changes *If you need a refill on your cardiac medications before your next appointment, please call your pharmacy*  Follow-Up: At [facility], you and your health needs are our priority.  As part of our continuing mission to provide you with exceptional heart care, we have created designated Provider Care Teams.  These Care Teams include your primary Cardiologist (physician) and Advanced Practice Providers (APPs -  Physician Assistants and Nurse Practitioners) who all work together to provide you with the care you need, when you need it.  We recommend signing up for the patient portal called "MyChart".  Sign up information is provided on this After Visit Summary.  MyChart is used to connect with patients for Virtual Visits (Telemedicine).  Patients are able to view lab/test results, encounter notes, upcoming appointments, etc.  Non-urgent messages can be sent to your provider as well.   To learn more about what you can do with MyChart, go to [email or web address].    Your next appointment:   12 month(s) You will receive a reminder letter in the mail two months in advance. If you don't receive a letter, please call our office to schedule the follow-up appointment.  The format for your next appointment:   In Person  Provider:   Minus Breeding, MD

## 2020-08-19 ENCOUNTER — Other Ambulatory Visit: Payer: Self-pay | Admitting: Urology

## 2020-08-21 ENCOUNTER — Encounter: Payer: Self-pay | Admitting: Pulmonary Disease

## 2020-08-21 ENCOUNTER — Other Ambulatory Visit: Payer: Self-pay

## 2020-08-21 ENCOUNTER — Ambulatory Visit: Payer: Medicare Other | Admitting: Pulmonary Disease

## 2020-08-21 VITALS — BP 110/70 | HR 44 | Temp 97.5°F | Ht 70.0 in | Wt 177.0 lb

## 2020-08-21 DIAGNOSIS — Z720 Tobacco use: Secondary | ICD-10-CM

## 2020-08-21 DIAGNOSIS — J432 Centrilobular emphysema: Secondary | ICD-10-CM

## 2020-08-21 DIAGNOSIS — C3492 Malignant neoplasm of unspecified part of left bronchus or lung: Secondary | ICD-10-CM

## 2020-08-21 NOTE — Progress Notes (Signed)
Subjective:   PATIENT ID: Chad Gomez GENDER: male DOB: 09-10-1945, MRN: [id number]   HPI  Chief Complaint  Patient presents with  . Follow-up    Pt stated that he was told that his heart skips a beat.  Breathing has been good.      Reason for Visit: Follow-up   Chad Gomez is a 75 year old male active smoker with very severe COPD/emphysema, stage IIIa adenocarcinoma of the left lung s/p resection and chemotherapy (incomplete due to intolerance) who presents with follow-up.   Since our last visit, he reports he is tolerating Stiolto. Rarely uses albuterol He is working on weight gain. Previously did water aerobics pre-pandemic four years ago. Still working on low impact strengthening exercise. Otherwise states he is not that active. Denies cough and wheezing. Has shortness of breath that seems to occur with carrying groceries and upstairs. He still smokes 2 ppd. He states he is interested in Pulmonary Rehab but has not been contacted.  Social History: Currently smoker 1ppd. Previously 2ppd.  I have personally reviewed patient's past medical/family/social history/allergies/current medications.  Past Medical History:  Diagnosis Date  . BPH (benign prostatic hypertrophy)    weak stream  . Chronic kidney disease   . Complication of anesthesia    urinary retention  . COPD (chronic obstructive pulmonary disease) ([place])   . Diabetes mellitus without complication ([place])    Type II  . History of bladder stone   . History of colon polyps    08/2013   pre-canerous  . History of kidney stones   . History of melanoma excision    LEFT EAR  . Hyperlipidemia   . Hypertension   . Iron deficiency   . Lung cancer, lingula ([place]) 11/01/2019  . Open wound    left lower jaw; followed by ENT  . Peripheral vascular disease ([place])   . Right ureteral stone   . Shingles 05-15-2015      Outpatient Medications Prior  to Visit  Medication Sig Dispense Refill  . acetaminophen (TYLENOL) 500 MG tablet Take 2 tablets (1,000 mg total) by mouth every 6 (six) hours as needed for mild pain. 30 tablet 0  . albuterol (VENTOLIN HFA) 108 (90 Base) MCG/ACT inhaler Inhale 2 puffs into the lungs every 6 (six) hours as needed for wheezing or shortness of breath. 8 g 3  . aspirin EC 81 MG tablet Take 81 mg by mouth daily.    Marland Kitchen atorvastatin (LIPITOR) 40 MG tablet Take 40 mg by mouth daily.    . Cholecalciferol (VITAMIN D-3) 1000 units CAPS Take 1,000 Units by mouth daily.     Marland Kitchen ezetimibe (ZETIA) 10 MG tablet Take 5 mg by mouth daily.    . finasteride (PROSCAR) 5 MG tablet Take 5 mg by mouth daily.     . fluticasone (FLONASE) 50 MCG/ACT nasal spray Place 2 sprays into both nostrils daily.     . folic acid (FOLVITE) 1 MG tablet Take 1 mg by mouth daily.    Marland Kitchen gabapentin (NEURONTIN) 100 MG capsule Take 200 mg by mouth 3 (three) times daily. Taking 500mg  daily    . metFORMIN (GLUCOPHAGE-XR) 500 MG 24 hr tablet Take 1,000 mg by mouth See admin instructions. Take 1000mg  every morning and take 1000 mg at supper    . potassium citrate (UROCIT-K) 10 MEQ (1080 MG) SR tablet Take 10 mEq by mouth in the morning and at bedtime.     . Tiotropium Bromide-Olodaterol (STIOLTO RESPIMAT)  2.5-2.5 MCG/ACT AERS Inhale 2 puffs into the lungs daily. 12 g 3  . traMADol (ULTRAM) 50 MG tablet Take 1 tablet (50 mg total) by mouth every 6 (six) hours as needed (mild pain). 20 tablet 0  . tamsulosin (FLOMAX) 0.4 MG CAPS capsule TAKE 2 CAPSULES BY MOUTH  DAILY (Patient not taking: Reported on 08/21/2020) 180 capsule 3   No facility-administered medications prior to visit.    Review of Systems  Constitutional: Negative for chills, diaphoresis, fever, malaise/fatigue and weight loss.  HENT: Negative for congestion.   Respiratory: Positive for shortness of breath. Negative for cough, hemoptysis, sputum production and wheezing.   Cardiovascular: Negative for  chest pain, palpitations and leg swelling.     Objective:   Vitals:   08/21/20 1040  BP: 110/70  Pulse: (!) 44  Temp: (!) 97.5 F (36.4 C)  TempSrc: Tympanic  SpO2: 96%  Weight: 177 lb (80.3 kg)  Height: 5\' 10"  (1.778 m)   SpO2: 96 %  Physical Exam: General: Elderly, well-appearing, no acute distress HENT: [place], AT Eyes: EOMI, no scleral icterus Respiratory: Clear to auscultation bilaterally.  No crackles, wheezing or rales Cardiovascular: RRR, -M/R/G, no JVD Extremities:-Edema,-tenderness Neuro: AAO x4, CNII-XII grossly intact Psych: Normal mood, normal affect  Data Reviewed:  Imaging: CT Chest Lung Screen 08/25/16 - RUL apical lung nodule measuring 1.30mm. RLL 10mm calcified granuloma. Moderate emphysema. CT Chest Lung Screen 08/24/17 - Right apical lung nodule measuring 2.51mm. RLL 1.60mm calcified granuloma. Left apical pleural-parenchymal scarring. Moderate emphysema. CT Chest 04/27/20 - S/p left upper lobe wedge resection with surgical staple line. No interval pulmonary nodules development. Emphysema.   PFT: 09/02/17 Spirometry FVC 1.6 (36%) FEV1 1.0 (31%) Ratio 63  Interpretation: Very severe obstructive defect  Labs: CBC    Component Value Date/Time   WBC 7.6 08/02/2020 1045   WBC 4.0 01/20/2020 0554   RBC 4.94 08/02/2020 1045   HGB 14.6 08/02/2020 1045   HCT 44.0 08/02/2020 1045   PLT 183 08/02/2020 1045   MCV 89.1 08/02/2020 1045   MCH 29.6 08/02/2020 1045   MCHC 33.2 08/02/2020 1045   RDW 13.2 08/02/2020 1045   LYMPHSABS 1.7 08/02/2020 1045   MONOABS 0.7 08/02/2020 1045   EOSABS 0.5 08/02/2020 1045   BASOSABS 0.1 08/02/2020 1045   BMET    Component Value Date/Time   NA 140 08/02/2020 1045   K 4.2 08/02/2020 1045   CL 103 08/02/2020 1045   CO2 29 08/02/2020 1045   GLUCOSE 114 (H) 08/02/2020 1045   BUN 21 08/02/2020 1045   CREATININE 1.21 08/02/2020 1045   CALCIUM 10.4 (H) 08/02/2020 1045   GFRNONAA >60 08/02/2020 1045   GFRAA >60 01/30/2020 0854    Imaging, labs and test noted above have been reviewed independently by me.    Assessment & Plan:   Discussion: 75 year old male with very severe COPD/emphysema who presents for follow-up. Well controlled on asthma. S/p diagnosis and resection of left upper lung adenocarcinoma. Discussed benefits of Pulmonary Rehab. After addressing questions, patient expresses interest in referral.  75 year old male active smoker with very severe COPD with emphysema and stage III lung adenocarcinoma s/p LUL resection who presents for follow-up. He is well controlled on Stiolto.  Re-discussed benefits of Pulmonary Rehab. Per phone notes, he declined which he states he was mistaken.  COPD  - well controlled, no exacerbation --CONTINUE Stioloto TWO puffs ONCE a day --Refer to Pulmonary Rehab. Will contact for referral status  Tobacco abuse  Patient is an active smoker. We discussed smoking cessation for 3 minutes. We discussed triggers and stressors and ways to deal with them. We discussed barriers to continued smoking and benefits of smoking cessation. Provided patient with information cessation techniques and interventions including [place] quitline.  StageIIIaadenocarcinoma of the left lung --Management per Oncology  Allergic Rhinitis --CONTINUE Zyrtec daily as needed --CONTINUE Flonase as directed. Consider alternating pointing to your frontal sinuses vs your maxillary sinuses --Nasal rinses as needed before administration for Flonase   Health Maintenance Immunization History  Administered Date(s) Administered  . DTaP 12/04/2015  . Pneumococcal Conjugate-13 05/04/2014, 09/02/2016  . Pneumococcal Polysaccharide-23 06/19/2015, 05/28/2020  . Tdap 12/04/2015   CT Lung Screen - not qualified  Orders Placed This Encounter  Procedures  . AMB referral to pulmonary rehabilitation    Referral Priority:   Routine    Referral Type:   Consultation    Number of Visits Requested:   1  No orders of the  defined types were placed in this encounter.   Return in about 6 months (around 02/21/2021).   I have spent a total time of 31-minutes on the day of the appointment reviewing prior documentation, coordinating care and discussing medical diagnosis and plan with the patient/family. Imaging, labs and tests included in this note have been reviewed and interpreted independently by me.  [place], MD [facility] 08/21/2020 10:54 AM  Office Number [phone]

## 2020-08-21 NOTE — Patient Instructions (Addendum)
QUIT SMOKING! Follow-up with me in 6 months    Smoking Tobacco Information, Adult Smoking tobacco can be harmful to your health. Tobacco contains a poisonous (toxic), colorless chemical called nicotine. Nicotine is addictive. It changes the brain and can make it hard to stop smoking. Tobacco also has other toxic chemicals that can hurt your body and raise your risk of many cancers. How can smoking tobacco affect me? Smoking tobacco puts you at risk for:  Cancer. Smoking is most commonly associated with lung cancer, but can also lead to cancer in other parts of the body.  Chronic obstructive pulmonary disease (COPD). This is a long-term lung condition that makes it hard to breathe. It also gets worse over time.  High blood pressure (hypertension), heart disease, stroke, or heart attack.  Lung infections, such as pneumonia.  Cataracts. This is when the lenses in the eyes become clouded.  Digestive problems. This may include peptic ulcers, heartburn, and gastroesophageal reflux disease (GERD).  Oral health problems, such as gum disease and tooth loss.  Loss of taste and smell. Smoking can affect your appearance by causing:  Wrinkles.  Yellow or stained teeth, fingers, and fingernails. Smoking tobacco can also affect your social life, because:  It may be challenging to find places to smoke when away from home. Many workplaces, [employer], hotels, and public places are tobacco-free.  Smoking is expensive. This is due to the cost of tobacco and the long-term costs of treating health problems from smoking.  Secondhand smoke may affect those around you. Secondhand smoke can cause lung cancer, breathing problems, and heart disease. Children of smokers have a higher risk for: ? Sudden infant death syndrome (SIDS). ? Ear infections. ? Lung infections. If you currently smoke tobacco, quitting now can help you:  Lead a longer and healthier life.  Look, smell, breathe, and feel better  over time.  Save money.  Protect others from the harms of secondhand smoke. What actions can I take to prevent health problems? Quit smoking  Do not start smoking. Quit if you already do.  Make a plan to quit smoking and commit to it. Look for programs to help you and ask your health care provider for recommendations and ideas.  Set a date and write down all the reasons you want to quit.  Let your friends and family know you are quitting so they can help and support you. Consider finding friends who also want to quit. It can be easier to quit with someone else, so that you can support each other.  Talk with your health care provider about using nicotine replacement medicines to help you quit, such as gum, lozenges, patches, sprays, or pills.  Do not replace cigarette smoking with electronic cigarettes, which are commonly called e-cigarettes. The safety of e-cigarettes is not known, and some may contain harmful chemicals.  If you try to quit but return to smoking, stay positive. It is common to slip up when you first quit, so take it one day at a time.  Be prepared for cravings. When you feel the urge to smoke, chew gum or suck on hard candy.   Lifestyle  Stay busy and take care of your body.  Drink enough fluid to keep your urine pale yellow.  Get plenty of exercise and eat a healthy diet. This can help prevent weight gain after quitting.  Monitor your eating habits. Quitting smoking can cause you to have a larger appetite than when you smoke.  Find ways to relax.  Go out with friends or family to a movie or a restaurant where people do not smoke.  Ask your health care provider about having regular tests (screenings) to check for cancer. This may include blood tests, imaging tests, and other tests.  Find ways to manage your stress, such as meditation, yoga, or exercise. Where to find support To get support to quit smoking, consider:  Asking your health care provider for more  information and resources.  Taking classes to learn more about quitting smoking.  Looking for local organizations that offer resources about quitting smoking.  Joining a support group for people who want to quit smoking in your local community.  Calling the smokefree.gov counselor helpline: 1-800-Quit-Now (908)093-6525) Where to find more information You may find more information about quitting smoking from:  HelpGuide.org: www.helpguide.org  https://hall.com/: smokefree.gov  American Lung Association: www.lung.org Contact a health care provider if you:  Have problems breathing.  Notice that your lips, nose, or fingers turn blue.  Have chest pain.  Are coughing up blood.  Feel faint or you pass out.  Have other health changes that cause you to worry. Summary  Smoking tobacco can negatively affect your health, the health of those around you, your finances, and your social life.  Do not start smoking. Quit if you already do. If you need help quitting, ask your health care provider.  Think about joining a support group for people who want to quit smoking in your local community. There are many effective programs that will help you to quit this behavior. This information is not intended to replace advice given to you by your health care provider. Make sure you discuss any questions you have with your health care provider. Document Revised: 03/04/2019 Document Reviewed: 06/24/2016 Elsevier Patient Education  2021 [employer].

## 2020-08-22 ENCOUNTER — Telehealth: Payer: Self-pay | Admitting: Cardiology

## 2020-08-22 NOTE — Telephone Encounter (Signed)
Crystal, RN case manager with [facility], is returning call.

## 2020-08-22 NOTE — Telephone Encounter (Signed)
Left message to call back  

## 2020-08-22 NOTE — Telephone Encounter (Signed)
STAT if HR is under 50 or over 120 (normal HR is 60-100 beats per minute)  1) What is your heart rate? Ranges 43-45  2) Do you have a log of your heart rate readings (document readings)? No   3) Do you have any other symptoms? No  Crystal, RN case manager with [facility], is requesting to discuss the patient's normal HR parameters.   Phone #: [phone] (ext: [phone])

## 2020-08-22 NOTE — Telephone Encounter (Signed)
Spoke with Crystal from [employer] who report pt has an automatic monitoring blood pressure machine and they are receiving occasional alerts of low HR reading in the 40's. She report pt is asymptomatic but was told the pt made MD aware and is ok with readings. Nurse is inquiring about parameters as when to call  and wanted to make sure MD was actually aware.

## 2020-08-26 NOTE — Telephone Encounter (Signed)
We should be notified if he is symptomatic with his low heart rate.

## 2020-08-27 NOTE — Telephone Encounter (Signed)
Left voicemail for Crystal. No parameters given, Dr. Percival Spanish should be notified if patient is symptomatic with his low heart rate.

## 2020-08-29 ENCOUNTER — Encounter: Payer: Self-pay | Admitting: Pulmonary Disease

## 2020-08-29 ENCOUNTER — Encounter (HOSPITAL_COMMUNITY): Payer: Self-pay | Admitting: *Deleted

## 2020-08-29 NOTE — Progress Notes (Signed)
Received second referral from Dr. Loanne Drilling for this pt to participate in pulmonary rehab with the the diagnosis of Centriobular Emphysema.  Pt was referred in December and was contacted.  At that time he declined to participate. Clinical review of pt follow up appt on  Pulmonary office note 3/1.  Pt with Covid Risk Score - 7. Pt appropriate for scheduling for Pulmonary rehab.  Will forward to support staff for scheduling and verification of insurance eligibility/benefits with pt consent. Cherre Huger, BSN Cardiac and Training and development officer

## 2020-08-30 ENCOUNTER — Telehealth (HOSPITAL_COMMUNITY): Payer: Self-pay | Admitting: Internal Medicine

## 2020-09-03 ENCOUNTER — Telehealth (HOSPITAL_COMMUNITY): Payer: Self-pay

## 2020-09-03 NOTE — Telephone Encounter (Signed)
Returned pt phone call in regards to pulmonary rehab, advised pt that he has been cleared and that we only have a certain amount of slots for April and that he had a few ppl ahead of him to schedule before we could schedule him and that he will receive a call once he is next. Pt understood.

## 2020-09-04 DIAGNOSIS — R338 Other retention of urine: Secondary | ICD-10-CM | POA: Diagnosis not present

## 2020-09-12 DIAGNOSIS — E11319 Type 2 diabetes mellitus with unspecified diabetic retinopathy without macular edema: Secondary | ICD-10-CM | POA: Diagnosis not present

## 2020-09-12 DIAGNOSIS — I10 Essential (primary) hypertension: Secondary | ICD-10-CM | POA: Diagnosis not present

## 2020-09-12 DIAGNOSIS — E7800 Pure hypercholesterolemia, unspecified: Secondary | ICD-10-CM | POA: Diagnosis not present

## 2020-09-17 DIAGNOSIS — C3492 Malignant neoplasm of unspecified part of left bronchus or lung: Secondary | ICD-10-CM | POA: Diagnosis not present

## 2020-09-17 DIAGNOSIS — I739 Peripheral vascular disease, unspecified: Secondary | ICD-10-CM | POA: Diagnosis not present

## 2020-09-17 DIAGNOSIS — Z0001 Encounter for general adult medical examination with abnormal findings: Secondary | ICD-10-CM | POA: Diagnosis not present

## 2020-09-17 DIAGNOSIS — I714 Abdominal aortic aneurysm, without rupture: Secondary | ICD-10-CM | POA: Diagnosis not present

## 2020-09-17 DIAGNOSIS — E1142 Type 2 diabetes mellitus with diabetic polyneuropathy: Secondary | ICD-10-CM | POA: Diagnosis not present

## 2020-09-17 DIAGNOSIS — F17200 Nicotine dependence, unspecified, uncomplicated: Secondary | ICD-10-CM | POA: Diagnosis not present

## 2020-09-17 DIAGNOSIS — Z0000 Encounter for general adult medical examination without abnormal findings: Secondary | ICD-10-CM | POA: Diagnosis not present

## 2020-09-17 DIAGNOSIS — J439 Emphysema, unspecified: Secondary | ICD-10-CM | POA: Diagnosis not present

## 2020-09-17 DIAGNOSIS — R339 Retention of urine, unspecified: Secondary | ICD-10-CM | POA: Diagnosis not present

## 2020-09-17 DIAGNOSIS — I10 Essential (primary) hypertension: Secondary | ICD-10-CM | POA: Diagnosis not present

## 2020-09-17 DIAGNOSIS — K219 Gastro-esophageal reflux disease without esophagitis: Secondary | ICD-10-CM | POA: Diagnosis not present

## 2020-09-17 DIAGNOSIS — I2510 Atherosclerotic heart disease of native coronary artery without angina pectoris: Secondary | ICD-10-CM | POA: Diagnosis not present

## 2020-09-27 DIAGNOSIS — I700 Atherosclerosis of aorta: Secondary | ICD-10-CM | POA: Diagnosis not present

## 2020-09-27 DIAGNOSIS — I711 Thoracic aortic aneurysm, ruptured: Secondary | ICD-10-CM | POA: Diagnosis not present

## 2020-10-03 ENCOUNTER — Other Ambulatory Visit: Payer: Self-pay | Admitting: *Deleted

## 2020-10-03 NOTE — Patient Outreach (Signed)
[facility]) Care Management  10/03/2020  Chad Gomez July 12, 1945 [id number]  Unsuccessful outreach attempt made to patient. Patient answered the phone and stated that he would not be able to speak today. he did request that this nurse call back at a later date.   Plan: RN Health Coach will call patient within the month of May.  [name] RN, BSN [place] [phone] [name]

## 2020-10-09 DIAGNOSIS — R338 Other retention of urine: Secondary | ICD-10-CM | POA: Diagnosis not present

## 2020-10-16 ENCOUNTER — Telehealth: Payer: Self-pay | Admitting: Internal Medicine

## 2020-10-16 NOTE — Telephone Encounter (Signed)
R/s 5/9 appt to accommodate provider schedule. Called and spoke with patient. Confirmed new date and time

## 2020-10-17 ENCOUNTER — Telehealth: Payer: Self-pay | Admitting: *Deleted

## 2020-10-17 DIAGNOSIS — C3490 Malignant neoplasm of unspecified part of unspecified bronchus or lung: Secondary | ICD-10-CM

## 2020-10-17 NOTE — Telephone Encounter (Signed)
[id number]; Called patient to confirm upcoming study visit with Dr. Julien Nordmann and to schedule CT scan prior to visit. Wife answered and confirmed appt with Julien Nordmann has been changed from 10/29/20 to 11/07/20.  This is within the protocol allowed window for this visit. Informed wife of CT to be scheduled prior to appt and she stated that 11/05/20 works for their schedule. CT scan scheduled for 11/05/20 at 12:00 pm.  Patient will need to have labs drawn prior at 11 am here at our clinic. Patient can have liquids only for 4 hours prior to CT scan (liquids only after 8 am).  Called back after scheduling CT scan and patient answered. Date and time of Lab and CT scan given to patient along with above instructions and he verbalized understanding.  Foye Spurling, BSN, RN Clinical Research Nurse 10/17/2020 4:24 PM

## 2020-10-22 ENCOUNTER — Telehealth (HOSPITAL_COMMUNITY): Payer: Self-pay

## 2020-10-22 NOTE — Telephone Encounter (Signed)
Pt insurance is active and benefits verified through [facility]. Co-pay $20.00, DED $0.00/$0.00 met, out of pocket $4,500.00/$338.84 met, co-insurance 0%. No pre-authorization required. [place] [employer], 10/22/20 @ 10:03AM, KPQ#[id number]  Will contact patient to see if he is interested in the Cardiac Rehab Program.

## 2020-10-22 NOTE — Telephone Encounter (Signed)
Called patient to see if he is interested in the Pulmonary Rehab Program. Patient expressed interest. Explained scheduling process and went over insurance, patient verbalized understanding. Also adv pt where we are with scheduling for PR and that we have a back log. (1-3 months)

## 2020-10-26 ENCOUNTER — Inpatient Hospital Stay: Payer: Medicare Other

## 2020-10-29 ENCOUNTER — Inpatient Hospital Stay: Payer: Medicare Other | Admitting: Internal Medicine

## 2020-11-02 ENCOUNTER — Telehealth: Payer: Self-pay | Admitting: *Deleted

## 2020-11-02 NOTE — Telephone Encounter (Signed)
[id number];  Notified by Debbra Riding in Radiology that CT scan scheduled for 11/05/20 at 12:00 pm was canceled due to nationwide shortage of contrast. Dr. Julien Nordmann was notified and gave okay to reschedule 4 weeks later. Schedulers were unable to speak with patient or wife to inform of new d/t of scan.  Informed Sherri this is a study required scan and it will be a protocol deviation if performed more than 2 weeks after target date of 10/26/20. It must be done prior to 11/09/20 to be within the protocol specified window. Sherri moved the scan back to the original scheduled date of 11/05/20 at 12 noon. Called patient and left VM informing that although they may have received a call that CT was rescheduled, it has been moved back to this Monday as previously scheduled. Asked patient to please return call to confirm he received this message.   Foye Spurling, BSN, RN Clinical Research Nurse 11/02/2020 11:25 AM

## 2020-11-02 NOTE — Telephone Encounter (Signed)
[id number]; Spoke with patient and he confirmed his lab and CT scan on Monday 11/05/20 starting at 11 am for Lab and 12 pm for CT. Reminded patient clear liquids only for 4 hrs prior to CT.  Okay to take meds with water.  Patient verbalized understanding.  Foye Spurling, BSN, RN Clinical Research Nurse 11/02/2020 3:19 PM

## 2020-11-05 ENCOUNTER — Ambulatory Visit (HOSPITAL_COMMUNITY)
Admission: RE | Admit: 2020-11-05 | Discharge: 2020-11-05 | Disposition: A | Discharge status: Home / Self Care | Payer: Medicare Other | Source: Ambulatory Visit | Attending: Internal Medicine | Admitting: Internal Medicine

## 2020-11-05 ENCOUNTER — Encounter (HOSPITAL_COMMUNITY): Payer: Self-pay

## 2020-11-05 ENCOUNTER — Inpatient Hospital Stay: Payer: Medicare Other | Attending: Physician Assistant

## 2020-11-05 ENCOUNTER — Other Ambulatory Visit: Payer: Self-pay

## 2020-11-05 ENCOUNTER — Ambulatory Visit (HOSPITAL_COMMUNITY): Payer: Medicare Other

## 2020-11-05 DIAGNOSIS — Z7982 Long term (current) use of aspirin: Secondary | ICD-10-CM | POA: Diagnosis not present

## 2020-11-05 DIAGNOSIS — I10 Essential (primary) hypertension: Secondary | ICD-10-CM | POA: Insufficient documentation

## 2020-11-05 DIAGNOSIS — Z79899 Other long term (current) drug therapy: Secondary | ICD-10-CM | POA: Insufficient documentation

## 2020-11-05 DIAGNOSIS — Z7984 Long term (current) use of oral hypoglycemic drugs: Secondary | ICD-10-CM | POA: Insufficient documentation

## 2020-11-05 DIAGNOSIS — Z85118 Personal history of other malignant neoplasm of bronchus and lung: Secondary | ICD-10-CM | POA: Diagnosis not present

## 2020-11-05 DIAGNOSIS — J929 Pleural plaque without asbestos: Secondary | ICD-10-CM | POA: Diagnosis not present

## 2020-11-05 DIAGNOSIS — C3490 Malignant neoplasm of unspecified part of unspecified bronchus or lung: Secondary | ICD-10-CM

## 2020-11-05 DIAGNOSIS — E119 Type 2 diabetes mellitus without complications: Secondary | ICD-10-CM | POA: Diagnosis not present

## 2020-11-05 DIAGNOSIS — I2510 Atherosclerotic heart disease of native coronary artery without angina pectoris: Secondary | ICD-10-CM | POA: Diagnosis not present

## 2020-11-05 DIAGNOSIS — C3412 Malignant neoplasm of upper lobe, left bronchus or lung: Secondary | ICD-10-CM | POA: Insufficient documentation

## 2020-11-05 DIAGNOSIS — J432 Centrilobular emphysema: Secondary | ICD-10-CM | POA: Diagnosis not present

## 2020-11-05 DIAGNOSIS — C771 Secondary and unspecified malignant neoplasm of intrathoracic lymph nodes: Secondary | ICD-10-CM | POA: Diagnosis not present

## 2020-11-05 DIAGNOSIS — Z006 Encounter for examination for normal comparison and control in clinical research program: Secondary | ICD-10-CM | POA: Insufficient documentation

## 2020-11-05 LAB — CBC WITH DIFFERENTIAL (CANCER CENTER ONLY)
Abs Immature Granulocytes: 0.01 10*3/uL (ref 0.00–0.07)
Basophils Absolute: 0.1 10*3/uL (ref 0.0–0.1)
Basophils Relative: 1 %
Eosinophils Absolute: 0.5 10*3/uL (ref 0.0–0.5)
Eosinophils Relative: 6 %
HCT: 45.1 % (ref 39.0–52.0)
Hemoglobin: 15.1 g/dL (ref 13.0–17.0)
Immature Granulocytes: 0 %
Lymphocytes Relative: 23 %
Lymphs Abs: 1.9 10*3/uL (ref 0.7–4.0)
MCH: 29.5 pg (ref 26.0–34.0)
MCHC: 33.5 g/dL (ref 30.0–36.0)
MCV: 88.1 fL (ref 80.0–100.0)
Monocytes Absolute: 0.8 10*3/uL (ref 0.1–1.0)
Monocytes Relative: 10 %
Neutro Abs: 5.3 10*3/uL (ref 1.7–7.7)
Neutrophils Relative %: 60 %
Platelet Count: 201 10*3/uL (ref 150–400)
RBC: 5.12 MIL/uL (ref 4.22–5.81)
RDW: 13.2 % (ref 11.5–15.5)
WBC Count: 8.6 10*3/uL (ref 4.0–10.5)
nRBC: 0 % (ref 0.0–0.2)

## 2020-11-05 LAB — CMP (CANCER CENTER ONLY)
ALT: 12 U/L (ref 0–44)
AST: 17 U/L (ref 15–41)
Albumin: 4.1 g/dL (ref 3.5–5.0)
Alkaline Phosphatase: 79 U/L (ref 38–126)
Anion gap: 7 (ref 5–15)
BUN: 14 mg/dL (ref 8–23)
CO2: 31 mmol/L (ref 22–32)
Calcium: 10.5 mg/dL — ABNORMAL HIGH (ref 8.9–10.3)
Chloride: 99 mmol/L (ref 98–111)
Creatinine: 1.16 mg/dL (ref 0.61–1.24)
GFR, Estimated: >60 mL/min (ref >60)
Glucose, Bld: 101 mg/dL — ABNORMAL HIGH (ref 70–99)
Potassium: 3.8 mmol/L (ref 3.5–5.1)
Sodium: 137 mmol/L (ref 135–145)
Total Bilirubin: 0.6 mg/dL (ref 0.3–1.2)
Total Protein: 6.6 g/dL (ref 6.5–8.1)

## 2020-11-05 LAB — MAGNESIUM: Magnesium: 1.4 mg/dL — ABNORMAL LOW (ref 1.7–2.4)

## 2020-11-05 MED ORDER — IOHEXOL 300 MG/ML  SOLN
75.0000 mL | Freq: Once | INTRAMUSCULAR | Status: AC | PRN
  Administered 2020-11-05: 75 mL via INTRAVENOUS

## 2020-11-05 MED ORDER — SODIUM CHLORIDE (PF) 0.9 % IJ SOLN
INTRAMUSCULAR | Status: AC
  Filled 2020-11-05: qty 50

## 2020-11-06 DIAGNOSIS — R339 Retention of urine, unspecified: Secondary | ICD-10-CM | POA: Diagnosis not present

## 2020-11-07 ENCOUNTER — Encounter: Payer: Self-pay | Admitting: *Deleted

## 2020-11-07 ENCOUNTER — Inpatient Hospital Stay: Payer: Medicare Other | Admitting: Internal Medicine

## 2020-11-07 ENCOUNTER — Encounter: Payer: Self-pay | Admitting: Internal Medicine

## 2020-11-07 ENCOUNTER — Other Ambulatory Visit: Payer: Self-pay

## 2020-11-07 VITALS — BP 152/90 | HR 91 | Temp 98.0°F | Resp 19 | Ht 70.0 in | Wt 177.2 lb

## 2020-11-07 DIAGNOSIS — Z006 Encounter for examination for normal comparison and control in clinical research program: Secondary | ICD-10-CM

## 2020-11-07 DIAGNOSIS — C3412 Malignant neoplasm of upper lobe, left bronchus or lung: Secondary | ICD-10-CM | POA: Diagnosis present

## 2020-11-07 DIAGNOSIS — C771 Secondary and unspecified malignant neoplasm of intrathoracic lymph nodes: Secondary | ICD-10-CM | POA: Diagnosis present

## 2020-11-07 DIAGNOSIS — C3490 Malignant neoplasm of unspecified part of unspecified bronchus or lung: Secondary | ICD-10-CM

## 2020-11-07 DIAGNOSIS — C3492 Malignant neoplasm of unspecified part of left bronchus or lung: Secondary | ICD-10-CM | POA: Diagnosis not present

## 2020-11-07 DIAGNOSIS — C3410 Malignant neoplasm of upper lobe, unspecified bronchus or lung: Secondary | ICD-10-CM

## 2020-11-07 NOTE — Patient Instructions (Signed)
Steps to Quit Smoking Smoking tobacco is the leading cause of preventable death. It can affect almost every organ in the body. Smoking puts you and people around you at risk for many serious, long-lasting (chronic) diseases. Quitting smoking can be hard, but it is one of the best things that you can do for your health. It is never too late to quit. How do I get ready to quit? When you decide to quit smoking, make a plan to help you succeed. Before you quit:  Pick a date to quit. Set a date within the next 2 weeks to give you time to prepare.  Write down the reasons why you are quitting. Keep this list in places where you will see it often.  Tell your family, friends, and co-workers that you are quitting. Their support is important.  Talk with your doctor about the choices that may help you quit.  Find out if your health insurance will pay for these treatments.  Know the people, places, things, and activities that make you want to smoke (triggers). Avoid them. What first steps can I take to quit smoking?  Throw away all cigarettes at home, at work, and in your car.  Throw away the things that you use when you smoke, such as ashtrays and lighters.  Clean your car. Make sure to empty the ashtray.  Clean your home, including curtains and carpets. What can I do to help me quit smoking? Talk with your doctor about taking medicines and seeing a counselor at the same time. You are more likely to succeed when you do both.  If you are pregnant or breastfeeding, talk with your doctor about counseling or other ways to quit smoking. Do not take medicine to help you quit smoking unless your doctor tells you to do so. To quit smoking: Quit right away  Quit smoking totally, instead of slowly cutting back on how much you smoke over a period of time.  Go to counseling. You are more likely to quit if you go to counseling sessions regularly. Take medicine You may take medicines to help you quit. Some  medicines need a prescription, and some you can buy over-the-counter. Some medicines may contain a drug called nicotine to replace the nicotine in cigarettes. Medicines may:  Help you to stop having the desire to smoke (cravings).  Help to stop the problems that come when you stop smoking (withdrawal symptoms). Your doctor may ask you to use:  Nicotine patches, gum, or lozenges.  Nicotine inhalers or sprays.  Non-nicotine medicine that is taken by mouth. Find resources Find resources and other ways to help you quit smoking and remain smoke-free after you quit. These resources are most helpful when you use them often. They include:  Online chats with a counselor.  Phone quitlines.  Printed self-help materials.  Support groups or group counseling.  Text messaging programs.  Mobile phone apps. Use apps on your mobile phone or tablet that can help you stick to your quit plan. There are many free apps for mobile phones and tablets as well as websites. Examples include Quit Guide from the CDC and smokefree.gov   What things can I do to make it easier to quit?  Talk to your family and friends. Ask them to support and encourage you.  Call a phone quitline (1-800-QUIT-NOW), reach out to support groups, or work with a counselor.  Ask people who smoke to not smoke around you.  Avoid places that make you want to smoke,   such as: ? Bars. ? Parties. ? Smoke-break areas at work.  Spend time with people who do not smoke.  Lower the stress in your life. Stress can make you want to smoke. Try these things to help your stress: ? Getting regular exercise. ? Doing deep-breathing exercises. ? Doing yoga. ? Meditating. ? Doing a body scan. To do this, close your eyes, focus on one area of your body at a time from head to toe. Notice which parts of your body are tense. Try to relax the muscles in those areas.   How will I feel when I quit smoking? Day 1 to 3 weeks Within the first 24 hours,  you may start to have some problems that come from quitting tobacco. These problems are very bad 2-3 days after you quit, but they do not often last for more than 2-3 weeks. You may get these symptoms:  Mood swings.  Feeling restless, nervous, angry, or annoyed.  Trouble concentrating.  Dizziness.  Strong desire for high-sugar foods and nicotine.  Weight gain.  Trouble pooping (constipation).  Feeling like you may vomit (nausea).  Coughing or a sore throat.  Changes in how the medicines that you take for other issues work in your body.  Depression.  Trouble sleeping (insomnia). Week 3 and afterward After the first 2-3 weeks of quitting, you may start to notice more positive results, such as:  Better sense of smell and taste.  Less coughing and sore throat.  Slower heart rate.  Lower blood pressure.  Clearer skin.  Better breathing.  Fewer sick days. Quitting smoking can be hard. Do not give up if you fail the first time. Some people need to try a few times before they succeed. Do your best to stick to your quit plan, and talk with your doctor if you have any questions or concerns. Summary  Smoking tobacco is the leading cause of preventable death. Quitting smoking can be hard, but it is one of the best things that you can do for your health.  When you decide to quit smoking, make a plan to help you succeed.  Quit smoking right away, not slowly over a period of time.  When you start quitting, seek help from your doctor, family, or friends. This information is not intended to replace advice given to you by your health care provider. Make sure you discuss any questions you have with your health care provider. Document Revised: 03/04/2019 Document Reviewed: 08/28/2018 Elsevier Patient Education  2021 Elsevier Inc.  

## 2020-11-07 NOTE — Research (Signed)
[id number] Follow Up Visit: Patient into clinic with his wife today for every 3 month follow up visit on study.  PROs; Given to patient to complete in lobby while waiting for registration and MD visit. Collected and checked for completeness and accuracy.  Labs; Standard of Care labs collected on 11/05/20 per Dr. Julien Nordmann. CT Scan of Chest; Completed on 11/05/20 per protocol and images uploaded to [facility] per protocol.   H & P; Patient seen by Dr. Julien Nordmann today.  See MD encounter dated today for details of visit.  AE Review: Patient denies any serious adverse events since last study visit.  Plan;  Informed patient and Dr. Julien Nordmann of next study visit due on 01/28/2021. Patient verbalized understanding and agreement to this plan.  Thanked patient for his ongoing participation and support of this study.  Encouraged him to call if any questions or concerns prior to next visit. He verbalized understanding.  Foye Spurling, BSN, RN Clinical Research Nurse 11/07/2020 1:39 PM

## 2020-11-07 NOTE — Progress Notes (Signed)
[place] [phone]   Fax:(336) [place], [place] [address]  DIAGNOSIS: stageIIIA(T2a, N2,M0)non-small cell lung cancer, adenocarcinoma. Patient presented with a left upper lobe nodule.He was diagnosed in March 2021  Biomarkers/PDL1:  Detected EGFR mutation in exon 21 ? Positive for the EGFR P848L mutation in exon 21. No mutation was detected in exons 12, 18, 19, and 20. The clinical significance of P848L in exon 21 is uncertain. This variant has been reported to behave like functionally silent polymorphism. [name], M.M., et al. Mol Cancer 6, 56 (2007). ? PD-L1 Expression Level: TPS < 1% PD-L1 Expression Status: No Expression NEGATIVE FOR ALK REARRANGEMENT  PRIOR THERAPY: 1) Robotic assisted wedge resection of the left upper lobe under the care of Dr. Servando Snare. This was performed on 10/28/2019. 2) Adjuvant systemic chemotherapy on the [facility] A [id number] protocol with cisplatin 70 mg/M2, pemetrexed 500 mg/M2 every 3 weeks in addition to pembrolizumab 400 mg IV every 6 weeks. Start date December 19, 2019.  Status post 2 cycles.  This was discontinued secondary to intolerance.  CURRENT THERAPY:  Observation.  INTERVAL HISTORY: Chad Gomez 75 y.o. male returns to the clinic today for follow-up visit accompanied by his wife.  The patient is feeling fine today with no concerning complaints except for mild fatigue.  He also has very obvious abdominal hernia.  He denied having any current chest pain, shortness of breath, cough or hemoptysis.  He denied having any fever or chills.  He has no nausea, vomiting, diarrhea or constipation.  He has no headache or visual changes.  He is here today for evaluation with repeat CT scan of the chest for restaging of his disease.   MEDICAL HISTORY: Past Medical History:  Diagnosis Date  . BPH (benign prostatic hypertrophy)    weak  stream  . Chronic kidney disease   . Complication of anesthesia    urinary retention  . COPD (chronic obstructive pulmonary disease) ([place])   . Diabetes mellitus without complication ([place])    Type II  . History of bladder stone   . History of colon polyps    08/2013   pre-canerous  . History of kidney stones   . History of melanoma excision    LEFT EAR  . Hyperlipidemia   . Hypertension   . Iron deficiency   . Lung cancer, lingula ([place]) 11/01/2019  . Open wound    left lower jaw; followed by ENT  . Peripheral vascular disease ([place])   . Right ureteral stone   . Shingles 05-15-2015    ALLERGIES:  is allergic to morphine and related, tape, amoxicillin, and naproxen.  MEDICATIONS:  Current Outpatient Medications  Medication Sig Dispense Refill  . acetaminophen (TYLENOL) 500 MG tablet Take 2 tablets (1,000 mg total) by mouth every 6 (six) hours as needed for mild pain. 30 tablet 0  . albuterol (VENTOLIN HFA) 108 (90 Base) MCG/ACT inhaler Inhale 2 puffs into the lungs every 6 (six) hours as needed for wheezing or shortness of breath. 8 g 3  . aspirin EC 81 MG tablet Take 81 mg by mouth daily.    Marland Kitchen atorvastatin (LIPITOR) 40 MG tablet Take 40 mg by mouth daily.    . Cholecalciferol (VITAMIN D-3) 1000 units CAPS Take 1,000 Units by mouth daily.     Marland Kitchen ezetimibe (ZETIA) 10 MG tablet Take 5 mg by mouth daily.    Marland Kitchen  finasteride (PROSCAR) 5 MG tablet Take 5 mg by mouth daily.     . fluticasone (FLONASE) 50 MCG/ACT nasal spray Place 2 sprays into both nostrils daily.     . folic acid (FOLVITE) 1 MG tablet Take 1 mg by mouth daily.    Marland Kitchen gabapentin (NEURONTIN) 100 MG capsule Take 200 mg by mouth 3 (three) times daily. Taking 529m daily    . metFORMIN (GLUCOPHAGE-XR) 500 MG 24 hr tablet Take 1,000 mg by mouth See admin instructions. Take 10096mevery morning and take 1000 mg at supper    . potassium citrate (UROCIT-K) 10 MEQ (1080 MG) SR tablet Take 10 mEq by mouth in the morning and at bedtime.      . Tiotropium Bromide-Olodaterol (STIOLTO RESPIMAT) 2.5-2.5 MCG/ACT AERS Inhale 2 puffs into the lungs daily. 12 g 3  . traMADol (ULTRAM) 50 MG tablet Take 1 tablet (50 mg total) by mouth every 6 (six) hours as needed (mild pain). 20 tablet 0   No current facility-administered medications for this visit.    SURGICAL HISTORY:  Past Surgical History:  Procedure Laterality Date  . ABDOMINAL AORTAGRAM  11/01/2014   Procedure: Abdominal Aortagram;  Surgeon: VaSerafina MitchellMD;  [facility];  Service: Cardiovascular;;  . AORTA - BILATERAL FEMORAL ARTERY BYPASS GRAFT N/A 12/28/2014   Procedure: AORTOBIFEMORAL BYPASS GRAFT;  Surgeon: VaSerafina MitchellMD;  [facility] Service: Vascular;  Laterality: N/A;  . BRONCHIAL BRUSHINGS  09/06/2019   Procedure: BRONCHIAL BRUSHINGS;  Surgeon: ByCollene GobbleMD;  [facility];  Service: Pulmonary;;  left upper lobe nodule  . BRONCHIAL WASHINGS  09/06/2019   Procedure: BRONCHIAL WASHINGS;  Surgeon: ByCollene GobbleMD;  [facility];  Service: Pulmonary;;  . COLONOSCOPY W/ POLYPECTOMY  09-13-2013  . CYSTOLITHALOPAXY OF BLADDER STONE/ TRANSRECTAL ULTRASOUND PROSTATE BX  08-19-2005  . CYSTOSCOPY W/ RETROGRADES Right 12/02/2012   Procedure: CYSTOSCOPY WITH RETROGRADE PYELOGRAM;  Surgeon: JoMalka SoMD;  [facility] Service: Urology;  Laterality: Right;  . CYSTOSCOPY W/ RETROGRADES Bilateral 10/13/2013   Procedure: CYSTOSCOPY WITH RETROGRADE PYELOGRAM;  Surgeon: JoIrine SealMD;  [facility] Service: Urology;  Laterality: Bilateral;  . CYSTOSCOPY W/ URETERAL STENT PLACEMENT Left 04/11/2014   Procedure: CYSTOSCOPY WITH STENT REPLACEMENT;  Surgeon: JoMalka SoMD;  [facility] Service: [facility];  Laterality: Left;  . CYSTOSCOPY WITH LITHOLAPAXY Right 12/02/2012   Procedure: CYSTOSCOPY WITH stone extraction;  Surgeon: JoMalka SoMD;  [facility] Service: U[facility];  Laterality: Right;  . CYSTOSCOPY WITH LITHOLAPAXY Right 10/13/2013   Procedure: CYSTOSCOPY WITH LITHOLAPAXY, removal of urethral stone with basket;  Surgeon: JoIrine SealMD;  [facility];  Laterality: Right;  . CYSTOSCOPY WITH RETROGRADE PYELOGRAM, URETEROSCOPY AND STENT PLACEMENT Left 03/21/2014   Procedure: CYSTOSCOPY WITH RETROGRADE PYELOGRAM, AND LEFT STENT PLACEMENT;  Surgeon: ThAlexis FrockMD;  [facility];  Service: Urology;  Laterality: Left;  . CYSTOSCOPY WITH RETROGRADE PYELOGRAM, URETEROSCOPY AND STENT PLACEMENT  09/09/2016   Procedure: CYSTOSCOPY WITH RIGHT  RETROGRADE PYELOGRAM, URETEROSCOPY WITH LASER BASKET EXTRACTION OF STONES  AND STENT PLACEMENT;  Surgeon: JoIrine SealMD;  [facility] Service: U[facility];;  . CYConsuela MimesITH URETEROSCOPY Left 04/11/2014   Procedure: CYSTOSCOPY WITH URETEROSCOPY/ STONE EXTRACTION;  Surgeon: JoMalka SoMD;  [facility]  Service: Urology;  Laterality: Left;  . FINE NEEDLE ASPIRATION  09/06/2019   Procedure: FINE NEEDLE ASPIRATION;  Surgeon: Collene Gobble, MD;  Location: [facility];  Service: Pulmonary;;  left upper lobe - lung  . HOLMIUM LASER APPLICATION Right 5/46/5681   Procedure: HOLMIUM LASER APPLICATION;  Surgeon: Malka So, MD;  Location: [facility];  Service: Urology;  Laterality: Right;  . HOLMIUM LASER APPLICATION Left 27/51/7001   Procedure: HOLMIUM LASER APPLICATION;  Surgeon: Malka So, MD;  Location: [facility];  Service: Urology;  Laterality: Left;  . HOLMIUM LASER APPLICATION Right 7/49/4496   Procedure: HOLMIUM LASER APPLICATION;  Surgeon: Irine Seal, MD;  Location: [facility];  Service: Urology;  Laterality: Right;  . INGUINAL HERNIA REPAIR Right 1954  . INTERCOSTAL NERVE BLOCK Left 10/28/2019   Procedure: Intercostal Nerve Block;   Surgeon: [name], MD;  Location: [place];  Service: Thoracic;  Laterality: Left;  . LITHOTRIPSY  2007 & 02/2013  . LUNG BIOPSY  09/06/2019   Procedure: LUNG BIOPSY;  Surgeon: Collene Gobble, MD;  Location[facility]COPY;  Service: Pulmonary;;  left upper lob lung  . [place]  . MICROLARYNGOSCOPY Right 02/18/2016   Procedure: MICRO DIRECT LARYNGOSCOPY EXCISIONAL BIOPSY OF RIGHT VOCAL CORD MASS;  Surgeon: Leta Baptist, MD;  Location: [place];  Service: ENT;  Laterality: Right;  . [place]   left ear  . MULTIPLE TOOTH EXTRACTIONS  1994  . NEPHROLITHOTOMY Left 01/27/2013   Procedure: NEPHROLITHOTOMY PERCUTANEOUS FIRST LOOK;  Surgeon: Malka So, MD;  [facility];  Service: Urology;  Laterality: Left;  . NODE DISSECTION  10/28/2019   Procedure: Node Dissection;  Surgeon: [name], MD;  Location: [facility];  Service: Thoracic;;  . PERIPHERAL VASCULAR CATHETERIZATION N/A 11/01/2014   Procedure: Lower Extremity Angiography;  Surgeon: Serafina Mitchell, MD;  Location: [facility] CV LAB;  Service: Cardiovascular;  Laterality: N/A;  . URETEROSCOPY Right 12/02/2012   Procedure: RIGHT URETEROSCOPY STONE EXTRACTION WITH  STENT PLACEMENT;  Surgeon: Malka So, MD;  Location[facility];  Service: Urology;  Laterality: Right;  Marland Kitchen VIDEO BRONCHOSCOPY  10/28/2019   VIDEO BRONCHOSCOPY WITH ENDOBRONCHIAL NAVIGATION WITH LUNG MARKINGS (N/A  . VIDEO BRONCHOSCOPY WITH ENDOBRONCHIAL NAVIGATION N/A 09/06/2019   Procedure: VIDEO BRONCHOSCOPY WITH ENDOBRONCHIAL NAVIGATION;  Surgeon: Collene Gobble, MD;  Location: [place] ENDOSCOPY;  Service: Pulmonary;  Laterality: N/A;  . VIDEO BRONCHOSCOPY WITH ENDOBRONCHIAL NAVIGATION N/A 10/28/2019   Procedure: VIDEO BRONCHOSCOPY WITH ENDOBRONCHIAL NAVIGATION WITH LUNG MARKINGS;  Surgeon: [name], MD;  Location: [place];  Service: Thoracic;  Laterality: N/A;  . WEDGE RESECTION  10/28/2019   LEFT UPPER LOBE     REVIEW OF SYSTEMS:  A comprehensive review of systems was negative.   PHYSICAL EXAMINATION: General appearance: alert, cooperative and no distress Head: Normocephalic, without obvious abnormality, atraumatic Neck: no adenopathy, no JVD, supple, symmetrical, trachea midline and thyroid not enlarged, symmetric, no tenderness/mass/nodules Lymph nodes: Cervical, supraclavicular, and axillary nodes normal. Resp: clear to auscultation bilaterally Back: symmetric, no curvature. ROM normal. No CVA tenderness. Cardio: regular rate and rhythm, S1, S2 normal, no murmur, click, rub or gallop GI: Large abdominal hernia Extremities: extremities normal, atraumatic, no cyanosis or edema  ECOG PERFORMANCE STATUS: 1 - Symptomatic but completely ambulatory  Blood pressure (!) 152/90, pulse 91, temperature 98 F (36.7 C), temperature source Tympanic, resp. rate 19, height  5' 10" (1.778 m), weight 177 lb 3.2 oz (80.4 kg), SpO2 93 %.  LABORATORY DATA: Lab Results  Component Value Date   WBC 8.6 11/05/2020   HGB 15.1 11/05/2020   HCT 45.1 11/05/2020   MCV 88.1 11/05/2020   PLT 201 11/05/2020      Chemistry      Component Value Date/Time   NA 137 11/05/2020 1056   K 3.8 11/05/2020 1056   CL 99 11/05/2020 1056   CO2 31 11/05/2020 1056   BUN 14 11/05/2020 1056   CREATININE 1.16 11/05/2020 1056      Component Value Date/Time   CALCIUM 10.5 (H) 11/05/2020 1056   ALKPHOS 79 11/05/2020 1056   AST 17 11/05/2020 1056   ALT 12 11/05/2020 1056   BILITOT 0.6 11/05/2020 1056       RADIOGRAPHIC STUDIES: CT Chest W Contrast  Result Date: 11/06/2020 CLINICAL DATA:  75 year old male with history of non-small cell lung cancer. EXAM: CT CHEST WITH CONTRAST TECHNIQUE: Multidetector CT imaging of the chest was performed during intravenous contrast administration. CONTRAST:  58m OMNIPAQUE IOHEXOL 300 MG/ML  SOLN COMPARISON:  None. FINDINGS: RECIST 1.1 Target Lesions: 1. Nodular area of architectural  distortion adjacent to the suture line in the left upper lobe (axial image 64 of series 7), stable compared to the prior study, currently measuring 2.0 x 2.4 cm (previously 1.8 x 2.4 cm). Non-target Lesions: 1. 1 cm nodule in the lateral limb of the left adrenal gland (axial image 155 of series 2), stable compared to prior studies. Cardiovascular: Heart size is normal. There is no significant pericardial fluid, thickening or pericardial calcification. There is aortic atherosclerosis, as well as atherosclerosis of the great vessels of the mediastinum and the coronary arteries, including calcified atherosclerotic plaque in the left main, left anterior descending, left circumflex and right coronary arteries. Mediastinum/Nodes: No pathologically enlarged mediastinal or hilar lymph nodes. Esophagus is unremarkable in appearance. No axillary lymphadenopathy. Lungs/Pleura: Nodular area of architectural distortion adjacent to the suture line in the left upper lobe (axial image 64 of series 7), stable compared to the prior study, currently measuring 2.0 x 2.4 cm (previously 1.8 x 2.4 cm). New 3 mm subpleural nodule in the anterior aspect of the left upper lobe (axial image 87 of series 7), nonspecific. No other larger more suspicious appearing pulmonary nodules or masses are noted. No acute consolidative airspace disease. No pleural effusions. Diffuse bronchial wall thickening with moderate centrilobular and paraseptal emphysema. Upper Abdomen: 1 cm nodule in the lateral limb of the left adrenal gland (axial image 155 of series 2), stable compared to prior studies, likely an adrenal adenoma or other benign lesion. Severe atrophy of the right kidney. Calcified gallstones in the gallbladder. No findings to suggest an acute cholecystitis at this time. Subcentimeter low-attenuation lesion in segments 6 and 8 of the liver, too small to characterize, but study similar to prior studies and statistically likely to represent small  cysts. Aortic atherosclerosis. Musculoskeletal: There are no aggressive appearing lytic or blastic lesions noted in the visualized portions of the skeleton. IMPRESSION: 1. Stable postoperative findings in the left upper lobe with what is presumed to be postoperative scarring adjacent to the suture line. Given the stability, residual or recurrent disease is not strongly favored at this time, but close attention on follow-up imaging is recommended to ensure continued stability. 2. Stable small 1 cm left adrenal nodule, likely to represent a benign lesions such as an adenoma. 3. Aortic atherosclerosis, in addition to  left main and 3 vessel coronary artery disease. Assessment for potential risk factor modification, dietary therapy or pharmacologic therapy may be warranted, if clinically indicated. 4. Diffuse bronchial wall thickening with moderate centrilobular and paraseptal emphysema; imaging findings suggestive of underlying COPD. 5. Cholelithiasis. Aortic Atherosclerosis (ICD10-I70.0) and Emphysema (ICD10-J43.9). Electronically Signed   By: Vinnie Langton M.D.   On: 11/06/2020 11:13    ASSESSMENT AND PLAN: This is a very pleasant 75 years old white male recently diagnosed with stage IIIa (T2 a, N2, M0) non-small cell lung cancer, adenocarcinoma presented with left upper lobe lung nodule and after resection the patient was found to have metastatic disease to the mediastinal lymphadenopathy.  This was diagnosed in March 2021.   He is currently undergoing adjuvant systemic chemotherapy with cisplatin, pemetrexed and pembrolizumab status post 2 cycles. His treatment was discontinued secondary to significant intolerability to this treatment with frequent admission to the hospital with enteritis, acute renal failure as well as pancytopenia. The patient is currently on observation and he is feeling fine today with no concerning complaints. He had repeat CT scan of the chest performed recently.  I personally and  independently reviewed the scan and discussed the results with the patient and his wife. His scan showed no clear evidence for disease recurrence or metastasis. I recommended for the patient to continue on observation with repeat CT scan of the chest in 3 months. For the hypomagnesemia, he was advised to resume his treatment with magnesium oxide 1 tablet once or twice daily. The patient was advised to call immediately if he has any other concerning symptoms in the interval. The patient voices understanding of current disease status and treatment options and is in agreement with the current care plan. All questions were answered. The patient knows to call the clinic with any problems, questions or concerns. We can certainly see the patient much sooner if necessary.  Disclaimer: This note was dictated with voice recognition software. Similar sounding words can inadvertently be transcribed and may not be corrected upon review.

## 2020-11-08 ENCOUNTER — Other Ambulatory Visit: Payer: Self-pay | Admitting: *Deleted

## 2020-11-08 ENCOUNTER — Telehealth (HOSPITAL_COMMUNITY): Payer: Self-pay

## 2020-11-08 NOTE — Telephone Encounter (Signed)
Called and spoke with pt wife Stanton Kidney who stated pt was driving and would call back at a later time.

## 2020-11-08 NOTE — Patient Outreach (Signed)
[facility]) Care Management  11/08/2020  Chad Gomez April 08, 1946 [id number]  Unsuccessful outreach attempt made to patient. Patient's wife answered the phone and stated that the patient was not available to speak. RN Health Coach left HIPAA compliant  message with the patient's wife and stated that she would call the patient back at a later date.   Plan: RN Health Coach will call patient within the month of June.  [name] RN, BSN [place] [phone] [name]

## 2020-11-09 ENCOUNTER — Telehealth (HOSPITAL_COMMUNITY): Payer: Self-pay

## 2020-11-09 ENCOUNTER — Telehealth: Payer: Self-pay | Admitting: Internal Medicine

## 2020-11-09 NOTE — Telephone Encounter (Signed)
Scheduled appt per 5/19 sch msg. Pt aware.

## 2020-11-09 NOTE — Telephone Encounter (Signed)
Called patient to see if he was interested in participating in the Pulmonary Rehab Program. Patient stated yes. Patient will come in for orientation on 11/26/20 @ 11AM and will attend the 1:15PM exercise class.  Tourist information centre manager.

## 2020-11-23 ENCOUNTER — Telehealth (HOSPITAL_COMMUNITY): Payer: Self-pay

## 2020-11-23 NOTE — Telephone Encounter (Signed)
Called and spoke with pt and confirm his appt Monday 11/26/20 @ 11 AM for PR orientation.

## 2020-11-26 ENCOUNTER — Ambulatory Visit (HOSPITAL_COMMUNITY): Payer: Medicare Other

## 2020-11-30 IMAGING — CR DG CHEST 2V
2 series · 2 of 2 positions shown · non-contrast
Comparison: 11/10/2019, 11/05/2019, 10/28/2019

CLINICAL DATA: History of lung cancer short of breath

EXAM:
CHEST - 2 VIEW

[w chest pa]
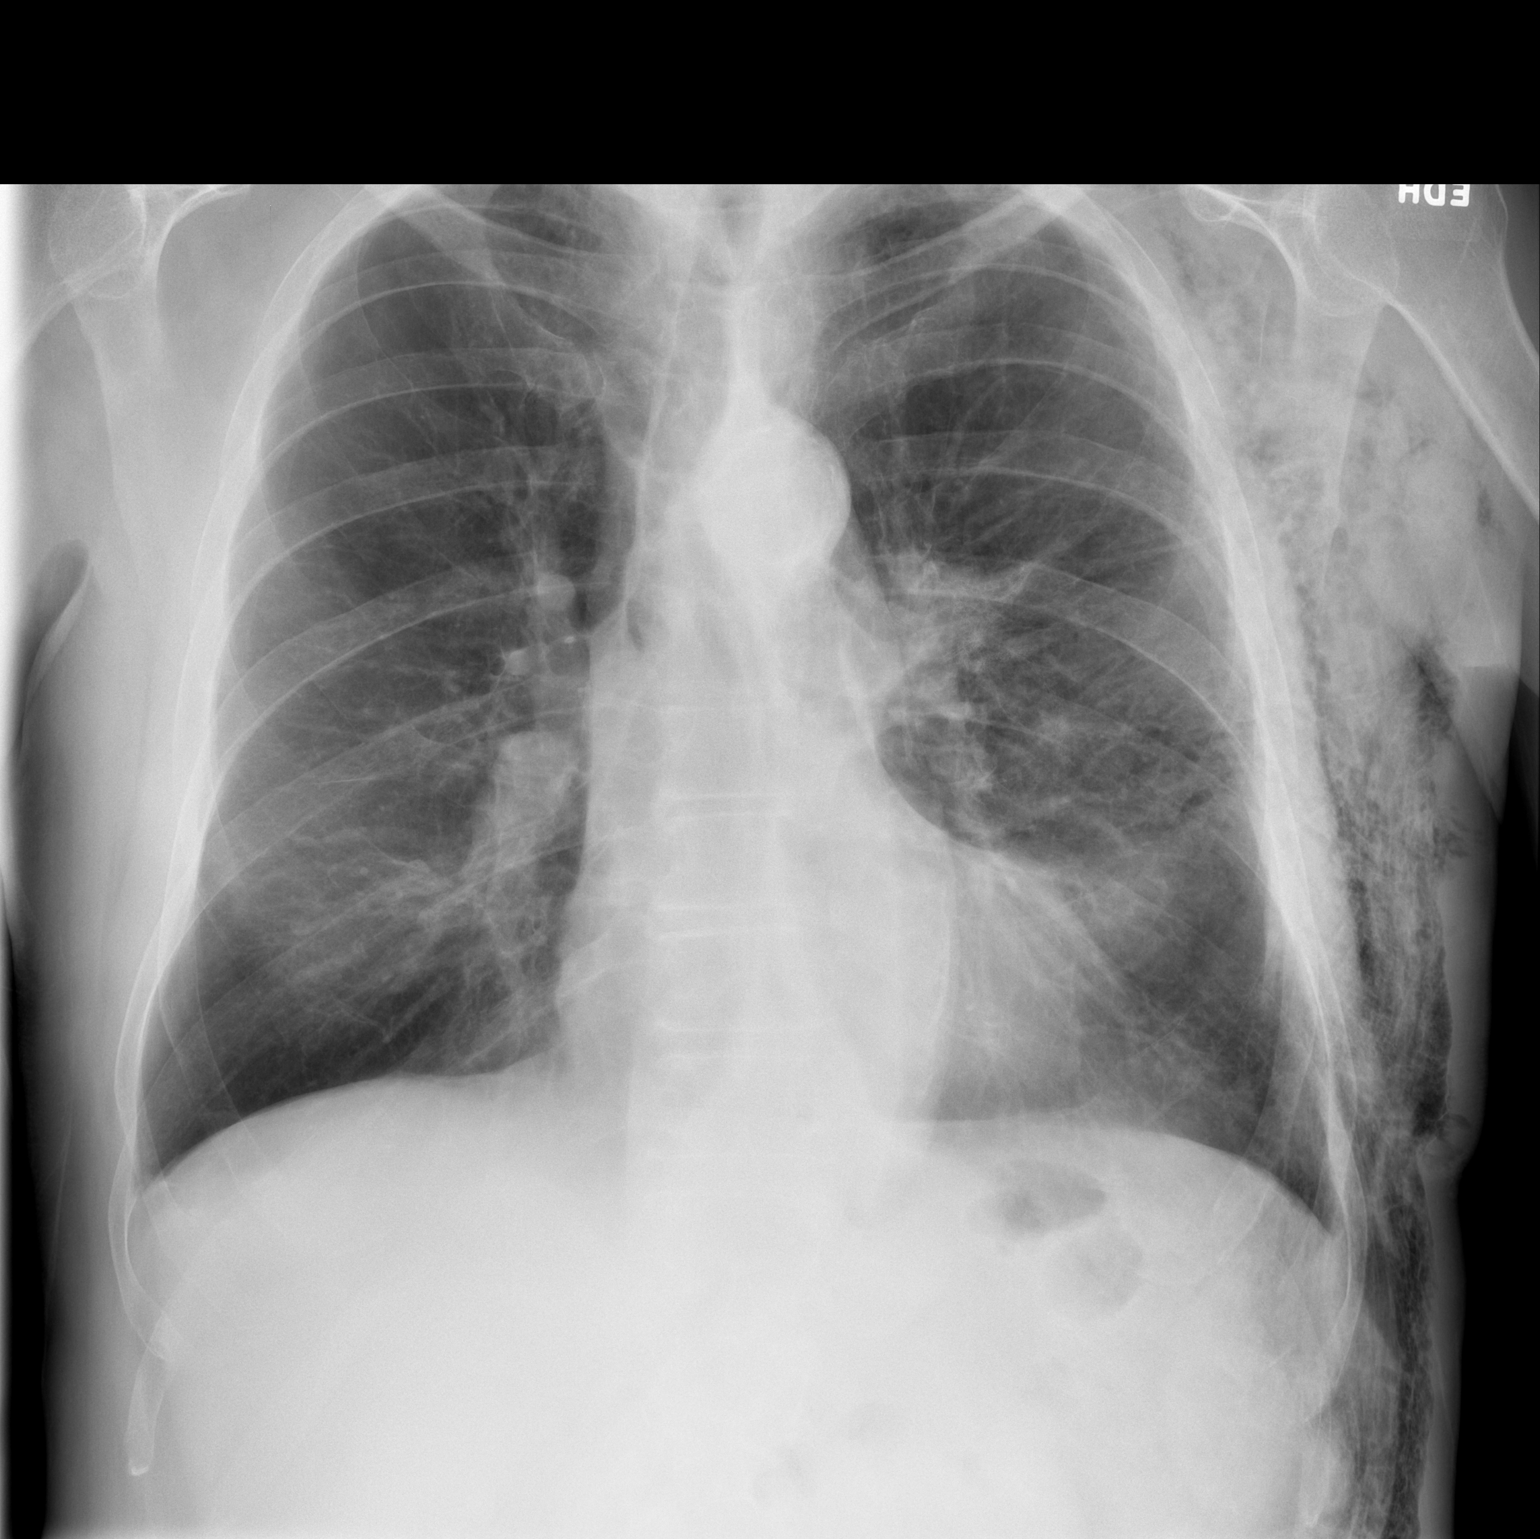

[w chest lat]
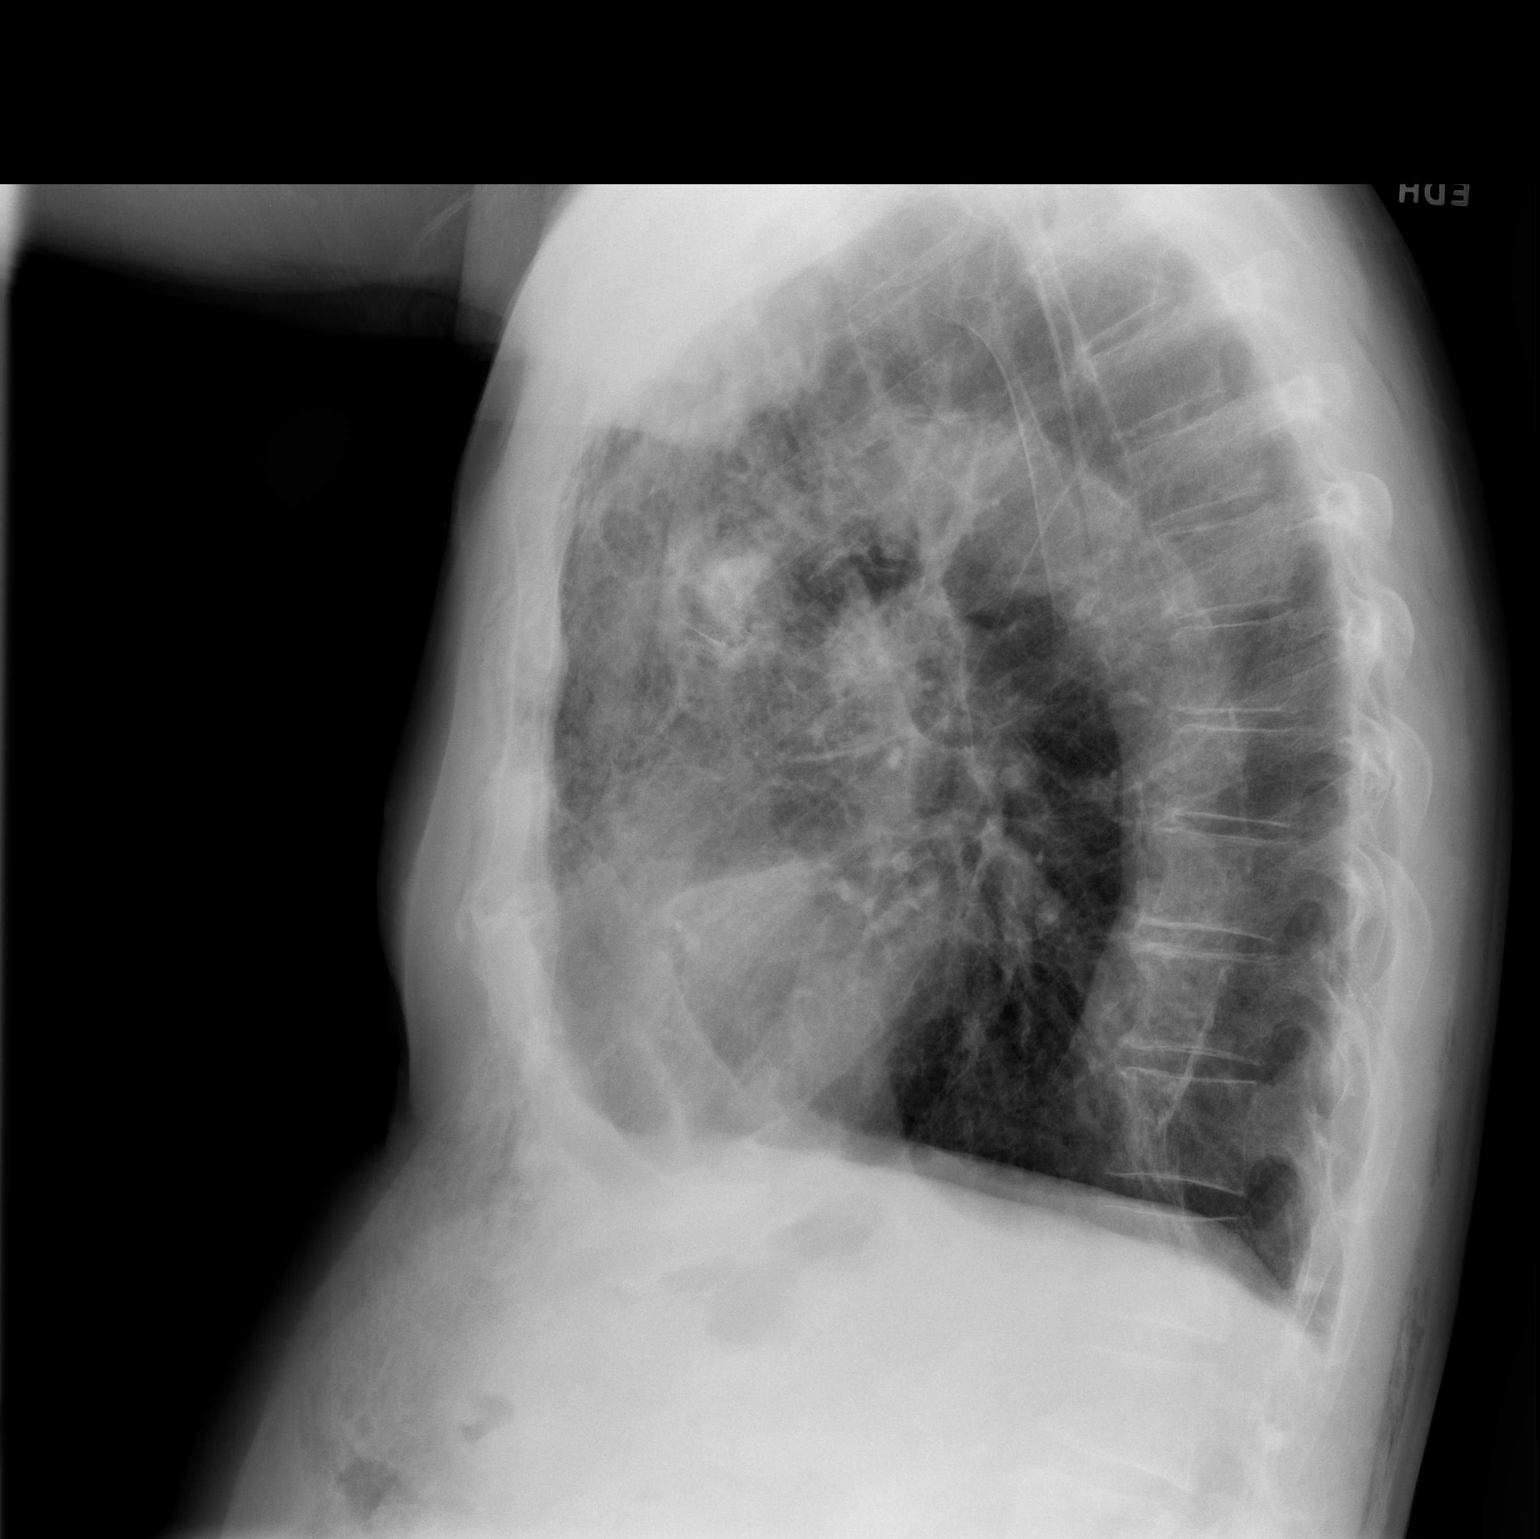

[2 of 2 positions shown; findings below may reference images not displayed]

FINDINGS: Removal of left-sided chest tube. Left perihilar postsurgical
changes. No acute consolidation or pleural effusion. The previously
noted small left apical pneumothorax is not identified. Moderate
residual soft tissue emphysema within the supraclavicular regions
and left chest wall, overall decreased as compared with 11/10/2019.
IMPRESSION: 1. Removal of left chest tube. Previously noted left apical
pneumothorax is not identified
2. Moderate residual subcutaneous emphysema but overall decreased as
compared with 11/10/2019
3. Left postsurgical changes

## 2020-12-04 ENCOUNTER — Ambulatory Visit (HOSPITAL_COMMUNITY): Payer: Medicare Other

## 2020-12-06 ENCOUNTER — Ambulatory Visit (HOSPITAL_COMMUNITY): Payer: Medicare Other

## 2020-12-11 ENCOUNTER — Ambulatory Visit (HOSPITAL_COMMUNITY): Payer: Medicare Other

## 2020-12-12 ENCOUNTER — Encounter: Payer: Self-pay | Admitting: Internal Medicine

## 2020-12-13 ENCOUNTER — Ambulatory Visit (HOSPITAL_COMMUNITY): Payer: Medicare Other

## 2020-12-13 ENCOUNTER — Other Ambulatory Visit: Payer: Self-pay | Admitting: *Deleted

## 2020-12-13 DIAGNOSIS — R338 Other retention of urine: Secondary | ICD-10-CM | POA: Diagnosis not present

## 2020-12-13 NOTE — Patient Outreach (Signed)
[facility]) Care Management  [place]  12/13/2020   Chad Gomez Mar 23, 1946 [id number]  Subjective: Successful telephone outreach call to patient. HIPAA identifiers obtained. Patient explained that his COPD is currently under control and his control inhaler Stiolto continues to work extremely well. Adding that he seldom uses his rescue inhaler. Patient did report becoming SOB while working on his car during the hot weather. He explains that he will try to limit his time outside during extreme hot weather. Patient discussed that he got a good report concerning his chest CT scan. Dr. Julien Nordmann wants to continue to monitor his lung cancer with doing CT scans every 6 months. Patient continues to be well supported by his family and  did not have any further questions or concerns today. Patient did confirm that he has this nurse's contact number to call her if needed.   Encounter Medications:  Outpatient Encounter Medications as of 12/13/2020  Medication Sig Note   acetaminophen (TYLENOL) 500 MG tablet Take 2 tablets (1,000 mg total) by mouth every 6 (six) hours as needed for mild pain.    albuterol (VENTOLIN HFA) 108 (90 Base) MCG/ACT inhaler Inhale 2 puffs into the lungs every 6 (six) hours as needed for wheezing or shortness of breath.    aspirin EC 81 MG tablet Take 81 mg by mouth daily.    atorvastatin (LIPITOR) 40 MG tablet Take 40 mg by mouth daily.    Cholecalciferol (VITAMIN D-3) 1000 units CAPS Take 1,000 Units by mouth daily.     ezetimibe (ZETIA) 10 MG tablet Take 5 mg by mouth daily. 02/21/2020: Wife states taking it as needed.    fluticasone (FLONASE) 50 MCG/ACT nasal spray Place 2 sprays into both nostrils daily.     gabapentin (NEURONTIN) 100 MG capsule Take 200 mg by mouth 3 (three) times daily. Taking 500mg  daily    metFORMIN (GLUCOPHAGE-XR) 500 MG 24 hr tablet Take 1,000 mg by mouth See admin instructions. Take 1000mg  every morning and take 1000 mg at  supper    potassium citrate (UROCIT-K) 10 MEQ (1080 MG) SR tablet Take 10 mEq by mouth in the morning and at bedtime.     Tiotropium Bromide-Olodaterol (STIOLTO RESPIMAT) 2.5-2.5 MCG/ACT AERS Inhale 2 puffs into the lungs daily.    traMADol (ULTRAM) 50 MG tablet Take 1 tablet (50 mg total) by mouth every 6 (six) hours as needed (mild pain). 07/12/2020: Have not needed this   finasteride (PROSCAR) 5 MG tablet Take 5 mg by mouth daily.  (Patient not taking: Reported on 12/13/2020) 07/12/2020: Patient states that he takes this instead of Flomax   folic acid (FOLVITE) 1 MG tablet Take 1 mg by mouth daily. (Patient not taking: Reported on 12/13/2020) 07/12/2020: Takes One A Day vitamin that has folic acid   No facility-administered encounter medications on file as of 12/13/2020.    Functional Status:  In your present state of health, do you have any difficulty performing the following activities: 01/16/2020 12/27/2019  Hearing? N N  Vision? N N  Difficulty concentrating or making decisions? N N  Walking or climbing stairs? Y N  Comment secondary to weakness -  Dressing or bathing? Y N  Doing errands, shopping? N Y  Comment - "wants wife to be with me when running errands"  Some recent data might be hidden    Fall/Depression Screening: Fall Risk  12/13/2020 07/12/2020 06/13/2020  Falls in the past year? 1 1 0  Number falls in past yr:  0 0 0  Injury with Fall? 0 0 0  Comment - - N/A- no falls reported  Risk for fall due to : History of fall(s) History of fall(s) -  Follow up Falls evaluation completed;Education provided;Falls prevention discussed Falls prevention discussed;Education provided;Falls evaluation completed Falls prevention discussed   PHQ 2/9 Scores 07/12/2020 06/13/2020 05/04/2020 02/21/2020 11/16/2019  PHQ - 2 Score 0 1 0 0 -  Exception Documentation - - - - Other- indicate reason in comment box  Not completed - - - - Spoke with wife per patient and (patient's wife / designated party  release) request.    Assessment:   Care Plan Care Plan : COPD (Adult)  Updates made by Michiel Cowboy, RN since 12/13/2020 12:00 AM     Problem: Symptom Exacerbation (COPD)   Priority: Medium     Long-Range Goal: Symptom Exacerbation Prevented or Minimized   Start Date: 05/04/2020  Expected End Date: 05/04/2021  Note:   Evidence-based guidance:  Monitor for signs of respiratory infection, including changes in sputum color, volume and thickness, as well as fever.  Encourage infection prevention strategies that may include prophylactic antibiotic therapy for patients with history of frequent exacerbations or antibiotic administration during exacerbation based on presentation, risk and benefit.  Encourage receipt of influenza and pneumococcal vaccine.  Prepare patient for use of home long-term oxygen therapy in presence of sever resting hypoxemia.  Prepare patients for laboratory studies or diagnostic exams, such as spirometry, pulse oximetry and arterial blood gas based on current symptoms, risk factors and presentation.  Assess barriers and manage adherence, including inhaler technique and persistent trigger exposure; encourage adherence, even when symptoms are controlled or infrequent.  Assess and monitor for signs/symptoms of psychosocial concerns, such as shortness of breath-anxiety cycle or depression that may impact stability of symptoms.  Identify economic resources, sociocultural beliefs, social factors and health literacy that may interfere with adherence.  Promote lifestyle changes when needed, including regular physical activity based on tolerance, weight loss, healthy eating and stress management.  Consider referral to nurse or community health worker or home-visiting program for intensive support and education (disease-management program).  Increase frequency of follow-up following exacerbation or hospitalization; consider transition of care interventions, such as hospital visit,  home visit, telephone follow-up, review of discharge summary and resource referrals.   Notes:   06/13/20: -- Goal re-established/ extended today    Task: Identify and Minimize Risk of COPD Exacerbation   Due Date: 05/04/2021  Note:   Care Management Activities:    - barriers to lifestyle changes reviewed and addressed - barriers to treatment reviewed and addressed - breathing techniques encouraged - healthy lifestyle promoted - participation in pulmonary rehabilitation encouraged - rescue (action) plan reviewed - signs/symptoms of infection reviewed - signs/symptoms of worsening disease assessed - symptom triggers identified - treatment plan reviewed  05/04/20: Caregiver/ wife reports patient has always declined vaccines of any type; has never had flu, pneumonia, or corona virus vaccines    Notes:   06/13/20: -- encouraged ongoing efforts for smoking cessation: patient reports currently smoking 1-2 ppd; reports has joined support group resource "Quit now," this was encouraged -- reviewed recent pulmonology provider office visit: encouraged patient to begin pulmonary rehabilitation once he is contacted to begin by pulmonary rehabilitation team -- confirmed patient had office visit with pharmacist at PCP office- medications were reviewed today and patient declines medication review with me; denies questions/ concerns around medications, reports takes all as prescribed -- confirms has not needed rescue  inhaler recently -- signs/ symptoms COPD yellow zone reviewed with patient along with corresponding action plan -- confirmed patient has attended scheduled provider appointments and continues driving self; remains independent in self-care/ ADL's -- goal re-established/ extended today      Goals Addressed             This Visit's Progress    [facility] CM: [facility] My Quality of Life       Timeframe:  Long-Range Goal Priority:  Medium Start Date:  05/04/20                            Expected End Date:  10/20/20                    Follow Up Date 03/22/21    - complete a living will    Why is this important?   Having a long-term illness can be scary.  It can also be stressful for you and your caregiver.  These steps may help.    Notes:   06/13/20: -- Start pulmonary rehabilitation as soon as you are able- this is a great program that will help you manage your ability to tolerate activity and any shortness of breath you might have  Updated 07/12/20 Patient requested that this nurse send him advance directive documents.      [facility] CM: Track and Manage My Symptoms       Timeframe:  Long-Range Goal Priority:  High Start Date: 05/04/20                            Expected End Date:  12/20/20                     Follow Up Date 03/22/21   - begin a symptom diary - develop a rescue plan - eliminate symptom triggers at home - follow rescue plan if symptoms flare-up - keep follow-up appointments    Why is this important?   Tracking your symptoms and other information about your health helps your doctor plan your care.  Write down the symptoms, the time of day, what you were doing and what medicine you are taking.  You will soon learn how to manage your symptoms.     Notes:   05/04/20: -- Smoking cessation strategies discussed with caregiver/ spouse  06/13/20: -- I am glad to hear that you have not had any recent episodes of shortness of breath and have not had to use your rescue inhaler recently: always keep your rescue inhaler prescription up dated and current, in case you need to use it -- review the enclosed information around managing your breathing at home; call your doctor right away if you develop symptoms of a COPD flare: don't wait to call for concerns: call early -- continue your efforts to decrease/ stop smoking -- continue to limit the salt you take in through your diet -- I have placed a referral for the [facility] to contact you next  month to continue providing support to you in your self-care  Updated: 07/12/20 Patient reports that his COPD is well controlled presently. Patient records all of his taken medications and tracks how he feels daily. He monitors his symptoms closely and contacts his providers as indicated per his COPD action plan.   Updated 12/13/20: Patient reports having good control with his COPD. He does explain  that he became short of breath while working on his car during the hot weather. Nurse discussed limiting time outside in extreme weather when possible.         Plan: RN Health Coach will send PCP a quarterly report and will call patient within the month of September. Follow-up: Patient agrees to Care Plan and Follow-up.  [name] RN, BSN [place] [phone] [email or web address]

## 2020-12-13 NOTE — Patient Instructions (Addendum)
Goals      [facility] CM: Matintain My Quality of Life     Timeframe:  Long-Range Goal Priority:  Medium Start Date:  05/04/20                           Expected End Date:  10/20/20                    Follow Up Date 03/22/21    - complete a living will    Why is this important?   Having a long-term illness can be scary.  It can also be stressful for you and your caregiver.  These steps may help.    Notes:   06/13/20: -- Start pulmonary rehabilitation as soon as you are able- this is a great program that will help you manage your ability to tolerate activity and any shortness of breath you might have  Updated 07/12/20 Patient requested that this nurse send him advance directive documents.      [facility] CM: Track and Manage My Symptoms     Timeframe:  Long-Range Goal Priority:  High Start Date: 05/04/20                            Expected End Date:  12/20/20                     Follow Up Date 03/22/21   - begin a symptom diary - develop a rescue plan - eliminate symptom triggers at home - follow rescue plan if symptoms flare-up - keep follow-up appointments    Why is this important?   Tracking your symptoms and other information about your health helps your doctor plan your care.  Write down the symptoms, the time of day, what you were doing and what medicine you are taking.  You will soon learn how to manage your symptoms.     Notes:   05/04/20: -- Smoking cessation strategies discussed with caregiver/ spouse  06/13/20: -- I am glad to hear that you have not had any recent episodes of shortness of breath and have not had to use your rescue inhaler recently: always keep your rescue inhaler prescription up dated and current, in case you need to use it -- review the enclosed information around managing your breathing at home; call your doctor right away if you develop symptoms of a COPD flare: don't wait to call for concerns: call early -- continue your efforts to decrease/ stop  smoking -- continue to limit the salt you take in through your diet -- I have placed a referral for the [facility] to contact you next month to continue providing support to you in your self-care  Updated: 07/12/20 Patient reports that his COPD is well controlled presently. Patient records all of his taken medications and tracks how he feels daily. He monitors his symptoms closely and contacts his providers as indicated per his COPD action plan.   Updated 12/13/20: Patient reports having good control with his COPD. He does explain that he became short of breath while working on his car during the hot weather. Nurse discussed limiting time outside in extreme weather when possible.

## 2020-12-18 ENCOUNTER — Ambulatory Visit (HOSPITAL_COMMUNITY): Payer: Medicare Other

## 2020-12-20 ENCOUNTER — Ambulatory Visit (HOSPITAL_COMMUNITY): Payer: Medicare Other

## 2020-12-25 ENCOUNTER — Ambulatory Visit (HOSPITAL_COMMUNITY): Payer: Medicare Other

## 2020-12-25 DIAGNOSIS — E113293 Type 2 diabetes mellitus with mild nonproliferative diabetic retinopathy without macular edema, bilateral: Secondary | ICD-10-CM | POA: Diagnosis not present

## 2020-12-25 DIAGNOSIS — H2512 Age-related nuclear cataract, left eye: Secondary | ICD-10-CM | POA: Diagnosis not present

## 2020-12-25 DIAGNOSIS — E7800 Pure hypercholesterolemia, unspecified: Secondary | ICD-10-CM | POA: Diagnosis not present

## 2020-12-25 DIAGNOSIS — H25012 Cortical age-related cataract, left eye: Secondary | ICD-10-CM | POA: Diagnosis not present

## 2020-12-25 DIAGNOSIS — E1165 Type 2 diabetes mellitus with hyperglycemia: Secondary | ICD-10-CM | POA: Diagnosis not present

## 2020-12-25 DIAGNOSIS — Z961 Presence of intraocular lens: Secondary | ICD-10-CM | POA: Diagnosis not present

## 2020-12-25 DIAGNOSIS — H348312 Tributary (branch) retinal vein occlusion, right eye, stable: Secondary | ICD-10-CM | POA: Diagnosis not present

## 2020-12-25 DIAGNOSIS — H524 Presbyopia: Secondary | ICD-10-CM | POA: Diagnosis not present

## 2020-12-25 DIAGNOSIS — H0102B Squamous blepharitis left eye, upper and lower eyelids: Secondary | ICD-10-CM | POA: Diagnosis not present

## 2020-12-27 ENCOUNTER — Ambulatory Visit (HOSPITAL_COMMUNITY): Payer: Medicare Other

## 2020-12-27 DIAGNOSIS — E1142 Type 2 diabetes mellitus with diabetic polyneuropathy: Secondary | ICD-10-CM | POA: Diagnosis not present

## 2020-12-27 DIAGNOSIS — E7800 Pure hypercholesterolemia, unspecified: Secondary | ICD-10-CM | POA: Diagnosis not present

## 2020-12-27 DIAGNOSIS — I10 Essential (primary) hypertension: Secondary | ICD-10-CM | POA: Diagnosis not present

## 2020-12-27 DIAGNOSIS — I2510 Atherosclerotic heart disease of native coronary artery without angina pectoris: Secondary | ICD-10-CM | POA: Diagnosis not present

## 2021-01-01 ENCOUNTER — Ambulatory Visit (HOSPITAL_COMMUNITY): Payer: Medicare Other

## 2021-01-02 ENCOUNTER — Encounter: Payer: Self-pay | Admitting: *Deleted

## 2021-01-03 ENCOUNTER — Telehealth: Payer: Self-pay | Admitting: Physician Assistant

## 2021-01-03 ENCOUNTER — Ambulatory Visit (HOSPITAL_COMMUNITY): Payer: Medicare Other

## 2021-01-03 NOTE — Telephone Encounter (Signed)
Scheduled appointment per 07/13 sch msg. Patient is aware.

## 2021-01-04 NOTE — Progress Notes (Deleted)
[facility] OFFICE PROGRESS NOTE  [name], MD [address] [place] [address]  DIAGNOSIS: Stage IIIA (T2a, N2, M0) non-small cell lung cancer, adenocarcinoma.  Patient presented with a left upper lobe nodule. He was diagnosed in March 2021   Biomarkers/PDL1:   Detected EGFR mutation in exon 21 Positive for the EGFR P848L mutation in exon 21. No mutation was detected in exons 12, 18, 19, and 20. The clinical significance of P848L in exon 21 is uncertain. This variant has been reported to behave like functionally silent polymorphism. [name], M.M., et al. Mol Cancer 6, 56 (2007). PD-L1 Expression Level: TPS < 1% PD-L1 Expression Status: No Expression NEGATIVE FOR ALK REARRANGEMENT  PRIOR THERAPY: 1) Robotic assisted wedge resection of the left upper lobe under the care of Dr. Servando Snare.  This was performed on 10/28/2019. 2) Adjuvant systemic chemotherapy on the [facility] A 237628 protocol with cisplatin 70 mg/M2, pemetrexed 500 mg/M2 every 3 weeks in addition to pembrolizumab 400 mg IV every 6 weeks.  Start date December 19, 2019.  Status post 2 cycles.  This was discontinued secondary to intolerance.  CURRENT THERAPY: Observation   INTERVAL HISTORY: Chad Gomez 75 y.o. male returns to clinic today for follow-up visit accompanied by his wife.  The patient was last seen in the clinic in May 2022.  The patient was diagnosed with stage IIIa lung cancer in 2021.  He underwent surgical resection under the care of Dr. Servando Snare.  He then was to undergo adjuvant systemic chemotherapy for a total of 4 cycles but this was discontinued after 2 cycles secondary to intolerance.  He has been on observation since the summer 2021.  The patient recently had a follow-up appointment with his primary care provider and routine labs showed hypercalcemia for which she was referred back to the clinic today for further evaluation and to rule out hypercalcemia secondary to  malignancy.  Overall, the patient is feeling _except for fatigue.  He denies any chest pain, shortness of breath,, cough, or hemoptysis.  He denies any fever, chills, night sweats, or unexplained weight loss.  He denies any focal bone pain.  The patient denies excessive calcium intake.  He denies any supplementation.  He denies any nausea, vomiting, diarrhea, or constipation.  He denies any headache or visual changes.  The patient is scheduled to undergo a restaging CT scan of the chest in early August 2022.  He is here today for evaluation and repeat blood work   MEDICAL HISTORY: Past Medical History:  Diagnosis Date   BPH (benign prostatic hypertrophy)    weak stream   Chronic kidney disease    Complication of anesthesia    urinary retention   COPD (chronic obstructive pulmonary disease) (HCC)    Diabetes mellitus without complication (HCC)    Type II   History of bladder stone    History of colon polyps    08/2013   pre-canerous   History of kidney stones    History of melanoma excision    LEFT EAR   Hyperlipidemia    Hypertension    Iron deficiency    Lung cancer, lingula ([place]) 11/01/2019   Open wound    left lower jaw; followed by ENT   Peripheral vascular disease ([place])    Right ureteral stone    Shingles 05-15-2015    ALLERGIES:  is allergic to morphine and related, tape, amoxicillin, and naproxen.  MEDICATIONS:  Current Outpatient Medications  Medication Sig Dispense  Refill   acetaminophen (TYLENOL) 500 MG tablet Take 2 tablets (1,000 mg total) by mouth every 6 (six) hours as needed for mild pain. 30 tablet 0   albuterol (VENTOLIN HFA) 108 (90 Base) MCG/ACT inhaler Inhale 2 puffs into the lungs every 6 (six) hours as needed for wheezing or shortness of breath. 8 g 3   aspirin EC 81 MG tablet Take 81 mg by mouth daily.     atorvastatin (LIPITOR) 40 MG tablet Take 40 mg by mouth daily.     Cholecalciferol (VITAMIN D-3) 1000 units CAPS Take 1,000 Units by mouth daily.       ezetimibe (ZETIA) 10 MG tablet Take 5 mg by mouth daily.     finasteride (PROSCAR) 5 MG tablet Take 5 mg by mouth daily.  (Patient not taking: Reported on 12/13/2020)     fluticasone (FLONASE) 50 MCG/ACT nasal spray Place 2 sprays into both nostrils daily.      folic acid (FOLVITE) 1 MG tablet Take 1 mg by mouth daily. (Patient not taking: Reported on 12/13/2020)     gabapentin (NEURONTIN) 100 MG capsule Take 200 mg by mouth 3 (three) times daily. Taking 537m daily     metFORMIN (GLUCOPHAGE-XR) 500 MG 24 hr tablet Take 1,000 mg by mouth See admin instructions. Take 10079mevery morning and take 1000 mg at supper     potassium citrate (UROCIT-K) 10 MEQ (1080 MG) SR tablet Take 10 mEq by mouth in the morning and at bedtime.      Tiotropium Bromide-Olodaterol (STIOLTO RESPIMAT) 2.5-2.5 MCG/ACT AERS Inhale 2 puffs into the lungs daily. 12 g 3   traMADol (ULTRAM) 50 MG tablet Take 1 tablet (50 mg total) by mouth every 6 (six) hours as needed (mild pain). 20 tablet 0   No current facility-administered medications for this visit.    SURGICAL HISTORY:  Past Surgical History:  Procedure Laterality Date   ABDOMINAL AORTAGRAM  11/01/2014   Procedure: Abdominal Aortagram;  Surgeon: VaSerafina MitchellMD;  [facility];  Service: Cardiovascular;;   AORTA - BILATERAL FEMORAL ARTERY BYPASS GRAFT N/A 12/28/2014   Procedure: AORTOBIFEMORAL BYPASS GRAFT;  Surgeon: VaSerafina MitchellMD;  [facility];  Laterality: N/A;   BRONCHIAL BRUSHINGS  09/06/2019   Procedure: BRONCHIAL BRUSHINGS;  Surgeon: ByCollene GobbleMD;  [facility];  Service: Pulmonary;;  left upper lobe nodule   BRONCHIAL WASHINGS  09/06/2019   Procedure: BRONCHIAL WASHINGS;  Surgeon: ByCollene GobbleMD;  [facility];  Service: Pulmonary;;   COLONOSCOPY W/ POLYPECTOMY  09-13-2013   CYSTOLITHALOPAXY OF BLADDER STONE/ TRANSRECTAL ULTRASOUND PROSTATE BX  08-19-2005   CYSTOSCOPY W/ RETROGRADES  Right 12/02/2012   Procedure: CYSTOSCOPY WITH RETROGRADE PYELOGRAM;  Surgeon: JoMalka SoMD;  [facility]: [facility];  Laterality: Right;   CYSTOSCOPY W/ RETROGRADES Bilateral 10/13/2013   Procedure: CYSTOSCOPY WITH RETROGRADE PYELOGRAM;  Surgeon: JoIrine SealMD;  [facility];  Laterality: Bilateral;   CYSTOSCOPY W/ URETERAL STENT PLACEMENT Left 04/11/2014   Procedure: CYSTOSCOPY WITH STENT REPLACEMENT;  Surgeon: JoMalka SoMD;  [facility];  Laterality: Left;   CYSTOSCOPY WITH LITHOLAPAXY Right 12/02/2012   Procedure: CYSTOSCOPY WITH stone extraction;  Surgeon: JoMalka SoMD;  [facility];  Laterality: Right;   CYSTOSCOPY WITH LITHOLAPAXY Right 10/13/2013   Procedure: CYSTOSCOPY WITH LITHOLAPAXY,  removal of urethral stone with basket;  Surgeon: Irine Seal, MD;  Location[facility];  Service: Urology;  Laterality: Right;   CYSTOSCOPY WITH RETROGRADE PYELOGRAM, URETEROSCOPY AND STENT PLACEMENT Left 03/21/2014   Procedure: CYSTOSCOPY WITH RETROGRADE PYELOGRAM, AND LEFT STENT PLACEMENT;  Surgeon: Alexis Frock, MD;  [facility];  Service: Urology;  Laterality: Left;   CYSTOSCOPY WITH RETROGRADE PYELOGRAM, URETEROSCOPY AND STENT PLACEMENT  09/09/2016   Procedure: CYSTOSCOPY WITH RIGHT  RETROGRADE PYELOGRAM, URETEROSCOPY WITH LASER BASKET EXTRACTION OF STONES  AND STENT PLACEMENT;  Surgeon: Irine Seal, MD;  Location: [facility];  Service: Urology;;   CYSTOSCOPY WITH URETEROSCOPY Left 04/11/2014   Procedure: CYSTOSCOPY WITH URETEROSCOPY/ STONE EXTRACTION;  Surgeon: Malka So, MD;  [facility];  Service: Urology;  Laterality: Left;   FINE NEEDLE ASPIRATION  09/06/2019   Procedure: FINE NEEDLE ASPIRATION;  Surgeon: Collene Gobble, MD;  Location: [facility] ENDOSCOPY;  Service:  Pulmonary;;  left upper lobe - lung   HOLMIUM LASER APPLICATION Right 1/85/6314   Procedure: HOLMIUM LASER APPLICATION;  Surgeon: Malka So, MD;  Location: [facility];  Service: Urology;  Laterality: Right;   HOLMIUM LASER APPLICATION Left 97/07/6376   Procedure: HOLMIUM LASER APPLICATION;  Surgeon: Malka So, MD;  Location: [facility];  Service: Urology;  Laterality: Left;   HOLMIUM LASER APPLICATION Right 5/88/5027   Procedure: HOLMIUM LASER APPLICATION;  Surgeon: Irine Seal, MD;  Location: [facility];  Service: Urology;  Laterality: Right;   INGUINAL HERNIA REPAIR Right 1954   INTERCOSTAL NERVE BLOCK Left 10/28/2019   Procedure: Intercostal Nerve Block;  Surgeon: [name], MD;  Location: [place];  Service: Thoracic;  Laterality: Left;   LITHOTRIPSY  2007 & 02/2013   LUNG BIOPSY  09/06/2019   Procedure: LUNG BIOPSY;  Surgeon: Collene Gobble, MD;  Location: [facility] ENDOSCOPY;  Service: Pulmonary;;  left upper lob lung   MANDIBLE FRACTURE SURGERY  1995   MICROLARYNGOSCOPY Right 02/18/2016   Procedure: MICRO DIRECT LARYNGOSCOPY EXCISIONAL BIOPSY OF RIGHT VOCAL CORD MASS;  Surgeon: Leta Baptist, MD;  Location: [facility];  Service: ENT;  Laterality: Right;   [place]   left ear   MULTIPLE TOOTH [place]   NEPHROLITHOTOMY Left 01/27/2013   Procedure: NEPHROLITHOTOMY PERCUTANEOUS FIRST LOOK;  Surgeon: Malka So, MD;  [facility];  Service: Urology;  Laterality: Left;   NODE DISSECTION  10/28/2019   Procedure: Node Dissection;  Surgeon: [name], MD;  Location: [place];  Service: Thoracic;;   PERIPHERAL VASCULAR CATHETERIZATION N/A 11/01/2014   Procedure: Lower Extremity Angiography;  Surgeon: Serafina Mitchell, MD;  Location: [place] CV LAB;  Service: Cardiovascular;  Laterality: N/A;   URETEROSCOPY Right 12/02/2012   Procedure: RIGHT URETEROSCOPY STONE EXTRACTION WITH  STENT PLACEMENT;  Surgeon: Malka So, MD;  [facility];  Service: Urology;  Laterality: Right;   VIDEO BRONCHOSCOPY  10/28/2019   VIDEO BRONCHOSCOPY WITH ENDOBRONCHIAL NAVIGATION WITH LUNG MARKINGS (N/A   VIDEO BRONCHOSCOPY WITH ENDOBRONCHIAL NAVIGATION N/A 09/06/2019   Procedure: VIDEO BRONCHOSCOPY WITH ENDOBRONCHIAL NAVIGATION;  Surgeon: Collene Gobble, MD;  Location: [place] [facility]OSCOPY;  Service: Pulmonary;  Laterality: N/A;   VIDEO BRONCHOSCOPY WITH ENDOBRONCHIAL NAVIGATION N/A 10/28/2019   Procedure: VIDEO BRONCHOSCOPY WITH ENDOBRONCHIAL NAVIGATION WITH LUNG MARKINGS;  Surgeon: [name], MD;  Location: [place];  Service: Thoracic;  Laterality: N/A;  WEDGE RESECTION  10/28/2019   LEFT UPPER LOBE    REVIEW OF SYSTEMS:   Review of Systems  Constitutional: Negative for appetite change, chills, fatigue, fever and unexpected weight change.  HENT:   Negative for mouth sores, nosebleeds, sore throat and trouble swallowing.   Eyes: Negative for eye problems and icterus.  Respiratory: Negative for cough, hemoptysis, shortness of breath and wheezing.   Cardiovascular: Negative for chest pain and leg swelling.  Gastrointestinal: Negative for abdominal pain, constipation, diarrhea, nausea and vomiting.  Genitourinary: Negative for bladder incontinence, difficulty urinating, dysuria, frequency and hematuria.   Musculoskeletal: Negative for back pain, gait problem, neck pain and neck stiffness.  Skin: Negative for itching and rash.  Neurological: Negative for dizziness, extremity weakness, gait problem, headaches, light-headedness and seizures.  Hematological: Negative for adenopathy. Does not bruise/bleed easily.  Psychiatric/Behavioral: Negative for confusion, depression and sleep disturbance. The patient is not nervous/anxious.     PHYSICAL EXAMINATION:  There were no vitals taken for this visit.  ECOG PERFORMANCE STATUS: {CHL ONC ECOG [id number]  Physical Exam  Constitutional: Oriented to  person, place, and time and well-developed, well-nourished, and in no distress. No distress.  HENT:  Head: Normocephalic and atraumatic.  Mouth/Throat: Oropharynx is clear and moist. No oropharyngeal exudate.  Eyes: Conjunctivae are normal. Right eye exhibits no discharge. Left eye exhibits no discharge. No scleral icterus.  Neck: Normal range of motion. Neck supple.  Cardiovascular: Normal rate, regular rhythm, normal heart sounds and intact distal pulses.   Pulmonary/Chest: Effort normal and breath sounds normal. No respiratory distress. No wheezes. No rales.  Abdominal: Soft. Bowel sounds are normal. Exhibits no distension and no mass. There is no tenderness.  Musculoskeletal: Normal range of motion. Exhibits no edema.  Lymphadenopathy:    No cervical adenopathy.  Neurological: Alert and oriented to person, place, and time. Exhibits normal muscle tone. Gait normal. Coordination normal.  Skin: Skin is warm and dry. No rash noted. Not diaphoretic. No erythema. No pallor.  Psychiatric: Mood, memory and judgment normal.  Vitals reviewed.  LABORATORY DATA: Lab Results  Component Value Date   WBC 8.6 11/05/2020   HGB 15.1 11/05/2020   HCT 45.1 11/05/2020   MCV 88.1 11/05/2020   PLT 201 11/05/2020      Chemistry      Component Value Date/Time   NA 137 11/05/2020 1056   K 3.8 11/05/2020 1056   CL 99 11/05/2020 1056   CO2 31 11/05/2020 1056   BUN 14 11/05/2020 1056   CREATININE 1.16 11/05/2020 1056      Component Value Date/Time   CALCIUM 10.5 (H) 11/05/2020 1056   ALKPHOS 79 11/05/2020 1056   AST 17 11/05/2020 1056   ALT 12 11/05/2020 1056   BILITOT 0.6 11/05/2020 1056       RADIOGRAPHIC STUDIES:  No results found.   ASSESSMENT/PLAN:  This is a very pleasant 75 year old Caucasian male diagnosed with stage IIIa (T2 a, N2, M0) non-small cell lung cancer, adenocarcinoma.  He presented with a left upper lobe lung nodule and after resection was found to have metastatic  disease to the mediastinal lymph nodes.  He was diagnosed in March 2021.  He is positive for a*mutation.  The patient underwent adjuvant systemic chemotherapy with cisplatin, Alimta, and Keytruda.  He was status post 2 cycles but this was discontinued secondary to significant intolerability with frequent admissions to the hospital with enteritis, acute renal failure, as well as pancytopenia.  He has been on observation since ***  The patient recently had a follow-up appointment with his PCP and had a repeat CBC, and CMP performed which reportedly showed hypercalcemia.  The patient was seen with Dr. Julien Nordmann today.  The patient repeat labs performed today which shows _.   Dr. Julien Nordmann recommends  IV hydration  Lasix?  Bisphosphonates?  The patient will have a restaging CT scan in the next 2 weeks as scheduled.  We will see him back for follow-up visit a few days later for evaluation and to review his scan results.   The patient was advised to call immediately if he has any concerning symptoms in the interval. The patient voices understanding of current disease status and treatment options and is in agreement with the current care plan. All questions were answered. The patient knows to call the clinic with any problems, questions or concerns. We can certainly see the patient much sooner if necessary      No orders of the defined types were placed in this encounter.    I spent {CHL ONC TIME VISIT - NOMVE:[id number]} counseling the patient face to face. The total time spent in the appointment was {CHL ONC TIME VISIT - EZMOQ:[id number]}.  Eligah Anello L Jenaye Rickert, PA-C 01/04/21

## 2021-01-08 ENCOUNTER — Ambulatory Visit (HOSPITAL_COMMUNITY): Payer: Medicare Other

## 2021-01-09 ENCOUNTER — Ambulatory Visit: Payer: Medicare Other | Admitting: Physician Assistant

## 2021-01-09 ENCOUNTER — Other Ambulatory Visit: Payer: Medicare Other

## 2021-01-09 ENCOUNTER — Telehealth: Payer: Self-pay | Admitting: Physician Assistant

## 2021-01-09 NOTE — Telephone Encounter (Signed)
The patient was scheduled for a follow-up appointment today on 01/09/2021 after receiving a message about a concern for hypercalcemia from his PCP and concern about metastatic bone disease.  The patient did not show up to his appointment today.  I called the patient and spoke to his wife.  The patient's wife states that they were unable to make it because their car broke down. We were supposed to have a restaging CT scan of the chest on 01/25/21 and follow up with Dr. Julien Nordmann on 01/28/21. The CT scan has not been scheduled. I called his wife and gave her the phone number to radiology scheduling and instructed her to call to schedule his CT scan. He does not have a lab appointment before his scan. I will send a scheduling message to make a lab appointment about 1 hour before his scan ~8/5. We will see him back for a follow up on 01/28/21 and to review the scan results.

## 2021-01-10 ENCOUNTER — Ambulatory Visit (HOSPITAL_COMMUNITY): Payer: Medicare Other

## 2021-01-14 ENCOUNTER — Telehealth: Payer: Self-pay | Admitting: Physician Assistant

## 2021-01-14 NOTE — Telephone Encounter (Signed)
Scheduled appointment per 07/25 sch msg. Patient is aware.

## 2021-01-15 ENCOUNTER — Ambulatory Visit (HOSPITAL_COMMUNITY): Payer: Medicare Other

## 2021-01-16 ENCOUNTER — Other Ambulatory Visit: Payer: Self-pay | Admitting: *Deleted

## 2021-01-16 DIAGNOSIS — C3490 Malignant neoplasm of unspecified part of unspecified bronchus or lung: Secondary | ICD-10-CM

## 2021-01-17 ENCOUNTER — Ambulatory Visit (HOSPITAL_COMMUNITY): Payer: Medicare Other

## 2021-01-17 ENCOUNTER — Telehealth (HOSPITAL_COMMUNITY): Payer: Self-pay

## 2021-01-17 NOTE — Telephone Encounter (Signed)
Called and spoke with pt in regard to PR, pt stated he is no longer interested.   Closed referral

## 2021-01-18 DIAGNOSIS — R6889 Other general symptoms and signs: Secondary | ICD-10-CM | POA: Diagnosis not present

## 2021-01-18 DIAGNOSIS — R338 Other retention of urine: Secondary | ICD-10-CM | POA: Diagnosis not present

## 2021-01-22 ENCOUNTER — Telehealth: Payer: Self-pay | Admitting: *Deleted

## 2021-01-22 ENCOUNTER — Ambulatory Visit (HOSPITAL_COMMUNITY): Payer: Medicare Other

## 2021-01-22 NOTE — Telephone Encounter (Signed)
[id number] Study:  Patient's wife informed of research blood to be collected during patient's lab appointment tomorrow and that research nurse will plan to see him when he comes in for his visit with Dr. Julien Nordmann next week.  She verbalized understanding and agreed to notify patient. Thanked her for her time and plan to see them both next week.   Foye Spurling, BSN, RN Clinical Research Nurse 01/22/2021 12:12 PM

## 2021-01-23 ENCOUNTER — Ambulatory Visit (HOSPITAL_COMMUNITY): Payer: Medicare Other

## 2021-01-23 ENCOUNTER — Other Ambulatory Visit: Payer: Medicare Other

## 2021-01-24 ENCOUNTER — Ambulatory Visit (HOSPITAL_COMMUNITY): Payer: Medicare Other

## 2021-01-28 ENCOUNTER — Ambulatory Visit: Payer: Medicare Other | Admitting: Internal Medicine

## 2021-01-28 ENCOUNTER — Other Ambulatory Visit: Payer: Medicare Other

## 2021-01-28 IMAGING — CR DG CHEST 2V
2 series · 2 of 2 positions shown · non-contrast
Comparison: 01/01/2020

CLINICAL DATA: Dyspnea

EXAM:
CHEST - 2 VIEW

[w chest lat]
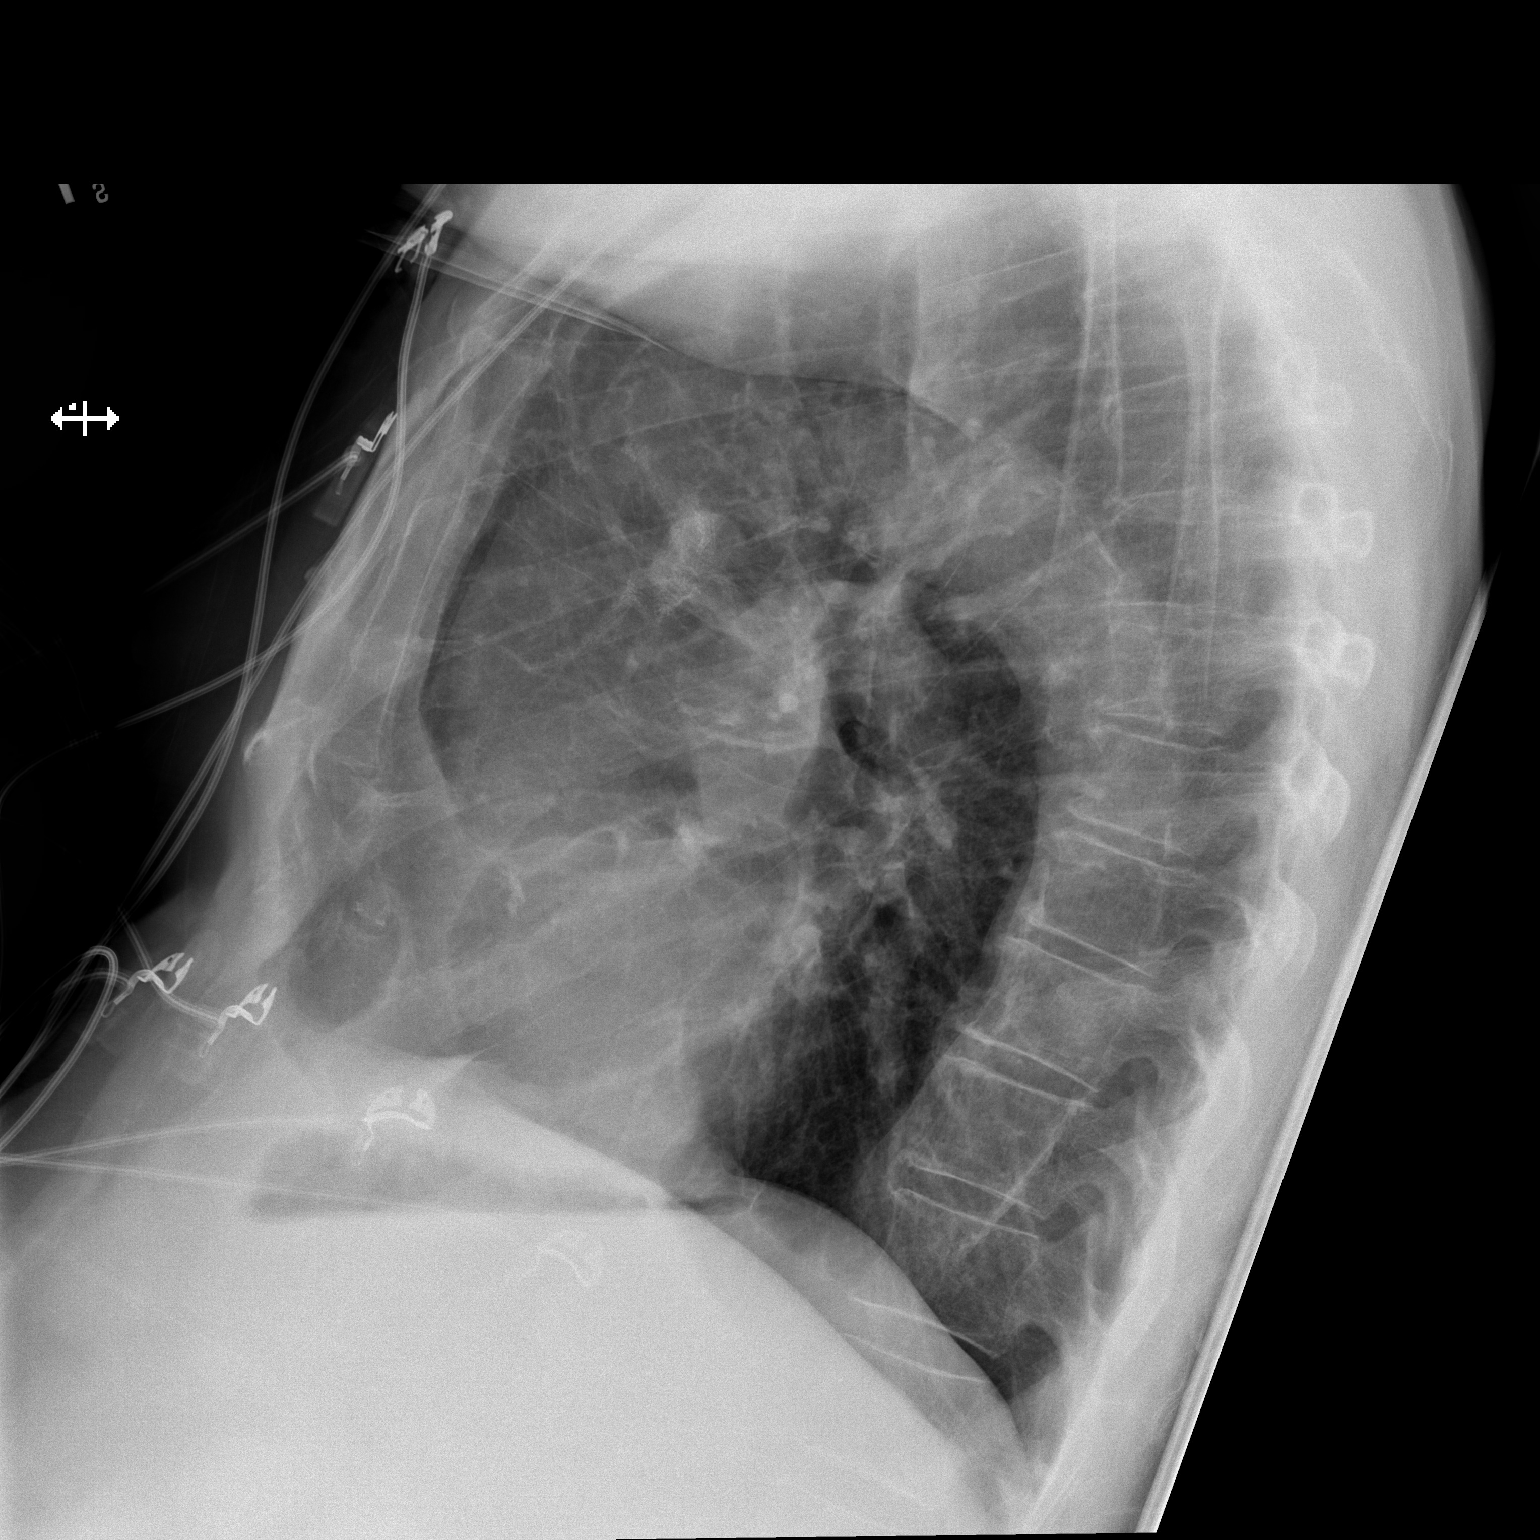

[x chest ap]
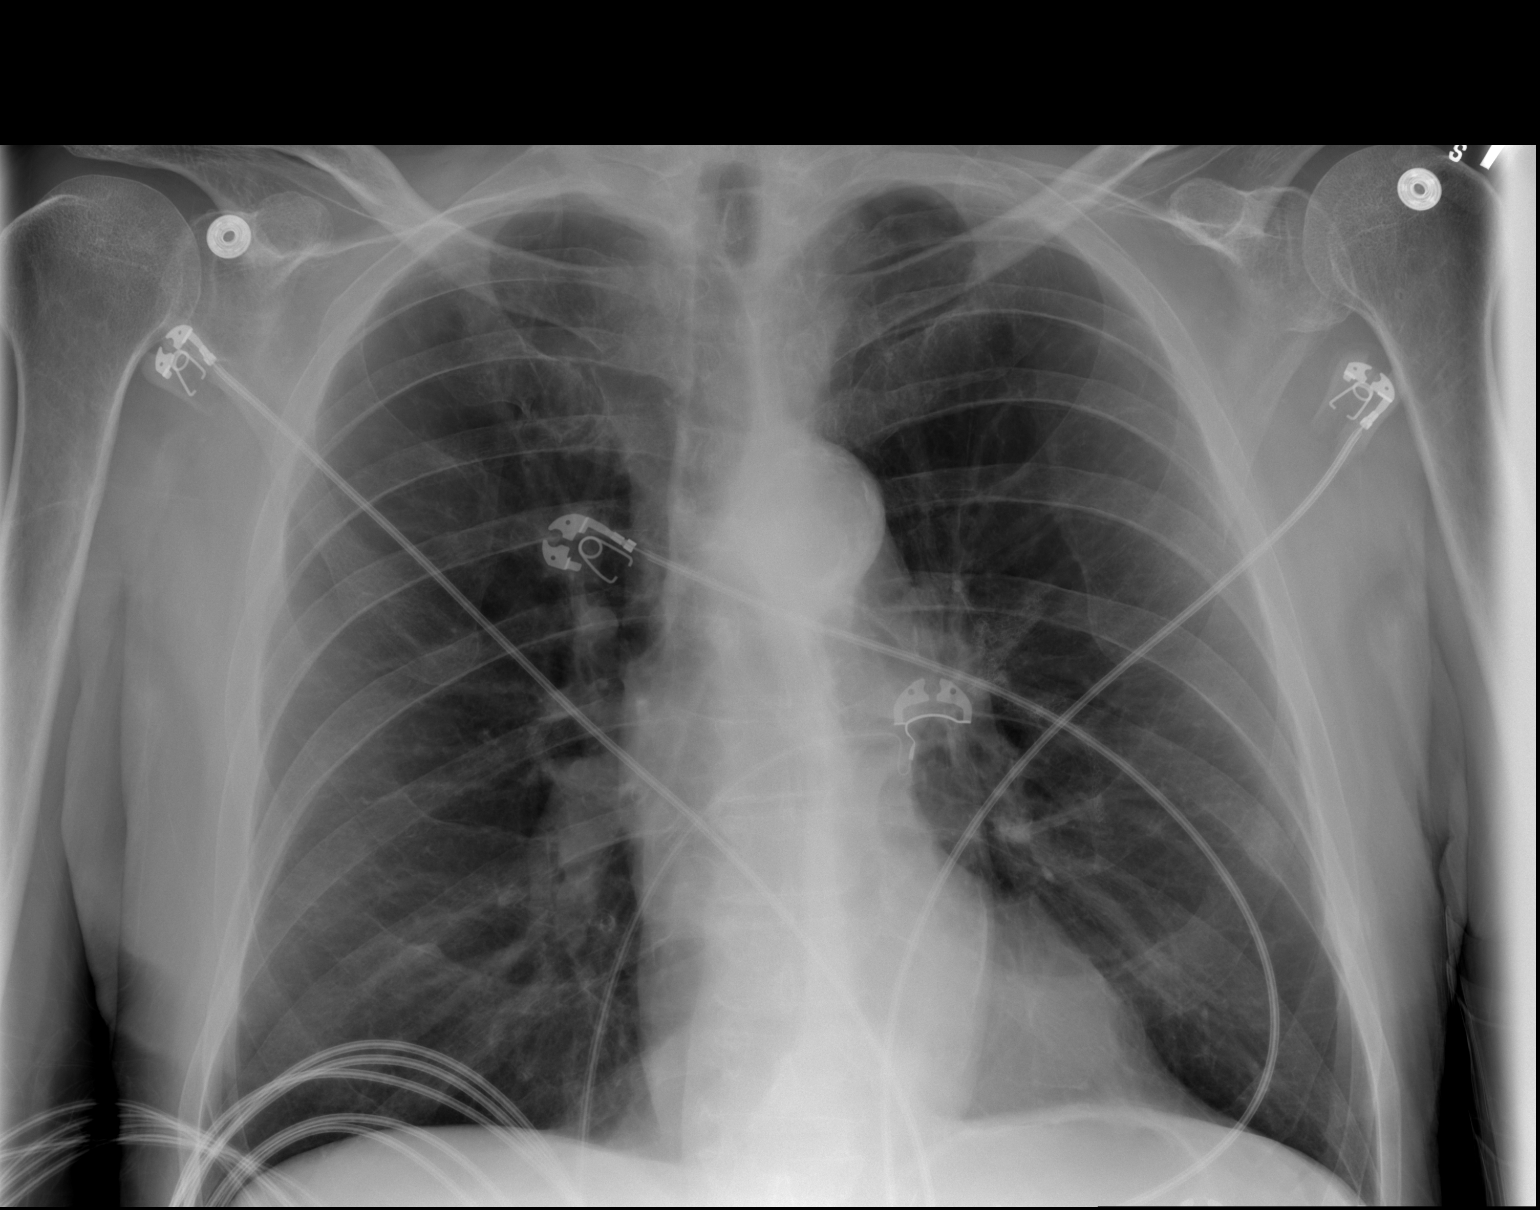

[2 of 2 positions shown; findings below may reference images not displayed]

FINDINGS: Previously noted PICC line has been removed. The lungs are mildly
hyperinflated, in keeping with changes of underlying COPD, unchanged
from prior examination. No superimposed focal pulmonary infiltrate.
Stable nodule within the left lung base laterally, indeterminate.
Surgical staple line noted at the left hilum. No pneumothorax or
pleural effusion. Cardiac size within normal limits. No acute bone
abnormality.
IMPRESSION: No active cardiopulmonary disease.

COPD.

Stable nodule at the left lung base laterally. This could be better
assessed with CT examination once the patient's acute issues have
resolved.

## 2021-01-29 ENCOUNTER — Ambulatory Visit (HOSPITAL_COMMUNITY): Payer: Medicare Other

## 2021-01-29 DIAGNOSIS — I739 Peripheral vascular disease, unspecified: Secondary | ICD-10-CM | POA: Diagnosis not present

## 2021-01-29 DIAGNOSIS — Z961 Presence of intraocular lens: Secondary | ICD-10-CM | POA: Diagnosis not present

## 2021-01-29 DIAGNOSIS — E559 Vitamin D deficiency, unspecified: Secondary | ICD-10-CM | POA: Diagnosis not present

## 2021-01-29 DIAGNOSIS — H25012 Cortical age-related cataract, left eye: Secondary | ICD-10-CM | POA: Diagnosis not present

## 2021-01-29 DIAGNOSIS — H2512 Age-related nuclear cataract, left eye: Secondary | ICD-10-CM | POA: Diagnosis not present

## 2021-01-29 DIAGNOSIS — N200 Calculus of kidney: Secondary | ICD-10-CM | POA: Diagnosis not present

## 2021-01-29 DIAGNOSIS — F17200 Nicotine dependence, unspecified, uncomplicated: Secondary | ICD-10-CM | POA: Diagnosis not present

## 2021-01-31 ENCOUNTER — Ambulatory Visit (HOSPITAL_COMMUNITY): Payer: Medicare Other

## 2021-02-05 ENCOUNTER — Inpatient Hospital Stay: Payer: Medicare Other | Attending: Physician Assistant

## 2021-02-05 ENCOUNTER — Ambulatory Visit (HOSPITAL_COMMUNITY)
Admission: RE | Admit: 2021-02-05 | Discharge: 2021-02-05 | Disposition: A | Discharge status: Home / Self Care | Payer: Medicare Other | Source: Ambulatory Visit | Attending: Internal Medicine | Admitting: Internal Medicine

## 2021-02-05 ENCOUNTER — Other Ambulatory Visit: Payer: Self-pay

## 2021-02-05 ENCOUNTER — Encounter (HOSPITAL_COMMUNITY): Payer: Self-pay

## 2021-02-05 ENCOUNTER — Telehealth: Payer: Self-pay | Admitting: *Deleted

## 2021-02-05 DIAGNOSIS — I700 Atherosclerosis of aorta: Secondary | ICD-10-CM | POA: Diagnosis not present

## 2021-02-05 DIAGNOSIS — C3412 Malignant neoplasm of upper lobe, left bronchus or lung: Secondary | ICD-10-CM | POA: Diagnosis not present

## 2021-02-05 DIAGNOSIS — C3490 Malignant neoplasm of unspecified part of unspecified bronchus or lung: Secondary | ICD-10-CM | POA: Diagnosis not present

## 2021-02-05 DIAGNOSIS — Z006 Encounter for examination for normal comparison and control in clinical research program: Secondary | ICD-10-CM | POA: Diagnosis present

## 2021-02-05 DIAGNOSIS — C771 Secondary and unspecified malignant neoplasm of intrathoracic lymph nodes: Secondary | ICD-10-CM | POA: Diagnosis not present

## 2021-02-05 DIAGNOSIS — R911 Solitary pulmonary nodule: Secondary | ICD-10-CM | POA: Diagnosis not present

## 2021-02-05 LAB — CMP (CANCER CENTER ONLY)
ALT: 17 U/L (ref 0–44)
AST: 16 U/L (ref 15–41)
Albumin: 4.2 g/dL (ref 3.5–5.0)
Alkaline Phosphatase: 94 U/L (ref 38–126)
Anion gap: 10 (ref 5–15)
BUN: 10 mg/dL (ref 8–23)
CO2: 27 mmol/L (ref 22–32)
Calcium: 10.6 mg/dL — ABNORMAL HIGH (ref 8.9–10.3)
Chloride: 103 mmol/L (ref 98–111)
Creatinine: 1.13 mg/dL (ref 0.61–1.24)
GFR, Estimated: >60 mL/min (ref >60)
Glucose, Bld: 146 mg/dL — ABNORMAL HIGH (ref 70–99)
Potassium: 3.9 mmol/L (ref 3.5–5.1)
Sodium: 140 mmol/L (ref 135–145)
Total Bilirubin: 0.6 mg/dL (ref 0.3–1.2)
Total Protein: 6.9 g/dL (ref 6.5–8.1)

## 2021-02-05 LAB — MAGNESIUM: Magnesium: 1.6 mg/dL — ABNORMAL LOW (ref 1.7–2.4)

## 2021-02-05 LAB — CBC WITH DIFFERENTIAL (CANCER CENTER ONLY)
Abs Immature Granulocytes: 0.03 10*3/uL (ref 0.00–0.07)
Basophils Absolute: 0.1 10*3/uL (ref 0.0–0.1)
Basophils Relative: 1 %
Eosinophils Absolute: 0.4 10*3/uL (ref 0.0–0.5)
Eosinophils Relative: 5 %
HCT: 46.2 % (ref 39.0–52.0)
Hemoglobin: 15.4 g/dL (ref 13.0–17.0)
Immature Granulocytes: 0 %
Lymphocytes Relative: 20 %
Lymphs Abs: 1.6 10*3/uL (ref 0.7–4.0)
MCH: 29.8 pg (ref 26.0–34.0)
MCHC: 33.3 g/dL (ref 30.0–36.0)
MCV: 89.4 fL (ref 80.0–100.0)
Monocytes Absolute: 0.7 10*3/uL (ref 0.1–1.0)
Monocytes Relative: 9 %
Neutro Abs: 5.1 10*3/uL (ref 1.7–7.7)
Neutrophils Relative %: 65 %
Platelet Count: 215 10*3/uL (ref 150–400)
RBC: 5.17 MIL/uL (ref 4.22–5.81)
RDW: 13.7 % (ref 11.5–15.5)
WBC Count: 7.9 10*3/uL (ref 4.0–10.5)
nRBC: 0 % (ref 0.0–0.2)

## 2021-02-05 MED ORDER — IOHEXOL 350 MG/ML SOLN
75.0000 mL | Freq: Once | INTRAVENOUS | Status: AC | PRN
  Administered 2021-02-05: 60 mL via INTRAVENOUS

## 2021-02-05 NOTE — Telephone Encounter (Signed)
[id number] study: Informed wife that research labs were unfortunately not collected today at the lab appointment as ordered. Asked wife if okay to add a lab appointment to Dr. Worthy Flank appointment next week and she said she thinks patient would be okay to do that.  The appointment will be added after he sees Julien Nordmann so they do not have to come into clinic earlier.  Thanked wife for her time and plan to see them next week.  Asked her to call back if any questions prior to next visit. She verbalized understanding.  Foye Spurling, BSN, RN Clinical Research Nurse 02/05/2021 2:15 PM

## 2021-02-11 ENCOUNTER — Other Ambulatory Visit: Payer: Self-pay

## 2021-02-11 ENCOUNTER — Inpatient Hospital Stay: Payer: Medicare Other

## 2021-02-11 ENCOUNTER — Inpatient Hospital Stay: Payer: Medicare Other | Admitting: Internal Medicine

## 2021-02-11 ENCOUNTER — Encounter: Payer: Self-pay | Admitting: *Deleted

## 2021-02-11 VITALS — BP 149/82 | HR 100 | Temp 97.2°F | Resp 20 | Ht 70.0 in | Wt 182.7 lb

## 2021-02-11 DIAGNOSIS — C3412 Malignant neoplasm of upper lobe, left bronchus or lung: Secondary | ICD-10-CM | POA: Diagnosis not present

## 2021-02-11 DIAGNOSIS — Z006 Encounter for examination for normal comparison and control in clinical research program: Secondary | ICD-10-CM | POA: Diagnosis not present

## 2021-02-11 DIAGNOSIS — C771 Secondary and unspecified malignant neoplasm of intrathoracic lymph nodes: Secondary | ICD-10-CM | POA: Diagnosis present

## 2021-02-11 DIAGNOSIS — C3492 Malignant neoplasm of unspecified part of left bronchus or lung: Secondary | ICD-10-CM

## 2021-02-11 DIAGNOSIS — C3490 Malignant neoplasm of unspecified part of unspecified bronchus or lung: Secondary | ICD-10-CM

## 2021-02-11 LAB — RESEARCH LABS

## 2021-02-11 NOTE — Research (Signed)
[id number] Follow Up Visit: Patient into clinic with his wife today for every 3 month follow up visit on study.  PROs: Not required this visit.  Labs: Optional blood specimens collected today per protocol.  CT Scan of Chest: Completed on 02/05/21 per Dr. Julien Nordmann. This was not a study timed/required CT scan.  Previous scan 11/05/20 showed possible suspicious area (presumed to be scarring but radiologist recommended close follow up) and Dr. Julien Nordmann ordered this CT 3 months later to follow more closely as recommended.    This recent CT 02/05/21 is suspicious for recurrence, but not definitive and Dr. Julien Nordmann has ordered PET scan to be done in the next few weeks to look closer.  H & P: Patient seen by Dr. Julien Nordmann today.  See MD encounter dated today for details of visit.  AE Review: Patient denies any serious adverse events since last study visit.  Plan:  Patient will have PET scan and return to see Dr. Julien Nordmann in approximately 2 weeks. Thanked patient for his ongoing participation and support of this study.  Encouraged him to call if any questions or concerns prior to next visit. He verbalized understanding.  Foye Spurling, BSN, RN Clinical Research Nurse 02/11/2021 9:56 AM

## 2021-02-11 NOTE — Progress Notes (Signed)
[place] [phone]   Fax:([phone]) [place], MD [address] [place] [address]  DIAGNOSIS: stage IIIA (T2a, N2, M0) non-small cell lung cancer, adenocarcinoma.  Patient presented with a left upper lobe nodule. He was diagnosed in March 2021   Biomarkers/PDL1:   Detected EGFR mutation in exon 21 Positive for the EGFR P848L mutation in exon 21. No mutation was detected in exons 12, 18, 19, and 20. The clinical significance of P848L in exon 21 is uncertain. This variant has been reported to behave like functionally silent polymorphism. [name], M.M., et al. Mol Cancer 6, 56 (2007). PD-L1 Expression Level: TPS < 1% PD-L1 Expression Status: No Expression NEGATIVE FOR ALK REARRANGEMENT   PRIOR THERAPY: 1) Robotic assisted wedge resection of the left upper lobe under the care of Dr. Servando Snare.  This was performed on 10/28/2019. 2) Adjuvant systemic chemotherapy on the [facility] A [id number] protocol with cisplatin 70 mg/M2, pemetrexed 500 mg/M2 every 3 weeks in addition to pembrolizumab 400 mg IV every 6 weeks.  Start date December 19, 2019.  Status post 2 cycles.  This was discontinued secondary to intolerance.   CURRENT THERAPY:  Observation.  INTERVAL HISTORY: Chad Gomez 75 y.o. male returns to the clinic today for follow-up visit accompanied by his wife.  The patient is feeling fine today with no concerning complaints except for mild fatigue.  He denied having any current chest pain, shortness of breath, cough or hemoptysis.  He denied having any fever or chills.  He has no nausea, vomiting, diarrhea or constipation.  He has no headache or visual changes.  Unfortunately he continues to smoke at regular basis.  The patient denied having any weight loss or night sweats.  He is here today for evaluation with repeat CT scan of the chest for restaging of his disease.   MEDICAL HISTORY: Past Medical History:   Diagnosis Date   BPH (benign prostatic hypertrophy)    weak stream   Chronic kidney disease    Complication of anesthesia    urinary retention   COPD (chronic obstructive pulmonary disease) (HCC)    Diabetes mellitus without complication (HCC)    Type II   History of bladder stone    History of colon polyps    08/2013   pre-canerous   History of kidney stones    History of melanoma excision    LEFT EAR   Hyperlipidemia    Hypertension    Iron deficiency    Lung cancer, lingula ([place]) 11/01/2019   Open wound    left lower jaw; followed by ENT   Peripheral vascular disease ([place])    Right ureteral stone    Shingles 05-15-2015    ALLERGIES:  is allergic to morphine and related, tape, amoxicillin, and naproxen.  MEDICATIONS:  Current Outpatient Medications  Medication Sig Dispense Refill   acetaminophen (TYLENOL) 500 MG tablet Take 2 tablets (1,000 mg total) by mouth every 6 (six) hours as needed for mild pain. 30 tablet 0   albuterol (VENTOLIN HFA) 108 (90 Base) MCG/ACT inhaler Inhale 2 puffs into the lungs every 6 (six) hours as needed for wheezing or shortness of breath. 8 g 3   aspirin EC 81 MG tablet Take 81 mg by mouth daily.     atorvastatin (LIPITOR) 40 MG tablet Take 40 mg by mouth daily.     Cholecalciferol (VITAMIN D-3) 1000 units CAPS Take 1,000 Units  by mouth daily.      ezetimibe (ZETIA) 10 MG tablet Take 5 mg by mouth daily.     finasteride (PROSCAR) 5 MG tablet Take 5 mg by mouth daily.  (Patient not taking: Reported on 12/13/2020)     fluticasone (FLONASE) 50 MCG/ACT nasal spray Place 2 sprays into both nostrils daily.      folic acid (FOLVITE) 1 MG tablet Take 1 mg by mouth daily. (Patient not taking: Reported on 12/13/2020)     gabapentin (NEURONTIN) 100 MG capsule Take 200 mg by mouth 3 (three) times daily. Taking 529m daily     metFORMIN (GLUCOPHAGE-XR) 500 MG 24 hr tablet Take 1,000 mg by mouth See admin instructions. Take 10058mevery morning and take 1000 mg  at supper     potassium citrate (UROCIT-K) 10 MEQ (1080 MG) SR tablet Take 10 mEq by mouth in the morning and at bedtime.      Tiotropium Bromide-Olodaterol (STIOLTO RESPIMAT) 2.5-2.5 MCG/ACT AERS Inhale 2 puffs into the lungs daily. 12 g 3   traMADol (ULTRAM) 50 MG tablet Take 1 tablet (50 mg total) by mouth every 6 (six) hours as needed (mild pain). 20 tablet 0   No current facility-administered medications for this visit.    SURGICAL HISTORY:  Past Surgical History:  Procedure Laterality Date   ABDOMINAL AORTAGRAM  11/01/2014   Procedure: Abdominal Aortagram;  Surgeon: VaSerafina MitchellMD;  [facility];  Service: Cardiovascular;;   AORTA - BILATERAL FEMORAL ARTERY BYPASS GRAFT N/A 12/28/2014   Procedure: AORTOBIFEMORAL BYPASS GRAFT;  Surgeon: VaSerafina MitchellMD;  [facility] Service: Vascular;  Laterality: N/A;   BRONCHIAL BRUSHINGS  09/06/2019   Procedure: BRONCHIAL BRUSHINGS;  Surgeon: ByCollene GobbleMD;  [facility];  Service: Pulmonary;;  left upper lobe nodule   BRONCHIAL WASHINGS  09/06/2019   Procedure: BRONCHIAL WASHINGS;  Surgeon: ByCollene GobbleMD;  [facility];  Service: Pulmonary;;   COLONOSCOPY W/ POLYPECTOMY  09-13-2013   CYSTOLITHALOPAXY OF BLADDER STONE/ TRANSRECTAL ULTRASOUND PROSTATE BX  08-19-2005   CYSTOSCOPY W/ RETROGRADES Right 12/02/2012   Procedure: CYSTOSCOPY WITH RETROGRADE PYELOGRAM;  Surgeon: JoMalka SoMD;  [facility];  Laterality: Right;   CYSTOSCOPY W/ RETROGRADES Bilateral 10/13/2013   Procedure: CYSTOSCOPY WITH RETROGRADE PYELOGRAM;  Surgeon: JoIrine SealMD;  [facility];  Laterality: Bilateral;   CYSTOSCOPY W/ URETERAL STENT PLACEMENT Left 04/11/2014   Procedure: CYSTOSCOPY WITH STENT REPLACEMENT;  Surgeon: JoMalka SoMD;  [facility]: [facility];  Laterality: Left;   CYSTOSCOPY WITH  LITHOLAPAXY Right 12/02/2012   Procedure: CYSTOSCOPY WITH stone extraction;  Surgeon: JoMalka SoMD;  [facility];  Laterality: Right;   CYSTOSCOPY WITH LITHOLAPAXY Right 10/13/2013   Procedure: CYSTOSCOPY WITH LITHOLAPAXY, removal of urethral stone with basket;  Surgeon: JoIrine SealMD;  [facility];  Laterality: Right;   CYSTOSCOPY WITH RETROGRADE PYELOGRAM, URETEROSCOPY AND STENT PLACEMENT Left 03/21/2014   Procedure: CYSTOSCOPY WITH RETROGRADE PYELOGRAM, AND LEFT STENT PLACEMENT;  Surgeon: ThAlexis FrockMD;  [facility]: [facility];  Service: Urology;  Laterality: Left;   CYSTOSCOPY WITH RETROGRADE PYELOGRAM, URETEROSCOPY AND STENT PLACEMENT  09/09/2016   Procedure: CYSTOSCOPY WITH RIGHT  RETROGRADE PYELOGRAM, URETEROSCOPY WITH LASER BASKET EXTRACTION OF STONES  AND STENT PLACEMENT;  Surgeon: JoIrine SealMD;  Location:  [place];  Service: Urology;;   CYSTOSCOPY WITH URETEROSCOPY Left 04/11/2014   Procedure: CYSTOSCOPY WITH URETEROSCOPY/ STONE EXTRACTION;  Surgeon: Malka So, MD;  Location[facility];  Service: Urology;  Laterality: Left;   FINE NEEDLE ASPIRATION  09/06/2019   Procedure: FINE NEEDLE ASPIRATION;  Surgeon: Collene Gobble, MD;  Location: [facility] ENDOSCOPY;  Service: Pulmonary;;  left upper lobe - lung   HOLMIUM LASER APPLICATION Right 12/22/6376   Procedure: HOLMIUM LASER APPLICATION;  Surgeon: Malka So, MD;  Location[facility];  Service: Urology;  Laterality: Right;   HOLMIUM LASER APPLICATION Left 58/85/0277   Procedure: HOLMIUM LASER APPLICATION;  Surgeon: Malka So, MD;  Location: [facility];  Service: Urology;  Laterality: Left;   HOLMIUM LASER APPLICATION Right 10/02/8784   Procedure: HOLMIUM LASER APPLICATION;  Surgeon: Irine Seal, MD;  Location: [facility];  Service: Urology;  Laterality: Right;   INGUINAL  HERNIA REPAIR Right 1954   INTERCOSTAL NERVE BLOCK Left 10/28/2019   Procedure: Intercostal Nerve Block;  Surgeon: [name], MD;  Location: [place];  Service: Thoracic;  Laterality: Left;   LITHOTRIPSY  2007 & 02/2013   LUNG BIOPSY  09/06/2019   Procedure: LUNG BIOPSY;  Surgeon: Collene Gobble, MD;  Location: [facility] ENDOSCOPY;  Service: Pulmonary;;  left upper lob lung   MANDIBLE FRACTURE SURGERY  1995   MICROLARYNGOSCOPY Right 02/18/2016   Procedure: MICRO DIRECT LARYNGOSCOPY EXCISIONAL BIOPSY OF RIGHT VOCAL CORD MASS;  Surgeon: Leta Baptist, MD;  Location: [place];  Service: ENT;  Laterality: Right;   [place]   left ear   MULTIPLE TOOTH [employer]   NEPHROLITHOTOMY Left 01/27/2013   Procedure: NEPHROLITHOTOMY PERCUTANEOUS FIRST LOOK;  Surgeon: Malka So, MD;  [facility];  Service: Urology;  Laterality: Left;   NODE DISSECTION  10/28/2019   Procedure: Node Dissection;  Surgeon: [name], MD;  Location: [place];  Service: Thoracic;;   PERIPHERAL VASCULAR CATHETERIZATION N/A 11/01/2014   Procedure: Lower Extremity Angiography;  Surgeon: Serafina Mitchell, MD;  Location: [place] CV LAB;  Service: Cardiovascular;  Laterality: N/A;   URETEROSCOPY Right 12/02/2012   Procedure: RIGHT URETEROSCOPY STONE EXTRACTION WITH  STENT PLACEMENT;  Surgeon: Malka So, MD;  [facility];  Service: Urology;  Laterality: Right;   VIDEO BRONCHOSCOPY  10/28/2019   VIDEO BRONCHOSCOPY WITH ENDOBRONCHIAL NAVIGATION WITH LUNG MARKINGS (N/A   VIDEO BRONCHOSCOPY WITH ENDOBRONCHIAL NAVIGATION N/A 09/06/2019   Procedure: VIDEO BRONCHOSCOPY WITH ENDOBRONCHIAL NAVIGATION;  Surgeon: Collene Gobble, MD;  Location: [place] ENDOSCOPY;  Service: Pulmonary;  Laterality: N/A;   VIDEO BRONCHOSCOPY WITH ENDOBRONCHIAL NAVIGATION N/A 10/28/2019   Procedure: VIDEO BRONCHOSCOPY WITH ENDOBRONCHIAL NAVIGATION WITH LUNG MARKINGS;  Surgeon: [name], MD;  Location: [place];  Service: Thoracic;  Laterality: N/A;   WEDGE RESECTION  10/28/2019   LEFT UPPER LOBE    REVIEW OF SYSTEMS:  Constitutional: positive for fatigue Eyes: negative Ears, nose, mouth, throat, and face: negative Respiratory: negative Cardiovascular: negative Gastrointestinal: negative Genitourinary:negative Integument/breast: negative Hematologic/lymphatic: negative Musculoskeletal:negative Neurological: negative Behavioral/Psych: negative Endocrine: negative Allergic/Immunologic: negative   PHYSICAL EXAMINATION: General appearance: alert, cooperative, fatigued, and no distress Head: Normocephalic, without obvious abnormality, atraumatic Neck: no adenopathy, no JVD, supple, symmetrical, trachea midline, and thyroid not enlarged, symmetric, no tenderness/mass/nodules Lymph nodes: Cervical, supraclavicular, and axillary nodes normal. Resp: clear to auscultation bilaterally Back: symmetric,  no curvature. ROM normal. No CVA tenderness. Cardio: regular rate and rhythm, S1, S2 normal, no murmur, click, rub or gallop GI: Large abdominal hernia Extremities: extremities normal, atraumatic, no cyanosis or edema Neurologic: Alert and oriented X 3, normal strength and tone. Normal symmetric reflexes. Normal coordination and gait  ECOG PERFORMANCE STATUS: 1 - Symptomatic but completely ambulatory  Blood pressure (!) 149/82, pulse 100, temperature (!) 97.2 F (36.2 C), temperature source Tympanic, resp. rate 20, height 5' 10"  (1.778 m), weight 182 lb 11.2 oz (82.9 kg), SpO2 95 %.  LABORATORY DATA: Lab Results  Component Value Date   WBC 7.9 02/05/2021   HGB 15.4 02/05/2021   HCT 46.2 02/05/2021   MCV 89.4 02/05/2021   PLT 215 02/05/2021      Chemistry      Component Value Date/Time   NA 140 02/05/2021 0940   K 3.9 02/05/2021 0940   CL 103 02/05/2021 0940   CO2 27 02/05/2021 0940   BUN 10 02/05/2021 0940   CREATININE 1.13 02/05/2021 0940      Component Value Date/Time    CALCIUM 10.6 (H) 02/05/2021 0940   ALKPHOS 94 02/05/2021 0940   AST 16 02/05/2021 0940   ALT 17 02/05/2021 0940   BILITOT 0.6 02/05/2021 0940       RADIOGRAPHIC STUDIES: CT Chest W Contrast  Result Date: 02/05/2021 CLINICAL DATA:  Non-small cell lung cancer.  Restaging EXAM: CT CHEST WITH CONTRAST TECHNIQUE: Multidetector CT imaging of the chest was performed during intravenous contrast administration. CONTRAST:  1m OMNIPAQUE IOHEXOL 350 MG/ML SOLN COMPARISON:  11/05/2020 FINDINGS: Cardiovascular: Heart size and mediastinal contours are unremarkable. Aortic atherosclerosis. Coronary artery calcifications. No pericardial effusion. Mediastinum/Nodes: Normal appearance of the thyroid gland. The trachea appears patent and is midline. Small hiatal hernia. Mild circumferential wall thickening involving the distal esophagus is again noted. No enlarged axillary, supraclavicular, mediastinal, or hilar lymph nodes. Lungs/Pleura: Status post wedge resection from the left upper lobe. Focal area of nodular soft tissue surrounding the suture chain is again seen. This measures 1.9 x 2.0 cm, image 80/7. Stable when compared with the previous examination. Interval increase in size of subpleural nodule within the anterior aspect of the inferior left upper lobe. This measures 0.7 cm, image 102/7. Formally 3 mm. Pleural base nodule within the paravertebral left lower lobe posterior to the descending thoracic aorta measures 1.6 x 1.3 cm, image 127/2. On the previous exam this measured 1.0 by 0.8 cm. Upper Abdomen: Asymmetric right renal atrophy is again noted. Small left adrenal nodule measures 1 cm in appears stable, image 170/2. Gallstones. Stable low-density structure in posterior right hepatic lobe measuring 9 mm, image 174/2. Musculoskeletal: No chest wall abnormality. No acute or significant osseous findings. IMPRESSION: 1. Stable postsurgical changes within the left upper lobe. Similar appearance of focal nodular  soft tissue surrounding the suture chain. 2. Peripheral nodule within the left upper lobe has increased in size from previous exam and is suspicious for a new site of disease. 3. Within the paravertebral aspect of the posteromedial left lower lobe there is a enlarging subpleural nodule which is also suspicious for new site of disease. 4. Stable 1 cm left adrenal nodule. Aortic Atherosclerosis (ICD10-I70.0). Electronically Signed   By: TKerby MoorsM.D.   On: 02/05/2021 16:13     ASSESSMENT AND PLAN: This is a very pleasant 75years old white male recently diagnosed with stage IIIa (T2 a, N2, M0) non-small cell lung cancer, adenocarcinoma presented with left upper lobe  lung nodule and after resection the patient was found to have metastatic disease to the mediastinal lymphadenopathy.  This was diagnosed in March 2021.   He is currently undergoing adjuvant systemic chemotherapy with cisplatin, pemetrexed and pembrolizumab status post 2 cycles. His treatment was discontinued secondary to significant intolerability to this treatment with frequent admission to the hospital with enteritis, acute renal failure as well as pancytopenia. The patient is currently on observation and he is feeling fine today with no concerning complaints except for mild fatigue. He had repeat CT scan of the chest performed recently.  I personally and independently reviewed the scan images and discussed the results with the patient and his wife today. Unfortunately his scan showed increase in the size of a peripheral nodule within the left upper lobe suspicious for new site of disease. I recommended for the patient to have a PET scan for further evaluation of this lesion and to rule out any disease recurrence. I will see the patient back for follow-up visit in 2 weeks for evaluation and discussion of the PET scan results and further recommendation regarding his condition. For the smoking, I strongly encouraged the patient to quit  smoking. The patient was advised to call immediately if he has any concerning symptoms in the interval. The patient voices understanding of current disease status and treatment options and is in agreement with the current care plan. All questions were answered. The patient knows to call the clinic with any problems, questions or concerns. We can certainly see the patient much sooner if necessary.  Disclaimer: This note was dictated with voice recognition software. Similar sounding words can inadvertently be transcribed and may not be corrected upon review.

## 2021-02-14 ENCOUNTER — Telehealth: Payer: Self-pay | Admitting: Internal Medicine

## 2021-02-14 NOTE — Telephone Encounter (Signed)
Scheduled per los. Called and left msg.

## 2021-02-15 DIAGNOSIS — R338 Other retention of urine: Secondary | ICD-10-CM | POA: Diagnosis not present

## 2021-02-19 ENCOUNTER — Other Ambulatory Visit: Payer: Self-pay

## 2021-02-19 ENCOUNTER — Ambulatory Visit (HOSPITAL_COMMUNITY)
Admission: RE | Admit: 2021-02-19 | Discharge: 2021-02-19 | Disposition: A | Discharge status: Home / Self Care | Payer: Medicare Other | Source: Ambulatory Visit | Attending: Internal Medicine | Admitting: Internal Medicine

## 2021-02-19 DIAGNOSIS — Z79899 Other long term (current) drug therapy: Secondary | ICD-10-CM | POA: Diagnosis not present

## 2021-02-19 DIAGNOSIS — J90 Pleural effusion, not elsewhere classified: Secondary | ICD-10-CM | POA: Diagnosis not present

## 2021-02-19 DIAGNOSIS — C3490 Malignant neoplasm of unspecified part of unspecified bronchus or lung: Secondary | ICD-10-CM | POA: Insufficient documentation

## 2021-02-19 LAB — GLUCOSE, CAPILLARY: Glucose-Capillary: 115 mg/dL — ABNORMAL HIGH (ref 70–99)

## 2021-02-19 MED ORDER — FLUDEOXYGLUCOSE F - 18 (FDG) INJECTION
9.4000 | Freq: Once | INTRAVENOUS | Status: AC
  Administered 2021-02-19: 9.7 via INTRAVENOUS

## 2021-02-26 ENCOUNTER — Telehealth: Payer: Self-pay | Admitting: Internal Medicine

## 2021-02-26 NOTE — Telephone Encounter (Signed)
R/s appt per 99/6 sch msg. Pt aware.

## 2021-02-28 ENCOUNTER — Inpatient Hospital Stay: Payer: Medicare Other | Admitting: Internal Medicine

## 2021-03-13 DIAGNOSIS — H2512 Age-related nuclear cataract, left eye: Secondary | ICD-10-CM | POA: Diagnosis not present

## 2021-03-13 DIAGNOSIS — H25012 Cortical age-related cataract, left eye: Secondary | ICD-10-CM | POA: Diagnosis not present

## 2021-03-13 DIAGNOSIS — H25812 Combined forms of age-related cataract, left eye: Secondary | ICD-10-CM | POA: Diagnosis not present

## 2021-03-15 ENCOUNTER — Encounter: Payer: Self-pay | Admitting: *Deleted

## 2021-03-15 NOTE — Telephone Encounter (Signed)
This encounter was created in error - please disregard.

## 2021-03-18 ENCOUNTER — Encounter: Payer: Self-pay | Admitting: Internal Medicine

## 2021-03-18 ENCOUNTER — Inpatient Hospital Stay: Payer: Medicare Other | Attending: Physician Assistant | Admitting: Internal Medicine

## 2021-03-18 ENCOUNTER — Other Ambulatory Visit: Payer: Self-pay

## 2021-03-18 VITALS — BP 136/76 | HR 93 | Temp 96.6°F | Resp 17 | Wt 182.2 lb

## 2021-03-18 DIAGNOSIS — Z87442 Personal history of urinary calculi: Secondary | ICD-10-CM | POA: Diagnosis not present

## 2021-03-18 DIAGNOSIS — C3412 Malignant neoplasm of upper lobe, left bronchus or lung: Secondary | ICD-10-CM | POA: Diagnosis not present

## 2021-03-18 DIAGNOSIS — C3492 Malignant neoplasm of unspecified part of left bronchus or lung: Secondary | ICD-10-CM | POA: Diagnosis not present

## 2021-03-18 DIAGNOSIS — C771 Secondary and unspecified malignant neoplasm of intrathoracic lymph nodes: Secondary | ICD-10-CM | POA: Insufficient documentation

## 2021-03-18 DIAGNOSIS — R82998 Other abnormal findings in urine: Secondary | ICD-10-CM | POA: Diagnosis not present

## 2021-03-18 DIAGNOSIS — C3410 Malignant neoplasm of upper lobe, unspecified bronchus or lung: Secondary | ICD-10-CM

## 2021-03-18 DIAGNOSIS — Z85820 Personal history of malignant melanoma of skin: Secondary | ICD-10-CM | POA: Diagnosis not present

## 2021-03-18 DIAGNOSIS — E1122 Type 2 diabetes mellitus with diabetic chronic kidney disease: Secondary | ICD-10-CM | POA: Insufficient documentation

## 2021-03-18 DIAGNOSIS — Z79899 Other long term (current) drug therapy: Secondary | ICD-10-CM | POA: Insufficient documentation

## 2021-03-18 DIAGNOSIS — I129 Hypertensive chronic kidney disease with stage 1 through stage 4 chronic kidney disease, or unspecified chronic kidney disease: Secondary | ICD-10-CM | POA: Diagnosis not present

## 2021-03-18 DIAGNOSIS — Z7984 Long term (current) use of oral hypoglycemic drugs: Secondary | ICD-10-CM | POA: Diagnosis not present

## 2021-03-18 DIAGNOSIS — N189 Chronic kidney disease, unspecified: Secondary | ICD-10-CM | POA: Insufficient documentation

## 2021-03-18 DIAGNOSIS — Z7982 Long term (current) use of aspirin: Secondary | ICD-10-CM | POA: Diagnosis not present

## 2021-03-18 DIAGNOSIS — R338 Other retention of urine: Secondary | ICD-10-CM | POA: Diagnosis not present

## 2021-03-18 DIAGNOSIS — Z006 Encounter for examination for normal comparison and control in clinical research program: Secondary | ICD-10-CM | POA: Insufficient documentation

## 2021-03-18 DIAGNOSIS — D61818 Other pancytopenia: Secondary | ICD-10-CM | POA: Insufficient documentation

## 2021-03-18 NOTE — Progress Notes (Signed)
Chad Gomez [phone]   Fax:([phone]) [place], MD [address] [place] [address]  DIAGNOSIS: stage IIIA (T2a, N2, M0) non-small cell lung cancer, adenocarcinoma.  Patient presented with a left upper lobe nodule. He was diagnosed in March 2021   Biomarkers/PDL1:   Detected EGFR mutation in exon 21 Positive for the EGFR P848L mutation in exon 21. No mutation was detected in exons 12, 18, 19, and 20. The clinical significance of P848L in exon 21 is uncertain. This variant has been reported to behave like functionally silent polymorphism. [name], M.M., et al. Mol Cancer 6, 56 (2007). PD-L1 Expression Level: TPS < 1% PD-L1 Expression Status: No Expression NEGATIVE FOR ALK REARRANGEMENT   PRIOR THERAPY: 1) Robotic assisted wedge resection of the left upper lobe under the care of Dr. Servando Snare.  This was performed on 10/28/2019. 2) Adjuvant systemic chemotherapy on the [facility] A [id number] protocol with cisplatin 70 mg/M2, pemetrexed 500 mg/M2 every 3 weeks in addition to pembrolizumab 400 mg IV every 6 weeks.  Start date December 19, 2019.  Status post 2 cycles.  This was discontinued secondary to intolerance.   CURRENT THERAPY:  Observation.  INTERVAL HISTORY: Chad Gomez 75 y.o. male returns to the clinic today for follow-up visit accompanied by his wife.  The patient is feeling fine today with no concerning complaints.  He had a cataract surgery performed recently.  He denied having any current chest pain, shortness of breath, cough or hemoptysis.  He denied having any fever or chills.  He has no nausea, vomiting, diarrhea or constipation.  He was found on previous CT scan of the chest to have suspicious disease recurrence.  I ordered a PET scan and the patient is here for evaluation and discussion of his scan results and recommendation regarding treatment of his condition.    MEDICAL HISTORY: Past  Medical History:  Diagnosis Date   BPH (benign prostatic hypertrophy)    weak stream   Chronic kidney disease    Complication of anesthesia    urinary retention   COPD (chronic obstructive pulmonary disease) (HCC)    Diabetes mellitus without complication (HCC)    Type II   History of bladder stone    History of colon polyps    08/2013   pre-canerous   History of kidney stones    History of melanoma excision    LEFT EAR   Hyperlipidemia    Hypertension    Iron deficiency    Lung cancer, lingula ([place]) 11/01/2019   Open wound    left lower jaw; followed by ENT   Peripheral vascular disease ([place])    Right ureteral stone    Shingles 05-15-2015    ALLERGIES:  is allergic to gabapentin, morphine and related, tape, allevyn adhesive [wound dressings], amoxicillin, and naproxen.  MEDICATIONS:  Current Outpatient Medications  Medication Sig Dispense Refill   acetaminophen (TYLENOL) 500 MG tablet Take 2 tablets (1,000 mg total) by mouth every 6 (six) hours as needed for mild pain. 30 tablet 0   albuterol (VENTOLIN HFA) 108 (90 Base) MCG/ACT inhaler Inhale 2 puffs into the lungs every 6 (six) hours as needed for wheezing or shortness of breath. 8 g 3   amLODipine (NORVASC) 10 MG tablet Take 10 mg by mouth daily.     aspirin EC 81 MG tablet Take 81 mg by mouth daily.     atorvastatin (LIPITOR) 40 MG  tablet Take 40 mg by mouth daily.     Cholecalciferol (VITAMIN D-3) 1000 units CAPS Take 1,000 Units by mouth daily.      ezetimibe (ZETIA) 10 MG tablet Take 5 mg by mouth daily.     finasteride (PROSCAR) 5 MG tablet Take 5 mg by mouth daily.  (Patient not taking: Reported on 12/13/2020)     fluticasone (FLONASE) 50 MCG/ACT nasal spray Place 2 sprays into both nostrils daily.      folic acid (FOLVITE) 1 MG tablet Take 1 mg by mouth daily. (Patient not taking: Reported on 12/13/2020)     gabapentin (NEURONTIN) 100 MG capsule Take 200 mg by mouth 3 (three) times daily. Taking 510m daily      indapamide (LOZOL) 1.25 MG tablet Take 1.25 mg by mouth daily.     metFORMIN (GLUCOPHAGE-XR) 500 MG 24 hr tablet Take 1,000 mg by mouth See admin instructions. Take 10022mevery morning and take 1000 mg at supper     olmesartan (BENICAR) 20 MG tablet Take 20 mg by mouth daily.     potassium citrate (UROCIT-K) 10 MEQ (1080 MG) SR tablet Take 10 mEq by mouth in the morning and at bedtime.      Tiotropium Bromide-Olodaterol (STIOLTO RESPIMAT) 2.5-2.5 MCG/ACT AERS Inhale 2 puffs into the lungs daily. 12 g 3   traMADol (ULTRAM) 50 MG tablet Take 1 tablet (50 mg total) by mouth every 6 (six) hours as needed (mild pain). 20 tablet 0   No current facility-administered medications for this visit.    SURGICAL HISTORY:  Past Surgical History:  Procedure Laterality Date   ABDOMINAL AORTAGRAM  11/01/2014   Procedure: Abdominal Aortagram;  Surgeon: VaSerafina MitchellMD;  [facility];  Service: Cardiovascular;;   AORTA - BILATERAL FEMORAL ARTERY BYPASS GRAFT N/A 12/28/2014   Procedure: AORTOBIFEMORAL BYPASS GRAFT;  Surgeon: VaSerafina MitchellMD;  [facility]: Vascular;  Laterality: N/A;   BRONCHIAL BRUSHINGS  09/06/2019   Procedure: BRONCHIAL BRUSHINGS;  Surgeon: ByCollene GobbleMD;  [facility];  Service: Pulmonary;;  left upper lobe nodule   BRONCHIAL WASHINGS  09/06/2019   Procedure: BRONCHIAL WASHINGS;  Surgeon: ByCollene GobbleMD;  [facility];  Service: Pulmonary;;   COLONOSCOPY W/ POLYPECTOMY  09-13-2013   CYSTOLITHALOPAXY OF BLADDER STONE/ TRANSRECTAL ULTRASOUND PROSTATE BX  08-19-2005   CYSTOSCOPY W/ RETROGRADES Right 12/02/2012   Procedure: CYSTOSCOPY WITH RETROGRADE PYELOGRAM;  Surgeon: JoMalka SoMD;  [facility]: [facility];  Laterality: Right;   CYSTOSCOPY W/ RETROGRADES Bilateral 10/13/2013   Procedure: CYSTOSCOPY WITH RETROGRADE PYELOGRAM;  Surgeon: JoIrine SealMD;  [facility]  Service: Urology;  Laterality: Bilateral;   CYSTOSCOPY W/ URETERAL STENT PLACEMENT Left 04/11/2014   Procedure: CYSTOSCOPY WITH STENT REPLACEMENT;  Surgeon: JoMalka SoMD;  [facility] Service: Urology;  Laterality: Left;   CYSTOSCOPY WITH LITHOLAPAXY Right 12/02/2012   Procedure: CYSTOSCOPY WITH stone extraction;  Surgeon: JoMalka SoMD;  [facility] Service: [facility];  Laterality: Right;   CYSTOSCOPY WITH LITHOLAPAXY Right 10/13/2013   Procedure: CYSTOSCOPY WITH LITHOLAPAXY, removal of urethral stone with basket;  Surgeon: JoIrine SealMD;  [facility]: [facility];  Laterality: Right;   CYSTOSCOPY WITH RETROGRADE PYELOGRAM, URETEROSCOPY AND STENT PLACEMENT Left 03/21/2014   Procedure: CYSTOSCOPY WITH RETROGRADE PYELOGRAM, AND LEFT STENT PLACEMENT;  Surgeon: ThAlexis Frock  MD;  [facility];  Service: Urology;  Laterality: Left;   CYSTOSCOPY WITH RETROGRADE PYELOGRAM, URETEROSCOPY AND STENT PLACEMENT  09/09/2016   Procedure: CYSTOSCOPY WITH RIGHT  RETROGRADE PYELOGRAM, URETEROSCOPY WITH LASER BASKET EXTRACTION OF STONES  AND STENT PLACEMENT;  Surgeon: Irine Seal, MD;  [facility];  Service: Urology;;   CYSTOSCOPY WITH URETEROSCOPY Left 04/11/2014   Procedure: CYSTOSCOPY WITH URETEROSCOPY/ STONE EXTRACTION;  Surgeon: Malka So, MD;  Location: [facility];  Service: Urology;  Laterality: Left;   FINE NEEDLE ASPIRATION  09/06/2019   Procedure: FINE NEEDLE ASPIRATION;  Surgeon: Collene Gobble, MD;  Location: [facility] ENDOSCOPY;  Service: Pulmonary;;  left upper lobe - lung   HOLMIUM LASER APPLICATION Right 6/38/7564   Procedure: HOLMIUM LASER APPLICATION;  Surgeon: Malka So, MD;  Location: [facility];  Service: Urology;  Laterality: Right;   HOLMIUM LASER APPLICATION Left 33/29/5188   Procedure: HOLMIUM LASER APPLICATION;  Surgeon: Malka So, MD;   Location: [facility];  Service: Urology;  Laterality: Left;   HOLMIUM LASER APPLICATION Right 10/06/6061   Procedure: HOLMIUM LASER APPLICATION;  Surgeon: Irine Seal, MD;  Location: [facility];  Service: Urology;  Laterality: Right;   INGUINAL HERNIA REPAIR Right 1954   INTERCOSTAL NERVE BLOCK Left 10/28/2019   Procedure: Intercostal Nerve Block;  Surgeon: [name], MD;  Location: [place];  Service: Thoracic;  Laterality: Left;   LITHOTRIPSY  2007 & 02/2013   LUNG BIOPSY  09/06/2019   Procedure: LUNG BIOPSY;  Surgeon: Collene Gobble, MD;  Location: [facility]COPY;  Service: Pulmonary;;  left upper lob lung   MANDIBLE FRACTURE SURGERY  1995   MICROLARYNGOSCOPY Right 02/18/2016   Procedure: MICRO DIRECT LARYNGOSCOPY EXCISIONAL BIOPSY OF RIGHT VOCAL CORD MASS;  Surgeon: Leta Baptist, MD;  Location: [place];  Service: ENT;  Laterality: Right;   [place]   left ear   MULTIPLE TOOTH [place]   NEPHROLITHOTOMY Left 01/27/2013   Procedure: NEPHROLITHOTOMY PERCUTANEOUS FIRST LOOK;  Surgeon: Malka So, MD;  [facility];  Service: Urology;  Laterality: Left;   NODE DISSECTION  10/28/2019   Procedure: Node Dissection;  Surgeon: [name], MD;  Location: [place];  Service: Thoracic;;   PERIPHERAL VASCULAR CATHETERIZATION N/A 11/01/2014   Procedure: Lower Extremity Angiography;  Surgeon: Serafina Mitchell, MD;  Location: [place] [facility];  Service: Cardiovascular;  Laterality: N/A;   URETEROSCOPY Right 12/02/2012   Procedure: RIGHT URETEROSCOPY STONE EXTRACTION WITH  STENT PLACEMENT;  Surgeon: Malka So, MD;  Location: [facility];  Service: Urology;  Laterality: Right;   VIDEO BRONCHOSCOPY  10/28/2019   VIDEO BRONCHOSCOPY WITH ENDOBRONCHIAL NAVIGATION WITH LUNG MARKINGS (N/A   VIDEO BRONCHOSCOPY WITH ENDOBRONCHIAL NAVIGATION N/A 09/06/2019   Procedure: VIDEO BRONCHOSCOPY WITH ENDOBRONCHIAL NAVIGATION;  Surgeon:  Collene Gobble, MD;  Location: [place] ENDOSCOPY;  Service: Pulmonary;  Laterality: N/A;   VIDEO BRONCHOSCOPY WITH ENDOBRONCHIAL NAVIGATION N/A 10/28/2019   Procedure: VIDEO BRONCHOSCOPY WITH ENDOBRONCHIAL NAVIGATION WITH LUNG MARKINGS;  Surgeon: [name], MD;  Location: [place];  Service: Thoracic;  Laterality: N/A;   WEDGE RESECTION  10/28/2019   LEFT UPPER LOBE    REVIEW OF SYSTEMS:  Constitutional: negative Eyes: negative Ears, nose, mouth, throat, and face: negative Respiratory: negative Cardiovascular: negative Gastrointestinal: negative Genitourinary:negative Integument/breast: negative Hematologic/lymphatic: negative Musculoskeletal:negative Neurological: negative Behavioral/Psych: negative Endocrine: negative Allergic/Immunologic: negative  PHYSICAL EXAMINATION: General appearance: alert, cooperative, fatigued, and no distress Head: Normocephalic, without obvious abnormality, atraumatic Neck: no adenopathy, no JVD, supple, symmetrical, trachea midline, and thyroid not enlarged, symmetric, no tenderness/mass/nodules Lymph nodes: Cervical, supraclavicular, and axillary nodes normal. Resp: clear to auscultation bilaterally Back: symmetric, no curvature. ROM normal. No CVA tenderness. Cardio: regular rate and rhythm, S1, S2 normal, no murmur, click, rub or gallop GI: Large abdominal hernia Extremities: extremities normal, atraumatic, no cyanosis or edema Neurologic: Alert and oriented X 3, normal strength and tone. Normal symmetric reflexes. Normal coordination and gait  ECOG PERFORMANCE STATUS: 1 - Symptomatic but completely ambulatory  Blood pressure 136/76, pulse 93, temperature (!) 96.6 F (35.9 C), resp. rate 17, weight 182 lb 4 oz (82.7 kg), SpO2 98 %.  LABORATORY DATA: Lab Results  Component Value Date   WBC 7.9 02/05/2021   HGB 15.4 02/05/2021   HCT 46.2 02/05/2021   MCV 89.4 02/05/2021   PLT 215 02/05/2021      Chemistry      Component Value  Date/Time   NA 140 02/05/2021 0940   K 3.9 02/05/2021 0940   CL 103 02/05/2021 0940   CO2 27 02/05/2021 0940   BUN 10 02/05/2021 0940   CREATININE 1.13 02/05/2021 0940      Component Value Date/Time   CALCIUM 10.6 (H) 02/05/2021 0940   ALKPHOS 94 02/05/2021 0940   AST 16 02/05/2021 0940   ALT 17 02/05/2021 0940   BILITOT 0.6 02/05/2021 0940       RADIOGRAPHIC STUDIES: NM PET Image Restage (PS) Skull Base to Thigh (F-18 FDG)  Result Date: 02/20/2021 CLINICAL DATA:  Follow-up treatment strategy for non-small-cell lung cancer EXAM: NUCLEAR MEDICINE PET SKULL BASE TO THIGH TECHNIQUE: 9.7 mCi F-18 FDG was injected intravenously. Full-ring PET imaging was performed from the skull base to thigh after the radiotracer. CT data was obtained and used for attenuation correction and anatomic localization. Fasting blood glucose: 115 mg/dl COMPARISON:  CT chest dated February 05, 2021 FINDINGS: Mediastinal blood pool activity: SUV max 2.1 Liver activity: SUV max 2.8 NECK: Unchanged right thyroid nodule Incidental CT findings: none CHEST: Status post wedge resection of the left upper lobe. Focal area of nodular soft tissue adjacent to the suture demonstrates hypermetabolic activity with an SUV max of 5.5 and is unchanged in size compared to recent prior CT. Nodule measuring 7 mm of the anterior aspect of the inferior left upper lobe demonstrates hypermetabolic activity with an SUV max of 5.2, unchanged in size compared to recent CT. Hypermetabolic pleural-based nodule within the paravertebral left lower lobe measuring 1.6 x 1.3 cm with an SUV max of 9.7, unchanged in size compared to recent CT. Additional smaller hypermetabolic subpleural nodules are seen in the left hemithorax. New trace left pleural effusion. Incidental CT findings: Three-vessel coronary artery calcifications. Atherosclerotic disease of the thoracic aorta. Small hiatal hernia. Severe upper lobe predominant centrilobular emphysema.  ABDOMEN/PELVIS: No abnormal hypermetabolic activity within the liver, pancreas, adrenal glands, or spleen. No hypermetabolic lymph nodes in the abdomen or pelvis. Incidental CT findings: Cholelithiasis. Atrophic right kidney. Bladder wall thickening, Foley catheter in place. Median lobe hypertrophy of the prostate. Aorto bi femoral bypass graft. SKELETON: No focal hypermetabolic activity to suggest skeletal metastasis. Incidental CT findings: none IMPRESSION: Focal nodular soft tissue adjacent to left upper lobe postsurgical changes demonstrates hypermetabolic activity. Hypermetabolic peripheral left upper lobe nodule. Multiple hypermetabolic left pleural nodules, largest is located within the paravertebral aspect of the posterior left lower lobe.  New trace left pleural effusion. No evidence FDG avid of metastatic disease in the abdomen or pelvis. Aortic Atherosclerosis (ICD10-I70.0) and Emphysema (ICD10-J43.9). Electronically Signed   By: Yetta Glassman M.D.   On: 02/20/2021 14:38     ASSESSMENT AND PLAN: This is a very pleasant 75 years old white male recently diagnosed with stage IIIa (T2 a, N2, M0) non-small cell lung cancer, adenocarcinoma presented with left upper lobe lung nodule and after resection the patient was found to have metastatic disease to the mediastinal lymphadenopathy.  This was diagnosed in March 2021.   He is currently undergoing adjuvant systemic chemotherapy with cisplatin, pemetrexed and pembrolizumab status post 2 cycles. His treatment was discontinued secondary to significant intolerability to this treatment with frequent admission to the hospital with enteritis, acute renal failure as well as pancytopenia. The patient is currently on observation and he is feeling fine today with no concerning complaints except for mild fatigue. He had repeat CT scan of the chest performed recently.  I personally and independently reviewed the scan images and discussed the results with the  patient and his wife today. Unfortunately his scan showed increase in the size of a peripheral nodule within the left upper lobe suspicious for new site of disease. I ordered a PET scan and the patient is here for discussion of his scan results.  His PET scan showed focal nodular soft tissue adjacent to the left upper lobe postsurgical changes with hypermetabolic activity.  There was also hypermetabolic peripheral left upper lobe nodule and multiple hypermetabolic left pleural nodules the largest located within the paravertebral aspect of the posterior left lower lobe with new trace left pleural effusion.  But no evidence of metastatic disease in the abdomen or pelvis. I personally and independently reviewed the PET scan images and discussed the result and showed the images to the patient and his wife. I recommended for him to see Dr. Lamonte Sakai for consideration of bronchoscopy and biopsy of one of the new hypermetabolic nodules.  The other option would be consideration of CT-guided core biopsy of the hypermetabolic paravertebral left pleural nodule. I will see the patient back for follow-up visit in around 3 weeks for evaluation and discussion of his treatment options based on the biopsy results. For smoking cessation I strongly encouraged the patient to quit smoking. He was advised to call immediately if he has any other concerning symptoms in the interval. The patient voices understanding of current disease status and treatment options and is in agreement with the current care plan. All questions were answered. The patient knows to call the clinic with any problems, questions or concerns. We can certainly see the patient much sooner if necessary.  Disclaimer: This note was dictated with voice recognition software. Similar sounding words can inadvertently be transcribed and may not be corrected upon review.

## 2021-03-18 NOTE — Patient Instructions (Signed)
Steps to Quit Smoking Smoking tobacco is the leading cause of preventable death. It can affect almost every organ in the body. Smoking puts you and people around you at risk for many serious, long-lasting (chronic) diseases. Quitting smoking can be hard, but it is one of the best things that you can do for your health. It is never too late to quit. How do I get ready to quit? When you decide to quit smoking, make a plan to help you succeed. Before you quit: Pick a date to quit. Set a date within the next 2 weeks to give you time to prepare. Write down the reasons why you are quitting. Keep this list in places where you will see it often. Tell your family, friends, and co-workers that you are quitting. Their support is important. Talk with your doctor about the choices that may help you quit. Find out if your health insurance will pay for these treatments. Know the people, places, things, and activities that make you want to smoke (triggers). Avoid them. What first steps can I take to quit smoking? Throw away all cigarettes at home, at work, and in your car. Throw away the things that you use when you smoke, such as ashtrays and lighters. Clean your car. Make sure to empty the ashtray. Clean your home, including curtains and carpets. What can I do to help me quit smoking? Talk with your doctor about taking medicines and seeing a counselor at the same time. You are more likely to succeed when you do both. If you are pregnant or breastfeeding, talk with your doctor about counseling or other ways to quit smoking. Do not take medicine to help you quit smoking unless your doctor tells you to do so. To quit smoking: Quit right away Quit smoking totally, instead of slowly cutting back on how much you smoke over a period of time. Go to counseling. You are more likely to quit if you go to counseling sessions regularly. Take medicine You may take medicines to help you quit. Some medicines need a  prescription, and some you can buy over-the-counter. Some medicines may contain a drug called nicotine to replace the nicotine in cigarettes. Medicines may: Help you to stop having the desire to smoke (cravings). Help to stop the problems that come when you stop smoking (withdrawal symptoms). Your doctor may ask you to use: Nicotine patches, gum, or lozenges. Nicotine inhalers or sprays. Non-nicotine medicine that is taken by mouth. Find resources Find resources and other ways to help you quit smoking and remain smoke-free after you quit. These resources are most helpful when you use them often. They include: Online chats with a counselor. Phone quitlines. Printed self-help materials. Support groups or group counseling. Text messaging programs. Mobile phone apps. Use apps on your mobile phone or tablet that can help you stick to your quit plan. There are many free apps for mobile phones and tablets as well as websites. Examples include Quit Guide from the CDC and smokefree.gov  What things can I do to make it easier to quit?  Talk to your family and friends. Ask them to support and encourage you. Call a phone quitline (1-800-QUIT-NOW), reach out to support groups, or work with a counselor. Ask people who smoke to not smoke around you. Avoid places that make you want to smoke, such as: Bars. Parties. Smoke-break areas at work. Spend time with people who do not smoke. Lower the stress in your life. Stress can make you want to   smoke. Try these things to help your stress: Getting regular exercise. Doing deep-breathing exercises. Doing yoga. Meditating. Doing a body scan. To do this, close your eyes, focus on one area of your body at a time from head to toe. Notice which parts of your body are tense. Try to relax the muscles in those areas. How will I feel when I quit smoking? Day 1 to 3 weeks Within the first 24 hours, you may start to have some problems that come from quitting tobacco.  These problems are very bad 2-3 days after you quit, but they do not often last for more than 2-3 weeks. You may get these symptoms: Mood swings. Feeling restless, nervous, angry, or annoyed. Trouble concentrating. Dizziness. Strong desire for high-sugar foods and nicotine. Weight gain. Trouble pooping (constipation). Feeling like you may vomit (nausea). Coughing or a sore throat. Changes in how the medicines that you take for other issues work in your body. Depression. Trouble sleeping (insomnia). Week 3 and afterward After the first 2-3 weeks of quitting, you may start to notice more positive results, such as: Better sense of smell and taste. Less coughing and sore throat. Slower heart rate. Lower blood pressure. Clearer skin. Better breathing. Fewer sick days. Quitting smoking can be hard. Do not give up if you fail the first time. Some people need to try a few times before they succeed. Do your best to stick to your quit plan, and talk with your doctor if you have any questions or concerns. Summary Smoking tobacco is the leading cause of preventable death. Quitting smoking can be hard, but it is one of the best things that you can do for your health. When you decide to quit smoking, make a plan to help you succeed. Quit smoking right away, not slowly over a period of time. When you start quitting, seek help from your doctor, family, or friends. This information is not intended to replace advice given to you by your health care provider. Make sure you discuss any questions you have with your health care provider. Document Revised: 03/04/2019 Document Reviewed: 08/28/2018 Elsevier Patient Education  2022 [employer]  

## 2021-03-19 DIAGNOSIS — I2510 Atherosclerotic heart disease of native coronary artery without angina pectoris: Secondary | ICD-10-CM | POA: Diagnosis not present

## 2021-03-19 DIAGNOSIS — I10 Essential (primary) hypertension: Secondary | ICD-10-CM | POA: Diagnosis not present

## 2021-03-19 DIAGNOSIS — E1142 Type 2 diabetes mellitus with diabetic polyneuropathy: Secondary | ICD-10-CM | POA: Diagnosis not present

## 2021-03-20 ENCOUNTER — Other Ambulatory Visit: Payer: Self-pay | Admitting: *Deleted

## 2021-03-20 DIAGNOSIS — I10 Essential (primary) hypertension: Secondary | ICD-10-CM

## 2021-03-20 NOTE — Patient Instructions (Addendum)
Goals Addressed             This Visit's Progress    [facility]) Patient will verbalize talking to [facility] Coordinator regarding financial strain within the next 30 days       Timeframe:  Long-Range Goal Priority:  Medium Start Date:  03/20/21                           Expected End Date: 05/22/21 Follow-up Date: 05/22/21  Notes: 03/20/21: Patient reports experiencing financial strain. His car has broken down and he cannot afford to fix it. He explains his daughter does provide transportation mostly and he does have a few other transportation options. Patient states that when the extra SNAP stops due to covid emergency being lifted he will not be able to afford food. Patient shares that his rent continues to go up and it is difficult to pay for all of his bills. Patient states he would like to discuss any financial relief options with community care coordinator and he would like to discuss getting on the Meals on Wheels waiting list as it is a hardship to get  to the grocery store and prepare meals. Nurse will refer patient to [facility] for assistance.                         THN CM: Matintain My Quality of Life       Timeframe:  Long-Range Goal Priority:  Medium Start Date:  05/04/20                           Expected End Date:  10/20/20                    Follow Up Date 05/22/21    - complete a living will - discuss my treatment options with the doctor or nurse - do one enjoyable thing every day - name a health care proxy (decision maker)    Why is this important?   Having a long-term illness can be scary.  It can also be stressful for you and your caregiver.  These steps may help.    Notes:   06/13/20: -- Start pulmonary rehabilitation as soon as you are able- this is a great program that will help you manage your ability to tolerate activity and any shortness of breath you might have  Updated 07/12/20 Patient requested that this nurse send him advance  directive documents.     THN CM: Track and Manage My COPD triggers and symptoms       Timeframe:  Long-Range Goal Priority:  High Start Date: 05/04/20                            Expected End Date: 06/21/21                    Follow Up Date 06/21/21   - begin a symptom diary - develop a rescue plan - eliminate symptom triggers at home - follow rescue plan if symptoms flare-up - keep follow-up appointments    Why is this important?   Tracking your symptoms and other information about your health helps your doctor plan your care.  Write down the symptoms, the time of day, what you were doing and what medicine you are taking.  You will  soon learn how to manage your symptoms.     Notes: Updated 03/20/21: Patient reports that he has recurrent lung cancer. His oncologist has referred him to have a bronchoscopy with biopsy or a CT guided biopsy. The plan is to review the biopsy results with Dr. Julien Nordmann and discuss treatment and options. Patient explains that he is going through the motions of what needs to take place. Nurse provided emotional support and he is well supported by his wife and family. Patient explains that he continues to feel physically well and that his COPD is currently at baseline and under control. Patient states that he is not ready to quit smoking at this time.  Updated 12/13/20: Patient reports having good control with his COPD. He does explain that he became short of breath while working on his car during the hot weather. Nurse discussed limiting time outside in extreme weather when possible.   Updated: 07/12/20 Patient reports that his COPD is well controlled presently. Patient records all of his taken medications and tracks how he feels daily. He monitors his symptoms closely and contacts his providers as indicated per his COPD action plan.

## 2021-03-20 NOTE — Patient Outreach (Signed)
[facility]) Care Management  [place]  03/20/2021   Chad Gomez [birth year] [id number]  Subjective: Successful telephone outreach call to patient. HIPAA identifiers obtained. Patient reports that he has recurrent lung cancer. His oncologist has referred him to have a bronchoscopy with biopsy or a CT guided biopsy. The plan is to review the biopsy results with Dr. Julien Nordmann and discuss treatment and options. Patient explains that he is going through the motions of what needs to take place. Nurse provided emotional support and he is well supported by his wife and family. Patient explains that he continues to feel physically well and that his COPD is currently at baseline and under control. Patient states that he is not ready to quit smoking at this time.  Nurse discussed with patient his health goals and his health and wellness needs which were documented in the Epic system. Patient did not have any further questions or concerns today and did confirm that he has this nurse's contact number to call her if needed.   Encounter Medications:  Outpatient Encounter Medications as of 03/20/2021  Medication Sig Note   acetaminophen (TYLENOL) 500 MG tablet Take 2 tablets (1,000 mg total) by mouth every 6 (six) hours as needed for mild pain.    albuterol (VENTOLIN HFA) 108 (90 Base) MCG/ACT inhaler Inhale 2 puffs into the lungs every 6 (six) hours as needed for wheezing or shortness of breath.    amLODipine (NORVASC) 10 MG tablet Take 10 mg by mouth daily.    aspirin EC 81 MG tablet Take 81 mg by mouth daily.    atorvastatin (LIPITOR) 40 MG tablet Take 40 mg by mouth daily.    Cholecalciferol (VITAMIN D-3) 1000 units CAPS Take 1,000 Units by mouth daily.     ezetimibe (ZETIA) 10 MG tablet Take 5 mg by mouth daily. 02/21/2020: Wife states taking it as needed.    finasteride (PROSCAR) 5 MG tablet Take 5 mg by mouth daily. 07/12/2020: Patient states that he takes this instead of  Flomax   fluticasone (FLONASE) 50 MCG/ACT nasal spray Place 2 sprays into both nostrils daily.     gabapentin (NEURONTIN) 100 MG capsule Take 200 mg by mouth 3 (three) times daily. Taking 500mg  daily    folic acid (FOLVITE) 1 MG tablet Take 1 mg by mouth daily. (Patient not taking: No sig reported) 07/12/2020: Takes One A Day vitamin that has folic acid   indapamide (LOZOL) 1.25 MG tablet Take 1.25 mg by mouth daily.    metFORMIN (GLUCOPHAGE-XR) 500 MG 24 hr tablet Take 1,000 mg by mouth See admin instructions. Take 1000mg  every morning and take 1000 mg at supper    olmesartan (BENICAR) 20 MG tablet Take 20 mg by mouth daily.    potassium citrate (UROCIT-K) 10 MEQ (1080 MG) SR tablet Take 10 mEq by mouth in the morning and at bedtime.     Tiotropium Bromide-Olodaterol (STIOLTO RESPIMAT) 2.5-2.5 MCG/ACT AERS Inhale 2 puffs into the lungs daily.    traMADol (ULTRAM) 50 MG tablet Take 1 tablet (50 mg total) by mouth every 6 (six) hours as needed (mild pain). 07/12/2020: Have not needed this   No facility-administered encounter medications on file as of 03/20/2021.    Functional Status:  No flowsheet data found.  Fall/Depression Screening: Fall Risk  03/20/2021 12/13/2020 07/12/2020  Falls in the past year? 1 1 1   Number falls in past yr: 0 0 0  Injury with Fall? 0 0 0  Comment - - -  Risk for fall due to : History of fall(s);Impaired mobility;Impaired balance/gait History of fall(s) History of fall(s)  Follow up Falls prevention discussed;Education provided;Falls evaluation completed Falls evaluation completed;Education provided;Falls prevention discussed Falls prevention discussed;Education provided;Falls evaluation completed   PHQ 2/9 Scores 07/12/2020 06/13/2020 05/04/2020 02/21/2020 11/16/2019  PHQ - 2 Score 0 1 0 0 -  Exception Documentation - - - - Other- indicate reason in comment box  Not completed - - - - Spoke with wife per patient and (patient's wife / designated party release) request.     Assessment:   Care Plan There are no care plans that you recently modified to display for this patient.    Goals Addressed             This Visit's Progress    [facility]) Patient will verbalize talking to [facility] Coordinator regarding financial strain within the next 30 days       Timeframe:  Long-Range Goal Priority:  Medium Start Date:  03/20/21                           Expected End Date: 05/22/21 Follow-up Date: 05/22/21  Notes: 03/20/21: Patient reports experiencing financial strain. His car has broken down and he cannot afford to fix it. He explains his daughter does provide transportation mostly and he does have a few other transportation options. Patient states that when the extra SNAP stops due to covid emergency being lifted he will not be able to afford food. Patient shares that his rent continues to go up and it is difficult to pay for all of his bills. Patient states he would like to discuss any financial relief options with community care coordinator and he would like to discuss getting on the Meals on Wheels waiting list as it is a hardship to get  to the grocery store and prepare meals. Nurse will refer patient to [facility] for assistance.                         THN CM: Matintain My Quality of Life       Timeframe:  Long-Range Goal Priority:  Medium Start Date:  05/04/20                           Expected End Date:  10/20/20                    Follow Up Date 05/22/21    - complete a living will - discuss my treatment options with the doctor or nurse - do one enjoyable thing every day - name a health care proxy (decision maker)    Why is this important?   Having a long-term illness can be scary.  It can also be stressful for you and your caregiver.  These steps may help.    Notes:   06/13/20: -- Start pulmonary rehabilitation as soon as you are able- this is a great program that will help you manage your ability to tolerate activity  and any shortness of breath you might have  Updated 07/12/20 Patient requested that this nurse send him advance directive documents.     THN CM: Track and Manage My COPD triggers and symptoms       Timeframe:  Long-Range Goal Priority:  High Start Date: 05/04/20  Expected End Date: 06/21/21                    Follow Up Date 06/21/21   - begin a symptom diary - develop a rescue plan - eliminate symptom triggers at home - follow rescue plan if symptoms flare-up - keep follow-up appointments    Why is this important?   Tracking your symptoms and other information about your health helps your doctor plan your care.  Write down the symptoms, the time of day, what you were doing and what medicine you are taking.  You will soon learn how to manage your symptoms.     Notes: Updated 03/20/21: Patient reports that he has recurrent lung cancer. His oncologist has referred him to have a bronchoscopy with biopsy or a CT guided biopsy. The plan is to review the biopsy results with Dr. Julien Nordmann and discuss treatment and options. Patient explains that he is going through the motions of what needs to take place. Nurse provided emotional support and he is well supported by his wife and family. Patient explains that he continues to feel physically well and that his COPD is currently at baseline and under control. Patient states that he is not ready to quit smoking at this time.  Updated 12/13/20: Patient reports having good control with his COPD. He does explain that he became short of breath while working on his car during the hot weather. Nurse discussed limiting time outside in extreme weather when possible.   Updated: 07/12/20 Patient reports that his COPD is well controlled presently. Patient records all of his taken medications and tracks how he feels daily. He monitors his symptoms closely and contacts his providers as indicated per his COPD action plan.         Plan: RN  Health Coach will send PCP a quarterly update, will send patient hernia education, and will call patient within the month of November. Follow-up: Patient agrees to Care Plan and Follow-up.  [name] RN, BSN [place] [phone] [email or web address]

## 2021-03-22 ENCOUNTER — Telehealth: Payer: Self-pay | Admitting: *Deleted

## 2021-03-22 NOTE — Telephone Encounter (Signed)
   Telephone encounter was:  Unsuccessful.  03/22/2021 Name: Chad Gomez MRN: [id number] DOB: [birth year]  Unsuccessful outbound call made today to assist with:  Food Insecurity  Outreach Attempt:  1st Attempt  A HIPAA compliant voice message was left requesting a return call.  Instructed patient to call back at   Instructed patient to call back at [phone]  at their earliest convenience.   [place], Care Management  [phone] [address] , [place] [address] Email : [email or web address]

## 2021-04-02 ENCOUNTER — Telehealth: Payer: Self-pay | Admitting: Physician Assistant

## 2021-04-02 ENCOUNTER — Encounter: Payer: Self-pay | Admitting: *Deleted

## 2021-04-02 ENCOUNTER — Telehealth: Payer: Self-pay

## 2021-04-02 NOTE — Progress Notes (Deleted)
[place] OFFICE PROGRESS NOTE  [name], MD [place] [address]  DIAGNOSIS: ***  PRIOR THERAPY:  CURRENT THERAPY:  INTERVAL HISTORY: Chad Gomez 75 y.o. male returns for *** regular *** visit for followup of ***   MEDICAL HISTORY: Past Medical History:  Diagnosis Date   BPH (benign prostatic hypertrophy)    weak stream   Chronic kidney disease    Complication of anesthesia    urinary retention   COPD (chronic obstructive pulmonary disease) (HCC)    Diabetes mellitus without complication (HCC)    Type II   History of bladder stone    History of colon polyps    08/2013   pre-canerous   History of kidney stones    History of melanoma excision    LEFT EAR   Hyperlipidemia    Hypertension    Iron deficiency    Lung cancer, lingula ([place]) 11/01/2019   Open wound    left lower jaw; followed by ENT   Peripheral vascular disease ([place])    Right ureteral stone    Shingles 05-15-2015    ALLERGIES:  is allergic to gabapentin, morphine and related, tape, allevyn adhesive [wound dressings], amoxicillin, and naproxen.  MEDICATIONS:  Current Outpatient Medications  Medication Sig Dispense Refill   acetaminophen (TYLENOL) 500 MG tablet Take 2 tablets (1,000 mg total) by mouth every 6 (six) hours as needed for mild pain. 30 tablet 0   albuterol (VENTOLIN HFA) 108 (90 Base) MCG/ACT inhaler Inhale 2 puffs into the lungs every 6 (six) hours as needed for wheezing or shortness of breath. 8 g 3   amLODipine (NORVASC) 10 MG tablet Take 10 mg by mouth daily.     aspirin EC 81 MG tablet Take 81 mg by mouth daily.     atorvastatin (LIPITOR) 40 MG tablet Take 40 mg by mouth daily.     Cholecalciferol (VITAMIN D-3) 1000 units CAPS Take 1,000 Units by mouth daily.      ezetimibe (ZETIA) 10 MG tablet Take 5 mg by mouth daily.     finasteride (PROSCAR) 5 MG tablet Take 5 mg by mouth daily.     fluticasone (FLONASE) 50 MCG/ACT nasal  spray Place 2 sprays into both nostrils daily.      folic acid (FOLVITE) 1 MG tablet Take 1 mg by mouth daily. (Patient not taking: No sig reported)     gabapentin (NEURONTIN) 100 MG capsule Take 200 mg by mouth 3 (three) times daily. Taking 500mg  daily     indapamide (LOZOL) 1.25 MG tablet Take 1.25 mg by mouth daily.     metFORMIN (GLUCOPHAGE-XR) 500 MG 24 hr tablet Take 1,000 mg by mouth See admin instructions. Take 1000mg  every morning and take 1000 mg at supper     olmesartan (BENICAR) 20 MG tablet Take 20 mg by mouth daily.     potassium citrate (UROCIT-K) 10 MEQ (1080 MG) SR tablet Take 10 mEq by mouth in the morning and at bedtime.      Tiotropium Bromide-Olodaterol (STIOLTO RESPIMAT) 2.5-2.5 MCG/ACT AERS Inhale 2 puffs into the lungs daily. 12 g 3   traMADol (ULTRAM) 50 MG tablet Take 1 tablet (50 mg total) by mouth every 6 (six) hours as needed (mild pain). 20 tablet 0   No current facility-administered medications for this visit.    SURGICAL HISTORY:  Past Surgical History:  Procedure Laterality Date   ABDOMINAL AORTAGRAM  11/01/2014   Procedure: Abdominal Aortagram;  Surgeon:  Serafina Mitchell, MD;  Location: [facility];  Service: Cardiovascular;;   AORTA - BILATERAL FEMORAL ARTERY BYPASS GRAFT N/A 12/28/2014   Procedure: AORTOBIFEMORAL BYPASS GRAFT;  Surgeon: Serafina Mitchell, MD;  Location: [facility];  Service: Vascular;  Laterality: N/A;   BRONCHIAL BRUSHINGS  09/06/2019   Procedure: BRONCHIAL BRUSHINGS;  Surgeon: Collene Gobble, MD;  Location: [facility];  Service: Pulmonary;;  left upper lobe nodule   BRONCHIAL WASHINGS  09/06/2019   Procedure: BRONCHIAL WASHINGS;  Surgeon: Collene Gobble, MD;  Location: [facility]COPY;  Service: Pulmonary;;   COLONOSCOPY W/ POLYPECTOMY  09-13-2013   CYSTOLITHALOPAXY OF BLADDER STONE/ TRANSRECTAL ULTRASOUND PROSTATE BX  08-19-2005   CYSTOSCOPY W/ RETROGRADES Right 12/02/2012   Procedure: CYSTOSCOPY WITH RETROGRADE PYELOGRAM;  Surgeon: Malka So, MD;  Location: [facility];  Service: Urology;  Laterality: Right;   CYSTOSCOPY W/ RETROGRADES Bilateral 10/13/2013   Procedure: CYSTOSCOPY WITH RETROGRADE PYELOGRAM;  Surgeon: Irine Seal, MD;  Location: [facility];  Service: Urology;  Laterality: Bilateral;   CYSTOSCOPY W/ URETERAL STENT PLACEMENT Left 04/11/2014   Procedure: CYSTOSCOPY WITH STENT REPLACEMENT;  Surgeon: Malka So, MD;  Location: [facility];  Service: Urology;  Laterality: Left;   CYSTOSCOPY WITH LITHOLAPAXY Right 12/02/2012   Procedure: CYSTOSCOPY WITH stone extraction;  Surgeon: Malka So, MD;  Location: [facility];  Service: Urology;  Laterality: Right;   CYSTOSCOPY WITH LITHOLAPAXY Right 10/13/2013   Procedure: CYSTOSCOPY WITH LITHOLAPAXY, removal of urethral stone with basket;  Surgeon: Irine Seal, MD;  Location: [facility];  Service: Urology;  Laterality: Right;   CYSTOSCOPY WITH RETROGRADE PYELOGRAM, URETEROSCOPY AND STENT PLACEMENT Left 03/21/2014   Procedure: CYSTOSCOPY WITH RETROGRADE PYELOGRAM, AND LEFT STENT PLACEMENT;  Surgeon: Alexis Frock, MD;  Location: [facility];  Service: Urology;  Laterality: Left;   CYSTOSCOPY WITH RETROGRADE PYELOGRAM, URETEROSCOPY AND STENT PLACEMENT  09/09/2016   Procedure: CYSTOSCOPY WITH RIGHT  RETROGRADE PYELOGRAM, URETEROSCOPY WITH LASER BASKET EXTRACTION OF STONES  AND STENT PLACEMENT;  Surgeon: Irine Seal, MD;  Location: [facility];  Service: Urology;;   CYSTOSCOPY WITH URETEROSCOPY Left 04/11/2014   Procedure: CYSTOSCOPY WITH URETEROSCOPY/ STONE EXTRACTION;  Surgeon: Malka So, MD;  Location: [facility];  Service: Urology;  Laterality: Left;   FINE NEEDLE ASPIRATION  09/06/2019   Procedure: FINE NEEDLE ASPIRATION;  Surgeon: Collene Gobble, MD;  Location: [facility]COPY;  Service: Pulmonary;;  left upper lobe - lung   HOLMIUM LASER APPLICATION Right 7/34/1937    Procedure: HOLMIUM LASER APPLICATION;  Surgeon: Malka So, MD;  Location: [facility];  Service: Urology;  Laterality: Right;   HOLMIUM LASER APPLICATION Left 90/24/0973   Procedure: HOLMIUM LASER APPLICATION;  Surgeon: Malka So, MD;  Location[facility];  Service: Urology;  Laterality: Left;   HOLMIUM LASER APPLICATION Right 5/32/9924   Procedure: HOLMIUM LASER APPLICATION;  Surgeon: Irine Seal, MD;  Location: [facility];  Service: Urology;  Laterality: Right;   INGUINAL HERNIA REPAIR Right 1954   INTERCOSTAL NERVE BLOCK Left 10/28/2019   Procedure: Intercostal Nerve Block;  Surgeon: [name], MD;  Location: [facility];  Service: Thoracic;  Laterality: Left;   LITHOTRIPSY  2007 & 02/2013   LUNG BIOPSY  09/06/2019   Procedure: LUNG BIOPSY;  Surgeon: Collene Gobble, MD;  Location[facility];  Service: Pulmonary;;  left upper lob lung  MANDIBLE FRACTURE SURGERY  1995   MICROLARYNGOSCOPY Right 02/18/2016   Procedure: MICRO DIRECT LARYNGOSCOPY EXCISIONAL BIOPSY OF RIGHT VOCAL CORD MASS;  Surgeon: Leta [place], MD;  Location: [facility];  Service: ENT;  Laterality: Right;   [place]   left ear   MULTIPLE TOOTH EXTRACTIONS  1994   NEPHROLITHOTOMY Left 01/27/2013   Procedure: NEPHROLITHOTOMY PERCUTANEOUS FIRST LOOK;  Surgeon: Malka So, MD;  Location: [facility];  Service: Urology;  Laterality: Left;   NODE DISSECTION  10/28/2019   Procedure: Node Dissection;  Surgeon: [name], MD;  Location: [facility];  Service: Thoracic;;   PERIPHERAL VASCULAR CATHETERIZATION N/A 11/01/2014   Procedure: Lower Extremity Angiography;  Surgeon: Serafina Mitchell, MD;  Location: [facility];  Service: Cardiovascular;  Laterality: N/A;   URETEROSCOPY Right 12/02/2012   Procedure: RIGHT URETEROSCOPY STONE EXTRACTION WITH  STENT PLACEMENT;  Surgeon: Malka So, MD;  Location: [facility];  Service: Urology;  Laterality:  Right;   VIDEO BRONCHOSCOPY  10/28/2019   VIDEO BRONCHOSCOPY WITH ENDOBRONCHIAL NAVIGATION WITH LUNG MARKINGS (N/A   VIDEO BRONCHOSCOPY WITH ENDOBRONCHIAL NAVIGATION N/A 09/06/2019   Procedure: VIDEO BRONCHOSCOPY WITH ENDOBRONCHIAL NAVIGATION;  Surgeon: Collene Gobble, MD;  Location: [facility];  Service: Pulmonary;  Laterality: N/A;   VIDEO BRONCHOSCOPY WITH ENDOBRONCHIAL NAVIGATION N/A 10/28/2019   Procedure: VIDEO BRONCHOSCOPY WITH ENDOBRONCHIAL NAVIGATION WITH LUNG MARKINGS;  Surgeon: [name], MD;  Location: [facility];  Service: Thoracic;  Laterality: N/A;   WEDGE RESECTION  10/28/2019   LEFT UPPER LOBE    REVIEW OF SYSTEMS:   Review of Systems  Constitutional: Negative for appetite change, chills, fatigue, fever and unexpected weight change.  HENT:   Negative for mouth sores, nosebleeds, sore throat and trouble swallowing.   Eyes: Negative for eye problems and icterus.  Respiratory: Negative for cough, hemoptysis, shortness of breath and wheezing.   Cardiovascular: Negative for chest pain and leg swelling.  Gastrointestinal: Negative for abdominal pain, constipation, diarrhea, nausea and vomiting.  Genitourinary: Negative for bladder incontinence, difficulty urinating, dysuria, frequency and hematuria.   Musculoskeletal: Negative for back pain, gait problem, neck pain and neck stiffness.  Skin: Negative for itching and rash.  Neurological: Negative for dizziness, extremity weakness, gait problem, headaches, light-headedness and seizures.  Hematological: Negative for adenopathy. Does not bruise/bleed easily.  Psychiatric/Behavioral: Negative for confusion, depression and sleep disturbance. The patient is not nervous/anxious.     PHYSICAL EXAMINATION:  There were no vitals taken for this visit.  ECOG PERFORMANCE STATUS: {CHL ONC ECOG [id number]  Physical Exam  Constitutional: Oriented to person, place, and time and well-developed, well-nourished, and in no distress. No  distress.  HENT:  Head: Normocephalic and atraumatic.  Mouth/Throat: Oropharynx is clear and moist. No oropharyngeal exudate.  Eyes: Conjunctivae are normal. Right eye exhibits no discharge. Left eye exhibits no discharge. No scleral icterus.  Neck: Normal range of motion. Neck supple.  Cardiovascular: Normal rate, regular rhythm, normal heart sounds and intact distal pulses.   Pulmonary/Chest: Effort normal and breath sounds normal. No respiratory distress. No wheezes. No rales.  Abdominal: Soft. Bowel sounds are normal. Exhibits no distension and no mass. There is no tenderness.  Musculoskeletal: Normal range of motion. Exhibits no edema.  Lymphadenopathy:    No cervical adenopathy.  Neurological: Alert and oriented to person, place, and time. Exhibits normal muscle tone. Gait normal. Coordination normal.  Skin: Skin is warm and dry. No rash noted. Not diaphoretic.  No erythema. No pallor.  Psychiatric: Mood, memory and judgment normal.  Vitals reviewed.  LABORATORY DATA: Lab Results  Component Value Date   WBC 7.9 02/05/2021   HGB 15.4 02/05/2021   HCT 46.2 02/05/2021   MCV 89.4 02/05/2021   PLT 215 02/05/2021      Chemistry      Component Value Date/Time   NA 140 02/05/2021 0940   K 3.9 02/05/2021 0940   CL 103 02/05/2021 0940   CO2 27 02/05/2021 0940   BUN 10 02/05/2021 0940   CREATININE 1.13 02/05/2021 0940      Component Value Date/Time   CALCIUM 10.6 (H) 02/05/2021 0940   ALKPHOS 94 02/05/2021 0940   AST 16 02/05/2021 0940   ALT 17 02/05/2021 0940   BILITOT 0.6 02/05/2021 0940       RADIOGRAPHIC STUDIES:  No results found.   ASSESSMENT/PLAN:  No problem-specific Assessment & Plan notes found for this encounter.   No orders of the defined types were placed in this encounter.    I spent {CHL ONC TIME VISIT - YOFVW:[id number]} counseling the patient face to face. The total time spent in the appointment was {CHL ONC TIME VISIT -  DPTEL:[id number]}.  Aniqua Briere L Burleigh Brockmann, PA-C 04/02/21

## 2021-04-02 NOTE — Progress Notes (Signed)
Cassie PA-C updated me that Mr. Mester referral to pulmonary has not been set up yet. Per Dr. Julien Nordmann he asked that I reach out to Dr. Lamonte Sakai.  I set Dr. Lamonte Sakai an in-basket message asking who I can contact in his office for an appt. Wait for response.

## 2021-04-02 NOTE — Telephone Encounter (Signed)
Scheduled per sch msg. Called, not able to leave msg. Mailed printout

## 2021-04-02 NOTE — Telephone Encounter (Signed)
Spoke with pt and offered consult on 04/03/21 or 04/04/21 which the pt refused and stated he could not make. Pt then offered first available consult which was 04/23/21. Pt accepted and was scheduled on 04/23/21.

## 2021-04-03 ENCOUNTER — Telehealth: Payer: Self-pay | Admitting: *Deleted

## 2021-04-03 NOTE — Telephone Encounter (Signed)
   Telephone encounter was:  Successful.  04/03/2021 Name: Chad Gomez MRN: [id number] DOB: July 09, 1945  Chad Gomez is a 75 y.o. year old male who is a primary care patient of [name], MD . The community resource team was consulted for assistance with  left call back message with wife   Care guide performed the following interventions: Patient provided with information about care guide support team and interviewed to confirm resource needs.  Follow Up Plan:  Client will call back [place], Care Management  [phone] [address] , [place] [address] Email : [email or web address]

## 2021-04-03 NOTE — Telephone Encounter (Signed)
   Telephone encounter was:  Successful.  04/03/2021 Name: Chad Gomez MRN: [id number] DOB: [birth year]  Chad Gomez is a 75 y.o. year old male who is a primary care patient of [name], MD . The community resource team was consulted for assistance with Transportation Needs  and [place] guide performed the following interventions: Patient provided with information about care guide support team and interviewed to confirm resource needs.  Follow Up Plan:  No further follow up planned at this time. The patient has been provided with needed resources.  [place], Care Management  [phone] [address] , [place] [address] Email : [email or web address]

## 2021-04-08 ENCOUNTER — Inpatient Hospital Stay: Payer: Medicare Other

## 2021-04-08 ENCOUNTER — Inpatient Hospital Stay: Payer: Medicare Other | Admitting: Physician Assistant

## 2021-04-10 ENCOUNTER — Other Ambulatory Visit: Payer: Self-pay | Admitting: Urology

## 2021-04-12 ENCOUNTER — Encounter: Payer: Self-pay | Admitting: *Deleted

## 2021-04-12 NOTE — Progress Notes (Signed)
I followed up on [name] appt to see pulmonary.  They have reached out to him and patient declined an early appt with Dr. Lamonte Sakai but possible on 11/2.  I reached out to RN with Dr. Sudie Bailey office to get an update. Wait for response.

## 2021-04-22 ENCOUNTER — Inpatient Hospital Stay: Payer: Medicare Other

## 2021-04-22 ENCOUNTER — Ambulatory Visit: Payer: Medicare Other | Admitting: Physician Assistant

## 2021-04-22 ENCOUNTER — Inpatient Hospital Stay: Payer: Medicare Other | Admitting: Internal Medicine

## 2021-04-23 ENCOUNTER — Telehealth: Payer: Self-pay | Admitting: Pulmonary Disease

## 2021-04-23 DIAGNOSIS — R338 Other retention of urine: Secondary | ICD-10-CM | POA: Diagnosis not present

## 2021-04-23 NOTE — Telephone Encounter (Signed)
Spoke with Estill Bamberg and she states patient was suppose to have an appointment today with Dr. Lamonte Sakai for a lung nodule consult and is not sure why it didn't go through.   Left voicemail for patient to schedule an appointment with Dr. Lamonte Sakai. There is a 2pm nodule slot open tomorrow if patient can come in.

## 2021-04-23 NOTE — Telephone Encounter (Signed)
Patient has been scheduled 04/24/2021 at 2pm with Dr. Lamonte Sakai. Nothing further needed.

## 2021-04-23 NOTE — Telephone Encounter (Signed)
Patient came in because he thought he had an appointment, no appointment was scheduled and no appointment was cancelled. Patient dropped off [place] paperwork to be filled out. Placed in Dr. Cordelia Pen box.

## 2021-04-24 ENCOUNTER — Telehealth: Payer: Self-pay | Admitting: Pulmonary Disease

## 2021-04-24 ENCOUNTER — Ambulatory Visit: Payer: Medicare Other | Admitting: Emergency Medicine

## 2021-04-24 NOTE — Telephone Encounter (Signed)
Please schedule patient to be seen with me, his primary pulmonologist. I am able to discuss his lung nodule as well and arrange diagnostic testing.  I have availability this Friday (04/26/21). Ok to override blocked spots if needed.  Rodman Pickle, M.D. [facility] Pulmonary/Critical Care Medicine 04/24/2021 3:16 PM   See Amion for personal pager For hours between 7 PM to 7 AM, please call [place] for urgent questions

## 2021-04-24 NOTE — Telephone Encounter (Signed)
Called and spoke with pt about the message received from Dr. Loanne Drilling and he said that he can only do Mondays as he does not drive and his daughter is off on Mondays. I have scheduled pt an appt with Dr. Loanne Drilling. Nothing further needed.

## 2021-04-25 ENCOUNTER — Other Ambulatory Visit: Payer: Self-pay | Admitting: *Deleted

## 2021-04-25 NOTE — Patient Outreach (Signed)
[facility] - [facility]) Care Management  [place]  04/25/2021   Chad Gomez [birth year] [id number]  Subjective: Successful telephone outreach call to patient. HIPAA identifiers obtained. Patient states he has upcoming visits with his pulmonologist and oncologist to discuss treatment for lung nodules found on recent PET scan. Patient reports doing well emotionally adding that he is staying upbeat and optimistic. Nurse discussed with patient his health goals and his health and wellness needs which were documented in the Epic system. Patient did not have any further questions or concerns today and did confirm that he has this nurse's contact number to call her if needed.   Encounter Medications:  Outpatient Encounter Medications as of 04/25/2021  Medication Sig Note   acetaminophen (TYLENOL) 500 MG tablet Take 2 tablets (1,000 mg total) by mouth every 6 (six) hours as needed for mild pain.    albuterol (VENTOLIN HFA) 108 (90 Base) MCG/ACT inhaler Inhale 2 puffs into the lungs every 6 (six) hours as needed for wheezing or shortness of breath.    amLODipine (NORVASC) 10 MG tablet Take 10 mg by mouth daily.    aspirin EC 81 MG tablet Take 81 mg by mouth daily.    atorvastatin (LIPITOR) 40 MG tablet Take 40 mg by mouth daily.    Cholecalciferol (VITAMIN D-3) 1000 units CAPS Take 1,000 Units by mouth daily.     ezetimibe (ZETIA) 10 MG tablet Take 5 mg by mouth daily. 02/21/2020: Wife states taking it as needed.    finasteride (PROSCAR) 5 MG tablet TAKE 1 TABLET BY MOUTH  DAILY    fluticasone (FLONASE) 50 MCG/ACT nasal spray Place 2 sprays into both nostrils daily.     folic acid (FOLVITE) 1 MG tablet Take 1 mg by mouth daily. 07/12/2020: Takes One A Day vitamin that has folic acid   gabapentin (NEURONTIN) 100 MG capsule Take 200 mg by mouth 3 (three) times daily. Taking 500mg  daily    indapamide (LOZOL) 1.25 MG tablet Take 1.25 mg by mouth daily.    metFORMIN (GLUCOPHAGE-XR) 500 MG  24 hr tablet Take 1,000 mg by mouth See admin instructions. Take 1000mg  every morning and take 1000 mg at supper    olmesartan (BENICAR) 20 MG tablet Take 20 mg by mouth daily.    potassium citrate (UROCIT-K) 10 MEQ (1080 MG) SR tablet Take 10 mEq by mouth in the morning and at bedtime.     Tiotropium Bromide-Olodaterol (STIOLTO RESPIMAT) 2.5-2.5 MCG/ACT AERS Inhale 2 puffs into the lungs daily.    traMADol (ULTRAM) 50 MG tablet Take 1 tablet (50 mg total) by mouth every 6 (six) hours as needed (mild pain). 07/12/2020: Have not needed this   No facility-administered encounter medications on file as of 04/25/2021.    Functional Status:  No flowsheet data found.  Fall/Depression Screening: Fall Risk  04/25/2021 03/20/2021 12/13/2020  Falls in the past year? 1 1 1   Number falls in past yr: 0 0 0  Injury with Fall? 0 0 0  Comment - - -  Risk for fall due to : Impaired mobility;Impaired balance/gait;History of fall(s) History of fall(s);Impaired mobility;Impaired balance/gait History of fall(s)  Follow up Falls prevention discussed;Education provided;Falls evaluation completed Falls prevention discussed;Education provided;Falls evaluation completed Falls evaluation completed;Education provided;Falls prevention discussed   PHQ 2/9 Scores 04/25/2021 07/12/2020 06/13/2020 05/04/2020 02/21/2020 11/16/2019  PHQ - 2 Score 1 0 1 0 0 -  Exception Documentation - - - - - Other- indicate reason in comment box  Not completed - - - - -  Spoke with wife per patient and (patient's wife / designated party release) request.    Assessment:   Care Plan Care Plan : RN Care Manager Plan of care  Updates made by Michiel Cowboy, RN since 04/25/2021 12:00 AM     Problem: Knowledge Deficit Related to COPD   Priority: High     Long-Range Goal: Development plan of care for management of COPD   Start Date: 04/25/2021  Expected End Date: 05/22/2022  Priority: High  Note:   Current Barriers:  Knowledge Deficits related  to plan of care for management of COPD and follow up for lung nodules discovered on PET scan  RNCM Clinical Goal(s):  Patient will verbalize understanding of plan for management of COPD and for management of lung nodules after upcoming pulmonologist appointment 05/06/21 and oncology appointment 05/20/21 take all medications exactly as prescribed and will call provider for medication related questions attend all scheduled medical appointments: Upcoming pulmonology visit on 05/06/21 , Oncology visit on 05/20/21 demonstrate Ongoing adherence to prescribed treatment plan for COPD and decided upon lung nodule treatments as evidenced by continuation of taking prescribed inhalers as ordered; walking routinely as tolerated; contacting provider as needed for signs and symptoms of COPD exacerbation; following through with the decided upon treatments for the discovered lung nodules.  demonstrate Ongoing health management independence of weigh 3 times weekly, taking B/P daily, and transmitting the values to [facility] who will contact patient to notify his providers for concerning values continue to work with RN Care Manager to address care management and care coordination needs related to  COPD and lung nodule treatment  through collaboration with RN Care manager, provider, and care team.   Interventions: Inter-disciplinary care team collaboration (see longitudinal plan of care) Evaluation of current treatment plan related to  self management and patient's adherence to plan as established by provider   COPD Interventions:   Provided patient with basic written and verbal COPD education on self care/management/and exacerbation prevention; Advised patient to track and manage COPD triggers;  Advised patient to self assesses COPD action plan zone and make appointment with provider if in the yellow zone for 48 hours without improvement; Advised patient to engage in light exercise as tolerated 3-5 days a week to aid in  the the management of COPD; Referral made to community resources care guide team for assistance with ; food insecurity outreach competed previously by community care coordinator and patient request that food bank information be resent to his home; nurse placed this request and no new contact is required at this time; Screening for signs and symptoms of depression related to chronic disease state;  Discussed with patient smoking cessation; patient explains that he is not ready to quit Will send COPD education regarding color zones and rescue action plans Will follow up and assist patient with decided upon procedures and/or treatments for lung nodule      Patient Goals/Self-Care Activities: Patient will self administer medications as prescribed Patient will attend all scheduled provider appointments Patient will call provider office for new concerns or questions Patient will  attend the upcoming appointments with pulmonology and oncology to discuss treatment for lung nodule  Follow Up Plan:  Telephone follow up appointment with care management team member scheduled for:  December       Goals Addressed             This Visit's Progress    COMPLETED: [facility]) Patient will verbalize talking to [facility] Coordinator regarding financial strain within  the next 30 days       Timeframe:  Long-Range Goal Priority:  Medium Start Date:  03/20/21                           Expected End Date: 04/25/21 Follow-up Date: 04/25/21  Notes: 03/20/21: Patient reports experiencing financial strain. His car has broken down and he cannot afford to fix it. He explains his daughter does provide transportation mostly and he does have a few other transportation options. Patient states that when the extra SNAP stops due to covid emergency being lifted he will not be able to afford food. Patient shares that his rent continues to go up and it is difficult to pay for all of his bills. Patient states he would like to  discuss any financial relief options with community care coordinator and he would like to discuss getting on the Meals on Wheels waiting list as it is a hardship to get  to the grocery store and prepare meals. Nurse will refer patient to [facility] for assistance.                     04/25/21: Note showing that [employer] reached out to patient. Patient states that he continues to receive covid relief with SNAP. He request that food bank information be sent to him; explaining he may need it in the future.      COMPLETED: [facility] CM: [place] My Quality of Life       Timeframe:  Long-Range Goal Priority:  Medium Start Date:  05/04/20                           Expected End Date:  04/25/21                  Follow Up Date 04/25/21   - complete a living will - discuss my treatment options with the doctor or nurse - do one enjoyable thing every day - name a health care proxy (decision maker)    Why is this important?   Having a long-term illness can be scary.  It can also be stressful for you and your caregiver.  These steps may help.    Notes:   06/13/20: -- Start pulmonary rehabilitation as soon as you are able- this is a great program that will help you manage your ability to tolerate activity and any shortness of breath you might have  Updated 07/12/20 Patient requested that this nurse send him advance directive documents.  04/25/21: Patient reports that he is satisfied with his quality of life. He states that he is "upbeat and optimistic" about the future.     COMPLETED: [facility] CM: Track and Manage My COPD triggers and symptoms       Timeframe:  Long-Range Goal Priority:  High Start Date: 05/04/20                            Expected End Date: 04/25/21                   Follow Up Date 04/25/21   - begin a symptom diary - develop a rescue plan - eliminate symptom triggers at home - follow rescue plan if symptoms flare-up - keep follow-up appointments     Why is this important?   Tracking your symptoms and other information  about your health helps your doctor plan your care.  Write down the symptoms, the time of day, what you were doing and what medicine you are taking.  You will soon learn how to manage your symptoms.     Notes: Updated 03/20/21: Patient reports that he has recurrent lung cancer. His oncologist has referred him to have a bronchoscopy with biopsy or a CT guided biopsy. The plan is to review the biopsy results with Dr. Julien Nordmann and discuss treatment and options. Patient explains that he is going through the motions of what needs to take place. Nurse provided emotional support and he is well supported by his wife and family. Patient explains that he continues to feel physically well and that his COPD is currently at baseline and under control. Patient states that he is not ready to quit smoking at this time.  Updated 12/13/20: Patient reports having good control with his COPD. He does explain that he became short of breath while working on his car during the hot weather. Nurse discussed limiting time outside in extreme weather when possible.   Updated: 07/12/20 Patient reports that his COPD is well controlled presently. Patient records all of his taken medications and tracks how he feels daily. He monitors his symptoms closely and contacts his providers as indicated per his COPD action plan.   04/25/21: Resolved due to duplicate goals.       Plan: RN Health Coach will send patient COPD and how to weigh yourself education, will request that community care coordinator send information regarding food banks, and will call patient within the month of December. Follow-up: Patient agrees to Care Plan and Follow-up.  [name] RN, BSN [place] [phone] [email or web address]

## 2021-04-25 NOTE — Patient Instructions (Addendum)
Care Plan : RN Care Manager Plan of care  Updates made by Michiel Cowboy, RN since 04/25/2021 12:00 AM     Problem: Knowledge Deficit Related to COPD   Priority: High     Long-Range Goal: Development plan of care for management of COPD   Start Date: 04/25/2021  Expected End Date: 05/22/2022  Priority: High  Note:   Current Barriers:  Knowledge Deficits related to plan of care for management of COPD and follow up for lung nodules discovered on PET scan  RNCM Clinical Goal(s):  Patient will verbalize understanding of plan for management of COPD and for management of lung nodules after upcoming pulmonologist appointment 05/06/21 and oncology appointment 05/20/21 take all medications exactly as prescribed and will call provider for medication related questions attend all scheduled medical appointments: Upcoming pulmonology visit on 05/06/21 , Oncology visit on 05/20/21 demonstrate Ongoing adherence to prescribed treatment plan for COPD and decided upon lung nodule treatments as evidenced by continuation of taking prescribed inhalers as ordered; walking routinely as tolerated; contacting provider as needed for signs and symptoms of COPD exacerbation; following through with the decided upon treatments for the discovered lung nodules.  demonstrate Ongoing health management independence of weigh 3 times weekly, taking B/P daily, and transmitting the values to [facility] who will contact patient to notify his providers for concerning values continue to work with RN Care Manager to address care management and care coordination needs related to  COPD and lung nodule treatment  through collaboration with RN Care manager, provider, and care team.   Interventions: Inter-disciplinary care team collaboration (see longitudinal plan of care) Evaluation of current treatment plan related to  self management and patient's adherence to plan as established by provider   COPD Interventions:   Provided patient with basic  written and verbal COPD education on self care/management/and exacerbation prevention; Advised patient to track and manage COPD triggers;  Advised patient to self assesses COPD action plan zone and make appointment with provider if in the yellow zone for 48 hours without improvement; Advised patient to engage in light exercise as tolerated 3-5 days a week to aid in the the management of COPD; Referral made to community resources care guide team for assistance with ; food insecurity outreach competed previously by community care coordinator and patient request that food bank information be resent to his home; nurse placed this request and no new contact is required at this time; Screening for signs and symptoms of depression related to chronic disease state;  Discussed with patient smoking cessation; patient explains that he is not ready to quit Will send COPD education regarding color zones and rescue action plans Will follow up and assist patient with decided upon procedures and/or treatments for lung nodule      Patient Goals/Self-Care Activities: Patient will self administer medications as prescribed Patient will attend all scheduled provider appointments Patient will call provider office for new concerns or questions Patient will  attend the upcoming appointments with pulmonology and oncology to discuss treatment for lung nodule  Follow Up Plan:  Telephone follow up appointment with care management team member scheduled for:  December

## 2021-04-26 ENCOUNTER — Ambulatory Visit: Payer: Self-pay | Admitting: *Deleted

## 2021-04-29 ENCOUNTER — Telehealth: Payer: Self-pay | Admitting: *Deleted

## 2021-04-29 NOTE — Telephone Encounter (Signed)
   Telephone encounter was:  Successful.  04/29/2021 Name: Chad Gomez MRN: [id number] DOB: 14-Aug-1945  Chad Gomez is a 75 y.o. year old male who is a primary care patient of [name], MD . The community resource team was consulted for assistance with [place] guide performed the following interventions: case manager said Patient did not receive the previously sent  resources , so I forwarded a message to [place] to get that resent .  Follow Up Plan:  No further follow up planned at this time. The patient has been provided with needed resources.  [place], Care Management  [phone] [address] , [place] [address] Email : [email or web address]

## 2021-05-06 ENCOUNTER — Encounter: Payer: Self-pay | Admitting: Pulmonary Disease

## 2021-05-06 ENCOUNTER — Ambulatory Visit: Payer: Medicare Other | Admitting: Pulmonary Disease

## 2021-05-06 ENCOUNTER — Other Ambulatory Visit: Payer: Self-pay

## 2021-05-06 VITALS — BP 138/80 | HR 107 | Temp 97.9°F | Ht 70.0 in | Wt 181.8 lb

## 2021-05-06 DIAGNOSIS — C3492 Malignant neoplasm of unspecified part of left bronchus or lung: Secondary | ICD-10-CM

## 2021-05-06 DIAGNOSIS — J432 Centrilobular emphysema: Secondary | ICD-10-CM

## 2021-05-06 DIAGNOSIS — J449 Chronic obstructive pulmonary disease, unspecified: Secondary | ICD-10-CM | POA: Diagnosis not present

## 2021-05-06 DIAGNOSIS — Z720 Tobacco use: Secondary | ICD-10-CM | POA: Diagnosis not present

## 2021-05-06 MED ORDER — ALBUTEROL SULFATE HFA 108 (90 BASE) MCG/ACT IN AERS: 2.0000 | INHALATION_SPRAY | Freq: Four times a day (QID) | RESPIRATORY_TRACT | 3 refills | Status: DC | PRN

## 2021-05-06 NOTE — Patient Instructions (Addendum)
Stage IIIa adenocarcinoma of the left lung --DISCUSS bronchoscopy with interventional pulmonologists +/- Cardiac Thoracic Surgeon --May need repeat pulmonary function tests and CT imaging --Please call our office if you have not been scheduled by the end of this week  COPD   --CONTINUE Stioloto TWO puffs ONCE a day --CONTINUE Albuterol AS NEEDED for shortness of breath or wheezing  Tobacco Abuse STOP smoking  Follow-up with me in 1 month

## 2021-05-06 NOTE — Progress Notes (Signed)
Subjective:   PATIENT ID: Chad Gomez GENDER: male DOB: July 05, 1945, MRN: [id number]   HPI  Chief Complaint  Patient presents with   Follow-up    Lung nodule    Reason for Visit: Follow-up   Chad Gomez is a 75 year old male active smoker with very severe COPD/emphysema, stage IIIa adenocarcinoma of the left lung s/p resection and chemotherapy (incomplete due to intolerance) who presents with follow-up.   08/21/20 Since our last visit, he reports he is tolerating Stiolto. Rarely uses albuterol He is working on weight gain. Previously did water aerobics pre-pandemic four years ago. Still working on low impact strengthening exercise. Otherwise states he is not that active. Denies cough and wheezing. Has shortness of breath that seems to occur with carrying groceries and upstairs. He still smokes 2 ppd. He states he is interested in Pulmonary Rehab but has not been contacted.  05/06/21 He is compliant with his Stiolto daily. He does not know when to use his albuterol. He reports he has shortness of breath, cough and wheezing with short distances. Continues to be an active smoker.   Social History: Currently smoker 1ppd. Previously 2ppd.  I have personally reviewed patient's past medical/family/social history/allergies/current medications.  Past Medical History:  Diagnosis Date   BPH (benign prostatic hypertrophy)    weak stream   Chronic kidney disease    Complication of anesthesia    urinary retention   COPD (chronic obstructive pulmonary disease) (HCC)    Diabetes mellitus without complication (HCC)    Type II   History of bladder stone    History of colon polyps    08/2013   pre-canerous   History of kidney stones    History of melanoma excision    LEFT EAR   Hyperlipidemia    Hypertension    Iron deficiency    Lung cancer, lingula ([place]) 11/01/2019   Open wound    left lower jaw; followed  by ENT   Peripheral vascular disease ([place])    Right ureteral stone    Shingles 05-15-2015      Outpatient Medications Prior to Visit  Medication Sig Dispense Refill   acetaminophen (TYLENOL) 500 MG tablet Take 2 tablets (1,000 mg total) by mouth every 6 (six) hours as needed for mild pain. 30 tablet 0   amLODipine (NORVASC) 10 MG tablet Take 10 mg by mouth daily.     aspirin EC 81 MG tablet Take 81 mg by mouth daily.     atorvastatin (LIPITOR) 40 MG tablet Take 40 mg by mouth daily.     Cholecalciferol (VITAMIN D-3) 1000 units CAPS Take 1,000 Units by mouth daily.      ezetimibe (ZETIA) 10 MG tablet Take 5 mg by mouth daily.     finasteride (PROSCAR) 5 MG tablet TAKE 1 TABLET BY MOUTH  DAILY 90 tablet 3   fluticasone (FLONASE) 50 MCG/ACT nasal spray Place 2 sprays into both nostrils daily.      folic acid (FOLVITE) 1 MG tablet Take 1 mg by mouth daily.     gabapentin (NEURONTIN) 100 MG capsule Take 200 mg by mouth 3 (three) times daily. Taking 400mg  daily     metFORMIN (GLUCOPHAGE-XR) 500 MG 24 hr tablet Take 1,000 mg by mouth See admin instructions. Take 1000mg  every morning and take 1000 mg at supper     olmesartan (BENICAR) 20 MG tablet Take 20 mg by mouth daily.     potassium citrate (UROCIT-K) 10 MEQ (1080 MG)  SR tablet Take 10 mEq by mouth in the morning and at bedtime.      Tiotropium Bromide-Olodaterol (STIOLTO RESPIMAT) 2.5-2.5 MCG/ACT AERS Inhale 2 puffs into the lungs daily. 12 g 3   traMADol (ULTRAM) 50 MG tablet Take 1 tablet (50 mg total) by mouth every 6 (six) hours as needed (mild pain). 20 tablet 0   albuterol (VENTOLIN HFA) 108 (90 Base) MCG/ACT inhaler Inhale 2 puffs into the lungs every 6 (six) hours as needed for wheezing or shortness of breath. 8 g 3   indapamide (LOZOL) 1.25 MG tablet Take 1.25 mg by mouth daily. (Patient not taking: Reported on 05/06/2021)     No facility-administered medications prior to visit.    Review of Systems  Constitutional:  Negative for  chills, diaphoresis, fever, malaise/fatigue and weight loss.  HENT:  Negative for congestion.   Respiratory:  Positive for shortness of breath. Negative for cough, hemoptysis, sputum production and wheezing.   Cardiovascular:  Negative for chest pain, palpitations and leg swelling.    Objective:   Vitals:   05/06/21 1037  BP: 138/80  Pulse: (!) 107  Temp: 97.9 F (36.6 C)  TempSrc: Oral  SpO2: 93%  Weight: 181 lb 12.8 oz (82.5 kg)  Height: 5\' 10"  (1.778 m)   SpO2: 93 % O2 Device: None (Room air)  Physical Exam: General: Elderly, well-appearing, no acute distress HENT: [place], AT Eyes: EOMI, no scleral icterus Respiratory: Clear to auscultation bilaterally.  No crackles, wheezing or rales Cardiovascular: RRR, -M/R/G, no JVD Extremities:-Edema,-tenderness Neuro: AAO x4, CNII-XII grossly intact Psych: Normal mood, normal affect  Data Reviewed:  Imaging: CT Chest Lung Screen 08/25/16 - RUL apical lung nodule measuring 1.5mm. RLL 87mm calcified granuloma. Moderate emphysema. CT Chest Lung Screen 08/24/17 - Right apical lung nodule measuring 2.33mm. RLL 1.11mm calcified granuloma. Left apical pleural-parenchymal scarring. Moderate emphysema. CT Chest 04/27/20 - S/p left upper lobe wedge resection with surgical staple line. No interval pulmonary nodules development. Emphysema.  CT Chest 11/05/20 - Nodular area adjacent to suture line of LUL resection measuring 2.0x2.4, previously 1.8 x 2.4 cm. New subpleural LUL nodule in anterior aspect measuring 33mm. Subpleural paraverterbral LLL nodule 10 x 42mm. CT Chest 02/05/21 - Nodular area adjacent to suture line stable 1.9 x 2.0. Subpleural LUL with interval growth to 59mm. Subpleural paravertebral LLL nodule with interval grown 1.6 x 1.3 cm PET/CT 02/19/21 - S/p LUL wedge resection. Hypermetabolic nodular soft tissue in LUL and 58mm LUL and paraverterbral LLL lesion measuring 1.6 x 1.3 cm  PFT: 09/02/17 Spirometry FVC 1.6 (36%) FEV1 1.0 (31%) Ratio 63   Interpretation: Very severe obstructive defect    Assessment & Plan:   Discussion: 75 year old male active smoker with COPD and hx stage IIIa adenocarcinoma of lung s/p LUL resection in 2021. He completed two cycles before this was discontinued due to intolerance (enteritis, renal failure causing frequent admissions) and is currently under observation with oncology. Chest imaging in August 2022 demonstrated interval growth of anterior subpleural LUL nodule from 24mm to 23mm and paraverterbral LLL nodule from 1x1cm to 1.6 x 1.3 cm compared to three months ago. Follow-up PET imaging with hypermetabolic activity in these lesions as well as nodular area adjacent to prior LUL resection.  Based on imaging, highly concerning for recurrent lung malignancy. We discussed diagnostic testing including CT-guided biopsy, bronchoscopy and surgery vs conservative management with further imaging. Discussed risk of pneumothorax, bleeding and infection with each procedure. After addressing patient/family's questions, patient wishes to  pursue diagnostic testing via bronchoscopy. We discussed risks and benefits of procedure including infection, bleeding and lung collapse. Patient consented to procedure. Will coordinate additional imaging, testing and procedure.  Stage IIIa adenocarcinoma of the left lung --DISCUSS bronchoscopy with interventional pulmonologists +/- Cardiac Thoracic Surgeon --May need repeat pulmonary function tests and CT imaging --Please call our office if you have not been scheduled by the end of this week  Very severe COPD   --CONTINUE Stioloto TWO puffs ONCE a day --Refer to Pulmonary Rehab. Will contact for referral status  Tobacco abuse Patient is an active smoker. We discussed smoking cessation for 3 minutes. We discussed triggers and stressors and ways to deal with them. We discussed barriers to continued smoking and benefits of smoking cessation.  Health Maintenance Immunization History   Administered Date(s) Administered   DTaP 12/04/2015   Pneumococcal Conjugate-13 05/04/2014, 09/02/2016   Pneumococcal Polysaccharide-23 06/19/2015, 05/28/2020   Tdap 12/04/2015   CT Lung Screen - not qualified  No orders of the defined types were placed in this encounter.  Meds ordered this encounter  Medications   albuterol (VENTOLIN HFA) 108 (90 Base) MCG/ACT inhaler    Sig: Inhale 2 puffs into the lungs every 6 (six) hours as needed for wheezing or shortness of breath.    Dispense:  8 g    Refill:  3    Return in about 1 month (around 06/05/2021).   I have spent a total time of 50-minutes on the day of the appointment reviewing prior documentation, coordinating care and discussing medical diagnosis and plan with the patient/family. Past medical history, allergies, medications were reviewed. Pertinent imaging, labs and tests included in this note have been reviewed and interpreted independently by me.  [place], MD [facility] 05/06/2021 9:07 PM  Office Number [phone]

## 2021-05-08 ENCOUNTER — Telehealth: Payer: Self-pay | Admitting: Pulmonary Disease

## 2021-05-08 ENCOUNTER — Telehealth: Payer: Self-pay

## 2021-05-08 DIAGNOSIS — R911 Solitary pulmonary nodule: Secondary | ICD-10-CM | POA: Insufficient documentation

## 2021-05-08 NOTE — Telephone Encounter (Signed)
Appt cancelled per JE. New appt made for 06/05/21 at 10:00am.   Nothing further needed at this time.

## 2021-05-08 NOTE — Telephone Encounter (Signed)
PCCM:  Case discussed with Dr. Loanne Drilling.  We will plan for navigational bronchoscopy with tissue sampling.  As well as evaluation of the left upper lobe stump site.  Orders placed for bronchoscopy.  Dr. Loanne Drilling has spoke with the patient.  Garner Nash, DO [facility] 05/08/2021 12:45 PM

## 2021-05-08 NOTE — Telephone Encounter (Signed)
-----   Message from [place], MD sent at 05/06/2021  9:20 PM EST ----- Chad Gomez,  I have a 1M active smoker with COPD (FEV1 31% in 2019) and hx stage III adenocarcinoma s/p LUL resection. Only completed two rounds of chemo due to intolerance. Chest imaging stable until May and August 2022 showing interval growth near LUL stump site (2x2cm), subpleural LUL (67mm) and paravertebral LLL (1.6 x 1.3cm). Lesions seem difficult to reach and pt high risk for pneumo. Is he amenable for procedure? Patient ECOG 1.

## 2021-05-08 NOTE — Telephone Encounter (Signed)
-----   Message from [place], MD sent at 05/08/2021  1:55 PM EST ----- Regarding: Pulmonary appts Please cancel all his current pulmonary appointments and reschedule to 06/05/21 to discuss bronchoscopy results.

## 2021-05-09 NOTE — Telephone Encounter (Signed)
Pt was already scheduled for 12/6 at 12:45.  I scheduled CT at [facility] on 11/30 at 1:30.  Pt will go for covid test on 12/2.  I spoke to pt & gave him appt info.

## 2021-05-09 NOTE — Telephone Encounter (Signed)
Called patient but he did not answer. Left message for him to call us back.  

## 2021-05-09 NOTE — Telephone Encounter (Signed)
Patient erroneously called to change his appointments. He needs this appointment to discuss his bronchoscopy results. Please cancel his January appointment and reschedule for him to be seen with me in Pulmonary clinic on 06/05/21 at 10 AM.

## 2021-05-10 NOTE — Telephone Encounter (Signed)
Spoke with the pt  I have cancelled appt with JE for Jan and scheduled appt with JE for 06/05/21 at 10 am.

## 2021-05-11 IMAGING — CT CT CHEST W/ CM
2 of 4 series · 15 of 36 positions shown, 18 images · IV contrast (omnipaque)
Comparison: 09/01/2019

CLINICAL DATA: Restaging non-small cell lung cancer. Status post
left upper lobe wedge resection. Undergoing chemotherapy.

EXAM:
CT CHEST WITH CONTRAST
TECHNIQUE: Multidetector CT imaging of the chest was performed during
intravenous contrast administration.
CONTRAST:  75mL OMNIPAQUE IOHEXOL 300 MG/ML  SOLN

[Series 2: axial st · axial · 0.85mm/px · z∈[-159,+145]mm · 12 of 180 slices shown, 15 images]
[im 14/180  mediastinal]
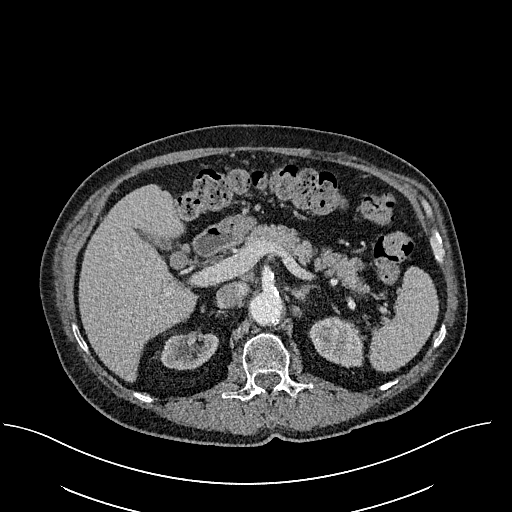
[im 14/180  lung]
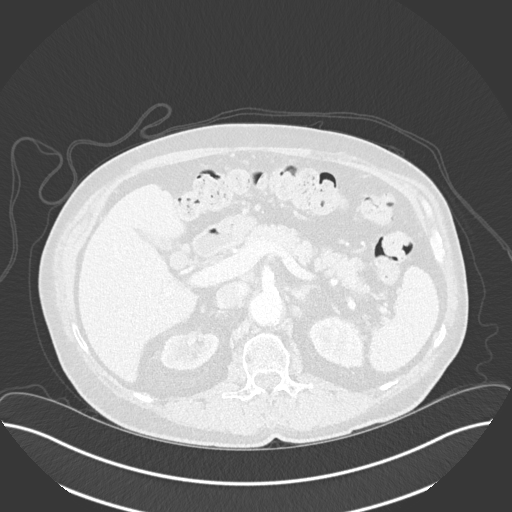
[im 28/180  lung]
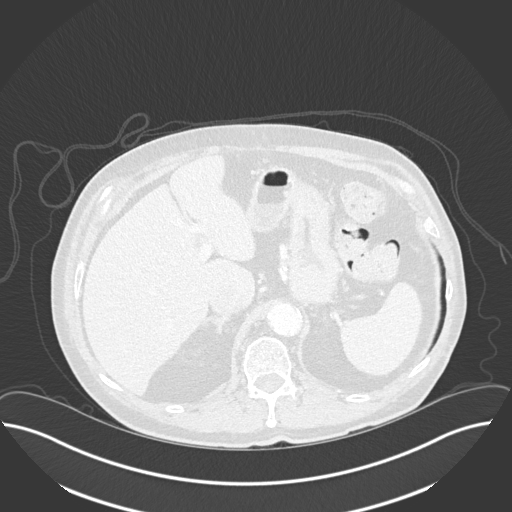
[im 42/180  lung]
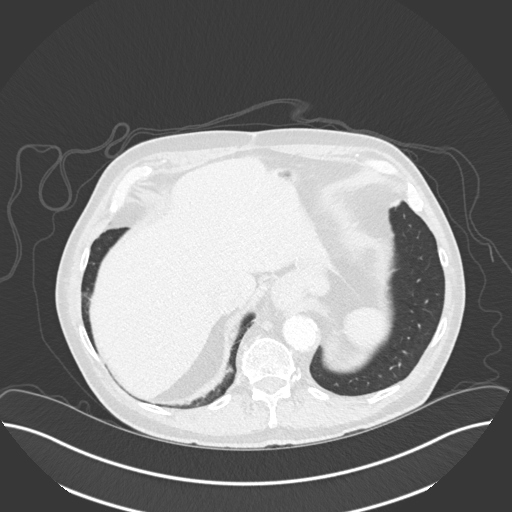
[im 56/180  lung]
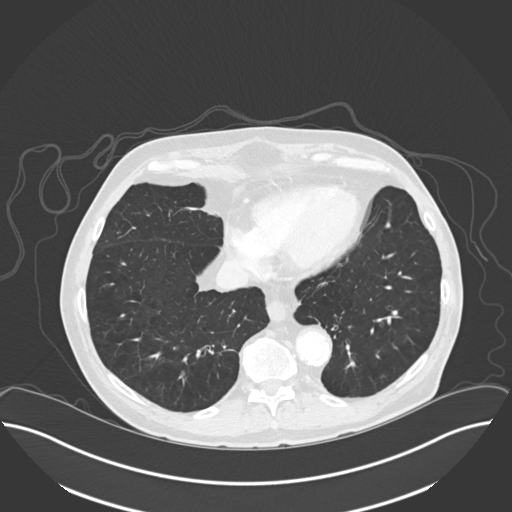
[im 69/180  mediastinal]
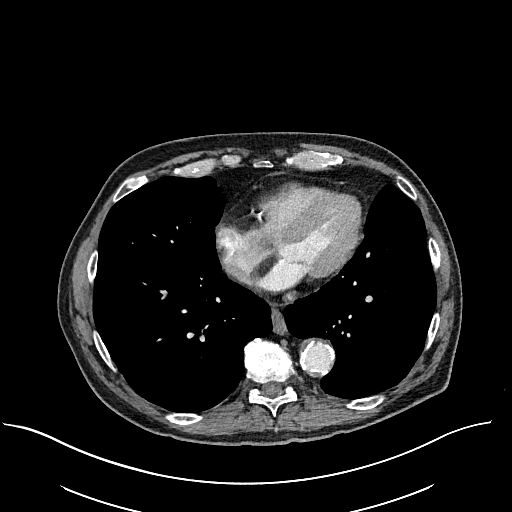
[im 69/180  lung]
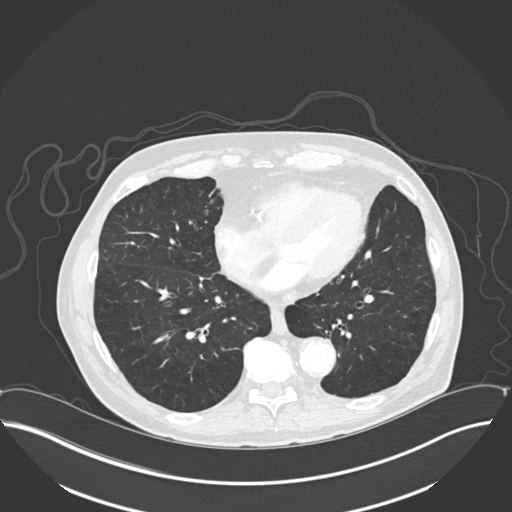
[im 83/180  lung]
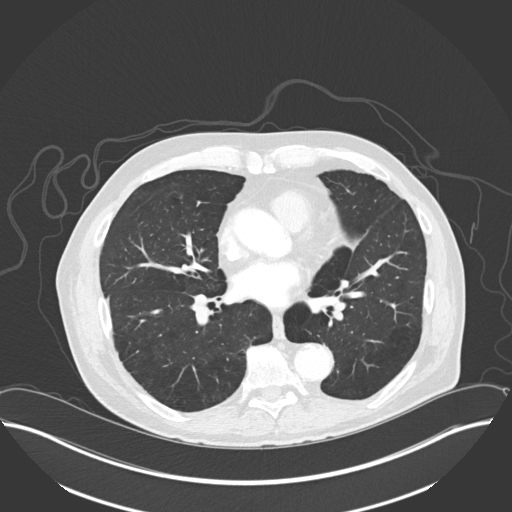
[im 97/180  lung]
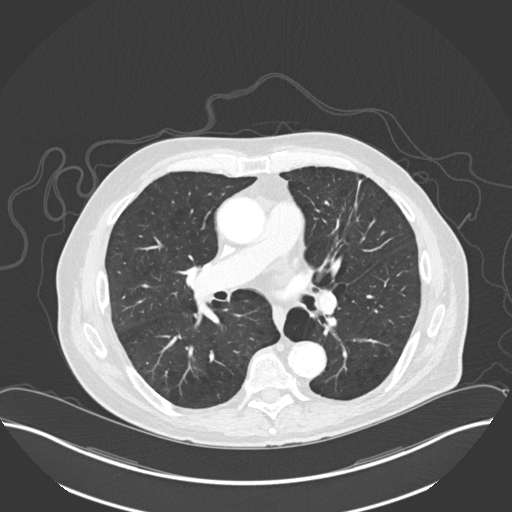
[im 111/180  lung]
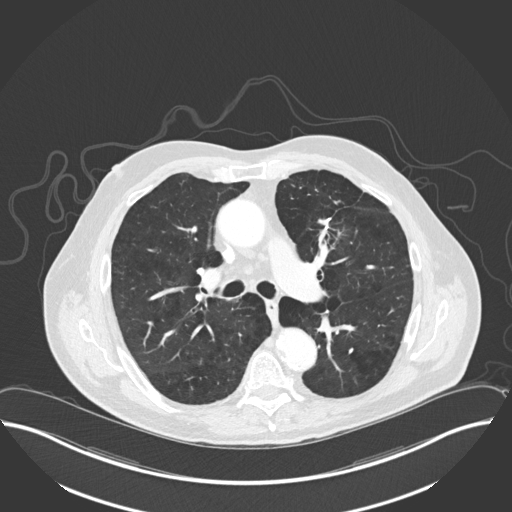
[im 124/180  mediastinal]
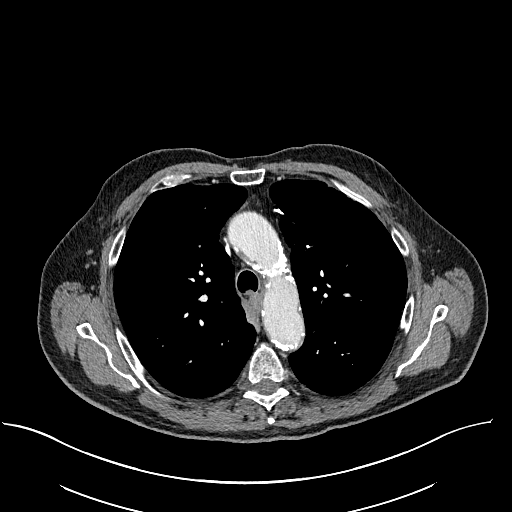
[im 124/180  lung]
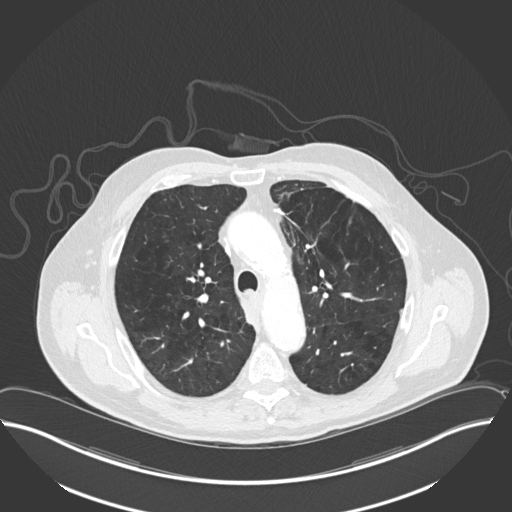
[im 138/180  lung]
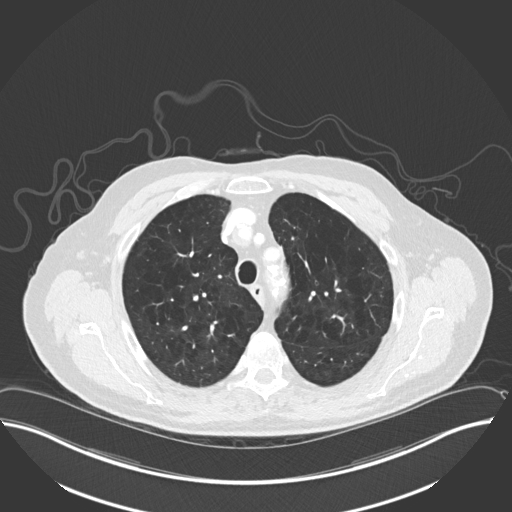
[im 152/180  lung]
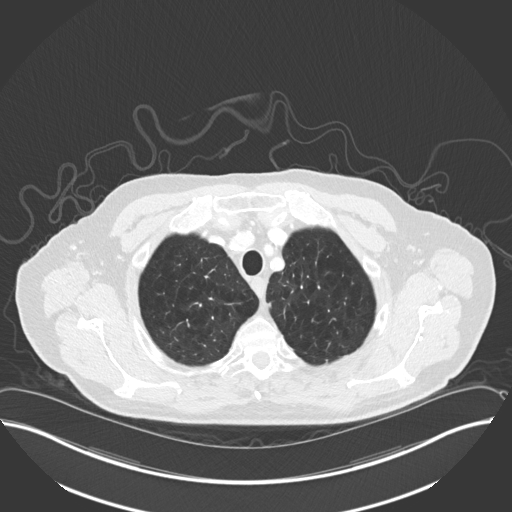
[im 166/180  lung]
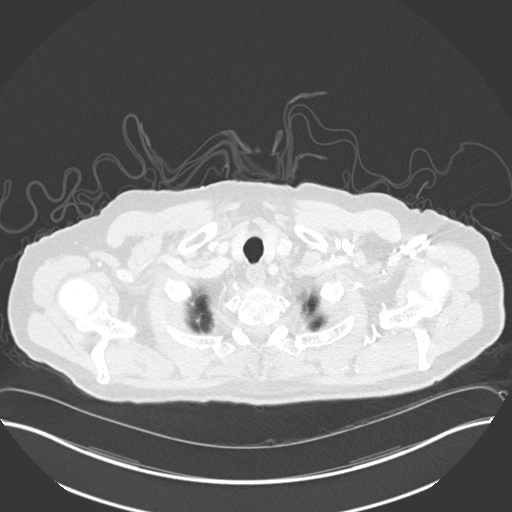

[Series 6: coronal · coronal · 0.73mm/px · 3 of 177 slices shown]
[im 36/177  lung]
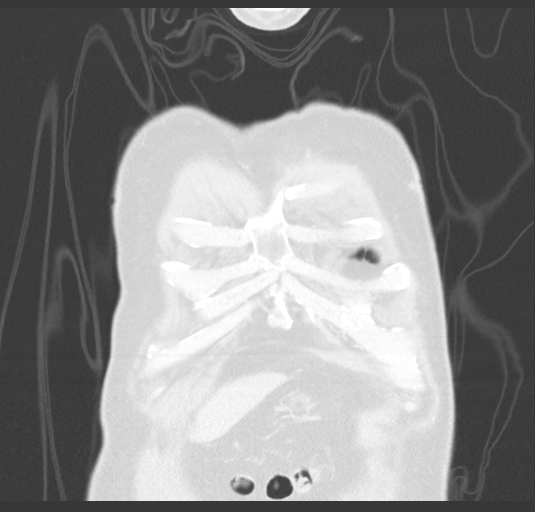
[im 71/177  lung]
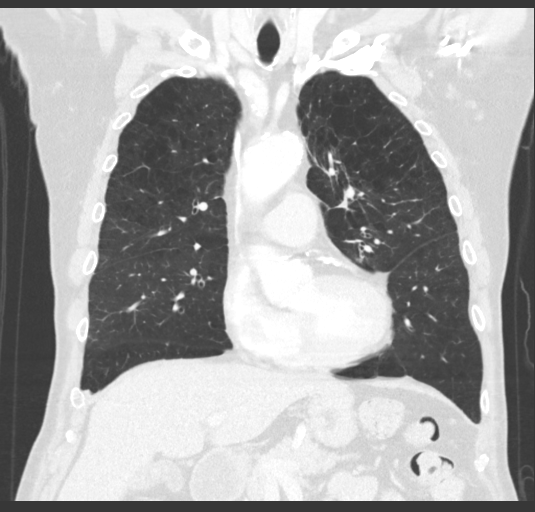
[im 106/177  lung]
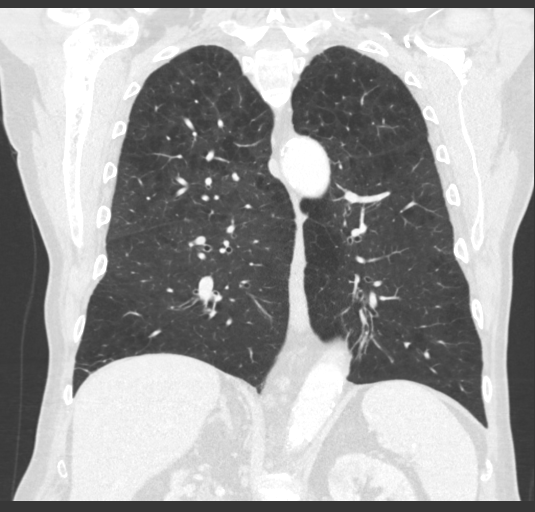

[15 of 36 positions shown; findings below may reference images not displayed]

FINDINGS: Cardiovascular: The heart is normal in size. No pericardial
effusion. Stable tortuosity, mild ectasia and advanced
atherosclerotic calcifications involving the thoracic aorta. No
dissection. The branch vessels are patent. Moderate calcifications.
Calcifications also noted along round the aortic valve.

Mediastinum/Nodes: Small scattered mediastinal and hilar lymph nodes
but no mass or overt adenopathy. The esophagus is grossly normal.
Small hiatal hernia noted.

Lungs/Pleura: Surgical changes from a left upper lobe wedge
resection. Soft tissue density (24 x 18 mm) surrounding the surgical
staple line is not an unexpected finding on this baseline
postoperative study. Attention on future scans is suggested.

Stable underlying emphysematous changes with areas of pulmonary
scarring. No new pulmonary lesions or worrisome pulmonary nodules to
suggest metastatic disease. No pleural effusions or pulmonary edema.
No pneumothorax.

Upper Abdomen: No significant upper abdominal findings. Stable
advanced aortic calcifications, cholelithiasis, small hepatic cysts,
left renal cysts and atrophied right kidney. No adrenal gland
lesions.

Musculoskeletal: No chest wall mass, supraclavicular or axillary
adenopathy.

The bony thorax is intact.
IMPRESSION: 1. Surgical changes from a left upper lobe wedge resection. Soft
tissue density surrounding the surgical staple line is not an
unexpected finding on this baseline postoperative study. Attention
on future scans is suggested.
2. Stable emphysematous changes and pulmonary scarring. No new
pulmonary lesions or worrisome pulmonary nodules to suggest
metastatic disease.
3. Stable advanced atherosclerotic calcifications involving the
thoracic and abdominal aorta and branch vessels.
4. Cholelithiasis.
5. Emphysema and aortic atherosclerosis.

Aortic Atherosclerosis (TMJ8S-O2O.O) and Emphysema (TMJ8S-RD2.1).

Aortic Atherosclerosis (TMJ8S-O2O.O) and Emphysema (TMJ8S-RD2.1).

## 2021-05-13 DIAGNOSIS — E538 Deficiency of other specified B group vitamins: Secondary | ICD-10-CM | POA: Diagnosis not present

## 2021-05-13 DIAGNOSIS — E210 Primary hyperparathyroidism: Secondary | ICD-10-CM | POA: Diagnosis not present

## 2021-05-14 ENCOUNTER — Telehealth: Payer: Self-pay | Admitting: Pulmonary Disease

## 2021-05-14 MED ORDER — STIOLTO RESPIMAT 2.5-2.5 MCG/ACT IN AERS: 2.0000 | INHALATION_SPRAY | Freq: Every day | RESPIRATORY_TRACT | 3 refills | Status: DC

## 2021-05-14 NOTE — Telephone Encounter (Signed)
Prescription printed and faxed to [facility] care.   Call made to patient, confirmed DOB. Made aware script has been sent. Voiced understanding.   Nothing further needed at this time.

## 2021-05-20 ENCOUNTER — Inpatient Hospital Stay: Payer: Medicare Other | Admitting: Internal Medicine

## 2021-05-20 ENCOUNTER — Other Ambulatory Visit: Payer: Self-pay

## 2021-05-20 ENCOUNTER — Inpatient Hospital Stay: Payer: Medicare Other | Attending: Physician Assistant

## 2021-05-20 VITALS — BP 165/89 | HR 100 | Temp 97.3°F | Resp 20 | Ht 70.0 in | Wt 181.6 lb

## 2021-05-20 DIAGNOSIS — Z006 Encounter for examination for normal comparison and control in clinical research program: Secondary | ICD-10-CM

## 2021-05-20 DIAGNOSIS — Z79899 Other long term (current) drug therapy: Secondary | ICD-10-CM | POA: Insufficient documentation

## 2021-05-20 DIAGNOSIS — C3412 Malignant neoplasm of upper lobe, left bronchus or lung: Secondary | ICD-10-CM | POA: Diagnosis not present

## 2021-05-20 DIAGNOSIS — C3492 Malignant neoplasm of unspecified part of left bronchus or lung: Secondary | ICD-10-CM

## 2021-05-20 DIAGNOSIS — I129 Hypertensive chronic kidney disease with stage 1 through stage 4 chronic kidney disease, or unspecified chronic kidney disease: Secondary | ICD-10-CM | POA: Diagnosis not present

## 2021-05-20 DIAGNOSIS — C771 Secondary and unspecified malignant neoplasm of intrathoracic lymph nodes: Secondary | ICD-10-CM | POA: Insufficient documentation

## 2021-05-20 DIAGNOSIS — Z7984 Long term (current) use of oral hypoglycemic drugs: Secondary | ICD-10-CM | POA: Insufficient documentation

## 2021-05-20 DIAGNOSIS — N189 Chronic kidney disease, unspecified: Secondary | ICD-10-CM | POA: Insufficient documentation

## 2021-05-20 DIAGNOSIS — N400 Enlarged prostate without lower urinary tract symptoms: Secondary | ICD-10-CM | POA: Diagnosis not present

## 2021-05-20 DIAGNOSIS — Z7982 Long term (current) use of aspirin: Secondary | ICD-10-CM | POA: Insufficient documentation

## 2021-05-20 DIAGNOSIS — E1122 Type 2 diabetes mellitus with diabetic chronic kidney disease: Secondary | ICD-10-CM | POA: Diagnosis not present

## 2021-05-20 LAB — CBC WITH DIFFERENTIAL (CANCER CENTER ONLY)
Abs Immature Granulocytes: 0.02 10*3/uL (ref 0.00–0.07)
Basophils Absolute: 0.1 10*3/uL (ref 0.0–0.1)
Basophils Relative: 1 %
Eosinophils Absolute: 0.4 10*3/uL (ref 0.0–0.5)
Eosinophils Relative: 4 %
HCT: 46.3 % (ref 39.0–52.0)
Hemoglobin: 15.2 g/dL (ref 13.0–17.0)
Immature Granulocytes: 0 %
Lymphocytes Relative: 14 %
Lymphs Abs: 1.3 10*3/uL (ref 0.7–4.0)
MCH: 28.7 pg (ref 26.0–34.0)
MCHC: 32.8 g/dL (ref 30.0–36.0)
MCV: 87.4 fL (ref 80.0–100.0)
Monocytes Absolute: 0.8 10*3/uL (ref 0.1–1.0)
Monocytes Relative: 9 %
Neutro Abs: 6.5 10*3/uL (ref 1.7–7.7)
Neutrophils Relative %: 72 %
Platelet Count: 262 10*3/uL (ref 150–400)
RBC: 5.3 MIL/uL (ref 4.22–5.81)
RDW: 13.1 % (ref 11.5–15.5)
WBC Count: 9 10*3/uL (ref 4.0–10.5)
nRBC: 0 % (ref 0.0–0.2)

## 2021-05-20 LAB — CMP (CANCER CENTER ONLY)
ALT: 13 U/L (ref 0–44)
AST: 16 U/L (ref 15–41)
Albumin: 3.8 g/dL (ref 3.5–5.0)
Alkaline Phosphatase: 114 U/L (ref 38–126)
Anion gap: 10 (ref 5–15)
BUN: 10 mg/dL (ref 8–23)
CO2: 27 mmol/L (ref 22–32)
Calcium: 10.2 mg/dL (ref 8.9–10.3)
Chloride: 104 mmol/L (ref 98–111)
Creatinine: 0.99 mg/dL (ref 0.61–1.24)
GFR, Estimated: >60 mL/min (ref >60)
Glucose, Bld: 141 mg/dL — ABNORMAL HIGH (ref 70–99)
Potassium: 3.9 mmol/L (ref 3.5–5.1)
Sodium: 141 mmol/L (ref 135–145)
Total Bilirubin: 0.4 mg/dL (ref 0.3–1.2)
Total Protein: 6.8 g/dL (ref 6.5–8.1)

## 2021-05-20 NOTE — Progress Notes (Signed)
[place] Telephone:([phone]) [phone]   Fax:([phone]) [place], [place] [address] [place] [address]  DIAGNOSIS: stage IIIA (T2a, N2, M0) non-small cell lung cancer, adenocarcinoma.  Patient presented with a left upper lobe nodule. He was diagnosed in March 2021   Biomarkers/PDL1:   Detected EGFR mutation in exon 21 Positive for the EGFR P848L mutation in exon 21. No mutation was detected in exons 12, 18, 19, and 20. The clinical significance of P848L in exon 21 is uncertain. This variant has been reported to behave like functionally silent polymorphism. [name], M.M., et al. Mol Cancer 6, 56 (2007). PD-L1 Expression Level: TPS < 1% PD-L1 Expression Status: No Expression NEGATIVE FOR ALK REARRANGEMENT   PRIOR THERAPY: 1) Robotic assisted wedge resection of the left upper lobe under the care of Dr. Servando Snare.  This was performed on 10/28/2019. 2) Adjuvant systemic chemotherapy on the [facility] A 469629 protocol with cisplatin 70 mg/M2, pemetrexed 500 mg/M2 every 3 weeks in addition to pembrolizumab 400 mg IV every 6 weeks.  Start date December 19, 2019.  Status post 2 cycles.  This was discontinued secondary to intolerance.   CURRENT THERAPY:  Observation.  INTERVAL HISTORY: Chad Gomez 75 y.o. male returns to the clinic today for follow-up visit accompanied by his wife.  The patient is feeling fine today with no concerning complaints except for the baseline shortness of breath as well as cough but no chest pain or hemoptysis.  He denied having any fever or chills.  He has no nausea, vomiting, diarrhea or constipation.  He has no headache or visual changes.  He was supposed to have repeat bronchoscopy with biopsy of the suspicious lesion in the lung but this was delayed because of logistic issues.  He is scheduled to see Dr. Loanne Drilling on June 05, 2021 for discussion of the procedure.     MEDICAL HISTORY: Past  Medical History:  Diagnosis Date   BPH (benign prostatic hypertrophy)    weak stream   Chronic kidney disease    Complication of anesthesia    urinary retention   COPD (chronic obstructive pulmonary disease) (HCC)    Diabetes mellitus without complication (HCC)    Type II   History of bladder stone    History of colon polyps    08/2013   pre-canerous   History of kidney stones    History of melanoma excision    LEFT EAR   Hyperlipidemia    Hypertension    Iron deficiency    Lung cancer, lingula ([place]) 11/01/2019   Open wound    left lower jaw; followed by ENT   Peripheral vascular disease ([place])    Right ureteral stone    SBO (small bowel obstruction) ([place]) 12/26/2019   Shingles 05-15-2015    ALLERGIES:  is allergic to gabapentin, morphine and related, tape, allevyn adhesive [wound dressings], amoxicillin, and naproxen.  MEDICATIONS:  Current Outpatient Medications  Medication Sig Dispense Refill   acetaminophen (TYLENOL) 500 MG tablet Take 2 tablets (1,000 mg total) by mouth every 6 (six) hours as needed for mild pain. 30 tablet 0   albuterol (VENTOLIN HFA) 108 (90 Base) MCG/ACT inhaler Inhale 2 puffs into the lungs every 6 (six) hours as needed for wheezing or shortness of breath. 8 g 3   amLODipine (NORVASC) 10 MG tablet Take 10 mg by mouth daily.     aspirin EC 81 MG tablet Take 81  mg by mouth daily.     atorvastatin (LIPITOR) 40 MG tablet Take 40 mg by mouth daily.     Cholecalciferol (VITAMIN D-3) 1000 units CAPS Take 1,000 Units by mouth daily.      ezetimibe (ZETIA) 10 MG tablet Take 5 mg by mouth daily.     finasteride (PROSCAR) 5 MG tablet TAKE 1 TABLET BY MOUTH  DAILY 90 tablet 3   fluticasone (FLONASE) 50 MCG/ACT nasal spray Place 2 sprays into both nostrils daily.      folic acid (FOLVITE) 1 MG tablet Take 1 mg by mouth daily.     gabapentin (NEURONTIN) 100 MG capsule Take 200 mg by mouth 3 (three) times daily. Taking 400mg  daily     indapamide (LOZOL) 1.25 MG  tablet Take 1.25 mg by mouth daily. (Patient not taking: Reported on 05/06/2021)     metFORMIN (GLUCOPHAGE-XR) 500 MG 24 hr tablet Take 1,000 mg by mouth See admin instructions. Take 1000mg  every morning and take 1000 mg at supper     olmesartan (BENICAR) 20 MG tablet Take 20 mg by mouth daily.     potassium citrate (UROCIT-K) 10 MEQ (1080 MG) SR tablet Take 10 mEq by mouth in the morning and at bedtime.      Tiotropium Bromide-Olodaterol (STIOLTO RESPIMAT) 2.5-2.5 MCG/ACT AERS Inhale 2 puffs into the lungs daily. 12 g 3   traMADol (ULTRAM) 50 MG tablet Take 1 tablet (50 mg total) by mouth every 6 (six) hours as needed (mild pain). 20 tablet 0   No current facility-administered medications for this visit.    SURGICAL HISTORY:  Past Surgical History:  Procedure Laterality Date   ABDOMINAL AORTAGRAM  11/01/2014   Procedure: Abdominal Aortagram;  Surgeon: Serafina Mitchell, MD;  Location: [facility];  Service: Cardiovascular;;   AORTA - BILATERAL FEMORAL ARTERY BYPASS GRAFT N/A 12/28/2014   Procedure: AORTOBIFEMORAL BYPASS GRAFT;  Surgeon: Serafina Mitchell, MD;  Location: [facility];  Service: Vascular;  Laterality: N/A;   BRONCHIAL BRUSHINGS  09/06/2019   Procedure: BRONCHIAL BRUSHINGS;  Surgeon: Collene Gobble, MD;  Location: [facility];  Service: Pulmonary;;  left upper lobe nodule   BRONCHIAL WASHINGS  09/06/2019   Procedure: BRONCHIAL WASHINGS;  Surgeon: Collene Gobble, MD;  Location: [facility]OS[facility];  Service: Pulmonary;;   COLONOSCOPY W/ POLYPECTOMY  09-13-2013   CYSTOLITHALOPAXY OF BLADDER STONE/ TRANSRECTAL ULTRASOUND PROSTATE BX  08-19-2005   CYSTOSCOPY W/ RETROGRADES Right 12/02/2012   Procedure: CYSTOSCOPY WITH RETROGRADE PYELOGRAM;  Surgeon: Malka So, MD;  Location: [facility];  Service: Urology;  Laterality: Right;   CYSTOSCOPY W/ RETROGRADES Bilateral 10/13/2013   Procedure: CYSTOSCOPY WITH RETROGRADE PYELOGRAM;  Surgeon: Irine Seal, MD;  Location[facility];  Service: Urology;  Laterality: Bilateral;   CYSTOSCOPY W/ URETERAL STENT PLACEMENT Left 04/11/2014   Procedure: CYSTOSCOPY WITH STENT REPLACEMENT;  Surgeon: Malka So, MD;  Location: [facility];  Service: Urology;  Laterality: Left;   CYSTOSCOPY WITH LITHOLAPAXY Right 12/02/2012   Procedure: CYSTOSCOPY WITH stone extraction;  Surgeon: Malka So, MD;  Location: [facility];  Service: Urology;  Laterality: Right;   CYSTOSCOPY WITH LITHOLAPAXY Right 10/13/2013   Procedure: CYSTOSCOPY WITH LITHOLAPAXY, removal of urethral stone with basket;  Surgeon: Irine Seal, MD;  Location: [facility];  Service: Urology;  Laterality: Right;   CYSTOSCOPY WITH RETROGRADE PYELOGRAM, URETEROSCOPY AND STENT PLACEMENT Left 03/21/2014   Procedure: CYSTOSCOPY WITH RETROGRADE PYELOGRAM, AND  LEFT STENT PLACEMENT;  Surgeon: Alexis Frock, MD;  Location: [facility];  Service: Urology;  Laterality: Left;   CYSTOSCOPY WITH RETROGRADE PYELOGRAM, URETEROSCOPY AND STENT PLACEMENT  09/09/2016   Procedure: CYSTOSCOPY WITH RIGHT  RETROGRADE PYELOGRAM, URETEROSCOPY WITH LASER BASKET EXTRACTION OF STONES  AND STENT PLACEMENT;  Surgeon: Irine Seal, MD;  Location: [facility];  Service: Urology;;   CYSTOSCOPY WITH URETEROSCOPY Left 04/11/2014   Procedure: CYSTOSCOPY WITH URETEROSCOPY/ STONE EXTRACTION;  Surgeon: Malka So, MD;  Location: [name];  Service: Urology;  Laterality: Left;   FINE NEEDLE ASPIRATION  09/06/2019   Procedure: FINE NEEDLE ASPIRATION;  Surgeon: Collene Gobble, MD;  Location: [facility] ENDOSCOPY;  Service: Pulmonary;;  left upper lobe - lung   HOLMIUM LASER APPLICATION Right 5/73/2202   Procedure: HOLMIUM LASER APPLICATION;  Surgeon: Malka So, MD;  Location: [facility];  Service: Urology;  Laterality: Right;   HOLMIUM LASER APPLICATION Left 54/27/0623   Procedure: HOLMIUM LASER APPLICATION;  Surgeon: Malka So, MD;  Location: [facility];  Service: Urology;  Laterality: Left;   HOLMIUM LASER APPLICATION Right 7/62/8315   Procedure: HOLMIUM LASER APPLICATION;  Surgeon: Irine Seal, MD;  Location: [facility];  Service: Urology;  Laterality: Right;   INGUINAL HERNIA REPAIR Right 1954   INTERCOSTAL NERVE BLOCK Left 10/28/2019   Procedure: Intercostal Nerve Block;  Surgeon: [name], MD;  Location: [facility];  Service: Thoracic;  Laterality: Left;   LITHOTRIPSY  2007 & 02/2013   LUNG BIOPSY  09/06/2019   Procedure: LUNG BIOPSY;  Surgeon: Collene Gobble, MD;  Location: [facility] ENDOSCOPY;  Service: Pulmonary;;  left upper lob lung   MANDIBLE FRACTURE SURGERY  1995   MICROLARYNGOSCOPY Right 02/18/2016   Procedure: MICRO DIRECT LARYNGOSCOPY EXCISIONAL BIOPSY OF RIGHT VOCAL CORD MASS;  Surgeon: Leta Baptist, MD;  Location: [place];  Service: ENT;  Laterality: Right;   [place]   left ear   MULTIPLE TOOTH [place]   NEPHROLITHOTOMY Left 01/27/2013   Procedure: NEPHROLITHOTOMY PERCUTANEOUS FIRST LOOK;  Surgeon: Malka So, MD;  Location: [facility];  Service: Urology;  Laterality: Left;   NODE DISSECTION  10/28/2019   Procedure: Node Dissection;  Surgeon: [name], MD;  Location: [place];  Service: Thoracic;;   PERIPHERAL VASCULAR CATHETERIZATION N/A 11/01/2014   Procedure: Lower Extremity Angiography;  Surgeon: Serafina Mitchell, MD;  Location: [place] [facility];  Service: Cardiovascular;  Laterality: N/A;   URETEROSCOPY Right 12/02/2012   Procedure: RIGHT URETEROSCOPY STONE EXTRACTION WITH  STENT PLACEMENT;  Surgeon: Malka So, MD;  Location: [facility];  Service: Urology;  Laterality: Right;   VIDEO BRONCHOSCOPY  10/28/2019   VIDEO BRONCHOSCOPY WITH ENDOBRONCHIAL NAVIGATION WITH LUNG MARKINGS (N/A   VIDEO BRONCHOSCOPY WITH ENDOBRONCHIAL NAVIGATION N/A 09/06/2019   Procedure: VIDEO BRONCHOSCOPY WITH ENDOBRONCHIAL NAVIGATION;   Surgeon: Collene Gobble, MD;  Location: [place] ENDOSCOPY;  Service: Pulmonary;  Laterality: N/A;   VIDEO BRONCHOSCOPY WITH ENDOBRONCHIAL NAVIGATION N/A 10/28/2019   Procedure: VIDEO BRONCHOSCOPY WITH ENDOBRONCHIAL NAVIGATION WITH LUNG MARKINGS;  Surgeon: [name], MD;  Location: [place];  Service: Thoracic;  Laterality: N/A;   WEDGE RESECTION  10/28/2019   LEFT UPPER LOBE    REVIEW OF SYSTEMS:  A comprehensive review of systems was negative except for: Respiratory: positive for cough and dyspnea on exertion   PHYSICAL EXAMINATION: General appearance: alert, cooperative,  fatigued, and no distress Head: Normocephalic, without obvious abnormality, atraumatic Neck: no adenopathy, no JVD, supple, symmetrical, trachea midline, and thyroid not enlarged, symmetric, no tenderness/mass/nodules Lymph nodes: Cervical, supraclavicular, and axillary nodes normal. Resp: clear to auscultation bilaterally Back: symmetric, no curvature. ROM normal. No CVA tenderness. Cardio: regular rate and rhythm, S1, S2 normal, no murmur, click, rub or gallop GI: Large abdominal hernia Extremities: extremities normal, atraumatic, no cyanosis or edema  ECOG PERFORMANCE STATUS: 1 - Symptomatic but completely ambulatory  Blood pressure (!) 165/89, pulse 100, temperature (!) 97.3 F (36.3 C), temperature source Tympanic, resp. rate 20, height _0  (1.778 m), weight 181 lb 9.6 oz (82.4 kg), SpO2 92 %.  LABORATORY DATA: Lab Results  Component Value Date   WBC 9.0 05/20/2021   HGB 15.2 05/20/2021   HCT 46.3 05/20/2021   MCV 87.4 05/20/2021   PLT 262 05/20/2021      Chemistry      Component Value Date/Time   NA 140 02/05/2021 0940   K 3.9 02/05/2021 0940   CL 103 02/05/2021 0940   CO2 27 02/05/2021 0940   BUN 10 02/05/2021 0940   CREATININE 1.13 02/05/2021 0940      Component Value Date/Time   CALCIUM 10.6 (H) 02/05/2021 0940   ALKPHOS 94 02/05/2021 0940   AST 16 02/05/2021 0940   ALT 17 02/05/2021 0940    BILITOT 0.6 02/05/2021 0940       RADIOGRAPHIC STUDIES: No results found.   ASSESSMENT AND PLAN: This is a very pleasant 75 years old white male recently diagnosed with stage IIIa (T2 a, N2, M0) non-small cell lung cancer, adenocarcinoma presented with left upper lobe lung nodule and after resection the patient was found to have metastatic disease to the mediastinal lymphadenopathy.  This was diagnosed in March 2021.   He is currently undergoing adjuvant systemic chemotherapy with cisplatin, pemetrexed and pembrolizumab status post 2 cycles. His treatment was discontinued secondary to significant intolerability to this treatment with frequent admission to the hospital with enteritis, acute renal failure as well as pancytopenia. The patient is currently on observation and he is feeling fine today with no concerning complaints except for mild fatigue. He had repeat CT scan of the chest performed recently.  I personally and independently reviewed the scan images and discussed the results with the patient and his wife today. Unfortunately his scan showed increase in the size of a peripheral nodule within the left upper lobe suspicious for new site of disease. His PET scan showed focal nodular soft tissue adjacent to the left upper lobe postsurgical changes with hypermetabolic activity.  There was also hypermetabolic peripheral left upper lobe nodule and multiple hypermetabolic left pleural nodules the largest located within the paravertebral aspect of the posterior left lower lobe with new trace left pleural effusion.  But no evidence of metastatic disease in the abdomen or pelvis. The patient was referred to pulmonary medicine for consideration of bronchoscopy and biopsy of one of the pulmonary nodule.  He was seen by Dr. Loanne Drilling and scheduled to see her again on June 05, 2021 for discussion of the procedure. I will arrange for the patient to come back for follow-up visit in 1 months for  evaluation and discussion of his treatment options based on the final pathology. He was advised to call immediately if he has any other concerning symptoms in the interval. The patient voices understanding of current disease status and treatment options and is in agreement with the current care plan.  All questions were answered. The patient knows to call the clinic with any problems, questions or concerns. We can certainly see the patient much sooner if necessary.  Disclaimer: This note was dictated with voice recognition software. Similar sounding words can inadvertently be transcribed and may not be corrected upon review.

## 2021-05-22 ENCOUNTER — Other Ambulatory Visit: Payer: Self-pay

## 2021-05-22 ENCOUNTER — Ambulatory Visit (INDEPENDENT_AMBULATORY_CARE_PROVIDER_SITE_OTHER)
Admission: RE | Admit: 2021-05-22 | Discharge: 2021-05-22 | Disposition: A | Discharge status: Home / Self Care | Payer: Medicare Other | Source: Ambulatory Visit | Attending: Pulmonary Disease | Admitting: Pulmonary Disease

## 2021-05-22 DIAGNOSIS — J90 Pleural effusion, not elsewhere classified: Secondary | ICD-10-CM | POA: Diagnosis not present

## 2021-05-22 DIAGNOSIS — R911 Solitary pulmonary nodule: Secondary | ICD-10-CM | POA: Diagnosis not present

## 2021-05-22 DIAGNOSIS — J439 Emphysema, unspecified: Secondary | ICD-10-CM | POA: Diagnosis not present

## 2021-05-22 DIAGNOSIS — C3490 Malignant neoplasm of unspecified part of unspecified bronchus or lung: Secondary | ICD-10-CM | POA: Diagnosis not present

## 2021-05-22 DIAGNOSIS — I700 Atherosclerosis of aorta: Secondary | ICD-10-CM | POA: Diagnosis not present

## 2021-05-23 ENCOUNTER — Telehealth: Payer: Self-pay | Admitting: Pulmonary Disease

## 2021-05-23 NOTE — Telephone Encounter (Signed)
Patient is returning phone call. Patient phone number is [phone].

## 2021-05-23 NOTE — Telephone Encounter (Signed)
Call made to patient, confirmed DOB. Patient has already spoke with JE and was aware of appt.    JE please advise patient wanting to know if he still needs to have his covid test done tomorrow?

## 2021-05-23 NOTE — Telephone Encounter (Signed)
Called and spoke with patient. He is aware that he does not need a covid test tomorrow.   Nothing further needed at time of call.

## 2021-05-23 NOTE — Telephone Encounter (Signed)
No, COVID test is not needed

## 2021-05-23 NOTE — Telephone Encounter (Signed)
[facility] Telephone Note  I was contacted by Dr. Valeta Harms regarding recent chest imaging on 05/22/21. Due to interval development of left pleural effusion (presumed malignant), LLL is unable to be visualized for navigational bronchoscopy.  I have called and updated patient regarding results and recommended thoracentesis for diagnostic testing.   Staff, please arrange the following: 1) Schedule patient with me for 30 min slot on 05/30/21 at 10:45 AM for thoracentesis 2) Cancel 06/05/21 appointment  Rodman Pickle, M.D. [facility] Pulmonary/Critical Care Medicine 05/23/2021 9:19 AM

## 2021-05-23 NOTE — Telephone Encounter (Signed)
Lmtcb for pt.  

## 2021-05-27 ENCOUNTER — Other Ambulatory Visit: Payer: Self-pay | Admitting: *Deleted

## 2021-05-27 NOTE — Patient Outreach (Addendum)
[facility]) Care Management  05/27/2021  DAJION BICKFORD February 20, 1946 [id number]  Unsuccessful outreach attempt made to patient. RN Health Coach left HIPAA compliant voicemail message along with her contact information.  Plan: RN Health Coach will call patient within the month of January.  [name] RN, BSN [place] [phone] [email or web address]

## 2021-05-28 ENCOUNTER — Encounter (HOSPITAL_COMMUNITY): Payer: Self-pay

## 2021-05-28 ENCOUNTER — Ambulatory Visit (HOSPITAL_COMMUNITY): Admit: 2021-05-28 | Payer: Medicare Other | Admitting: Pulmonary Disease

## 2021-05-28 ENCOUNTER — Ambulatory Visit: Payer: Medicare Other | Admitting: Pulmonary Disease

## 2021-05-28 DIAGNOSIS — R911 Solitary pulmonary nodule: Secondary | ICD-10-CM

## 2021-05-28 SURGERY — BRONCHOSCOPY, WITH BIOPSY USING ELECTROMAGNETIC NAVIGATION
Anesthesia: General | Laterality: Left

## 2021-05-30 ENCOUNTER — Ambulatory Visit (INDEPENDENT_AMBULATORY_CARE_PROVIDER_SITE_OTHER): Payer: Medicare Other

## 2021-05-30 ENCOUNTER — Encounter: Payer: Self-pay | Admitting: Pulmonary Disease

## 2021-05-30 ENCOUNTER — Other Ambulatory Visit: Payer: Self-pay

## 2021-05-30 ENCOUNTER — Other Ambulatory Visit (HOSPITAL_COMMUNITY)
Admission: RE | Admit: 2021-05-30 | Discharge: 2021-05-30 | Disposition: A | Discharge status: Home / Self Care | Payer: Medicare Other | Source: Ambulatory Visit | Attending: Pulmonary Disease | Admitting: Pulmonary Disease

## 2021-05-30 ENCOUNTER — Ambulatory Visit (INDEPENDENT_AMBULATORY_CARE_PROVIDER_SITE_OTHER): Payer: Medicare Other | Admitting: Pulmonary Disease

## 2021-05-30 ENCOUNTER — Other Ambulatory Visit (HOSPITAL_COMMUNITY)
Admission: RE | Admit: 2021-05-30 | Discharge: 2021-05-30 | Disposition: A | Discharge status: Home / Self Care | Payer: Medicare Other | Attending: Pulmonary Disease | Admitting: Pulmonary Disease

## 2021-05-30 VITALS — BP 130/72 | HR 102 | Temp 97.9°F | Ht 70.0 in | Wt 178.8 lb

## 2021-05-30 DIAGNOSIS — C3492 Malignant neoplasm of unspecified part of left bronchus or lung: Secondary | ICD-10-CM | POA: Insufficient documentation

## 2021-05-30 DIAGNOSIS — R911 Solitary pulmonary nodule: Secondary | ICD-10-CM

## 2021-05-30 DIAGNOSIS — J929 Pleural plaque without asbestos: Secondary | ICD-10-CM | POA: Diagnosis not present

## 2021-05-30 DIAGNOSIS — R918 Other nonspecific abnormal finding of lung field: Secondary | ICD-10-CM | POA: Diagnosis not present

## 2021-05-30 DIAGNOSIS — J90 Pleural effusion, not elsewhere classified: Secondary | ICD-10-CM | POA: Diagnosis not present

## 2021-05-30 DIAGNOSIS — J910 Malignant pleural effusion: Secondary | ICD-10-CM | POA: Diagnosis not present

## 2021-05-30 LAB — GLUCOSE, PLEURAL OR PERITONEAL FLUID: Glucose, Fluid: 104 mg/dL

## 2021-05-30 LAB — LACTATE DEHYDROGENASE, PLEURAL OR PERITONEAL FLUID: LD, Fluid: 128 U/L — ABNORMAL HIGH (ref 3–23)

## 2021-05-30 LAB — AMYLASE, PLEURAL OR PERITONEAL FLUID: Amylase, Fluid: 44 U/L

## 2021-05-30 LAB — BODY FLUID CELL COUNT WITH DIFFERENTIAL
Eos, Fluid: 2 %
Lymphs, Fluid: 89 %
Monocyte-Macrophage-Serous Fluid: 7 % — ABNORMAL LOW (ref 50–90)
Neutrophil Count, Fluid: 2 % (ref 0–25)
Total Nucleated Cell Count, Fluid: 995 cu mm (ref 0–1000)

## 2021-05-30 LAB — ALBUMIN, PLEURAL OR PERITONEAL FLUID: Albumin, Fluid: 2.9 g/dL

## 2021-05-30 LAB — PROTEIN, PLEURAL OR PERITONEAL FLUID: Total protein, fluid: 4.2 g/dL

## 2021-05-30 NOTE — Progress Notes (Signed)
Subjective:   PATIENT ID: Chad Gomez GENDER: male DOB: March 21, 1946, MRN: [id number]   HPI  Chief Complaint  Patient presents with   Follow-up    Patient is here for a thoracentesis    Reason for Visit: Follow-up   [name] is a 75 year old male active smoker with very severe COPD/emphysema, stage IIIa adenocarcinoma of the left lung s/p resection and chemotherapy (incomplete due to intolerance) who presents with follow-up.   08/21/20 Since our last visit, he reports he is tolerating Stiolto. Rarely uses albuterol He is working on weight gain. Previously did water aerobics pre-pandemic four years ago. Still working on low impact strengthening exercise. Otherwise states he is not that active. Denies cough and wheezing. Has shortness of breath that seems to occur with carrying groceries and upstairs. He still smokes 2 ppd. He states he is interested in Pulmonary Rehab but has not been contacted.  05/06/21 He is compliant with his Stiolto daily. He does not know when to use his albuterol. He reports he has shortness of breath, cough and wheezing with short distances. Continues to be an active smoker.   05/30/21 Since our last visit, he was scheduled for navigational bronchoscopy however CT scan prior to procedure demonstrated interval development of left pleural effusion. I contacted him to schedule thoracentesis for therapeutic and diagnostic testing. He has had worsening shortness of breath over these last few weeks. No wheezing or cough.  Social History: Currently smoker 1ppd. Previously 2ppd.  Past Medical History:  Diagnosis Date   BPH (benign prostatic hypertrophy)    weak stream   Chronic kidney disease    Complication of anesthesia    urinary retention   COPD (chronic obstructive pulmonary disease) (HCC)    Diabetes mellitus without complication (HCC)    Type II   History of bladder stone     History of colon polyps    08/2013   pre-canerous   History of kidney stones    History of melanoma excision    LEFT EAR   Hyperlipidemia    Hypertension    Iron deficiency    Lung cancer, lingula ([place]) 11/01/2019   Open wound    left lower jaw; followed by ENT   Peripheral vascular disease ([place])    Right ureteral stone    SBO (small bowel obstruction) ([place]) 12/26/2019   Shingles 05-15-2015      Outpatient Medications Prior to Visit  Medication Sig Dispense Refill   acetaminophen (TYLENOL) 500 MG tablet Take 2 tablets (1,000 mg total) by mouth every 6 (six) hours as needed for mild pain. 30 tablet 0   albuterol (VENTOLIN HFA) 108 (90 Base) MCG/ACT inhaler Inhale 2 puffs into the lungs every 6 (six) hours as needed for wheezing or shortness of breath. 8 g 3   amLODipine (NORVASC) 10 MG tablet Take 10 mg by mouth daily.     aspirin EC 81 MG tablet Take 81 mg by mouth daily.     atorvastatin (LIPITOR) 40 MG tablet Take 40 mg by mouth daily.     Cholecalciferol (VITAMIN D-3) 1000 units CAPS Take 1,000 Units by mouth daily.      ezetimibe (ZETIA) 10 MG tablet Take 5 mg by mouth daily.     finasteride (PROSCAR) 5 MG tablet TAKE 1 TABLET BY MOUTH  DAILY 90 tablet 3   fluticasone (FLONASE) 50 MCG/ACT nasal spray Place 2 sprays into both nostrils daily as needed for allergies or rhinitis.  folic acid (FOLVITE) 481 MCG tablet Take 400 mcg by mouth daily.     gabapentin (NEURONTIN) 100 MG capsule Take 100-200 mg by mouth See admin instructions. Take 200 in the morning, 100 mg in the afternoon and bedtime     metFORMIN (GLUCOPHAGE-XR) 500 MG 24 hr tablet Take 1,000 mg by mouth 2 (two) times daily. Morning and evening     Multiple Vitamins-Minerals (MULTIVITAMIN WITH MINERALS) tablet Take 1 tablet by mouth daily.     olmesartan (BENICAR) 40 MG tablet Take 40 mg by mouth daily.     potassium citrate (UROCIT-K) 10 MEQ (1080 MG) SR tablet Take 10 mEq by mouth 3 (three) times daily with meals.      Tiotropium Bromide-Olodaterol (STIOLTO RESPIMAT) 2.5-2.5 MCG/ACT AERS Inhale 2 puffs into the lungs daily. 12 g 3   traMADol (ULTRAM) 50 MG tablet Take 1 tablet (50 mg total) by mouth every 6 (six) hours as needed (mild pain). 20 tablet 0   No facility-administered medications prior to visit.    Review of Systems  Constitutional:  Negative for chills, diaphoresis, fever, malaise/fatigue and weight loss.  HENT:  Negative for congestion.   Respiratory:  Positive for shortness of breath. Negative for cough, hemoptysis, sputum production and wheezing.   Cardiovascular:  Negative for chest pain, palpitations and leg swelling.    Objective:   Vitals:   05/30/21 1056  BP: 130/72  Pulse: (!) 102  Temp: 97.9 F (36.6 C)  TempSrc: Oral  SpO2: 91%  Weight: 178 lb 12.8 oz (81.1 kg)  Height: 5\' 10"  (1.778 m)   SpO2: 91 % O2 Device: None (Room air)  Physical Exam: General: Chronically ill-appearing, no acute distress HENT: [place], AT Eyes: EOMI, no scleral icterus Respiratory: Decreased left lung air entry. Normal right breath sounds  No crackles, wheezing or rales Cardiovascular: RRR, -M/R/G, no JVD Extremities:-Edema,-tenderness Neuro: AAO x4, CNII-XII grossly intact Psych: Normal mood, normal affect  Data Reviewed:  Imaging: CT Chest Lung Screen 08/25/16 - RUL apical lung nodule measuring 1.35mm. RLL 19mm calcified granuloma. Moderate emphysema. CT Chest Lung Screen 08/24/17 - Right apical lung nodule measuring 2.62mm. RLL 1.51mm calcified granuloma. Left apical pleural-parenchymal scarring. Moderate emphysema. CT Chest 04/27/20 - S/p left upper lobe wedge resection with surgical staple line. No interval pulmonary nodules development. Emphysema.  CT Chest 11/05/20 - Nodular area adjacent to suture line of LUL resection measuring 2.0x2.4, previously 1.8 x 2.4 cm. New subpleural LUL nodule in anterior aspect measuring 29mm. Subpleural paraverterbral LLL nodule 10 x 58mm. CT Chest 02/05/21 -  Nodular area adjacent to suture line stable 1.9 x 2.0. Subpleural LUL with interval growth to 30mm. Subpleural paravertebral LLL nodule with interval grown 1.6 x 1.3 cm PET/CT 02/19/21 - S/p LUL wedge resection. Hypermetabolic nodular soft tissue in LUL and 68mm LUL and paraverterbral LLL lesion measuring 1.6 x 1.3 cm CT Chest 05/22/21 - Interval development of moderate left pleural effusion CXR 05/30/21 - S/p thoracentesis. No pneumothorax. Partially loculated left pleural fluid, anterior>posterior  PFT: 09/02/17 Spirometry FVC 1.6 (36%) FEV1 1.0 (31%) Ratio 63  Interpretation: Very severe obstructive defect  08/08/19 FVC 3.65 (87%) FEV1 1.83 (59%) Ratio 53  TLC 78% DLCO 64% Interpretation: Moderately severe mixed obstructive and restrictive defect with mildly reduced DLCO. Significant bronchodilator response in FEV1 and FVC.  Procedure:   Thoracentesis  Procedure Note  Date:05/30/21 Time:11:30 AM  Provider Performing:Heavin Sebree Rodman Pickle   Procedure: Thoracentesis with imaging guidance (85631)  Indication(s) Pleural Effusion  Consent Risks  of the procedure as well as the alternatives and risks of each were explained to the patient and/or caregiver.  Consent for the procedure was obtained and is signed in the bedside chart  Anesthesia Topical only with 1% lidocaine    Time Out Verified patient identification, verified procedure, site/side was marked, verified correct patient position, special equipment/implants available, medications/allergies/relevant history reviewed, required imaging and test results available.   Sterile Technique Maximal sterile technique including full sterile barrier drape, hand hygiene, sterile gown, sterile gloves, mask, hair covering, sterile ultrasound probe cover (if used).  Procedure Description Ultrasound was used to identify appropriate pleural anatomy for placement and overlying skin marked.  Area of drainage cleaned and draped in sterile fashion.  Lidocaine was used to anesthetize the skin and subcutaneous tissue.  1100 cc's of maroon colored fluid was drained from the left pleural space. Catheter then removed and bandaid applied to site.     Complications/Tolerance None; patient tolerated the procedure well. Chest X-ray is ordered to confirm no post-procedural complication.   EBL Minimal   Specimen(s) Pleural fluid    Assessment & Plan:   Discussion: 75 year old male active smoker with COPD and hx stage IIIa adenocarcinoma of lung s/p LUL resection in 2021. He completed two cycles before this was discontinued due to intolerance (enteritis, renal failure causing frequent admissions) and is currently under observation with oncology. Chest imaging in August 2022 demonstrated interval growth of anterior subpleural LUL nodule from 37mm to 33mm and paraverterbral LLL nodule from 1x1cm to 1.6 x 1.3 cm compared to three months ago. Follow-up PET imaging with hypermetabolic activity in these lesions as well as nodular area adjacent to prior LUL resection.  Stage IIIa adenocarcinoma of the left lung Suspected malignant left pleural effusion --S/p thoracentesis 05/30/21 with 1.1L maroon-colored fluid removed. No immediate post-procedure complications --F/u pleural studies: cell count, culture, cytology, LDH and protein --With residual pleural volume, patient would benefit from PleurX catheter placement. Will coordinate with Pulmonary consult team for next week  Very severe COPD   --CONTINUE Stioloto TWO puffs ONCE a day  Tobacco abuse --Active smoker  Health Maintenance Immunization History  Administered Date(s) Administered   DTaP 12/04/2015   Pfizer Covid-19 Vaccine Bivalent Booster 10yrs & up 08/21/2015, 05/13/2021   Pneumococcal Conjugate-13 05/04/2014, 09/02/2016   Pneumococcal Polysaccharide-23 06/19/2015, 05/28/2020   Tdap 12/04/2015   CT Lung Screen - not qualified  Orders Placed This Encounter  Procedures   DG Chest  2 View    Standing Status:   Future    Number of Occurrences:   1    Standing Expiration Date:   05/30/2022    Order Specific Question:   Reason for Exam (SYMPTOM  OR DIAGNOSIS REQUIRED)    Answer:   thoracentesis    Order Specific Question:   Preferred imaging location?    Answer:   Internal   Body fluid cell count with differential    Standing Status:   Future    Number of Occurrences:   1    Standing Expiration Date:   05/30/2022   Protein, total    Standing Status:   Future    Number of Occurrences:   1    Standing Expiration Date:   05/30/2022   Lactate dehydrogenase    Standing Status:   Future    Number of Occurrences:   1    Standing Expiration Date:   05/30/2022   Glucose, random    Standing Status:   Future  Number of Occurrences:   1    Standing Expiration Date:   05/30/2022   Lactate dehydrogenase, fluid - peritoneal or pleural    Standing Status:   Future    Number of Occurrences:   1    Standing Expiration Date:   05/30/2022   Lactate dehydrogenase    Standing Status:   Future    Standing Expiration Date:   05/30/2022   No orders of the defined types were placed in this encounter.   No follow-ups on file. After PleurX placement  I have spent a total time of 36-minutes on the day of the appointment reviewing prior documentation, coordinating care and discussing medical diagnosis and plan with the patient/family. Past medical history, allergies, medications were reviewed. Pertinent imaging, labs and tests included in this note have been reviewed and interpreted independently by me.  [place], MD [facility] 05/30/2021 3:09 PM  Office Number [phone]

## 2021-05-30 NOTE — Patient Instructions (Signed)
Please see After Care instructions.  We will arrange for PleurX Catheter placement in 1-2 weeks. We will call when your test results are available

## 2021-05-30 NOTE — H&P (View-Only) (Signed)
Subjective:   PATIENT ID: Chad Gomez GENDER: Gomez DOB: [birth year], MRN: [id number]   HPI  Chief Complaint  Patient presents with   Follow-up    Patient is here for a thoracentesis    Reason for Visit: Follow-up   Chad Gomez is a 75 year old Gomez active smoker with very severe COPD/emphysema, stage IIIa adenocarcinoma of the left lung s/p resection and chemotherapy (incomplete due to intolerance) who presents with follow-up.   08/21/20 Since our last visit, he reports he is tolerating Stiolto. Rarely uses albuterol He is working on weight gain. Previously did water aerobics pre-pandemic four years ago. Still working on low impact strengthening exercise. Otherwise states he is not that active. Denies cough and wheezing. Has shortness of breath that seems to occur with carrying groceries and upstairs. He still smokes 2 ppd. He states he is interested in Pulmonary Rehab but has not been contacted.  05/06/21 He is compliant with his Stiolto daily. He does not know when to use his albuterol. He reports he has shortness of breath, cough and wheezing with short distances. Continues to be an active smoker.   05/30/21 Since our last visit, he was scheduled for navigational bronchoscopy however CT scan prior to procedure demonstrated interval development of left pleural effusion. I contacted him to schedule thoracentesis for therapeutic and diagnostic testing. He has had worsening shortness of breath over these last few weeks. No wheezing or cough.  Social History: Currently smoker 1ppd. Previously 2ppd.  Past Medical History:  Diagnosis Date   BPH (benign prostatic hypertrophy)    weak stream   Chronic kidney disease    Complication of anesthesia    urinary retention   COPD (chronic obstructive pulmonary disease) (HCC)    Diabetes mellitus without complication (HCC)    Type II   History of bladder stone     History of colon polyps    08/2013   pre-canerous   History of kidney stones    History of melanoma excision    LEFT EAR   Hyperlipidemia    Hypertension    Iron deficiency    Lung cancer, lingula ([place]) 11/01/2019   Open wound    left lower jaw; followed by ENT   Peripheral vascular disease ([place])    Right ureteral stone    SBO (small bowel obstruction) ([place]) 12/26/2019   Shingles 05-15-2015      Outpatient Medications Prior to Visit  Medication Sig Dispense Refill   acetaminophen (TYLENOL) 500 MG tablet Take 2 tablets (1,000 mg total) by mouth every 6 (six) hours as needed for mild pain. 30 tablet 0   albuterol (VENTOLIN HFA) 108 (90 Base) MCG/ACT inhaler Inhale 2 puffs into the lungs every 6 (six) hours as needed for wheezing or shortness of breath. 8 g 3   amLODipine (NORVASC) 10 MG tablet Take 10 mg by mouth daily.     aspirin EC 81 MG tablet Take 81 mg by mouth daily.     atorvastatin (LIPITOR) 40 MG tablet Take 40 mg by mouth daily.     Cholecalciferol (VITAMIN D-3) 1000 units CAPS Take 1,000 Units by mouth daily.      ezetimibe (ZETIA) 10 MG tablet Take 5 mg by mouth daily.     finasteride (PROSCAR) 5 MG tablet TAKE 1 TABLET BY MOUTH  DAILY 90 tablet 3   fluticasone (FLONASE) 50 MCG/ACT nasal spray Place 2 sprays into both nostrils daily as needed for allergies or rhinitis.  folic acid (FOLVITE) 568 MCG tablet Take 400 mcg by mouth daily.     gabapentin (NEURONTIN) 100 MG capsule Take 100-200 mg by mouth See admin instructions. Take 200 in the morning, 100 mg in the afternoon and bedtime     metFORMIN (GLUCOPHAGE-XR) 500 MG 24 hr tablet Take 1,000 mg by mouth 2 (two) times daily. Morning and evening     Multiple Vitamins-Minerals (MULTIVITAMIN WITH MINERALS) tablet Take 1 tablet by mouth daily.     olmesartan (BENICAR) 40 MG tablet Take 40 mg by mouth daily.     potassium citrate (UROCIT-K) 10 MEQ (1080 MG) SR tablet Take 10 mEq by mouth 3 (three) times daily with meals.      Tiotropium Bromide-Olodaterol (STIOLTO RESPIMAT) 2.5-2.5 MCG/ACT AERS Inhale 2 puffs into the lungs daily. 12 g 3   traMADol (ULTRAM) 50 MG tablet Take 1 tablet (50 mg total) by mouth every 6 (six) hours as needed (mild pain). 20 tablet 0   No facility-administered medications prior to visit.    Review of Systems  Constitutional:  Negative for chills, diaphoresis, fever, malaise/fatigue and weight loss.  HENT:  Negative for congestion.   Respiratory:  Positive for shortness of breath. Negative for cough, hemoptysis, sputum production and wheezing.   Cardiovascular:  Negative for chest pain, palpitations and leg swelling.    Objective:   Vitals:   05/30/21 1056  BP: 130/72  Pulse: (!) 102  Temp: 97.9 F (36.6 C)  TempSrc: Oral  SpO2: 91%  Weight: 178 lb 12.8 oz (81.1 kg)  Height: 5\' 10"  (1.778 m)   SpO2: 91 % O2 Device: None (Room air)  Physical Exam: General: Chronically ill-appearing, no acute distress HENT: [place], AT Eyes: EOMI, no scleral icterus Respiratory: Decreased left lung air entry. Normal right breath sounds  No crackles, wheezing or rales Cardiovascular: RRR, -M/R/G, no JVD Extremities:-Edema,-tenderness Neuro: AAO x4, CNII-XII grossly intact Psych: Normal mood, normal affect  Data Reviewed:  Imaging: CT Chest Lung Screen 08/25/16 - RUL apical lung nodule measuring 1.66mm. RLL 62mm calcified granuloma. Moderate emphysema. CT Chest Lung Screen 08/24/17 - Right apical lung nodule measuring 2.49mm. RLL 1.75mm calcified granuloma. Left apical pleural-parenchymal scarring. Moderate emphysema. CT Chest 04/27/20 - S/p left upper lobe wedge resection with surgical staple line. No interval pulmonary nodules development. Emphysema.  CT Chest 11/05/20 - Nodular area adjacent to suture line of LUL resection measuring 2.0x2.4, previously 1.8 x 2.4 cm. New subpleural LUL nodule in anterior aspect measuring 70mm. Subpleural paraverterbral LLL nodule 10 x 85mm. CT Chest 02/05/21 -  Nodular area adjacent to suture line stable 1.9 x 2.0. Subpleural LUL with interval growth to 28mm. Subpleural paravertebral LLL nodule with interval grown 1.6 x 1.3 cm PET/CT 02/19/21 - S/p LUL wedge resection. Hypermetabolic nodular soft tissue in LUL and 42mm LUL and paraverterbral LLL lesion measuring 1.6 x 1.3 cm CT Chest 05/22/21 - Interval development of moderate left pleural effusion CXR 05/30/21 - S/p thoracentesis. No pneumothorax. Partially loculated left pleural fluid, anterior>posterior  PFT: 09/02/17 Spirometry FVC 1.6 (36%) FEV1 1.0 (31%) Ratio Chad  Interpretation: Very severe obstructive defect  08/08/19 FVC 3.65 (87%) FEV1 1.83 (59%) Ratio 53  TLC 78% DLCO 64% Interpretation: Moderately severe mixed obstructive and restrictive defect with mildly reduced DLCO. Significant bronchodilator response in FEV1 and FVC.  Procedure:   Thoracentesis  Procedure Note  Date:05/30/21 Time:11:30 AM  Provider Performing:Jadine Brumley Rodman Pickle   Procedure: Thoracentesis with imaging guidance (12751)  Indication(s) Pleural Effusion  Consent Risks  of the procedure as well as the alternatives and risks of each were explained to the patient and/or caregiver.  Consent for the procedure was obtained and is signed in the bedside chart  Anesthesia Topical only with 1% lidocaine    Time Out Verified patient identification, verified procedure, site/side was marked, verified correct patient position, special equipment/implants available, medications/allergies/relevant history reviewed, required imaging and test results available.   Sterile Technique Maximal sterile technique including full sterile barrier drape, hand hygiene, sterile gown, sterile gloves, mask, hair covering, sterile ultrasound probe cover (if used).  Procedure Description Ultrasound was used to identify appropriate pleural anatomy for placement and overlying skin marked.  Area of drainage cleaned and draped in sterile fashion.  Lidocaine was used to anesthetize the skin and subcutaneous tissue.  1100 cc's of maroon colored fluid was drained from the left pleural space. Catheter then removed and bandaid applied to site.     Complications/Tolerance None; patient tolerated the procedure well. Chest X-ray is ordered to confirm no post-procedural complication.   EBL Minimal   Specimen(s) Pleural fluid    Assessment & Plan:   Discussion: 75 year old Gomez active smoker with COPD and hx stage IIIa adenocarcinoma of lung s/p LUL resection in 2021. He completed two cycles before this was discontinued due to intolerance (enteritis, renal failure causing frequent admissions) and is currently under observation with oncology. Chest imaging in August 2022 demonstrated interval growth of anterior subpleural LUL nodule from 60mm to 59mm and paraverterbral LLL nodule from 1x1cm to 1.6 x 1.3 cm compared to three months ago. Follow-up PET imaging with hypermetabolic activity in these lesions as well as nodular area adjacent to prior LUL resection.  Stage IIIa adenocarcinoma of the left lung Suspected malignant left pleural effusion --S/p thoracentesis 05/30/21 with 1.1L maroon-colored fluid removed. No immediate post-procedure complications --F/u pleural studies: cell count, culture, cytology, LDH and protein --With residual pleural volume, patient would benefit from PleurX catheter placement. Will coordinate with Pulmonary consult team for next week  Very severe COPD   --CONTINUE Stioloto TWO puffs ONCE a day  Tobacco abuse --Active smoker  Health Maintenance Immunization History  Administered Date(s) Administered   DTaP 12/04/2015   Pfizer Covid-19 Vaccine Bivalent Booster 72yrs & up 08/21/2015, 05/13/2021   Pneumococcal Conjugate-13 05/04/2014, 09/02/2016   Pneumococcal Polysaccharide-23 06/19/2015, 05/28/2020   Tdap 12/04/2015   CT Lung Screen - not qualified  Orders Placed This Encounter  Procedures   DG Chest  2 View    Standing Status:   Future    Number of Occurrences:   1    Standing Expiration Date:   05/30/2022    Order Specific Question:   Reason for Exam (SYMPTOM  OR DIAGNOSIS REQUIRED)    Answer:   thoracentesis    Order Specific Question:   Preferred imaging location?    Answer:   Internal   Body fluid cell count with differential    Standing Status:   Future    Number of Occurrences:   1    Standing Expiration Date:   05/30/2022   Protein, total    Standing Status:   Future    Number of Occurrences:   1    Standing Expiration Date:   05/30/2022   Lactate dehydrogenase    Standing Status:   Future    Number of Occurrences:   1    Standing Expiration Date:   05/30/2022   Glucose, random    Standing Status:   Future  Number of Occurrences:   1    Standing Expiration Date:   05/30/2022   Lactate dehydrogenase, fluid - peritoneal or pleural    Standing Status:   Future    Number of Occurrences:   1    Standing Expiration Date:   05/30/2022   Lactate dehydrogenase    Standing Status:   Future    Standing Expiration Date:   05/30/2022   No orders of the defined types were placed in this encounter.   No follow-ups on file. After PleurX placement  I have spent a total time of 36-minutes on the day of the appointment reviewing prior documentation, coordinating care and discussing medical diagnosis and plan with the patient/family. Past medical history, allergies, medications were reviewed. Pertinent imaging, labs and tests included in this note have been reviewed and interpreted independently by me.  [place], MD [facility] 05/30/2021 3:09 PM  Office Number [phone]

## 2021-05-31 ENCOUNTER — Telehealth: Payer: Self-pay | Admitting: Pulmonary Disease

## 2021-05-31 ENCOUNTER — Ambulatory Visit: Payer: Medicare Other | Admitting: Pulmonary Disease

## 2021-05-31 LAB — CYTOLOGY - NON PAP

## 2021-05-31 LAB — TRIGLYCERIDES, BODY FLUIDS: Triglycerides, Fluid: 30 mg/dL

## 2021-06-02 LAB — CHOLESTEROL, BODY FLUID: Cholesterol, Fluid: 54 mg/dL

## 2021-06-03 ENCOUNTER — Telehealth: Payer: Self-pay

## 2021-06-03 LAB — BODY FLUID CULTURE W GRAM STAIN: Culture: NO GROWTH

## 2021-06-03 NOTE — Telephone Encounter (Signed)
-----   Message from [place], [place] sent at 06/03/2021  9:30 AM EST ----- Regarding: FW: PleurX Placement  ----- Message ----- From: Freddi Starr, MD Sent: 05/30/2021   5:00 PM EST To: [facility] Procedure Subject: PleurX Placement                               Please schedule the following:  Provider performing procedure: Freda Jackson Diagnosis: Pleural Effusion Which side? Left Procedure: PleurX Placement Has patient been spoken to by Provider and given informed consent? Yes Anesthesia: No Do you need Fluro? No Duration of procedure: 1 hour Date: 12/13 Alternate Date: 12/14 Time: any time in afternoon Location: [facility] Does patient have OSA? No DM? Yes Or Latex allergy? No Medication Restriction: No Anticoagulate/Antiplatelet: No Pre-op Labs Ordered:determined by Anesthesia Imaging request: None (If, SuperDimension CT Chest, please have STAT courier sent to ENDO)  Please coordinate Pre-op COVID Testing     Thanks, JD

## 2021-06-03 NOTE — Telephone Encounter (Signed)
Called and spoke with patient. Let them know their Chad Gomez is scheduled for 06/05/2021 with Dr. Erin Fulling at 2:00 pm.  Patient was instructed to arrive at hospital at 1:30. Patient instructed not to have anything to eat or drink.   Patient voiced understanding, nothing further needed  Routing to Dr. Erin Fulling as Juluis Rainier

## 2021-06-04 NOTE — Telephone Encounter (Signed)
[facility] Telephone Encounter  I reviewed patient's labs from his recent thoracentesis on 05/30/21. Cytology +adenocarcinoma. Will message his oncologist. He is scheduled for PleurX placement on 06/05/21. Patient expressed understanding and appreciation for his medical care.  Rodman Pickle, M.D. [facility] Pulmonary/Critical Care Medicine 06/04/2021 12:05 PM

## 2021-06-05 ENCOUNTER — Ambulatory Visit (HOSPITAL_COMMUNITY): Admission: RE | Admit: 2021-06-05 | Payer: Medicare Other | Source: Home / Self Care | Admitting: Pulmonary Disease

## 2021-06-05 ENCOUNTER — Ambulatory Visit: Payer: Medicare Other | Admitting: Pulmonary Disease

## 2021-06-05 ENCOUNTER — Encounter (HOSPITAL_COMMUNITY): Admission: RE | Payer: Self-pay | Source: Home / Self Care

## 2021-06-05 ENCOUNTER — Telehealth: Payer: Self-pay | Admitting: Pulmonary Disease

## 2021-06-05 SURGERY — INSERTION, PLEURAL DRAINAGE CATHETER
Anesthesia: LOCAL

## 2021-06-05 NOTE — Telephone Encounter (Signed)
I called and spoke with the pt's spouse  He is having diarrhea and needs to cancel pleurx insertion planned for today at 2 pm with Dr Erin Fulling  He would like to reschedule to a later date I called Endo and cancelled and made Dr Erin Fulling aware  Dr Loanne Drilling- when would you like to try and reschedule this?

## 2021-06-05 NOTE — Telephone Encounter (Signed)
Please reattempt to schedule for December 20 - 27 with Dr. Verlee Monte.

## 2021-06-05 NOTE — Telephone Encounter (Signed)
Sounds good to me

## 2021-06-05 NOTE — Telephone Encounter (Addendum)
I called and spoke with the pt and he could do 06/12/21. Called Endo and spoke with Larene Beach. Pt scheduled for 2 pm at [facility] on 06/12/21. Sending to Dr Verlee Monte and Dr Loanne Drilling to let them know.

## 2021-06-07 DIAGNOSIS — R338 Other retention of urine: Secondary | ICD-10-CM | POA: Diagnosis not present

## 2021-06-08 ENCOUNTER — Emergency Department (HOSPITAL_COMMUNITY)
Admission: EM | Admit: 2021-06-08 | Discharge: 2021-06-09 | Disposition: A | Discharge status: Home / Self Care | Payer: Medicare Other | Attending: Emergency Medicine | Admitting: Emergency Medicine

## 2021-06-08 ENCOUNTER — Encounter (HOSPITAL_COMMUNITY): Payer: Self-pay

## 2021-06-08 ENCOUNTER — Other Ambulatory Visit: Payer: Self-pay

## 2021-06-08 DIAGNOSIS — I129 Hypertensive chronic kidney disease with stage 1 through stage 4 chronic kidney disease, or unspecified chronic kidney disease: Secondary | ICD-10-CM | POA: Insufficient documentation

## 2021-06-08 DIAGNOSIS — Z85118 Personal history of other malignant neoplasm of bronchus and lung: Secondary | ICD-10-CM | POA: Insufficient documentation

## 2021-06-08 DIAGNOSIS — T83098A Other mechanical complication of other indwelling urethral catheter, initial encounter: Secondary | ICD-10-CM | POA: Insufficient documentation

## 2021-06-08 DIAGNOSIS — Z7984 Long term (current) use of oral hypoglycemic drugs: Secondary | ICD-10-CM | POA: Diagnosis not present

## 2021-06-08 DIAGNOSIS — R3912 Poor urinary stream: Secondary | ICD-10-CM | POA: Insufficient documentation

## 2021-06-08 DIAGNOSIS — Z79899 Other long term (current) drug therapy: Secondary | ICD-10-CM | POA: Diagnosis not present

## 2021-06-08 DIAGNOSIS — T839XXA Unspecified complication of genitourinary prosthetic device, implant and graft, initial encounter: Secondary | ICD-10-CM

## 2021-06-08 DIAGNOSIS — N189 Chronic kidney disease, unspecified: Secondary | ICD-10-CM | POA: Insufficient documentation

## 2021-06-08 DIAGNOSIS — J449 Chronic obstructive pulmonary disease, unspecified: Secondary | ICD-10-CM | POA: Diagnosis not present

## 2021-06-08 DIAGNOSIS — Z7982 Long term (current) use of aspirin: Secondary | ICD-10-CM | POA: Diagnosis not present

## 2021-06-08 DIAGNOSIS — E119 Type 2 diabetes mellitus without complications: Secondary | ICD-10-CM | POA: Insufficient documentation

## 2021-06-08 DIAGNOSIS — Z7951 Long term (current) use of inhaled steroids: Secondary | ICD-10-CM | POA: Insufficient documentation

## 2021-06-08 DIAGNOSIS — F17210 Nicotine dependence, cigarettes, uncomplicated: Secondary | ICD-10-CM | POA: Insufficient documentation

## 2021-06-08 NOTE — ED Triage Notes (Signed)
Patient arrives from home with report of urinary retention . Pt had foley catheter placed yesterday at 2 pm and states it has been draining very little urine since. Patient reports pelvic pain 5/10

## 2021-06-08 NOTE — ED Provider Notes (Signed)
Emergency Medicine Provider Triage Evaluation Note  Chad Gomez , a 75 y.o. male  was evaluated in triage.  Pt complains of urinary retention.  Patient has a chronic Foley catheter which he had replaced yesterday at 2 PM at Dr. Jethro Poling office.  Patient states he has had minimal output in the foley bag. No fever or chills. Patient is concerned because tubing from foley is now further out than it was when placed.   Review of Systems  Positive: Difficulties urinating Negative: fever  Physical Exam  BP (!) 162/82 (BP Location: Right Arm)    Pulse (!) 113    Temp 97.8 F (36.6 C) (Oral)    Resp 20    SpO2 95%  Gen:   Awake, no distress   Resp:  Normal effort  MSK:   Moves extremities without difficulty  Other:  Large ventral hernia  Medical Decision Making  Medically screening exam initiated at 11:38 PM.  Appropriate orders placed.  JERREMY MAIONE was informed that the remainder of the evaluation will be completed by another provider, this initial triage assessment does not replace that evaluation, and the importance of remaining in the ED until their evaluation is complete.  Labs UA Lactic acid due to tachycardia   Karie Kirks 06/08/21 2340    Veryl Speak, MD 06/09/21 [phone]

## 2021-06-09 LAB — CBC WITH DIFFERENTIAL/PLATELET
Abs Immature Granulocytes: 0.04 10*3/uL (ref 0.00–0.07)
Basophils Absolute: 0.1 10*3/uL (ref 0.0–0.1)
Basophils Relative: 1 %
Eosinophils Absolute: 0.4 10*3/uL (ref 0.0–0.5)
Eosinophils Relative: 4 %
HCT: 46.1 % (ref 39.0–52.0)
Hemoglobin: 15.2 g/dL (ref 13.0–17.0)
Immature Granulocytes: 0 %
Lymphocytes Relative: 13 %
Lymphs Abs: 1.5 10*3/uL (ref 0.7–4.0)
MCH: 28.6 pg (ref 26.0–34.0)
MCHC: 33 g/dL (ref 30.0–36.0)
MCV: 86.8 fL (ref 80.0–100.0)
Monocytes Absolute: 1.2 10*3/uL — ABNORMAL HIGH (ref 0.1–1.0)
Monocytes Relative: 10 %
Neutro Abs: 8.5 10*3/uL — ABNORMAL HIGH (ref 1.7–7.7)
Neutrophils Relative %: 72 %
Platelets: 365 10*3/uL (ref 150–400)
RBC: 5.31 MIL/uL (ref 4.22–5.81)
RDW: 13.3 % (ref 11.5–15.5)
WBC: 11.8 10*3/uL — ABNORMAL HIGH (ref 4.0–10.5)
nRBC: 0 % (ref 0.0–0.2)

## 2021-06-09 LAB — COMPREHENSIVE METABOLIC PANEL
ALT: 13 U/L (ref 0–44)
AST: 17 U/L (ref 15–41)
Albumin: 3.9 g/dL (ref 3.5–5.0)
Alkaline Phosphatase: 105 U/L (ref 38–126)
Anion gap: 10 (ref 5–15)
BUN: 16 mg/dL (ref 8–23)
CO2: 22 mmol/L (ref 22–32)
Calcium: 10 mg/dL (ref 8.9–10.3)
Chloride: 103 mmol/L (ref 98–111)
Creatinine, Ser: 1.02 mg/dL (ref 0.61–1.24)
GFR, Estimated: >60 mL/min (ref >60)
Glucose, Bld: 114 mg/dL — ABNORMAL HIGH (ref 70–99)
Potassium: 3.8 mmol/L (ref 3.5–5.1)
Sodium: 135 mmol/L (ref 135–145)
Total Bilirubin: 0.8 mg/dL (ref 0.3–1.2)
Total Protein: 7.3 g/dL (ref 6.5–8.1)

## 2021-06-09 LAB — URINALYSIS, ROUTINE W REFLEX MICROSCOPIC
Bilirubin Urine: NEGATIVE
Glucose, UA: NEGATIVE mg/dL
Hgb urine dipstick: NEGATIVE
Ketones, ur: NEGATIVE mg/dL
Nitrite: POSITIVE — AB
Protein, ur: NEGATIVE mg/dL
Specific Gravity, Urine: 1.01 (ref 1.005–1.030)
pH: 6 (ref 5.0–8.0)

## 2021-06-09 LAB — LACTIC ACID, PLASMA: Lactic Acid, Venous: 1 mmol/L (ref 0.5–1.9)

## 2021-06-09 MED ORDER — CEPHALEXIN 500 MG PO CAPS: 500.0000 mg | ORAL_CAPSULE | Freq: Three times a day (TID) | ORAL | 0 refills | Status: DC

## 2021-06-09 MED ORDER — LIDOCAINE HCL URETHRAL/MUCOSAL 2 % EX GEL
1.0000 "application " | Freq: Once | CUTANEOUS | Status: AC
  Administered 2021-06-09: 1 via URETHRAL
  Filled 2021-06-09: qty 11

## 2021-06-09 NOTE — ED Provider Notes (Signed)
[place] DEPT Provider Note   CSN: [id number] Arrival date & time: 06/08/21  2259     History Chief Complaint  Patient presents with   Foley Catheter Problem     Chad Gomez is a 75 y.o. male.  The history is provided by the patient and medical records.   75 y.o. M with hx of BPH, CKD, bladder stones, lung CA, presenting to the ED for trouble with foley catheter.  Has chronic foley, replaced yesterday in urology office with Dr. Jeffie Pollock.  States throughout the day today has had decreasing urinary output, some drainage from around catheter itself but none into collection bag.  Denies fever, chills, sweats.  Patient admits tubing seems "further out of place" from when it was initially put in.  No hx of similar issues in the past.  Past Medical History:  Diagnosis Date   BPH (benign prostatic hypertrophy)    weak stream   Chronic kidney disease    Complication of anesthesia    urinary retention   COPD (chronic obstructive pulmonary disease) (HCC)    Diabetes mellitus without complication (HCC)    Type II   History of bladder stone    History of colon polyps    08/2013   pre-canerous   History of kidney stones    History of melanoma excision    LEFT EAR   Hyperlipidemia    Hypertension    Iron deficiency    Lung cancer, lingula ([place]) 11/01/2019   Open wound    left lower jaw; followed by ENT   Peripheral vascular disease ([place])    Right ureteral stone    SBO (small bowel obstruction) ([place]) 12/26/2019   Shingles 05-15-2015    Patient Active Problem List   Diagnosis Date Noted   Malignant pleural effusion 05/30/2021   Lung nodule 05/08/2021   Protein-calorie malnutrition, severe 12/28/2019   Leukopenia due to antineoplastic chemotherapy ([place]) 12/26/2019   Sepsis ([place]) 12/26/2019   Ventral hernia 12/26/2019   Chronic indwelling Foley catheter 12/26/2019   Encounter for antineoplastic immunotherapy 12/19/2019   Adenocarcinoma of left  lung, stage 3 ([place]) 12/16/2019   Encounter for antineoplastic chemotherapy 11/30/2019   Goals of care, counseling/discussion 11/09/2019   Lung cancer, lingula ([place]) 11/01/2019   S/P partial lobectomy of lung 10/28/2019   Dyslipidemia 10/06/2019   Type 2 diabetes mellitus with complication, without long-term current use of insulin ([place]) 10/06/2019   Tobacco abuse 09/06/2019   Hypertension 09/06/2019   Nephrolithiasis 09/06/2019   Melanoma ([place]) 09/06/2019   Centrilobular emphysema ([place]) 05/23/2019   Stage 4 very severe COPD by GOLD classification ([place]) 05/23/2019   Atherosclerosis of native artery of both lower extremities with intermittent claudication ([place]) 05/26/2016   S/P aortobifemoral bypass surgery 05/26/2016   Ureteral stone with hydronephrosis 03/21/2014   Ulnar neuropathy 02/07/2014   Compulsive tobacco user syndrome 02/07/2014   Allergic rhinitis 02/07/2014    Past Surgical History:  Procedure Laterality Date   ABDOMINAL AORTAGRAM  11/01/2014   Procedure: Abdominal Aortagram;  Surgeon: Serafina Mitchell, MD;  Location: [place] [facility];  Service: Cardiovascular;;   AORTA - BILATERAL FEMORAL ARTERY BYPASS GRAFT N/A 12/28/2014   Procedure: AORTOBIFEMORAL BYPASS GRAFT;  Surgeon: Serafina Mitchell, MD;  Location: [place];  Service: Vascular;  Laterality: N/A;   BRONCHIAL BRUSHINGS  09/06/2019   Procedure: BRONCHIAL BRUSHINGS;  Surgeon: Collene Gobble, MD;  Location[facility] ENDOSCOPY;  Service: Pulmonary;;  left upper lobe nodule   BRONCHIAL  WASHINGS  09/06/2019   Procedure: BRONCHIAL WASHINGS;  Surgeon: Collene Gobble, MD;  Location: [facility] ENDOSCOPY;  Service: Pulmonary;;   COLONOSCOPY W/ POLYPECTOMY  09-13-2013   CYSTOLITHALOPAXY OF BLADDER STONE/ TRANSRECTAL ULTRASOUND PROSTATE BX  08-19-2005   CYSTOSCOPY W/ RETROGRADES Right 12/02/2012   Procedure: CYSTOSCOPY WITH RETROGRADE PYELOGRAM;  Surgeon: Malka So, MD;  Location: [facility];  Service: Urology;  Laterality:  Right;   CYSTOSCOPY W/ RETROGRADES Bilateral 10/13/2013   Procedure: CYSTOSCOPY WITH RETROGRADE PYELOGRAM;  Surgeon: Irine Seal, MD;  Location: [facility];  Service: Urology;  Laterality: Bilateral;   CYSTOSCOPY W/ URETERAL STENT PLACEMENT Left 04/11/2014   Procedure: CYSTOSCOPY WITH STENT REPLACEMENT;  Surgeon: Malka So, MD;  Location: [facility];  Service: Urology;  Laterality: Left;   CYSTOSCOPY WITH LITHOLAPAXY Right 12/02/2012   Procedure: CYSTOSCOPY WITH stone extraction;  Surgeon: Malka So, MD;  Location: [facility];  Service: Urology;  Laterality: Right;   CYSTOSCOPY WITH LITHOLAPAXY Right 10/13/2013   Procedure: CYSTOSCOPY WITH LITHOLAPAXY, removal of urethral stone with basket;  Surgeon: Irine Seal, MD;  Location: [facility];  Service: Urology;  Laterality: Right;   CYSTOSCOPY WITH RETROGRADE PYELOGRAM, URETEROSCOPY AND STENT PLACEMENT Left 03/21/2014   Procedure: CYSTOSCOPY WITH RETROGRADE PYELOGRAM, AND LEFT STENT PLACEMENT;  Surgeon: Alexis Frock, MD;  Location: [facility];  Service: Urology;  Laterality: Left;   CYSTOSCOPY WITH RETROGRADE PYELOGRAM, URETEROSCOPY AND STENT PLACEMENT  09/09/2016   Procedure: CYSTOSCOPY WITH RIGHT  RETROGRADE PYELOGRAM, URETEROSCOPY WITH LASER BASKET EXTRACTION OF STONES  AND STENT PLACEMENT;  Surgeon: Irine Seal, MD;  Location: [facility];  Service: Urology;;   CYSTOSCOPY WITH URETEROSCOPY Left 04/11/2014   Procedure: CYSTOSCOPY WITH URETEROSCOPY/ STONE EXTRACTION;  Surgeon: Malka So, MD;  Location: [facility];  Service: Urology;  Laterality: Left;   FINE NEEDLE ASPIRATION  09/06/2019   Procedure: FINE NEEDLE ASPIRATION;  Surgeon: Collene Gobble, MD;  Location: [facility] ENDOSCOPY;  Service: Pulmonary;;  left upper lobe - lung   HOLMIUM LASER APPLICATION Right 2/83/1517   Procedure: HOLMIUM LASER APPLICATION;  Surgeon: Malka So, MD;  Location: [facility];  Service: Urology;  Laterality: Right;   HOLMIUM LASER APPLICATION Left 61/60/7371   Procedure: HOLMIUM LASER APPLICATION;  Surgeon: Malka So, MD;  Location: [facility];  Service: Urology;  Laterality: Left;   HOLMIUM LASER APPLICATION Right 0/62/6948   Procedure: HOLMIUM LASER APPLICATION;  Surgeon: Irine Seal, MD;  Location: [facility];  Service: Urology;  Laterality: Right;   INGUINAL HERNIA REPAIR Right 1954   INTERCOSTAL NERVE BLOCK Left 10/28/2019   Procedure: Intercostal Nerve Block;  Surgeon: [name], MD;  Location: [place];  Service: Thoracic;  Laterality: Left;   LITHOTRIPSY  2007 & 02/2013   LUNG BIOPSY  09/06/2019   Procedure: LUNG BIOPSY;  Surgeon: Collene Gobble, MD;  Location: [facility] ENDOSCOPY;  Service: Pulmonary;;  left upper lob lung   MANDIBLE FRACTURE SURGERY  1995   MICROLARYNGOSCOPY Right 02/18/2016   Procedure: MICRO DIRECT LARYNGOSCOPY EXCISIONAL BIOPSY OF RIGHT VOCAL CORD MASS;  Surgeon: Leta Baptist, MD;  Location: [place];  Service: ENT;  Laterality: Right;   [place]   left ear   MULTIPLE TOOTH EXTRACTIONS  1994   NEPHROLITHOTOMY Left 01/27/2013   Procedure: NEPHROLITHOTOMY PERCUTANEOUS FIRST LOOK;  Surgeon: Malka So, MD;  [facility];  Service: Urology;  Laterality: Left;   NODE DISSECTION  10/28/2019   Procedure: Node Dissection;  Surgeon: [name], MD;  Location: [place];  Service: Thoracic;;   PERIPHERAL VASCULAR CATHETERIZATION N/A 11/01/2014   Procedure: Lower Extremity Angiography;  Surgeon: Serafina Mitchell, MD;  Location: [place] [facility];  Service: Cardiovascular;  Laterality: N/A;   URETEROSCOPY Right 12/02/2012   Procedure: RIGHT URETEROSCOPY STONE EXTRACTION WITH  STENT PLACEMENT;  Surgeon: Malka So, MD;  Location: [facility];  Service: Urology;  Laterality: Right;   VIDEO BRONCHOSCOPY  10/28/2019   VIDEO BRONCHOSCOPY WITH ENDOBRONCHIAL  NAVIGATION WITH LUNG MARKINGS (N/A   VIDEO BRONCHOSCOPY WITH ENDOBRONCHIAL NAVIGATION N/A 09/06/2019   Procedure: VIDEO BRONCHOSCOPY WITH ENDOBRONCHIAL NAVIGATION;  Surgeon: Collene Gobble, MD;  Location: [place] ENDOSCOPY;  Service: Pulmonary;  Laterality: N/A;   VIDEO BRONCHOSCOPY WITH ENDOBRONCHIAL NAVIGATION N/A 10/28/2019   Procedure: VIDEO BRONCHOSCOPY WITH ENDOBRONCHIAL NAVIGATION WITH LUNG MARKINGS;  Surgeon: [name], MD;  Location[facility];  Service: Thoracic;  Laterality: N/A;   WEDGE RESECTION  10/28/2019   LEFT UPPER LOBE       Family History  Problem Relation Age of Onset   Dementia Mother    Hypertension Mother    Stroke Mother 23   Dementia Father    Hypertension Sister    Colon cancer Neg Hx     Social History   Tobacco Use   Smoking status: Every Day    Packs/day: 2.00    Years: 60.00    Pack years: 120.00    Types: Cigarettes    Start date: 06/24/1959   Smokeless tobacco: Never   Tobacco comments:    a little over a pack per day  Vaping Use   Vaping Use: Former  Substance Use Topics   Alcohol use: No    Alcohol/week: 0.0 standard drinks   Drug use: No    Home Medications Prior to Admission medications   Medication Sig Start Date End Date Taking? Authorizing Provider  acetaminophen (TYLENOL) 500 MG tablet Take 2 tablets (1,000 mg total) by mouth every 6 (six) hours as needed for mild pain. 11/05/19   Barrett, Erin R, PA-C  albuterol (VENTOLIN HFA) 108 (90 Base) MCG/ACT inhaler Inhale 2 puffs into the lungs every 6 (six) hours as needed for wheezing or shortness of breath. 05/06/21   Margaretha Seeds, MD  amLODipine (NORVASC) 10 MG tablet Take 10 mg by mouth daily. 11/15/20   [provider]  aspirin EC 81 MG tablet Take 81 mg by mouth daily.    [provider]  atorvastatin (LIPITOR) 40 MG tablet Take 40 mg by mouth daily. 07/15/19   [provider]  Cholecalciferol (VITAMIN D-3) 1000 units CAPS Take 1,000 Units by mouth  daily.     [provider]  ezetimibe (ZETIA) 10 MG tablet Take 5 mg by mouth daily.    [provider]  finasteride (PROSCAR) 5 MG tablet TAKE 1 TABLET BY MOUTH  DAILY 04/10/21   Irine Seal, MD  fluticasone [facility]) 50 MCG/ACT nasal spray Place 2 sprays into both nostrils daily as needed for allergies or rhinitis. 08/09/19   [provider]  folic acid (FOLVITE) 604 MCG tablet Take 400 mcg by mouth daily.    [provider]  gabapentin (NEURONTIN) 100 MG capsule Take 100-200 mg by mouth See admin instructions. Take 200 in the morning, 100 mg in the afternoon and bedtime 03/26/20  [provider]  metFORMIN (GLUCOPHAGE-XR) 500 MG 24 hr tablet Take 1,000 mg by mouth 2 (two) times daily. Morning and evening 04/25/19   [provider]  Multiple Vitamins-Minerals (MULTIVITAMIN WITH MINERALS) tablet Take 1 tablet by mouth daily.    [provider]  olmesartan (BENICAR) 40 MG tablet Take 40 mg by mouth daily. 12/27/20   [provider]  potassium citrate (UROCIT-K) 10 MEQ (1080 MG) SR tablet Take 10 mEq by mouth 3 (three) times daily with meals.    [provider]  Tiotropium Bromide-Olodaterol (STIOLTO RESPIMAT) 2.5-2.5 MCG/ACT AERS Inhale 2 puffs into the lungs daily. 05/14/21   Margaretha Seeds, MD  traMADol (ULTRAM) 50 MG tablet Take 1 tablet (50 mg total) by mouth every 6 (six) hours as needed (mild pain). 01/20/20   Swayze, Ava, DO    Allergies    Gabapentin, Morphine and related, Tape, Allevyn adhesive [wound dressings], Amoxicillin, and Naproxen  Review of Systems   Review of Systems  Genitourinary:        Foley problem  All other systems reviewed and are negative.  Physical Exam Updated Vital Signs BP 135/87    Pulse (!) 105    Temp 97.6 F (36.4 C) (Oral)    Resp 16    SpO2 96%   Physical Exam Vitals and nursing note reviewed.  Constitutional:      Appearance: He is well-developed.  HENT:     Head:  Normocephalic and atraumatic.  Eyes:     Conjunctiva/sclera: Conjunctivae normal.     Pupils: Pupils are equal, round, and reactive to light.  Cardiovascular:     Rate and Rhythm: Normal rate and regular rhythm.     Heart sounds: Normal heart sounds.  Pulmonary:     Effort: Pulmonary effort is normal. No respiratory distress.     Breath sounds: Normal breath sounds. No rhonchi.  Abdominal:     General: Bowel sounds are normal.     Palpations: Abdomen is soft.  Musculoskeletal:        General: Normal range of motion.     Cervical back: Normal range of motion.  Skin:    General: Skin is warm and dry.  Neurological:     Mental Status: He is alert and oriented to person, place, and time.    ED Results / Procedures / Treatments   Labs (all labs ordered are listed, but only abnormal results are displayed) Labs Reviewed  CBC WITH DIFFERENTIAL/PLATELET - Abnormal; Notable for the following components:      Result Value   WBC 11.8 (*)    Neutro Abs 8.5 (*)    Monocytes Absolute 1.2 (*)    All other components within normal limits  COMPREHENSIVE METABOLIC PANEL - Abnormal; Notable for the following components:   Glucose, Bld 114 (*)    All other components within normal limits  URINALYSIS, ROUTINE W REFLEX MICROSCOPIC - Abnormal; Notable for the following components:   Nitrite POSITIVE (*)    Leukocytes,Ua TRACE (*)    Bacteria, UA MANY (*)    All other components within normal limits  URINE CULTURE  LACTIC ACID, PLASMA    EKG None  Radiology No results found.  Procedures Procedures   Medications Ordered in ED Medications - No data to display  ED Course  I have reviewed the triage vital signs and the nursing notes.  Pertinent labs & imaging results that were available during my care of the patient were reviewed by me and  considered in my medical decision making (see chart for details).    MDM Rules/Calculators/A&P                         75 y.o. M here with foley  issues.  Has chornic foley, changed yesterday in urology office but now not draining.  While in the ED catheter completely dislodged.  New caudae catheter placed here, draining as normal.  Patient has relief of bladder pressure.  Labs reassuring.  UA is nitrite + with many bacteria-- patient and wife requested to leave prior to this resulting as daughter needed vehicle for work this AM, they were notified of positive results and keflex sent to pharmacy pending urine culture.  They will follow-up with urology.  Return here for new concerns.  Final Clinical Impression(s) / ED Diagnoses Final diagnoses:  Foley catheter problem, initial encounter [facility])    Rx / DC Orders ED Discharge Orders          Ordered    cephALEXin (KEFLEX) 500 MG capsule  3 times daily        06/09/21 0453             Larene Pickett, PA-C 06/09/21 7371    Veryl Speak, MD 06/09/21 [phone]

## 2021-06-09 NOTE — Discharge Instructions (Signed)
Take the prescribed medication as directed.  You will be notified if urine culture requires different antibiotics. Follow-up with urology. Return to the ED for new or worsening symptoms.

## 2021-06-09 NOTE — ED Notes (Signed)
Leg bag attached per patient request

## 2021-06-09 NOTE — ED Notes (Signed)
Discussed checking his mychart to see if prescriptions were sent as patient had to leave before urine resulted because his daughter had to be at work and was his and his wife's only ride. He verbalized understanding.

## 2021-06-10 ENCOUNTER — Inpatient Hospital Stay: Payer: Medicare Other

## 2021-06-10 ENCOUNTER — Inpatient Hospital Stay: Payer: Medicare Other | Admitting: Internal Medicine

## 2021-06-11 LAB — URINE CULTURE: Culture: >=100000 — AB

## 2021-06-12 ENCOUNTER — Telehealth: Payer: Self-pay | Admitting: Emergency Medicine

## 2021-06-12 ENCOUNTER — Telehealth: Payer: Self-pay | Admitting: Pulmonary Disease

## 2021-06-12 ENCOUNTER — Ambulatory Visit (HOSPITAL_COMMUNITY): Admission: RE | Admit: 2021-06-12 | Payer: Medicare Other | Source: Ambulatory Visit | Admitting: Student

## 2021-06-12 ENCOUNTER — Encounter (HOSPITAL_COMMUNITY): Admission: RE | Payer: Self-pay | Source: Ambulatory Visit

## 2021-06-12 LAB — MISC LABCORP TEST (SEND OUT): Labcorp test code: 9985

## 2021-06-12 SURGERY — INSERTION, PLEURAL DRAINAGE CATHETER
Anesthesia: LOCAL

## 2021-06-12 NOTE — Telephone Encounter (Signed)
Post ED Visit - Positive Culture Follow-up  Culture report reviewed by antimicrobial stewardship pharmacist: [place] Team []  Elenor Quinones, Pharm.D. []  Heide Guile, Pharm.D., BCPS AQ-ID []  Parks Neptune, Pharm.D., BCPS []  Alycia Rossetti, Pharm.D., BCPS []  [place], [place].D., BCPS, AAHIVP []  Legrand Como, Pharm.D., BCPS, AAHIVP []  Salome Arnt, PharmD, BCPS []  Johnnette Gourd, PharmD, BCPS []  Hughes Better, PharmD, BCPS []  Leeroy Cha, PharmD []  Laqueta Linden, PharmD, BCPS []  Albertina Parr, PharmD  [place] Team []  Leodis Sias, PharmD []  Lindell Spar, PharmD []  Royetta Asal, PharmD []  Graylin Shiver, Rph []  Rema Fendt) Glennon Mac, PharmD []  Arlyn Dunning, PharmD []  Netta Cedars, PharmD []  Dia Sitter, PharmD []  Leone Haven, PharmD []  Gretta Arab, PharmD []  Theodis Shove, PharmD []  Peggyann Juba, PharmD []  Reuel Boom, PharmD Jimmy Footman PharmD   Positive urine culture Treated with cephalexin, organism sensitive to the same and no further patient follow-up is required at this time.  Hazle Nordmann 06/12/2021, 10:06 AM

## 2021-06-12 NOTE — Telephone Encounter (Signed)
Called and spoke with pt letting him know that the pleurx has been rescheduled for 12/29 at 2pm at [facility]. Pt verbalized understanding.  Routing this to procedure pool as an Pharmacist, hospital.

## 2021-06-12 NOTE — Telephone Encounter (Signed)
Called and spoke with the pt. His daughter's car broke down so he has to cancel his pleurx with Dr. Verlee Monte at 2 pm today. He would like to know if this could be done on 06/20/21 at 2 pm. I already made Dr Verlee Monte and endo aware of his cancellation for today.

## 2021-06-12 NOTE — Telephone Encounter (Signed)
Endoscopy at [place] has scheduled patient for 06/20/21 at [facility]. Procedure will be performed by me (Dr. Loanne Drilling). Please let patient know that if he develops worsening symptoms (including but not limited to shortness of breath, chest pain, fever, chills), he needs to present to the ED as he has known significant pleural effusion that is the likely source.

## 2021-06-19 ENCOUNTER — Other Ambulatory Visit: Payer: Self-pay | Admitting: *Deleted

## 2021-06-19 NOTE — Patient Instructions (Signed)
Visit Information  Thank you for taking time to visit with me today. Please don't hesitate to contact me if I can be of assistance to you before our next scheduled telephone appointment.  Following are the goals we discussed today:  Patient Goals/Self-Care Activities: Take all medications as prescribed Attend all scheduled provider appointments Call provider office for new concerns or questions  identify and remove indoor air pollutants limit outdoor activity during cold weather listen for public air quality announcements every day follow rescue plan if symptoms flare-up Continue to contact pulmonology and oncology as needed to discuss future treatments and for questions and concerns Drink Glucerna as a nutritional supplement when you have a decrease appetite; I have included coupons  The patient verbalized understanding of instructions, educational materials, and care plan provided today and agreed to receive a mailed copy of patient instructions, educational materials, and care plan.   Telephone follow up appointment with care management team member scheduled for:06/25/21  [name] RN, [place] [phone] [email or web address]

## 2021-06-19 NOTE — Patient Outreach (Signed)
Chad Gomez) Chad Gomez) Care Management Chad Gomez   06/20/2021 Name:  Chad Gomez MRN:  [id number] DOB:  11/03/1945  Summary: Nurse called patient back in response to a message she received from [facility] CM. Patient's wife answered the phone and stated that the patient is not available to speak this morning. Wife explained that this nurse could call back after 1100 to speak with patient.   Nurse called patient back at 1137. Patient explained that he called nurse get assistance with going onto web site that remotes his vital signs to a nurse. Through conversation it was discovered that patient was referring to the [facility] remote program. Nurse was able to assist with giving the patient a contact number to call. Patient shared that he was scheduled to have a PleurX catheter placed on 06/20/21. He reported that he does have transportation and feels supported at this time. Nurse will call patient on 06/25/21 to ensure he is doing well after his procedure. Patient did not have any further questions or concerns today and did confirm that he has this nurse's contact number to call her if needed.   Recommendations/Changes made from today's visit: Continue to contact pulmonology and oncology as needed to discuss future treatments and for questions and concerns Drink Glucerna as a nutritional supplement when you have a decrease appetite; Glucerna coupons were mailed to patient  Subjective: Chad Gomez is an 75 y.o. year old male who is a primary patient of [name], MD. The care management team was consulted for assistance with care management and/or care coordination needs.    RN Health Coach completed Telephone Visit today.   Objective:  Medications Reviewed Today     Reviewed by Michiel Cowboy, RN (Registered Nurse) on 06/19/21 at 1355  Med List Status: <None>   Medication Order Taking? Sig Documenting Provider Last Dose Status  Informant  acetaminophen (TYLENOL) 500 MG tablet [id number] Yes Take 2 tablets (1,000 mg total) by mouth every 6 (six) hours as needed for mild pain. Barrett, Lodema Hong, PA-C Taking Active Self  albuterol (VENTOLIN HFA) 108 (90 Base) MCG/ACT inhaler [id number] Yes Inhale 2 puffs into the lungs every 6 (six) hours as needed for wheezing or shortness of breath. Margaretha Seeds, MD Taking Active Self  amLODipine (NORVASC) 10 MG tablet [id number] Yes Take 10 mg by mouth daily. [provider] Taking Active Self  aspirin EC 81 MG tablet [id number] Yes Take 81 mg by mouth daily. [provider] Taking Active Self  atorvastatin (LIPITOR) 40 MG tablet [id number] Yes Take 40 mg by mouth daily. [provider] Taking Active Self  cephALEXin (KEFLEX) 500 MG capsule [id number] No Take 1 capsule (500 mg total) by mouth 3 (three) times daily.  Patient not taking: Reported on 06/19/2021   Larene Pickett, PA-C Not Taking Active Self           Med Gomez Laretta Alstrom, Gio Janoski A   Wed Jun 19, 2021  1:54 PM) completed  Cholecalciferol (VITAMIN D-3) 1000 units CAPS [id number] Yes Take 1,000 Units by mouth daily.  [provider] Taking Active Self  ezetimibe (ZETIA) 10 MG tablet [id number] Yes Take 5 mg by mouth daily. [provider] Taking Active Self           Med Gomez Nadara Eaton May 22, 2021 11:46 AM)    finasteride (PROSCAR) 5 MG tablet [id number] Yes TAKE 1 TABLET BY  MOUTH  DAILY Irine Seal, MD Taking Active Self  gabapentin (NEURONTIN) 100 MG capsule [id number] Yes Take 100-200 mg by mouth See admin instructions. Take 200 in the morning, 100 mg in the afternoon and bedtime [provider] Taking Active Self  metFORMIN (GLUCOPHAGE-XR) 500 MG 24 hr tablet [id number] Yes Take 1,000 mg by mouth 2 (two) times daily. Morning and evening [provider] Taking Active Self  Multiple Vitamins-Minerals (MULTIVITAMIN WITH MINERALS) tablet [id number] Yes Take 1 tablet  by mouth daily. [provider] Taking Active Self  olmesartan (BENICAR) 40 MG tablet [id number] Yes Take 40 mg by mouth daily. [provider] Taking Active Self  oxybutynin (DITROPAN) 5 MG tablet [id number] Yes Take 5 mg by mouth every morning. [provider] Taking Active   potassium citrate (UROCIT-K) 10 MEQ (1080 MG) SR tablet [id number] Yes Take 10 mEq by mouth 3 (three) times daily with meals. [provider] Taking Active Self  Tiotropium Bromide-Olodaterol (STIOLTO RESPIMAT) 2.5-2.5 MCG/ACT AERS [id number] Yes Inhale 2 puffs into the lungs daily. Margaretha Seeds, MD Taking Active Self  traMADol Veatrice Bourbon) 50 MG tablet [id number] Yes Take 1 tablet (50 mg total) by mouth every 6 (six) hours as needed (mild pain). Karie Kirks, DO Taking Active Self           Med Gomez Nadara Eaton May 22, 2021 11:50 AM)               SDOH:  (Social Determinants of Health) assessments and interventions performed: SDOH assessments completed today and documented in the Epic system.    Care Plan  Review of patient past medical history, allergies, medications, health status, including review of consultants reports, laboratory and other test data, was performed as part of comprehensive evaluation for care management services.   Care Plan : RN Care Manager Plan of care  Updates made by Michiel Cowboy, RN since 06/20/2021 12:00 AM     Problem: Knowledge Deficit Related to COPD   Priority: High     Long-Range Goal: Development plan of care for management of COPD   Start Date: 04/25/2021  Expected End Date: 05/22/2022  Priority: High  Gomez:   Current Barriers:  Chronic Disease Management support and education needs related to COPD and Lung Nodule   RNCM Clinical Goal(s):  Patient will verbalize understanding of plan for management of COPD and Lung Nodule as evidenced by using rescue medication as needed; learning how to maintain and use PleurX to drain  chronic pleural effusion; continue to contact providers as needed to assist with management of COPD and Lung nodule; discuss with oncology and pulmonology lung nodule treatment options continue to work with RN Care Manager to address care management and care coordination needs related to  COPD and Lung nodule as evidenced by adherence to CM Team Scheduled appointments collaborate with the care management team towards completion of advanced directives documents as evidenced by patient's verbalizations he has completed the documents.  Interventions: Inter-disciplinary care team collaboration (see longitudinal plan of care) Evaluation of current treatment plan related to  self management and patient's adherence to plan as established by provider Nurse will follow up with patient 06/25/21 to ensure he is doing well after PleurX catheter placement   COPD Interventions:  (Status:  Goal on track:  Yes.) Long Term Goal Provided patient with basic written and verbal COPD education on self care/management/and exacerbation prevention Advised patient to track and manage COPD triggers Provided instruction about proper use  of medications used for management of COPD including inhalers Advised patient to self assesses COPD action plan zone and make appointment with provider if in the yellow zone for 48 hours without improvement Provided education about and advised patient to utilize infection prevention strategies to reduce risk of respiratory infection Discussed the importance of adequate rest and management of fatigue with COPD  Patient Goals/Self-Care Activities: Take all medications as prescribed Attend all scheduled provider appointments Call provider office for new concerns or questions  identify and remove indoor air pollutants limit outdoor activity during cold weather listen for public air quality announcements every day follow rescue plan if symptoms flare-up Continue to contact pulmonology and  oncology as needed to discuss future treatments and for questions and concerns Drink Glucerna as a nutritional supplement when you have a decrease appetite; I have included coupons  Follow Up Plan:  Telephone follow up appointment with care management team member scheduled for:  06/25/21     Care Plan : RN Care Manager Plan of Care  Updates made by Michiel Cowboy, RN since 06/20/2021 12:00 AM     Problem: Management of COPD and Lung Nodule Resolved 06/20/2021  Priority: High     Long-Range Goal: Development of Plan of Care for Management of COPD and Lung Nodule Completed 06/20/2021  Priority: High  Gomez:   Put in a new care plan in error       Plan: Telephone follow up appointment with care management team member scheduled for:  06/25/21  [name] RN, [place] [phone] [email or web address]

## 2021-06-20 ENCOUNTER — Ambulatory Visit (HOSPITAL_COMMUNITY)
Admission: RE | Admit: 2021-06-20 | Discharge: 2021-06-20 | Disposition: A | Discharge status: Home / Self Care | Payer: Medicare Other | Source: Ambulatory Visit | Attending: Pulmonary Disease | Admitting: Pulmonary Disease

## 2021-06-20 ENCOUNTER — Ambulatory Visit (HOSPITAL_COMMUNITY): Payer: Medicare Other

## 2021-06-20 ENCOUNTER — Telehealth: Payer: Self-pay

## 2021-06-20 ENCOUNTER — Encounter (HOSPITAL_COMMUNITY)
Admission: RE | Disposition: A | Discharge status: Home / Self Care | Payer: Self-pay | Source: Ambulatory Visit | Attending: Pulmonary Disease

## 2021-06-20 DIAGNOSIS — C3410 Malignant neoplasm of upper lobe, unspecified bronchus or lung: Secondary | ICD-10-CM | POA: Diagnosis not present

## 2021-06-20 DIAGNOSIS — J90 Pleural effusion, not elsewhere classified: Secondary | ICD-10-CM | POA: Diagnosis not present

## 2021-06-20 DIAGNOSIS — F17210 Nicotine dependence, cigarettes, uncomplicated: Secondary | ICD-10-CM | POA: Diagnosis not present

## 2021-06-20 DIAGNOSIS — J910 Malignant pleural effusion: Secondary | ICD-10-CM | POA: Diagnosis not present

## 2021-06-20 DIAGNOSIS — J439 Emphysema, unspecified: Secondary | ICD-10-CM | POA: Diagnosis not present

## 2021-06-20 DIAGNOSIS — R0600 Dyspnea, unspecified: Secondary | ICD-10-CM | POA: Diagnosis not present

## 2021-06-20 DIAGNOSIS — C3492 Malignant neoplasm of unspecified part of left bronchus or lung: Secondary | ICD-10-CM | POA: Insufficient documentation

## 2021-06-20 HISTORY — PX: CHEST TUBE INSERTION: SHX231

## 2021-06-20 SURGERY — INSERTION, PLEURAL DRAINAGE CATHETER

## 2021-06-20 NOTE — Telephone Encounter (Signed)
-----   Message from [place], MD sent at 06/20/2021  4:33 PM EST ----- Regarding: Schedule for follow-up with NP in 10 days Please schedule with NP in 10-14 days (January 9th) for PleurX suture removal  Please also schedule patient follow-up with me in 6 weeks for follow-up  Thank you, JE

## 2021-06-20 NOTE — Telephone Encounter (Signed)
Called and spoke with patient to get him scheduled for follow up for suture removal.  Next Appt With Pulmonology Clayton Bibles, NP) 07/04/2021 at 12:00 PM  Patient has also been scheduled for follow up with Dr. Loanne Drilling on 2

## 2021-06-20 NOTE — Discharge Instructions (Signed)
PleurX Home Management If draining >356ml, drain daily If draining 200-300 ml, drain every other day If draining 100-200 ml, drain every three days When you drain 50 ml three consecutive days in a row, notify your doctor so we can check a chest x-ray and possibly remove the catheter. Please keep a log and bring it with you to your clinic appointment.  We will call to arrange follow-up in 10 days with NP in Pulmonary clinic for suture removal

## 2021-06-20 NOTE — Interval H&P Note (Signed)
History and Physical Interval Note:  06/20/2021 2:47 PM  JIM LUNDIN  has presented today for surgery, with the diagnosis of Lung Ca.  The various methods of treatment have been discussed with the patient and family. After consideration of risks, benefits and other options for treatment, the patient has consented to  Procedure(s): INSERTION PLEURAL DRAINAGE CATHETER (N/A) as a surgical intervention.  The patient's history has been reviewed, patient examined, no change in status, stable for surgery.  I have reviewed the patient's chart and labs.  Questions were answered to the patient's satisfaction.     Alin Hutchins Rodman Pickle

## 2021-06-21 ENCOUNTER — Telehealth: Payer: Self-pay | Admitting: Pulmonary Disease

## 2021-06-21 ENCOUNTER — Encounter (HOSPITAL_COMMUNITY): Payer: Self-pay | Admitting: Pulmonary Disease

## 2021-06-21 NOTE — Op Note (Signed)
PleurX Insertion Procedure Note  Chad Gomez [id number] Aug 15, 2045  Date:05/2921  Time:2:45 PM  Provider Performing:Tatum Massman Rodman Pickle, MD  Procedure: PleurX Tunneled Pleural Catheter Placement (26834)  Indication(s) Relief of dyspnea from recurrent effusion  Consent Risks of the procedure as well as the alternatives and risks of each were explained to the patient and/or caregiver.  Consent for the procedure was obtained.   Anesthesia Topical only with 1% lidocaine    Time Out Verified patient identification, verified procedure, site/side was marked, verified correct patient position, special equipment/implants available, medications/allergies/relevant history reviewed, required imaging and test results available.   Sterile Technique Maximal sterile technique including sterile barrier drape, hand hygiene, sterile gown, sterile gloves, mask, hair covering.    Procedure Description Ultrasound used to identify appropriate pleural anatomy for placement and overlying skin marked.  Area of drainage cleaned and draped in sterile fashion.   Lidocaine was used to anesthetize the skin and subcutaneous tissue.   1.5 cm incision made overlying fluid and another about 5 cm anterior to this along chest wall.  PleurX catheter inserted in usual sterile fashion using modified seldinger technique.  Interrupted silk sutures placed at catheter insertion and tunneling points which will be removed at later date.  PleurX catheter then hooked to suction with 350 cc bloody appearing fluid removed.  After fluid aspirated, pleurX capped and sterile dressing applied.   Complications/Tolerance None; patient tolerated the procedure well. Chest X-ray is ordered to confirm no post-procedural complication.   EBL Minimal   Specimen(s) none

## 2021-06-21 NOTE — Telephone Encounter (Signed)
Called patient, he estimates ~80cc with coughing. He is running out of gauze and tegederm due to dressing being soaked.  I have advised him to drain PleurX today. For dressing and tegederms, we discussed obtaining supplies from local pharmacy including sterile gauze and safe alternatives to keep PleurX exit site covered and clean.  I will contact patient on next business day (January 2nd) to check in and if needed, assist with supplies until he can get his PleurX bottles delivered.

## 2021-06-21 NOTE — Telephone Encounter (Signed)
Called and spoke with pt who states that his pleurx cath is leaking but he is not sure where exactly it is leaking at.  Pt said the first time he noticed the leaking, it was below the dressing but then the second time he noticed the leaking, it was at the top of the dressing.  Pt said the only time he notices that the catheter is leaking is when he has to cough hard.  Pt said that he has already used 4 dressings today due to this issue.  Dr. Loanne Drilling, please advise what you recommend.

## 2021-06-25 ENCOUNTER — Other Ambulatory Visit: Payer: Self-pay | Admitting: *Deleted

## 2021-06-25 ENCOUNTER — Ambulatory Visit: Payer: Self-pay | Admitting: *Deleted

## 2021-06-25 NOTE — Patient Outreach (Signed)
[facility]) Care Management  06/25/2021  Chad Gomez 1946/05/19 [id number]  Unsuccessful outreach attempt made to patient. Nurse called patient post PleurX placement procedure to ensure patient is doing well. RN Health Coach left HIPAA compliant voicemail message along with her contact information and requested that the patient call this nurse back for questions or concerns.  Plan: RN Health Coach will call patient within the next several business days.   [name] RN, BSN [place] [phone] [email or web address]

## 2021-06-27 ENCOUNTER — Other Ambulatory Visit: Payer: Self-pay | Admitting: *Deleted

## 2021-06-27 NOTE — Patient Outreach (Signed)
[place] [place] [facility]) Care Management  06/27/2021  Chad Gomez 06/05/1946 [id number]   Successful telephone outreach call to patient. HIPAA identifiers obtained. Nurse called patient to ensure he was doing well after his PleurX Catheter placement and to respond back to the patient's VM he left earlier. Patient explained that he was still unable to use the [facility] web site to transmit his vital signs to a nurse. This nurse was able to get in touch with an [facility] representative who did call the patient back to address this issue. Patient reports that his wife is able to use the PleurX catheter to drain the patient's effusion without difficulty. He states the incision site is sore but does not have any signs of infection. Patient explains that the site did ooze some fluid the day after the procedure when he coughed but that has since stopped and he currently does not have any concerns about the PleurX catheter. He does have a follow up appointment with pulmonology on 07/04/21. Patient did not have any further questions or concerns today and did confirm that he has this nurse's contact number to call her if needed.   Plan: RN Health Coach will call patient within the month of March  [name] RN, [place] [phone] [email or web address]

## 2021-06-28 ENCOUNTER — Telehealth: Payer: Self-pay | Admitting: Pulmonary Disease

## 2021-06-28 ENCOUNTER — Ambulatory Visit: Payer: Medicare Other | Admitting: Pulmonary Disease

## 2021-06-28 NOTE — Telephone Encounter (Signed)
[facility] Telephone Encounter  I contacted patient regarding PleurX catheter status. On last telephone visit he had some leakage with coughing. No longer having any significant issues. Since placement, wife has drained PleurX twice with last drainage ~75cc. He is breathing better. No additional supplies needed at this time.

## 2021-07-01 DIAGNOSIS — R338 Other retention of urine: Secondary | ICD-10-CM | POA: Diagnosis not present

## 2021-07-04 ENCOUNTER — Other Ambulatory Visit: Payer: Self-pay

## 2021-07-04 ENCOUNTER — Ambulatory Visit: Payer: Medicare Other | Admitting: Nurse Practitioner

## 2021-07-04 ENCOUNTER — Encounter: Payer: Self-pay | Admitting: Nurse Practitioner

## 2021-07-04 ENCOUNTER — Ambulatory Visit (INDEPENDENT_AMBULATORY_CARE_PROVIDER_SITE_OTHER): Payer: Medicare Other

## 2021-07-04 VITALS — BP 128/70 | HR 105 | Temp 98.2°F | Ht 70.0 in | Wt 169.2 lb

## 2021-07-04 DIAGNOSIS — J449 Chronic obstructive pulmonary disease, unspecified: Secondary | ICD-10-CM | POA: Diagnosis not present

## 2021-07-04 DIAGNOSIS — J910 Malignant pleural effusion: Secondary | ICD-10-CM

## 2021-07-04 DIAGNOSIS — C3410 Malignant neoplasm of upper lobe, unspecified bronchus or lung: Secondary | ICD-10-CM

## 2021-07-04 DIAGNOSIS — J90 Pleural effusion, not elsewhere classified: Secondary | ICD-10-CM

## 2021-07-04 DIAGNOSIS — L03313 Cellulitis of chest wall: Secondary | ICD-10-CM

## 2021-07-04 DIAGNOSIS — J439 Emphysema, unspecified: Secondary | ICD-10-CM | POA: Diagnosis not present

## 2021-07-04 MED ORDER — MUPIROCIN 2 % EX OINT: 1.0000 "application " | TOPICAL_OINTMENT | Freq: Every day | CUTANEOUS | 0 refills | Status: DC

## 2021-07-04 MED ORDER — DOXYCYCLINE HYCLATE 100 MG PO TABS: 100.0000 mg | ORAL_TABLET | Freq: Two times a day (BID) | ORAL | 0 refills | Status: DC

## 2021-07-04 NOTE — Assessment & Plan Note (Signed)
Stable on Stiolto inhaler. Improvement in breathing since thoracentesis and PleurX placement. Continue on Stiolto 2 puffs daily and PRN albuterol. Smoking cessation strongly advised - declined at this time.

## 2021-07-04 NOTE — Assessment & Plan Note (Signed)
Under surveillance by oncology. S/p lobectomy and chemo; was unable to complete chemo d/t intolerance (enteritis and renal failure).

## 2021-07-04 NOTE — Patient Instructions (Addendum)
-  Continue Stiolto 2 puffs daily -Continue Albuterol inhaler 2 puffs every 6 hours as needed for shortness of breath or wheezing. Notify if symptoms persist despite rescue inhaler/neb use. -Continue draining PleurX as previously discussed. Clean the site with every dressing change and apply bactroban ointment  -Doxycycline 100 mg Twice daily for 10 days. Notify immediately of any rash, itching, hives, or swelling, or seek emergency care.Finish your antibiotics in their entirety. Do not stop just because symptoms improve. Medication increases photosensitivity. Avoid direct sun exposure and wear sunscreen while on this.   Chest x ray today.  Follow up with oncology as scheduled.  Follow up with Dr. Loanne Drilling or Roxan Diesel, NP in 2 weeks. If symptoms do not improve or worsen, please contact office for sooner follow up or seek emergency care.

## 2021-07-04 NOTE — Assessment & Plan Note (Addendum)
PleurX placed 12/29. Last drained 6 days ago at home. Two sutures removed from PleurX insertion site and skin without difficulties or bleeding. Concern for cellulitis. Non-toxic appearing. Doxycycline 100 mg Twice daily for 10 days. Bactroban ointment with dressing changes. Discussed importance of proper cleaning and handling. Drained in office with 100 cc blood-tinged pleural fluid; consistent with appearance of fluid previously collected per pt and wife; sterile procedure. Site cleaned with sterile dressing applied. CXR today with slight interval increase in left pleural effusion; pleural drain in place. Provided with contact for PleurX financial assistance. Close follow up. Extensive time spent with pt (in clinic for 90 minutes) for procedure, suture removal, education, and reviewing care of PleurX.   Patient Instructions  -Continue Stiolto 2 puffs daily -Continue Albuterol inhaler 2 puffs every 6 hours as needed for shortness of breath or wheezing. Notify if symptoms persist despite rescue inhaler/neb use. -Continue draining PleurX as previously discussed. Clean the site with every dressing change and apply bactroban ointment  -Doxycycline 100 mg Twice daily for 10 days. Notify immediately of any rash, itching, hives, or swelling, or seek emergency care.Finish your antibiotics in their entirety. Do not stop just because symptoms improve. Medication increases photosensitivity. Avoid direct sun exposure and wear sunscreen while on this.   Chest x ray today.  Follow up with oncology as scheduled.  Follow up with Dr. Loanne Drilling or Roxan Diesel, NP in 2 weeks. If symptoms do not improve or worsen, please contact office for sooner follow up or seek emergency care.

## 2021-07-04 NOTE — Progress Notes (Addendum)
@Patient  ID: Chad Gomez, male    DOB: 30-Jan-1946, 76 y.o.   MRN: [id number]  Chief Complaint  Patient presents with   Follow-up    Suture removal from pleurx placement     Referring provider: [name], MD  HPI: 76 year old male, current everyday smoker (2 ppd x 60 years) followed for COPD Gold 4 with centrilobular emphysema, adenocarcinoma of left lung Stage IIIA s/p lobectomy and chemo (incomplete due to intolerance), and malignant pleural effusion.  He was unable to tolerate chemotherapy due to enteritis and renal failure causing frequent admissions and is currently under observation with oncology.  Chest imaging in August 2022 demonstrated interval growth of anterior subpleural LUL nodule from 3 mm to 7 mm and paravertebral LLL nodule from 1 x 1 cm to 1.6 x 1.3 cm compared to 3 months prior.  Follow-up PET imaging with hypermetabolic activity in these lesions as well as nodular area adjacent to prior LUL resection. He is a patient of Dr. Cordelia Pen and was last seen in the hospital for Pleurx catheter insertion on 06/20/2021. Past medical history significant for atherosclerosis, HTN, allergic rhinitis, DM II, HLD, protein calorie malnutrition, chronic indwelling foley, s/p aortobifem bypass, leukopenia.   TEST/EVENTS:  Imaging: CT Chest Lung Screen 08/25/16 - RUL apical lung nodule measuring 1.61mm. RLL 94mm calcified granuloma. Moderate emphysema. CT Chest Lung Screen 08/24/17 - Right apical lung nodule measuring 2.74mm. RLL 1.67mm calcified granuloma. Left apical pleural-parenchymal scarring. Moderate emphysema. CT Chest 04/27/20 - S/p left upper lobe wedge resection with surgical staple line. No interval pulmonary nodules development. Emphysema.  CT Chest 11/05/20 - Nodular area adjacent to suture line of LUL resection measuring 2.0x2.4, previously 1.8 x 2.4 cm. New subpleural LUL nodule in anterior aspect measuring 73mm. Subpleural paraverterbral LLL nodule 10 x 4mm. CT Chest 02/05/21 -  Nodular area adjacent to suture line stable 1.9 x 2.0. Subpleural LUL with interval growth to 10mm. Subpleural paravertebral LLL nodule with interval grown 1.6 x 1.3 cm PET/CT 02/19/21 - S/p LUL wedge resection. Hypermetabolic nodular soft tissue in LUL and 46mm LUL and paraverterbral LLL lesion measuring 1.6 x 1.3 cm CT Chest 05/22/21 - Interval development of moderate left pleural effusion CXR 05/30/21 - S/p thoracentesis. No pneumothorax. Partially loculated left pleural fluid, anterior>posterior   PFT: 09/02/17 Spirometry FVC 1.6 (36%) FEV1 1.0 (31%) Ratio 63  Interpretation: Very severe obstructive defect  08/08/19 FVC 3.65 (87%) FEV1 1.83 (59%) Ratio 53  TLC 78% DLCO 64% Interpretation: Moderately severe mixed obstructive and restrictive defect with mildly reduced DLCO. Significant bronchodilator response in FEV1 and FVC.    08/21/2020: OV with Dr. Loanne Drilling.  Tolerating Stiolto with rare use of albuterol.  Working on weight gain and low impact strengthening exercises.  Still smoking 2 packs/day.  Interested in pulmonary rehab but had not been contacted.  05/06/2021: OV with Dr. Loanne Drilling.  Continued on Stiolto.  Continues to have shortness of breath, cough and wheezing with short distances.  Unsure when to use albuterol inhaler.  Continues to be an active smoker.  05/30/2021: OV with Dr. Loanne Drilling.  Previously scheduled for navigational bronchoscopy however CT scan prior to procedure demonstrated interval development of left pleural effusion.  Smoking 1 pack/day.  Thoracentesis at this visit - 1100 cc's of maroon-colored fluid drained from left pleural space.  Discussed placement of PleurX given residual pleural volume.  Continued Stiolto.  07/04/2021: Today - PleurX follow up Patient presents today with wife for suture removal after PleurX placement on  12/29. He last drained it 6 days ago and has been getting around 40 cc of blood tinged, yellow fluid out with every drainage. He does report that the  site is sore and it hurts to lay on. His cough has improved and is only occasional with minimal clear sputum production. He denies fevers, chills, N/V, shortness of breath, wheezing, orthopnea, PND. He continues on his Stiolto inhaler daily without difficulties and has not required his rescue inhaler recently. He is concerned about getting more supplies once he runs out due to the financial cost. He continues to smoke 2 ppd and does not have plans to quit. Overall, he feels his breathing is stable and offers no further complaints.   Allergies  Allergen Reactions   Gabapentin Other (See Comments)    "Slurred speech in high doses"   Morphine And Related Anxiety and Other (See Comments)    Combative, incoherent     Tape Other (See Comments) and Rash    Surgical tape Adhesive tape causes blisters   Allevyn Adhesive [Wound Dressings] Other (See Comments)    blisters   Amoxicillin Other (See Comments)    Severe stomach pain   Naproxen Swelling    Eyes swell    Immunization History  Administered Date(s) Administered   DTaP 12/04/2015   Pfizer Covid-19 Vaccine Bivalent Booster 41yrs & up 08/21/2015, 05/13/2021   Pneumococcal Conjugate-13 05/04/2014, 09/02/2016   Pneumococcal Polysaccharide-23 06/19/2015, 05/28/2020   Tdap 12/04/2015    Past Medical History:  Diagnosis Date   BPH (benign prostatic hypertrophy)    weak stream   Chronic kidney disease    Complication of anesthesia    urinary retention   COPD (chronic obstructive pulmonary disease) ([place])    Diabetes mellitus without complication (HCC)    Type II   History of bladder stone    History of colon polyps    08/2013   pre-canerous   History of kidney stones    History of melanoma excision    LEFT EAR   Hyperlipidemia    Hypertension    Iron deficiency    Lung cancer, lingula ([place]) 11/01/2019   Open wound    left lower jaw; followed by ENT   Peripheral vascular disease ([place])    Right ureteral stone    SBO (small bowel  obstruction) ([place]) 12/26/2019   Shingles 05-15-2015    Tobacco History: Social History   Tobacco Use  Smoking Status Every Day   Packs/day: 2.00   Years: 60.00   Pack years: 120.00   Types: Cigarettes   Start date: 06/24/1959  Smokeless Tobacco Never  Tobacco Comments   a little over a pack per day   Ready to quit: Not Answered Counseling given: Not Answered Tobacco comments: a little over a pack per day   Outpatient Medications Prior to Visit  Medication Sig Dispense Refill   acetaminophen (TYLENOL) 500 MG tablet Take 2 tablets (1,000 mg total) by mouth every 6 (six) hours as needed for mild pain. 30 tablet 0   albuterol (VENTOLIN HFA) 108 (90 Base) MCG/ACT inhaler Inhale 2 puffs into the lungs every 6 (six) hours as needed for wheezing or shortness of breath. 8 g 3   amLODipine (NORVASC) 10 MG tablet Take 10 mg by mouth daily.     amLODipine (NORVASC) 10 MG tablet Take 1 tablet by mouth daily.     aspirin EC 81 MG tablet Take 81 mg by mouth daily.     atorvastatin (LIPITOR) 40 MG tablet  Take 40 mg by mouth daily.     Cholecalciferol (VITAMIN D-3) 1000 units CAPS Take 1,000 Units by mouth daily.      ezetimibe (ZETIA) 10 MG tablet Take 5 mg by mouth daily.     finasteride (PROSCAR) 5 MG tablet TAKE 1 TABLET BY MOUTH  DAILY 90 tablet 3   gabapentin (NEURONTIN) 100 MG capsule Take 100-200 mg by mouth See admin instructions. Take 200 in the morning, 100 mg in the afternoon and bedtime     metFORMIN (GLUCOPHAGE-XR) 500 MG 24 hr tablet Take 1,000 mg by mouth 2 (two) times daily. Morning and evening     Multiple Vitamins-Minerals (MULTIVITAMIN WITH MINERALS) tablet Take 1 tablet by mouth daily.     olmesartan (BENICAR) 20 MG tablet Take 20 mg by mouth daily.     olmesartan (BENICAR) 40 MG tablet Take 40 mg by mouth daily.     oxybutynin (DITROPAN) 5 MG tablet Take 5 mg by mouth every morning.     potassium citrate (UROCIT-K) 10 MEQ (1080 MG) SR tablet Take 10 mEq by mouth 3 (three)  times daily with meals.     Tiotropium Bromide-Olodaterol (STIOLTO RESPIMAT) 2.5-2.5 MCG/ACT AERS Inhale 2 puffs into the lungs daily. 12 g 3   traMADol (ULTRAM) 50 MG tablet Take 1 tablet (50 mg total) by mouth every 6 (six) hours as needed (mild pain). 20 tablet 0   No facility-administered medications prior to visit.     Review of Systems:   Constitutional: No weight loss or gain, night sweats, fevers, chills, fatigue, or lassitude. HEENT: No headaches, difficulty swallowing, tooth/dental problems, or sore throat. No sneezing, itching, ear ache, nasal congestion, or post nasal drip CV:  No chest pain, orthopnea, PND, swelling in lower extremities, anasarca, dizziness, palpitations, syncope Resp: +cough, productive with small clear sputum (improved). No shortness of breath with exertion or at rest. No excess mucus or change in color of mucus. No hemoptysis. No wheezing.  No chest wall deformity GI:  No heartburn, indigestion, abdominal pain, nausea, vomiting, diarrhea, change in bowel habits, loss of appetite, bloody stools.  GU: No dysuria, change in color of urine, urgency or frequency.  No flank pain, no hematuria  Skin: +PleurX site red and tender. No rash, lesions MSK:  No joint pain or swelling.  No decreased range of motion.  No back pain. Neuro: No dizziness or lightheadedness.  Psych: No depression or anxiety. Mood stable.     Physical Exam:  BP 128/70 (BP Location: Right Arm, Patient Position: Sitting, Cuff Size: Normal)    Pulse (!) 105    Temp 98.2 F (36.8 C) (Oral)    Ht 5\' 10"  (1.778 m)    Wt 169 lb 3.2 oz (76.7 kg)    SpO2 95%    BMI 24.28 kg/m   GEN: Pleasant, interactive, chronically-ill appearing; non-toxic; in no acute distress. HEENT:  Normocephalic and atraumatic. EACs patent bilaterally. TM pearly gray with present light reflex bilaterally. PERRLA. Sclera white. Nasal turbinates pink, moist and patent bilaterally. No rhinorrhea present. Oropharynx pink and  moist, without exudate or edema. No lesions, ulcerations, or postnasal drip.  NECK:  Supple w/ fair ROM. No JVD present. Normal carotid impulses w/o bruits. Thyroid symmetrical with no goiter or nodules palpated. No lymphadenopathy.   CV: RRR, no m/r/g, no peripheral edema. Pulses intact, +2 bilaterally. No cyanosis, pallor or clubbing. PULMONARY:  Unlabored, regular breathing. Diminished left lower lobe; otherwise clear bilaterally A&P w/o wheezes/rales/rhonchi. No accessory muscle use.  No dullness to percussion. GI: BS present and normoactive. Soft, non-tender to palpation. No organomegaly or masses detected. No CVA tenderness. MSK: No erythema, warmth or tenderness. Cap refil <2 sec all extrem. No deformities or joint swelling noted.  Neuro: A/Ox3. No focal deficits noted.   Skin: Warm. Left chest wall with pleural drain in place; site erythematous and warm; no exudate; tender to palpation Psych: Normal affect and behavior. Judgement and thought content appropriate.     Lab Results:  CBC    Component Value Date/Time   WBC 11.8 (H) 06/08/2021 2357   RBC 5.31 06/08/2021 2357   HGB 15.2 06/08/2021 2357   HGB 15.2 05/20/2021 0819   HCT 46.1 06/08/2021 2357   PLT 365 06/08/2021 2357   PLT 262 05/20/2021 0819   MCV 86.8 06/08/2021 2357   MCH 28.6 06/08/2021 2357   MCHC 33.0 06/08/2021 2357   RDW 13.3 06/08/2021 2357   LYMPHSABS 1.5 06/08/2021 2357   MONOABS 1.2 (H) 06/08/2021 2357   EOSABS 0.4 06/08/2021 2357   BASOSABS 0.1 06/08/2021 2357    BMET    Component Value Date/Time   NA 135 06/08/2021 2357   K 3.8 06/08/2021 2357   CL 103 06/08/2021 2357   CO2 22 06/08/2021 2357   GLUCOSE 114 (H) 06/08/2021 2357   BUN 16 06/08/2021 2357   CREATININE 1.02 06/08/2021 2357   CREATININE 0.99 05/20/2021 0819   CALCIUM 10.0 06/08/2021 2357   GFRNONAA >60 06/08/2021 2357   GFRNONAA >60 05/20/2021 0819   GFRAA >60 01/30/2020 0854    BNP No results found for:  BNP   Imaging:  07/04/2021: CXR reviewed by me. Left pleural catheter in place. Slight interval increase in left pleural effusion; otherwise unchanged from previous exam on 12/29. Patchy and nodular densities in the left mid and lower lung  DG Chest 2 View  Result Date: 07/04/2021 CLINICAL DATA:  Pleural effusion.  PleurX in place. EXAM: CHEST - 2 VIEW COMPARISON:  06/20/2021 FINDINGS: Heart size is normal. Aortic atherosclerosis as seen previously. The right chest is clear, showing upper lung predominant emphysema. Left pleural catheter is in place at the inferior pleural space. Small amount of pleural density does persist on the left. Patchy and nodular pulmonary densities in the midlung appears similar to previous studies. No pneumothorax. IMPRESSION: Left pleural catheter remains in place at the inferior pleural space. Slight increase in pleural density compared to the previous study. Persistent patchy and nodular densities in the left mid and lower lung. Electronically Signed   By: Nelson Chimes M.D.   On: 07/04/2021 13:31   DG Chest Port 1 View  Result Date: 06/20/2021 CLINICAL DATA:  PleurX catheter placement. EXAM: PORTABLE CHEST 1 VIEW COMPARISON:  May 30, 2021. FINDINGS: Stable cardiomediastinal silhouette. Right lung is clear. Interval placement of pleural drainage catheter in the left lung base. Left pleural effusion is no longer visualized. Bony thorax unremarkable. IMPRESSION: Interval placement of pleural drainage catheter into left lung base. Left pleural effusion has resolved. Electronically Signed   By: Marijo Conception M.D.   On: 06/20/2021 16:31      PFT Results Latest Ref Rng & Units 08/08/2019 12/13/2015  FVC-Pre L 2.77 2.47  FVC-Predicted Pre % 66 57  FVC-Post L 3.65 -  FVC-Predicted Post % 87 -  Pre FEV1/FVC % % 53 39  Post FEV1/FCV % % 50 -  FEV1-Pre L 1.46 0.95  FEV1-Predicted Pre % 47 29  FEV1-Post L 1.83 -  DLCO  uncorrected ml/min/mmHg 15.94 -  DLCO [facility]% % 64 -   DLVA Predicted % 63 -    No results found for: NITRICOXIDE   07/04/2021: PleurX drained - 100 cc blood tinged pleural fluid   Assessment & Plan:   Stage 4 very severe COPD by GOLD classification ([place]) Stable on Stiolto inhaler. Improvement in breathing since thoracentesis and PleurX placement. Continue on Stiolto 2 puffs daily and PRN albuterol. Smoking cessation strongly advised - declined at this time.    Lung cancer, lingula ([place]) Under surveillance by oncology. S/p lobectomy and chemo; was unable to complete chemo d/t intolerance (enteritis and renal failure).   Malignant pleural effusion PleurX placed 12/29. Last drained 6 days ago at home. Two sutures removed from PleurX insertion site and skin without difficulties or bleeding. Concern for cellulitis. Non-toxic appearing. Doxycycline 100 mg Twice daily for 10 days. Bactroban ointment with dressing changes. Discussed importance of proper cleaning and handling. Drained in office with 100 cc blood-tinged pleural fluid; consistent with appearance of fluid previously collected per pt and wife; sterile procedure. Site cleaned with sterile dressing applied. CXR today with slight interval increase in left pleural effusion; pleural drain in place. Provided with contact for PleurX financial assistance. Close follow up. Extensive time spent with pt (in clinic for 90 minutes) for procedure, suture removal, education, and reviewing care of PleurX.   Patient Instructions  -Continue Stiolto 2 puffs daily -Continue Albuterol inhaler 2 puffs every 6 hours as needed for shortness of breath or wheezing. Notify if symptoms persist despite rescue inhaler/neb use. -Continue draining PleurX as previously discussed. Clean the site with every dressing change and apply bactroban ointment  -Doxycycline 100 mg Twice daily for 10 days. Notify immediately of any rash, itching, hives, or swelling, or seek emergency care.Finish your antibiotics in their entirety.  Do not stop just because symptoms improve. Medication increases photosensitivity. Avoid direct sun exposure and wear sunscreen while on this.   Chest x ray today.  Follow up with oncology as scheduled.  Follow up with Dr. Loanne Drilling or Roxan Diesel, NP in 2 weeks. If symptoms do not improve or worsen, please contact office for sooner follow up or seek emergency care.    50 minutes of face to face time spent with patient for procedure and extensive education.  Clayton Bibles, NP 07/05/2021  Pt aware and understands NP's role.

## 2021-07-05 NOTE — Addendum Note (Signed)
Addended by: Clayton Bibles on: 07/05/2021 02:00 PM   Modules accepted: Level of Service

## 2021-07-10 DIAGNOSIS — R338 Other retention of urine: Secondary | ICD-10-CM | POA: Diagnosis not present

## 2021-07-15 DIAGNOSIS — E559 Vitamin D deficiency, unspecified: Secondary | ICD-10-CM | POA: Diagnosis not present

## 2021-07-15 DIAGNOSIS — M810 Age-related osteoporosis without current pathological fracture: Secondary | ICD-10-CM | POA: Diagnosis not present

## 2021-07-15 DIAGNOSIS — Z13820 Encounter for screening for osteoporosis: Secondary | ICD-10-CM | POA: Diagnosis not present

## 2021-07-15 DIAGNOSIS — I739 Peripheral vascular disease, unspecified: Secondary | ICD-10-CM | POA: Diagnosis not present

## 2021-07-15 DIAGNOSIS — F17200 Nicotine dependence, unspecified, uncomplicated: Secondary | ICD-10-CM | POA: Diagnosis not present

## 2021-07-15 DIAGNOSIS — N200 Calculus of kidney: Secondary | ICD-10-CM | POA: Diagnosis not present

## 2021-07-18 ENCOUNTER — Other Ambulatory Visit: Payer: Self-pay

## 2021-07-18 ENCOUNTER — Encounter: Payer: Self-pay | Admitting: Pulmonary Disease

## 2021-07-18 ENCOUNTER — Ambulatory Visit (INDEPENDENT_AMBULATORY_CARE_PROVIDER_SITE_OTHER): Payer: Medicare Other

## 2021-07-18 ENCOUNTER — Ambulatory Visit: Payer: Medicare Other | Admitting: Pulmonary Disease

## 2021-07-18 VITALS — BP 122/62 | HR 107 | Temp 97.6°F | Ht 70.0 in | Wt 165.4 lb

## 2021-07-18 DIAGNOSIS — R911 Solitary pulmonary nodule: Secondary | ICD-10-CM | POA: Diagnosis not present

## 2021-07-18 DIAGNOSIS — R918 Other nonspecific abnormal finding of lung field: Secondary | ICD-10-CM | POA: Diagnosis not present

## 2021-07-18 DIAGNOSIS — J910 Malignant pleural effusion: Secondary | ICD-10-CM | POA: Diagnosis not present

## 2021-07-18 DIAGNOSIS — Z452 Encounter for adjustment and management of vascular access device: Secondary | ICD-10-CM | POA: Diagnosis not present

## 2021-07-18 NOTE — Progress Notes (Signed)
Subjective:   PATIENT ID: Chad Gomez GENDER: male DOB: 06/08/1946, MRN: [id number]   HPI  Chief Complaint  Patient presents with   Follow-up    No changes, feeling fairly good    Reason for Visit: Follow-up   Chad Gomez is a 76 year old male active smoker with very severe COPD/emphysema, stage IIIa adenocarcinoma of the left lung s/p resection and chemotherapy (incomplete due to intolerance) who presents with follow-up.   08/21/20 Since our last visit, he reports he is tolerating Stiolto. Rarely uses albuterol He is working on weight gain. Previously did water aerobics pre-pandemic four years ago. Still working on low impact strengthening exercise. Otherwise states he is not that active. Denies cough and wheezing. Has shortness of breath that seems to occur with carrying groceries and upstairs. He still smokes 2 ppd. He states he is interested in Pulmonary Rehab but has not been contacted.  05/06/21 He is compliant with his Stiolto daily. He does not know when to use his albuterol. He reports he has shortness of breath, cough and wheezing with short distances. Continues to be an active smoker.   05/30/21 Since our last visit, he was scheduled for navigational bronchoscopy however CT scan prior to procedure demonstrated interval development of left pleural effusion. I contacted him to schedule thoracentesis for therapeutic and diagnostic testing. He has had worsening shortness of breath over these last few weeks. No wheezing or cough.  07/18/21 Wife is present for this visit and provides additional history. Since our last visit he had PleurX catheter placed on 06/20/21. Initially had issues with excess drainage but that improved. He was treated for cellulitis two weeks ago after being evaluated by NP Cobb when he had his sutures removed. Redness has improved however he reports pain when laying on it and when  frequent cough. He drains it every 3-4 days. For the last two drains <50 cc. Denies recent fevers, chills.  Social History: Currently smoker 1ppd. Previously 2ppd.  Past Medical History:  Diagnosis Date   BPH (benign prostatic hypertrophy)    weak stream   Chronic kidney disease    Complication of anesthesia    urinary retention   COPD (chronic obstructive pulmonary disease) (HCC)    Diabetes mellitus without complication (HCC)    Type II   History of bladder stone    History of colon polyps    08/2013   pre-canerous   History of kidney stones    History of melanoma excision    LEFT EAR   Hyperlipidemia    Hypertension    Iron deficiency    Lung cancer, lingula ([place]) 11/01/2019   Open wound    left lower jaw; followed by ENT   Peripheral vascular disease ([place])    Right ureteral stone    SBO (small bowel obstruction) ([place]) 12/26/2019   Shingles 05-15-2015      Outpatient Medications Prior to Visit  Medication Sig Dispense Refill   acetaminophen (TYLENOL) 500 MG tablet Take 2 tablets (1,000 mg total) by mouth every 6 (six) hours as needed for mild pain. 30 tablet 0   albuterol (VENTOLIN HFA) 108 (90 Base) MCG/ACT inhaler Inhale 2 puffs into the lungs every 6 (six) hours as needed for wheezing or shortness of breath. 8 g 3   amLODipine (NORVASC) 10 MG tablet Take 10 mg by mouth daily.     amLODipine (NORVASC) 10 MG tablet Take 1 tablet by mouth daily.     aspirin EC  81 MG tablet Take 81 mg by mouth daily.     atorvastatin (LIPITOR) 40 MG tablet Take 40 mg by mouth daily.     Cholecalciferol (VITAMIN D-3) 1000 units CAPS Take 1,000 Units by mouth daily.      ezetimibe (ZETIA) 10 MG tablet Take 5 mg by mouth daily.     finasteride (PROSCAR) 5 MG tablet TAKE 1 TABLET BY MOUTH  DAILY 90 tablet 3   gabapentin (NEURONTIN) 100 MG capsule Take 100-200 mg by mouth See admin instructions. Take 200 in the morning, 100 mg in the afternoon and bedtime     metFORMIN (GLUCOPHAGE-XR) 500 MG 24  hr tablet Take 1,000 mg by mouth 2 (two) times daily. Morning and evening     Multiple Vitamins-Minerals (MULTIVITAMIN WITH MINERALS) tablet Take 1 tablet by mouth daily.     mupirocin ointment (BACTROBAN) 2 % Apply 1 application topically daily. With dressing changes. 22 g 0   olmesartan (BENICAR) 20 MG tablet Take 20 mg by mouth daily.     olmesartan (BENICAR) 40 MG tablet Take 40 mg by mouth daily.     oxybutynin (DITROPAN) 5 MG tablet Take 5 mg by mouth every morning.     potassium citrate (UROCIT-K) 10 MEQ (1080 MG) SR tablet Take 10 mEq by mouth 3 (three) times daily with meals.     Tiotropium Bromide-Olodaterol (STIOLTO RESPIMAT) 2.5-2.5 MCG/ACT AERS Inhale 2 puffs into the lungs daily. 12 g 3   traMADol (ULTRAM) 50 MG tablet Take 1 tablet (50 mg total) by mouth every 6 (six) hours as needed (mild pain). 20 tablet 0   doxycycline (VIBRA-TABS) 100 MG tablet Take 1 tablet (100 mg total) by mouth 2 (two) times daily. (Patient not taking: Reported on 07/18/2021) 20 tablet 0   No facility-administered medications prior to visit.    Review of Systems  Constitutional:  Negative for chills, diaphoresis, fever, malaise/fatigue and weight loss.  HENT:  Negative for congestion.   Respiratory:  Positive for cough and shortness of breath. Negative for hemoptysis, sputum production and wheezing.   Cardiovascular:  Positive for chest pain. Negative for palpitations and leg swelling.    Objective:   Vitals:   07/18/21 1337  BP: 122/62  Pulse: (!) 107  Temp: 97.6 F (36.4 C)  TempSrc: Oral  SpO2: 94%  Weight: 165 lb 6.4 oz (75 kg)  Height: 5\' 10"  (1.778 m)   SpO2: 94 % O2 Device: None (Room air)  Physical Exam: General: Chronically ill-appearing, no acute distress HENT: [place], AT Eyes: EOMI, no scleral icterus Respiratory: Diminished breath sounds with bilateral air entry.  No crackles, wheezing or rales Cardiovascular: RRR, -M/R/G, no JVD Extremities:-Edema,-tenderness Neuro: AAO x4,  CNII-XII grossly intact Psych: Normal mood, normal affect  Data Reviewed:  Imaging: CT Chest Lung Screen 08/25/16 - RUL apical lung nodule measuring 1.32mm. RLL 22mm calcified granuloma. Moderate emphysema. CT Chest Lung Screen 08/24/17 - Right apical lung nodule measuring 2.25mm. RLL 1.42mm calcified granuloma. Left apical pleural-parenchymal scarring. Moderate emphysema. CT Chest 04/27/20 - S/p left upper lobe wedge resection with surgical staple line. No interval pulmonary nodules development. Emphysema.  CT Chest 11/05/20 - Nodular area adjacent to suture line of LUL resection measuring 2.0x2.4, previously 1.8 x 2.4 cm. New subpleural LUL nodule in anterior aspect measuring 51mm. Subpleural paraverterbral LLL nodule 10 x 83mm. CT Chest 02/05/21 - Nodular area adjacent to suture line stable 1.9 x 2.0. Subpleural LUL with interval growth to 69mm. Subpleural paravertebral LLL nodule with  interval grown 1.6 x 1.3 cm PET/CT 02/19/21 - S/p LUL wedge resection. Hypermetabolic nodular soft tissue in LUL and 15mm LUL and paraverterbral LLL lesion measuring 1.6 x 1.3 cm CT Chest 05/22/21 - Interval development of moderate left pleural effusion CXR 05/30/21 - S/p thoracentesis. No pneumothorax. Partially loculated left pleural fluid, anterior>posterior CXR 07/18/21 - Tunneled pleural catheter, residual left pleural fluid/thickening.   PFT: 09/02/17 Spirometry FVC 1.6 (36%) FEV1 1.0 (31%) Ratio 63  Interpretation: Very severe obstructive defect  08/08/19 FVC 3.65 (87%) FEV1 1.83 (59%) Ratio 53  TLC 78% DLCO 64% Interpretation: Moderately severe mixed obstructive and restrictive defect with mildly reduced DLCO. Significant bronchodilator response in FEV1 and FVC.  Assessment & Plan:   Discussion: 76 year old male active smoker with COPD and hx stage IIIa adenocarcinoma of lung s/p LUL resection in 2021. He completed two cycles before this was discontinued due to intolerance (enteritis, renal failure causing frequent  admissions) and is currently under observation with oncology. Chest imaging in August 2022 demonstrated interval growth of anterior subpleural LUL nodule from 81mm to 27mm and paraverterbral LLL nodule from 1x1cm to 1.6 x 1.3 cm compared to three months ago. Follow-up PET imaging with hypermetabolic activity in these lesions as well as nodular area adjacent to prior LUL resection.  Stage IVadenocarcinoma of the left lung Malignant left pleural effusion s/p PleuX catheter 06/20/22 --Decreased pleural output x 2 <50cc --Removed pleurx catheter in-office --ORDER CXR prior to next visit on 07/29/21 to evaluate for recurrent effusion  Very severe COPD   --CONTINUE Stioloto TWO puffs ONCE a day  Tobacco abuse --Active smoker  Health Maintenance Immunization History  Administered Date(s) Administered   DTaP 12/04/2015   Pfizer Covid-19 Vaccine Bivalent Booster 70yrs & up 08/21/2015, 05/13/2021   Pneumococcal Conjugate-13 05/04/2014, 09/02/2016   Pneumococcal Polysaccharide-23 06/19/2015, 05/28/2020   Tdap 12/04/2015   CT Lung Screen - not qualified  Orders Placed This Encounter  Procedures   DG Chest 2 View    Standing Status:   Future    Number of Occurrences:   1    Standing Expiration Date:   07/18/2022    Order Specific Question:   Reason for Exam (SYMPTOM  OR DIAGNOSIS REQUIRED)    Answer:   pleurx    Order Specific Question:   Preferred imaging location?    Answer:   Internal   DG Chest 2 View    Standing Status:   Future    Standing Expiration Date:   07/18/2022    Scheduling Instructions:     CXR before OV    Order Specific Question:   Reason for Exam (SYMPTOM  OR DIAGNOSIS REQUIRED)    Answer:   pleural effusion    Order Specific Question:   Preferred imaging location?    Answer:   Internal   No orders of the defined types were placed in this encounter.  Return in about 11 days (around 07/29/2021).   I have spent a total time of 32-minutes on the day of the appointment  reviewing prior documentation, coordinating care and discussing medical diagnosis and plan with the patient/family. Past medical history, allergies, medications were reviewed. Pertinent imaging, labs and tests included in this note have been reviewed and interpreted independently by me.  [place], MD [facility] 07/18/2021 1:43 PM  Office Number [phone]

## 2021-07-24 ENCOUNTER — Encounter: Payer: Self-pay | Admitting: Pulmonary Disease

## 2021-07-29 ENCOUNTER — Telehealth: Payer: Self-pay | Admitting: Internal Medicine

## 2021-07-29 ENCOUNTER — Telehealth: Payer: Self-pay

## 2021-07-29 ENCOUNTER — Ambulatory Visit: Payer: Medicare Other | Admitting: Pulmonary Disease

## 2021-07-29 ENCOUNTER — Other Ambulatory Visit: Payer: Self-pay

## 2021-07-29 ENCOUNTER — Ambulatory Visit (INDEPENDENT_AMBULATORY_CARE_PROVIDER_SITE_OTHER): Payer: Medicare Other

## 2021-07-29 ENCOUNTER — Encounter: Payer: Self-pay | Admitting: Pulmonary Disease

## 2021-07-29 VITALS — BP 104/60 | HR 107 | Temp 97.6°F | Ht 70.0 in | Wt 162.0 lb

## 2021-07-29 DIAGNOSIS — J910 Malignant pleural effusion: Secondary | ICD-10-CM

## 2021-07-29 DIAGNOSIS — J439 Emphysema, unspecified: Secondary | ICD-10-CM | POA: Diagnosis not present

## 2021-07-29 DIAGNOSIS — J449 Chronic obstructive pulmonary disease, unspecified: Secondary | ICD-10-CM

## 2021-07-29 DIAGNOSIS — F17210 Nicotine dependence, cigarettes, uncomplicated: Secondary | ICD-10-CM

## 2021-07-29 DIAGNOSIS — J90 Pleural effusion, not elsewhere classified: Secondary | ICD-10-CM

## 2021-07-29 DIAGNOSIS — R911 Solitary pulmonary nodule: Secondary | ICD-10-CM | POA: Diagnosis not present

## 2021-07-29 DIAGNOSIS — G8929 Other chronic pain: Secondary | ICD-10-CM

## 2021-07-29 DIAGNOSIS — C3490 Malignant neoplasm of unspecified part of unspecified bronchus or lung: Secondary | ICD-10-CM | POA: Diagnosis not present

## 2021-07-29 DIAGNOSIS — M545 Low back pain, unspecified: Secondary | ICD-10-CM | POA: Diagnosis not present

## 2021-07-29 DIAGNOSIS — J432 Centrilobular emphysema: Secondary | ICD-10-CM

## 2021-07-29 NOTE — Telephone Encounter (Signed)
Sch per 2/6 inbasket, pt aware. Attempted to schedule week fo 2/6 per PA REQUEST, pt said he needed at least 7 full days notice.

## 2021-07-29 NOTE — Telephone Encounter (Signed)
Last message error

## 2021-07-29 NOTE — Telephone Encounter (Signed)
Pt seen in office today, office POC machine currently not working. Provided pt with phone number to [employer] for best fit. Patient states he may wait until his 41mo f/u appt to be walk because it takes a lot to get dressed.   Made Dr. Loanne Drilling aware   Nothing further

## 2021-07-29 NOTE — Progress Notes (Signed)
Subjective:   PATIENT ID: Chad Gomez GENDER: male DOB: 02/16/1946, MRN: [id number]   HPI  Chief Complaint  Patient presents with   Follow-up    CXR resutls    Reason for Visit: Follow-up   Chad Gomez is a 76 year old male active smoker with very severe COPD/emphysema, stage IIIa adenocarcinoma of the left lung s/p resection and chemotherapy (incomplete due to intolerance) who presents with follow-up.   08/21/20 Since our last visit, he reports he is tolerating Stiolto. Rarely uses albuterol He is working on weight gain. Previously did water aerobics pre-pandemic four years ago. Still working on low impact strengthening exercise. Otherwise states he is not that active. Denies cough and wheezing. Has shortness of breath that seems to occur with carrying groceries and upstairs. He still smokes 2 ppd. He states he is interested in Pulmonary Rehab but has not been contacted.  05/06/21 He is compliant with his Stiolto daily. He does not know when to use his albuterol. He reports he has shortness of breath, cough and wheezing with short distances. Continues to be an active smoker.   05/30/21 Since our last visit, he was scheduled for navigational bronchoscopy however CT scan prior to procedure demonstrated interval development of left pleural effusion. I contacted him to schedule thoracentesis for therapeutic and diagnostic testing. He has had worsening shortness of breath over these last few weeks. No wheezing or cough.  07/18/21 Wife is present for this visit and provides additional history. Since our last visit he had PleurX catheter placed on 06/20/21. Initially had issues with excess drainage but that improved. He was treated for cellulitis two weeks ago after being evaluated by NP Cobb when he had his sutures removed. Redness has improved however he reports pain when laying on it and when frequent cough. He  drains it every 3-4 days. For the last two drains <50 cc. Denies recent fevers, chills.  07/29/21 He continues to be an active smoker, now smoking up to 1.5-2ppd a day when he was previously 1ppd Cough has improved after PleurX catheter removal. Denies wheezing or shortness of breath. He is compliant with his Stiolto which he feels provides relief. He is returning to exercise with light to moderate weight training. He has chronic lower back pain that has worsened after starting exercise.  Social History: Currently smoker. Former 2 ppd. Currently 1.5-2ppd.  Past Medical History:  Diagnosis Date   BPH (benign prostatic hypertrophy)    weak stream   Chronic kidney disease    Complication of anesthesia    urinary retention   COPD (chronic obstructive pulmonary disease) (HCC)    Diabetes mellitus without complication (HCC)    Type II   History of bladder stone    History of colon polyps    08/2013   pre-canerous   History of kidney stones    History of melanoma excision    LEFT EAR   Hyperlipidemia    Hypertension    Iron deficiency    Lung cancer, lingula ([place]) 11/01/2019   Open wound    left lower jaw; followed by ENT   Peripheral vascular disease ([place])    Right ureteral stone    SBO (small bowel obstruction) ([place]) 12/26/2019   Shingles 05-15-2015      Outpatient Medications Prior to Visit  Medication Sig Dispense Refill   acetaminophen (TYLENOL) 500 MG tablet Take 2 tablets (1,000 mg total) by mouth every 6 (six) hours as needed for mild pain. [place]  tablet 0   albuterol (VENTOLIN HFA) 108 (90 Base) MCG/ACT inhaler Inhale 2 puffs into the lungs every 6 (six) hours as needed for wheezing or shortness of breath. 8 g 3   amLODipine (NORVASC) 10 MG tablet Take 10 mg by mouth daily.     amLODipine (NORVASC) 10 MG tablet Take 1 tablet by mouth daily.     aspirin EC 81 MG tablet Take 81 mg by mouth daily.     atorvastatin (LIPITOR) 40 MG tablet Take 40 mg by mouth daily.     Cholecalciferol  (VITAMIN D-3) 1000 units CAPS Take 1,000 Units by mouth daily.      ezetimibe (ZETIA) 10 MG tablet Take 5 mg by mouth daily.     finasteride (PROSCAR) 5 MG tablet TAKE 1 TABLET BY MOUTH  DAILY 90 tablet 3   gabapentin (NEURONTIN) 100 MG capsule Take 100-200 mg by mouth See admin instructions. Take 200 in the morning, 100 mg in the afternoon and bedtime     metFORMIN (GLUCOPHAGE-XR) 500 MG 24 hr tablet Take 1,000 mg by mouth 2 (two) times daily. Morning and evening     Multiple Vitamins-Minerals (MULTIVITAMIN WITH MINERALS) tablet Take 1 tablet by mouth daily.     mupirocin ointment (BACTROBAN) 2 % Apply 1 application topically daily. With dressing changes. 22 g 0   olmesartan (BENICAR) 20 MG tablet Take 20 mg by mouth daily.     olmesartan (BENICAR) 40 MG tablet Take 40 mg by mouth daily.     oxybutynin (DITROPAN) 5 MG tablet Take 5 mg by mouth every morning.     potassium citrate (UROCIT-K) 10 MEQ (1080 MG) SR tablet Take 10 mEq by mouth 3 (three) times daily with meals.     Tiotropium Bromide-Olodaterol (STIOLTO RESPIMAT) 2.5-2.5 MCG/ACT AERS Inhale 2 puffs into the lungs daily. 12 g 3   traMADol (ULTRAM) 50 MG tablet Take 1 tablet (50 mg total) by mouth every 6 (six) hours as needed (mild pain). 20 tablet 0   doxycycline (VIBRA-TABS) 100 MG tablet Take 1 tablet (100 mg total) by mouth 2 (two) times daily. (Patient not taking: Reported on 07/18/2021) 20 tablet 0   No facility-administered medications prior to visit.    Review of Systems  Constitutional:  Negative for chills, diaphoresis, fever, malaise/fatigue and weight loss.  HENT:  Negative for congestion.   Respiratory:  Positive for cough. Negative for hemoptysis, sputum production, shortness of breath and wheezing.   Cardiovascular:  Negative for chest pain, palpitations and leg swelling.  Musculoskeletal:  Positive for back pain.    Objective:   Vitals:   07/29/21 1132  BP: 104/60  Pulse: (!) 107  Temp: 97.6 F (36.4 C)   TempSrc: Oral  SpO2: 93%  Weight: 162 lb (73.5 kg)  Height: 5\' 10"  (1.778 m)   SpO2: 93 % O2 Device: None (Room air)  Physical Exam: General: Chronically ill-appearing, no acute distress HENT: [place], AT Eyes: EOMI, no scleral icterus Respiratory: Diminished breath sounds bilaterally.  No crackles, wheezing or rales Cardiovascular: RRR, -M/R/G, no JVD Extremities:-Edema,-tenderness Neuro: AAO x4, CNII-XII grossly intact Psych: Normal mood, normal affect  Data Reviewed:  Imaging: CT Chest Lung Screen 08/25/16 - RUL apical lung nodule measuring 1.21mm. RLL 32mm calcified granuloma. Moderate emphysema. CT Chest Lung Screen 08/24/17 - Right apical lung nodule measuring 2.70mm. RLL 1.72mm calcified granuloma. Left apical pleural-parenchymal scarring. Moderate emphysema. CT Chest 04/27/20 - S/p left upper lobe wedge resection with surgical staple line. No interval pulmonary  nodules development. Emphysema.  CT Chest 11/05/20 - Nodular area adjacent to suture line of LUL resection measuring 2.0x2.4, previously 1.8 x 2.4 cm. New subpleural LUL nodule in anterior aspect measuring 69mm. Subpleural paraverterbral LLL nodule 10 x 58mm. CT Chest 02/05/21 - Nodular area adjacent to suture line stable 1.9 x 2.0. Subpleural LUL with interval growth to 32mm. Subpleural paravertebral LLL nodule with interval grown 1.6 x 1.3 cm PET/CT 02/19/21 - S/p LUL wedge resection. Hypermetabolic nodular soft tissue in LUL and 9mm LUL and paraverterbral LLL lesion measuring 1.6 x 1.3 cm CT Chest 05/22/21 - Interval development of moderate left pleural effusion CXR 05/30/21 - S/p thoracentesis. No pneumothorax. Partially loculated left pleural fluid, anterior>posterior CXR 07/18/21 - Tunneled pleural catheter, residual left pleural fluid/thickening.  CXR 07/29/21 - Stable small left pleural effusion with left pleural thickening/atelectasis. Unchanged left mid nodule.  PFT: 09/02/17 Spirometry FVC 1.6 (36%) FEV1 1.0 (31%) Ratio 63   Interpretation: Very severe obstructive defect  08/08/19 FVC 3.65 (87%) FEV1 1.83 (59%) Ratio 53  TLC 78% DLCO 64% Interpretation: Moderately severe mixed obstructive and restrictive defect with mildly reduced DLCO. Significant bronchodilator response in FEV1 and FVC.  Assessment & Plan:   Discussion: 76 year old male active smoker with COPD and history of stage IIIa adenocarcinoma of the lung s/p LUL resection in 2021. He completed two cycles before this was discontinued due to intolerance (enteritis, renal failure causing frequent admissions) and is currently under observation with oncology.   Chest imaging in August 2022 demonstrated interval growth of anterior subpleural LUL nodule from 71mm to 75mm and paraverterbral LLL nodule from 1x1cm to 1.6 x 1.3 cm compared to three months prior. On 04/2021 he had interval development of left loculated effusion positive for malignancy 05/30/21. Left PleurX catheter was placed on 06/20/21-07/18/21 with spontaneous pleurodesis.  Stage IV adenocarcinoma of the left lung Malignant left pleural effusion s/p spontaneous pleurodesis with PleurX --No further fluid accumulation --Message sent to patient's oncologist (Dr. Julien Nordmann). Please contact to schedule follow-up  Very severe COPD   --CONTINUE Stiolto TWO puffs ONCE a day  Back pain --Encourage regular exercise and stretching  Tobacco abuse Patient is an active smoker. We discussed smoking cessation for 5 minutes. We discussed triggers and stressors and ways to deal with them. We discussed barriers to continued smoking and benefits of smoking cessation. Provided patient with information cessation techniques and interventions including [place] quitline.  Health Maintenance Immunization History  Administered Date(s) Administered   DTaP 12/04/2015   Pfizer Covid-19 Vaccine Bivalent Booster 65yrs & up 08/21/2015, 05/13/2021   Pneumococcal Conjugate-13 05/04/2014, 09/02/2016   Pneumococcal  Polysaccharide-23 06/19/2015, 05/28/2020   Tdap 12/04/2015   CT Lung Screen - not qualified  No orders of the defined types were placed in this encounter.  No orders of the defined types were placed in this encounter.  Return in about 6 months (around 01/26/2022).   I have spent a total time of 34-minutes on the day of the appointment reviewing prior documentation, coordinating care and discussing medical diagnosis and plan with the patient/family. Past medical history, allergies, medications were reviewed. Pertinent imaging, labs and tests included in this note have been reviewed and interpreted independently by me.  [place], MD [facility] 07/29/2021 11:41 AM  Office Number [phone]

## 2021-07-29 NOTE — Patient Instructions (Signed)
Stage IV adenocarcinoma of the left lung Malignant left pleural effusion s/p PleuX catheter 06/20/22 - resolved --No further fluid accumulation --Message sent to patient's oncologist (Dr. Julien Nordmann). Please contact to schedule follow-up  Very severe COPD   --CONTINUE Stiolto TWO puffs ONCE a day  Back pain --Encourage regular exercise  Tobacco abuse --Active smoker --QUIT SMOKING

## 2021-08-05 ENCOUNTER — Telehealth: Payer: Self-pay | Admitting: Pulmonary Disease

## 2021-08-05 NOTE — Telephone Encounter (Signed)
Patient reports purplish skin color changes above insertion site.No fevers or chills. No expression of pus at site.  Offered appointment for today or tomorrow. Patient not available at Feb 15.  Please schedule him in my 10AM slot on Feb 15th. Ok to override block

## 2021-08-05 NOTE — Telephone Encounter (Signed)
Appt scheduled 08/07/2021 at 1:45. This time worked better for patient's schedule.  Nothing further needed.

## 2021-08-05 NOTE — Telephone Encounter (Signed)
Spoke to patient's spouse, Stanton Kidney. Stanton Kidney stated that patient had pleurx catheter removed 2 weeks ago.  Patient has bruising around catheter site, warm to the touch, soreness and redness x1wk. Stanton Kidney is concerned that patient needs an abx. Unsure if patient has had a fever.  Dr. Loanne Drilling, Please advise. Thanks

## 2021-08-07 ENCOUNTER — Ambulatory Visit: Payer: Medicare Other | Admitting: Pulmonary Disease

## 2021-08-08 ENCOUNTER — Telehealth: Payer: Self-pay | Admitting: Internal Medicine

## 2021-08-08 ENCOUNTER — Other Ambulatory Visit: Payer: Medicare Other

## 2021-08-08 ENCOUNTER — Ambulatory Visit: Payer: Medicare Other | Admitting: Internal Medicine

## 2021-08-08 NOTE — Telephone Encounter (Signed)
Sch per 2/16 inbasket, pt wife aware.

## 2021-08-19 ENCOUNTER — Other Ambulatory Visit: Payer: Self-pay

## 2021-08-19 ENCOUNTER — Inpatient Hospital Stay: Payer: Medicare Other | Admitting: Internal Medicine

## 2021-08-19 ENCOUNTER — Encounter: Payer: Self-pay | Admitting: Internal Medicine

## 2021-08-19 ENCOUNTER — Inpatient Hospital Stay: Payer: Medicare Other | Attending: Physician Assistant

## 2021-08-19 VITALS — BP 126/83 | HR 102 | Temp 97.5°F | Resp 18 | Ht 70.0 in | Wt 156.3 lb

## 2021-08-19 DIAGNOSIS — Z5112 Encounter for antineoplastic immunotherapy: Secondary | ICD-10-CM

## 2021-08-19 DIAGNOSIS — Z902 Acquired absence of lung [part of]: Secondary | ICD-10-CM | POA: Diagnosis not present

## 2021-08-19 DIAGNOSIS — C3410 Malignant neoplasm of upper lobe, unspecified bronchus or lung: Secondary | ICD-10-CM

## 2021-08-19 DIAGNOSIS — C3412 Malignant neoplasm of upper lobe, left bronchus or lung: Secondary | ICD-10-CM | POA: Diagnosis not present

## 2021-08-19 DIAGNOSIS — C771 Secondary and unspecified malignant neoplasm of intrathoracic lymph nodes: Secondary | ICD-10-CM | POA: Insufficient documentation

## 2021-08-19 DIAGNOSIS — Z79899 Other long term (current) drug therapy: Secondary | ICD-10-CM | POA: Insufficient documentation

## 2021-08-19 DIAGNOSIS — Z7982 Long term (current) use of aspirin: Secondary | ICD-10-CM | POA: Diagnosis not present

## 2021-08-19 DIAGNOSIS — Z7984 Long term (current) use of oral hypoglycemic drugs: Secondary | ICD-10-CM | POA: Insufficient documentation

## 2021-08-19 DIAGNOSIS — Z9221 Personal history of antineoplastic chemotherapy: Secondary | ICD-10-CM | POA: Insufficient documentation

## 2021-08-19 DIAGNOSIS — C3492 Malignant neoplasm of unspecified part of left bronchus or lung: Secondary | ICD-10-CM

## 2021-08-19 LAB — CBC WITH DIFFERENTIAL (CANCER CENTER ONLY)
Abs Immature Granulocytes: 0.03 10*3/uL (ref 0.00–0.07)
Basophils Absolute: 0.1 10*3/uL (ref 0.0–0.1)
Basophils Relative: 1 %
Eosinophils Absolute: 0.4 10*3/uL (ref 0.0–0.5)
Eosinophils Relative: 4 %
HCT: 44 % (ref 39.0–52.0)
Hemoglobin: 14.7 g/dL (ref 13.0–17.0)
Immature Granulocytes: 0 %
Lymphocytes Relative: 14 %
Lymphs Abs: 1.4 10*3/uL (ref 0.7–4.0)
MCH: 28.8 pg (ref 26.0–34.0)
MCHC: 33.4 g/dL (ref 30.0–36.0)
MCV: 86.1 fL (ref 80.0–100.0)
Monocytes Absolute: 0.8 10*3/uL (ref 0.1–1.0)
Monocytes Relative: 8 %
Neutro Abs: 7 10*3/uL (ref 1.7–7.7)
Neutrophils Relative %: 73 %
Platelet Count: 319 10*3/uL (ref 150–400)
RBC: 5.11 MIL/uL (ref 4.22–5.81)
RDW: 15 % (ref 11.5–15.5)
WBC Count: 9.7 10*3/uL (ref 4.0–10.5)
nRBC: 0 % (ref 0.0–0.2)

## 2021-08-19 LAB — CMP (CANCER CENTER ONLY)
ALT: 6 U/L (ref 0–44)
AST: 13 U/L — ABNORMAL LOW (ref 15–41)
Albumin: 3.8 g/dL (ref 3.5–5.0)
Alkaline Phosphatase: 106 U/L (ref 38–126)
Anion gap: 6 (ref 5–15)
BUN: 13 mg/dL (ref 8–23)
CO2: 31 mmol/L (ref 22–32)
Calcium: 10.4 mg/dL — ABNORMAL HIGH (ref 8.9–10.3)
Chloride: 101 mmol/L (ref 98–111)
Creatinine: 1 mg/dL (ref 0.61–1.24)
GFR, Estimated: >60 mL/min (ref >60)
Glucose, Bld: 121 mg/dL — ABNORMAL HIGH (ref 70–99)
Potassium: 4.1 mmol/L (ref 3.5–5.1)
Sodium: 138 mmol/L (ref 135–145)
Total Bilirubin: 0.5 mg/dL (ref 0.3–1.2)
Total Protein: 6.7 g/dL (ref 6.5–8.1)

## 2021-08-19 NOTE — Patient Instructions (Signed)
Steps to Quit Smoking Smoking tobacco is the leading cause of preventable death. It can affect almost every organ in the body. Smoking puts you and people around you at risk for many serious, long-lasting (chronic) diseases. Quitting smoking can be hard, but it is one of the best things that you can do for your health. It is never too late to quit. How do I get ready to quit? When you decide to quit smoking, make a plan to help you succeed. Before you quit: Pick a date to quit. Set a date within the next 2 weeks to give you time to prepare. Write down the reasons why you are quitting. Keep this list in places where you will see it often. Tell your family, friends, and co-workers that you are quitting. Their support is important. Talk with your doctor about the choices that may help you quit. Find out if your health insurance will pay for these treatments. Know the people, places, things, and activities that make you want to smoke (triggers). Avoid them. What first steps can I take to quit smoking? Throw away all cigarettes at home, at work, and in your car. Throw away the things that you use when you smoke, such as ashtrays and lighters. Clean your car. Make sure to empty the ashtray. Clean your home, including curtains and carpets. What can I do to help me quit smoking? Talk with your doctor about taking medicines and seeing a counselor at the same time. You are more likely to succeed when you do both. If you are pregnant or breastfeeding, talk with your doctor about counseling or other ways to quit smoking. Do not take medicine to help you quit smoking unless your doctor tells you to do so. To quit smoking: Quit right away Quit smoking totally, instead of slowly cutting back on how much you smoke over a period of time. Go to counseling. You are more likely to quit if you go to counseling sessions regularly. Take medicine You may take medicines to help you quit. Some medicines need a  prescription, and some you can buy over-the-counter. Some medicines may contain a drug called nicotine to replace the nicotine in cigarettes. Medicines may: Help you to stop having the desire to smoke (cravings). Help to stop the problems that come when you stop smoking (withdrawal symptoms). Your doctor may ask you to use: Nicotine patches, gum, or lozenges. Nicotine inhalers or sprays. Non-nicotine medicine that is taken by mouth. Find resources Find resources and other ways to help you quit smoking and remain smoke-free after you quit. These resources are most helpful when you use them often. They include: Online chats with a counselor. Phone quitlines. Printed self-help materials. Support groups or group counseling. Text messaging programs. Mobile phone apps. Use apps on your mobile phone or tablet that can help you stick to your quit plan. There are many free apps for mobile phones and tablets as well as websites. Examples include Quit Guide from the CDC and smokefree.gov  What things can I do to make it easier to quit?  Talk to your family and friends. Ask them to support and encourage you. Call a phone quitline (1-800-QUIT-NOW), reach out to support groups, or work with a counselor. Ask people who smoke to not smoke around you. Avoid places that make you want to smoke, such as: Bars. Parties. Smoke-break areas at work. Spend time with people who do not smoke. Lower the stress in your life. Stress can make you want to   smoke. Try these things to help your stress: Getting regular exercise. Doing deep-breathing exercises. Doing yoga. Meditating. Doing a body scan. To do this, close your eyes, focus on one area of your body at a time from head to toe. Notice which parts of your body are tense. Try to relax the muscles in those areas. How will I feel when I quit smoking? Day 1 to 3 weeks Within the first 24 hours, you may start to have some problems that come from quitting tobacco.  These problems are very bad 2-3 days after you quit, but they do not often last for more than 2-3 weeks. You may get these symptoms: Mood swings. Feeling restless, nervous, angry, or annoyed. Trouble concentrating. Dizziness. Strong desire for high-sugar foods and nicotine. Weight gain. Trouble pooping (constipation). Feeling like you may vomit (nausea). Coughing or a sore throat. Changes in how the medicines that you take for other issues work in your body. Depression. Trouble sleeping (insomnia). Week 3 and afterward After the first 2-3 weeks of quitting, you may start to notice more positive results, such as: Better sense of smell and taste. Less coughing and sore throat. Slower heart rate. Lower blood pressure. Clearer skin. Better breathing. Fewer sick days. Quitting smoking can be hard. Do not give up if you fail the first time. Some people need to try a few times before they succeed. Do your best to stick to your quit plan, and talk with your doctor if you have any questions or concerns. Summary Smoking tobacco is the leading cause of preventable death. Quitting smoking can be hard, but it is one of the best things that you can do for your health. When you decide to quit smoking, make a plan to help you succeed. Quit smoking right away, not slowly over a period of time. When you start quitting, seek help from your doctor, family, or friends. This information is not intended to replace advice given to you by your health care provider. Make sure you discuss any questions you have with your health care provider. Document Revised: 02/15/2021 Document Reviewed: 08/28/2018 Elsevier Patient Education  2022 Elsevier Inc.  

## 2021-08-19 NOTE — Progress Notes (Signed)
[place] [phone]   Fax:(336) [place], [place] [address]  DIAGNOSIS: Recurrent and metastatic non-small cell lung cancer initially diagnosed as stage IIIA (T2a, N2, M0) non-small cell lung cancer, adenocarcinoma.  Patient presented with a left upper lobe nodule. He was diagnosed in March 2021.  The patient had evidence for disease recurrence in December 2022   Biomarkers/PDL1:   Detected EGFR mutation in exon 21 Positive for the EGFR P848L mutation in exon 21. No mutation was detected in exons 12, 18, 19, and 20. The clinical significance of P848L in exon 21 is uncertain. This variant has been reported to behave like functionally silent polymorphism. [name], M.M., et al. Mol Cancer 6, 56 (2007). PD-L1 Expression Level: TPS < 1% PD-L1 Expression Status: No Expression NEGATIVE FOR ALK REARRANGEMENT   PRIOR THERAPY: 1) Robotic assisted wedge resection of the left upper lobe under the care of Dr. Servando Snare.  This was performed on 10/28/2019. 2) Adjuvant systemic chemotherapy on the [facility] A [id number] protocol with cisplatin 70 mg/M2, pemetrexed 500 mg/M2 every 3 weeks in addition to pembrolizumab 400 mg IV every 6 weeks.  Start date December 19, 2019.  Status post 2 cycles.  This was discontinued secondary to intolerance.   CURRENT THERAPY:  Observation.  INTERVAL HISTORY: Chad Gomez 76 y.o. male returns to the clinic today for follow-up visit accompanied by his wife.  He continues to complain of increasing fatigue and weakness.  He also has baseline shortness of breath increased with exertion.  He had recurrent pleural effusion and underwent Pleurx catheter placement for drainage of the effusion which was removed recently.  He was found on repeat imaging studies including CT scan of the chest as well as PET scan in August 2022 to have evidence for disease recurrence.  He was  referred to Dr. Loanne Drilling and he underwent ultrasound-guided thoracentesis with drainage of the left pleural effusion and it showed malignant cells consistent with adenocarcinoma.  Unfortunately the patient was lost to follow-up.  He finally made it back to the clinic for evaluation and discussion of his treatment options.  He denied having any nausea, vomiting, diarrhea or constipation.  He has no headache or visual changes.  He has no fever or chills.  He is here today for evaluation and discussion of his treatment options.  MEDICAL HISTORY: Past Medical History:  Diagnosis Date   BPH (benign prostatic hypertrophy)    weak stream   Chronic kidney disease    Complication of anesthesia    urinary retention   COPD (chronic obstructive pulmonary disease) (HCC)    Diabetes mellitus without complication (HCC)    Type II   History of bladder stone    History of colon polyps    08/2013   pre-canerous   History of kidney stones    History of melanoma excision    LEFT EAR   Hyperlipidemia    Hypertension    Iron deficiency    Lung cancer, lingula ([facility]) 11/01/2019   Open wound    left lower jaw; followed by ENT   Peripheral vascular disease ([facility])    Right ureteral stone    SBO (small bowel obstruction) ([facility]) 12/26/2019   Shingles 05-15-2015    ALLERGIES:  is allergic to gabapentin, morphine and related, tape, allevyn adhesive [wound dressings], amoxicillin, and naproxen.  MEDICATIONS:  Current Outpatient Medications  Medication Sig Dispense Refill  acetaminophen (TYLENOL) 500 MG tablet Take 2 tablets (1,000 mg total) by mouth every 6 (six) hours as needed for mild pain. 30 tablet 0   albuterol (VENTOLIN HFA) 108 (90 Base) MCG/ACT inhaler Inhale 2 puffs into the lungs every 6 (six) hours as needed for wheezing or shortness of breath. 8 g 3   amLODipine (NORVASC) 10 MG tablet Take 10 mg by mouth daily.     aspirin EC 81 MG tablet Take 81 mg by mouth daily.     atorvastatin (LIPITOR) 40 MG  tablet Take 40 mg by mouth daily.     Cholecalciferol (VITAMIN D-3) 1000 units CAPS Take 1,000 Units by mouth daily.      ezetimibe (ZETIA) 10 MG tablet Take 5 mg by mouth daily.     finasteride (PROSCAR) 5 MG tablet TAKE 1 TABLET BY MOUTH  DAILY 90 tablet 3   gabapentin (NEURONTIN) 100 MG capsule Take 100-200 mg by mouth See admin instructions. Take 200 in the morning, 100 mg in the afternoon and bedtime     metFORMIN (GLUCOPHAGE-XR) 500 MG 24 hr tablet Take 1,000 mg by mouth 2 (two) times daily. Morning and evening     Multiple Vitamins-Minerals (MULTIVITAMIN WITH MINERALS) tablet Take 1 tablet by mouth daily.     potassium citrate (UROCIT-K) 10 MEQ (1080 MG) SR tablet Take 10 mEq by mouth 3 (three) times daily with meals.     Tiotropium Bromide-Olodaterol (STIOLTO RESPIMAT) 2.5-2.5 MCG/ACT AERS Inhale 2 puffs into the lungs daily. 12 g 3   mupirocin ointment (BACTROBAN) 2 % Apply 1 application topically daily. With dressing changes. 22 g 0   olmesartan (BENICAR) 20 MG tablet Take 20 mg by mouth daily.     olmesartan (BENICAR) 40 MG tablet Take 40 mg by mouth daily.     oxybutynin (DITROPAN) 5 MG tablet Take 5 mg by mouth every morning.     traMADol (ULTRAM) 50 MG tablet Take 1 tablet (50 mg total) by mouth every 6 (six) hours as needed (mild pain). (Patient not taking: Reported on 08/19/2021) 20 tablet 0   No current facility-administered medications for this visit.    SURGICAL HISTORY:  Past Surgical History:  Procedure Laterality Date   ABDOMINAL AORTAGRAM  11/01/2014   Procedure: Abdominal Aortagram;  Surgeon: Serafina Mitchell, MD;  Location: [facility];  Service: Cardiovascular;;   AORTA - BILATERAL FEMORAL ARTERY BYPASS GRAFT N/A 12/28/2014   Procedure: AORTOBIFEMORAL BYPASS GRAFT;  Surgeon: Serafina Mitchell, MD;  Location: [facility];  Service: Vascular;  Laterality: N/A;   BRONCHIAL BRUSHINGS  09/06/2019   Procedure: BRONCHIAL BRUSHINGS;  Surgeon: Collene Gobble, MD;  Location: [facility]  ENDOS[facility];  Service: Pulmonary;;  left upper lobe nodule   BRONCHIAL WASHINGS  09/06/2019   Procedure: BRONCHIAL WASHINGS;  Surgeon: Collene Gobble, MD;  Location: [facility] ENDOSCOPY;  Service: Pulmonary;;   CHEST TUBE INSERTION N/A 06/20/2021   Procedure: INSERTION PLEURAL DRAINAGE CATHETER;  Surgeon: Margaretha Seeds, MD;  Location: [facility];  Service: Cardiopulmonary;  Laterality: N/A;   COLONOSCOPY W/ POLYPECTOMY  09-13-2013   CYSTOLITHALOPAXY OF BLADDER STONE/ TRANSRECTAL ULTRASOUND PROSTATE BX  08-19-2005   CYSTOSCOPY W/ RETROGRADES Right 12/02/2012   Procedure: CYSTOSCOPY WITH RETROGRADE PYELOGRAM;  Surgeon: Malka So, MD;  Location: [facility];  Service: Urology;  Laterality: Right;   CYSTOSCOPY W/ RETROGRADES Bilateral 10/13/2013   Procedure: CYSTOSCOPY WITH RETROGRADE PYELOGRAM;  Surgeon: Irine Seal, MD;  Location: [facility];  Service: Urology;  Laterality: Bilateral;   CYSTOSCOPY W/ URETERAL STENT PLACEMENT Left 04/11/2014   Procedure: CYSTOSCOPY WITH STENT REPLACEMENT;  Surgeon: Malka So, MD;  Location: [facility];  Service: Urology;  Laterality: Left;   CYSTOSCOPY WITH LITHOLAPAXY Right 12/02/2012   Procedure: CYSTOSCOPY WITH stone extraction;  Surgeon: Malka So, MD;  Location: [facility];  Service: Urology;  Laterality: Right;   CYSTOSCOPY WITH LITHOLAPAXY Right 10/13/2013   Procedure: CYSTOSCOPY WITH LITHOLAPAXY, removal of urethral stone with basket;  Surgeon: Irine Seal, MD;  Location: [facility];  Service: Urology;  Laterality: Right;   CYSTOSCOPY WITH RETROGRADE PYELOGRAM, URETEROSCOPY AND STENT PLACEMENT Left 03/21/2014   Procedure: CYSTOSCOPY WITH RETROGRADE PYELOGRAM, AND LEFT STENT PLACEMENT;  Surgeon: Alexis Frock, MD;  Location: [facility];  Service: Urology;  Laterality: Left;   CYSTOSCOPY WITH RETROGRADE PYELOGRAM, URETEROSCOPY AND STENT PLACEMENT  09/09/2016   Procedure: CYSTOSCOPY WITH  RIGHT  RETROGRADE PYELOGRAM, URETEROSCOPY WITH LASER BASKET EXTRACTION OF STONES  AND STENT PLACEMENT;  Surgeon: Irine Seal, MD;  Location[facility];  Service: Urology;;   CYSTOSCOPY WITH URETEROSCOPY Left 04/11/2014   Procedure: CYSTOSCOPY WITH URETEROSCOPY/ STONE EXTRACTION;  Surgeon: Malka So, MD;  Location: [facility];  Service: Urology;  Laterality: Left;   FINE NEEDLE ASPIRATION  09/06/2019   Procedure: FINE NEEDLE ASPIRATION;  Surgeon: Collene Gobble, MD;  Location: [facility] ENDOSCOPY;  Service: Pulmonary;;  left upper lobe - lung   HOLMIUM LASER APPLICATION Right 12/31/6267   Procedure: HOLMIUM LASER APPLICATION;  Surgeon: Malka So, MD;  Location: [facility];  Service: Urology;  Laterality: Right;   HOLMIUM LASER APPLICATION Left 48/54/6270   Procedure: HOLMIUM LASER APPLICATION;  Surgeon: Malka So, MD;  Location: [facility];  Service: Urology;  Laterality: Left;   HOLMIUM LASER APPLICATION Right 3/50/0938   Procedure: HOLMIUM LASER APPLICATION;  Surgeon: Irine Seal, MD;  Location: [facility];  Service: Urology;  Laterality: Right;   INGUINAL HERNIA REPAIR Right 1954   INTERCOSTAL NERVE BLOCK Left 10/28/2019   Procedure: Intercostal Nerve Block;  Surgeon: [name], MD;  Location: [place];  Service: Thoracic;  Laterality: Left;   LITHOTRIPSY  2007 & 02/2013   LUNG BIOPSY  09/06/2019   Procedure: LUNG BIOPSY;  Surgeon: Collene Gobble, MD;  Location: [facility] ENDOSCOPY;  Service: Pulmonary;;  left upper lob lung   MANDIBLE FRACTURE SURGERY  1995   MICROLARYNGOSCOPY Right 02/18/2016   Procedure: MICRO DIRECT LARYNGOSCOPY EXCISIONAL BIOPSY OF RIGHT VOCAL CORD MASS;  Surgeon: Leta Baptist, MD;  Location: [place];  Service: ENT;  Laterality: Right;   [place]   left ear   MULTIPLE TOOTH [place]   NEPHROLITHOTOMY Left 01/27/2013   Procedure: NEPHROLITHOTOMY PERCUTANEOUS FIRST  LOOK;  Surgeon: Malka So, MD;  Location: [facility];  Service: Urology;  Laterality: Left;   NODE DISSECTION  10/28/2019   Procedure: Node Dissection;  Surgeon: [name], MD;  Location: [place];  Service: Thoracic;;   PERIPHERAL VASCULAR CATHETERIZATION N/A 11/01/2014   Procedure: Lower Extremity Angiography;  Surgeon: Serafina Mitchell, MD;  Location: [facility];  Service: Cardiovascular;  Laterality: N/A;   URETEROSCOPY Right 12/02/2012   Procedure: RIGHT URETEROSCOPY STONE EXTRACTION WITH  STENT PLACEMENT;  Surgeon: Malka So, MD;  Location: [facility];  Service: Urology;  Laterality: Right;  VIDEO BRONCHOSCOPY  10/28/2019   VIDEO BRONCHOSCOPY WITH ENDOBRONCHIAL NAVIGATION WITH LUNG MARKINGS (N/A   VIDEO BRONCHOSCOPY WITH ENDOBRONCHIAL NAVIGATION N/A 09/06/2019   Procedure: VIDEO BRONCHOSCOPY WITH ENDOBRONCHIAL NAVIGATION;  Surgeon: Collene Gobble, MD;  Location: [facility];  Service: Pulmonary;  Laterality: N/A;   VIDEO BRONCHOSCOPY WITH ENDOBRONCHIAL NAVIGATION N/A 10/28/2019   Procedure: VIDEO BRONCHOSCOPY WITH ENDOBRONCHIAL NAVIGATION WITH LUNG MARKINGS;  Surgeon: [name], MD;  Location: [place];  Service: Thoracic;  Laterality: N/A;   WEDGE RESECTION  10/28/2019   LEFT UPPER LOBE    REVIEW OF SYSTEMS:  Constitutional: positive for anorexia, fatigue, and weight loss Eyes: negative Ears, nose, mouth, throat, and face: negative Respiratory: positive for dyspnea on exertion Cardiovascular: negative Gastrointestinal: negative Genitourinary:negative Integument/breast: negative Hematologic/lymphatic: negative Musculoskeletal:positive for muscle weakness Neurological: negative Behavioral/Psych: negative Endocrine: negative Allergic/Immunologic: negative   PHYSICAL EXAMINATION: General appearance: alert, cooperative, fatigued, and no distress Head: Normocephalic, without obvious abnormality, atraumatic Neck: no adenopathy, no JVD, supple,  symmetrical, trachea midline, and thyroid not enlarged, symmetric, no tenderness/mass/nodules Lymph nodes: Cervical, supraclavicular, and axillary nodes normal. Resp: diminished breath sounds LLL and dullness to percussion LLL Back: symmetric, no curvature. ROM normal. No CVA tenderness. Cardio: regular rate and rhythm, S1, S2 normal, no murmur, click, rub or gallop GI: soft, non-tender; bowel sounds normal; no masses,  no organomegaly Extremities: extremities normal, atraumatic, no cyanosis or edema Neurologic: Alert and oriented X 3, normal strength and tone. Normal symmetric reflexes. Normal coordination and gait  ECOG PERFORMANCE STATUS: 1 - Symptomatic but completely ambulatory  Blood pressure 126/83, pulse (!) 102, temperature (!) 97.5 F (36.4 C), temperature source Tympanic, resp. rate 18, height $RemoveBe'5\' 10"'[id number]$  (1.778 m), weight 156 lb 5 oz (70.9 kg), SpO2 96 %.  LABORATORY DATA: Lab Results  Component Value Date   WBC 9.7 08/19/2021   HGB 14.7 08/19/2021   HCT 44.0 08/19/2021   MCV 86.1 08/19/2021   PLT 319 08/19/2021      Chemistry      Component Value Date/Time   NA 138 08/19/2021 1105   K 4.1 08/19/2021 1105   CL 101 08/19/2021 1105   CO2 31 08/19/2021 1105   BUN 13 08/19/2021 1105   CREATININE 1.00 08/19/2021 1105      Component Value Date/Time   CALCIUM 10.4 (H) 08/19/2021 1105   ALKPHOS 106 08/19/2021 1105   AST 13 (L) 08/19/2021 1105   ALT 6 08/19/2021 1105   BILITOT 0.5 08/19/2021 1105       RADIOGRAPHIC STUDIES: DG Chest 2 View  Result Date: 07/29/2021 CLINICAL DATA:  Pleural effusion, history non-small cell lung cancer EXAM: CHEST - 2 VIEW COMPARISON:  07/18/2021 FINDINGS: Normal heart size, mediastinal contours, and pulmonary vascularity. Atherosclerotic calcification aorta. Emphysematous and bronchitic changes consistent with COPD. Interval removal of LEFT PleurX catheter. Stable small LEFT pleural effusion versus previous exam. Increased atelectasis versus  infiltrate in the LEFT lower lobe. Again identified nodular density LEFT mid lung 17 mm diameter at LEFT apex scarring. No pneumothorax or acute osseous findings. IMPRESSION: COPD changes with increased atelectasis versus consolidation in LEFT lower lobe. Persistent LEFT pleural effusion. Persistent LEFT midlung nodule. Electronically Signed   By: Lavonia Dana M.D.   On: 07/29/2021 18:10     ASSESSMENT AND PLAN: This is a very pleasant 76 years old white male recently diagnosed with stage IIIa (T2 a, N2, M0) non-small cell lung cancer, adenocarcinoma presented with left upper lobe lung nodule and after resection the patient  was found to have metastatic disease to the mediastinal lymphadenopathy.  This was diagnosed in March 2021.   He is currently undergoing adjuvant systemic chemotherapy with cisplatin, pemetrexed and pembrolizumab status post 2 cycles. His treatment was discontinued secondary to significant intolerability to this treatment with frequent admission to the hospital with enteritis, acute renal failure as well as pancytopenia. The patient is currently on observation and he is feeling fine today with no concerning complaints except for mild fatigue. He had repeat CT scan of the chest performed recently.  I personally and independently reviewed the scan images and discussed the results with the patient and his wife today. Unfortunately his scan showed increase in the size of a peripheral nodule within the left upper lobe suspicious for new site of disease. His PET scan showed focal nodular soft tissue adjacent to the left upper lobe postsurgical changes with hypermetabolic activity.  There was also hypermetabolic peripheral left upper lobe nodule and multiple hypermetabolic left pleural nodules the largest located within the paravertebral aspect of the posterior left lower lobe with new trace left pleural effusion.  But no evidence of metastatic disease in the abdomen or pelvis. The patient  underwent ultrasound-guided left thoracentesis in December 2022 and the final cytology was consistent with metastatic adenocarcinoma.  He was lost to follow-up for several months but finally showed up to the clinic for discussion of his treatment options. I had a lengthy discussion with the patient and his wife today about his current condition and treatment options. I explained to the patient that he is currently having an incurable condition and all the treatment will be of palliative nature. I given the option of palliative care and hospice referral versus consideration of palliative systemic chemotherapy with carboplatin, Alimta and Keytruda for 4 cycles followed by maintenance treatment with Alimta and Keytruda.  The patient is very hesitant about receiving chemotherapy because of his previous experience during the adjuvant therapy. He and his wife would like some time to think about their options before making a decision. They will call the office with their decision and we will be happy to help him either way whether palliative care and hospice or palliative systemic chemotherapy. He was advised to call immediately if he has any other concerning symptoms in the interval. The patient voices understanding of current disease status and treatment options and is in agreement with the current care plan. All questions were answered. The patient knows to call the clinic with any problems, questions or concerns. We can certainly see the patient much sooner if necessary.  Disclaimer: This note was dictated with voice recognition software. Similar sounding words can inadvertently be transcribed and may not be corrected upon review.

## 2021-08-26 ENCOUNTER — Ambulatory Visit: Payer: Medicare Other | Admitting: Pulmonary Disease

## 2021-08-26 ENCOUNTER — Other Ambulatory Visit: Payer: Self-pay

## 2021-08-26 ENCOUNTER — Encounter: Payer: Self-pay | Admitting: Pulmonary Disease

## 2021-08-26 VITALS — BP 112/60 | HR 98 | Wt 153.8 lb

## 2021-08-26 DIAGNOSIS — L03313 Cellulitis of chest wall: Secondary | ICD-10-CM

## 2021-08-26 DIAGNOSIS — E538 Deficiency of other specified B group vitamins: Secondary | ICD-10-CM | POA: Diagnosis not present

## 2021-08-26 MED ORDER — CEPHALEXIN 500 MG PO CAPS: 500.0000 mg | ORAL_CAPSULE | Freq: Four times a day (QID) | ORAL | 0 refills | Status: AC

## 2021-08-26 NOTE — Progress Notes (Signed)
Subjective:   PATIENT ID: Chad Gomez GENDER: male DOB: Dec 17, 1945, MRN: [id number]   HPI  Chief Complaint  Patient presents with   Follow-up    Plurex spot red and bruised     Reason for Visit: Follow-up   [name] is a 76 year old male active smoker with very severe COPD/emphysema, stage IIIa adenocarcinoma of the left lung s/p resection and chemotherapy (incomplete due to intolerance) who presents with follow-up.   08/21/20 Since our last visit, he reports he is tolerating Stiolto. Rarely uses albuterol He is working on weight gain. Previously did water aerobics pre-pandemic four years ago. Still working on low impact strengthening exercise. Otherwise states he is not that active. Denies cough and wheezing. Has shortness of breath that seems to occur with carrying groceries and upstairs. He still smokes 2 ppd. He states he is interested in Pulmonary Rehab but has not been contacted.  05/06/21 He is compliant with his Stiolto daily. He does not know when to use his albuterol. He reports he has shortness of breath, cough and wheezing with short distances. Continues to be an active smoker.   05/30/21 Since our last visit, he was scheduled for navigational bronchoscopy however CT scan prior to procedure demonstrated interval development of left pleural effusion. I contacted him to schedule thoracentesis for therapeutic and diagnostic testing. He has had worsening shortness of breath over these last few weeks. No wheezing or cough.  07/18/21 Wife is present for this visit and provides additional history. Since our last visit he had PleurX catheter placed on 06/20/21. Initially had issues with excess drainage but that improved. He was treated for cellulitis two weeks ago after being evaluated by NP Cobb when he had his sutures removed. Redness has improved however he reports pain when laying on it and when  frequent cough. He drains it every 3-4 days. For the last two drains <50 cc. Denies recent fevers, chills.  07/29/21 He continues to be an active smoker, now smoking up to 1.5-2ppd a day when he was previously 1ppd Cough has improved after PleurX catheter removal. Denies wheezing or shortness of breath. He is compliant with his Stiolto which he feels provides relief. He is returning to exercise with light to moderate weight training. He has chronic lower back pain that has worsened after starting exercise.  08/26/21 Reports redness at prior PleurX site.  Denies exquisite tenderness or pus. Skin feels hot at times. He also reports chronic back pain that he has been using lidocaine patches with.  Denies fevers.  Reports chills.  Social History: Currently smoker. Former 2 ppd. Currently 1.5-2ppd.  Past Medical History:  Diagnosis Date   BPH (benign prostatic hypertrophy)    weak stream   Chronic kidney disease    Complication of anesthesia    urinary retention   COPD (chronic obstructive pulmonary disease) (HCC)    Diabetes mellitus without complication (HCC)    Type II   History of bladder stone    History of colon polyps    08/2013   pre-canerous   History of kidney stones    History of melanoma excision    LEFT EAR   Hyperlipidemia    Hypertension    Iron deficiency    Lung cancer, lingula ([place]) 11/01/2019   Open wound    left lower jaw; followed by ENT   Peripheral vascular disease ([place])    Right ureteral stone    SBO (small bowel obstruction) ([place]) 12/26/2019  Shingles 05-15-2015      Outpatient Medications Prior to Visit  Medication Sig Dispense Refill   acetaminophen (TYLENOL) 500 MG tablet Take 2 tablets (1,000 mg total) by mouth every 6 (six) hours as needed for mild pain. 30 tablet 0   albuterol (VENTOLIN HFA) 108 (90 Base) MCG/ACT inhaler Inhale 2 puffs into the lungs every 6 (six) hours as needed for wheezing or shortness of breath. 8 g 3   amLODipine (NORVASC) 10 MG  tablet Take 10 mg by mouth daily.     aspirin EC 81 MG tablet Take 81 mg by mouth daily.     atorvastatin (LIPITOR) 40 MG tablet Take 40 mg by mouth daily.     Cholecalciferol (VITAMIN D-3) 1000 units CAPS Take 1,000 Units by mouth daily.      ezetimibe (ZETIA) 10 MG tablet Take 5 mg by mouth daily.     finasteride (PROSCAR) 5 MG tablet TAKE 1 TABLET BY MOUTH  DAILY 90 tablet 3   gabapentin (NEURONTIN) 100 MG capsule Take 100-200 mg by mouth See admin instructions. Take 200 in the morning, 100 mg in the afternoon and bedtime     metFORMIN (GLUCOPHAGE-XR) 500 MG 24 hr tablet Take 1,000 mg by mouth 2 (two) times daily. Morning and evening     Multiple Vitamins-Minerals (MULTIVITAMIN WITH MINERALS) tablet Take 1 tablet by mouth daily.     mupirocin ointment (BACTROBAN) 2 % Apply 1 application topically daily. With dressing changes. 22 g 0   olmesartan (BENICAR) 20 MG tablet Take 20 mg by mouth daily.     olmesartan (BENICAR) 40 MG tablet Take 40 mg by mouth daily.     oxybutynin (DITROPAN) 5 MG tablet Take 5 mg by mouth every morning.     potassium citrate (UROCIT-K) 10 MEQ (1080 MG) SR tablet Take 10 mEq by mouth 3 (three) times daily with meals.     Tiotropium Bromide-Olodaterol (STIOLTO RESPIMAT) 2.5-2.5 MCG/ACT AERS Inhale 2 puffs into the lungs daily. 12 g 3   traMADol (ULTRAM) 50 MG tablet Take 1 tablet (50 mg total) by mouth every 6 (six) hours as needed (mild pain). 20 tablet 0   No facility-administered medications prior to visit.    Review of Systems  Constitutional:  Negative for chills, diaphoresis, fever, malaise/fatigue and weight loss.  HENT:  Negative for congestion.   Respiratory:  Negative for cough, hemoptysis, sputum production, shortness of breath and wheezing.   Cardiovascular:  Negative for chest pain, palpitations and leg swelling.    Objective:   Vitals:   08/26/21 1602  BP: 112/60  Pulse: 98  SpO2: 94%  Weight: 153 lb 12.8 oz (69.8 kg)   SpO2: 94 % O2  Device: None (Room air)  Physical Exam: General: Chronically ill-appearing, no acute distress HENT: [place], AT Eyes: EOMI, no scleral icterus Skin: Mild erythema surrounding prior Pleurx site, rectangular outline.  No expressible pus Neuro: AAO x4, CNII-XII grossly intact Psych: Normal mood, normal affect  Data Reviewed:  Imaging: CT Chest Lung Screen 08/25/16 - RUL apical lung nodule measuring 1.6mm. RLL 81mm calcified granuloma. Moderate emphysema. CT Chest Lung Screen 08/24/17 - Right apical lung nodule measuring 2.56mm. RLL 1.58mm calcified granuloma. Left apical pleural-parenchymal scarring. Moderate emphysema. CT Chest 04/27/20 - S/p left upper lobe wedge resection with surgical staple line. No interval pulmonary nodules development. Emphysema.  CT Chest 11/05/20 - Nodular area adjacent to suture line of LUL resection measuring 2.0x2.4, previously 1.8 x 2.4 cm. New subpleural LUL nodule  in anterior aspect measuring 32mm. Subpleural paraverterbral LLL nodule 10 x 57mm. CT Chest 02/05/21 - Nodular area adjacent to suture line stable 1.9 x 2.0. Subpleural LUL with interval growth to 79mm. Subpleural paravertebral LLL nodule with interval grown 1.6 x 1.3 cm PET/CT 02/19/21 - S/p LUL wedge resection. Hypermetabolic nodular soft tissue in LUL and 63mm LUL and paraverterbral LLL lesion measuring 1.6 x 1.3 cm CT Chest 05/22/21 - Interval development of moderate left pleural effusion CXR 05/30/21 - S/p thoracentesis. No pneumothorax. Partially loculated left pleural fluid, anterior>posterior CXR 07/18/21 - Tunneled pleural catheter, residual left pleural fluid/thickening.  CXR 07/29/21 - Stable small left pleural effusion with left pleural thickening/atelectasis. Unchanged left mid nodule.  PFT: 09/02/17 Spirometry FVC 1.6 (36%) FEV1 1.0 (31%) Ratio 63  Interpretation: Very severe obstructive defect  08/08/19 FVC 3.65 (87%) FEV1 1.83 (59%) Ratio 53  TLC 78% DLCO 64% Interpretation: Moderately severe mixed  obstructive and restrictive defect with mildly reduced DLCO. Significant bronchodilator response in FEV1 and FVC.  Assessment & Plan:   Discussion: 76 year old male active smoker with COPD and history of stage IIIa adenocarcinoma of the lung status post left upper lobe resection in 2021.  He completed 2 cycles before this was discontinued due to intolerance and now unfortunately has stage IV adenocarcinoma presenting as malignant left pleural effusion.  Left Pleurx catheter was placed 06/20/2021 and removed on 07/18/2021 after spontaneous pleurodesis.  Today he presents with mild erythema on the left chest wall adjacent to his prior Pleurx incision site.  Differential includes cellulitis versus contact dermatitis.  Patient is anticipating hiatal hernia repair.  We will go ahead and treat for possible infection.  Left thorax cellulitis --START keflex 500 mg four times a day for total of 7 days  Stage IV adenocarcinoma of the left lung Malignant left pleural effusion s/p spontaneous pleurodesis with PleurX -- Continue follow-up with oncology  Very severe COPD   --CONTINUE Stiolto TWO puffs ONCE a day  Tobacco abuse Patient is an active smoker.  Health Maintenance Immunization History  Administered Date(s) Administered   DTaP 12/04/2015   Pfizer Covid-19 Vaccine Bivalent Booster 65yrs & up 08/21/2015, 05/13/2021   Pneumococcal Conjugate-13 05/04/2014, 09/02/2016   Pneumococcal Polysaccharide-23 06/19/2015, 05/28/2020   Tdap 12/04/2015   CT Lung Screen - not qualified  No orders of the defined types were placed in this encounter.  Meds ordered this encounter  Medications   cephALEXin (KEFLEX) 500 MG capsule    Sig: Take 1 capsule (500 mg total) by mouth 4 (four) times daily for 7 days.    Dispense:  28 capsule    Refill:  0   No follow-ups on file.   I have spent a total time of 35-minutes on the day of the appointment reviewing prior documentation, coordinating care and  discussing medical diagnosis and plan with the patient/family. Past medical history, allergies, medications were reviewed. Pertinent imaging, labs and tests included in this note have been reviewed and interpreted independently by me.   [place], MD [facility] 08/26/2021 5:04 PM  Office Number [phone]

## 2021-08-26 NOTE — Patient Instructions (Signed)
Left thorax cellulitis ?--START keflex 500 mg four times a day for total of 7 days ? ? ?

## 2021-08-29 DIAGNOSIS — I739 Peripheral vascular disease, unspecified: Secondary | ICD-10-CM | POA: Insufficient documentation

## 2021-08-29 NOTE — Progress Notes (Unsigned)
Cardiology Office Note   Date:  08/29/2021   ID:  Chad, Gomez [birth year], MRN [id number]  PCP:  [name], MD  Cardiologist:   Minus Breeding, MD Referring:  Ceasar Mons, MD  No chief complaint on file.     History of Present Illness: Chad Gomez is a 76 y.o. male who was sent by Dr. Servando Snare for preop clearance prior to resection of a small cell lung cancer.  He is seen by Dr. Lamonte Sakai for evaluation of severe COPD. In 2021 he had a negative Lexiscan Myoview.  He has stage IIIa non-small cell lung cancer of the left upper lobe with metastatic  lymphadenopathy.  He had wedge resection robotic assisted.  He was diagnosed with recurrent disease and last month was considering further treatment vs palliative care.   ***   ***  He did not tolerate chemotherapy and is currently off any other therapies.  He says he is feeling well.  He denies any cardiovascular symptoms.  He is not very active but with his levels of activity he denies any chest discomfort, neck or arm discomfort.  He has some mild cough but is not having any new shortness of breath, PND or orthopnea.  He is not having any new palpitations, presyncope or syncope.  He has had no weight gain or edema.   Past Medical History:  Diagnosis Date   BPH (benign prostatic hypertrophy)    weak stream   Chronic kidney disease    Complication of anesthesia    urinary retention   COPD (chronic obstructive pulmonary disease) (HCC)    Diabetes mellitus without complication (HCC)    Type II   History of bladder stone    History of colon polyps    08/2013   pre-canerous   History of kidney stones    History of melanoma excision    LEFT EAR   Hyperlipidemia    Hypertension    Iron deficiency    Lung cancer, lingula ([place]) 11/01/2019   Open wound    left lower jaw; followed by ENT   Peripheral vascular disease ([place])    Right ureteral stone    SBO (small bowel obstruction) ([place]) 12/26/2019   Shingles 05-15-2015     Past Surgical History:  Procedure Laterality Date   ABDOMINAL AORTAGRAM  11/01/2014   Procedure: Abdominal Aortagram;  Surgeon: Serafina Mitchell, MD;  Location: [place] [facility];  Service: Cardiovascular;;   AORTA - BILATERAL FEMORAL ARTERY BYPASS GRAFT N/A 12/28/2014   Procedure: AORTOBIFEMORAL BYPASS GRAFT;  Surgeon: Serafina Mitchell, MD;  Location: [place];  Service: Vascular;  Laterality: N/A;   BRONCHIAL BRUSHINGS  09/06/2019   Procedure: BRONCHIAL BRUSHINGS;  Surgeon: Collene Gobble, MD;  Location[facility] END[facility];  Service: Pulmonary;;  left upper lobe nodule   BRONCHIAL WASHINGS  09/06/2019   Procedure: BRONCHIAL WASHINGS;  Surgeon: Collene Gobble, MD;  Location: [facility];  Service: Pulmonary;;   CHEST TUBE INSERTION N/A 06/20/2021   Procedure: INSERTION PLEURAL DRAINAGE CATHETER;  Surgeon: Margaretha Seeds, MD;  Location: [facility];  Service: Cardiopulmonary;  Laterality: N/A;   COLONOSCOPY W/ POLYPECTOMY  09-13-2013   CYSTOLITHALOPAXY OF BLADDER STONE/ TRANSRECTAL ULTRASOUND PROSTATE BX  08-19-2005   CYSTOSCOPY W/ RETROGRADES Right 12/02/2012   Procedure: CYSTOSCOPY WITH RETROGRADE PYELOGRAM;  Surgeon: Malka So, MD;  Location: [facility];  Service: Urology;  Laterality: Right;   CYSTOSCOPY W/ RETROGRADES Bilateral 10/13/2013   Procedure: CYSTOSCOPY WITH  RETROGRADE PYELOGRAM;  Surgeon: Irine Seal, MD;  Location: [facility];  Service: Urology;  Laterality: Bilateral;   CYSTOSCOPY W/ URETERAL STENT PLACEMENT Left 04/11/2014   Procedure: CYSTOSCOPY WITH STENT REPLACEMENT;  Surgeon: Malka So, MD;  Location: [facility];  Service: Urology;  Laterality: Left;   CYSTOSCOPY WITH LITHOLAPAXY Right 12/02/2012   Procedure: CYSTOSCOPY WITH stone extraction;  Surgeon: Malka So, MD;  Location: [facility];  Service: Urology;  Laterality: Right;   CYSTOSCOPY WITH LITHOLAPAXY Right 10/13/2013   Procedure: CYSTOSCOPY WITH  LITHOLAPAXY, removal of urethral stone with basket;  Surgeon: Irine Seal, MD;  Location: [facility];  Service: Urology;  Laterality: Right;   CYSTOSCOPY WITH RETROGRADE PYELOGRAM, URETEROSCOPY AND STENT PLACEMENT Left 03/21/2014   Procedure: CYSTOSCOPY WITH RETROGRADE PYELOGRAM, AND LEFT STENT PLACEMENT;  Surgeon: Alexis Frock, MD;  Location: [facility];  Service: Urology;  Laterality: Left;   CYSTOSCOPY WITH RETROGRADE PYELOGRAM, URETEROSCOPY AND STENT PLACEMENT  09/09/2016   Procedure: CYSTOSCOPY WITH RIGHT  RETROGRADE PYELOGRAM, URETEROSCOPY WITH LASER BASKET EXTRACTION OF STONES  AND STENT PLACEMENT;  Surgeon: Irine Seal, MD;  Location[facility];  Service: Urology;;   CYSTOSCOPY WITH URETEROSCOPY Left 04/11/2014   Procedure: CYSTOSCOPY WITH URETEROSCOPY/ STONE EXTRACTION;  Surgeon: Malka So, MD;  Location[facility];  Service: Urology;  Laterality: Left;   FINE NEEDLE ASPIRATION  09/06/2019   Procedure: FINE NEEDLE ASPIRATION;  Surgeon: Collene Gobble, MD;  Location: [facility] ENDOSCOPY;  Service: Pulmonary;;  left upper lobe - lung   HOLMIUM LASER APPLICATION Right 02/08/2992   Procedure: HOLMIUM LASER APPLICATION;  Surgeon: Malka So, MD;  Location: [facility];  Service: Urology;  Laterality: Right;   HOLMIUM LASER APPLICATION Left 71/69/6789   Procedure: HOLMIUM LASER APPLICATION;  Surgeon: Malka So, MD;  Location: [facility];  Service: Urology;  Laterality: Left;   HOLMIUM LASER APPLICATION Right 3/81/0175   Procedure: HOLMIUM LASER APPLICATION;  Surgeon: Irine Seal, MD;  Location: [facility];  Service: Urology;  Laterality: Right;   INGUINAL HERNIA REPAIR Right 1954   INTERCOSTAL NERVE BLOCK Left 10/28/2019   Procedure: Intercostal Nerve Block;  Surgeon: [name], MD;  Location: [place];  Service: Thoracic;  Laterality: Left;   LITHOTRIPSY  2007 & 02/2013   LUNG BIOPSY  09/06/2019    Procedure: LUNG BIOPSY;  Surgeon: Collene Gobble, MD;  Location: [facility] ENDOSCOPY;  Service: Pulmonary;;  left upper lob lung   MANDIBLE FRACTURE SURGERY  1995   MICROLARYNGOSCOPY Right 02/18/2016   Procedure: MICRO DIRECT LARYNGOSCOPY EXCISIONAL BIOPSY OF RIGHT VOCAL CORD MASS;  Surgeon: Leta Baptist, MD;  Location: [place];  Service: ENT;  Laterality: Right;   [place]   left ear   MULTIPLE TOOTH [place]   NEPHROLITHOTOMY Left 01/27/2013   Procedure: NEPHROLITHOTOMY PERCUTANEOUS FIRST LOOK;  Surgeon: Malka So, MD;  Location: [facility];  Service: Urology;  Laterality: Left;   NODE DISSECTION  10/28/2019   Procedure: Node Dissection;  Surgeon: [name], MD;  Location: [place];  Service: Thoracic;;   PERIPHERAL VASCULAR CATHETERIZATION N/A 11/01/2014   Procedure: Lower Extremity Angiography;  Surgeon: Serafina Mitchell, MD;  Location: [place] [facility];  Service: Cardiovascular;  Laterality: N/A;   URETEROSCOPY Right 12/02/2012   Procedure: RIGHT URETEROSCOPY STONE EXTRACTION WITH  STENT PLACEMENT;  Surgeon: Malka So, MD;  Location: [place];  Service: Urology;  Laterality: Right;   VIDEO BRONCHOSCOPY  10/28/2019   VIDEO BRONCHOSCOPY WITH ENDOBRONCHIAL NAVIGATION WITH LUNG MARKINGS (N/A   VIDEO BRONCHOSCOPY WITH ENDOBRONCHIAL NAVIGATION N/A 09/06/2019   Procedure: VIDEO BRONCHOSCOPY WITH ENDOBRONCHIAL NAVIGATION;  Surgeon: Collene Gobble, MD;  Location: [place] ENDOSCOPY;  Service: Pulmonary;  Laterality: N/A;   VIDEO BRONCHOSCOPY WITH ENDOBRONCHIAL NAVIGATION N/A 10/28/2019   Procedure: VIDEO BRONCHOSCOPY WITH ENDOBRONCHIAL NAVIGATION WITH LUNG MARKINGS;  Surgeon: [name], MD;  Location: [facility];  Service: Thoracic;  Laterality: N/A;   WEDGE RESECTION  10/28/2019   LEFT UPPER LOBE     Current Outpatient Medications  Medication Sig Dispense Refill   acetaminophen (TYLENOL) 500 MG tablet Take 2 tablets (1,000 mg total) by mouth every 6  (six) hours as needed for mild pain. 30 tablet 0   albuterol (VENTOLIN HFA) 108 (90 Base) MCG/ACT inhaler Inhale 2 puffs into the lungs every 6 (six) hours as needed for wheezing or shortness of breath. 8 g 3   amLODipine (NORVASC) 10 MG tablet Take 10 mg by mouth daily.     aspirin EC 81 MG tablet Take 81 mg by mouth daily.     atorvastatin (LIPITOR) 40 MG tablet Take 40 mg by mouth daily.     cephALEXin (KEFLEX) 500 MG capsule Take 1 capsule (500 mg total) by mouth 4 (four) times daily for 7 days. 28 capsule 0   Cholecalciferol (VITAMIN D-3) 1000 units CAPS Take 1,000 Units by mouth daily.      ezetimibe (ZETIA) 10 MG tablet Take 5 mg by mouth daily.     finasteride (PROSCAR) 5 MG tablet TAKE 1 TABLET BY MOUTH  DAILY 90 tablet 3   gabapentin (NEURONTIN) 100 MG capsule Take 100-200 mg by mouth See admin instructions. Take 200 in the morning, 100 mg in the afternoon and bedtime     metFORMIN (GLUCOPHAGE-XR) 500 MG 24 hr tablet Take 1,000 mg by mouth 2 (two) times daily. Morning and evening     Multiple Vitamins-Minerals (MULTIVITAMIN WITH MINERALS) tablet Take 1 tablet by mouth daily.     mupirocin ointment (BACTROBAN) 2 % Apply 1 application topically daily. With dressing changes. 22 g 0   olmesartan (BENICAR) 20 MG tablet Take 20 mg by mouth daily.     olmesartan (BENICAR) 40 MG tablet Take 40 mg by mouth daily.     oxybutynin (DITROPAN) 5 MG tablet Take 5 mg by mouth every morning.     potassium citrate (UROCIT-K) 10 MEQ (1080 MG) SR tablet Take 10 mEq by mouth 3 (three) times daily with meals.     Tiotropium Bromide-Olodaterol (STIOLTO RESPIMAT) 2.5-2.5 MCG/ACT AERS Inhale 2 puffs into the lungs daily. 12 g 3   traMADol (ULTRAM) 50 MG tablet Take 1 tablet (50 mg total) by mouth every 6 (six) hours as needed (mild pain). 20 tablet 0   No current facility-administered medications for this visit.    Allergies:   Gabapentin, Morphine and related, Tape, Allevyn adhesive [wound dressings],  Amoxicillin, and Naproxen    ROS:  Please see the history of present illness.   Otherwise, review of systems are positive for ***.   All other systems are reviewed and negative.    PHYSICAL EXAM: VS:  There were no vitals taken for this visit. , BMI There is no height or weight on file to calculate BMI. GENERAL:  Well appearing NECK:  No jugular venous distention, waveform within normal limits,  carotid upstroke brisk and symmetric, no bruits, no thyromegaly LUNGS:  Clear to auscultation bilaterally CHEST:  Unremarkable HEART:  PMI not displaced or sustained,S1 and S2 within normal limits, no S3, no S4, no clicks, no rubs, *** murmurs ABD:  Flat, positive bowel sounds normal in frequency in pitch, no bruits, no rebound, no guarding, no midline pulsatile mass, no hepatomegaly, no splenomegaly EXT:  2 plus pulses throughout, no edema, no cyanosis no clubbing    ***GENERAL:  Well appearing NECK:  No jugular venous distention, waveform within normal limits, carotid upstroke brisk and symmetric, no bruits, no thyromegaly LUNGS: Decreased breath sounds bilaterally with few expiratory rhonchi CHEST:  Unremarkable HEART:  PMI not displaced or sustained,S1 and S2 within normal limits, no S3, no S4, no clicks, no rubs, no murmurs ABD:  Flat, positive bowel sounds normal in frequency in pitch, no bruits, no rebound, no guarding, no midline pulsatile mass, no hepatomegaly, no splenomegaly EXT:  2 plus pulses throughout, no edema, no cyanosis no clubbing   EKG:  EKG is *** ordered today. The ekg ordered today demonstrates sinus rhythm, rate ***, axis within normal limits, intervals within normal limits, RSR prime V1 V2, low voltage chest leads.  Ventricular ectopy in a bigeminal pattern   Recent Labs: 02/05/2021: Magnesium 1.6 08/19/2021: ALT 6; BUN 13; Creatinine 1.00; Hemoglobin 14.7; Platelet Count 319; Potassium 4.1; Sodium 138    Lipid Panel    Component Value Date/Time   TRIG 137  01/02/2020 0459      Wt Readings from Last 3 Encounters:  08/26/21 153 lb 12.8 oz (69.8 kg)  08/19/21 156 lb 5 oz (70.9 kg)  07/29/21 162 lb (73.5 kg)      Other studies Reviewed: Additional studies/ records that were reviewed today include: *** Review of the above records demonstrates:  Please see elsewhere in the note.     ASSESSMENT AND PLAN:  PVD:   *** He has had no symptoms related to this.  He actually has good pulses.  No change in therapy.   HTN: Blood pressure is *** controlled.  No change in therapy.   PVCS:  *** He is not noticing these.  No change in therapy.  DYSLIPIDEMIA: LDL recently was ***52 with an HDL of 43.  He will continue the meds as listed.   TOBACCO ABUSE:   *** He was unable to quit smoking.   DM: A1c is *** 6.1.  No change in therapy.   LUNG CANCER:  ***    Current medicines are reviewed at length with the patient today.  The patient does not have concerns regarding medicines.  The following changes have been made:  no change  Labs/ tests ordered today include:   No orders of the defined types were placed in this encounter.    Disposition:   FU with me in ***.       Signed, Minus Breeding, MD  08/29/2021 7:29 PM    [facility]

## 2021-08-30 ENCOUNTER — Ambulatory Visit: Payer: Medicare Other | Admitting: Cardiology

## 2021-09-04 ENCOUNTER — Other Ambulatory Visit: Payer: Self-pay | Admitting: *Deleted

## 2021-09-04 DIAGNOSIS — I10 Essential (primary) hypertension: Secondary | ICD-10-CM

## 2021-09-04 NOTE — Patient Outreach (Signed)
[facility]) Care Management ? ?09/04/2021 ? ?Gwen Her Heyne ?[birth year] ?[id number] ? ?[facility]) Care Management ?RN Health Coach Note ? ? ?09/04/2021 ?Name:  Chad Gomez MRN:  [id number] DOB:  [birth year] ? ?Summary: ?Patient reports that the Pleurx catheter has been taken out. He explains that presently he is breathing well, the Stiolto inhaler continues to be effective and he uses the rescue inhaler 2-3 times weekly. Patient states he is currently thinking about having chemotherapy. He first feels that he needs to address his hernia which has been painful recently and adds he has an appointment with a surgeon on 09/12/21 to evaluate. Nurse reviewed symptoms of hernia concerning symptoms with the patient and through questioning discovered that the patient has not had a bowel movement in several weeks per patient. Patient reports that his abdomen is very distended, denies nausea or vomiting, and explains that he has a poor appetite and back pain. Nurse advised the patient to call PCP to inform him of this issue today. Explaining that going several weeks without having a BM can become serious and his PCP needs to know and will prescribe what they feel is safe for the patient to do to treat the constipation. Patient stated he would call as soon as this conversation ended. Patient has various transportation services he uses: [facility], [employer], community, [facility] and states that often the service will cancel or not show up. Nurse discussed [facility] [place] and will send a referral to community care guide to assist with the application. Patient did not have any further questions or concerns today and did confirm that he has this nurse's contact number to call her if needed.  ? ?Recommendations/Changes made from today's visit: ?Patient Goals/Self-Care Activities: ?Continue to contact pulmonology and oncology as needed to discuss future treatments and for questions and concerns ?Drink Glucerna as a  nutritional supplement when you have a decrease appetite; I have included coupons ?Call Dr. Shelia Media to discuss constipation and medication options ?  ?Subjective: ?Chad Gomez is an 76 y.o. year old male who is a primary patient of [name], MD. The care management team was consulted for assistance with care management and/or care coordination needs.   ? ?RN Health Coach completed Telephone Visit today.  ? ?Objective: ? ?Medications Reviewed Today   ? ? Reviewed by Michiel Cowboy, RN (Registered Nurse) on 09/04/21 at [place] List Status: <None>  ? ?Medication Order Taking? Sig Documenting Provider Last Dose Status Informant  ?acetaminophen (TYLENOL) 500 MG tablet [id number] Yes Take 2 tablets (1,000 mg total) by mouth every 6 (six) hours as needed for mild pain. Barrett, Lodema Hong, PA-C Taking Active Self  ?albuterol (VENTOLIN HFA) 108 (90 Base) MCG/ACT inhaler [id number] Yes Inhale 2 puffs into the lungs every 6 (six) hours as needed for wheezing or shortness of breath. Margaretha Seeds, MD Taking Active Self  ?amLODipine (NORVASC) 10 MG tablet [id number] Yes Take 10 mg by mouth daily. [provider] Taking Active Self  ?aspirin EC 81 MG tablet [id number] Yes Take 81 mg by mouth daily. [provider] Taking Active Self  ?atorvastatin (LIPITOR) 40 MG tablet [id number] Yes Take 40 mg by mouth daily. [provider] Taking Active Self  ?Cholecalciferol (VITAMIN D-3) 1000 units CAPS [id number] Yes Take 1,000 Units by mouth daily.  [provider] Taking Active Self  ?ezetimibe (ZETIA) 10 MG tablet [id number] Yes Take 5 mg by mouth daily. [provider] Taking Active Self  ?         ?  Med Note Nadara Eaton May 22, 2021 11:46 AM)    ?finasteride (PROSCAR) 5 MG tablet [id number] Yes TAKE 1 TABLET BY MOUTH  DAILY Irine Seal, MD Taking Active Self  ?gabapentin (NEURONTIN) 100 MG capsule [id number] Yes Take 100-200 mg by mouth See admin instructions. Take 200 in the  morning, 100 mg in the afternoon and bedtime [provider] Taking Active Self  ?metFORMIN (GLUCOPHAGE-XR) 500 MG 24 hr tablet [id number] Yes Take 1,000 mg by mouth 2 (two) times daily. Morning and evening [provider] Taking Active Self  ?Multiple Vitamins-Minerals (MULTIVITAMIN WITH MINERALS) tablet [id number] Yes Take 1 tablet by mouth daily. [provider] Taking Active Self  ?mupirocin ointment (BACTROBAN) 2 % [id number] Yes Apply 1 application topically daily. With dressing changes. Clayton Bibles, NP Taking Active   ?olmesartan (BENICAR) 20 MG tablet [id number] Yes Take 20 mg by mouth daily. [provider] Taking Active   ?olmesartan (BENICAR) 40 MG tablet [id number] Yes Take 40 mg by mouth daily. [provider] Taking Active Self  ?oxybutynin (DITROPAN) 5 MG tablet [id number] Yes Take 5 mg by mouth every morning. [provider] Taking Active   ?potassium citrate (UROCIT-K) 10 MEQ (1080 MG) SR tablet [id number] Yes Take 10 mEq by mouth 3 (three) times daily with meals. [provider] Taking Active Self  ?Tiotropium Bromide-Olodaterol (STIOLTO RESPIMAT) 2.5-2.5 MCG/ACT AERS [id number] Yes Inhale 2 puffs into the lungs daily. Margaretha Seeds, MD Taking Active Self  ?traMADol Veatrice Bourbon) 50 MG tablet [id number] Yes Take 1 tablet (50 mg total) by mouth every 6 (six) hours as needed (mild pain). Swayze, Ava, DO Taking Active Self  ?         ?Med Note Nadara Eaton May 22, 2021 11:50 AM)    ? ?  ?  ? ?  ? ? ? ?SDOH:  (Social Determinants of Health) assessments and interventions performed:  ?SDOH Interventions   ? ?Flowsheet Row Most Recent Value  ?SDOH Interventions   ?Transportation Interventions --  [patient has various sevices he uses like [facility], [employer], [place] and states that often they service will cancel or not show up. Nurse discussed [facility] and will send a referral to community care guide to assist with the application.]   ? ?  ? ? ?Care Plan ? ?Review of patient past medical history, allergies, medications, health status, including review of consultants reports, laboratory and other test data, was performed as part of comprehensive evaluation for care management services.  ? ?Care Plan : RN Care Manager Plan of care  ?Updates made by Michiel Cowboy, RN since 09/04/2021 12:00 AM  ?  ? ?Problem: Knowledge Deficit Related to COPD   ?Priority: High  ?  ? ?Long-Range Goal: Development plan of care for management of COPD and Lung Cancer   ?Start Date: 04/25/2021  ?Expected End Date: 05/22/2022  ?Priority: High  ?Note:   ?Current Barriers:  ?Chronic Disease Management support and education needs related to COPD and Lung cancer ? ?RNCM Clinical Goal(s):  ?Patient will verbalize understanding of plan for management of COPD and Lung cancer as evidenced by using rescue medication as needed; continue to contact providers as needed to assist with management of COPD and Lung cancer; discuss with oncology lung cancer treatment options ?continue to work with RN Care Manager to address care management and care coordination needs related to  COPD and Lung cancer as evidenced by adherence to CM Team Scheduled appointments ?  collaborate with the care management team towards completion of advanced directives documents as evidenced by patient's verbalizations he has completed the documents.  ?Collaborate with the community care guide to assist you with applying for [facility] [place] Transportation ? ?Interventions: ?Inter-disciplinary care team collaboration (see longitudinal plan of care) ?Evaluation of current treatment plan related to  self management and patient's adherence to plan as established by provider ? ? ?COPD Interventions:  (Status:  Goal on track:  Yes.) Long Term Goal ?Provided patient with basic written and verbal COPD education on self care/management/and exacerbation prevention ?Advised patient to track and manage COPD triggers ?Provided  instruction about proper use of medications used for management of COPD including inhalers ?Advised patient to self assesses COPD action plan zone and make appointment with provider if in the yellow zone for 48 h

## 2021-09-04 NOTE — Patient Instructions (Signed)
Visit Information ? ?Thank you for taking time to visit with me today. Please don't hesitate to contact me if I can be of assistance to you before our next scheduled telephone appointment. ? ?Following are the goals we discussed today:  ?Patient Goals/Self-Care Activities: ?Take all medications as prescribed ?Attend all scheduled provider appointments ?Call provider office for new concerns or questions  ?identify and remove indoor air pollutants ?limit outdoor activity during cold weather ?listen for public air quality announcements every day ?follow rescue plan if symptoms flare-up ?Continue to contact pulmonology and oncology as needed to discuss future treatments and for questions and concerns ?Drink Glucerna as a nutritional supplement when you have a decrease appetite; I have included coupons ?Call Dr. Shelia Media to discuss constipation and medication options ? ?The patient verbalized understanding of instructions, educational materials, and care plan provided today and agreed to receive a mailed copy of patient instructions, educational materials, and care plan.  ? ?Telephone follow up appointment with care management team member scheduled for: April ? ?[name] RN, BSN ?Nurse Health Coach ?[place] ?[phone] ?[email or web address] ? ? ?  ?

## 2021-09-05 ENCOUNTER — Telehealth: Payer: Self-pay

## 2021-09-05 NOTE — Telephone Encounter (Signed)
? ?  Telephone encounter was:  Successful.  ?09/05/2021 ?Name: Chad Gomez MRN: [id number] DOB: [birth year] ? ?Chad Gomez is a 76 y.o. year old male who is a primary care patient of [name], MD . The community resource team was consulted for assistance with Transportation Needs  ? ?Care guide performed the following interventions: Patient provided with information about care guide support team and interviewed to confirm resource needs.Patient stated he did need applications for other transportation such as [place] [facility] I will send others in the mail as well and follow up in a few weeks  ? ?Follow Up Plan:  Care guide will follow up with patient by phone over the next two weeks ? ? ? ?Larena Sox ?Care Guide, Embedded Care Coordination ?[place], Care Management  ?[phone] ?[address], [place], [place] [address] ?Phone: [phone] ?Email: [email or web address] ? ?  ?

## 2021-09-12 DIAGNOSIS — Z720 Tobacco use: Secondary | ICD-10-CM | POA: Diagnosis not present

## 2021-09-12 DIAGNOSIS — C3410 Malignant neoplasm of upper lobe, unspecified bronchus or lung: Secondary | ICD-10-CM | POA: Diagnosis not present

## 2021-09-12 DIAGNOSIS — J439 Emphysema, unspecified: Secondary | ICD-10-CM | POA: Diagnosis not present

## 2021-09-17 DIAGNOSIS — I10 Essential (primary) hypertension: Secondary | ICD-10-CM | POA: Diagnosis not present

## 2021-09-19 ENCOUNTER — Telehealth: Payer: Self-pay | Admitting: Pulmonary Disease

## 2021-09-19 NOTE — Telephone Encounter (Signed)
Request for Pre-op evaluation by [facility] for incisional hernia repair with general anesthesia. Please fax note to [place], [place] ? ?Peri-operative Assessment of Pulmonary Risk for Non-Thoracic Surgery: ? ?For Mr. Denis, risk of perioperative pulmonary complications is increased by: ? Age greater than 57 years ? COPD (well-controlled) ? Smoking ?  ?Respiratory complications generally occur in 1% of ASA Class I patients, 5% of ASA Class II and 10% of ASA Class III-IV patients These complications rarely result in mortality and include postoperative pneumonia, atelectasis, pulmonary embolism, ARDS and increased time requiring postoperative mechanical ventilation. ? ?ARISCAT score: Low to medium risk  ?Up to 13% risk of post-op pulmonary complications.  ? ?Overall, I recommend proceeding with the surgery if the risk for respiratory complications are outweighed by the potential benefits. This will need to be discussed between the patient and surgeon. ? ?To reduce risks of respiratory complications, I recommend: ?--Smoking cessation 6-8 weeks if possible ?--Pre- and post-operative incentive spirometry performed frequently while awake ?--Avoiding use of pancuronium during anesthesia. ? ?Rodman Pickle, M.D. ?[facility] ?09/19/2021 6:19 PM  ? ? ? ? ? ?

## 2021-09-23 ENCOUNTER — Other Ambulatory Visit: Payer: Self-pay | Admitting: *Deleted

## 2021-09-23 NOTE — Patient Outreach (Signed)
[facility]) Care Management ? ?09/23/2021 ? ?Chad Gomez ?Oct 07, 1945 ?[id number] ? ?Unsuccessful outreach attempt made to patient. RN Health Coach left HIPAA compliant voicemail message along with her contact information. ? ?Plan: ?RN Health Coach will call patient within the month of April. ? ?[name] RN, BSN ?[facility] Care Management  ?RN Health Coach ?[phone] ?[email or web address] ? ? ?

## 2021-09-25 NOTE — Patient Instructions (Addendum)
DUE TO COVID-19 ONLY ONE VISITOR  (aged 75 and older)  IS ALLOWED TO COME WITH YOU AND STAY IN THE WAITING ROOM ONLY DURING PRE OP AND PROCEDURE.   ?**NO VISITORS ARE ALLOWED IN THE SHORT STAY AREA OR RECOVERY ROOM!!** ? ?IF YOU WILL BE ADMITTED INTO THE HOSPITAL YOU ARE ALLOWED ONLY TWO SUPPORT PEOPLE DURING VISITATION HOURS ONLY (7 AM -8PM)   ?The support person(s) must pass our screening, gel in and out, and wear a mask at all times, including in the patient?s room. ?Patients must also wear a mask when staff or their support person are in the room. ?Visitors GUEST BADGE MUST BE WORN VISIBLY  ?One adult visitor may remain with you overnight and MUST be in the room by 8 P.M. ?  ? ? Your procedure is scheduled on: 10/01/21 ? ? Report to [facility] Main Entrance ? ?  Report to Short Stay at : 5:15 AM ? ? Call this number if you have problems the morning of surgery [phone] ? ? Do not eat food :After Midnight. ? ? After Midnight you may have the following liquids until : 4:30 AM DAY OF SURGERY ? ?Water ?Black Coffee (sugar ok, NO MILK/CREAM OR CREAMERS)  ?Tea (sugar ok, NO MILK/CREAM OR CREAMERS) regular and decaf                             ?Plain Jell-O (NO RED)                                           ?Fruit ices (not with fruit pulp, NO RED)                                     ?Popsicles (NO RED)                                                                  ?Juice: apple, WHITE grape, WHITE cranberry ?Sports drinks like Gatorade (NO RED) ?Clear broth(vegetable,chicken,beef) ?  ?NO SOLID FOOD AFTER MIDNIGHT THE NIGHT PRIOR TO SURGERY. NOTHING BY MOUTH EXCEPT CLEAR LIQUIDS UNTIL: 4:30 AM . PLEASE FINISH ENSURE DRINK PER SURGEON ORDER  WHICH NEEDS TO BE COMPLETED AT : 4:30 AM.  ? ?Oral Hygiene is also important to reduce your risk of infection.                                    ?Remember - BRUSH YOUR TEETH THE MORNING OF SURGERY WITH YOUR REGULAR TOOTHPASTE ? ? Do NOT smoke after Midnight ? ? Take  these medicines the morning of surgery with A SIP OF WATER: gabapentin,amlodipine,finasteride,oxybutynin. ? ?How to Manage Your Diabetes ?Before and After Surgery ? ?Why is it important to control my blood sugar before and after surgery? ?Improving blood sugar levels before and after surgery helps healing and can limit problems. ?A way of improving blood sugar control is eating a healthy diet by: ? Eating less  sugar and carbohydrates ? Increasing activity/exercise ? Talking with your doctor about reaching your blood sugar goals ?High blood sugars (greater than 180 mg/dL) can raise your risk of infections and slow your recovery, so you will need to focus on controlling your diabetes during the weeks before surgery. ?Make sure that the doctor who takes care of your diabetes knows about your planned surgery including the date and location. ? ?How do I manage my blood sugar before surgery? ?Check your blood sugar at least 4 times a day, starting 2 days before surgery, to make sure that the level is not too high or low. ?Check your blood sugar the morning of your surgery when you wake up and every 2 hours until you get to the Short Stay unit. ?If your blood sugar is less than 70 mg/dL, you will need to treat for low blood sugar: ?Do not take insulin. ?Treat a low blood sugar (less than 70 mg/dL) with ? cup of clear juice (cranberry or apple), 4 glucose tablets, OR glucose gel. ?Recheck blood sugar in 15 minutes after treatment (to make sure it is greater than 70 mg/dL). If your blood sugar is not greater than 70 mg/dL on recheck, call [phone] for further instructions. ?Report your blood sugar to the short stay nurse when you get to Short Stay. ? ?If you are admitted to the hospital after surgery: ?Your blood sugar will be checked by the staff and you will probably be given insulin after surgery (instead of oral diabetes medicines) to make sure you have good blood sugar levels. ?The goal for blood sugar control after  surgery is 80-180 mg/dL. ? ? ?WHAT DO I DO ABOUT MY DIABETES MEDICATION? ? ?Do not take oral diabetes medicines (pills) the morning of surgery. ? ?THE DAY BEFORE SURGERY, take metformin as usual.     ? ?THE MORNING OF SURGERY, DO NOT TAKE ANY ORAL DIABETIC MEDICATIONS DAY OF YOUR SURGERY ? ?Bring CPAP mask and tubing day of surgery. ?                  ?           You may not have any metal on your body including hair pins, jewelry, and body piercing ? ?           Do not wear lotions, powders, perfumes/cologne, or deodorant ? ?            Men may shave face and neck. ? ? Do not bring valuables to the hospital. [facility] NOT ?            RESPONSIBLE   FOR VALUABLES. ? ? Contacts, dentures or bridgework may not be worn into surgery. ? ? Bring small overnight bag day of surgery. ?  ? Patients discharged on the day of surgery will not be allowed to drive home.  Someone NEEDS to stay with you for the first 24 hours after anesthesia. ? ? Special Instructions: Bring a copy of your healthcare power of attorney and living will documents         the day of surgery if you haven't scanned them before. ? ?            Please read over the following fact sheets you were given: IF [place] [phone] ? ?   [facility] - Preparing for Surgery ?Before surgery, you can play an important role.  Because skin is not sterile,  your skin needs to be as free of germs as possible.  You can reduce the number of germs on your skin by washing with CHG (chlorahexidine gluconate) soap before surgery.  CHG is an antiseptic cleaner which kills germs and bonds with the skin to continue killing germs even after washing. ?Please DO NOT use if you have an allergy to CHG or antibacterial soaps.  If your skin becomes reddened/irritated stop using the CHG and inform your nurse when you arrive at Short Stay. ?Do not shave (including legs and underarms) for at least 48 hours prior to the first CHG  shower.  You may shave your face/neck. ?Please follow these instructions carefully: ? 1.  Shower with CHG Soap the night before surgery and the  morning of Surgery. ? 2.  If you choose to wash your hair, wash your hair first as usual with your  normal  shampoo. ? 3.  After you shampoo, rinse your hair and body thoroughly to remove the  shampoo.                           4.  Use CHG as you would any other liquid soap.  You can apply chg directly  to the skin and wash  ?                     Gently with a scrungie or clean washcloth. ? 5.  Apply the CHG Soap to your body ONLY FROM THE NECK DOWN.   Do not use on face/ open      ?                     Wound or open sores. Avoid contact with eyes, ears mouth and genitals (private parts).  ?                     Production manager,  Genitals (private parts) with your normal soap. ?            6.  Wash thoroughly, paying special attention to the area where your surgery  will be performed. ? 7.  Thoroughly rinse your body with warm water from the neck down. ? 8.  DO NOT shower/wash with your normal soap after using and rinsing off  the CHG Soap. ?               9.  Pat yourself dry with a clean towel. ?           10.  Wear clean pajamas. ?           11.  Place clean sheets on your bed the night of your first shower and do not  sleep with pets. ?Day of Surgery : ?Do not apply any lotions/deodorants the morning of surgery.  Please wear clean clothes to the hospital/surgery center. ? ?FAILURE TO FOLLOW THESE INSTRUCTIONS MAY RESULT IN THE CANCELLATION OF YOUR SURGERY ?PATIENT SIGNATURE_________________________________ ? ?NURSE SIGNATURE__________________________________ ? ?________________________________________________________________________  ?

## 2021-09-25 NOTE — Progress Notes (Signed)
Pt. Needs orders for upcoming surgery. ?

## 2021-09-26 ENCOUNTER — Other Ambulatory Visit: Payer: Self-pay

## 2021-09-26 ENCOUNTER — Other Ambulatory Visit: Payer: Self-pay | Admitting: *Deleted

## 2021-09-26 ENCOUNTER — Ambulatory Visit: Payer: Self-pay | Admitting: General Surgery

## 2021-09-26 ENCOUNTER — Encounter (HOSPITAL_COMMUNITY): Payer: Self-pay

## 2021-09-26 ENCOUNTER — Encounter (HOSPITAL_COMMUNITY)
Admission: RE | Admit: 2021-09-26 | Discharge: 2021-09-26 | Disposition: A | Discharge status: Home / Self Care | Payer: Medicare Other | Source: Ambulatory Visit | Attending: General Surgery | Admitting: General Surgery

## 2021-09-26 VITALS — BP 110/93 | HR 68 | Temp 97.9°F | Resp 18 | Ht 70.0 in | Wt 145.0 lb

## 2021-09-26 DIAGNOSIS — I10 Essential (primary) hypertension: Secondary | ICD-10-CM | POA: Diagnosis not present

## 2021-09-26 DIAGNOSIS — E119 Type 2 diabetes mellitus without complications: Secondary | ICD-10-CM | POA: Diagnosis not present

## 2021-09-26 DIAGNOSIS — Z01818 Encounter for other preprocedural examination: Secondary | ICD-10-CM | POA: Insufficient documentation

## 2021-09-26 DIAGNOSIS — E118 Type 2 diabetes mellitus with unspecified complications: Secondary | ICD-10-CM

## 2021-09-26 LAB — CBC
HCT: 48.9 % (ref 39.0–52.0)
Hemoglobin: 15.6 g/dL (ref 13.0–17.0)
MCH: 29.5 pg (ref 26.0–34.0)
MCHC: 31.9 g/dL (ref 30.0–36.0)
MCV: 92.4 fL (ref 80.0–100.0)
Platelets: 325 10*3/uL (ref 150–400)
RBC: 5.29 MIL/uL (ref 4.22–5.81)
RDW: 14.7 % (ref 11.5–15.5)
WBC: 11 10*3/uL — ABNORMAL HIGH (ref 4.0–10.5)
nRBC: 0 % (ref 0.0–0.2)

## 2021-09-26 LAB — BASIC METABOLIC PANEL
Anion gap: 10 (ref 5–15)
BUN: 21 mg/dL (ref 8–23)
CO2: 24 mmol/L (ref 22–32)
Calcium: 10.5 mg/dL — ABNORMAL HIGH (ref 8.9–10.3)
Chloride: 109 mmol/L (ref 98–111)
Creatinine, Ser: 1.08 mg/dL (ref 0.61–1.24)
GFR, Estimated: >60 mL/min (ref >60)
Glucose, Bld: 124 mg/dL — ABNORMAL HIGH (ref 70–99)
Potassium: 4.8 mmol/L (ref 3.5–5.1)
Sodium: 143 mmol/L (ref 135–145)

## 2021-09-26 LAB — HEMOGLOBIN A1C
Hgb A1c MFr Bld: 5.8 % — ABNORMAL HIGH (ref 4.8–5.6)
Mean Plasma Glucose: 119.76 mg/dL

## 2021-09-26 LAB — GLUCOSE, CAPILLARY: Glucose-Capillary: 118 mg/dL — ABNORMAL HIGH (ref 70–99)

## 2021-09-26 NOTE — Progress Notes (Signed)
For Short Stay: ?[place] appointment date: ?Date of COVID positive in last 90 days: ?COVID Vaccine: NO ?Bowel Prep reminder: ? ? ?For Anesthesia: ?PCP - Dr. [name] ?Cardiologist - Dr. Minus Breeding. ? ?Chest x-ray - 07/29/21 ?EKG -  ?Stress Test -  ?ECHO -  ?Cardiac Cath -  ?Pacemaker/ICD device last checked: ?Pacemaker orders received: ?Device Rep notified: ? ?Spinal Cord Stimulator: ? ?Sleep Study -  ?CPAP -  ? ?Fasting Blood Sugar -  ?Checks Blood Sugar _____ times a day ?Date and result of last Hgb A1c- ? ?Blood Thinner Instructions: ?Aspirin Instructions: ?Last Dose: ? ?Activity level: Can go up a flight of stairs and activities of daily living without stopping and without chest pain and/or shortness of breath ?  Able to exercise without chest pain and/or shortness of breath ?  Unable to go up a flight of stairs without chest pain and/or shortness of breath ?   ? ?Anesthesia review: Hx: HTN,DIA,COPD,PVD,Smoker ? ?Patient denies shortness of breath, fever, cough and chest pain at PAT appointment ? ? ?Patient verbalized understanding of instructions that were given to them at the PAT appointment. Patient was also instructed that they will need to review over the PAT instructions again at home before surgery.  ?

## 2021-09-26 NOTE — Patient Outreach (Signed)
[facility]) Care Management ? ?09/26/2021 ? ?Gwen Her Maxton ?Mar 19, 1946 ?[id number] ? ?Unsuccessful outreach attempt made to patient. RN Health Coach left HIPAA compliant voicemail message along with her contact information. ? ?Plan: ?RN Health Coach will call patient within the month of April. ? ?[name] RN, BSN ?[facility] Care Management  ?RN Health Coach ?[phone] ?[email or web address] ? ? ?

## 2021-09-30 NOTE — Progress Notes (Signed)
Anesthesia Chart Review: ? ? Case: [id number] Date/Time: 10/01/21 0715  ? Procedure: INCISIONAL HERNIA REPAIR WITH MESH  ? Anesthesia type: General  ? Pre-op diagnosis: INCISIONAL HERNIA  ? Location: [facility]OM 04 / [facility]  ? Surgeons: Kinsinger, Arta Bruce, MD  ? ?  ? ? ?DISCUSSION: ?Pt is 76 years old with hx HTN, DM, PAD (s/p aorto-bifem BG 2016), COPD, recurrent and metastatic lung cancer (s/p LUL wedge resection), CKD ? ?VS: BP (!) 110/93   Pulse 68   Temp 36.6 ?C (Oral)   Resp 18   Ht 5\' 10"  (1.778 m)   Wt 65.8 kg   SpO2 93%   BMI 20.81 kg/m?  ? ?PROVIDERS: ?- PCP is [name], MD ?- Oncologist is Curt Bears, MD ?- Cardiologist is Minus Breeding, MD who sees pt for PVCs and HTN. Last office visit 08/17/20 ? ?- Pulmonologist is Chi Rodman Pickle, MD who cleared pt for surgery at low to medium risk on 09/19/21. She notes:  ?"To reduce risks of respiratory complications, I recommend: ?--Smoking cessation 6-8 weeks if possible ?--Pre- and post-operative incentive spirometry performed frequently while awake ?--Avoiding use of pancuronium during anesthesia." ? ? ? ?LABS: Labs reviewed: Acceptable for surgery. ?(all labs ordered are listed, but only abnormal results are displayed) ? ?Labs Reviewed  ?BASIC METABOLIC PANEL - Abnormal; Notable for the following components:  ?    Result Value  ? Glucose, Bld 124 (*)   ? Calcium 10.5 (*)   ? All other components within normal limits  ?CBC - Abnormal; Notable for the following components:  ? WBC 11.0 (*)   ? All other components within normal limits  ?HEMOGLOBIN A1C - Abnormal; Notable for the following components:  ? Hgb A1c MFr Bld 5.8 (*)   ? All other components within normal limits  ?GLUCOSE, CAPILLARY - Abnormal; Notable for the following components:  ? Glucose-Capillary 118 (*)   ? All other components within normal limits  ? ? ? ?IMAGES: ?CXR 07/29/21:  ?- COPD changes with increased atelectasis versus consolidation in LEFT ?lower lobe. ?- Persistent LEFT  pleural effusion. ?- Persistent LEFT midlung nodule. ? ?Super D chest CT 05/23/22:  ?1. Redemonstrated pleural thickening and nodularity, a previously PET avid nodule of the posterior medial left lower lobe pleura unchanged, however with extensive new somewhat nodular soft tissue thickening about the medial left upper lobe. ?2. Soft tissue nodularity about the wedge resection suture line in the central left upper lobe is unchanged, measuring 2.4 x 1.6 cm, however previously PET avid.  ?3. Significant increase in size of a nodule in the peripheral anterior left upper lobe, now measuring 2.8 x 1.4 cm, previously 0.8 x 0.6 cm and PET avid. ?4. Moderate, loculated malignant left pleural effusion is significantly increased in volume compared to prior examination. ?5. Constellation of findings is consistent with worsened recurrent left lung malignancy and associated pleural metastatic disease. ?6. Severe centrilobular emphysema and diffuse bilateral bronchial wall thickening. ?7. Coronary artery disease. ? ? ?EKG 09/26/21: Sinus tachycardia (108 bpm) with frequent PVCs ? ? ?CV: ?Nuclear stress test 10/21/19:  ?There was no ST segment deviation noted during stress. ?Nuclear stress EF: 75%. ?The left ventricular ejection fraction is hyperdynamic (>65%). ?The study is normal. ?This is a low risk study ? ? ?Past Medical History:  ?Diagnosis Date  ? BPH (benign prostatic hypertrophy)   ? weak stream  ? Chronic kidney disease   ? Complication of anesthesia   ? urinary retention  ?  COPD (chronic obstructive pulmonary disease) ([place])   ? Diabetes mellitus without complication ([place])   ? Type II  ? History of bladder stone   ? History of colon polyps   ? 08/2013   pre-canerous  ? History of kidney stones   ? History of melanoma excision   ? LEFT EAR  ? Hyperlipidemia   ? Hypertension   ? Iron deficiency   ? Lung cancer, lingula ([place]) 11/01/2019  ? Open wound   ? left lower jaw; followed by ENT  ? Peripheral vascular disease ([place])   ?  Right ureteral stone   ? SBO (small bowel obstruction) ([place]) 12/26/2019  ? Shingles 05-15-2015  ? ? ?Past Surgical History:  ?Procedure Laterality Date  ? ABDOMINAL AORTAGRAM  11/01/2014  ? Procedure: Abdominal Aortagram;  Surgeon: Serafina Mitchell, MD;  Location: [facility];  Service: Cardiovascular;;  ? AORTA - BILATERAL FEMORAL ARTERY BYPASS GRAFT N/A 12/28/2014  ? Procedure: AORTOBIFEMORAL BYPASS GRAFT;  Surgeon: Serafina Mitchell, MD;  Location: [facility] OR;  Service: Vascular;  Laterality: N/A;  ? BRONCHIAL BRUSHINGS  09/06/2019  ? Procedure: BRONCHIAL BRUSHINGS;  Surgeon: Collene Gobble, MD;  Location: [facility] ENDOSCOPY;  Service: Pulmonary;;  left upper lobe nodule  ? BRONCHIAL WASHINGS  09/06/2019  ? Procedure: BRONCHIAL WASHINGS;  Surgeon: Collene Gobble, MD;  Location: [facility] ENDOSCOPY;  Service: Pulmonary;;  ? CHEST TUBE INSERTION N/A 06/20/2021  ? Procedure: INSERTION PLEURAL DRAINAGE CATHETER;  Surgeon: Margaretha Seeds, MD;  Location: [facility];  Service: Cardiopulmonary;  Laterality: N/A;  ? COLONOSCOPY W/ POLYPECTOMY  09-13-2013  ? CYSTOLITHALOPAXY OF BLADDER STONE/ TRANSRECTAL ULTRASOUND PROSTATE BX  08-19-2005  ? CYSTOSCOPY W/ RETROGRADES Right 12/02/2012  ? Procedure: CYSTOSCOPY WITH RETROGRADE PYELOGRAM;  Surgeon: Malka So, MD;  Location: [facility];  Service: Urology;  Laterality: Right;  ? CYSTOSCOPY W/ RETROGRADES Bilateral 10/13/2013  ? Procedure: CYSTOSCOPY WITH RETROGRADE PYELOGRAM;  Surgeon: Irine Seal, MD;  Location: [facility];  Service: Urology;  Laterality: Bilateral;  ? CYSTOSCOPY W/ URETERAL STENT PLACEMENT Left 04/11/2014  ? Procedure: CYSTOSCOPY WITH STENT REPLACEMENT;  Surgeon: Malka So, MD;  Location: [facility];  Service: Urology;  Laterality: Left;  ? CYSTOSCOPY WITH LITHOLAPAXY Right 12/02/2012  ? Procedure: CYSTOSCOPY WITH stone extraction;  Surgeon: Malka So, MD;  Location: [facility];  Service: Urology;   Laterality: Right;  ? CYSTOSCOPY WITH LITHOLAPAXY Right 10/13/2013  ? Procedure: CYSTOSCOPY WITH LITHOLAPAXY, removal of urethral stone with basket;  Surgeon: Irine Seal, MD;  Location: [facility];  Service: Urology;  Laterality: Right;  ? CYSTOSCOPY WITH RETROGRADE PYELOGRAM, URETEROSCOPY AND STENT PLACEMENT Left 03/21/2014  ? Procedure: CYSTOSCOPY WITH RETROGRADE PYELOGRAM, AND LEFT STENT PLACEMENT;  Surgeon: Alexis Frock, MD;  [facility];  Service: Urology;  Laterality: Left;  ? CYSTOSCOPY WITH RETROGRADE PYELOGRAM, URETEROSCOPY AND STENT PLACEMENT  09/09/2016  ? Procedure: CYSTOSCOPY WITH RIGHT  RETROGRADE PYELOGRAM, URETEROSCOPY WITH LASER BASKET EXTRACTION OF STONES  AND STENT PLACEMENT;  Surgeon: Irine Seal, MD;  Location: [facility];  Service: Urology;;  ? CYSTOSCOPY WITH URETEROSCOPY Left 04/11/2014  ? Procedure: CYSTOSCOPY WITH URETEROSCOPY/ STONE EXTRACTION;  Surgeon: Malka So, MD;  Location: [facility];  Service: Urology;  Laterality: Left;  ? FINE NEEDLE ASPIRATION  09/06/2019  ? Procedure: FINE NEEDLE ASPIRATION;  Surgeon: Collene Gobble, MD;  Location: [facility] ENDOSCOPY;  Service: Pulmonary;;  left upper lobe -  lung  ? HOLMIUM LASER APPLICATION Right 9/79/8921  ? Procedure: HOLMIUM LASER APPLICATION;  Surgeon: Malka So, MD;  Location: [facility];  Service: Urology;  Laterality: Right;  ? HOLMIUM LASER APPLICATION Left 19/41/7408  ? Procedure: HOLMIUM LASER APPLICATION;  Surgeon: Malka So, MD;  Location: [facility];  Service: Urology;  Laterality: Left;  ? HOLMIUM LASER APPLICATION Right 1/44/8185  ? Procedure: HOLMIUM LASER APPLICATION;  Surgeon: Irine Seal, MD;  Location: [facility];  Service: Urology;  Laterality: Right;  ? INGUINAL HERNIA REPAIR Right 1954  ? INTERCOSTAL NERVE BLOCK Left 10/28/2019  ? Procedure: Intercostal Nerve Block;  Surgeon: [name], MD;  Location: [facility];   Service: Thoracic;  Laterality: Left;  ? LITHOTRIPSY  2007 & 02/2013  ? LUNG BIOPSY  09/06/2019  ? Procedure: LUNG BIOPSY;  Surgeon: Collene Gobble, MD;  Location: [facility] ENDOSCOPY;  Service: Pulmonary;;  left upper lob lung  ? MA

## 2021-09-30 NOTE — Anesthesia Preprocedure Evaluation (Addendum)
Anesthesia Evaluation  ?Patient identified by MRN, date of birth, ID band ?Patient awake ? ? ? ?Reviewed: ?Allergy & Precautions, H&P , NPO status , Patient's Chart, lab work & pertinent test results ? ?History of Anesthesia Complications ?(+) history of anesthetic complications ? ?Airway ?Mallampati: I ? ?TM Distance: >3 FB ?Neck ROM: Full ? ? ? Dental ?no notable dental hx. ?(+) Edentulous Upper, Edentulous Lower ?  ?Pulmonary ?neg pulmonary ROS, COPD,  COPD inhaler, Current Smoker and Patient abstained from smoking.,  ?  ?Pulmonary exam normal ?breath sounds clear to auscultation ? ? ? ? ? ? Cardiovascular ?Exercise Tolerance: Good ?hypertension, + Peripheral Vascular Disease  ?negative cardio ROS ?Normal cardiovascular exam ?Rhythm:Regular Rate:Normal ? ?EKG 09/26/21: Sinus tachycardia (108 bpm) with frequent PVCs ?? ?Nuclear stress test 10/21/19:  ?There was no ST segment deviation noted during stress. ?Nuclear stress EF: 75%. ?The left ventricular ejection fraction is hyperdynamic (>65%). ?The study is normal. ?This is a low risk study ?  ?Neuro/Psych ? Neuromuscular disease negative neurological ROS ? negative psych ROS  ? GI/Hepatic ?negative GI ROS, Neg liver ROS,   ?Endo/Other  ?negative endocrine ROSdiabetes, Type 2 ? Renal/GU ?negative Renal ROS  ?negative genitourinary ?  ?Musculoskeletal ?negative musculoskeletal ROS ?(+)  ? Abdominal ?  ?Peds ?negative pediatric ROS ?(+)  Hematology ?negative hematology ROS ?(+)   ?Anesthesia Other Findings ? ? Reproductive/Obstetrics ?negative OB ROS ? ?  ? ? ? ? ? ? ? ? ? ? ? ? ? ?  ?  ? ? ? ? ? ?Anesthesia Physical ?Anesthesia Plan ? ?ASA: 4 ? ?Anesthesia Plan: General  ? ?Post-op Pain Management: Minimal or no pain anticipated and Tylenol PO (pre-op)*  ? ?Induction: Intravenous ? ?PONV Risk Score and Plan: 1 and Ondansetron ? ?Airway Management Planned: Oral ETT ? ?Additional Equipment: None ? ?Intra-op Plan:  ? ?Post-operative Plan:  Extubation in OR ? ?Informed Consent: I have reviewed the patients History and Physical, chart, labs and discussed the procedure including the risks, benefits and alternatives for the proposed anesthesia with the patient or authorized representative who has indicated his/her understanding and acceptance.  ? ? ? ? ? ?Plan Discussed with: Anesthesiologist and CRNA ? ?Anesthesia Plan Comments: (See APP note by Durel Salts, FNP  ?Pt is 76 years old with hx HTN, DM, PAD (s/p aorto-bifem BG 2016), COPD, recurrent and metastatic lung cancer (s/p LUL wedge resection), CKD ?? ?VS: BP (!) 110/93   Pulse 68   Temp 36.6 ?C (Oral)   Resp 18   Ht 5\' 10"  (1.778 m)   Wt 65.8 kg   SpO2 93%   BMI 20.81 kg/m?  ?? ?- Pulmonologist is Chi Rodman Pickle, MD who cleared pt for surgery at low to medium risk on 09/19/21. She notes:  ?"To reduce risks?of respiratory complications, I recommend: ?--Smoking cessation 6-8 weeks if possible ?--Pre- and post-operative incentive spirometry performed frequently while awake ?--Avoiding use of pancuronium during anesthesia." ?? ?)  ? ? ? ?Anesthesia Quick Evaluation ? ?

## 2021-10-01 ENCOUNTER — Ambulatory Visit (HOSPITAL_COMMUNITY): Payer: Medicare Other | Admitting: Emergency Medicine

## 2021-10-01 ENCOUNTER — Other Ambulatory Visit: Payer: Self-pay

## 2021-10-01 ENCOUNTER — Ambulatory Visit (HOSPITAL_BASED_OUTPATIENT_CLINIC_OR_DEPARTMENT_OTHER): Payer: Medicare Other | Admitting: Anesthesiology

## 2021-10-01 ENCOUNTER — Encounter (HOSPITAL_COMMUNITY)
Admission: RE | Disposition: A | Discharge status: Skilled Nursing Facility | Payer: Self-pay | Source: Home / Self Care | Attending: General Surgery

## 2021-10-01 ENCOUNTER — Encounter (HOSPITAL_COMMUNITY): Payer: Self-pay | Admitting: General Surgery

## 2021-10-01 ENCOUNTER — Inpatient Hospital Stay (HOSPITAL_COMMUNITY)
Admission: RE | Admit: 2021-10-01 | Discharge: 2021-10-07 | DRG: 353 | Disposition: A | Discharge status: Skilled Nursing Facility | Payer: Medicare Other | Attending: General Surgery | Admitting: General Surgery

## 2021-10-01 DIAGNOSIS — J910 Malignant pleural effusion: Secondary | ICD-10-CM | POA: Diagnosis not present

## 2021-10-01 DIAGNOSIS — Z8249 Family history of ischemic heart disease and other diseases of the circulatory system: Secondary | ICD-10-CM

## 2021-10-01 DIAGNOSIS — Z7984 Long term (current) use of oral hypoglycemic drugs: Secondary | ICD-10-CM

## 2021-10-01 DIAGNOSIS — F32A Depression, unspecified: Secondary | ICD-10-CM | POA: Diagnosis not present

## 2021-10-01 DIAGNOSIS — I10 Essential (primary) hypertension: Secondary | ICD-10-CM | POA: Diagnosis not present

## 2021-10-01 DIAGNOSIS — Z85118 Personal history of other malignant neoplasm of bronchus and lung: Secondary | ICD-10-CM | POA: Diagnosis not present

## 2021-10-01 DIAGNOSIS — J449 Chronic obstructive pulmonary disease, unspecified: Secondary | ICD-10-CM

## 2021-10-01 DIAGNOSIS — Z885 Allergy status to narcotic agent status: Secondary | ICD-10-CM | POA: Diagnosis not present

## 2021-10-01 DIAGNOSIS — J439 Emphysema, unspecified: Secondary | ICD-10-CM | POA: Diagnosis not present

## 2021-10-01 DIAGNOSIS — Z751 Person awaiting admission to adequate facility elsewhere: Secondary | ICD-10-CM

## 2021-10-01 DIAGNOSIS — N400 Enlarged prostate without lower urinary tract symptoms: Secondary | ICD-10-CM | POA: Diagnosis present

## 2021-10-01 DIAGNOSIS — Z9221 Personal history of antineoplastic chemotherapy: Secondary | ICD-10-CM | POA: Diagnosis not present

## 2021-10-01 DIAGNOSIS — N139 Obstructive and reflux uropathy, unspecified: Secondary | ICD-10-CM | POA: Diagnosis not present

## 2021-10-01 DIAGNOSIS — Z888 Allergy status to other drugs, medicaments and biological substances status: Secondary | ICD-10-CM | POA: Diagnosis not present

## 2021-10-01 DIAGNOSIS — R0902 Hypoxemia: Secondary | ICD-10-CM | POA: Diagnosis not present

## 2021-10-01 DIAGNOSIS — I739 Peripheral vascular disease, unspecified: Secondary | ICD-10-CM | POA: Diagnosis not present

## 2021-10-01 DIAGNOSIS — K432 Incisional hernia without obstruction or gangrene: Secondary | ICD-10-CM

## 2021-10-01 DIAGNOSIS — E559 Vitamin D deficiency, unspecified: Secondary | ICD-10-CM | POA: Diagnosis not present

## 2021-10-01 DIAGNOSIS — R54 Age-related physical debility: Secondary | ICD-10-CM | POA: Diagnosis not present

## 2021-10-01 DIAGNOSIS — Z1159 Encounter for screening for other viral diseases: Secondary | ICD-10-CM | POA: Diagnosis not present

## 2021-10-01 DIAGNOSIS — Z66 Do not resuscitate: Secondary | ICD-10-CM | POA: Diagnosis not present

## 2021-10-01 DIAGNOSIS — R2689 Other abnormalities of gait and mobility: Secondary | ICD-10-CM | POA: Diagnosis not present

## 2021-10-01 DIAGNOSIS — J9601 Acute respiratory failure with hypoxia: Secondary | ICD-10-CM | POA: Diagnosis not present

## 2021-10-01 DIAGNOSIS — Z818 Family history of other mental and behavioral disorders: Secondary | ICD-10-CM | POA: Diagnosis not present

## 2021-10-01 DIAGNOSIS — Z48815 Encounter for surgical aftercare following surgery on the digestive system: Secondary | ICD-10-CM | POA: Diagnosis not present

## 2021-10-01 DIAGNOSIS — J309 Allergic rhinitis, unspecified: Secondary | ICD-10-CM | POA: Diagnosis not present

## 2021-10-01 DIAGNOSIS — Z7189 Other specified counseling: Secondary | ICD-10-CM | POA: Diagnosis not present

## 2021-10-01 DIAGNOSIS — Z79899 Other long term (current) drug therapy: Secondary | ICD-10-CM | POA: Diagnosis not present

## 2021-10-01 DIAGNOSIS — Z515 Encounter for palliative care: Secondary | ICD-10-CM | POA: Diagnosis not present

## 2021-10-01 DIAGNOSIS — E1151 Type 2 diabetes mellitus with diabetic peripheral angiopathy without gangrene: Secondary | ICD-10-CM | POA: Diagnosis not present

## 2021-10-01 DIAGNOSIS — G629 Polyneuropathy, unspecified: Secondary | ICD-10-CM | POA: Diagnosis not present

## 2021-10-01 DIAGNOSIS — Z7982 Long term (current) use of aspirin: Secondary | ICD-10-CM | POA: Diagnosis not present

## 2021-10-01 DIAGNOSIS — Z79890 Hormone replacement therapy (postmenopausal): Secondary | ICD-10-CM | POA: Diagnosis not present

## 2021-10-01 DIAGNOSIS — F17200 Nicotine dependence, unspecified, uncomplicated: Secondary | ICD-10-CM | POA: Diagnosis not present

## 2021-10-01 DIAGNOSIS — E118 Type 2 diabetes mellitus with unspecified complications: Secondary | ICD-10-CM | POA: Diagnosis not present

## 2021-10-01 DIAGNOSIS — Z823 Family history of stroke: Secondary | ICD-10-CM | POA: Diagnosis not present

## 2021-10-01 DIAGNOSIS — J9811 Atelectasis: Secondary | ICD-10-CM | POA: Diagnosis not present

## 2021-10-01 DIAGNOSIS — J432 Centrilobular emphysema: Secondary | ICD-10-CM | POA: Diagnosis present

## 2021-10-01 DIAGNOSIS — E785 Hyperlipidemia, unspecified: Secondary | ICD-10-CM | POA: Diagnosis present

## 2021-10-01 DIAGNOSIS — E43 Unspecified severe protein-calorie malnutrition: Secondary | ICD-10-CM | POA: Diagnosis not present

## 2021-10-01 DIAGNOSIS — I70213 Atherosclerosis of native arteries of extremities with intermittent claudication, bilateral legs: Secondary | ICD-10-CM | POA: Diagnosis not present

## 2021-10-01 DIAGNOSIS — Z743 Need for continuous supervision: Secondary | ICD-10-CM | POA: Diagnosis not present

## 2021-10-01 DIAGNOSIS — R0602 Shortness of breath: Secondary | ICD-10-CM | POA: Diagnosis not present

## 2021-10-01 DIAGNOSIS — F17210 Nicotine dependence, cigarettes, uncomplicated: Secondary | ICD-10-CM | POA: Diagnosis not present

## 2021-10-01 DIAGNOSIS — Z8719 Personal history of other diseases of the digestive system: Secondary | ICD-10-CM

## 2021-10-01 DIAGNOSIS — Z86010 Personal history of colonic polyps: Secondary | ICD-10-CM

## 2021-10-01 DIAGNOSIS — Z87442 Personal history of urinary calculi: Secondary | ICD-10-CM

## 2021-10-01 DIAGNOSIS — Z7401 Bed confinement status: Secondary | ICD-10-CM | POA: Diagnosis not present

## 2021-10-01 DIAGNOSIS — C3492 Malignant neoplasm of unspecified part of left bronchus or lung: Secondary | ICD-10-CM | POA: Diagnosis not present

## 2021-10-01 DIAGNOSIS — R6889 Other general symptoms and signs: Secondary | ICD-10-CM | POA: Diagnosis not present

## 2021-10-01 DIAGNOSIS — R531 Weakness: Secondary | ICD-10-CM | POA: Diagnosis not present

## 2021-10-01 DIAGNOSIS — J90 Pleural effusion, not elsewhere classified: Secondary | ICD-10-CM | POA: Diagnosis not present

## 2021-10-01 DIAGNOSIS — M6281 Muscle weakness (generalized): Secondary | ICD-10-CM | POA: Diagnosis not present

## 2021-10-01 DIAGNOSIS — I700 Atherosclerosis of aorta: Secondary | ICD-10-CM | POA: Diagnosis not present

## 2021-10-01 HISTORY — PX: INCISIONAL HERNIA REPAIR: SHX193

## 2021-10-01 LAB — CBC
HCT: 44.9 % (ref 39.0–52.0)
Hemoglobin: 14.2 g/dL (ref 13.0–17.0)
MCH: 29.2 pg (ref 26.0–34.0)
MCHC: 31.6 g/dL (ref 30.0–36.0)
MCV: 92.4 fL (ref 80.0–100.0)
Platelets: 275 10*3/uL (ref 150–400)
RBC: 4.86 MIL/uL (ref 4.22–5.81)
RDW: 14.3 % (ref 11.5–15.5)
WBC: 12.5 10*3/uL — ABNORMAL HIGH (ref 4.0–10.5)
nRBC: 0 % (ref 0.0–0.2)

## 2021-10-01 LAB — CREATININE, SERUM
Creatinine, Ser: 0.92 mg/dL (ref 0.61–1.24)
GFR, Estimated: >60 mL/min (ref >60)

## 2021-10-01 LAB — GLUCOSE, CAPILLARY
Glucose-Capillary: 117 mg/dL — ABNORMAL HIGH (ref 70–99)
Glucose-Capillary: 129 mg/dL — ABNORMAL HIGH (ref 70–99)
Glucose-Capillary: 127 mg/dL — ABNORMAL HIGH (ref 70–99)
Glucose-Capillary: 135 mg/dL — ABNORMAL HIGH (ref 70–99)

## 2021-10-01 SURGERY — REPAIR, HERNIA, INCISIONAL
Anesthesia: General

## 2021-10-01 MED ORDER — BUPIVACAINE-EPINEPHRINE (PF) 0.25% -1:200000 IJ SOLN
INTRAMUSCULAR | Status: AC
Start: 2021-10-01 — End: ?
  Filled 2021-10-01: qty 30

## 2021-10-01 MED ORDER — PROPOFOL 10 MG/ML IV BOLUS
INTRAVENOUS | Status: DC | PRN
  Administered 2021-10-01: 100 mg via INTRAVENOUS

## 2021-10-01 MED ORDER — CEFAZOLIN SODIUM-DEXTROSE 2-4 GM/100ML-% IV SOLN
2.0000 g | INTRAVENOUS | Status: AC
  Administered 2021-10-01: 2 g via INTRAVENOUS
  Filled 2021-10-01: qty 100

## 2021-10-01 MED ORDER — LIDOCAINE HCL 2 % IJ SOLN
INTRAMUSCULAR | Status: AC
  Filled 2021-10-01: qty 20

## 2021-10-01 MED ORDER — ENSURE PRE-SURGERY PO LIQD: 296.0000 mL | Freq: Once | ORAL | Status: DC

## 2021-10-01 MED ORDER — TRAMADOL HCL 50 MG PO TABS
50.0000 mg | ORAL_TABLET | Freq: Four times a day (QID) | ORAL | Status: DC | PRN
  Administered 2021-10-02 – 2021-10-03 (×3): 50 mg via ORAL
  Filled 2021-10-01 (×3): qty 1

## 2021-10-01 MED ORDER — ALBUTEROL SULFATE HFA 108 (90 BASE) MCG/ACT IN AERS
INHALATION_SPRAY | RESPIRATORY_TRACT | Status: DC | PRN
  Administered 2021-10-01: 5 via RESPIRATORY_TRACT

## 2021-10-01 MED ORDER — ROCURONIUM BROMIDE 100 MG/10ML IV SOLN
INTRAVENOUS | Status: DC | PRN
  Administered 2021-10-01: 60 mg via INTRAVENOUS
  Administered 2021-10-01: 5 mg via INTRAVENOUS
  Administered 2021-10-01: 20 mg via INTRAVENOUS

## 2021-10-01 MED ORDER — BUPIVACAINE LIPOSOME 1.3 % IJ SUSP
INTRAMUSCULAR | Status: DC | PRN
Start: 2021-10-01 — End: 2021-10-01
  Administered 2021-10-01: 20 mL

## 2021-10-01 MED ORDER — IRBESARTAN 75 MG PO TABS
37.5000 mg | ORAL_TABLET | Freq: Every day | ORAL | Status: DC
Start: 2021-10-01 — End: 2021-10-07
  Administered 2021-10-01 – 2021-10-07 (×7): 37.5 mg via ORAL
  Filled 2021-10-01 (×7): qty 1

## 2021-10-01 MED ORDER — FENTANYL CITRATE (PF) 250 MCG/5ML IJ SOLN
INTRAMUSCULAR | Status: AC
  Filled 2021-10-01: qty 5

## 2021-10-01 MED ORDER — ALBUMIN HUMAN 5 % IV SOLN
INTRAVENOUS | Status: AC
  Filled 2021-10-01: qty 250

## 2021-10-01 MED ORDER — GABAPENTIN 100 MG PO CAPS
200.0000 mg | ORAL_CAPSULE | Freq: Every day | ORAL | Status: DC
  Administered 2021-10-02 – 2021-10-07 (×6): 200 mg via ORAL
  Filled 2021-10-01 (×6): qty 2

## 2021-10-01 MED ORDER — MIDAZOLAM HCL 2 MG/2ML IJ SOLN
INTRAMUSCULAR | Status: AC
  Filled 2021-10-01: qty 2

## 2021-10-01 MED ORDER — ARFORMOTEROL TARTRATE 15 MCG/2ML IN NEBU
15.0000 ug | INHALATION_SOLUTION | Freq: Two times a day (BID) | RESPIRATORY_TRACT | Status: DC
  Administered 2021-10-01 – 2021-10-07 (×12): 15 ug via RESPIRATORY_TRACT
  Filled 2021-10-01 (×12): qty 2

## 2021-10-01 MED ORDER — ATORVASTATIN CALCIUM 40 MG PO TABS
40.0000 mg | ORAL_TABLET | Freq: Every day | ORAL | Status: DC
  Administered 2021-10-01 – 2021-10-07 (×7): 40 mg via ORAL
  Filled 2021-10-01 (×7): qty 1

## 2021-10-01 MED ORDER — MIDAZOLAM HCL 5 MG/5ML IJ SOLN
INTRAMUSCULAR | Status: DC | PRN
  Administered 2021-10-01 (×2): .5 mg via INTRAVENOUS

## 2021-10-01 MED ORDER — ACETAMINOPHEN 500 MG PO TABS: 1000.0000 mg | ORAL_TABLET | Freq: Once | ORAL | Status: DC

## 2021-10-01 MED ORDER — PHENYLEPHRINE 40 MCG/ML (10ML) SYRINGE FOR IV PUSH (FOR BLOOD PRESSURE SUPPORT)
PREFILLED_SYRINGE | INTRAVENOUS | Status: AC
  Filled 2021-10-01: qty 10

## 2021-10-01 MED ORDER — SUGAMMADEX SODIUM 200 MG/2ML IV SOLN
INTRAVENOUS | Status: DC | PRN
  Administered 2021-10-01: 200 mg via INTRAVENOUS

## 2021-10-01 MED ORDER — DEXAMETHASONE SODIUM PHOSPHATE 10 MG/ML IJ SOLN
INTRAMUSCULAR | Status: AC
  Filled 2021-10-01: qty 1

## 2021-10-01 MED ORDER — MUPIROCIN 2 % EX OINT
1.0000 "application " | TOPICAL_OINTMENT | Freq: Every day | CUTANEOUS | Status: DC
  Administered 2021-10-02 – 2021-10-07 (×5): 1 via TOPICAL
  Filled 2021-10-01 (×2): qty 22

## 2021-10-01 MED ORDER — ALBUTEROL SULFATE (2.5 MG/3ML) 0.083% IN NEBU: 2.5000 mg | INHALATION_SOLUTION | Freq: Four times a day (QID) | RESPIRATORY_TRACT | Status: DC | PRN

## 2021-10-01 MED ORDER — FENTANYL CITRATE (PF) 100 MCG/2ML IJ SOLN
INTRAMUSCULAR | Status: DC | PRN
  Administered 2021-10-01 (×7): 25 ug via INTRAVENOUS
  Administered 2021-10-01: 75 ug via INTRAVENOUS

## 2021-10-01 MED ORDER — OXYCODONE HCL 5 MG PO TABS: 5.0000 mg | ORAL_TABLET | Freq: Once | ORAL | Status: DC | PRN

## 2021-10-01 MED ORDER — FLUTICASONE PROPIONATE 50 MCG/ACT NA SUSP
1.0000 | Freq: Every day | NASAL | Status: DC | PRN
  Filled 2021-10-01: qty 16

## 2021-10-01 MED ORDER — DIPHENHYDRAMINE HCL 50 MG/ML IJ SOLN: 12.5000 mg | Freq: Four times a day (QID) | INTRAMUSCULAR | Status: DC | PRN

## 2021-10-01 MED ORDER — DIPHENHYDRAMINE HCL 12.5 MG/5ML PO ELIX: 12.5000 mg | ORAL_SOLUTION | Freq: Four times a day (QID) | ORAL | Status: DC | PRN

## 2021-10-01 MED ORDER — GABAPENTIN 100 MG PO CAPS: 100.0000 mg | ORAL_CAPSULE | ORAL | Status: DC

## 2021-10-01 MED ORDER — VITAMIN D 25 MCG (1000 UNIT) PO TABS
1000.0000 [IU] | ORAL_TABLET | Freq: Every day | ORAL | Status: DC
  Administered 2021-10-01 – 2021-10-07 (×7): 1000 [IU] via ORAL
  Filled 2021-10-01 (×7): qty 1

## 2021-10-01 MED ORDER — CHLORHEXIDINE GLUCONATE 0.12 % MT SOLN: 15.0000 mL | Freq: Once | OROMUCOSAL | Status: AC

## 2021-10-01 MED ORDER — POLYETHYLENE GLYCOL 3350 17 G PO PACK: 17.0000 g | PACK | Freq: Every day | ORAL | Status: DC | PRN

## 2021-10-01 MED ORDER — ONDANSETRON HCL 4 MG/2ML IJ SOLN
INTRAMUSCULAR | Status: AC
  Filled 2021-10-01: qty 2

## 2021-10-01 MED ORDER — OXYCODONE HCL 5 MG/5ML PO SOLN: 5.0000 mg | Freq: Once | ORAL | Status: DC | PRN

## 2021-10-01 MED ORDER — HYDROCODONE-ACETAMINOPHEN 5-325 MG PO TABS
1.0000 | ORAL_TABLET | ORAL | Status: DC | PRN
  Administered 2021-10-02 – 2021-10-03 (×2): 1 via ORAL
  Filled 2021-10-01 (×3): qty 1

## 2021-10-01 MED ORDER — METOPROLOL TARTRATE 5 MG/5ML IV SOLN: 5.0000 mg | Freq: Four times a day (QID) | INTRAVENOUS | Status: DC | PRN

## 2021-10-01 MED ORDER — METFORMIN HCL ER 500 MG PO TB24
1000.0000 mg | ORAL_TABLET | Freq: Two times a day (BID) | ORAL | Status: DC
  Administered 2021-10-02 – 2021-10-07 (×10): 1000 mg via ORAL
  Filled 2021-10-01 (×10): qty 2

## 2021-10-01 MED ORDER — ALBUMIN HUMAN 5 % IV SOLN: INTRAVENOUS | Status: DC | PRN

## 2021-10-01 MED ORDER — FENTANYL CITRATE PF 50 MCG/ML IJ SOSY
25.0000 ug | PREFILLED_SYRINGE | INTRAMUSCULAR | Status: DC | PRN
  Administered 2021-10-01: 25 ug via INTRAVENOUS

## 2021-10-01 MED ORDER — AMLODIPINE BESYLATE 10 MG PO TABS
10.0000 mg | ORAL_TABLET | Freq: Every day | ORAL | Status: DC
  Administered 2021-10-02 – 2021-10-07 (×6): 10 mg via ORAL
  Filled 2021-10-01 (×6): qty 1

## 2021-10-01 MED ORDER — LIDOCAINE HCL (PF) 2 % IJ SOLN
INTRAMUSCULAR | Status: AC
  Filled 2021-10-01: qty 5

## 2021-10-01 MED ORDER — ACETAMINOPHEN 500 MG PO TABS
1000.0000 mg | ORAL_TABLET | Freq: Four times a day (QID) | ORAL | Status: DC
  Administered 2021-10-01 – 2021-10-07 (×18): 1000 mg via ORAL
  Filled 2021-10-01 (×21): qty 2

## 2021-10-01 MED ORDER — ASPIRIN EC 81 MG PO TBEC
81.0000 mg | DELAYED_RELEASE_TABLET | Freq: Every day | ORAL | Status: DC
  Administered 2021-10-01 – 2021-10-07 (×7): 81 mg via ORAL
  Filled 2021-10-01 (×7): qty 1

## 2021-10-01 MED ORDER — ONDANSETRON HCL 4 MG/2ML IJ SOLN
4.0000 mg | Freq: Four times a day (QID) | INTRAMUSCULAR | Status: DC | PRN
Start: 2021-10-01 — End: 2021-10-07

## 2021-10-01 MED ORDER — ROCURONIUM BROMIDE 10 MG/ML (PF) SYRINGE
PREFILLED_SYRINGE | INTRAVENOUS | Status: AC
  Filled 2021-10-01: qty 10

## 2021-10-01 MED ORDER — PHENYLEPHRINE HCL (PRESSORS) 10 MG/ML IV SOLN
INTRAVENOUS | Status: DC | PRN
  Administered 2021-10-01: 120 ug via INTRAVENOUS
  Administered 2021-10-01: 30 ug via INTRAVENOUS
  Administered 2021-10-01: 80 ug via INTRAVENOUS
  Administered 2021-10-01: 120 ug via INTRAVENOUS

## 2021-10-01 MED ORDER — PHENYLEPHRINE HCL-NACL 20-0.9 MG/250ML-% IV SOLN
INTRAVENOUS | Status: DC | PRN
  Administered 2021-10-01: 30 ug/min via INTRAVENOUS

## 2021-10-01 MED ORDER — ORAL CARE MOUTH RINSE
15.0000 mL | Freq: Once | OROMUCOSAL | Status: AC
  Administered 2021-10-01: 15 mL via OROMUCOSAL

## 2021-10-01 MED ORDER — BUPIVACAINE LIPOSOME 1.3 % IJ SUSP
INTRAMUSCULAR | Status: AC
  Filled 2021-10-01: qty 20

## 2021-10-01 MED ORDER — LACTATED RINGERS IV SOLN: INTRAVENOUS | Status: DC

## 2021-10-01 MED ORDER — POTASSIUM CITRATE ER 10 MEQ (1080 MG) PO TBCR
10.0000 meq | EXTENDED_RELEASE_TABLET | Freq: Three times a day (TID) | ORAL | Status: DC
  Administered 2021-10-02 – 2021-10-07 (×15): 10 meq via ORAL
  Filled 2021-10-01 (×19): qty 1

## 2021-10-01 MED ORDER — ACETAMINOPHEN 160 MG/5ML PO SOLN: 325.0000 mg | ORAL | Status: DC | PRN

## 2021-10-01 MED ORDER — UMECLIDINIUM BROMIDE 62.5 MCG/ACT IN AEPB
1.0000 | INHALATION_SPRAY | Freq: Every day | RESPIRATORY_TRACT | Status: DC
Start: 2021-10-01 — End: 2021-10-03
  Administered 2021-10-02 – 2021-10-03 (×2): 1 via RESPIRATORY_TRACT
  Filled 2021-10-01: qty 7

## 2021-10-01 MED ORDER — PHENYLEPHRINE HCL-NACL 20-0.9 MG/250ML-% IV SOLN
INTRAVENOUS | Status: AC
  Filled 2021-10-01: qty 250

## 2021-10-01 MED ORDER — FENTANYL CITRATE PF 50 MCG/ML IJ SOSY
PREFILLED_SYRINGE | INTRAMUSCULAR | Status: AC
  Filled 2021-10-01: qty 1

## 2021-10-01 MED ORDER — EZETIMIBE 10 MG PO TABS
5.0000 mg | ORAL_TABLET | Freq: Every day | ORAL | Status: DC
  Administered 2021-10-01 – 2021-10-07 (×7): 5 mg via ORAL
  Filled 2021-10-01 (×7): qty 1

## 2021-10-01 MED ORDER — ALBUTEROL SULFATE HFA 108 (90 BASE) MCG/ACT IN AERS
INHALATION_SPRAY | RESPIRATORY_TRACT | Status: AC
Start: 2021-10-01 — End: ?
  Filled 2021-10-01: qty 6.7

## 2021-10-01 MED ORDER — 0.9 % SODIUM CHLORIDE (POUR BTL) OPTIME
TOPICAL | Status: DC | PRN
  Administered 2021-10-01: 1000 mL

## 2021-10-01 MED ORDER — MEPERIDINE HCL 50 MG/ML IJ SOLN: 6.2500 mg | INTRAMUSCULAR | Status: DC | PRN

## 2021-10-01 MED ORDER — ACETAMINOPHEN 500 MG PO TABS
1000.0000 mg | ORAL_TABLET | ORAL | Status: DC
  Filled 2021-10-01: qty 2

## 2021-10-01 MED ORDER — CHLORHEXIDINE GLUCONATE CLOTH 2 % EX PADS: 6.0000 | MEDICATED_PAD | Freq: Once | CUTANEOUS | Status: DC

## 2021-10-01 MED ORDER — DEXAMETHASONE SODIUM PHOSPHATE 10 MG/ML IJ SOLN
INTRAMUSCULAR | Status: DC | PRN
  Administered 2021-10-01: 5 mg via INTRAVENOUS

## 2021-10-01 MED ORDER — GABAPENTIN 100 MG PO CAPS
100.0000 mg | ORAL_CAPSULE | Freq: Every day | ORAL | Status: DC
  Administered 2021-10-01 – 2021-10-06 (×6): 100 mg via ORAL
  Filled 2021-10-01 (×6): qty 1

## 2021-10-01 MED ORDER — HYDROMORPHONE HCL 1 MG/ML IJ SOLN: 0.2500 mg | INTRAMUSCULAR | Status: DC | PRN

## 2021-10-01 MED ORDER — BUPIVACAINE-EPINEPHRINE 0.25% -1:200000 IJ SOLN
INTRAMUSCULAR | Status: DC | PRN
  Administered 2021-10-01: 30 mL

## 2021-10-01 MED ORDER — ONDANSETRON 4 MG PO TBDP: 4.0000 mg | ORAL_TABLET | Freq: Four times a day (QID) | ORAL | Status: DC | PRN

## 2021-10-01 MED ORDER — ACETAMINOPHEN 325 MG PO TABS: 325.0000 mg | ORAL_TABLET | ORAL | Status: DC | PRN

## 2021-10-01 MED ORDER — BUPIVACAINE LIPOSOME 1.3 % IJ SUSP: 20.0000 mL | Freq: Once | INTRAMUSCULAR | Status: DC

## 2021-10-01 MED ORDER — ONDANSETRON HCL 4 MG/2ML IJ SOLN
INTRAMUSCULAR | Status: DC | PRN
  Administered 2021-10-01: 4 mg via INTRAVENOUS

## 2021-10-01 MED ORDER — LIDOCAINE HCL (CARDIAC) PF 100 MG/5ML IV SOSY
PREFILLED_SYRINGE | INTRAVENOUS | Status: DC | PRN
  Administered 2021-10-01: 50 mg via INTRAVENOUS

## 2021-10-01 MED ORDER — ONDANSETRON HCL 4 MG/2ML IJ SOLN: 4.0000 mg | Freq: Once | INTRAMUSCULAR | Status: DC | PRN

## 2021-10-01 MED ORDER — PROPOFOL 10 MG/ML IV BOLUS
INTRAVENOUS | Status: AC
  Filled 2021-10-01: qty 20

## 2021-10-01 MED ORDER — LIDOCAINE HCL (PF) 2 % IJ SOLN
INTRAMUSCULAR | Status: DC | PRN
  Administered 2021-10-01: 1.5 mg/kg/h via INTRADERMAL

## 2021-10-01 MED ORDER — FINASTERIDE 5 MG PO TABS
5.0000 mg | ORAL_TABLET | Freq: Every day | ORAL | Status: DC
  Administered 2021-10-02 – 2021-10-07 (×6): 5 mg via ORAL
  Filled 2021-10-01 (×6): qty 1

## 2021-10-01 MED ORDER — ENOXAPARIN SODIUM 40 MG/0.4ML IJ SOSY
40.0000 mg | PREFILLED_SYRINGE | INTRAMUSCULAR | Status: DC
  Administered 2021-10-02 – 2021-10-07 (×6): 40 mg via SUBCUTANEOUS
  Filled 2021-10-01 (×6): qty 0.4

## 2021-10-01 SURGICAL SUPPLY — 42 items
ADH SKN CLS APL DERMABOND .7 (GAUZE/BANDAGES/DRESSINGS) ×1
APL PRP STRL LF DISP 70% ISPRP (MISCELLANEOUS) ×1
APL SKNCLS NONHYPOALLERGENIC (GAUZE/BANDAGES/DRESSINGS)
APL SKNCLS STERI-STRIP NONHPOA (GAUZE/BANDAGES/DRESSINGS)
BAG COUNTER SPONGE SURGICOUNT (BAG) IMPLANT
BAG SPNG CNTER NS LX DISP (BAG)
BENZOIN TINCTURE PRP APPL 2/3 (GAUZE/BANDAGES/DRESSINGS) IMPLANT
CHLORAPREP W/TINT 26 (MISCELLANEOUS) ×2 IMPLANT
COVER SURGICAL LIGHT HANDLE (MISCELLANEOUS) ×2 IMPLANT
DERMABOND ADVANCED (GAUZE/BANDAGES/DRESSINGS) ×1
DERMABOND ADVANCED .7 DNX12 (GAUZE/BANDAGES/DRESSINGS) IMPLANT
DRAIN CHANNEL 19F RND (DRAIN) IMPLANT
DRAPE LAPAROSCOPIC ABDOMINAL (DRAPES) ×2 IMPLANT
DRSG TELFA 4X10 ISLAND STR (GAUZE/BANDAGES/DRESSINGS) IMPLANT
DRSG TELFA 4X8 ISLAND (GAUZE/BANDAGES/DRESSINGS) IMPLANT
ELECT REM PT RETURN 15FT ADLT (MISCELLANEOUS) ×2 IMPLANT
EVACUATOR SILICONE 100CC (DRAIN) IMPLANT
GLOVE BIOGEL PI IND STRL 7.0 (GLOVE) ×1 IMPLANT
GLOVE BIOGEL PI INDICATOR 7.0 (GLOVE) ×1
GLOVE SURG SS PI 7.0 STRL IVOR (GLOVE) ×2 IMPLANT
GOWN STRL REUS W/ TWL LRG LVL3 (GOWN DISPOSABLE) ×1 IMPLANT
GOWN STRL REUS W/ TWL XL LVL3 (GOWN DISPOSABLE) IMPLANT
GOWN STRL REUS W/TWL LRG LVL3 (GOWN DISPOSABLE) ×2
GOWN STRL REUS W/TWL XL LVL3 (GOWN DISPOSABLE)
KIT BASIN OR (CUSTOM PROCEDURE TRAY) ×2 IMPLANT
KIT TURNOVER KIT A (KITS) IMPLANT
MESH HERNIA 6X6 BARD (Mesh General) IMPLANT
MESH HERNIA BARD 6X6 (Mesh General) ×1 IMPLANT
NEEDLE HYPO 22GX1.5 SAFETY (NEEDLE) ×2 IMPLANT
PACK GENERAL/GYN (CUSTOM PROCEDURE TRAY) ×2 IMPLANT
RETAINER VISCERA MED (MISCELLANEOUS) ×1 IMPLANT
SPIKE FLUID TRANSFER (MISCELLANEOUS) ×2 IMPLANT
SPONGE DRAIN TRACH 4X4 STRL 2S (GAUZE/BANDAGES/DRESSINGS) IMPLANT
SPONGE T-LAP 18X18 ~~LOC~~+RFID (SPONGE) IMPLANT
STRIP CLOSURE SKIN 1/2X4 (GAUZE/BANDAGES/DRESSINGS) IMPLANT
SUT MNCRL AB 4-0 PS2 18 (SUTURE) ×2 IMPLANT
SUT NOVA 0 T19/GS 22DT (SUTURE) ×1 IMPLANT
SUT PDS AB 0 CT1 36 (SUTURE) ×6 IMPLANT
SUT VIC AB 3-0 SH 18 (SUTURE) ×2 IMPLANT
SYR 20ML LL LF (SYRINGE) ×2 IMPLANT
TOWEL OR 17X26 10 PK STRL BLUE (TOWEL DISPOSABLE) ×2 IMPLANT
TOWEL OR NON WOVEN STRL DISP B (DISPOSABLE) ×2 IMPLANT

## 2021-10-01 NOTE — H&P (Signed)
Chief Complaint: Hernia ? ? ?History of Present Illness: ?Chad Gomez is a 76 y.o. male who is seen today as an office consultation for evaluation of Hernia ?Marland Kitchen  ?He has had a hernia for multiple years. It has gotten bigger and now is very painful. He has lost 30 lb because he has no appetite. He denies vomiting or constipation. He smoke 1 pack per day. He is being treated for lung cancer and COPD by Dr. Julien Nordmann and Dr. Loanne Drilling. ? ?He had an inguinal hernia repaired when he was 76 years old. ? ?Review of Systems: ?A complete review of systems was obtained from the patient. I have reviewed this information and discussed as appropriate with the patient. See HPI as well for other ROS. ? ?Review of Systems  ?Constitutional: Positive for malaise/fatigue and weight loss.  ?HENT: Negative.  ?Eyes: Negative.  ?Respiratory: Negative.  ?Cardiovascular: Negative.  ?Gastrointestinal: Positive for abdominal pain.  ?Genitourinary: Negative.  ?Musculoskeletal: Positive for back pain.  ?Skin: Negative.  ?Neurological: Negative.  ?Endo/Heme/Allergies: Negative.  ?Psychiatric/Behavioral: Negative.  ? ? ?Medical History: ?Past Medical History:  ?Diagnosis Date  ? Aneurysm (CMS-HCC)  ? COPD (chronic obstructive pulmonary disease) (CMS-HCC)  ? History of cancer  ? Hyperlipidemia  ? ?There is no problem list on file for this patient. ? ?Past Surgical History:  ?Procedure Laterality Date  ? PAD surgery N/A  ? ? ?Allergies  ?Allergen Reactions  ? Gabapentin Other (See Comments)  ?"Slurred speech in high doses" ? ? Naproxen Swelling and Unknown  ?Left Eye ?Eyes swell ? ? Morphine Other (See Comments)  ? Wound Dressings Other (See Comments)  ?blisters  ? Amoxicillin Abdominal Pain and Other (See Comments)  ?Severe stomach pain ? ? ?Current Outpatient Medications on File Prior to Visit  ?Medication Sig Dispense Refill  ? acetaminophen (TYLENOL) 500 MG tablet Take by mouth  ? albuterol 90 mcg/actuation inhaler 2 puffs as needed  ?  finasteride (PROSCAR) 5 mg tablet 1 tablet  ? gabapentin (NEURONTIN) 100 MG capsule Take 2 capsules by mouth 3 (three) times daily  ? tiotropium-olodateroL (STIOLTO RESPIMAT) 2.5-2.5 mcg/actuation inhaler Inhale into the lungs  ? traMADoL (ULTRAM) 50 mg tablet (Schedule IV Drug) TAKE 1 TABLET BY MOUTH EVERY 6 HOURS AS NEEDED FOR MILD PAIN  ? aspirin 81 MG EC tablet 1 tablet  ? atorvastatin (LIPITOR) 40 MG tablet Take 1 tablet by mouth once daily  ? cholecalciferol (VITAMIN D3) 1000 unit capsule Take by mouth  ? ezetimibe (ZETIA) 10 mg tablet Take 0.5 tablets by mouth once daily  ? metFORMIN (GLUCOPHAGE-XR) 500 MG XR tablet TAKE 2 TABLETS BY MOUTH IN THE MORNING AND 1 TABLET BY MOUTH IN THE EVENING WITH FOOD AS DIRECTED  ? potassium citrate (UROCIT-K) 10 mEq ER tablet 1 tablet with meals  ? ?No current facility-administered medications on file prior to visit.  ? ?Family History  ?Problem Relation Age of Onset  ? High blood pressure (Hypertension) Sister  ? ? ?Social History  ? ?Tobacco Use  ?Smoking Status Never  ?Smokeless Tobacco Never  ? ? ?Social History  ? ?Socioeconomic History  ? Marital status: Married  ?Tobacco Use  ? Smoking status: Never  ? Smokeless tobacco: Never  ?Vaping Use  ? Vaping Use: Never used  ?Substance and Sexual Activity  ? Alcohol use: Never  ? Drug use: Never  ? ?Objective:  ? ?Vitals:  ?09/12/21 1402  ?BP: 110/70  ?Pulse: 96  ?Temp: 36.8 ?C (98.3 ?F)  ?SpO2: 91%  ?  Weight: 67.5 kg (148 lb 12.8 oz)  ?Height: 177.8 cm (5\' 10" )  ? ?Body mass index is 21.35 kg/m?. ? ?Physical Exam ?Constitutional:  ?Appearance: Normal appearance.  ?HENT:  ?Head: Normocephalic and atraumatic.  ?Pulmonary:  ?Effort: Pulmonary effort is normal.  ?Abdominal:  ?Comments: Moderate incisional hernia with 5 cm defect  ?Musculoskeletal:  ?General: Normal range of motion.  ?Cervical back: Normal range of motion.  ?Neurological:  ?General: No focal deficit present.  ?Mental Status: He is alert and oriented to person,  place, and time. Mental status is at baseline.  ?Psychiatric:  ?Mood and Affect: Mood normal.  ?Behavior: Behavior normal.  ?Thought Content: Thought content normal.  ? ? ? ?Labs, Imaging and Diagnostic Testing: ?I reviewed notes by Curt Bears and Chi Opal Sidles Ellison's notes ?I reviewed CT scan showing moderate incisional hernia containing small intestine ? ?Assessment and Plan:  ? ?Diagnoses and all orders for this visit: ? ?Tobacco abuse ? ?Lung cancer, lingula (CMS-HCC) ? ?Pulmonary emphysema, unspecified emphysema type (CMS-HCC) ? ?The patient has multiple severe medical problems and is losing weight which is unlikely to be solely related to the hernia. He continues to use tobacco products and was counseled on how this is negatively impacting his hernia and will negatively impact any hernia surgery. ? ?Decisions on symptomatic hernias in cancer patients who are smoking with lung disease are always difficult. We will reach out to Dr. Loanne Drilling for guidance on lung disease severity and likelihood of majority respiratory complication with general anesthesia. ? ?The patient has a symptomatic reducible hernia. We discussed the etiology of his hernia, the risk of it enlarging, incarceration, obstruction, strangulation, and that it is unlikely to get smaller or better on its own. We discussed operative options of laparoscopic vs open repair with mesh including the risks of recurrence, injury to intestines or abdominal organs, or chronic pain associated with mesh. Once stratification is complete, the procedural choice would be incisional hernia repair with mesh with at least observation stay.  ?  ?

## 2021-10-01 NOTE — Plan of Care (Signed)
?  Problem: Activity: ?Goal: Risk for activity intolerance will decrease ?Outcome: Progressing ?  ?Problem: Nutrition: ?Goal: Adequate nutrition will be maintained ?Outcome: Progressing ?  ?Problem: Elimination: ?Goal: Will not experience complications related to urinary retention ?Outcome: Progressing ?  ?Problem: Pain Managment: ?Goal: General experience of comfort will improve ?Outcome: Progressing ?  ?

## 2021-10-01 NOTE — Anesthesia Postprocedure Evaluation (Signed)
Anesthesia Post Note ? ?Patient: Chad Gomez ? ?Procedure(s) Performed: [place] ? ?  ? ?Patient location during evaluation: PACU ?Anesthesia Type: General ?Level of consciousness: awake and alert ?Pain management: pain level controlled ?Vital Signs Assessment: post-procedure vital signs reviewed and stable ?Respiratory status: spontaneous breathing, nonlabored ventilation, respiratory function stable and patient connected to nasal cannula oxygen ?Cardiovascular status: blood pressure returned to baseline and stable ?Postop Assessment: no apparent nausea or vomiting ?Anesthetic complications: no ? ? ?No notable events documented. ? ?Last Vitals:  ?Vitals:  ? 10/01/21 1000 10/01/21 1015  ?BP: 135/79 134/80  ?Pulse: 98 96  ?Resp: 18 17  ?Temp: 36.8 ?C   ?SpO2: 100% 96%  ?  ?Last Pain:  ?Vitals:  ? 10/01/21 0534  ?TempSrc: Oral  ? ? ?  ?  ?  ?  ?  ?  ? ?[name] ? ? ? ? ?

## 2021-10-01 NOTE — Evaluation (Signed)
Physical Therapy Evaluation ?Patient Details ?Name: Chad Gomez ?MRN: [id number] ?DOB: 1945/09/21 ?Today's Date: 10/01/2021 ? ?History of Present Illness ? Pt is a 76yo male presenting s/p hernia repair on 10/01/21. PMH: CKD, COPD, DM, hyperlipidemia, HTN, PVD, hx of lung cancer, smoker.  ?Clinical Impression ? TARRIS DELBENE is a 76 y.o. male POD 0 s/p hernia repair. Patient reports modified independence with mobility at baseline. Patient is now limited by functional impairments (see PT problem list below) and requires min assist for transfers, gait deferred secondary to pt still lethargic from anesthesia. Patient instructed in exercise to facilitate ROM and circulation to manage edema. Provided handout on logrolling and pt demonstrated safe technique. Educated pt on splinting abdomen and pt verbalized understanding, abdominal pillow provided. Patient will benefit from continued skilled PT interventions to address impairments and progress towards PLOF. Acute PT will follow to progress mobility and stair training in preparation for safe discharge home.   ?   ? ?Recommendations for follow up therapy are one component of a multi-disciplinary discharge planning process, led by the attending physician.  Recommendations may be updated based on patient status, additional functional criteria and insurance authorization. ? ?Follow Up Recommendations Follow physician's recommendations for discharge plan and follow up therapies ? ?  ?Assistance Recommended at Discharge Set up Supervision/Assistance  ?Patient can return home with the following ? A little help with walking and/or transfers;A little help with bathing/dressing/bathroom;Assistance with cooking/housework;Assist for transportation;Help with stairs or ramp for entrance ? ?  ?Equipment Recommendations Rolling walker (2 wheels)  ?Recommendations for Other Services ?    ?  ?Functional Status Assessment Patient has had a recent decline in their functional status and  demonstrates the ability to make significant improvements in function in a reasonable and predictable amount of time.  ? ?  ?Precautions / Restrictions Precautions ?Precautions: Fall  ? ?  ? ?Mobility ? Bed Mobility ?Overal bed mobility: Needs Assistance ?Bed Mobility: Sidelying to Sit, Rolling ?Rolling: Modified independent (Device/Increase time) ?Sidelying to sit: Modified independent (Device/Increase time) ?  ?  ?  ?General bed mobility comments: Pt educated on logroll technique for bed mobility, verbalized understanding. Pt modified independent for extra time for rolling and sidelying to sit ?  ? ?Transfers ?Overall transfer level: Needs assistance ?Equipment used: Rolling walker (2 wheels) ?Transfers: Sit to/from Stand, Bed to chair/wheelchair/BSC ?Sit to Stand: Min assist ?  ?Step pivot transfers: Min assist ?  ?  ?  ?General transfer comment: Pt min assist for steadying, demonstrating mild posterior lean upon standing. VCs for sequencing and hand placement. ?  ? ?Ambulation/Gait ?  ?  ?  ?  ?  ?  ?  ?General Gait Details: deferred secondary to lethargy ? ?Stairs ?  ?  ?  ?  ?  ? ?Wheelchair Mobility ?  ? ?Modified Rankin (Stroke Patients Only) ?  ? ?  ? ?Balance Overall balance assessment: Needs assistance ?Sitting-balance support: Feet supported, No upper extremity supported ?Sitting balance-Leahy Scale: Fair ?  ?  ?Standing balance support: Bilateral upper extremity supported, During functional activity, Reliant on assistive device for balance ?Standing balance-Leahy Scale: Poor ?  ?  ?  ?  ?  ?  ?  ?  ?  ?  ?  ?  ?   ? ? ? ?Pertinent Vitals/Pain Pain Assessment ?Pain Assessment: No/denies pain  ? ? ?Home Living Family/patient expects to be discharged to:: Private residence ?Living Arrangements: Spouse/significant other ?Available Help at Discharge: Family;Available 24 hours/day ?Type  of Home: House ?Home Access: Stairs to enter ?Entrance Stairs-Rails: Left ?Entrance Stairs-Number of Steps: 3 ?  ?Home  Layout: One level ?Home Equipment: Kasandra Knudsen - single point ?   ?  ?Prior Function Prior Level of Function : Patient poor historian/Family not available (Pt reports he uses cane and does not require assistance for mobility or ADLS; per RN his family reports pt was not mobilizing and required assistance) ?  ?  ?  ?  ?  ?  ?Mobility Comments: Uses SPC during all mobility offload ?ADLs Comments: IND ?  ? ? ?Hand Dominance  ? Dominant Hand: Right ? ?  ?Extremity/Trunk Assessment  ? Upper Extremity Assessment ?Upper Extremity Assessment: Overall WFL for tasks assessed ?  ? ?Lower Extremity Assessment ?Lower Extremity Assessment: Overall WFL for tasks assessed ?  ? ?Cervical / Trunk Assessment ?Cervical / Trunk Assessment: Normal  ?Communication  ? Communication: No difficulties  ?Cognition Arousal/Alertness: Lethargic ?Behavior During Therapy: [facility] for tasks assessed/performed ?Overall Cognitive Status: Within Functional Limits for tasks assessed ?  ?  ?  ?  ?  ?  ?  ?  ?  ?  ?  ?  ?  ?  ?  ?  ?  ?  ?  ? ?  ?General Comments General comments (skin integrity, edema, etc.): Pt on 2LO2 via  throughout session ? ?  ?Exercises Other Exercises ?Other Exercises: Splinting abdomen during mobility ?Other Exercises: logroll, handout provided ?Other Exercises: ankle pumps, 20, every hour ?Other Exercises: incentive spirometry, 5 reps  ? ?Assessment/Plan  ?  ?PT Assessment Patient needs continued PT services  ?PT Problem List Decreased strength;Decreased range of motion;Decreased activity tolerance;Decreased balance;Decreased mobility;Decreased coordination;Decreased knowledge of use of DME;Decreased safety awareness;Pain;Cardiopulmonary status limiting activity ? ?   ?  ?PT Treatment Interventions DME instruction;Gait training;Stair training;Functional mobility training;Therapeutic activities;Therapeutic exercise;Balance training;Neuromuscular re-education;Patient/family education   ? ?PT Goals (Current goals can be found in the Care  Plan section)  ?Acute Rehab PT Goals ?Patient Stated Goal: to reduce pain ?PT Goal Formulation: With patient ?Time For Goal Achievement: 10/15/21 ?Potential to Achieve Goals: Good ? ?  ?Frequency Min 3X/week ?  ? ? ?Co-evaluation   ?  ?  ?  ?  ? ? ?  ?AM-PAC PT "6 Clicks" Mobility  ?Outcome Measure Help needed turning from your back to your side while in a flat bed without using bedrails?: A Little ?Help needed moving from lying on your back to sitting on the side of a flat bed without using bedrails?: A Little ?Help needed moving to and from a bed to a chair (including a wheelchair)?: A Little ?Help needed standing up from a chair using your arms (e.g., wheelchair or bedside chair)?: A Little ?Help needed to walk in hospital room?: A Little ?Help needed climbing 3-5 steps with a railing? : A Little ?6 Click Score: 18 ? ?  ?End of Session Equipment Utilized During Treatment: Gait belt;Oxygen ?Activity Tolerance: Patient limited by lethargy ?Patient left: in chair;with call bell/phone within reach;with chair alarm set ?Nurse Communication: Mobility status ?PT Visit Diagnosis: Pain;Difficulty in walking, not elsewhere classified (R26.2);Muscle weakness (generalized) (M62.81) ?Pain - part of body:  (abdomen) ?  ? ?Time: 1447-1510 ?PT Time Calculation (min) (ACUTE ONLY): 23 min ? ? ?Charges:   PT Evaluation ?$PT Eval Low Complexity: 1 Low ?PT Treatments ?$Therapeutic Activity: 8-22 mins ?  ?   ? ? ?Coolidge Breeze, PT, DPT ?[facility] ?Office: [phone] ?Pager: [phone] ? ?Coolidge Breeze ?10/01/2021,  3:51 PM ? ?

## 2021-10-01 NOTE — Anesthesia Procedure Notes (Signed)
Procedure Name: Intubation ?Date/Time: 10/01/2021 7:37 AM ?Performed by: Garrel Ridgel, CRNA ?Pre-anesthesia Checklist: Patient identified, Emergency Drugs available, Suction available and Patient being monitored ?Patient Re-evaluated:Patient Re-evaluated prior to induction ?Oxygen Delivery Method: Circle system utilized ?Preoxygenation: Pre-oxygenation with 100% oxygen ?Induction Type: IV induction ?Ventilation: Mask ventilation without difficulty ?Laryngoscope Size: Mac and 4 ?Grade View: Grade I ?Tube type: Oral ?Tube size: 7.5 mm ?Number of attempts: 1 ?Airway Equipment and Method: Stylet and Oral airway ?Placement Confirmation: ETT inserted through vocal cords under direct vision, positive ETCO2 and breath sounds checked- equal and bilateral ?Secured at: 22 cm ?Tube secured with: Tape ?Dental Injury: Teeth and Oropharynx as per pre-operative assessment  ? ? ? ? ?

## 2021-10-01 NOTE — Transfer of Care (Signed)
Immediate Anesthesia Transfer of Care Note  Patient: Chad Gomez  Procedure(s) Performed: [place] WITH MESH  Patient Location: PACU  Anesthesia Type:General  Level of Consciousness: oriented, drowsy and patient cooperative  Airway & Oxygen Therapy: Patient Spontanous Breathing and Patient connected to face mask oxygen  Post-op Assessment: Report given to RN and Post -op Vital signs reviewed and stable  Post vital signs: Reviewed and stable  Last Vitals:  Vitals Value Taken Time  BP 135/79 10/01/21 1000  Temp 36.8 C 10/01/21 1000  Pulse 96 10/01/21 1003  Resp 17 10/01/21 1003  SpO2 100 % 10/01/21 1003  Vitals shown include unvalidated device data.  Last Pain:  Vitals:   10/01/21 0534  TempSrc: Oral         Complications: No notable events documented.

## 2021-10-01 NOTE — Op Note (Signed)
PATIENT:  Chad Gomez  76 y.o. male ? ?PRE-OPERATIVE DIAGNOSIS:  INCISIONAL HERNIA ? ?POST-OPERATIVE DIAGNOSIS:  INCISIONAL HERNIA ? ?PROCEDURE:  Procedure(s): ?INCISIONAL HERNIA REPAIR WITH MESH ? ? ?SURGEON:  Surgeon(s): ?Juda Toepfer, Arta Bruce, MD ? ?ASSISTANT: Cameron Sprang, M.D.  ? ?ANESTHESIA:   local and general ? ?Indications for procedure: Chad Gomez is a 76 y.o. year old male with symptoms of periumbilical bulge and pain and nausea. ? ?Description of procedure: The patient was brought into the operative suite. Anesthesia was administered with General endotracheal anesthesia. WHO checklist was applied. The patient was then placed in supine. The area was prepped and draped in the usual sterile fashion. ? ?The vertical scar was incised and cautery used to dissect down to the fascia. The hernia sac was identified and entered to allow visualization of the fascial edges. There were 3 different fascial defects in the area. The fascia was freed from scar and sac in 360 degrees. The total length of fascial defect was 8 cm by 5 cm. The anterior rectus was incised and retrorectus space freed with cautery and blunt dissection. ? ? The hernia sac was removed. The posterior rectus was closed with 0 PDS in running fashion. Due to the size of the hernia, A 13 x 15 cm Bard mesh was sutured to the posterior fascia and placed in the retrorectus space. The anterior rectus fascia defect was then primarily closed with interrupted 0 PDS sutures. The umbilical skin was sutured to the fascia with a 3-0 vicryl. The deep dermal space was closed with a 3-0 vicryl. Marcaine/Exparel mix was injected into the muscle layer and around the fascia. The skin was closed with a 4-0 monocryl subcuticular suture. Dermabond was put in place for dressing. The patient awoke from anesthesia and was brought to pacu in stable condition. All counts were correct. ? ?Findings: 8 x 5 cm fascial defect ? ?Specimen: none ? ?Blood loss: 20 ml ? ?Local  anesthesia: 50 ml Marcaine/Exparel mix ? ?Complications: none ? ?PLAN OF CARE: Admit for overnight observation ? ?PATIENT DISPOSITION:  PACU - hemodynamically stable. ? ?Gurney Maxin, M.D. ?General, Bariatric, & Minimally Invasive Surgery ?[facility], [place] ? ?10/01/2021 ?9:35 AM ? ?

## 2021-10-02 ENCOUNTER — Encounter (HOSPITAL_COMMUNITY): Payer: Self-pay | Admitting: General Surgery

## 2021-10-02 DIAGNOSIS — K432 Incisional hernia without obstruction or gangrene: Secondary | ICD-10-CM | POA: Diagnosis not present

## 2021-10-02 DIAGNOSIS — I739 Peripheral vascular disease, unspecified: Secondary | ICD-10-CM | POA: Diagnosis not present

## 2021-10-02 LAB — BASIC METABOLIC PANEL
Anion gap: 8 (ref 5–15)
BUN: 22 mg/dL (ref 8–23)
CO2: 26 mmol/L (ref 22–32)
Calcium: 9.8 mg/dL (ref 8.9–10.3)
Chloride: 107 mmol/L (ref 98–111)
Creatinine, Ser: 0.85 mg/dL (ref 0.61–1.24)
GFR, Estimated: >60 mL/min (ref >60)
Glucose, Bld: 108 mg/dL — ABNORMAL HIGH (ref 70–99)
Potassium: 4.1 mmol/L (ref 3.5–5.1)
Sodium: 141 mmol/L (ref 135–145)

## 2021-10-02 LAB — CBC
HCT: 41.8 % (ref 39.0–52.0)
Hemoglobin: 13.2 g/dL (ref 13.0–17.0)
MCH: 28.9 pg (ref 26.0–34.0)
MCHC: 31.6 g/dL (ref 30.0–36.0)
MCV: 91.7 fL (ref 80.0–100.0)
Platelets: 272 10*3/uL (ref 150–400)
RBC: 4.56 MIL/uL (ref 4.22–5.81)
RDW: 14.5 % (ref 11.5–15.5)
WBC: 14.2 10*3/uL — ABNORMAL HIGH (ref 4.0–10.5)
nRBC: 0 % (ref 0.0–0.2)

## 2021-10-02 MED ORDER — NICOTINE 21 MG/24HR TD PT24
21.0000 mg | MEDICATED_PATCH | Freq: Every day | TRANSDERMAL | Status: DC
  Administered 2021-10-02 – 2021-10-07 (×6): 21 mg via TRANSDERMAL
  Filled 2021-10-02 (×6): qty 1

## 2021-10-02 MED ORDER — CHLORHEXIDINE GLUCONATE CLOTH 2 % EX PADS
6.0000 | MEDICATED_PAD | Freq: Every day | CUTANEOUS | Status: DC
  Administered 2021-10-03 – 2021-10-07 (×5): 6 via TOPICAL

## 2021-10-02 NOTE — Progress Notes (Signed)
Physical Therapy Treatment ?Patient Details ?Name: Chad Gomez ?MRN: [id number] ?DOB: December 16, 1945 ?Today's Date: 10/02/2021 ? ? ?History of Present Illness Pt is a 76yo male presenting s/p hernia repair on 10/01/21. PMH: CKD, COPD, DM, hyperlipidemia, HTN, PVD, hx of lung cancer, smoker. ? ?  ?PT Comments  ? ? Pt seen POD1 s/p hernia repair, resting comfortably in bed with no major complaints. Pt taken through oxygen saturation walking test, see previous note for results. Vitals taken in lying, sitting, and post-exercise, (see chart of vital signs below). Pt continues to report SOB with minor activity even while on oxygen, stating "I feel so weak." Pt modified independent for bed mobility (extra time) and supervision for gait with RW, no physical assist required. Recommended pt walk outside of PT sessions with nursing staff and mobility specialists, pt verbalized agreement. We will continue to follow the pt acutely to promote modified independence with functional mobility and safe discharge to the destination indicated below.  ? ? ? 10/02/21 1445 10/02/21 1447 10/02/21 1452  ?Vital Signs  ?Pulse Rate 85 96 94  ?Pulse Rate Source Dinamap Dinamap Dinamap  ?BP 96/60 (!) 109/96 103/74  ?MAP (mmHg) 70 103 84  ?BP Location Right Arm Right Arm Right Arm  ?BP Method Automatic Automatic Automatic  ?Patient Position (if appropriate) Lying Sitting Sitting ?(post-ambulation)  ?Oxygen Therapy  ?SpO2 93 % 90 % (!) 88 %  ?O2 Device Nasal Cannula Nasal Cannula Nasal Cannula  ?O2 Flow Rate (L/min) 3 L/min 3 L/min 4 L/min  ?   ?Recommendations for follow up therapy are one component of a multi-disciplinary discharge planning process, led by the attending physician.  Recommendations may be updated based on patient status, additional functional criteria and insurance authorization. ? ?Follow Up Recommendations ? Follow physician's recommendations for discharge plan and follow up therapies ?  ?  ?Assistance Recommended at Discharge Set  up Supervision/Assistance  ?Patient can return home with the following A little help with walking and/or transfers;A little help with bathing/dressing/bathroom;Assistance with cooking/housework;Assist for transportation;Help with stairs or ramp for entrance ?  ?Equipment Recommendations ? Rolling walker (2 wheels)  ?  ?Recommendations for Other Services   ? ? ?  ?Precautions / Restrictions Precautions ?Precautions: Fall  ?  ? ?Mobility ? Bed Mobility ?Overal bed mobility: Needs Assistance ?Bed Mobility: Sidelying to Sit, Rolling ?Rolling: Modified independent (Device/Increase time) ?Sidelying to sit: Supervision, Modified independent (Device/Increase time) ?  ?  ?  ?General bed mobility comments: Pt modified independent for extra time for rolling and sidelying to sit. ?  ? ?Transfers ?Overall transfer level: Needs assistance ?Equipment used: Rolling walker (2 wheels) ?Transfers: Sit to/from Stand, Bed to chair/wheelchair/BSC ?Sit to Stand: Supervision ?  ?  ?  ?  ?  ?General transfer comment: Pt supervision for sit to stand and stand to sit transfer for safety and line management only, no physical assist required. ?  ? ?Ambulation/Gait ?Ambulation/Gait assistance: Supervision ?Gait Distance (Feet): 40 Feet ?Assistive device: Rolling walker (2 wheels) ?Gait Pattern/deviations: Step-through pattern, Decreased stride length, Narrow base of support ?Gait velocity: decreased ?  ?  ?General Gait Details: Pt taking through ambulation oxygen testing; was able to walk 10 feet on room air desatted to 72%, rested in recliner on 4LO2 via [place] until saturation reached 93%. Second bout of ambulation 19ft on 3LO2 until he reached 82% and was SOB, seated in recliner and recovered O2 saturation to 93%. VSS. Supervision level of assist for all ambulation tasks, no physical assist required  though did follow with recliner for safety. ? ? ?Stairs ?  ?  ?  ?  ?  ? ? ?Wheelchair Mobility ?  ? ?Modified Rankin (Stroke Patients Only) ?  ? ? ?   ?Balance Overall balance assessment: Needs assistance ?Sitting-balance support: Feet supported, No upper extremity supported ?Sitting balance-Leahy Scale: Fair ?  ?  ?Standing balance support: Bilateral upper extremity supported, During functional activity, Reliant on assistive device for balance ?Standing balance-Leahy Scale: Poor ?  ?  ?  ?  ?  ?  ?  ?  ?  ?  ?  ?  ?  ? ?  ?Cognition Arousal/Alertness: Lethargic, Awake/alert ?Behavior During Therapy: [facility] for tasks assessed/performed ?Overall Cognitive Status: Within Functional Limits for tasks assessed ?  ?  ?  ?  ?  ?  ?  ?  ?  ?  ?  ?  ?  ?  ?  ?  ?  ?  ?  ? ?  ?Exercises Other Exercises ?Other Exercises: ankle pumps, 20 ? ?  ?General Comments   ?  ?  ? ?Pertinent Vitals/Pain Pain Assessment ?Pain Assessment: 0-10 ?Pain Score: 6  ?Breathing: occasional labored breathing, short period of hyperventilation ?Negative Vocalization: none ?Facial Expression: smiling or inexpressive ?Body Language: relaxed ?Consolability: no need to console ?PAINAD Score: 1 ?Pain Location: abdomen ?Pain Descriptors / Indicators: Operative site guarding, Discomfort ?Pain Intervention(s): Limited activity within patient's tolerance, Monitored during session, Repositioned, Ice applied  ? ? ?Home Living   ?  ?  ?  ?  ?  ?  ?  ?  ?  ?   ?  ?Prior Function    ?  ?  ?   ? ?PT Goals (current goals can now be found in the care plan section) Acute Rehab PT Goals ?Patient Stated Goal: to reduce pain ?PT Goal Formulation: With patient ?Time For Goal Achievement: 10/15/21 ?Potential to Achieve Goals: Good ?Progress towards PT goals: Progressing toward goals ? ?  ?Frequency ? ? ? Min 3X/week ? ? ? ?  ?PT Plan Current plan remains appropriate  ? ? ?Co-evaluation   ?  ?  ?  ?  ? ?  ?AM-PAC PT "6 Clicks" Mobility   ?Outcome Measure ? Help needed turning from your back to your side while in a flat bed without using bedrails?: A Little ?Help needed moving from lying on your back to sitting on the side of  a flat bed without using bedrails?: A Little ?Help needed moving to and from a bed to a chair (including a wheelchair)?: A Little ?Help needed standing up from a chair using your arms (e.g., wheelchair or bedside chair)?: A Little ?Help needed to walk in hospital room?: A Little ?Help needed climbing 3-5 steps with a railing? : A Little ?6 Click Score: 18 ? ?  ?End of Session Equipment Utilized During Treatment: Gait belt;Oxygen ?Activity Tolerance: Patient tolerated treatment well ?Patient left: in chair;with call bell/phone within reach;with chair alarm set ?Nurse Communication: Mobility status;Other (comment) (Results of O2 test) ?PT Visit Diagnosis: Pain;Difficulty in walking, not elsewhere classified (R26.2);Muscle weakness (generalized) (M62.81) ?Pain - part of body:  (abdomen) ?  ? ? ?Time: 3846-6599 ?PT Time Calculation (min) (ACUTE ONLY): 39 min ? ?Charges:  $Gait Training: 38-52 mins          ?          ?Coolidge Breeze, PT, DPT ?[facility] ?Office: [phone] ?Pager: [phone] ? ? ?Coolidge Breeze ?  10/02/2021, 3:44 PM ? ?

## 2021-10-02 NOTE — Progress Notes (Signed)
Patient laying in bed oxygen saturation 80% on room air. 02 @ 3L applied via n/c. Patient oxygen saturation came up to 89-90%.  ?

## 2021-10-02 NOTE — TOC Transition Note (Signed)
Transition of Care (TOC) - CM/SW Discharge Note ? ?Patient Details  ?Name: Maclain J Hoppes ?MRN: [id number] ?Date of Birth: 04/20/1946 ? ?Transition of Care (TOC) CM/SW Contact:  ?Megan S Glenn, LCSW ?Phone Number: ?10/02/2021, 11:54 AM ? ?Clinical Narrative: TOC notified patient will need a rolling walker. CSW met with patient and he is agreeable to a rolling walker being delivered to his room by [employer]. CSW made DME referral to Danielle with [employer]. Walker has been delivered to patient's room. TOC signing off. ? ?Final next level of care: Home/Self Care ?Barriers to Discharge: No Barriers Identified ? ?Patient Goals and CMS Choice ?Patient states their goals for this hospitalization and ongoing recovery are:: Return home ?CMS Medicare.gov Compare Post Acute Care list provided to:: Patient ?Choice offered to / list presented to : Patient ? ?Discharge Plan and Services        ?DME Arranged: Walker rolling ?DME Agency: [employer] ?Date DME Agency Contacted: 10/02/21 ?Time DME Agency Contacted: 1022 ?Representative spoke with at DME Agency: Danielle ? ?Readmission Risk Interventions ?   ? View : No data to display.  ?  ?  ?  ? ?

## 2021-10-02 NOTE — Progress Notes (Signed)
SATURATION QUALIFICATIONS: (This note is used to comply with regulatory documentation for home oxygen) ? ?Patient Saturations on Room Air at Rest = 80% ? ?Patient Saturations on Room Air while Ambulating 85% ? ?Patient Saturations on 3 Liters of oxygen while Ambulating = 89-90% ? ? ?

## 2021-10-02 NOTE — Discharge Summary (Signed)
?Physician Discharge Summary  ?Chad Gomez CWC:[id number] DOB: 1945-08-12 DOA: 10/01/2021 ? ?PCP: Chad Pretty, MD ? ?Admit date: 10/01/2021 ?Discharge date: 10/02/2021 ? ?Recommendations for Outpatient Follow-up:  ?Rolling walker (include homehealth, outpatient follow-up instructions, specific recommendations for PCP to follow-up on, etc.) ? ? ?Discharge Diagnoses:  ?Principal Problem: ?  Incisional hernia ? ? ?Surgical Procedure: open incisional hernia repair with mesh ? ?Discharge Condition: Good ?Disposition: Home ? ?Diet recommendation: diabetic diet ? ? ?Hospital Course:  ?76 yo male with incisional hernia presented for repair. Post op he was observed. He was able to ambulate POD 0. He tolerated food and was discharged home POD 1 with minimal pain issues. ? ?Discharge Instructions ? ?Discharge Instructions   ? ? Call MD for:  difficulty breathing, headache or visual disturbances   Complete by: As directed ?  ? Call MD for:  hives   Complete by: As directed ?  ? Call MD for:  persistant nausea and vomiting   Complete by: As directed ?  ? Call MD for:  redness, tenderness, or signs of infection (pain, swelling, redness, odor or green/yellow discharge around incision site)   Complete by: As directed ?  ? Call MD for:  severe uncontrolled pain   Complete by: As directed ?  ? Call MD for:  temperature >100.4   Complete by: As directed ?  ? Diet - low sodium heart healthy   Complete by: As directed ?  ? Discharge wound care:   Complete by: As directed ?  ? Ok to Games developer. Glue will likely peel off in 1-3 weeks. No bandage required  ? Driving Restrictions   Complete by: As directed ?  ? No driving while on narcotics  ? Increase activity slowly   Complete by: As directed ?  ? Lifting restrictions   Complete by: As directed ?  ? No lifting greater than 20 pounds for 3 weeks  ? ?  ? ?Allergies as of 10/02/2021   ? ?   Reactions  ? Gabapentin Other (See Comments)  ? "Slurred speech in high doses"  ? Morphine And  Related Anxiety, Other (See Comments)  ? Combative, incoherent    ? Tape Other (See Comments), Rash  ? Surgical tape ?Adhesive tape causes blisters  ? Allevyn Adhesive [wound Dressings] Other (See Comments)  ? blisters  ? Amoxicillin Other (See Comments)  ? Severe stomach pain  ? Naproxen Swelling  ? Eyes swell  ? ?  ? ?  ?Medication List  ?  ? ?TAKE these medications   ? ?acetaminophen 650 MG CR tablet ?Commonly known as: TYLENOL ?Take 1,300 mg by mouth every 8 (eight) hours as needed for pain. ?  ?acetaminophen 500 MG tablet ?Commonly known as: TYLENOL ?Take 2 tablets (1,000 mg total) by mouth every 6 (six) hours as needed for mild pain. ?  ?albuterol 108 (90 Base) MCG/ACT inhaler ?Commonly known as: VENTOLIN HFA ?Inhale 2 puffs into the lungs every 6 (six) hours as needed for wheezing or shortness of breath. ?  ?amLODipine 10 MG tablet ?Commonly known as: NORVASC ?Take 10 mg by mouth daily. ?  ?aspirin EC 81 MG tablet ?Take 81 mg by mouth daily. ?  ?atorvastatin 40 MG tablet ?Commonly known as: LIPITOR ?Take 40 mg by mouth daily. ?  ?ezetimibe 10 MG tablet ?Commonly known as: ZETIA ?Take 5 mg by mouth daily. ?  ?finasteride 5 MG tablet ?Commonly known as: PROSCAR ?TAKE 1 TABLET BY MOUTH  DAILY ?  ?  fluticasone 50 MCG/ACT nasal spray ?Commonly known as: FLONASE ?Place 1 spray into both nostrils daily as needed for allergies or rhinitis. ?  ?gabapentin 100 MG capsule ?Commonly known as: NEURONTIN ?Take 100-200 mg by mouth See admin instructions. Take 200 mg by mouth in the morning and take 100 mg at bedtime ?  ?Glucerna Liqd ?Take 237 mLs by mouth daily. ?  ?metFORMIN 500 MG 24 hr tablet ?Commonly known as: GLUCOPHAGE-XR ?Take 1,000 mg by mouth 2 (two) times daily. Morning and evening ?  ?mupirocin ointment 2 % ?Commonly known as: BACTROBAN ?Apply 1 application topically daily. With dressing changes. ?  ?olmesartan 40 MG tablet ?Commonly known as: BENICAR ?Take 40 mg by mouth daily. ?  ?potassium citrate 10 MEQ  (1080 MG) SR tablet ?Commonly known as: UROCIT-K ?Take 10 mEq by mouth 3 (three) times daily with meals. ?  ?Stiolto Respimat 2.5-2.5 MCG/ACT Aers ?Generic drug: Tiotropium Bromide-Olodaterol ?Inhale 2 puffs into the lungs daily. ?  ?traMADol 50 MG tablet ?Commonly known as: ULTRAM ?Take 1 tablet (50 mg total) by mouth every 6 (six) hours as needed (mild pain). ?  ?Vitamin D-3 25 MCG (1000 UT) Caps ?Take 1,000 Units by mouth daily. ?  ? ?  ? ?  ?  ? ? ?  ?Durable Medical Equipment  ?(From admission, onward)  ?  ? ? ?  ? ?  Start     Ordered  ? 10/02/21 1029  For home use only DME Walker rolling  Once       ?Question Answer Comment  ?Walker: With 5 Inch Wheels   ?Patient needs a walker to treat with the following condition Surgery, elective   ?  ? 10/02/21 1028  ? ?  ?  ? ?  ? ? ?  ?Discharge Care Instructions  ?(From admission, onward)  ?  ? ? ?  ? ?  Start     Ordered  ? 10/02/21 0000  Discharge wound care:       ?Comments: Ok to Games developer. Glue will likely peel off in 1-3 weeks. No bandage required  ? 10/02/21 1218  ? ?  ?  ? ?  ? ? ? ? ?The results of significant diagnostics from this hospitalization (including imaging, microbiology, ancillary and laboratory) are listed below for reference.   ? ?Significant Diagnostic Studies: ?No results found. ? ?Labs: ?Basic Metabolic Panel: ?Recent Labs  ?Lab 09/26/21 ?0922 10/01/21 ?1250 10/02/21 ?0337  ?NA 143  --  141  ?K 4.8  --  4.1  ?CL 109  --  107  ?CO2 24  --  26  ?GLUCOSE 124*  --  108*  ?BUN 21  --  22  ?CREATININE 1.08 0.92 0.85  ?CALCIUM 10.5*  --  9.8  ? ?Liver Function Tests: ?No results for input(s): AST, ALT, ALKPHOS, BILITOT, PROT, ALBUMIN in the last 168 hours. ? ?CBC: ?Recent Labs  ?Lab 09/26/21 ?0922 10/01/21 ?1250 10/02/21 ?0337  ?WBC 11.0* 12.5* 14.2*  ?HGB 15.6 14.2 13.2  ?HCT 48.9 44.9 41.8  ?MCV 92.4 92.4 91.7  ?PLT 325 275 272  ? ? ?CBG: ?Recent Labs  ?Lab 09/26/21 ?7096 10/01/21 ?2836 10/01/21 ?1000 10/01/21 ?1210 10/01/21 ?1633  ?GLUCAP 118*  117* 129* 127* 135*  ? ? ?Principal Problem: ?  Incisional hernia ? ? ?Time coordinating discharge: 15 min ? ?

## 2021-10-02 NOTE — Progress Notes (Addendum)
SATURATION QUALIFICATIONS: (This note is used to comply with regulatory documentation for home oxygen) ? ?Patient Saturations on Room Air at Rest = 88% ? ?Patient Saturations on Room Air while Ambulating = 79%, 10 feet. ? ?Patient Saturations on 3 Liters of oxygen while Ambulating = 82%, 30 feet. ? ?Please briefly explain why patient needs home oxygen: Pt de-saturating while ambulating on room air but better able to maintain saturation with oxygen. ? ?Coolidge Breeze, PT, DPT ?[facility] Department ?Office: [phone] ?Pager: [phone] ?

## 2021-10-02 NOTE — Consult Note (Signed)
[facility] CM Inpatient Consult ? ? ?10/02/2021 ? ?Chad Gomez ?1946/03/12 ?[id number] ? ?[place] [facility] CM) ?  ?Patient is currently active with [facility] CM services. Active with [facility] CM health coach for chronic disease management. ? ?Plan: Will continue to follow for progression and disposition. Will update community RN of patient admission. ? ?Of note, [facility] Care Management services does not replace or interfere with any services that are arranged by inpatient case management or social work.  ? ?Netta Cedars, MSN, RN ?[facility]ia[facility] ?Phone [phone] ?Toll free office [phone] ?

## 2021-10-03 ENCOUNTER — Other Ambulatory Visit: Payer: Self-pay | Admitting: *Deleted

## 2021-10-03 ENCOUNTER — Inpatient Hospital Stay (HOSPITAL_COMMUNITY): Payer: Medicare Other

## 2021-10-03 ENCOUNTER — Encounter: Payer: Self-pay | Admitting: *Deleted

## 2021-10-03 DIAGNOSIS — J9601 Acute respiratory failure with hypoxia: Secondary | ICD-10-CM | POA: Diagnosis not present

## 2021-10-03 DIAGNOSIS — Z9221 Personal history of antineoplastic chemotherapy: Secondary | ICD-10-CM | POA: Diagnosis not present

## 2021-10-03 DIAGNOSIS — Z79890 Hormone replacement therapy (postmenopausal): Secondary | ICD-10-CM | POA: Diagnosis not present

## 2021-10-03 DIAGNOSIS — Z515 Encounter for palliative care: Secondary | ICD-10-CM | POA: Diagnosis not present

## 2021-10-03 DIAGNOSIS — R54 Age-related physical debility: Secondary | ICD-10-CM | POA: Diagnosis present

## 2021-10-03 DIAGNOSIS — Z818 Family history of other mental and behavioral disorders: Secondary | ICD-10-CM | POA: Diagnosis not present

## 2021-10-03 DIAGNOSIS — Z751 Person awaiting admission to adequate facility elsewhere: Secondary | ICD-10-CM | POA: Diagnosis not present

## 2021-10-03 DIAGNOSIS — F32A Depression, unspecified: Secondary | ICD-10-CM | POA: Diagnosis present

## 2021-10-03 DIAGNOSIS — Z79899 Other long term (current) drug therapy: Secondary | ICD-10-CM | POA: Diagnosis not present

## 2021-10-03 DIAGNOSIS — J90 Pleural effusion, not elsewhere classified: Secondary | ICD-10-CM | POA: Diagnosis not present

## 2021-10-03 DIAGNOSIS — J432 Centrilobular emphysema: Secondary | ICD-10-CM | POA: Diagnosis present

## 2021-10-03 DIAGNOSIS — R531 Weakness: Secondary | ICD-10-CM | POA: Diagnosis not present

## 2021-10-03 DIAGNOSIS — Z8249 Family history of ischemic heart disease and other diseases of the circulatory system: Secondary | ICD-10-CM | POA: Diagnosis not present

## 2021-10-03 DIAGNOSIS — Z7189 Other specified counseling: Secondary | ICD-10-CM | POA: Diagnosis not present

## 2021-10-03 DIAGNOSIS — I10 Essential (primary) hypertension: Secondary | ICD-10-CM | POA: Diagnosis present

## 2021-10-03 DIAGNOSIS — Z888 Allergy status to other drugs, medicaments and biological substances status: Secondary | ICD-10-CM | POA: Diagnosis not present

## 2021-10-03 DIAGNOSIS — Z85118 Personal history of other malignant neoplasm of bronchus and lung: Secondary | ICD-10-CM | POA: Diagnosis not present

## 2021-10-03 DIAGNOSIS — N400 Enlarged prostate without lower urinary tract symptoms: Secondary | ICD-10-CM | POA: Diagnosis present

## 2021-10-03 DIAGNOSIS — F17210 Nicotine dependence, cigarettes, uncomplicated: Secondary | ICD-10-CM | POA: Diagnosis present

## 2021-10-03 DIAGNOSIS — Z8719 Personal history of other diseases of the digestive system: Secondary | ICD-10-CM

## 2021-10-03 DIAGNOSIS — Z66 Do not resuscitate: Secondary | ICD-10-CM | POA: Diagnosis present

## 2021-10-03 DIAGNOSIS — Z823 Family history of stroke: Secondary | ICD-10-CM | POA: Diagnosis not present

## 2021-10-03 DIAGNOSIS — J449 Chronic obstructive pulmonary disease, unspecified: Secondary | ICD-10-CM | POA: Diagnosis not present

## 2021-10-03 DIAGNOSIS — R0902 Hypoxemia: Secondary | ICD-10-CM | POA: Diagnosis not present

## 2021-10-03 DIAGNOSIS — E1151 Type 2 diabetes mellitus with diabetic peripheral angiopathy without gangrene: Secondary | ICD-10-CM | POA: Diagnosis present

## 2021-10-03 DIAGNOSIS — J9811 Atelectasis: Secondary | ICD-10-CM | POA: Diagnosis not present

## 2021-10-03 DIAGNOSIS — K432 Incisional hernia without obstruction or gangrene: Secondary | ICD-10-CM | POA: Diagnosis present

## 2021-10-03 DIAGNOSIS — J439 Emphysema, unspecified: Secondary | ICD-10-CM | POA: Diagnosis not present

## 2021-10-03 DIAGNOSIS — E785 Hyperlipidemia, unspecified: Secondary | ICD-10-CM | POA: Diagnosis present

## 2021-10-03 DIAGNOSIS — R0602 Shortness of breath: Secondary | ICD-10-CM | POA: Diagnosis not present

## 2021-10-03 DIAGNOSIS — Z7982 Long term (current) use of aspirin: Secondary | ICD-10-CM | POA: Diagnosis not present

## 2021-10-03 DIAGNOSIS — Z885 Allergy status to narcotic agent status: Secondary | ICD-10-CM | POA: Diagnosis not present

## 2021-10-03 MED ORDER — BUDESONIDE 0.5 MG/2ML IN SUSP
0.5000 mg | Freq: Two times a day (BID) | RESPIRATORY_TRACT | Status: DC
  Administered 2021-10-03 – 2021-10-07 (×8): 0.5 mg via RESPIRATORY_TRACT
  Filled 2021-10-03 (×8): qty 2

## 2021-10-03 MED ORDER — AZITHROMYCIN 250 MG PO TABS
250.0000 mg | ORAL_TABLET | Freq: Every day | ORAL | Status: AC
  Administered 2021-10-04 – 2021-10-07 (×4): 250 mg via ORAL
  Filled 2021-10-03 (×4): qty 1

## 2021-10-03 MED ORDER — IOHEXOL 350 MG/ML SOLN
75.0000 mL | Freq: Once | INTRAVENOUS | Status: AC | PRN
  Administered 2021-10-03: 75 mL via INTRAVENOUS

## 2021-10-03 MED ORDER — REVEFENACIN 175 MCG/3ML IN SOLN
175.0000 ug | Freq: Every day | RESPIRATORY_TRACT | Status: DC
  Administered 2021-10-04 – 2021-10-07 (×4): 175 ug via RESPIRATORY_TRACT
  Filled 2021-10-03 (×5): qty 3

## 2021-10-03 MED ORDER — SODIUM CHLORIDE 0.9 % IV SOLN
2.0000 g | INTRAVENOUS | Status: DC
  Administered 2021-10-03 – 2021-10-06 (×4): 2 g via INTRAVENOUS
  Filled 2021-10-03 (×5): qty 20

## 2021-10-03 MED ORDER — AZITHROMYCIN 250 MG PO TABS
500.0000 mg | ORAL_TABLET | Freq: Every day | ORAL | Status: AC
  Administered 2021-10-03: 500 mg via ORAL
  Filled 2021-10-03: qty 2

## 2021-10-03 NOTE — Progress Notes (Signed)
?  2 Days Post-Op  ? ?Chief Complaint/Subjective: ?Low O2 kept him in the hospital, more incisional pain today ? ?Objective: ?Vital signs in last 24 hours: ?Temp:  [97.6 ?F (36.4 ?C)-98.3 ?F (36.8 ?C)] 98.3 ?F (36.8 ?C) (04/13 0400) ?Pulse Rate:  [85-97] 97 (04/13 0400) ?Resp:  [16-18] 18 (04/13 0400) ?BP: (95-126)/(60-96) 126/82 (04/13 0400) ?SpO2:  [88 %-99 %] 95 % (04/13 0400) ?Last BM Date : 10/01/21 ?Intake/Output from previous day: ?04/12 0701 - 04/13 0700 ?In: -  ?Out: 650 [Urine:650] ?Intake/Output this shift: ?No intake/output data recorded. ? ?PE: ?Gen: NAD ?Resp: nonlabored ?Card: RRR ?Abd: soft, incision intact, loose skin with 1 area of pink color ? ?Lab Results:  ?Recent Labs  ?  10/01/21 ?1250 10/02/21 ?0337  ?WBC 12.5* 14.2*  ?HGB 14.2 13.2  ?HCT 44.9 41.8  ?PLT 275 272  ? ?BMET ?Recent Labs  ?  10/01/21 ?1250 10/02/21 ?0337  ?NA  --  141  ?K  --  4.1  ?CL  --  107  ?CO2  --  26  ?GLUCOSE  --  108*  ?BUN  --  22  ?CREATININE 0.92 0.85  ?CALCIUM  --  9.8  ? ?PT/INR ?No results for input(s): LABPROT, INR in the last 72 hours. ?CMP  ?   ?Component Value Date/Time  ? NA 141 10/02/2021 0337  ? K 4.1 10/02/2021 0337  ? CL 107 10/02/2021 0337  ? CO2 26 10/02/2021 0337  ? GLUCOSE 108 (H) 10/02/2021 1540  ? BUN 22 10/02/2021 0337  ? CREATININE 0.85 10/02/2021 0337  ? CREATININE 1.00 08/19/2021 1105  ? CALCIUM 9.8 10/02/2021 0337  ? PROT 6.7 08/19/2021 1105  ? ALBUMIN 3.8 08/19/2021 1105  ? AST 13 (L) 08/19/2021 1105  ? ALT 6 08/19/2021 1105  ? ALKPHOS 106 08/19/2021 1105  ? BILITOT 0.5 08/19/2021 1105  ? GFRNONAA >60 10/02/2021 0337  ? GFRNONAA >60 08/19/2021 1105  ? GFRAA >60 01/30/2020 0854  ? ?Lipase  ?   ?Component Value Date/Time  ? LIPASE 22 01/15/2020 1704  ? ? ?Studies/Results: ?No results found. ? ?Anti-infectives: ?Anti-infectives (From admission, onward)  ? ? Start     Dose/Rate Route Frequency Ordered Stop  ? 10/01/21 0600  ceFAZolin (ANCEF) IVPB 2g/100 mL premix       ? 2 g ?200 mL/hr over 30  Minutes Intravenous On call to O.R. 10/01/21 0867 10/01/21 0745  ? ?  ? ? ?Assessment/Plan ? s/p Procedure(s): ?INCISIONAL HERNIA REPAIR WITH MESH 10/01/2021  ?-will consult pulmonology for COPD and O2 needs post op ? ?FEN - diabetic diet ?VTE - lovenox ?ID - no issues ?Disposition - weaning O2 ? ? LOS: 0 days  ? ?I reviewed last 24 h vitals and pain scores, last 48 h intake and output, last 24 h labs and trends, and last 24 h imaging results. ? ?This care required moderate level of medical decision making.  ? ?Arta Bruce Daniele Dillow ?[facility] ?10/03/2021, 7:47 AM ?Please see Amion for pager number during day hours 7:00am-4:30pm or 7:00am -11:30am on weekends ? ? ?

## 2021-10-03 NOTE — Plan of Care (Signed)
  Problem: Education: Goal: Knowledge of General Education information will improve Description: Including pain rating scale, medication(s)/side effects and non-pharmacologic comfort measures Outcome: Progressing   Problem: Pain Managment: Goal: General experience of comfort will improve Outcome: Progressing   Problem: Safety: Goal: Ability to remain free from injury will improve Outcome: Progressing   

## 2021-10-03 NOTE — Consult Note (Signed)
? ?NAME:  Chad Gomez, MRN:  [id number], DOB:  04/05/1946, LOS: 0 ?ADMISSION DATE:  10/01/2021, CONSULTATION DATE:  10/03/2021 ?REFERRING MD:  Dr. Kieth Brightly, CHIEF COMPLAINT:  Hypoxia   ? ?History of Present Illness:  ?Chad Gomez is a 76 y.o. with a PMH significant for Stage IV adenocarcinomia of the lungs, prior stage IIIA non-small cell lung cancer (s/p LUL resection May 2021 and chemo but this ended early due to intolerance), COPD, diabetes, HTN, HLD, and BPH who presented 4/11 for elective hernia repair with Dr. Kieth Brightly. Per patient he hoped that the hernia repair would help improve his ability to eat which would inturn improve his quality of life and that the hernia pain would improve. ? ?Patient tolerated surgery well and was planned to discharge 4/12 but unfortunately the care team was unable to wean supplemental oxygen. PCCM was consulted for pulmonary recxcs to assist in oxygen wean. CXR 4/13 revealed left base atelectasis vs scarring is with increased right basilar opacity is noted concerning for possible pneumonia or atelectasis. ? ?Pertinent  Medical History  ?Stage IV adenocarcinomia of the lungs, prior stage IIIA non-small cell lung cancer (s/p LUL resection May 2021 and chemi but this ended early due to intolerance), COPD, diabetes, HTN, HLD, and BPH ? ?Significant Hospital Events: ?Including procedures, antibiotic start and stop dates in addition to other pertinent events   ?4/11 admitted for elective hernia repair ?4/12 unable to wean supplemental oxygen secondary to hypoxia ?4/13 PCCM consulted  ?CXR 4/13 revealed left base atelectasis vs scarring, increased right basilar opacity is noted concerning for possible pneumonia or atelectasis. ? ?Interim History / Subjective:  ?Seen lying in bed with reported frustration and depression at continued hospitalization  ? ?Objective   ?Blood pressure 126/82, pulse 97, temperature 98.3 ?F (36.8 ?C), temperature source Oral, resp. rate 18, height 5'  10" (1.778 m), weight 65.8 kg, SpO2 92 %. ?   ?   ? ?Intake/Output Summary (Last 24 hours) at 10/03/2021 1532 ?Last data filed at 10/03/2021 0930 ?Gross per 24 hour  ?Intake 120 ml  ?Output 550 ml  ?Net -430 ml  ? ?Filed Weights  ? 10/01/21 1930  ?Weight: 65.8 kg  ? ? ?Examination: ?General: Chronically ill appearing deconditioned thin elderly male lying in bed, in NAD ?HEENT: [place]/AT, MM pink/moist, PERRL,  ?Neuro: Alert and oriented x3, non-focal  ?CV: s1s2 regular rate and rhythm, no murmur, rubs, or gallops,  ?PULM:  Clear to ascultation, no increased work of breathing, no added breath sounds  ?GI: soft, bowel sounds active in all 4 quadrants, non-tender, non-distended, tolerating oral diet  ?Extremities: warm/dry, no edema  ?Skin: no rashes or lesions ? ?Resolved Hospital Problem list   ? ? ?Assessment & Plan:  ?Stage IV adenocarcinomia of the lung ?-Seen by Oncology February 2023 with no treatment options seen, palliative care recommended  ?Prior stage IIIA non-small cell lung cancer  ?-S/p LUL resection May 2021 and chemo but this ended early due to intolerance ?Hx of malignant pleural effusion  ?-Managed previously with Pleurx but this was removed 1/23 as output dropped  ?-POCUS 4/13 negative for recurrent pleural effusion  ?Hx of COPD ?New hypoxic respiratory failure  ?-Postoperatively patient was seen with new hypoxia that required use of supplemental oxygen  ?Concern for evolving CAP  ?-CXR 4/13 revealed left base atelectasis vs scarring, increased right basilar opacity is noted concerning for possible pneumonia or atelectasis. ?P: ?Change bronchodilators to Yupelri and Pulmicort  ?Avoid systemic steroids given recent  surgery  ?Start empiric CAP coverage  ?Consult placed to Palliative care  ?Mobilize as able Encourage pulmonary hygiene  ?Minimize sedation meds to avoid hypoventilation  ?Aspiration precautions  ?Outpatient pulmonary follow up  ?Would recommend setting patient up with home oxygen and possible  nebulizer prior to discharge  ? ? ?Best Practice (right click and "Reselect all SmartList Selections" daily)  ?Per primary  ? ?Labs   ?CBC: ?Recent Labs  ?Lab 10/01/21 ?1250 10/02/21 ?0337  ?WBC 12.5* 14.2*  ?HGB 14.2 13.2  ?HCT 44.9 41.8  ?MCV 92.4 91.7  ?PLT 275 272  ? ? ?Basic Metabolic Panel: ?Recent Labs  ?Lab 10/01/21 ?1250 10/02/21 ?0337  ?NA  --  141  ?K  --  4.1  ?CL  --  107  ?CO2  --  26  ?GLUCOSE  --  108*  ?BUN  --  22  ?CREATININE 0.92 0.85  ?CALCIUM  --  9.8  ? ?GFR: ?Estimated Creatinine Clearance: 71 mL/min (by C-G formula based on SCr of 0.85 mg/dL). ?Recent Labs  ?Lab 10/01/21 ?1250 10/02/21 ?0337  ?WBC 12.5* 14.2*  ? ? ?Liver Function Tests: ?No results for input(s): AST, ALT, ALKPHOS, BILITOT, PROT, ALBUMIN in the last 168 hours. ?No results for input(s): LIPASE, AMYLASE in the last 168 hours. ?No results for input(s): AMMONIA in the last 168 hours. ? ?ABG ?   ?Component Value Date/Time  ? PHART 7.407 10/29/2019 0430  ? PCO2ART 41.5 10/29/2019 0430  ? PO2ART 66.5 (L) 10/29/2019 0430  ? HCO3 25.6 10/29/2019 0430  ? TCO2 33 (H) 10/28/2019 1501  ? ACIDBASEDEF 1.0 10/28/2019 1501  ? O2SAT 94.1 10/29/2019 0430  ?  ? ?Coagulation Profile: ?No results for input(s): INR, PROTIME in the last 168 hours. ? ?Cardiac Enzymes: ?No results for input(s): CKTOTAL, CKMB, CKMBINDEX, TROPONINI in the last 168 hours. ? ?HbA1C: ?Hgb A1c MFr Bld  ?Date/Time Value Ref Range Status  ?09/26/2021 09:22 AM 5.8 (H) 4.8 - 5.6 % Final  ?  Comment:  ?  (NOTE) ?Pre diabetes:          5.7%-6.4% ? ?Diabetes:              >6.4% ? ?Glycemic control for   <7.0% ?adults with diabetes ?  ?12/26/2019 10:04 AM 6.1 (H) 4.8 - 5.6 % Final  ?  Comment:  ?  (NOTE) ?Pre diabetes:          5.7%-6.4% ? ?Diabetes:              >6.4% ? ?Glycemic control for   <7.0% ?adults with diabetes ?  ? ? ?CBG: ?Recent Labs  ?Lab 10/01/21 ?6644 10/01/21 ?1000 10/01/21 ?1210 10/01/21 ?1633  ?GLUCAP 117* 129* 127* 135*  ? ? ?Review of Systems:   ?Please see  the history of present illness. All other systems reviewed and are negative  ? ?Past Medical History:  ?He,  has a past medical history of BPH (benign prostatic hypertrophy), Chronic kidney disease, Complication of anesthesia, COPD (chronic obstructive pulmonary disease) ([place]), Diabetes mellitus without complication ([place]), History of bladder stone, History of colon polyps, History of kidney stones, History of melanoma excision, Hyperlipidemia, Hypertension, Iron deficiency, Lung cancer, lingula ([place]) (11/01/2019), Open wound, Peripheral vascular disease ([place]), Right ureteral stone, SBO (small bowel obstruction) ([place]) (12/26/2019), and Shingles (05-15-2015).  ? ?Surgical History:  ? ?Past Surgical History:  ?Procedure Laterality Date  ? ABDOMINAL AORTAGRAM  11/01/2014  ? Procedure: Abdominal Aortagram;  Surgeon: Serafina Mitchell, MD;  Location: [facility]  INVASIVE CV LAB;  Service: Cardiovascular;;  ? AORTA - BILATERAL FEMORAL ARTERY BYPASS GRAFT N/A 12/28/2014  ? Procedure: AORTOBIFEMORAL BYPASS GRAFT;  Surgeon: Serafina Mitchell, MD;  [name] OR;  Service: Vascular;  Laterality: N/A;  ? BRONCHIAL BRUSHINGS  09/06/2019  ? Procedure: BRONCHIAL BRUSHINGS;  Surgeon: Collene Gobble, MD;  Location: [facility] ENDOSCOPY;  Service: Pulmonary;;  left upper lobe nodule  ? BRONCHIAL WASHINGS  09/06/2019  ? Procedure: BRONCHIAL WASHINGS;  Surgeon: Collene Gobble, MD;  Location: [facility] ENDOSCOPY;  Service: Pulmonary;;  ? CHEST TUBE INSERTION N/A 06/20/2021  ? Procedure: INSERTION PLEURAL DRAINAGE CATHETER;  Surgeon: Margaretha Seeds, MD;  Location: [facility] ENDOSCOPY;  Service: Cardiopulmonary;  Laterality: N/A;  ? COLONOSCOPY W/ POLYPECTOMY  09-13-2013  ? CYSTOLITHALOPAXY OF BLADDER STONE/ TRANSRECTAL ULTRASOUND PROSTATE BX  08-19-2005  ? CYSTOSCOPY W/ RETROGRADES Right 12/02/2012  ? Procedure: CYSTOSCOPY WITH RETROGRADE PYELOGRAM;  Surgeon: Malka So, MD;  Location: [facility];  Service: Urology;  Laterality: Right;  ? CYSTOSCOPY W/  RETROGRADES Bilateral 10/13/2013  ? Procedure: CYSTOSCOPY WITH RETROGRADE PYELOGRAM;  Surgeon: Irine Seal, MD;  Location: [facility];  Service: Urology;  Laterality: Bilateral;  ? CYSTOSCOPY W

## 2021-10-03 NOTE — Telephone Encounter (Signed)
This encounter was created in error - please disregard.

## 2021-10-03 NOTE — Patient Outreach (Signed)
[facility]) Care Management ? ?10/03/2021 ? ?Gwen Her Corbello ?05-27-1946 ?[id number] ? ?Patient was admitted to the hospital on 10/03/21. Nurse Health Coach will perform case closure and transfer patient to the embedded CM. [facility] Liaison Coordinator informed this nurse of patient's admission.  ?  ?Plan: ?RN Health Coach Discipline Closure ?Discipline Closure letter sent to PCP  ? ?[name] RN, BSN ?Nurse Health Coach ?[place] ?[phone] ?[email or web address] ? ? ?

## 2021-10-04 DIAGNOSIS — R531 Weakness: Secondary | ICD-10-CM

## 2021-10-04 DIAGNOSIS — Z7189 Other specified counseling: Secondary | ICD-10-CM

## 2021-10-04 DIAGNOSIS — Z515 Encounter for palliative care: Secondary | ICD-10-CM

## 2021-10-04 NOTE — Progress Notes (Signed)
?3 Days Post-Op  ? ?Chief Complaint/Subjective: ?Pain stable, still trouble weaning O2 ? ?Objective: ?Vital signs in last 24 hours: ?Temp:  [98.2 ?F (36.8 ?C)-98.6 ?F (37 ?C)] 98.2 ?F (36.8 ?C) (04/14 1529) ?Pulse Rate:  [92-101] 93 (04/14 1529) ?Resp:  [18] 18 (04/14 1529) ?BP: (93-142)/(63-85) 132/79 (04/14 1529) ?SpO2:  [91 %-96 %] 93 % (04/14 1529) ?Last BM Date : 10/01/21 ?Intake/Output from previous day: ?04/13 0701 - 04/14 0700 ?In: 58 [P.O.:780; IV Piggyback:100] ?Out: 800 [Urine:800] ?Intake/Output this shift: ?Total I/O ?In: -  ?Out: 350 [Urine:350] ? ?PE: ?Gen: NAD ?Resp: mouth puckering ?Card: RRR ?Abd: soft, incision c/d/i ? ?Lab Results:  ?Recent Labs  ?  10/02/21 ?0337  ?WBC 14.2*  ?HGB 13.2  ?HCT 41.8  ?PLT 272  ? ?BMET ?Recent Labs  ?  10/02/21 ?0337  ?NA 141  ?K 4.1  ?CL 107  ?CO2 26  ?GLUCOSE 108*  ?BUN 22  ?CREATININE 0.85  ?CALCIUM 9.8  ? ?PT/INR ?No results for input(s): LABPROT, INR in the last 72 hours. ?CMP  ?   ?Component Value Date/Time  ? NA 141 10/02/2021 0337  ? K 4.1 10/02/2021 0337  ? CL 107 10/02/2021 0337  ? CO2 26 10/02/2021 0337  ? GLUCOSE 108 (H) 10/02/2021 6160  ? BUN 22 10/02/2021 0337  ? CREATININE 0.85 10/02/2021 0337  ? CREATININE 1.00 08/19/2021 1105  ? CALCIUM 9.8 10/02/2021 0337  ? PROT 6.7 08/19/2021 1105  ? ALBUMIN 3.8 08/19/2021 1105  ? AST 13 (L) 08/19/2021 1105  ? ALT 6 08/19/2021 1105  ? ALKPHOS 106 08/19/2021 1105  ? BILITOT 0.5 08/19/2021 1105  ? GFRNONAA >60 10/02/2021 0337  ? GFRNONAA >60 08/19/2021 1105  ? GFRAA >60 01/30/2020 0854  ? ?Lipase  ?   ?Component Value Date/Time  ? LIPASE 22 01/15/2020 1704  ? ? ?Studies/Results: ?CT Angio Chest Pulmonary Embolism (PE) W or WO Contrast ? ?Result Date: 10/03/2021 ?CLINICAL DATA:  resp failure, post-op, rule out PE, ?pneumonia EXAM: CT ANGIOGRAPHY CHEST WITH CONTRAST TECHNIQUE: Multidetector CT imaging of the chest was performed using the standard protocol during bolus administration of intravenous contrast.  Multiplanar CT image reconstructions and MIPs were obtained to evaluate the vascular anatomy. RADIATION DOSE REDUCTION: This exam was performed according to the departmental dose-optimization program which includes automated exposure control, adjustment of the mA and/or kV according to patient size and/or use of iterative reconstruction technique. CONTRAST:  79mL OMNIPAQUE IOHEXOL 350 MG/ML SOLN COMPARISON:  None. FINDINGS: Cardiovascular: Satisfactory opacification of the pulmonary arteries to the segmental level. No evidence of pulmonary embolism. Normal heart size. No pericardial effusion. Calcific atherosclerosis of the aorta and coronary arteries Mediastinum/Nodes: Increased irregular left hilar soft tissue. Enlarged 1.8 cm AP window node. Unremarkable appearance of the visualized trachea and esophagus. Lungs/Pleura: Increasing irregular/nodular left-sided pleural thickening, loculated pleural fluid, including increased size of nodular component along the suture line. Increased/new patchy consolidation throughout the left lung. Right middle lobe atelectasis. Small right pleural effusion. Emphysema. No pneumothorax. Upper Abdomen: Cholelithiasis. Musculoskeletal: No acute findings. Review of the MIP images confirms the above findings. IMPRESSION: 1. No evidence of acute pulmonary embolism. 2. Increasing irregular/nodular left-sided pleural thickening, loculated left pleural fluid, enlarged AP window node, and hilar soft tissue, concerning for progressive malignancy in this patient with known lung cancer. 3. Increased/new patchy consolidation throughout the left lung, concerning for pneumonia. Differentiating pneumonia from malignancy is difficult and a follow-up chest CT after treatment may be helpful if clinically warranted. 4. Right  middle lobe atelectasis and small right pleural effusion. 5. Aortic Atherosclerosis (ICD10-I70.0) and Emphysema (ICD10-J43.9). 6. Cholelithiasis. Electronically Signed   By:  Margaretha Sheffield M.D.   On: 10/03/2021 17:22  ? ?DG CHEST PORT 1 VIEW ? ?Result Date: 10/03/2021 ?CLINICAL DATA:  Shortness of breath. EXAM: PORTABLE CHEST 1 VIEW COMPARISON:  July 29, 2021. FINDINGS: Stable cardiomediastinal silhouette. Stable left basilar atelectasis or scarring is noted with associated pleural thickening and volume loss. Mild mediastinal shift to the left is noted. Increased right basilar opacity is noted concerning for possible pneumonia or atelectasis. Bony thorax is unremarkable. IMPRESSION: Stable left basilar atelectasis or scarring is noted with associated pleural thickening and associated volume loss. Increased right basilar opacity is noted concerning for possible pneumonia or atelectasis. Electronically Signed   By: Marijo Conception M.D.   On: 10/03/2021 14:04   ? ?Anti-infectives: ?Anti-infectives (From admission, onward)  ? ? Start     Dose/Rate Route Frequency Ordered Stop  ? 10/04/21 1000  azithromycin (ZITHROMAX) tablet 250 mg       ?See Hyperspace for full Linked Orders Report.  ? 250 mg Oral Daily 10/03/21 1458 10/08/21 0959  ? 10/03/21 1600  cefTRIAXone (ROCEPHIN) 2 g in sodium chloride 0.9 % 100 mL IVPB       ? 2 g ?200 mL/hr over 30 Minutes Intravenous Every 24 hours 10/03/21 1506    ? 10/03/21 1545  azithromycin (ZITHROMAX) tablet 500 mg       ?See Hyperspace for full Linked Orders Report.  ? 500 mg Oral Daily 10/03/21 1458 10/03/21 1712  ? 10/01/21 0600  ceFAZolin (ANCEF) IVPB 2g/100 mL premix       ? 2 g ?200 mL/hr over 30 Minutes Intravenous On call to O.R. 10/01/21 0523 10/01/21 0745  ? ?  ? ? ?Assessment/Plan ? s/p Procedure(s): ?INCISIONAL HERNIA REPAIR WITH MESH 10/01/2021  ?-palliative care and pulmonology involved with plans for short term SNF ? ?FEN - diabetic diet ?VTE - lovenox ?ID - no issues ?Disposition - pending SNF placement ? ? LOS: 1 day  ? ?I reviewed last 24 h vitals and pain scores, last 48 h intake and output, last 24 h labs and trends, and last 24 h  imaging results. ? ?This care required moderate level of medical decision making.  ? ?Chad Gomez ?[facility] ?10/04/2021, 3:56 PM ?Please see Amion for pager number during day hours 7:00am-4:30pm or 7:00am -11:30am on weekends ? ? ?

## 2021-10-04 NOTE — Consult Note (Signed)
? ?                                                                                ?Consultation Note ?Date: 10/04/2021  ? ?Patient Name: Chad Gomez  ?DOB: [birth year]  MRN: [id number]  Age / Sex: 76 y.o., male  ?PCP: [name], MD ?Referring Physician: Kinsinger, Arta Bruce, MD ? ?Reason for Consultation: Establishing goals of care ? ?HPI/Patient Profile: 76 y.o. male   admitted on 10/01/2021  ? ?57M with stage IV adenocarcinoma of the lungs and severe COPD who was admitted 4/11 for elective umbilical hernia repair who developed new oxygen need since admission.  ?  ? ?Clinical Assessment and Goals of Care: ? Patient has been seen by PCCM for acute hypoxic respiratory failure, has life limiting illness of stage IV adenocarcinoma of the lung, also has severe COPD.  Patient has been admitted for abdominal hernia repair.  He was not on home oxygen.  Patient to undergo treatment for possible pneumonia.  Also to undergo CT scan to rule out pulmonary embolism.  Nebulized breathing treatments as well as antibiotics are being attempted.  He might need home oxygen on discharge. ? ?Palliative medicine consult has also been requested for CODE STATUS and broad goals of care discussions. ?Patient is awake alert resting in bed.  He has ongoing cough and is a trying to bring out sputum. ?Palliative medicine is specialized medical care for people living with serious illness. It focuses on providing relief from the symptoms and stress of a serious illness. The goal is to improve quality of life for both the patient and the family. ?Goals of care: Broad aims of medical therapy in relation to the patient's values and preferences. Our aim is to provide medical care aimed at enabling patients to achieve the goals that matter most to them, given the circumstances of their particular medical situation and their constraints.  ? ?Patient follows with Dr. Earlie Server in the outpatient setting.  At the previous visit discussions were held  regarding initiating hospice/palliative services versus scope of further palliative chemotherapy. ? ?Goals wishes and values attempted to be explored.  Discussed with patient about CODE STATUS.  Differences between full code versus DNR/DNI explained in detail.  Patient elects for DNR/DNI. ? ?Broad goals of care discussions undertaken.  Patient does not believe he would want to undergo further chemotherapy.  With his permission we explored about the differences between hospice versus palliative services.  Discussed about hospice philosophy of care.  Discussed about different levels of hospice support. ? ?He asked that call be placed and these discussions also be held with his wife Stanton Kidney. ? ?NEXT OF KIN ?Wife Stanton Kidney ? ?DISCUSSION/SUMMARY OF RECOMMENDATIONS   ?Call placed and discussed with wife Stanton Kidney.  Discussed with her about conversations mentioned above.  Goals of care also undertaken with wife Stanton Kidney as well as disposition options.  Stanton Kidney states that they live in a trailer and there is no room for home-based nursing support or physical therapy.  Stanton Kidney states that the patient wavers in his decision about whether or not to proceed with further chemotherapy.  She would like to take him back to Dr. Lew Dawes office 1  more time for final discussions. ?We discussed about possibility of having physical therapy evaluate the patient so as to consider skilled nursing facility rehabilitation attempt with palliative services following.  I informed Stanton Kidney about the patient's decision to proceed with establishment of DNR/DNI and she was in agreement. ? ?Code Status/Advance Care Planning: ?DNR ? ? ?Symptom Management:  ? ? ?Palliative Prophylaxis:  ?Delirium Protocol ?Psycho-social/Spiritual:  ?Desire for further Chaplaincy support:yes ?Additional Recommendations: Education on Hospice ? ?Prognosis:  ?Guarded ? ?Discharge Planning: [place] for rehab with Palliative care service follow-up  ? ?  ? ?Primary  Diagnoses: ?Present on Admission: ? Incisional hernia ? ? ?I have reviewed the medical record, interviewed the patient and family, and examined the patient. The following aspects are pertinent. ? ?Past Medical History:  ?Diagnosis Date  ? BPH (benign prostatic hypertrophy)   ? weak stream  ? Chronic kidney disease   ? Complication of anesthesia   ? urinary retention  ? COPD (chronic obstructive pulmonary disease) ([place])   ? Diabetes mellitus without complication ([place])   ? Type II  ? History of bladder stone   ? History of colon polyps   ? 08/2013   pre-canerous  ? History of kidney stones   ? History of melanoma excision   ? LEFT EAR  ? Hyperlipidemia   ? Hypertension   ? Iron deficiency   ? Lung cancer, lingula ([place]) 11/01/2019  ? Open wound   ? left lower jaw; followed by ENT  ? Peripheral vascular disease ([place])   ? Right ureteral stone   ? SBO (small bowel obstruction) ([place]) 12/26/2019  ? Shingles 05-15-2015  ? ?Social History  ? ?Socioeconomic History  ? Marital status: Married  ?  Spouse name: [name]  ? Number of children: 2  ? Years of education: Not on file  ? Highest education level: 10th grade  ?Occupational History  ? Occupation: retired  ?Tobacco Use  ? Smoking status: Every Day  ?  Packs/day: 2.00  ?  Years: 60.00  ?  Pack years: 120.00  ?  Types: Cigarettes  ?  Start date: 06/24/1959  ? Smokeless tobacco: Never  ? Tobacco comments:  ?  a little over a pack per day  ?Vaping Use  ? Vaping Use: Former  ?Substance and Sexual Activity  ? Alcohol use: No  ?  Alcohol/week: 0.0 standard drinks  ? Drug use: No  ? Sexual activity: Not on file  ?Other Topics Concern  ? Not on file  ?Social History Narrative  ? Patient is married with 2 children.  ? Patient is right handed.  ? Patient has some college education.  ? Patient does not drink caffeine.  ? ?Social Determinants of Health  ? ?Financial Resource Strain: Medium Risk  ? Difficulty of Paying Living Expenses: Somewhat hard  ?Food Insecurity: No Food Insecurity  ?  Worried About Charity fundraiser in the Last Year: Never true  ? Ran Out of Food in the Last Year: Never true  ?Transportation Needs: Unmet Transportation Needs  ? Lack of Transportation (Medical): Yes  ? Lack of Transportation (Non-Medical): Yes  ?Physical Activity: Not on file  ?Stress: Not on file  ?Social Connections: Not on file  ? ?Family History  ?Problem Relation Age of Onset  ? Dementia Mother   ? Hypertension Mother   ? Stroke Mother 5  ? Dementia Father   ? Hypertension Sister   ? Colon cancer Neg Hx   ? ?Scheduled Meds: ?  acetaminophen  1,000 mg Oral Q6H  ? amLODipine  10 mg Oral Daily  ? arformoterol  15 mcg Nebulization BID  ? aspirin EC  81 mg Oral Daily  ? atorvastatin  40 mg Oral Daily  ? azithromycin  250 mg Oral Daily  ? budesonide (PULMICORT) nebulizer solution  0.5 mg Nebulization BID  ? Chlorhexidine Gluconate Cloth  6 each Topical Daily  ? cholecalciferol  1,000 Units Oral Daily  ? enoxaparin (LOVENOX) injection  40 mg Subcutaneous Q24H  ? ezetimibe  5 mg Oral Daily  ? finasteride  5 mg Oral Daily  ? gabapentin  200 mg Oral Daily  ? And  ? gabapentin  100 mg Oral QHS  ? irbesartan  37.5 mg Oral Daily  ? metFORMIN  1,000 mg Oral BID WC  ? mupirocin ointment  1 application. Topical Daily  ? nicotine  21 mg Transdermal Daily  ? potassium citrate  10 mEq Oral TID WC  ? revefenacin  175 mcg Nebulization Daily  ? ?Continuous Infusions: ? cefTRIAXone (ROCEPHIN)  IV 2 g (10/03/21 1708)  ? ?PRN Meds:.albuterol, diphenhydrAMINE **OR** diphenhydrAMINE, fluticasone, HYDROcodone-acetaminophen, HYDROmorphone (DILAUDID) injection, metoprolol tartrate, ondansetron **OR** ondansetron (ZOFRAN) IV, polyethylene glycol, traMADol ?Medications Prior to Admission:  ?Prior to Admission medications   ?Medication Sig Start Date End Date Taking? Authorizing Provider  ?acetaminophen (TYLENOL) 650 MG CR tablet Take 1,300 mg by mouth every 8 (eight) hours as needed for pain.   Yes [provider]  ?albuterol  (VENTOLIN HFA) 108 (90 Base) MCG/ACT inhaler Inhale 2 puffs into the lungs every 6 (six) hours as needed for wheezing or shortness of breath. 05/06/21  Yes Margaretha Seeds, MD  ?amLODipine (NORVASC) 10 MG tablet Take 10 mg

## 2021-10-04 NOTE — Progress Notes (Signed)
SATURATION QUALIFICATIONS: (This note is used to comply with regulatory documentation for home oxygen) ? ?Patient Saturations on Room Air at Rest = 88% ? ?Patient Saturations on Room Air while Ambulating = 85% ? ?Patient Saturations on 15 Liters of oxygen while Ambulating = 90% ? ?Please briefly explain why patient needs home oxygen: Pt is desatting while walking on RA, pt barely able to maintain 90-92% saturation level on 15LO2 via [place] during ambulation and reports SOB and weakness during ambulation task with lengthy recovery period post-ambulation.  ? ?Coolidge Breeze, PT, DPT ?[facility] ?Office: [phone] ?Pager: [phone] ?

## 2021-10-04 NOTE — Progress Notes (Addendum)
Physical Therapy Treatment ?Patient Details ?Name: Chad Gomez ?MRN: [id number] ?DOB: 17-Nov-1945 ?Today's Date: 10/04/2021 ? ? ?History of Present Illness Pt is a 76yo male presenting s/p hernia repair on 10/01/21. PMH: CKD, COPD, DM, hyperlipidemia, HTN, PVD, hx of lung cancer, smoker. ? ?  ?PT Comments  ? ? Pt reporting no pain and agreeable to be seen by therapy.  Pt supervision for bed mobility and min guard for transfers and ambulation task today. Pt taken through oxygen saturation walking test unable to perform more than just a few steps on RA without desatting to 85% and was barely able to maintain O2 saturations at 90% on 15L via [place] during 30" ambulation (see full numbers in pulmonary note). Pt reporting feeling weak, SOB, and that he is "declining rapidly, generally discouraged." Given his limited mobility and declining functional status feel he would be benefit from SNF-level therapies upon discharge and have updated recommendations accordingly. We will continue to follow him acutely to promote safe discharge to the destination indicated below.  ?  ?Recommendations for follow up therapy are one component of a multi-disciplinary discharge planning process, led by the attending physician.  Recommendations may be updated based on patient status, additional functional criteria and insurance authorization. ? ?Follow Up Recommendations ? Skilled nursing-short term rehab (<3 hours/day) ?  ?  ?Assistance Recommended at Discharge Frequent or constant Supervision/Assistance  ?Patient can return home with the following Assistance with cooking/housework;Assist for transportation;Help with stairs or ramp for entrance;A lot of help with walking and/or transfers;A lot of help with bathing/dressing/bathroom ?  ?Equipment Recommendations ? Rolling walker (2 wheels)  ?  ?Recommendations for Other Services   ? ? ?  ?Precautions / Restrictions Precautions ?Precautions: Fall  ?  ? ?Mobility ? Bed Mobility ?Overal bed  mobility: Needs Assistance ?Bed Mobility: Sidelying to Sit, Rolling, Sit to Sidelying ?Rolling: Supervision ?Sidelying to sit: Supervision ?  ?  ?Sit to sidelying: Supervision ?General bed mobility comments: Pt supervision for bed mobility, VCs for logroll technique. ?  ? ?Transfers ?Overall transfer level: Needs assistance ?Equipment used: Rolling walker (2 wheels) ?Transfers: Sit to/from Stand, Bed to chair/wheelchair/BSC ?Sit to Stand: Min guard ?  ?Step pivot transfers: Min guard ?  ?  ?  ?General transfer comment: Pt min guard for sit to stand and stand to sit transfer for safety and line management only, no physical assist required. Pt min guard for step pivot transfer ?  ? ?Ambulation/Gait ?Ambulation/Gait assistance: Min guard, +2 safety/equipment ?Gait Distance (Feet): 14 Feet ?Assistive device: Rolling walker (2 wheels) ?Gait Pattern/deviations: Step-through pattern, Decreased stride length, Narrow base of support, Staggering left, Staggering right, Trunk flexed ?Gait velocity: decreased ?  ?  ?General Gait Details: Pt taken through ambulation oxygen testing; was able to walk 4 feet on room air desatted to 85%, rested in recliner on 10LO2 via [place] until saturation reached 92%. Second bout of ambulation 65ft on 15LO2 during which time he remained between 90-92% but became SOB, seated in recliner and recovered O2 saturation to 93%. VSS. min guard level of assist for all ambulation tasks, no physical assist required though +2 with recliner for safety. Pt coughing and SOB during all seated rest breaks today. ? ? ?Stairs ?  ?  ?  ?  ?  ? ? ?Wheelchair Mobility ?  ? ?Modified Rankin (Stroke Patients Only) ?  ? ? ?  ?Balance Overall balance assessment: Needs assistance ?Sitting-balance support: Feet supported, No upper extremity supported ?Sitting balance-Leahy Scale: Fair ?  ?  ?  Standing balance support: Bilateral upper extremity supported, During functional activity, Reliant on assistive device for  balance ?Standing balance-Leahy Scale: Poor ?  ?  ?  ?  ?  ?  ?  ?  ?  ?  ?  ?  ?  ? ?  ?Cognition Arousal/Alertness: Awake/alert ?Behavior During Therapy: [facility] for tasks assessed/performed ?Overall Cognitive Status: Within Functional Limits for tasks assessed ?  ?  ?  ?  ?  ?  ?  ?  ?  ?  ?  ?  ?  ?  ?  ?  ?  ?  ?  ? ?  ?Exercises Other Exercises ?Other Exercises: ankle pumps, 20 ? ?  ?General Comments General comments (skin integrity, edema, etc.): Pt reported "ear fullness and pressure" during standing and sitting that resolves with lying supine and says he has been this way PTA. Pt reports he feels this sensation today while ambulating and sitting in recliner. ?  ?  ? ?Pertinent Vitals/Pain Pain Assessment ?Pain Assessment: No/denies pain ?Pain Location: abdomen ?Pain Descriptors / Indicators: Operative site guarding, Discomfort  ? ? ?Home Living   ?  ?  ?  ?  ?  ?  ?  ?  ?  ?   ?  ?Prior Function    ?  ?  ?   ? ?PT Goals (current goals can now be found in the care plan section) Acute Rehab PT Goals ?Patient Stated Goal: to reduce pain ?PT Goal Formulation: With patient ?Time For Goal Achievement: 10/15/21 ?Potential to Achieve Goals: Fair ?Progress towards PT goals: Progressing toward goals ? ?  ?Frequency ? ? ? Min 2X/week ? ? ? ?  ?PT Plan Discharge plan needs to be updated  ? ? ?Co-evaluation   ?  ?  ?  ?  ? ?  ?AM-PAC PT "6 Clicks" Mobility   ?Outcome Measure ? Help needed turning from your back to your side while in a flat bed without using bedrails?: A Little ?Help needed moving from lying on your back to sitting on the side of a flat bed without using bedrails?: A Little ?Help needed moving to and from a bed to a chair (including a wheelchair)?: A Little ?Help needed standing up from a chair using your arms (e.g., wheelchair or bedside chair)?: A Lot ?Help needed to walk in hospital room?: A Lot ?Help needed climbing 3-5 steps with a railing? : A Lot ?6 Click Score: 15 ? ?  ?End of Session Equipment  Utilized During Treatment: Gait belt;Oxygen (Pt on 4LO2 via [place] upon entry; pt on 13LO2 via [place] upon exit.) ?Activity Tolerance: No increased pain ?Patient left: with call bell/phone within reach;in bed;with bed alarm set ?Nurse Communication: Mobility status;Other (comment) (Results of O2 test) ?PT Visit Diagnosis: Pain;Difficulty in walking, not elsewhere classified (R26.2);Muscle weakness (generalized) (M62.81) ?Pain - part of body:  (abdomen) ?  ? ? ?Time: 2023-3435 ?PT Time Calculation (min) (ACUTE ONLY): 41 min ? ?Charges:  $Gait Training: 38-52 mins          ?          ? ?Coolidge Breeze, PT, DPT ?[facility] ?Office: [phone] ?Pager: [phone] ?Coolidge Breeze ?10/04/2021, 1:35 PM ? ?

## 2021-10-04 NOTE — Plan of Care (Signed)
  Problem: Pain Managment: Goal: General experience of comfort will improve Outcome: Progressing   Problem: Safety: Goal: Ability to remain free from injury will improve Outcome: Progressing   Problem: Skin Integrity: Goal: Risk for impaired skin integrity will decrease Outcome: Progressing   

## 2021-10-04 NOTE — TOC Progression Note (Signed)
Transition of Care (TOC) - Progression Note  ? ?Patient Details  ?Name: Chad Gomez ?MRN: [id number] ?Date of Birth: [birth year] ? ?Transition of Care (TOC) CM/SW Contact  ?Sherie Don, LCSW ?Phone Number: ?10/04/2021, 1:52 PM ? ?Clinical Narrative: PT evaluation and treatment team are now recommending SNF. CSW spoke with patient's wife and wife confirmed she and the patient are agreeable to short-term rehab before returning home. FL2 done; PASRR received. CSW faxed initial referral in the hub. TOC awaiting bed offers. ? ?Expected Discharge Plan: [place] ?Barriers to Discharge: Ship broker, SNF Pending bed offer ? ?Expected Discharge Plan and Services ?Expected Discharge Plan: [place] ?In-house Referral: Clinical Social Work ?Post Acute Care Choice: [place] ?Living arrangements for the past 2 months: Mobile Home             ?DME Arranged: Walker rolling ?DME Agency: [employer] ?Date DME Agency Contacted: 10/02/21 ?Time DME Agency Contacted: [phone] ?Representative spoke with at DME Agency: Andee Poles ? ?Readmission Risk Interventions ?   ? View : No data to display.  ?  ?  ?  ? ? ?

## 2021-10-04 NOTE — [place] FL2 (Signed)
?[place] MEDICAID FL2 LEVEL OF CARE SCREENING TOOL  ?  ? ?IDENTIFICATION  ?Patient Name: ?Chad Gomez Birthdate: 06/09/1946 Sex: male Admission Date (Current Location): ?10/01/2021  ?[place] and [place] Number: ? [place] ?  Facility and Address:  ?[facility],  [place] [place], [place] ?     Provider Number: ?[id number]  ?Attending Physician Name and Address:  ?Kinsinger, Arta Bruce, MD ? Relative Name and Phone Number:  ?Isidore Margraf (spouse) Ph: [phone] ?   ?Current Level of Care: ?Hospital Recommended Level of Care: ?[place] Prior Approval Number: ?  ? ?Date Approved/Denied: ?  PASRR Number: ?[id number] A ? ?Discharge Plan: ?SNF ?  ? ?Current Diagnoses: ?Patient Active Problem List  ? Diagnosis Date Noted  ? History of incisional hernia repair 10/03/2021  ? Incisional hernia 10/01/2021  ? PVD (peripheral vascular disease) ([place]) 08/29/2021  ? Cellulitis of chest wall 08/26/2021  ? Malignant pleural effusion 05/30/2021  ? Lung nodule 05/08/2021  ? Protein-calorie malnutrition, severe 12/28/2019  ? Leukopenia due to antineoplastic chemotherapy ([place]) 12/26/2019  ? Sepsis ([place]) 12/26/2019  ? Ventral hernia 12/26/2019  ? Chronic indwelling Foley catheter 12/26/2019  ? Encounter for antineoplastic immunotherapy 12/19/2019  ? Adenocarcinoma of left lung, stage 3 ([place]) 12/16/2019  ? Encounter for antineoplastic chemotherapy 11/30/2019  ? Goals of care, counseling/discussion 11/09/2019  ? Lung cancer, lingula ([place]) 11/01/2019  ? S/P partial lobectomy of lung 10/28/2019  ? Dyslipidemia 10/06/2019  ? Type 2 diabetes mellitus with complication, without long-term current use of insulin ([place]) 10/06/2019  ? Tobacco abuse 09/06/2019  ? Hypertension 09/06/2019  ? Nephrolithiasis 09/06/2019  ? Melanoma ([place]) 09/06/2019  ? Centrilobular emphysema ([place]) 05/23/2019  ? Stage 4 very severe COPD by GOLD classification ([place]) 05/23/2019  ? Atherosclerosis of native artery of both lower  extremities with intermittent claudication ([place]) 05/26/2016  ? S/P aortobifemoral bypass surgery 05/26/2016  ? Ureteral stone with hydronephrosis 03/21/2014  ? Ulnar neuropathy 02/07/2014  ? Compulsive tobacco user syndrome 02/07/2014  ? Allergic rhinitis 02/07/2014  ? ? ?Orientation RESPIRATION BLADDER Height & Weight   ?  ?Self, Time, Situation, Place ? O2 (3-4L/min) Continent Weight: 145 lb 1 oz (65.8 kg) ?Height:  5\' 10"  (177.8 cm)  ?BEHAVIORAL SYMPTOMS/MOOD NEUROLOGICAL BOWEL NUTRITION STATUS  ?   (N/A) Continent Diet (Carb modified)  ?AMBULATORY STATUS COMMUNICATION OF NEEDS Skin   ?Limited Assist Verbally Surgical wounds ?  ?  ?  ?    ?     ?     ? ? ?Personal Care Assistance Level of Assistance  ?Bathing, Feeding, Dressing Bathing Assistance: Limited assistance ?Feeding assistance: Independent ?Dressing Assistance: Limited assistance ?   ? ?Functional Limitations Info  ?Sight, Hearing, Speech Sight Info: Impaired ?Hearing Info: Adequate ?Speech Info: Adequate  ? ? ?SPECIAL CARE FACTORS FREQUENCY  ?PT (By licensed PT), OT (By licensed OT)   ?  ?PT Frequency: 5x's/week ?OT Frequency: 5x's/week ?  ?  ?  ?   ? ? ?Contractures Contractures Info: Not present  ? ? ?Additional Factors Info  ?Code Status, Allergies Code Status Info: DNR ?Allergies Info: Gabapentin, Morphine And Related, Tape, Allevyn Adhesive (Wound Dressings), Amoxicillin, Naproxen ?  ?  ?  ?   ? ?Current Medications (10/04/2021):  This is the current hospital active medication list ?Current Facility-Administered Medications  ?Medication Dose Route Frequency Provider Last Rate Last Admin  ? acetaminophen (TYLENOL) tablet 1,000 mg  1,000 mg Oral Q6H Kinsinger, Arta Bruce, MD   1,000 mg  at 10/04/21 0555  ? albuterol (PROVENTIL) (2.5 MG/3ML) 0.083% nebulizer solution 2.5 mg  2.5 mg Inhalation Q6H PRN Kinsinger, Arta Bruce, MD      ? amLODipine (NORVASC) tablet 10 mg  10 mg Oral Daily Kinsinger, Arta Bruce, MD   10 mg at 10/04/21 1052  ? arformoterol  (BROVANA) nebulizer solution 15 mcg  15 mcg Nebulization BID Kinsinger, Arta Bruce, MD   15 mcg at 10/04/21 4193  ? aspirin EC tablet 81 mg  81 mg Oral Daily Kinsinger, Arta Bruce, MD   81 mg at 10/04/21 1052  ? atorvastatin (LIPITOR) tablet 40 mg  40 mg Oral Daily Kinsinger, Arta Bruce, MD   40 mg at 10/04/21 1051  ? azithromycin (ZITHROMAX) tablet 250 mg  250 mg Oral Daily Freda Jackson B, MD   250 mg at 10/04/21 1053  ? budesonide (PULMICORT) nebulizer solution 0.5 mg  0.5 mg Nebulization BID Freddi Starr, MD   0.5 mg at 10/04/21 0913  ? cefTRIAXone (ROCEPHIN) 2 g in sodium chloride 0.9 % 100 mL IVPB  2 g Intravenous Q24H Freda Jackson B, MD 200 mL/hr at 10/03/21 1708 2 g at 10/03/21 1708  ? Chlorhexidine Gluconate Cloth 2 % PADS 6 each  6 each Topical Daily Kinsinger, Arta Bruce, MD   6 each at 10/04/21 1056  ? cholecalciferol (VITAMIN D3) tablet 1,000 Units  1,000 Units Oral Daily Kinsinger, Arta Bruce, MD   1,000 Units at 10/04/21 1051  ? diphenhydrAMINE (BENADRYL) 12.5 MG/5ML elixir 12.5 mg  12.5 mg Oral Q6H PRN Kinsinger, Arta Bruce, MD      ? Or  ? diphenhydrAMINE (BENADRYL) injection 12.5 mg  12.5 mg Intravenous Q6H PRN Kinsinger, Arta Bruce, MD      ? enoxaparin (LOVENOX) injection 40 mg  40 mg Subcutaneous Q24H Kinsinger, Arta Bruce, MD   40 mg at 10/04/21 0827  ? ezetimibe (ZETIA) tablet 5 mg  5 mg Oral Daily Kinsinger, Arta Bruce, MD   5 mg at 10/04/21 1051  ? finasteride (PROSCAR) tablet 5 mg  5 mg Oral Daily Kinsinger, Arta Bruce, MD   5 mg at 10/04/21 1053  ? fluticasone (FLONASE) 50 MCG/ACT nasal spray 1 spray  1 spray Each Nare Daily PRN Kinsinger, Arta Bruce, MD      ? gabapentin (NEURONTIN) capsule 200 mg  200 mg Oral Daily Kinsinger, Arta Bruce, MD   200 mg at 10/04/21 1054  ? And  ? gabapentin (NEURONTIN) capsule 100 mg  100 mg Oral QHS Kinsinger, Arta Bruce, MD   100 mg at 10/03/21 2204  ? HYDROcodone-acetaminophen (NORCO/VICODIN) 5-325 MG per tablet 1 tablet  1 tablet Oral Q4H  PRN Kinsinger, Arta Bruce, MD   1 tablet at 10/03/21 0017  ? HYDROmorphone (DILAUDID) injection 0.25 mg  0.25 mg Intravenous Q3H PRN Kinsinger, Arta Bruce, MD      ? irbesartan (AVAPRO) tablet 37.5 mg  37.5 mg Oral Daily Kinsinger, Arta Bruce, MD   37.5 mg at 10/04/21 1051  ? metFORMIN (GLUCOPHAGE-XR) 24 hr tablet 1,000 mg  1,000 mg Oral BID WC Kinsinger, Arta Bruce, MD   1,000 mg at 10/04/21 0827  ? metoprolol tartrate (LOPRESSOR) injection 5 mg  5 mg Intravenous Q6H PRN Kinsinger, Arta Bruce, MD      ? mupirocin ointment (BACTROBAN) 2 % 1 application.  1 application. Topical Daily Kinsinger, Arta Bruce, MD   1 application. at 10/04/21 1055  ? nicotine (NICODERM CQ - dosed in mg/24 hours) patch 21 mg  21 mg Transdermal Daily Kinsinger, Arta Bruce, MD   21 mg at 10/04/21 1059  ? ondansetron (ZOFRAN-ODT) disintegrating tablet 4 mg  4 mg Oral Q6H PRN Kinsinger, Arta Bruce, MD      ? Or  ? ondansetron (ZOFRAN) injection 4 mg  4 mg Intravenous Q6H PRN Kinsinger, Arta Bruce, MD      ? polyethylene glycol (MIRALAX / GLYCOLAX) packet 17 g  17 g Oral Daily PRN Kinsinger, Arta Bruce, MD      ? potassium citrate (UROCIT-K) SR tablet 10 mEq  10 mEq Oral TID WC Kinsinger, Arta Bruce, MD   10 mEq at 10/04/21 0827  ? revefenacin (YUPELRI) nebulizer solution 175 mcg  175 mcg Nebulization Daily Freddi Starr, MD   175 mcg at 10/04/21 0908  ? traMADol (ULTRAM) tablet 50 mg  50 mg Oral Q6H PRN Kinsinger, Arta Bruce, MD   50 mg at 10/03/21 1713  ? ? ? ?Discharge Medications: ?Please see discharge summary for a list of discharge medications. ? ?Relevant Imaging Results: ? ?Relevant Lab Results: ? ? ?Additional Information ?SSN: [id number] ? ?Sherie Don, LCSW ? ? ? ? ?

## 2021-10-05 DIAGNOSIS — R531 Weakness: Secondary | ICD-10-CM | POA: Diagnosis not present

## 2021-10-05 DIAGNOSIS — Z515 Encounter for palliative care: Secondary | ICD-10-CM | POA: Diagnosis not present

## 2021-10-05 DIAGNOSIS — R0602 Shortness of breath: Secondary | ICD-10-CM | POA: Diagnosis not present

## 2021-10-05 DIAGNOSIS — Z7189 Other specified counseling: Secondary | ICD-10-CM | POA: Diagnosis not present

## 2021-10-05 NOTE — Progress Notes (Signed)
?4 Days Post-Op  ? ?Chief Complaint/Subjective: ?Pain stable, still trouble with weaning O2 ? ?Objective: ?Vital signs in last 24 hours: ?Temp:  [98 ?F (36.7 ?C)-98.5 ?F (36.9 ?C)] 98.5 ?F (36.9 ?C) (04/15 5053) ?Pulse Rate:  [93-94] 94 (04/15 0514) ?Resp:  [14-18] 14 (04/15 0514) ?BP: (125-138)/(79-98) 129/79 (04/15 9767) ?SpO2:  [92 %-95 %] 95 % (04/15 0636) ?Last BM Date : 10/01/21 ?Intake/Output from previous day: ?04/14 0701 - 04/15 0700 ?In: 4 [P.O.:360; IV Piggyback:100] ?Out: 550 [Urine:550] ?Intake/Output this shift: ?No intake/output data recorded. ? ?PE: ?Gen: NAD ?Resp: non-labored on O2 [place] ?Abd: soft, incision c/d/i ? ?Lab Results:  ?No results for input(s): WBC, HGB, HCT, PLT in the last 72 hours. ? ?BMET ?No results for input(s): NA, K, CL, CO2, GLUCOSE, BUN, CREATININE, CALCIUM in the last 72 hours. ? ?PT/INR ?No results for input(s): LABPROT, INR in the last 72 hours. ?CMP  ?   ?Component Value Date/Time  ? NA 141 10/02/2021 0337  ? K 4.1 10/02/2021 0337  ? CL 107 10/02/2021 0337  ? CO2 26 10/02/2021 0337  ? GLUCOSE 108 (H) 10/02/2021 3419  ? BUN 22 10/02/2021 0337  ? CREATININE 0.85 10/02/2021 0337  ? CREATININE 1.00 08/19/2021 1105  ? CALCIUM 9.8 10/02/2021 0337  ? PROT 6.7 08/19/2021 1105  ? ALBUMIN 3.8 08/19/2021 1105  ? AST 13 (L) 08/19/2021 1105  ? ALT 6 08/19/2021 1105  ? ALKPHOS 106 08/19/2021 1105  ? BILITOT 0.5 08/19/2021 1105  ? GFRNONAA >60 10/02/2021 0337  ? GFRNONAA >60 08/19/2021 1105  ? GFRAA >60 01/30/2020 0854  ? ?Lipase  ?   ?Component Value Date/Time  ? LIPASE 22 01/15/2020 1704  ? ? ?Studies/Results: ?CT Angio Chest Pulmonary Embolism (PE) W or WO Contrast ? ?Result Date: 10/03/2021 ?CLINICAL DATA:  resp failure, post-op, rule out PE, ?pneumonia EXAM: CT ANGIOGRAPHY CHEST WITH CONTRAST TECHNIQUE: Multidetector CT imaging of the chest was performed using the standard protocol during bolus administration of intravenous contrast. Multiplanar CT image reconstructions and MIPs were  obtained to evaluate the vascular anatomy. RADIATION DOSE REDUCTION: This exam was performed according to the departmental dose-optimization program which includes automated exposure control, adjustment of the mA and/or kV according to patient size and/or use of iterative reconstruction technique. CONTRAST:  2mL OMNIPAQUE IOHEXOL 350 MG/ML SOLN COMPARISON:  None. FINDINGS: Cardiovascular: Satisfactory opacification of the pulmonary arteries to the segmental level. No evidence of pulmonary embolism. Normal heart size. No pericardial effusion. Calcific atherosclerosis of the aorta and coronary arteries Mediastinum/Nodes: Increased irregular left hilar soft tissue. Enlarged 1.8 cm AP window node. Unremarkable appearance of the visualized trachea and esophagus. Lungs/Pleura: Increasing irregular/nodular left-sided pleural thickening, loculated pleural fluid, including increased size of nodular component along the suture line. Increased/new patchy consolidation throughout the left lung. Right middle lobe atelectasis. Small right pleural effusion. Emphysema. No pneumothorax. Upper Abdomen: Cholelithiasis. Musculoskeletal: No acute findings. Review of the MIP images confirms the above findings. IMPRESSION: 1. No evidence of acute pulmonary embolism. 2. Increasing irregular/nodular left-sided pleural thickening, loculated left pleural fluid, enlarged AP window node, and hilar soft tissue, concerning for progressive malignancy in this patient with known lung cancer. 3. Increased/new patchy consolidation throughout the left lung, concerning for pneumonia. Differentiating pneumonia from malignancy is difficult and a follow-up chest CT after treatment may be helpful if clinically warranted. 4. Right middle lobe atelectasis and small right pleural effusion. 5. Aortic Atherosclerosis (ICD10-I70.0) and Emphysema (ICD10-J43.9). 6. Cholelithiasis. Electronically Signed   By: Margaretha Sheffield  M.D.   On: 10/03/2021 17:22  ? ?DG  CHEST PORT 1 VIEW ? ?Result Date: 10/03/2021 ?CLINICAL DATA:  Shortness of breath. EXAM: PORTABLE CHEST 1 VIEW COMPARISON:  July 29, 2021. FINDINGS: Stable cardiomediastinal silhouette. Stable left basilar atelectasis or scarring is noted with associated pleural thickening and volume loss. Mild mediastinal shift to the left is noted. Increased right basilar opacity is noted concerning for possible pneumonia or atelectasis. Bony thorax is unremarkable. IMPRESSION: Stable left basilar atelectasis or scarring is noted with associated pleural thickening and associated volume loss. Increased right basilar opacity is noted concerning for possible pneumonia or atelectasis. Electronically Signed   By: Marijo Conception M.D.   On: 10/03/2021 14:04   ? ?Anti-infectives: ?Anti-infectives (From admission, onward)  ? ? Start     Dose/Rate Route Frequency Ordered Stop  ? 10/04/21 1000  azithromycin (ZITHROMAX) tablet 250 mg       ?See Hyperspace for full Linked Orders Report.  ? 250 mg Oral Daily 10/03/21 1458 10/08/21 0959  ? 10/03/21 1600  cefTRIAXone (ROCEPHIN) 2 g in sodium chloride 0.9 % 100 mL IVPB       ? 2 g ?200 mL/hr over 30 Minutes Intravenous Every 24 hours 10/03/21 1506    ? 10/03/21 1545  azithromycin (ZITHROMAX) tablet 500 mg       ?See Hyperspace for full Linked Orders Report.  ? 500 mg Oral Daily 10/03/21 1458 10/03/21 1712  ? 10/01/21 0600  ceFAZolin (ANCEF) IVPB 2g/100 mL premix       ? 2 g ?200 mL/hr over 30 Minutes Intravenous On call to O.R. 10/01/21 0523 10/01/21 0745  ? ?  ? ? ?Assessment/Plan ? s/p Procedure(s): ?INCISIONAL HERNIA REPAIR WITH MESH 10/01/2021  ?-palliative care and pulmonology involved with plans for short term SNF ? ?FEN - diabetic diet ?VTE - lovenox ?ID - no issues ?Disposition - pending SNF placement ? ? LOS: 2 days  ? ?I reviewed last 24 h vitals and pain scores, last 48 h intake and output, last 24 h labs and trends, and last 24 h imaging results. ? ?This care required moderate level  of medical decision making.  ? ?Rosario Adie ?[facility] ?10/05/2021, 9:10 AM ?Please see Amion for pager number during day hours 7:00am-4:30pm or 7:00am -11:30am on weekends ? ? ?

## 2021-10-05 NOTE — Plan of Care (Signed)
  Problem: Education: Goal: Knowledge of General Education information will improve Description: Including pain rating scale, medication(s)/side effects and non-pharmacologic comfort measures Outcome: Progressing   Problem: Activity: Goal: Risk for activity intolerance will decrease Outcome: Progressing   

## 2021-10-05 NOTE — Progress Notes (Signed)
? ?                                                                                                                                                     ?                                                   ?Daily Progress Note  ? ?Patient Name: Chad Gomez       Date: 10/05/2021 ?DOB: [birth year]  Age: 76 y.o. MRN#: [id number] ?Attending Physician: Kinsinger, Arta Bruce, MD ?Primary Care Physician: [name], MD ?Admit Date: 10/01/2021 ? ?Reason for Consultation/Follow-up: Establishing goals of care ? ?Subjective: ?Awake alert resting in bed denies shortness of breath ?On supplemental oxygen ? ?Length of Stay: 2 ? ?Current Medications: ?Scheduled Meds:  ? acetaminophen  1,000 mg Oral Q6H  ? amLODipine  10 mg Oral Daily  ? arformoterol  15 mcg Nebulization BID  ? aspirin EC  81 mg Oral Daily  ? atorvastatin  40 mg Oral Daily  ? azithromycin  250 mg Oral Daily  ? budesonide (PULMICORT) nebulizer solution  0.5 mg Nebulization BID  ? Chlorhexidine Gluconate Cloth  6 each Topical Daily  ? cholecalciferol  1,000 Units Oral Daily  ? enoxaparin (LOVENOX) injection  40 mg Subcutaneous Q24H  ? ezetimibe  5 mg Oral Daily  ? finasteride  5 mg Oral Daily  ? gabapentin  200 mg Oral Daily  ? And  ? gabapentin  100 mg Oral QHS  ? irbesartan  37.5 mg Oral Daily  ? metFORMIN  1,000 mg Oral BID WC  ? mupirocin ointment  1 application. Topical Daily  ? nicotine  21 mg Transdermal Daily  ? potassium citrate  10 mEq Oral TID WC  ? revefenacin  175 mcg Nebulization Daily  ? ? ?Continuous Infusions: ? cefTRIAXone (ROCEPHIN)  IV 2 g (10/04/21 1646)  ? ? ?PRN Meds: ?albuterol, diphenhydrAMINE **OR** diphenhydrAMINE, fluticasone, HYDROcodone-acetaminophen, HYDROmorphone (DILAUDID) injection, metoprolol tartrate, ondansetron **OR** ondansetron (ZOFRAN) IV, polyethylene glycol, traMADol ? ?Physical Exam         ?Awake alert oriented ?Nonlabored respirations ?Abdomen not distended ?No focal deficits ?Regular ? ?Vital Signs: BP 129/79   Pulse  94   Temp 98.5 ?F (36.9 ?C) (Oral)   Resp 14   Ht 5\' 10"  (1.778 m)   Wt 65.8 kg   SpO2 95%   BMI 20.81 kg/m?  ?SpO2: SpO2: 95 % ?O2 Device: O2 Device: Nasal Cannula ?O2 Flow Rate: O2 Flow Rate (L/min): 4 L/min ? ?Intake/output summary:  ?Intake/Output Summary (Last 24 hours) at 10/05/2021 0923 ?Last data filed at 10/05/2021 0200 ?Gross per 24 hour  ?Intake 460 ml  ?  Output 550 ml  ?Net -90 ml  ? ?LBM: Last BM Date : 10/01/21 ?Baseline Weight: Weight: 65.8 kg ?Most recent weight: Weight: 65.8 kg ? ?     ?Palliative Assessment/Data: ? ? ? ? ? ?Patient Active Problem List  ? Diagnosis Date Noted  ? History of incisional hernia repair 10/03/2021  ? Incisional hernia 10/01/2021  ? PVD (peripheral vascular disease) ([place]) 08/29/2021  ? Cellulitis of chest wall 08/26/2021  ? Malignant pleural effusion 05/30/2021  ? Lung nodule 05/08/2021  ? Protein-calorie malnutrition, severe 12/28/2019  ? Leukopenia due to antineoplastic chemotherapy ([place]) 12/26/2019  ? Sepsis ([place]) 12/26/2019  ? Ventral hernia 12/26/2019  ? Chronic indwelling Foley catheter 12/26/2019  ? Encounter for antineoplastic immunotherapy 12/19/2019  ? Adenocarcinoma of left lung, stage 3 ([place]) 12/16/2019  ? Encounter for antineoplastic chemotherapy 11/30/2019  ? Goals of care, counseling/discussion 11/09/2019  ? Lung cancer, lingula ([place]) 11/01/2019  ? S/P partial lobectomy of lung 10/28/2019  ? Dyslipidemia 10/06/2019  ? Type 2 diabetes mellitus with complication, without long-term current use of insulin ([place]) 10/06/2019  ? Tobacco abuse 09/06/2019  ? Hypertension 09/06/2019  ? Nephrolithiasis 09/06/2019  ? Melanoma ([place]) 09/06/2019  ? Centrilobular emphysema ([place]) 05/23/2019  ? Stage 4 very severe COPD by GOLD classification ([place]) 05/23/2019  ? Atherosclerosis of native artery of both lower extremities with intermittent claudication ([place]) 05/26/2016  ? S/P aortobifemoral bypass surgery 05/26/2016  ? Ureteral stone with hydronephrosis 03/21/2014  ? Ulnar  neuropathy 02/07/2014  ? Compulsive tobacco user syndrome 02/07/2014  ? Allergic rhinitis 02/07/2014  ? ? ?Palliative Care Assessment & Plan  ? ?Patient Profile: ?  ? ?Assessment: ? 3M with stage IV adenocarcinoma of the lungs and severe COPD who was admitted 4/11 for elective umbilical hernia repair who developed new oxygen need since admission.  ? ? Patient has been seen by PCCM for acute hypoxic respiratory failure, has life limiting illness of stage IV adenocarcinoma of the lung, also has severe COPD.  Patient has been admitted for abdominal hernia repair.  He was not on home oxygen.  Patient to undergo treatment for possible pneumonia.  Also to undergo CT scan to rule out pulmonary embolism.  Nebulized breathing treatments as well as antibiotics are being attempted.  He might need home oxygen on discharge. ?  ?Palliative medicine consult has also been requested for CODE STATUS and broad goals of care discussions ? ?Recommendations/Plan: ?DNR/DNI ?Continue supplemental oxygen ?Continue antibiotics for possible pneumonia as per PCCM recommendations. ?Goals of care discussed.  Recommend skilled nursing facility for short-term rehabilitation attempt with palliative services following the patient at the SNF rehab, outpatient follow-up with his oncologist Dr. Earlie Server for further goals of care discussions.  At times, the patient states that he does not want any further chemotherapy.  On most recent imaging found to have likely cancer progression on CT scan of the chest that was done on 10-03-2021 which showed increasing irregular pleural thickening and hilar soft tissue concerning for progressive malignancy, patient with known lung cancer.  Patient will need hospice support after his SNF rehab stay and after his follow-up with his oncologist if he decides to forego any more chemotherapy and if he elects for a more comfort focused approach at that time. ?  ?Code Status: ? ?  ?Code Status Orders  ?(From admission,  onward)  ?  ? ? ?  ? ?  Start     Ordered  ? 10/04/21 1201  Do not attempt resuscitation (DNR)  Continuous       ?Question Answer Comment  ?In the event of cardiac or respiratory ARREST Do not call a ?code blue?   ?In the event of cardiac or respiratory ARREST Do not perform Intubation, CPR, defibrillation or ACLS   ?In the event of cardiac or respiratory ARREST Use medication by any route, position, wound care, and other measures to relive pain and suffering. May use oxygen, suction and manual treatment of airway obstruction as needed for comfort.   ?  ? 10/04/21 1201  ? ?  ?  ? ?  ? ?Code Status History   ? ? Date Active Date Inactive Code Status Order ID Comments User Context  ? 10/01/2021 1208 10/04/2021 1201 Full Code [id number]  Kinsinger, Arta Bruce, MD Inpatient  ? 01/15/2020 2125 01/20/2020 1728 Full Code [id number]  Donnamae Jude, MD ED  ? 12/26/2019 1810 01/02/2020 1834 Full Code [id number]  Donnamae Jude, MD Inpatient  ? 10/28/2019 1743 11/05/2019 1622 Full Code [id number]  Barrett, Lodema Hong, PA-C Inpatient  ? 09/06/2019 1501 09/07/2019 2119 Full Code [id number]  Donita Brooks, NP Inpatient  ? 12/28/2014 1350 01/04/2015 1629 Full Code [id number]  [name] PA-C Inpatient  ? 11/01/2014 1020 11/01/2014 1703 Full Code [id number]  Serafina Mitchell, MD Inpatient  ? 03/21/2014 1714 03/22/2014 1435 Full Code [id number]  Phebe Colla, MD ED  ? 01/27/2013 1351 01/28/2013 1322 Full Code [id number]  Irine Seal, MD Inpatient  ? ?  ? ? ?Prognosis: ? Guarded, could be less than 6 months.  ? ?Discharge Planning: ?[facility] for rehab with Palliative care service follow-up ? ?Care plan was discussed with  patient.  ? ?Thank you for allowing the Palliative Medicine Team to assist in the care of this patient. ? ? ?Time In:  9 Time Out: 9.15 Total Time 15 Prolonged Time Billed  no   ? ?   ?Greater than 50%  of this time was spent counseling and coordinating care related to the above assessment and plan. ? ?Loistine Chance, MD ? ?Please  contact Palliative Medicine Team phone at [phone] for questions and concerns.  ? ? ? ? ? ?

## 2021-10-05 NOTE — Progress Notes (Signed)
This nurse entered patient's room to ask if he would like to get out of bed to the chair before his lunch tray came. Patient stated he was not getting out of bed and that he was waiting for his discharge papers and paperwork to go to "that place." This nurse explained that last update per TOC was awaiting bed offers and patient couldn't be discharged until then. The patient told this nurse "ever heard of AMA? I can leave." This nurse explained to patient that he did have the right to leave against medical advice but he required oxygen and would not be safe to leave. As this nurse was offering to call wife, Stanton Kidney, she walked into the room and assured patient that he was not leaving. Patient offered scheduled meds and patient said "I don't need that stuff." This nurse informed patient to ask for medications if needed. Patient adjusted in bed and bed alarm in place. Wife remains at bedside.  ?

## 2021-10-06 NOTE — Progress Notes (Signed)
?  5 Days Post-Op  ? ?Chief Complaint/Subjective: ?Pain stable, on 4-5L O2 ? ?Objective: ?Vital signs in last 24 hours: ?Temp:  [97.7 ?F (36.5 ?C)-98.8 ?F (37.1 ?C)] 97.7 ?F (36.5 ?C) (04/16 5916) ?Pulse Rate:  [90-100] 98 (04/16 0609) ?Resp:  [18-20] 18 (04/16 3846) ?BP: (118-131)/(79-83) 131/82 (04/16 6599) ?SpO2:  [98 %] 98 % (04/16 0609) ?Last BM Date : 10/03/21 ?Intake/Output from previous day: ?04/15 0701 - 04/16 0700 ?In: 400 [P.O.:300; IV Piggyback:100] ?Out: 1000 [Urine:1000] ?Intake/Output this shift: ?No intake/output data recorded. ? ?PE: ?Gen: NAD ?Resp: non-labored on O2 [place] ?Abd: soft, incision c/d/i ? ?Lab Results:  ?No results for input(s): WBC, HGB, HCT, PLT in the last 72 hours. ? ?BMET ?No results for input(s): NA, K, CL, CO2, GLUCOSE, BUN, CREATININE, CALCIUM in the last 72 hours. ? ?PT/INR ?No results for input(s): LABPROT, INR in the last 72 hours. ?CMP  ?   ?Component Value Date/Time  ? NA 141 10/02/2021 0337  ? K 4.1 10/02/2021 0337  ? CL 107 10/02/2021 0337  ? CO2 26 10/02/2021 0337  ? GLUCOSE 108 (H) 10/02/2021 3570  ? BUN 22 10/02/2021 0337  ? CREATININE 0.85 10/02/2021 0337  ? CREATININE 1.00 08/19/2021 1105  ? CALCIUM 9.8 10/02/2021 0337  ? PROT 6.7 08/19/2021 1105  ? ALBUMIN 3.8 08/19/2021 1105  ? AST 13 (L) 08/19/2021 1105  ? ALT 6 08/19/2021 1105  ? ALKPHOS 106 08/19/2021 1105  ? BILITOT 0.5 08/19/2021 1105  ? GFRNONAA >60 10/02/2021 0337  ? GFRNONAA >60 08/19/2021 1105  ? GFRAA >60 01/30/2020 0854  ? ?Lipase  ?   ?Component Value Date/Time  ? LIPASE 22 01/15/2020 1704  ? ? ?Studies/Results: ?No results found. ? ?Anti-infectives: ?Anti-infectives (From admission, onward)  ? ? Start     Dose/Rate Route Frequency Ordered Stop  ? 10/04/21 1000  azithromycin (ZITHROMAX) tablet 250 mg       ?See Hyperspace for full Linked Orders Report.  ? 250 mg Oral Daily 10/03/21 1458 10/08/21 0959  ? 10/03/21 1600  cefTRIAXone (ROCEPHIN) 2 g in sodium chloride 0.9 % 100 mL IVPB       ? 2 g ?200 mL/hr  over 30 Minutes Intravenous Every 24 hours 10/03/21 1506    ? 10/03/21 1545  azithromycin (ZITHROMAX) tablet 500 mg       ?See Hyperspace for full Linked Orders Report.  ? 500 mg Oral Daily 10/03/21 1458 10/03/21 1712  ? 10/01/21 0600  ceFAZolin (ANCEF) IVPB 2g/100 mL premix       ? 2 g ?200 mL/hr over 30 Minutes Intravenous On call to O.R. 10/01/21 0523 10/01/21 0745  ? ?  ? ? ?Assessment/Plan ? s/p Procedure(s): ?INCISIONAL HERNIA REPAIR WITH MESH 10/01/2021  ?-palliative care and pulmonology involved with plans for short term SNF ? ?FEN - diabetic diet ?VTE - lovenox ?ID - no issues ?Disposition - pending SNF placement ? ? LOS: 3 days  ? ?I reviewed last 24 h vitals and pain scores, last 48 h intake and output, last 24 h labs and trends, and last 24 h imaging results. ? ?This care required straight forward level of medical decision making.  ? ?Rosario Adie ?[facility] ?10/06/2021, 8:07 AM ?Please see Amion for pager number during day hours 7:00am-4:30pm or 7:00am -11:30am on weekends ? ? ?

## 2021-10-06 NOTE — TOC Progression Note (Signed)
Transition of Care (TOC) - Progression Note  ? ? ?Patient Details  ?Name: Chad Gomez ?MRN: [id number] ?Date of Birth: 1945-08-26 ? ?Transition of Care (TOC) CM/SW Contact  ?Tawanna Cooler, RN ?Phone Number: ?10/06/2021, 1:17 PM ? ?Clinical Narrative:    ? ?Called patient's wife Stanton Kidney) to discuss bed offers.  Gave her the information for the 3 accepting facilities ([facility], [facility], and [facility]).  Stanton Kidney will review the 3 facilities and let TOC know her choice.   ?

## 2021-10-06 NOTE — Plan of Care (Signed)
?  Problem: Health Behavior/Discharge Planning: ?Goal: Ability to manage health-related needs will improve ?Outcome: Progressing ?  ?Problem: Coping: ?Goal: Level of anxiety will decrease ?Outcome: Progressing ?  ?Problem: Safety: ?Goal: Ability to remain free from injury will improve ?Outcome: Progressing ?  ?

## 2021-10-07 DIAGNOSIS — Z48815 Encounter for surgical aftercare following surgery on the digestive system: Secondary | ICD-10-CM | POA: Diagnosis not present

## 2021-10-07 DIAGNOSIS — R0902 Hypoxemia: Secondary | ICD-10-CM | POA: Diagnosis not present

## 2021-10-07 DIAGNOSIS — J910 Malignant pleural effusion: Secondary | ICD-10-CM | POA: Diagnosis not present

## 2021-10-07 DIAGNOSIS — I70213 Atherosclerosis of native arteries of extremities with intermittent claudication, bilateral legs: Secondary | ICD-10-CM | POA: Diagnosis not present

## 2021-10-07 DIAGNOSIS — R2689 Other abnormalities of gait and mobility: Secondary | ICD-10-CM | POA: Diagnosis not present

## 2021-10-07 DIAGNOSIS — J309 Allergic rhinitis, unspecified: Secondary | ICD-10-CM | POA: Diagnosis not present

## 2021-10-07 DIAGNOSIS — E43 Unspecified severe protein-calorie malnutrition: Secondary | ICD-10-CM | POA: Diagnosis not present

## 2021-10-07 DIAGNOSIS — I10 Essential (primary) hypertension: Secondary | ICD-10-CM | POA: Diagnosis not present

## 2021-10-07 DIAGNOSIS — F17200 Nicotine dependence, unspecified, uncomplicated: Secondary | ICD-10-CM | POA: Diagnosis not present

## 2021-10-07 DIAGNOSIS — I739 Peripheral vascular disease, unspecified: Secondary | ICD-10-CM | POA: Diagnosis not present

## 2021-10-07 DIAGNOSIS — L97929 Non-pressure chronic ulcer of unspecified part of left lower leg with unspecified severity: Secondary | ICD-10-CM | POA: Diagnosis not present

## 2021-10-07 DIAGNOSIS — Z1159 Encounter for screening for other viral diseases: Secondary | ICD-10-CM | POA: Diagnosis not present

## 2021-10-07 DIAGNOSIS — E559 Vitamin D deficiency, unspecified: Secondary | ICD-10-CM | POA: Diagnosis not present

## 2021-10-07 DIAGNOSIS — L97919 Non-pressure chronic ulcer of unspecified part of right lower leg with unspecified severity: Secondary | ICD-10-CM | POA: Diagnosis not present

## 2021-10-07 DIAGNOSIS — I700 Atherosclerosis of aorta: Secondary | ICD-10-CM | POA: Diagnosis not present

## 2021-10-07 DIAGNOSIS — J432 Centrilobular emphysema: Secondary | ICD-10-CM | POA: Diagnosis not present

## 2021-10-07 DIAGNOSIS — Z743 Need for continuous supervision: Secondary | ICD-10-CM | POA: Diagnosis not present

## 2021-10-07 DIAGNOSIS — J449 Chronic obstructive pulmonary disease, unspecified: Secondary | ICD-10-CM | POA: Diagnosis not present

## 2021-10-07 DIAGNOSIS — K432 Incisional hernia without obstruction or gangrene: Secondary | ICD-10-CM | POA: Diagnosis not present

## 2021-10-07 DIAGNOSIS — E118 Type 2 diabetes mellitus with unspecified complications: Secondary | ICD-10-CM | POA: Diagnosis not present

## 2021-10-07 DIAGNOSIS — Z8719 Personal history of other diseases of the digestive system: Secondary | ICD-10-CM | POA: Diagnosis not present

## 2021-10-07 DIAGNOSIS — G629 Polyneuropathy, unspecified: Secondary | ICD-10-CM | POA: Diagnosis not present

## 2021-10-07 DIAGNOSIS — R6889 Other general symptoms and signs: Secondary | ICD-10-CM | POA: Diagnosis not present

## 2021-10-07 DIAGNOSIS — C3492 Malignant neoplasm of unspecified part of left bronchus or lung: Secondary | ICD-10-CM | POA: Diagnosis not present

## 2021-10-07 DIAGNOSIS — N139 Obstructive and reflux uropathy, unspecified: Secondary | ICD-10-CM | POA: Diagnosis not present

## 2021-10-07 DIAGNOSIS — M6281 Muscle weakness (generalized): Secondary | ICD-10-CM | POA: Diagnosis not present

## 2021-10-07 DIAGNOSIS — E785 Hyperlipidemia, unspecified: Secondary | ICD-10-CM | POA: Diagnosis not present

## 2021-10-07 DIAGNOSIS — Z7401 Bed confinement status: Secondary | ICD-10-CM | POA: Diagnosis not present

## 2021-10-07 MED ORDER — NICOTINE 21 MG/24HR TD PT24
21.0000 mg | MEDICATED_PATCH | Freq: Every day | TRANSDERMAL | 0 refills | Status: DC
Start: 2021-10-08 — End: 2021-12-10

## 2021-10-07 NOTE — Progress Notes (Addendum)
Physical Therapy Treatment ?Patient Details ?Name: Chad Gomez ?MRN: [id number] ?DOB: 1945/07/27 ?Today's Date: 10/07/2021 ? ? ?History of Present Illness Pt is a 76yo male presenting s/p hernia repair on 10/01/21. PMH: CKD, COPD, DM, hyperlipidemia, HTN, PVD, hx of lung cancer, smoker. ? ?  ?PT Comments  ? ? Pt in bed at start of session today and expresses concerns about their condition and level of care; portions of the session were spent in listening to and reassuring the pt that his care team is communicating and providing the best possible care; pt verbalized understanding. Pt alert and oriented x4 but may exhibiting some confusion, RN notified. Encouraged pt to maintain positive attitude and view each day as a fresh start. Pt supervision for bed mobility and min guard for ambulation with RW for 54ft (2 bouts of 35ft each) and recliner follow for safety, SpO2 monitored throughout via pulse oximeter. Reinforced importance of mobilizing with all staff, not just physical therapy and pt verbalized understanding. Current discharge recommendation remains appropriate given pt's functional deficits. We will continue to follow pt to promote modified independence with mobility while the pt remains in acute care.  ?   ?Recommendations for follow up therapy are one component of a multi-disciplinary discharge planning process, led by the attending physician.  Recommendations may be updated based on patient status, additional functional criteria and insurance authorization. ? ?Follow Up Recommendations ? Skilled nursing-short term rehab (<3 hours/day) ?  ?  ?Assistance Recommended at Discharge Frequent or constant Supervision/Assistance  ?Patient can return home with the following Assistance with cooking/housework;Assist for transportation;Help with stairs or ramp for entrance;A lot of help with walking and/or transfers;A lot of help with bathing/dressing/bathroom ?  ?Equipment Recommendations ? Rolling walker (2 wheels)   ?  ?Recommendations for Other Services   ? ? ?  ?Precautions / Restrictions Precautions ?Precautions: Fall ?Restrictions ?Weight Bearing Restrictions: No  ?  ? ?Mobility ? Bed Mobility ?Overal bed mobility: Needs Assistance ?Bed Mobility: Sidelying to Sit, Rolling ?Rolling: Supervision ?Sidelying to sit: Supervision ?Supine to sit: Supervision ?  ?  ?General bed mobility comments: Pt supervision for bed mobility for safety, no physical assist required. ?  ? ?Transfers ?Overall transfer level: Needs assistance ?Equipment used: Rolling walker (2 wheels) ?Transfers: Sit to/from Stand ?Sit to Stand: Min guard ?  ?  ?  ?  ?  ?General transfer comment: Pt min guard for sit to stand and stand to sit transfer for safety and line management only, no physical assist required. Pt slightly impulsive and sat back to recliner without warning during ambulation task but without LOB. ?  ? ?Ambulation/Gait ?Ambulation/Gait assistance: Min guard, +2 safety/equipment ?Gait Distance (Feet): 40 Feet ?Assistive device: Rolling walker (2 wheels) ?Gait Pattern/deviations: Step-through pattern, Decreased stride length, Narrow base of support, Drifts right/left, Trunk flexed ?Gait velocity: decreased ?  ?  ?General Gait Details: Pt ambulated with RW 57ft with seated rest break, then ambulated an additional 40 ft; min guard level of assist for safety only, no physical assist required, recliner follow for safety. VSS, SpO2 monitored via handheld pulse oximeter 96% on 5L via [place]. Demonstrated step-through pattern with narrow BOS, drifting left, foward trunk lean. Pt continues to be limited by weakness and ability to use available oxygen for mobility, though the pt went further today than previous sessions. No overt LOB noted. ? ? ?Stairs ?  ?  ?  ?  ?  ? ? ?Wheelchair Mobility ?  ? ?Modified Rankin (Stroke Patients Only) ?  ? ? ?  ?  Balance Overall balance assessment: Needs assistance ?Sitting-balance support: Feet supported, No upper extremity  supported ?Sitting balance-Leahy Scale: Fair ?  ?  ?Standing balance support: Bilateral upper extremity supported, During functional activity, Reliant on assistive device for balance ?Standing balance-Leahy Scale: Poor ?  ?  ?  ?  ?  ?  ?  ?  ?  ?  ?  ?  ?  ? ?  ?Cognition Arousal/Alertness: Awake/alert ?Behavior During Therapy: [facility] for tasks assessed/performed ?Overall Cognitive Status: Impaired/Different from baseline ?  ?  ?  ?  ?  ?  ?  ?  ?  ?  ?  ?  ?  ?  ?  ?  ?General Comments: Pt seems to be more "down" about his status today, is alert and oriented x4 but may be exhibiting some confusion. RN notified. ?  ?  ? ?  ?Exercises Other Exercises ?Other Exercises: Educated pt about walking with mobility and nursing staff when not mobilizing with PT and use of RW during said mobility tasks; pt verbalized understanding. ?Other Exercises: ankle pumps, 20 ? ?  ?General Comments General comments (skin integrity, edema, etc.): Pt generally concerned with perception of lack of communication and continunity between providers during this hospital stay, reassured pt that we are all on his team and communicating to provide him the best care possible. Pt requested therapist provide additional information about past and current treatment session and pt was educated about walking oxygen testing as well as today's goals for therapy; pt veralized understanding and assent ?  ?  ? ?Pertinent Vitals/Pain Pain Assessment ?Pain Assessment: 0-10 ?Pain Score: 5  ?Breathing: occasional labored breathing, short period of hyperventilation ?Negative Vocalization: none ?Facial Expression: smiling or inexpressive ?Body Language: relaxed ?Consolability: no need to console ?PAINAD Score: 1 ?Pain Location: abdomen ?Pain Descriptors / Indicators: Operative site guarding, Discomfort ?Pain Intervention(s): Monitored during session, Repositioned  ? ? ?Home Living   ?  ?  ?  ?  ?  ?  ?  ?  ?  ?   ?  ?Prior Function    ?  ?  ?   ? ?PT Goals (current  goals can now be found in the care plan section) Acute Rehab PT Goals ?Patient Stated Goal: to reduce pain ?PT Goal Formulation: With patient ?Time For Goal Achievement: 10/15/21 ?Potential to Achieve Goals: Good ?Progress towards PT goals: Progressing toward goals ? ?  ?Frequency ? ? ? Min 2X/week ? ? ? ?  ?PT Plan Current plan remains appropriate  ? ? ?Co-evaluation   ?  ?  ?  ?  ? ?  ?AM-PAC PT "6 Clicks" Mobility   ?Outcome Measure ? Help needed turning from your back to your side while in a flat bed without using bedrails?: A Little ?Help needed moving from lying on your back to sitting on the side of a flat bed without using bedrails?: A Little ?Help needed moving to and from a bed to a chair (including a wheelchair)?: A Little ?Help needed standing up from a chair using your arms (e.g., wheelchair or bedside chair)?: A Little ?Help needed to walk in hospital room?: A Lot ?Help needed climbing 3-5 steps with a railing? : A Lot ?6 Click Score: 16 ? ?  ?End of Session Equipment Utilized During Treatment: Gait belt;Oxygen (5L via [place]) ?Activity Tolerance: Patient tolerated treatment well ?Patient left: in chair;with call bell/phone within reach;with chair alarm set ?Nurse Communication: Mobility status;Other (comment) (Pt's concerns about care  and DNR) ?PT Visit Diagnosis: Pain;Difficulty in walking, not elsewhere classified (R26.2);Muscle weakness (generalized) (M62.81) ?Pain - part of body:  (abdomen) ?  ? ? ?Time: 5427-0623 ?PT Time Calculation (min) (ACUTE ONLY): 31 min ? ?Charges:  $Gait Training: 23-37 mins          ?          ? ?Coolidge Breeze, PT, DPT ?[facility] ?Office: [phone] ?Pager: [phone] ? ? ?Coolidge Breeze ?10/07/2021, 10:54 AM ? ?

## 2021-10-07 NOTE — TOC Transition Note (Signed)
Transition of Care (TOC) - CM/SW Discharge Note ? ?Patient Details  ?Name: Chad Gomez ?MRN: [id number] ?Date of Birth: [birth year] ? ?Transition of Care (TOC) CM/SW Contact:  ?Sherie Don, LCSW ?Phone Number: ?10/07/2021, 12:53 PM ? ?Clinical Narrative: Patient is medically stable for discharge to SNF. Wife and patient chose [facility]. Insurance authorization completed on [employer] portal. Reference ID # is: [id number]. Patient has been approved for 10/07/2021-10/09/2021. Patient will go to room 121 and the number for report is [phone]. Discharge summary, discharge orders, and SNF transfer report faxed to facility in hub. ? ?Medical necessity form done; PTAR scheduled. Discharge packet completed. CSW notified wife of discharge. RN updated. TOC signing off. ? ?Final next level of care: [facility] ?Barriers to Discharge: [facility]) ? ?Patient Goals and CMS Choice ?Patient states their goals for this hospitalization and ongoing recovery are:: Go to short-term rehab ?CMS Medicare.gov Compare Post Acute Care list provided to:: Patient Represenative (must comment) ?Choice offered to / list presented to : Patient, Spouse ? ?Discharge Placement ?PASRR number recieved: 10/04/21      ?Patient [facility] bed at: [facility] ?Patient to be transferred to facility by: PTAR ?Name of family member notified: Caelen Reierson (wife) ?Patient and family notified of of transfer: 10/07/21 ? ?Discharge Plan and Services ?In-house Referral: Clinical Social Work ?Post Acute Care Choice: [facility]          ?DME Arranged: Walker rolling ?DME Agency: [employer] ?Date DME Agency Contacted: 10/02/21 ?Time DME Agency Contacted: 1423 ?Representative spoke with at DME Agency: Andee Poles ? ?Readmission Risk Interventions ?   ? View : No data to display.  ?  ?  ?  ? ?

## 2021-10-07 NOTE — Discharge Summary (Signed)
?Physician Discharge Summary  ?Chad Gomez FWY:[id number] DOB: 01-10-1946 DOA: 10/01/2021 ? ?PCP: [name], MD ? ?Admit date: 10/01/2021 ?Discharge date: 10/07/2021 ? ?Recommendations for Outpatient Follow-up:  ?Rolling walker, continue physical therapy (include homehealth, outpatient follow-up instructions, specific recommendations for PCP to follow-up on, etc.) ? ? ?Discharge Diagnoses:  ?Principal Problem: ?  Incisional hernia ?Active Problems: ?  History of incisional hernia repair ? ? ?Surgical Procedure: open incisional hernia repair with mesh ? ?Discharge Condition: Good ?Disposition: Home ? ?Diet recommendation: diabetic diet ? ? ?Hospital Course:  ?76 yo male underwent incisional hernia repair with mesh. Post operatively he was oxygen dependent and desaturating when trying to ambulate without oxygen. Pulmonology was consulted and subsequently palliative was involved. He underwent work up for hypoxia which showed no pneumonia or embolus. Decision was made for discharge to SNF. He had return of bowel function. Once everything was arranged he was discharged to SNF.  ? ?Discharge Instructions ? ?Discharge Instructions   ? ? Call MD for:  difficulty breathing, headache or visual disturbances   Complete by: As directed ?  ? Call MD for:  hives   Complete by: As directed ?  ? Call MD for:  persistant nausea and vomiting   Complete by: As directed ?  ? Call MD for:  redness, tenderness, or signs of infection (pain, swelling, redness, odor or green/yellow discharge around incision site)   Complete by: As directed ?  ? Call MD for:  severe uncontrolled pain   Complete by: As directed ?  ? Call MD for:  temperature >100.4   Complete by: As directed ?  ? Diet - low sodium heart healthy   Complete by: As directed ?  ? Discharge wound care:   Complete by: As directed ?  ? Ok to Games developer. Glue will likely peel off in 1-3 weeks. No bandage required  ? Driving Restrictions   Complete by: As directed ?  ? No  driving while on narcotics  ? For home use only DME 4 wheeled rolling walker with seat   Complete by: As directed ?  ? Patient needs a walker to treat with the following condition: Incisional hernia  ? Increase activity slowly   Complete by: As directed ?  ? Lifting restrictions   Complete by: As directed ?  ? No lifting greater than 20 pounds for 3 weeks  ? ?  ? ?Allergies as of 10/07/2021   ? ?   Reactions  ? Gabapentin Other (See Comments)  ? "Slurred speech in high doses"  ? Morphine And Related Anxiety, Other (See Comments)  ? Combative, incoherent    ? Tape Other (See Comments), Rash  ? Surgical tape ?Adhesive tape causes blisters  ? Allevyn Adhesive [wound Dressings] Other (See Comments)  ? blisters  ? Amoxicillin Other (See Comments)  ? Severe stomach pain  ? Naproxen Swelling  ? Eyes swell  ? ?  ? ?  ?Medication List  ?  ? ?TAKE these medications   ? ?acetaminophen 650 MG CR tablet ?Commonly known as: TYLENOL ?Take 1,300 mg by mouth every 8 (eight) hours as needed for pain. ?  ?acetaminophen 500 MG tablet ?Commonly known as: TYLENOL ?Take 2 tablets (1,000 mg total) by mouth every 6 (six) hours as needed for mild pain. ?  ?albuterol 108 (90 Base) MCG/ACT inhaler ?Commonly known as: VENTOLIN HFA ?Inhale 2 puffs into the lungs every 6 (six) hours as needed for wheezing or shortness of breath. ?  ?  amLODipine 10 MG tablet ?Commonly known as: NORVASC ?Take 10 mg by mouth daily. ?  ?aspirin EC 81 MG tablet ?Take 81 mg by mouth daily. ?  ?atorvastatin 40 MG tablet ?Commonly known as: LIPITOR ?Take 40 mg by mouth daily. ?  ?ezetimibe 10 MG tablet ?Commonly known as: ZETIA ?Take 5 mg by mouth daily. ?  ?finasteride 5 MG tablet ?Commonly known as: PROSCAR ?TAKE 1 TABLET BY MOUTH  DAILY ?  ?fluticasone 50 MCG/ACT nasal spray ?Commonly known as: FLONASE ?Place 1 spray into both nostrils daily as needed for allergies or rhinitis. ?  ?gabapentin 100 MG capsule ?Commonly known as: NEURONTIN ?Take 100-200 mg by mouth See  admin instructions. Take 200 mg by mouth in the morning and take 100 mg at bedtime ?  ?Glucerna Liqd ?Take 237 mLs by mouth daily. ?  ?metFORMIN 500 MG 24 hr tablet ?Commonly known as: GLUCOPHAGE-XR ?Take 1,000 mg by mouth 2 (two) times daily. Morning and evening ?  ?mupirocin ointment 2 % ?Commonly known as: BACTROBAN ?Apply 1 application topically daily. With dressing changes. ?  ?nicotine 21 mg/24hr patch ?Commonly known as: NICODERM CQ - dosed in mg/24 hours ?Place 1 patch (21 mg total) onto the skin daily. ?Start taking on: October 08, 2021 ?  ?olmesartan 40 MG tablet ?Commonly known as: BENICAR ?Take 40 mg by mouth daily. ?  ?potassium citrate 10 MEQ (1080 MG) SR tablet ?Commonly known as: UROCIT-K ?Take 10 mEq by mouth 3 (three) times daily with meals. ?  ?Stiolto Respimat 2.5-2.5 MCG/ACT Aers ?Generic drug: Tiotropium Bromide-Olodaterol ?Inhale 2 puffs into the lungs daily. ?  ?traMADol 50 MG tablet ?Commonly known as: ULTRAM ?Take 1 tablet (50 mg total) by mouth every 6 (six) hours as needed (mild pain). ?  ?Vitamin D-3 25 MCG (1000 UT) Caps ?Take 1,000 Units by mouth daily. ?  ? ?  ? ?  ?  ? ? ?  ?Durable Medical Equipment  ?(From admission, onward)  ?  ? ? ?  ? ?  Start     Ordered  ? 10/02/21 1029  For home use only DME Walker rolling  Once       ?Question Answer Comment  ?Walker: With 5 Inch Wheels   ?Patient needs a walker to treat with the following condition Surgery, elective   ?  ? 10/02/21 1028  ? 10/02/21 0000  For home use only DME 4 wheeled rolling walker with seat       ?Question:  Patient needs a walker to treat with the following condition  Answer:  Incisional hernia  ? 10/02/21 1220  ? ?  ?  ? ?  ? ? ?  ?Discharge Care Instructions  ?(From admission, onward)  ?  ? ? ?  ? ?  Start     Ordered  ? 10/02/21 0000  Discharge wound care:       ?Comments: Ok to Games developer. Glue will likely peel off in 1-3 weeks. No bandage required  ? 10/02/21 1218  ? ?  ?  ? ?  ? ? ? ? ?The results of  significant diagnostics from this hospitalization (including imaging, microbiology, ancillary and laboratory) are listed below for reference.   ? ?Significant Diagnostic Studies: ?CT Angio Chest Pulmonary Embolism (PE) W or WO Contrast ? ?Result Date: 10/03/2021 ?CLINICAL DATA:  resp failure, post-op, rule out PE, ?pneumonia EXAM: CT ANGIOGRAPHY CHEST WITH CONTRAST TECHNIQUE: Multidetector CT imaging of the chest was performed using the standard protocol during  bolus administration of intravenous contrast. Multiplanar CT image reconstructions and MIPs were obtained to evaluate the vascular anatomy. RADIATION DOSE REDUCTION: This exam was performed according to the departmental dose-optimization program which includes automated exposure control, adjustment of the mA and/or kV according to patient size and/or use of iterative reconstruction technique. CONTRAST:  36mL OMNIPAQUE IOHEXOL 350 MG/ML SOLN COMPARISON:  None. FINDINGS: Cardiovascular: Satisfactory opacification of the pulmonary arteries to the segmental level. No evidence of pulmonary embolism. Normal heart size. No pericardial effusion. Calcific atherosclerosis of the aorta and coronary arteries Mediastinum/Nodes: Increased irregular left hilar soft tissue. Enlarged 1.8 cm AP window node. Unremarkable appearance of the visualized trachea and esophagus. Lungs/Pleura: Increasing irregular/nodular left-sided pleural thickening, loculated pleural fluid, including increased size of nodular component along the suture line. Increased/new patchy consolidation throughout the left lung. Right middle lobe atelectasis. Small right pleural effusion. Emphysema. No pneumothorax. Upper Abdomen: Cholelithiasis. Musculoskeletal: No acute findings. Review of the MIP images confirms the above findings. IMPRESSION: 1. No evidence of acute pulmonary embolism. 2. Increasing irregular/nodular left-sided pleural thickening, loculated left pleural fluid, enlarged AP window node, and  hilar soft tissue, concerning for progressive malignancy in this patient with known lung cancer. 3. Increased/new patchy consolidation throughout the left lung, concerning for pneumonia. Differentiating pneumonia from

## 2021-10-08 DIAGNOSIS — J449 Chronic obstructive pulmonary disease, unspecified: Secondary | ICD-10-CM | POA: Diagnosis not present

## 2021-10-08 DIAGNOSIS — J309 Allergic rhinitis, unspecified: Secondary | ICD-10-CM | POA: Diagnosis not present

## 2021-10-08 DIAGNOSIS — J432 Centrilobular emphysema: Secondary | ICD-10-CM | POA: Diagnosis not present

## 2021-10-08 DIAGNOSIS — E118 Type 2 diabetes mellitus with unspecified complications: Secondary | ICD-10-CM | POA: Diagnosis not present

## 2021-10-08 DIAGNOSIS — M6281 Muscle weakness (generalized): Secondary | ICD-10-CM | POA: Diagnosis not present

## 2021-10-08 DIAGNOSIS — L97919 Non-pressure chronic ulcer of unspecified part of right lower leg with unspecified severity: Secondary | ICD-10-CM | POA: Diagnosis not present

## 2021-10-08 DIAGNOSIS — I739 Peripheral vascular disease, unspecified: Secondary | ICD-10-CM | POA: Diagnosis not present

## 2021-10-08 DIAGNOSIS — J910 Malignant pleural effusion: Secondary | ICD-10-CM | POA: Diagnosis not present

## 2021-10-08 DIAGNOSIS — N139 Obstructive and reflux uropathy, unspecified: Secondary | ICD-10-CM | POA: Diagnosis not present

## 2021-10-08 DIAGNOSIS — Z8719 Personal history of other diseases of the digestive system: Secondary | ICD-10-CM | POA: Diagnosis not present

## 2021-10-08 DIAGNOSIS — K432 Incisional hernia without obstruction or gangrene: Secondary | ICD-10-CM | POA: Diagnosis not present

## 2021-10-08 DIAGNOSIS — I70213 Atherosclerosis of native arteries of extremities with intermittent claudication, bilateral legs: Secondary | ICD-10-CM | POA: Diagnosis not present

## 2021-10-08 DIAGNOSIS — Z48815 Encounter for surgical aftercare following surgery on the digestive system: Secondary | ICD-10-CM | POA: Diagnosis not present

## 2021-10-08 DIAGNOSIS — L97929 Non-pressure chronic ulcer of unspecified part of left lower leg with unspecified severity: Secondary | ICD-10-CM | POA: Diagnosis not present

## 2021-10-10 DIAGNOSIS — J910 Malignant pleural effusion: Secondary | ICD-10-CM | POA: Diagnosis not present

## 2021-10-10 DIAGNOSIS — E118 Type 2 diabetes mellitus with unspecified complications: Secondary | ICD-10-CM | POA: Diagnosis not present

## 2021-10-10 DIAGNOSIS — Z48815 Encounter for surgical aftercare following surgery on the digestive system: Secondary | ICD-10-CM | POA: Diagnosis not present

## 2021-10-10 DIAGNOSIS — K432 Incisional hernia without obstruction or gangrene: Secondary | ICD-10-CM | POA: Diagnosis not present

## 2021-10-10 DIAGNOSIS — N139 Obstructive and reflux uropathy, unspecified: Secondary | ICD-10-CM | POA: Diagnosis not present

## 2021-10-10 DIAGNOSIS — J432 Centrilobular emphysema: Secondary | ICD-10-CM | POA: Diagnosis not present

## 2021-10-10 DIAGNOSIS — I70213 Atherosclerosis of native arteries of extremities with intermittent claudication, bilateral legs: Secondary | ICD-10-CM | POA: Diagnosis not present

## 2021-10-10 DIAGNOSIS — M6281 Muscle weakness (generalized): Secondary | ICD-10-CM | POA: Diagnosis not present

## 2021-10-10 DIAGNOSIS — J309 Allergic rhinitis, unspecified: Secondary | ICD-10-CM | POA: Diagnosis not present

## 2021-10-10 DIAGNOSIS — Z8719 Personal history of other diseases of the digestive system: Secondary | ICD-10-CM | POA: Diagnosis not present

## 2021-10-10 DIAGNOSIS — J449 Chronic obstructive pulmonary disease, unspecified: Secondary | ICD-10-CM | POA: Diagnosis not present

## 2021-10-10 DIAGNOSIS — I739 Peripheral vascular disease, unspecified: Secondary | ICD-10-CM | POA: Diagnosis not present

## 2021-10-11 DIAGNOSIS — Z8719 Personal history of other diseases of the digestive system: Secondary | ICD-10-CM | POA: Diagnosis not present

## 2021-10-11 DIAGNOSIS — J309 Allergic rhinitis, unspecified: Secondary | ICD-10-CM | POA: Diagnosis not present

## 2021-10-11 DIAGNOSIS — E118 Type 2 diabetes mellitus with unspecified complications: Secondary | ICD-10-CM | POA: Diagnosis not present

## 2021-10-11 DIAGNOSIS — I70213 Atherosclerosis of native arteries of extremities with intermittent claudication, bilateral legs: Secondary | ICD-10-CM | POA: Diagnosis not present

## 2021-10-11 DIAGNOSIS — J449 Chronic obstructive pulmonary disease, unspecified: Secondary | ICD-10-CM | POA: Diagnosis not present

## 2021-10-11 DIAGNOSIS — K432 Incisional hernia without obstruction or gangrene: Secondary | ICD-10-CM | POA: Diagnosis not present

## 2021-10-11 DIAGNOSIS — I739 Peripheral vascular disease, unspecified: Secondary | ICD-10-CM | POA: Diagnosis not present

## 2021-10-11 DIAGNOSIS — N139 Obstructive and reflux uropathy, unspecified: Secondary | ICD-10-CM | POA: Diagnosis not present

## 2021-10-11 DIAGNOSIS — Z48815 Encounter for surgical aftercare following surgery on the digestive system: Secondary | ICD-10-CM | POA: Diagnosis not present

## 2021-10-11 DIAGNOSIS — J910 Malignant pleural effusion: Secondary | ICD-10-CM | POA: Diagnosis not present

## 2021-10-11 DIAGNOSIS — M6281 Muscle weakness (generalized): Secondary | ICD-10-CM | POA: Diagnosis not present

## 2021-10-11 DIAGNOSIS — J432 Centrilobular emphysema: Secondary | ICD-10-CM | POA: Diagnosis not present

## 2021-10-15 DIAGNOSIS — L97919 Non-pressure chronic ulcer of unspecified part of right lower leg with unspecified severity: Secondary | ICD-10-CM | POA: Diagnosis not present

## 2021-10-15 DIAGNOSIS — K432 Incisional hernia without obstruction or gangrene: Secondary | ICD-10-CM | POA: Diagnosis not present

## 2021-10-15 DIAGNOSIS — I70213 Atherosclerosis of native arteries of extremities with intermittent claudication, bilateral legs: Secondary | ICD-10-CM | POA: Diagnosis not present

## 2021-10-15 DIAGNOSIS — N139 Obstructive and reflux uropathy, unspecified: Secondary | ICD-10-CM | POA: Diagnosis not present

## 2021-10-15 DIAGNOSIS — J910 Malignant pleural effusion: Secondary | ICD-10-CM | POA: Diagnosis not present

## 2021-10-15 DIAGNOSIS — M6281 Muscle weakness (generalized): Secondary | ICD-10-CM | POA: Diagnosis not present

## 2021-10-15 DIAGNOSIS — L97929 Non-pressure chronic ulcer of unspecified part of left lower leg with unspecified severity: Secondary | ICD-10-CM | POA: Diagnosis not present

## 2021-10-15 DIAGNOSIS — Z8719 Personal history of other diseases of the digestive system: Secondary | ICD-10-CM | POA: Diagnosis not present

## 2021-10-15 DIAGNOSIS — J309 Allergic rhinitis, unspecified: Secondary | ICD-10-CM | POA: Diagnosis not present

## 2021-10-15 DIAGNOSIS — I739 Peripheral vascular disease, unspecified: Secondary | ICD-10-CM | POA: Diagnosis not present

## 2021-10-15 DIAGNOSIS — J449 Chronic obstructive pulmonary disease, unspecified: Secondary | ICD-10-CM | POA: Diagnosis not present

## 2021-10-15 DIAGNOSIS — J432 Centrilobular emphysema: Secondary | ICD-10-CM | POA: Diagnosis not present

## 2021-10-15 DIAGNOSIS — E118 Type 2 diabetes mellitus with unspecified complications: Secondary | ICD-10-CM | POA: Diagnosis not present

## 2021-10-15 DIAGNOSIS — Z48815 Encounter for surgical aftercare following surgery on the digestive system: Secondary | ICD-10-CM | POA: Diagnosis not present

## 2021-10-18 ENCOUNTER — Ambulatory Visit: Payer: Medicare Other | Admitting: Cardiology

## 2021-10-18 DIAGNOSIS — J910 Malignant pleural effusion: Secondary | ICD-10-CM | POA: Diagnosis not present

## 2021-10-18 DIAGNOSIS — Z48815 Encounter for surgical aftercare following surgery on the digestive system: Secondary | ICD-10-CM | POA: Diagnosis not present

## 2021-10-18 DIAGNOSIS — Z8719 Personal history of other diseases of the digestive system: Secondary | ICD-10-CM | POA: Diagnosis not present

## 2021-10-18 DIAGNOSIS — E118 Type 2 diabetes mellitus with unspecified complications: Secondary | ICD-10-CM | POA: Diagnosis not present

## 2021-10-18 DIAGNOSIS — J449 Chronic obstructive pulmonary disease, unspecified: Secondary | ICD-10-CM | POA: Diagnosis not present

## 2021-10-18 DIAGNOSIS — M6281 Muscle weakness (generalized): Secondary | ICD-10-CM | POA: Diagnosis not present

## 2021-10-18 DIAGNOSIS — J432 Centrilobular emphysema: Secondary | ICD-10-CM | POA: Diagnosis not present

## 2021-10-18 DIAGNOSIS — I739 Peripheral vascular disease, unspecified: Secondary | ICD-10-CM | POA: Diagnosis not present

## 2021-10-18 DIAGNOSIS — K432 Incisional hernia without obstruction or gangrene: Secondary | ICD-10-CM | POA: Diagnosis not present

## 2021-10-18 DIAGNOSIS — N139 Obstructive and reflux uropathy, unspecified: Secondary | ICD-10-CM | POA: Diagnosis not present

## 2021-10-18 DIAGNOSIS — J309 Allergic rhinitis, unspecified: Secondary | ICD-10-CM | POA: Diagnosis not present

## 2021-10-18 DIAGNOSIS — I70213 Atherosclerosis of native arteries of extremities with intermittent claudication, bilateral legs: Secondary | ICD-10-CM | POA: Diagnosis not present

## 2021-10-21 DIAGNOSIS — Z1159 Encounter for screening for other viral diseases: Secondary | ICD-10-CM | POA: Diagnosis not present

## 2021-10-21 DIAGNOSIS — R2689 Other abnormalities of gait and mobility: Secondary | ICD-10-CM | POA: Diagnosis not present

## 2021-10-21 DIAGNOSIS — M6281 Muscle weakness (generalized): Secondary | ICD-10-CM | POA: Diagnosis not present

## 2021-10-21 DIAGNOSIS — K432 Incisional hernia without obstruction or gangrene: Secondary | ICD-10-CM | POA: Diagnosis not present

## 2021-10-22 DIAGNOSIS — M6281 Muscle weakness (generalized): Secondary | ICD-10-CM | POA: Diagnosis not present

## 2021-10-22 DIAGNOSIS — J910 Malignant pleural effusion: Secondary | ICD-10-CM | POA: Diagnosis not present

## 2021-10-22 DIAGNOSIS — I70213 Atherosclerosis of native arteries of extremities with intermittent claudication, bilateral legs: Secondary | ICD-10-CM | POA: Diagnosis not present

## 2021-10-22 DIAGNOSIS — J309 Allergic rhinitis, unspecified: Secondary | ICD-10-CM | POA: Diagnosis not present

## 2021-10-22 DIAGNOSIS — Z1159 Encounter for screening for other viral diseases: Secondary | ICD-10-CM | POA: Diagnosis not present

## 2021-10-22 DIAGNOSIS — Z48815 Encounter for surgical aftercare following surgery on the digestive system: Secondary | ICD-10-CM | POA: Diagnosis not present

## 2021-10-22 DIAGNOSIS — J432 Centrilobular emphysema: Secondary | ICD-10-CM | POA: Diagnosis not present

## 2021-10-22 DIAGNOSIS — R2689 Other abnormalities of gait and mobility: Secondary | ICD-10-CM | POA: Diagnosis not present

## 2021-10-22 DIAGNOSIS — N139 Obstructive and reflux uropathy, unspecified: Secondary | ICD-10-CM | POA: Diagnosis not present

## 2021-10-22 DIAGNOSIS — K432 Incisional hernia without obstruction or gangrene: Secondary | ICD-10-CM | POA: Diagnosis not present

## 2021-10-22 DIAGNOSIS — J449 Chronic obstructive pulmonary disease, unspecified: Secondary | ICD-10-CM | POA: Diagnosis not present

## 2021-10-22 DIAGNOSIS — I739 Peripheral vascular disease, unspecified: Secondary | ICD-10-CM | POA: Diagnosis not present

## 2021-10-22 DIAGNOSIS — Z8719 Personal history of other diseases of the digestive system: Secondary | ICD-10-CM | POA: Diagnosis not present

## 2021-10-22 DIAGNOSIS — E118 Type 2 diabetes mellitus with unspecified complications: Secondary | ICD-10-CM | POA: Diagnosis not present

## 2021-10-23 DIAGNOSIS — M6281 Muscle weakness (generalized): Secondary | ICD-10-CM | POA: Diagnosis not present

## 2021-10-23 DIAGNOSIS — J309 Allergic rhinitis, unspecified: Secondary | ICD-10-CM | POA: Diagnosis not present

## 2021-10-23 DIAGNOSIS — K432 Incisional hernia without obstruction or gangrene: Secondary | ICD-10-CM | POA: Diagnosis not present

## 2021-10-23 DIAGNOSIS — R2689 Other abnormalities of gait and mobility: Secondary | ICD-10-CM | POA: Diagnosis not present

## 2021-10-23 DIAGNOSIS — I739 Peripheral vascular disease, unspecified: Secondary | ICD-10-CM | POA: Diagnosis not present

## 2021-10-23 DIAGNOSIS — Z48815 Encounter for surgical aftercare following surgery on the digestive system: Secondary | ICD-10-CM | POA: Diagnosis not present

## 2021-10-23 DIAGNOSIS — N139 Obstructive and reflux uropathy, unspecified: Secondary | ICD-10-CM | POA: Diagnosis not present

## 2021-10-23 DIAGNOSIS — Z8719 Personal history of other diseases of the digestive system: Secondary | ICD-10-CM | POA: Diagnosis not present

## 2021-10-23 DIAGNOSIS — Z1159 Encounter for screening for other viral diseases: Secondary | ICD-10-CM | POA: Diagnosis not present

## 2021-10-23 DIAGNOSIS — E118 Type 2 diabetes mellitus with unspecified complications: Secondary | ICD-10-CM | POA: Diagnosis not present

## 2021-10-23 DIAGNOSIS — J449 Chronic obstructive pulmonary disease, unspecified: Secondary | ICD-10-CM | POA: Diagnosis not present

## 2021-10-23 DIAGNOSIS — I70213 Atherosclerosis of native arteries of extremities with intermittent claudication, bilateral legs: Secondary | ICD-10-CM | POA: Diagnosis not present

## 2021-10-23 DIAGNOSIS — J432 Centrilobular emphysema: Secondary | ICD-10-CM | POA: Diagnosis not present

## 2021-10-23 DIAGNOSIS — J910 Malignant pleural effusion: Secondary | ICD-10-CM | POA: Diagnosis not present

## 2021-10-24 DIAGNOSIS — K432 Incisional hernia without obstruction or gangrene: Secondary | ICD-10-CM | POA: Diagnosis not present

## 2021-10-24 DIAGNOSIS — E118 Type 2 diabetes mellitus with unspecified complications: Secondary | ICD-10-CM | POA: Diagnosis not present

## 2021-10-24 DIAGNOSIS — R2689 Other abnormalities of gait and mobility: Secondary | ICD-10-CM | POA: Diagnosis not present

## 2021-10-24 DIAGNOSIS — J432 Centrilobular emphysema: Secondary | ICD-10-CM | POA: Diagnosis not present

## 2021-10-24 DIAGNOSIS — I70213 Atherosclerosis of native arteries of extremities with intermittent claudication, bilateral legs: Secondary | ICD-10-CM | POA: Diagnosis not present

## 2021-10-24 DIAGNOSIS — J449 Chronic obstructive pulmonary disease, unspecified: Secondary | ICD-10-CM | POA: Diagnosis not present

## 2021-10-24 DIAGNOSIS — M6281 Muscle weakness (generalized): Secondary | ICD-10-CM | POA: Diagnosis not present

## 2021-10-24 DIAGNOSIS — Z1159 Encounter for screening for other viral diseases: Secondary | ICD-10-CM | POA: Diagnosis not present

## 2021-10-24 DIAGNOSIS — J309 Allergic rhinitis, unspecified: Secondary | ICD-10-CM | POA: Diagnosis not present

## 2021-10-24 DIAGNOSIS — N139 Obstructive and reflux uropathy, unspecified: Secondary | ICD-10-CM | POA: Diagnosis not present

## 2021-10-24 DIAGNOSIS — Z8719 Personal history of other diseases of the digestive system: Secondary | ICD-10-CM | POA: Diagnosis not present

## 2021-10-24 DIAGNOSIS — J910 Malignant pleural effusion: Secondary | ICD-10-CM | POA: Diagnosis not present

## 2021-10-24 DIAGNOSIS — I739 Peripheral vascular disease, unspecified: Secondary | ICD-10-CM | POA: Diagnosis not present

## 2021-10-24 DIAGNOSIS — Z48815 Encounter for surgical aftercare following surgery on the digestive system: Secondary | ICD-10-CM | POA: Diagnosis not present

## 2021-10-25 DIAGNOSIS — M6281 Muscle weakness (generalized): Secondary | ICD-10-CM | POA: Diagnosis not present

## 2021-10-25 DIAGNOSIS — J432 Centrilobular emphysema: Secondary | ICD-10-CM | POA: Diagnosis not present

## 2021-10-25 DIAGNOSIS — R2689 Other abnormalities of gait and mobility: Secondary | ICD-10-CM | POA: Diagnosis not present

## 2021-10-25 DIAGNOSIS — J449 Chronic obstructive pulmonary disease, unspecified: Secondary | ICD-10-CM | POA: Diagnosis not present

## 2021-10-25 DIAGNOSIS — J910 Malignant pleural effusion: Secondary | ICD-10-CM | POA: Diagnosis not present

## 2021-10-25 DIAGNOSIS — Z48815 Encounter for surgical aftercare following surgery on the digestive system: Secondary | ICD-10-CM | POA: Diagnosis not present

## 2021-10-25 DIAGNOSIS — Z1159 Encounter for screening for other viral diseases: Secondary | ICD-10-CM | POA: Diagnosis not present

## 2021-10-25 DIAGNOSIS — Z8719 Personal history of other diseases of the digestive system: Secondary | ICD-10-CM | POA: Diagnosis not present

## 2021-10-25 DIAGNOSIS — I739 Peripheral vascular disease, unspecified: Secondary | ICD-10-CM | POA: Diagnosis not present

## 2021-10-25 DIAGNOSIS — E118 Type 2 diabetes mellitus with unspecified complications: Secondary | ICD-10-CM | POA: Diagnosis not present

## 2021-10-25 DIAGNOSIS — N139 Obstructive and reflux uropathy, unspecified: Secondary | ICD-10-CM | POA: Diagnosis not present

## 2021-10-25 DIAGNOSIS — J309 Allergic rhinitis, unspecified: Secondary | ICD-10-CM | POA: Diagnosis not present

## 2021-10-25 DIAGNOSIS — I70213 Atherosclerosis of native arteries of extremities with intermittent claudication, bilateral legs: Secondary | ICD-10-CM | POA: Diagnosis not present

## 2021-10-25 DIAGNOSIS — K432 Incisional hernia without obstruction or gangrene: Secondary | ICD-10-CM | POA: Diagnosis not present

## 2021-10-26 DIAGNOSIS — M6281 Muscle weakness (generalized): Secondary | ICD-10-CM | POA: Diagnosis not present

## 2021-10-26 DIAGNOSIS — K432 Incisional hernia without obstruction or gangrene: Secondary | ICD-10-CM | POA: Diagnosis not present

## 2021-10-26 DIAGNOSIS — R2689 Other abnormalities of gait and mobility: Secondary | ICD-10-CM | POA: Diagnosis not present

## 2021-10-26 DIAGNOSIS — Z1159 Encounter for screening for other viral diseases: Secondary | ICD-10-CM | POA: Diagnosis not present

## 2021-10-26 DIAGNOSIS — N390 Urinary tract infection, site not specified: Secondary | ICD-10-CM | POA: Diagnosis not present

## 2021-10-27 DIAGNOSIS — M6281 Muscle weakness (generalized): Secondary | ICD-10-CM | POA: Diagnosis not present

## 2021-10-27 DIAGNOSIS — K432 Incisional hernia without obstruction or gangrene: Secondary | ICD-10-CM | POA: Diagnosis not present

## 2021-10-27 DIAGNOSIS — Z1159 Encounter for screening for other viral diseases: Secondary | ICD-10-CM | POA: Diagnosis not present

## 2021-10-27 DIAGNOSIS — R2689 Other abnormalities of gait and mobility: Secondary | ICD-10-CM | POA: Diagnosis not present

## 2021-10-28 DIAGNOSIS — R2689 Other abnormalities of gait and mobility: Secondary | ICD-10-CM | POA: Diagnosis not present

## 2021-10-28 DIAGNOSIS — Z1159 Encounter for screening for other viral diseases: Secondary | ICD-10-CM | POA: Diagnosis not present

## 2021-10-28 DIAGNOSIS — M6281 Muscle weakness (generalized): Secondary | ICD-10-CM | POA: Diagnosis not present

## 2021-10-28 DIAGNOSIS — K432 Incisional hernia without obstruction or gangrene: Secondary | ICD-10-CM | POA: Diagnosis not present

## 2021-10-29 DIAGNOSIS — N139 Obstructive and reflux uropathy, unspecified: Secondary | ICD-10-CM | POA: Diagnosis not present

## 2021-10-29 DIAGNOSIS — K432 Incisional hernia without obstruction or gangrene: Secondary | ICD-10-CM | POA: Diagnosis not present

## 2021-10-29 DIAGNOSIS — Z8719 Personal history of other diseases of the digestive system: Secondary | ICD-10-CM | POA: Diagnosis not present

## 2021-10-29 DIAGNOSIS — Z48815 Encounter for surgical aftercare following surgery on the digestive system: Secondary | ICD-10-CM | POA: Diagnosis not present

## 2021-10-29 DIAGNOSIS — R338 Other retention of urine: Secondary | ICD-10-CM | POA: Diagnosis not present

## 2021-10-29 DIAGNOSIS — Z1159 Encounter for screening for other viral diseases: Secondary | ICD-10-CM | POA: Diagnosis not present

## 2021-10-29 DIAGNOSIS — I70213 Atherosclerosis of native arteries of extremities with intermittent claudication, bilateral legs: Secondary | ICD-10-CM | POA: Diagnosis not present

## 2021-10-29 DIAGNOSIS — J910 Malignant pleural effusion: Secondary | ICD-10-CM | POA: Diagnosis not present

## 2021-10-29 DIAGNOSIS — I739 Peripheral vascular disease, unspecified: Secondary | ICD-10-CM | POA: Diagnosis not present

## 2021-10-29 DIAGNOSIS — J449 Chronic obstructive pulmonary disease, unspecified: Secondary | ICD-10-CM | POA: Diagnosis not present

## 2021-10-29 DIAGNOSIS — J309 Allergic rhinitis, unspecified: Secondary | ICD-10-CM | POA: Diagnosis not present

## 2021-10-29 DIAGNOSIS — J432 Centrilobular emphysema: Secondary | ICD-10-CM | POA: Diagnosis not present

## 2021-10-29 DIAGNOSIS — R2689 Other abnormalities of gait and mobility: Secondary | ICD-10-CM | POA: Diagnosis not present

## 2021-10-29 DIAGNOSIS — M6281 Muscle weakness (generalized): Secondary | ICD-10-CM | POA: Diagnosis not present

## 2021-10-29 DIAGNOSIS — E118 Type 2 diabetes mellitus with unspecified complications: Secondary | ICD-10-CM | POA: Diagnosis not present

## 2021-10-30 DIAGNOSIS — M6281 Muscle weakness (generalized): Secondary | ICD-10-CM | POA: Diagnosis not present

## 2021-10-30 DIAGNOSIS — Z1159 Encounter for screening for other viral diseases: Secondary | ICD-10-CM | POA: Diagnosis not present

## 2021-10-30 DIAGNOSIS — R2689 Other abnormalities of gait and mobility: Secondary | ICD-10-CM | POA: Diagnosis not present

## 2021-10-30 DIAGNOSIS — K432 Incisional hernia without obstruction or gangrene: Secondary | ICD-10-CM | POA: Diagnosis not present

## 2021-10-31 DIAGNOSIS — Z1159 Encounter for screening for other viral diseases: Secondary | ICD-10-CM | POA: Diagnosis not present

## 2021-10-31 DIAGNOSIS — R2689 Other abnormalities of gait and mobility: Secondary | ICD-10-CM | POA: Diagnosis not present

## 2021-10-31 DIAGNOSIS — K432 Incisional hernia without obstruction or gangrene: Secondary | ICD-10-CM | POA: Diagnosis not present

## 2021-10-31 DIAGNOSIS — M6281 Muscle weakness (generalized): Secondary | ICD-10-CM | POA: Diagnosis not present

## 2021-11-01 DIAGNOSIS — K432 Incisional hernia without obstruction or gangrene: Secondary | ICD-10-CM | POA: Diagnosis not present

## 2021-11-01 DIAGNOSIS — R2689 Other abnormalities of gait and mobility: Secondary | ICD-10-CM | POA: Diagnosis not present

## 2021-11-01 DIAGNOSIS — Z1159 Encounter for screening for other viral diseases: Secondary | ICD-10-CM | POA: Diagnosis not present

## 2021-11-01 DIAGNOSIS — M6281 Muscle weakness (generalized): Secondary | ICD-10-CM | POA: Diagnosis not present

## 2021-11-03 DIAGNOSIS — Z1159 Encounter for screening for other viral diseases: Secondary | ICD-10-CM | POA: Diagnosis not present

## 2021-11-03 DIAGNOSIS — R2689 Other abnormalities of gait and mobility: Secondary | ICD-10-CM | POA: Diagnosis not present

## 2021-11-03 DIAGNOSIS — K432 Incisional hernia without obstruction or gangrene: Secondary | ICD-10-CM | POA: Diagnosis not present

## 2021-11-03 DIAGNOSIS — M6281 Muscle weakness (generalized): Secondary | ICD-10-CM | POA: Diagnosis not present

## 2021-11-04 DIAGNOSIS — R2689 Other abnormalities of gait and mobility: Secondary | ICD-10-CM | POA: Diagnosis not present

## 2021-11-04 DIAGNOSIS — K432 Incisional hernia without obstruction or gangrene: Secondary | ICD-10-CM | POA: Diagnosis not present

## 2021-11-04 DIAGNOSIS — Z1159 Encounter for screening for other viral diseases: Secondary | ICD-10-CM | POA: Diagnosis not present

## 2021-11-04 DIAGNOSIS — M6281 Muscle weakness (generalized): Secondary | ICD-10-CM | POA: Diagnosis not present

## 2021-11-05 DIAGNOSIS — M6281 Muscle weakness (generalized): Secondary | ICD-10-CM | POA: Diagnosis not present

## 2021-11-05 DIAGNOSIS — Z1159 Encounter for screening for other viral diseases: Secondary | ICD-10-CM | POA: Diagnosis not present

## 2021-11-05 DIAGNOSIS — R2689 Other abnormalities of gait and mobility: Secondary | ICD-10-CM | POA: Diagnosis not present

## 2021-11-05 DIAGNOSIS — K432 Incisional hernia without obstruction or gangrene: Secondary | ICD-10-CM | POA: Diagnosis not present

## 2021-11-06 DIAGNOSIS — R2689 Other abnormalities of gait and mobility: Secondary | ICD-10-CM | POA: Diagnosis not present

## 2021-11-06 DIAGNOSIS — Z1159 Encounter for screening for other viral diseases: Secondary | ICD-10-CM | POA: Diagnosis not present

## 2021-11-06 DIAGNOSIS — M6281 Muscle weakness (generalized): Secondary | ICD-10-CM | POA: Diagnosis not present

## 2021-11-06 DIAGNOSIS — K432 Incisional hernia without obstruction or gangrene: Secondary | ICD-10-CM | POA: Diagnosis not present

## 2021-11-07 DIAGNOSIS — Z1159 Encounter for screening for other viral diseases: Secondary | ICD-10-CM | POA: Diagnosis not present

## 2021-11-07 DIAGNOSIS — M6281 Muscle weakness (generalized): Secondary | ICD-10-CM | POA: Diagnosis not present

## 2021-11-07 DIAGNOSIS — K432 Incisional hernia without obstruction or gangrene: Secondary | ICD-10-CM | POA: Diagnosis not present

## 2021-11-07 DIAGNOSIS — R2689 Other abnormalities of gait and mobility: Secondary | ICD-10-CM | POA: Diagnosis not present

## 2021-11-08 DIAGNOSIS — R2689 Other abnormalities of gait and mobility: Secondary | ICD-10-CM | POA: Diagnosis not present

## 2021-11-08 DIAGNOSIS — M6281 Muscle weakness (generalized): Secondary | ICD-10-CM | POA: Diagnosis not present

## 2021-11-08 DIAGNOSIS — K432 Incisional hernia without obstruction or gangrene: Secondary | ICD-10-CM | POA: Diagnosis not present

## 2021-11-08 DIAGNOSIS — Z1159 Encounter for screening for other viral diseases: Secondary | ICD-10-CM | POA: Diagnosis not present

## 2021-11-09 DIAGNOSIS — M6281 Muscle weakness (generalized): Secondary | ICD-10-CM | POA: Diagnosis not present

## 2021-11-09 DIAGNOSIS — R2689 Other abnormalities of gait and mobility: Secondary | ICD-10-CM | POA: Diagnosis not present

## 2021-11-09 DIAGNOSIS — Z1159 Encounter for screening for other viral diseases: Secondary | ICD-10-CM | POA: Diagnosis not present

## 2021-11-09 DIAGNOSIS — K432 Incisional hernia without obstruction or gangrene: Secondary | ICD-10-CM | POA: Diagnosis not present

## 2021-11-11 DIAGNOSIS — Z1159 Encounter for screening for other viral diseases: Secondary | ICD-10-CM | POA: Diagnosis not present

## 2021-11-11 DIAGNOSIS — M6281 Muscle weakness (generalized): Secondary | ICD-10-CM | POA: Diagnosis not present

## 2021-11-11 DIAGNOSIS — R2689 Other abnormalities of gait and mobility: Secondary | ICD-10-CM | POA: Diagnosis not present

## 2021-11-11 DIAGNOSIS — K432 Incisional hernia without obstruction or gangrene: Secondary | ICD-10-CM | POA: Diagnosis not present

## 2021-11-12 DIAGNOSIS — Z1159 Encounter for screening for other viral diseases: Secondary | ICD-10-CM | POA: Diagnosis not present

## 2021-11-12 DIAGNOSIS — R2689 Other abnormalities of gait and mobility: Secondary | ICD-10-CM | POA: Diagnosis not present

## 2021-11-12 DIAGNOSIS — M6281 Muscle weakness (generalized): Secondary | ICD-10-CM | POA: Diagnosis not present

## 2021-11-12 DIAGNOSIS — K432 Incisional hernia without obstruction or gangrene: Secondary | ICD-10-CM | POA: Diagnosis not present

## 2021-11-13 DIAGNOSIS — R2689 Other abnormalities of gait and mobility: Secondary | ICD-10-CM | POA: Diagnosis not present

## 2021-11-13 DIAGNOSIS — M6281 Muscle weakness (generalized): Secondary | ICD-10-CM | POA: Diagnosis not present

## 2021-11-13 DIAGNOSIS — K432 Incisional hernia without obstruction or gangrene: Secondary | ICD-10-CM | POA: Diagnosis not present

## 2021-11-13 DIAGNOSIS — Z1159 Encounter for screening for other viral diseases: Secondary | ICD-10-CM | POA: Diagnosis not present

## 2021-11-14 DIAGNOSIS — Z1159 Encounter for screening for other viral diseases: Secondary | ICD-10-CM | POA: Diagnosis not present

## 2021-11-14 DIAGNOSIS — E43 Unspecified severe protein-calorie malnutrition: Secondary | ICD-10-CM | POA: Diagnosis not present

## 2021-11-14 DIAGNOSIS — M6281 Muscle weakness (generalized): Secondary | ICD-10-CM | POA: Diagnosis not present

## 2021-11-14 DIAGNOSIS — K432 Incisional hernia without obstruction or gangrene: Secondary | ICD-10-CM | POA: Diagnosis not present

## 2021-11-14 DIAGNOSIS — Z8719 Personal history of other diseases of the digestive system: Secondary | ICD-10-CM | POA: Diagnosis not present

## 2021-11-14 DIAGNOSIS — R1084 Generalized abdominal pain: Secondary | ICD-10-CM | POA: Diagnosis not present

## 2021-11-14 DIAGNOSIS — R2689 Other abnormalities of gait and mobility: Secondary | ICD-10-CM | POA: Diagnosis not present

## 2021-11-15 DIAGNOSIS — E43 Unspecified severe protein-calorie malnutrition: Secondary | ICD-10-CM | POA: Diagnosis not present

## 2021-11-15 DIAGNOSIS — M6281 Muscle weakness (generalized): Secondary | ICD-10-CM | POA: Diagnosis not present

## 2021-11-15 DIAGNOSIS — N179 Acute kidney failure, unspecified: Secondary | ICD-10-CM | POA: Diagnosis not present

## 2021-11-15 DIAGNOSIS — Z1159 Encounter for screening for other viral diseases: Secondary | ICD-10-CM | POA: Diagnosis not present

## 2021-11-15 DIAGNOSIS — E860 Dehydration: Secondary | ICD-10-CM | POA: Diagnosis not present

## 2021-11-15 DIAGNOSIS — R2689 Other abnormalities of gait and mobility: Secondary | ICD-10-CM | POA: Diagnosis not present

## 2021-11-15 DIAGNOSIS — E875 Hyperkalemia: Secondary | ICD-10-CM | POA: Diagnosis not present

## 2021-11-15 DIAGNOSIS — K432 Incisional hernia without obstruction or gangrene: Secondary | ICD-10-CM | POA: Diagnosis not present

## 2021-11-15 DIAGNOSIS — I10 Essential (primary) hypertension: Secondary | ICD-10-CM | POA: Diagnosis not present

## 2021-11-19 DIAGNOSIS — E875 Hyperkalemia: Secondary | ICD-10-CM | POA: Diagnosis not present

## 2021-11-19 DIAGNOSIS — N179 Acute kidney failure, unspecified: Secondary | ICD-10-CM | POA: Diagnosis not present

## 2021-11-19 DIAGNOSIS — E43 Unspecified severe protein-calorie malnutrition: Secondary | ICD-10-CM | POA: Diagnosis not present

## 2021-11-19 DIAGNOSIS — E860 Dehydration: Secondary | ICD-10-CM | POA: Diagnosis not present

## 2021-11-19 DIAGNOSIS — I493 Ventricular premature depolarization: Secondary | ICD-10-CM | POA: Insufficient documentation

## 2021-11-19 NOTE — Progress Notes (Deleted)
Cardiology Office Note   Date:  11/19/2021   ID:  Chad Gomez 08/08/1945, MRN [id number]  PCP:  Chad Pretty, MD  Cardiologist:   Chad Breeding, MD Referring:  Chad Mons, MD  No chief complaint on file.     History of Present Illness: Chad Gomez is a 76 y.o. male who was sent by Chad Gomez for preop clearance prior to resection of a small cell lung cancer.  He is seen by Chad Gomez for evaluation of severe COPD.    In April he had abdominal incisional hernia repair with post op respiratory failure. I have reviewed the patient's medical history in detail and updated the computerized patient record.  ***   ***  Since I last saw him preop last year and he had a negative Lexiscan Myoview.  He has stage IIIa non-small cell lung cancer of the left upper lobe with metastatic  lymphadenopathy.  He had wedge resection robotic assisted.  He did not tolerate chemotherapy and is currently off any other therapies.  He says he is feeling well.  He denies any cardiovascular symptoms.  He is not very active but with his levels of activity he denies any chest discomfort, neck or arm discomfort.  He has some mild cough but is not having any new shortness of breath, PND or orthopnea.  He is not having any new palpitations, presyncope or syncope.  He has had no weight gain or edema.   Past Medical History:  Diagnosis Date   BPH (benign prostatic hypertrophy)    weak stream   Chronic kidney disease    Complication of anesthesia    urinary retention   COPD (chronic obstructive pulmonary disease) (HCC)    Diabetes mellitus without complication (HCC)    Type II   History of bladder stone    History of colon polyps    08/2013   pre-canerous   History of kidney stones    History of melanoma excision    LEFT EAR   Hyperlipidemia    Hypertension    Iron deficiency    Lung cancer, lingula ([place]) 11/01/2019   Open wound    left lower jaw; followed by ENT   Peripheral vascular  disease ([place])    Right ureteral stone    SBO (small bowel obstruction) ([place]) 12/26/2019   Shingles 05-15-2015    Past Surgical History:  Procedure Laterality Date   ABDOMINAL AORTAGRAM  11/01/2014   Procedure: Abdominal Aortagram;  Surgeon: Serafina Mitchell, MD;  Location: [place] [facility];  Service: Cardiovascular;;   AORTA - BILATERAL FEMORAL ARTERY BYPASS GRAFT N/A 12/28/2014   Procedure: AORTOBIFEMORAL BYPASS GRAFT;  Surgeon: Serafina Mitchell, MD;  Location: [place];  Service: Vascular;  Laterality: N/A;   BRONCHIAL BRUSHINGS  09/06/2019   Procedure: BRONCHIAL BRUSHINGS;  Surgeon: Collene Gobble, MD;  Location: [facility] ENDOSCOPY;  Service: Pulmonary;;  left upper lobe nodule   BRONCHIAL WASHINGS  09/06/2019   Procedure: BRONCHIAL WASHINGS;  Surgeon: Collene Gobble, MD;  Location: [facility] ENDOSCOPY;  Service: Pulmonary;;   CHEST TUBE INSERTION N/A 06/20/2021   Procedure: INSERTION PLEURAL DRAINAGE CATHETER;  Surgeon: Margaretha Seeds, MD;  Location: [facility];  Service: Cardiopulmonary;  Laterality: N/A;   COLONOSCOPY W/ POLYPECTOMY  09-13-2013   CYSTOLITHALOPAXY OF BLADDER STONE/ TRANSRECTAL ULTRASOUND PROSTATE BX  08-19-2005   CYSTOSCOPY W/ RETROGRADES Right 12/02/2012   Procedure: CYSTOSCOPY WITH RETROGRADE PYELOGRAM;  Surgeon: Malka So, MD;  Location: [place];  Service: Urology;  Laterality: Right;   CYSTOSCOPY W/ RETROGRADES Bilateral 10/13/2013   Procedure: CYSTOSCOPY WITH RETROGRADE PYELOGRAM;  Surgeon: Irine Seal, MD;  Location: [facility];  Service: Urology;  Laterality: Bilateral;   CYSTOSCOPY W/ URETERAL STENT PLACEMENT Left 04/11/2014   Procedure: CYSTOSCOPY WITH STENT REPLACEMENT;  Surgeon: Malka So, MD;  Location: [facility];  Service: Urology;  Laterality: Left;   CYSTOSCOPY WITH LITHOLAPAXY Right 12/02/2012   Procedure: CYSTOSCOPY WITH stone extraction;  Surgeon: Malka So, MD;  Location: [facility];  Service:  Urology;  Laterality: Right;   CYSTOSCOPY WITH LITHOLAPAXY Right 10/13/2013   Procedure: CYSTOSCOPY WITH LITHOLAPAXY, removal of urethral stone with basket;  Surgeon: Irine Seal, MD;  Location: [facility];  Service: Urology;  Laterality: Right;   CYSTOSCOPY WITH RETROGRADE PYELOGRAM, URETEROSCOPY AND STENT PLACEMENT Left 03/21/2014   Procedure: CYSTOSCOPY WITH RETROGRADE PYELOGRAM, AND LEFT STENT PLACEMENT;  Surgeon: Alexis Frock, MD;  Location: [facility];  Service: Urology;  Laterality: Left;   CYSTOSCOPY WITH RETROGRADE PYELOGRAM, URETEROSCOPY AND STENT PLACEMENT  09/09/2016   Procedure: CYSTOSCOPY WITH RIGHT  RETROGRADE PYELOGRAM, URETEROSCOPY WITH LASER BASKET EXTRACTION OF STONES  AND STENT PLACEMENT;  Surgeon: Irine Seal, MD;  Location: [facility];  Service: Urology;;   CYSTOSCOPY WITH URETEROSCOPY Left 04/11/2014   Procedure: CYSTOSCOPY WITH URETEROSCOPY/ STONE EXTRACTION;  Surgeon: Malka So, MD;  Location: [facility];  Service: Urology;  Laterality: Left;   FINE NEEDLE ASPIRATION  09/06/2019   Procedure: FINE NEEDLE ASPIRATION;  Surgeon: Collene Gobble, MD;  Location: [facility] ENDOSCOPY;  Service: Pulmonary;;  left upper lobe - lung   HOLMIUM LASER APPLICATION Right 8/46/9629   Procedure: HOLMIUM LASER APPLICATION;  Surgeon: Malka So, MD;  Location: [facility];  Service: Urology;  Laterality: Right;   HOLMIUM LASER APPLICATION Left 52/84/1324   Procedure: HOLMIUM LASER APPLICATION;  Surgeon: Malka So, MD;  Location: [facility];  Service: Urology;  Laterality: Left;   HOLMIUM LASER APPLICATION Right 09/22/270   Procedure: HOLMIUM LASER APPLICATION;  Surgeon: Irine Seal, MD;  Location: [facility];  Service: Urology;  Laterality: Right;   INCISIONAL HERNIA REPAIR N/A 10/01/2021   Procedure: INCISIONAL HERNIA REPAIR WITH MESH;  Surgeon: Kinsinger, Arta Bruce, MD;  Location[facility];  Service:  General;  Laterality: N/A;   INGUINAL HERNIA REPAIR Right 1954   INTERCOSTAL NERVE BLOCK Left 10/28/2019   Procedure: Intercostal Nerve Block;  Surgeon: [name], MD;  Location: [place];  Service: Thoracic;  Laterality: Left;   LITHOTRIPSY  2007 & 02/2013   LUNG BIOPSY  09/06/2019   Procedure: LUNG BIOPSY;  Surgeon: Collene Gobble, MD;  Location: [facility] ENDOSCOPY;  Service: Pulmonary;;  left upper lob lung   MANDIBLE FRACTURE SURGERY  1995   MICROLARYNGOSCOPY Right 02/18/2016   Procedure: MICRO DIRECT LARYNGOSCOPY EXCISIONAL BIOPSY OF RIGHT VOCAL CORD MASS;  Surgeon: Leta Baptist, MD;  Location: [place];  Service: ENT;  Laterality: Right;   [place]   left ear   MULTIPLE TOOTH [place]   NEPHROLITHOTOMY Left 01/27/2013   Procedure: NEPHROLITHOTOMY PERCUTANEOUS FIRST LOOK;  Surgeon: Malka So, MD;  [facility];  Service: Urology;  Laterality: Left;   NODE DISSECTION  10/28/2019   Procedure: Node Dissection;  Surgeon: [name], MD;  Location: [place];  Service: Thoracic;;  PERIPHERAL VASCULAR CATHETERIZATION N/A 11/01/2014   Procedure: Lower Extremity Angiography;  Surgeon: Serafina Mitchell, MD;  Location: [place] [facility];  Service: Cardiovascular;  Laterality: N/A;   URETEROSCOPY Right 12/02/2012   Procedure: RIGHT URETEROSCOPY STONE EXTRACTION WITH  STENT PLACEMENT;  Surgeon: Malka So, MD;  Location: [facility];  Service: Urology;  Laterality: Right;   VIDEO BRONCHOSCOPY  10/28/2019   VIDEO BRONCHOSCOPY WITH ENDOBRONCHIAL NAVIGATION WITH LUNG MARKINGS (N/A   VIDEO BRONCHOSCOPY WITH ENDOBRONCHIAL NAVIGATION N/A 09/06/2019   Procedure: VIDEO BRONCHOSCOPY WITH ENDOBRONCHIAL NAVIGATION;  Surgeon: Collene Gobble, MD;  Location: [place] [facility];  Service: Pulmonary;  Laterality: N/A;   VIDEO BRONCHOSCOPY WITH ENDOBRONCHIAL NAVIGATION N/A 10/28/2019   Procedure: VIDEO BRONCHOSCOPY WITH ENDOBRONCHIAL NAVIGATION WITH LUNG MARKINGS;   Surgeon: [name], MD;  Location: [facility];  Service: Thoracic;  Laterality: N/A;   WEDGE RESECTION  10/28/2019   LEFT UPPER LOBE     Current Outpatient Medications  Medication Sig Dispense Refill   acetaminophen (TYLENOL) 500 MG tablet Take 2 tablets (1,000 mg total) by mouth every 6 (six) hours as needed for mild pain. (Patient not taking: Reported on 09/25/2021) 30 tablet 0   acetaminophen (TYLENOL) 650 MG CR tablet Take 1,300 mg by mouth every 8 (eight) hours as needed for pain.     albuterol (VENTOLIN HFA) 108 (90 Base) MCG/ACT inhaler Inhale 2 puffs into the lungs every 6 (six) hours as needed for wheezing or shortness of breath. 8 g 3   amLODipine (NORVASC) 10 MG tablet Take 10 mg by mouth daily.     aspirin EC 81 MG tablet Take 81 mg by mouth daily.     atorvastatin (LIPITOR) 40 MG tablet Take 40 mg by mouth daily.     Cholecalciferol (VITAMIN D-3) 1000 units CAPS Take 1,000 Units by mouth daily.      ezetimibe (ZETIA) 10 MG tablet Take 5 mg by mouth daily.     finasteride (PROSCAR) 5 MG tablet TAKE 1 TABLET BY MOUTH  DAILY 90 tablet 3   fluticasone (FLONASE) 50 MCG/ACT nasal spray Place 1 spray into both nostrils daily as needed for allergies or rhinitis.     gabapentin (NEURONTIN) 100 MG capsule Take 100-200 mg by mouth See admin instructions. Take 200 mg by mouth in the morning and take 100 mg at bedtime     Glucerna (GLUCERNA) LIQD Take 237 mLs by mouth daily.     metFORMIN (GLUCOPHAGE-XR) 500 MG 24 hr tablet Take 1,000 mg by mouth 2 (two) times daily. Morning and evening     mupirocin ointment (BACTROBAN) 2 % Apply 1 application topically daily. With dressing changes. (Patient not taking: Reported on 09/25/2021) 22 g 0   nicotine (NICODERM CQ - DOSED IN MG/24 HOURS) 21 mg/24hr patch Place 1 patch (21 mg total) onto the skin daily. 28 patch 0   olmesartan (BENICAR) 40 MG tablet Take 40 mg by mouth daily.     potassium citrate (UROCIT-K) 10 MEQ (1080 MG) SR tablet Take 10 mEq by  mouth 3 (three) times daily with meals.     Tiotropium Bromide-Olodaterol (STIOLTO RESPIMAT) 2.5-2.5 MCG/ACT AERS Inhale 2 puffs into the lungs daily. 12 g 3   traMADol (ULTRAM) 50 MG tablet Take 1 tablet (50 mg total) by mouth every 6 (six) hours as needed (mild pain). 20 tablet 0   No current facility-administered medications for this visit.    Allergies:   Gabapentin, Morphine and related, Tape, Allevyn  adhesive [wound dressings], Amoxicillin, and Naproxen    ROS:  Please see the history of present illness.   Otherwise, review of systems are positive for ***.   All other systems are reviewed and negative.    PHYSICAL EXAM: VS:  There were no vitals taken for this visit. , BMI There is no height or weight on file to calculate BMI. GENERAL:  Well appearing NECK:  No jugular venous distention, waveform within normal limits, carotid upstroke brisk and symmetric, no bruits, no thyromegaly LUNGS:  Clear to auscultation bilaterally CHEST:  Unremarkable HEART:  PMI not displaced or sustained,S1 and S2 within normal limits, no S3, no S4, no clicks, no rubs, *** murmurs ABD:  Flat, positive bowel sounds normal in frequency in pitch, no bruits, no rebound, no guarding, no midline pulsatile mass, no hepatomegaly, no splenomegaly EXT:  2 plus pulses throughout, no edema, no cyanosis no clubbing    ***GENERAL:  Well appearing NECK:  No jugular venous distention, waveform within normal limits, carotid upstroke brisk and symmetric, no bruits, no thyromegaly LUNGS: Decreased breath sounds bilaterally with few expiratory rhonchi CHEST:  Unremarkable HEART:  PMI not displaced or sustained,S1 and S2 within normal limits, no S3, no S4, no clicks, no rubs, no murmurs ABD:  Flat, positive bowel sounds normal in frequency in pitch, no bruits, no rebound, no guarding, no midline pulsatile mass, no hepatomegaly, no splenomegaly EXT:  2 plus pulses throughout, no edema, no cyanosis no clubbing   EKG:  EKG  is  *** ordered today. The ekg ordered today demonstrates sinus rhythm, rate ***, axis within normal limits, intervals within normal limits, RSR prime V1 V2, low voltage chest leads.  Ventricular ectopy in a bigeminal pattern   Recent Labs: 02/05/2021: Magnesium 1.6 08/19/2021: ALT 6 10/02/2021: BUN 22; Creatinine, Ser 0.85; Hemoglobin 13.2; Platelets 272; Potassium 4.1; Sodium 141    Lipid Panel    Component Value Date/Time   TRIG 137 01/02/2020 0459      Wt Readings from Last 3 Encounters:  10/01/21 145 lb 1 oz (65.8 kg)  09/26/21 145 lb (65.8 kg)  08/26/21 153 lb 12.8 oz (69.8 kg)      Other studies Reviewed: Additional studies/ records that were reviewed today include: Hospital records Review of the above records demonstrates:  Please see elsewhere in the note.     ASSESSMENT AND PLAN:  PVD: *** He has had no symptoms related to this.  He actually has good pulses.  No change in therapy.   HTN: Blood pressure is ***controlled.  No change in therapy.   PVCS:   ***  He is not noticing these.  No change in therapy.  DYSLIPIDEMIA: LDL recently was *** 52 with an HDL of 43.  He will continue the meds as listed.   TOBACCO ABUSE:   *** He was unable to quit smoking.   DM: A1c is *** 6.1.  No change in therapy.    Current medicines are reviewed at length with the patient today.  The patient does not have concerns regarding medicines.  The following changes have been made:  ***  Labs/ tests ordered today include: ***  No orders of the defined types were placed in this encounter.    Disposition:   FU with me in ***      Signed, Chad Breeding, MD  11/19/2021 8:35 PM    [place]

## 2021-11-20 ENCOUNTER — Ambulatory Visit: Payer: Medicare Other | Admitting: Cardiology

## 2021-11-20 DIAGNOSIS — I10 Essential (primary) hypertension: Secondary | ICD-10-CM

## 2021-11-20 DIAGNOSIS — E118 Type 2 diabetes mellitus with unspecified complications: Secondary | ICD-10-CM

## 2021-11-20 DIAGNOSIS — R2689 Other abnormalities of gait and mobility: Secondary | ICD-10-CM | POA: Diagnosis not present

## 2021-11-20 DIAGNOSIS — Z1159 Encounter for screening for other viral diseases: Secondary | ICD-10-CM | POA: Diagnosis not present

## 2021-11-20 DIAGNOSIS — E785 Hyperlipidemia, unspecified: Secondary | ICD-10-CM

## 2021-11-20 DIAGNOSIS — I493 Ventricular premature depolarization: Secondary | ICD-10-CM

## 2021-11-20 DIAGNOSIS — M6281 Muscle weakness (generalized): Secondary | ICD-10-CM | POA: Diagnosis not present

## 2021-11-20 DIAGNOSIS — K432 Incisional hernia without obstruction or gangrene: Secondary | ICD-10-CM | POA: Diagnosis not present

## 2021-11-20 DIAGNOSIS — I739 Peripheral vascular disease, unspecified: Secondary | ICD-10-CM

## 2021-11-21 DIAGNOSIS — E860 Dehydration: Secondary | ICD-10-CM | POA: Diagnosis not present

## 2021-11-21 DIAGNOSIS — E875 Hyperkalemia: Secondary | ICD-10-CM | POA: Diagnosis not present

## 2021-11-21 DIAGNOSIS — N179 Acute kidney failure, unspecified: Secondary | ICD-10-CM | POA: Diagnosis not present

## 2021-11-21 DIAGNOSIS — I10 Essential (primary) hypertension: Secondary | ICD-10-CM | POA: Diagnosis not present

## 2021-11-21 DIAGNOSIS — R031 Nonspecific low blood-pressure reading: Secondary | ICD-10-CM | POA: Diagnosis not present

## 2021-11-25 ENCOUNTER — Encounter: Payer: Self-pay | Admitting: Cardiology

## 2021-11-25 DIAGNOSIS — R339 Retention of urine, unspecified: Secondary | ICD-10-CM | POA: Diagnosis not present

## 2021-11-25 DIAGNOSIS — T83011A Breakdown (mechanical) of indwelling urethral catheter, initial encounter: Secondary | ICD-10-CM | POA: Diagnosis not present

## 2021-11-25 DIAGNOSIS — N139 Obstructive and reflux uropathy, unspecified: Secondary | ICD-10-CM | POA: Diagnosis not present

## 2021-11-25 DIAGNOSIS — N390 Urinary tract infection, site not specified: Secondary | ICD-10-CM | POA: Diagnosis not present

## 2021-11-26 ENCOUNTER — Non-Acute Institutional Stay: Payer: Medicare Other | Admitting: Hospice

## 2021-11-26 ENCOUNTER — Non-Acute Institutional Stay: Payer: Medicare Other | Admitting: *Deleted

## 2021-11-26 VITALS — BP 92/70 | HR 100 | Temp 98.2°F | Resp 19

## 2021-11-26 DIAGNOSIS — Z515 Encounter for palliative care: Secondary | ICD-10-CM

## 2021-11-26 DIAGNOSIS — J449 Chronic obstructive pulmonary disease, unspecified: Secondary | ICD-10-CM | POA: Diagnosis not present

## 2021-11-26 DIAGNOSIS — R531 Weakness: Secondary | ICD-10-CM | POA: Diagnosis not present

## 2021-11-26 DIAGNOSIS — E119 Type 2 diabetes mellitus without complications: Secondary | ICD-10-CM

## 2021-11-26 NOTE — Progress Notes (Signed)
[facility] Palliative Care Consult Note Telephone: [phone]  Fax: [phone]  PATIENT NAME: Chad Gomez [address] [place] [address] [phone] (home)  DOB: 30-Oct-1945 MRN: [id number]  PRIMARY CARE PROVIDER:    [name], MD,  [place] [address] [phone]  REFERRING PROVIDER:   [name], [place],  [place] [address] [phone]  RESPONSIBLE PARTY:   self Contact Information     Name Relation Home Work Mobile   Hope Spouse [phone]  [phone]       TELEHEALTH VISIT STATEMENT Due to the COVID-19 crisis, this visit was done via telemedicine from my office and it was initiated and consent by this patient and or family.  I connected with patient OR PROXY by a telephone/video  and verified that I am speaking with the correct person. I discussed the limitations of evaluation and management by telemedicine. The patient expressed understanding and agreed to proceed.  [place] RN is in person with patient seeing patient/facilitating video call.  Patient endorsed palliative service.  NP called spouse and family on home numbers and left voicemail with callback number   ASSESSMENT AND / RECOMMENDATIONS:   Advance Care Planning: Our advance care planning conversation included a discussion about:    The value and importance of advance care planning  Difference between Hospice and Palliative care Exploration of goals of care in the event of a sudden injury or illness  Identification and preparation of a healthcare agent  Review and updating or creation of an  advance directive document . Decision not to resuscitate or to de-escalate disease focused treatments due to poor prognosis.  CODE STATUS: Patient affirmed he is a full code.  Goals of Care: Goals include to maximize quality of life and symptom management I spent  16 minutes providing  this initial consultation. More than 50% of the time in this consultation was spent on counseling patient and coordinating communication. -------------------------------------------------------------------------------------------------------------------------------------- Symptom Management/Plan: Generalized weakness: Worsened with recent hospitalization.  PT/OT following for strengthening and gait training.  Continue Glucerna, Pro-Stat, and offer assistance as needed during meals to ensure adequate oral intake. COPD: Very severe, stage IV by Gold classification.  Continue oxygen supplementation, Tiotropium Bromide-Olodaterol HCl; Albuterol rescue inhaler on hand.  Avoid triggers, encourage slow deep breathin Type 2 diabetes mellitus: Current A1c 5.8.  Continue metformin.  Continue diabetic diet, No concentrated sweets.  Monitor blood sugars ordered.  Repeat A1c in 3 to 6 months.  Routine CBC BMP Pain: related to recent incisional hernia repair. Managed with Tramadol. Follow up: Palliative care will continue to follow for complex medical decision making, advance care planning, and clarification of goals. Return 6 weeks or prn. Encouraged to call provider sooner with any concerns.   Family /Caregiver/Community Supports: Patient in SNF for ongoing care  HOSPICE ELIGIBILITY/DIAGNOSIS: TBD  Chief Complaint: Initial Palliative care visit  HISTORY OF PRESENT ILLNESS:  Chad Gomez is a 76 y.o. year old male  with multiple morbidities requiring close monitoring and with high risk of complications and  mortality: Type 2 diabetes mellitus, generalized weakness, incisional hernia repair, very severe COPD, adenocarcinoma of left lung stage III.   History obtained from review of EMR, discussion with primary team, caregiver, family and/or Mr. Groninger.  Review and summarization of Epic records shows history from other than patient. Rest of 10 point ROS asked and negative.  Independent interpretation of tests  and reviewed as needed, available labs,  patient records, imaging, studies and related documents from the EMR.   PAST MEDICAL HISTORY:  Active Ambulatory Problems    Diagnosis Date Noted   Ulnar neuropathy 02/07/2014   Compulsive tobacco user syndrome 02/07/2014   Allergic rhinitis 02/07/2014   Ureteral stone with hydronephrosis 03/21/2014   Atherosclerosis of native artery of both lower extremities with intermittent claudication ([place]) 05/26/2016   S/P aortobifemoral bypass surgery 05/26/2016   Centrilobular emphysema ([place]) 05/23/2019   Stage 4 very severe COPD by GOLD classification ([place]) 05/23/2019   Tobacco abuse 09/06/2019   Hypertension 09/06/2019   Nephrolithiasis 09/06/2019   Melanoma ([place]) 09/06/2019   Dyslipidemia 10/06/2019   Type 2 diabetes mellitus with complication, without long-term current use of insulin ([place]) 10/06/2019   S/P partial lobectomy of lung 10/28/2019   Lung cancer, lingula ([place]) 11/01/2019   Goals of care, counseling/discussion 11/09/2019   Encounter for antineoplastic chemotherapy 11/30/2019   Adenocarcinoma of left lung, stage 3 ([place]) 12/16/2019   Encounter for antineoplastic immunotherapy 12/19/2019   Leukopenia due to antineoplastic chemotherapy ([place]) 12/26/2019   Sepsis ([place]) 12/26/2019   Ventral hernia 12/26/2019   Chronic indwelling Foley catheter 12/26/2019   Protein-calorie malnutrition, severe 12/28/2019   Lung nodule 05/08/2021   Malignant pleural effusion 05/30/2021   Cellulitis of chest wall 08/26/2021   PVD (peripheral vascular disease) ([place]) 08/29/2021   Incisional hernia 10/01/2021   History of incisional hernia repair 10/03/2021   PVC's (premature ventricular contractions) 11/19/2021   Resolved Ambulatory Problems    Diagnosis Date Noted   Right ureteral stone 10/13/2013   Bladder stone 10/13/2013   BP (high blood pressure) 02/07/2014   Calculus of kidney 02/07/2014   Malignant melanoma ([place]) 02/07/2014   Ureteral stone  03/21/2014   Acute renal insufficiency 03/22/2014   Preop cardiovascular exam 11/24/2014   Aortoiliac occlusive disease ([place]) 12/28/2014   Post-op pain 01/15/2015   Lung nodule < 6cm on CT 05/23/2019   Pneumothorax, postoperative 09/06/2019   Educated about COVID-19 virus infection 10/06/2019   Incarcerated hernia 12/26/2019   SBO (small bowel obstruction) ([place]) 12/26/2019   Dehydration 12/26/2019   Lactic acidosis 12/26/2019   Acute lower UTI 12/26/2019   Hypomagnesemia 01/15/2020   Hypotension 05/06/2020   Past Medical History:  Diagnosis Date   BPH (benign prostatic hypertrophy)    Chronic kidney disease    Complication of anesthesia    COPD (chronic obstructive pulmonary disease) ([place])    Diabetes mellitus without complication ([place])    History of bladder stone    History of colon polyps    History of kidney stones    History of melanoma excision    Hyperlipidemia    Iron deficiency    Open wound    Peripheral vascular disease ([place])    Shingles 05-15-2015    SOCIAL HX:  Social History   Tobacco Use   Smoking status: Every Day    Packs/day: 2.00    Years: 60.00    Pack years: 120.00    Types: Cigarettes    Start date: 06/24/1959   Smokeless tobacco: Never   Tobacco comments:    a little over a pack per day  Substance Use Topics   Alcohol use: No    Alcohol/week: 0.0 standard drinks     FAMILY HX:  Family History  Problem Relation Age of Onset   Dementia Mother    Hypertension Mother    Stroke Mother 71   Dementia Father    Hypertension Sister    Colon  cancer Neg Hx       ALLERGIES:  Allergies  Allergen Reactions   Gabapentin Other (See Comments)    "Slurred speech in high doses"   Morphine And Related Anxiety and Other (See Comments)    Combative, incoherent     Tape Other (See Comments) and Rash    Surgical tape Adhesive tape causes blisters   Allevyn Adhesive [Wound Dressings] Other (See Comments)    blisters   Amoxicillin Other (See  Comments)    Severe stomach pain   Naproxen Swelling    Eyes swell      PERTINENT MEDICATIONS:  Outpatient Encounter Medications as of 11/26/2021  Medication Sig   acetaminophen (TYLENOL) 500 MG tablet Take 2 tablets (1,000 mg total) by mouth every 6 (six) hours as needed for mild pain. (Patient not taking: Reported on 09/25/2021)   acetaminophen (TYLENOL) 650 MG CR tablet Take 1,300 mg by mouth every 8 (eight) hours as needed for pain.   albuterol (VENTOLIN HFA) 108 (90 Base) MCG/ACT inhaler Inhale 2 puffs into the lungs every 6 (six) hours as needed for wheezing or shortness of breath.   amLODipine (NORVASC) 10 MG tablet Take 10 mg by mouth daily.   aspirin EC 81 MG tablet Take 81 mg by mouth daily.   atorvastatin (LIPITOR) 40 MG tablet Take 40 mg by mouth daily.   Cholecalciferol (VITAMIN D-3) 1000 units CAPS Take 1,000 Units by mouth daily.    ezetimibe (ZETIA) 10 MG tablet Take 5 mg by mouth daily.   finasteride (PROSCAR) 5 MG tablet TAKE 1 TABLET BY MOUTH  DAILY   fluticasone (FLONASE) 50 MCG/ACT nasal spray Place 1 spray into both nostrils daily as needed for allergies or rhinitis.   gabapentin (NEURONTIN) 100 MG capsule Take 100-200 mg by mouth See admin instructions. Take 200 mg by mouth in the morning and take 100 mg at bedtime   Glucerna (GLUCERNA) LIQD Take 237 mLs by mouth daily.   metFORMIN (GLUCOPHAGE-XR) 500 MG 24 hr tablet Take 1,000 mg by mouth 2 (two) times daily. Morning and evening   mupirocin ointment (BACTROBAN) 2 % Apply 1 application topically daily. With dressing changes. (Patient not taking: Reported on 09/25/2021)   nicotine (NICODERM CQ - DOSED IN MG/24 HOURS) 21 mg/24hr patch Place 1 patch (21 mg total) onto the skin daily.   olmesartan (BENICAR) 40 MG tablet Take 40 mg by mouth daily.   potassium citrate (UROCIT-K) 10 MEQ (1080 MG) SR tablet Take 10 mEq by mouth 3 (three) times daily with meals.   Tiotropium Bromide-Olodaterol (STIOLTO RESPIMAT) 2.5-2.5 MCG/ACT AERS  Inhale 2 puffs into the lungs daily.   traMADol (ULTRAM) 50 MG tablet Take 1 tablet (50 mg total) by mouth every 6 (six) hours as needed (mild pain).   No facility-administered encounter medications on file as of 11/26/2021.     Thank you for the opportunity to participate in the care of Mr. Polgar.  The palliative care team will continue to follow. Please call our office at [phone] if we can be of additional assistance.   Note: Portions of this note were generated with [employer]. Dictation errors may occur despite best attempts at proofreading.  Teodoro Spray, NP   Assistance as needed

## 2021-11-26 NOTE — Progress Notes (Signed)
[facility] RN NOTE  PATIENT NAME: Chad Gomez DOB: Dec 16, 1945 MRN: [id number]  PRIMARY CARE PROVIDER: [name], MD  RESPONSIBLE PARTY: [name] (wife) Acct ID - Guarantor Home Phone Work Phone Relationship Acct Type  [id number] MARISSA, [phone]  Self P/F     [address], [place], [place] [address]   RN face to face visit completed with patient in his room at the facility [facility]). Conferenced in [facility] NP via video. Please see NP note for complete visit details.   (Duration of visit and documentation 30 minutes)   Daryl Eastern, RN BSN

## 2021-11-28 DIAGNOSIS — R627 Adult failure to thrive: Secondary | ICD-10-CM | POA: Diagnosis not present

## 2021-12-02 DIAGNOSIS — R0989 Other specified symptoms and signs involving the circulatory and respiratory systems: Secondary | ICD-10-CM | POA: Diagnosis not present

## 2021-12-02 DIAGNOSIS — R051 Acute cough: Secondary | ICD-10-CM | POA: Diagnosis not present

## 2021-12-02 DIAGNOSIS — R627 Adult failure to thrive: Secondary | ICD-10-CM | POA: Diagnosis not present

## 2021-12-02 DIAGNOSIS — J449 Chronic obstructive pulmonary disease, unspecified: Secondary | ICD-10-CM | POA: Diagnosis not present

## 2021-12-03 ENCOUNTER — Emergency Department (HOSPITAL_COMMUNITY): Payer: Medicare Other

## 2021-12-03 ENCOUNTER — Encounter (HOSPITAL_COMMUNITY): Payer: Self-pay | Admitting: Critical Care Medicine

## 2021-12-03 ENCOUNTER — Inpatient Hospital Stay (HOSPITAL_COMMUNITY): Payer: Medicare Other

## 2021-12-03 ENCOUNTER — Inpatient Hospital Stay (HOSPITAL_COMMUNITY)
Admission: EM | Admit: 2021-12-03 | Discharge: 2021-12-10 | DRG: 871 | Disposition: A | Discharge status: Skilled Nursing Facility | Payer: Medicare Other | Attending: Internal Medicine | Admitting: Internal Medicine

## 2021-12-03 DIAGNOSIS — J309 Allergic rhinitis, unspecified: Secondary | ICD-10-CM | POA: Diagnosis not present

## 2021-12-03 DIAGNOSIS — J441 Chronic obstructive pulmonary disease with (acute) exacerbation: Secondary | ICD-10-CM | POA: Diagnosis present

## 2021-12-03 DIAGNOSIS — F17210 Nicotine dependence, cigarettes, uncomplicated: Secondary | ICD-10-CM | POA: Diagnosis present

## 2021-12-03 DIAGNOSIS — R627 Adult failure to thrive: Secondary | ICD-10-CM | POA: Diagnosis not present

## 2021-12-03 DIAGNOSIS — J439 Emphysema, unspecified: Secondary | ICD-10-CM | POA: Diagnosis not present

## 2021-12-03 DIAGNOSIS — E1165 Type 2 diabetes mellitus with hyperglycemia: Secondary | ICD-10-CM | POA: Diagnosis not present

## 2021-12-03 DIAGNOSIS — Z20822 Contact with and (suspected) exposure to covid-19: Secondary | ICD-10-CM | POA: Diagnosis present

## 2021-12-03 DIAGNOSIS — Z888 Allergy status to other drugs, medicaments and biological substances status: Secondary | ICD-10-CM

## 2021-12-03 DIAGNOSIS — Z885 Allergy status to narcotic agent status: Secondary | ICD-10-CM

## 2021-12-03 DIAGNOSIS — R0602 Shortness of breath: Secondary | ICD-10-CM | POA: Diagnosis not present

## 2021-12-03 DIAGNOSIS — N179 Acute kidney failure, unspecified: Secondary | ICD-10-CM | POA: Diagnosis present

## 2021-12-03 DIAGNOSIS — I499 Cardiac arrhythmia, unspecified: Secondary | ICD-10-CM | POA: Diagnosis not present

## 2021-12-03 DIAGNOSIS — Z681 Body mass index (BMI) 19 or less, adult: Secondary | ICD-10-CM

## 2021-12-03 DIAGNOSIS — R6521 Severe sepsis with septic shock: Secondary | ICD-10-CM | POA: Diagnosis present

## 2021-12-03 DIAGNOSIS — Z85828 Personal history of other malignant neoplasm of skin: Secondary | ICD-10-CM

## 2021-12-03 DIAGNOSIS — C782 Secondary malignant neoplasm of pleura: Secondary | ICD-10-CM | POA: Diagnosis present

## 2021-12-03 DIAGNOSIS — E785 Hyperlipidemia, unspecified: Secondary | ICD-10-CM | POA: Diagnosis not present

## 2021-12-03 DIAGNOSIS — L89156 Pressure-induced deep tissue damage of sacral region: Secondary | ICD-10-CM | POA: Diagnosis present

## 2021-12-03 DIAGNOSIS — J189 Pneumonia, unspecified organism: Secondary | ICD-10-CM | POA: Diagnosis not present

## 2021-12-03 DIAGNOSIS — E11649 Type 2 diabetes mellitus with hypoglycemia without coma: Secondary | ICD-10-CM | POA: Diagnosis not present

## 2021-12-03 DIAGNOSIS — J690 Pneumonitis due to inhalation of food and vomit: Secondary | ICD-10-CM | POA: Diagnosis not present

## 2021-12-03 DIAGNOSIS — R54 Age-related physical debility: Secondary | ICD-10-CM | POA: Diagnosis not present

## 2021-12-03 DIAGNOSIS — A419 Sepsis, unspecified organism: Secondary | ICD-10-CM | POA: Diagnosis not present

## 2021-12-03 DIAGNOSIS — R0902 Hypoxemia: Secondary | ICD-10-CM | POA: Diagnosis not present

## 2021-12-03 DIAGNOSIS — R338 Other retention of urine: Secondary | ICD-10-CM | POA: Diagnosis present

## 2021-12-03 DIAGNOSIS — R0600 Dyspnea, unspecified: Secondary | ICD-10-CM | POA: Diagnosis not present

## 2021-12-03 DIAGNOSIS — R1310 Dysphagia, unspecified: Secondary | ICD-10-CM | POA: Diagnosis present

## 2021-12-03 DIAGNOSIS — E1151 Type 2 diabetes mellitus with diabetic peripheral angiopathy without gangrene: Secondary | ICD-10-CM | POA: Diagnosis not present

## 2021-12-03 DIAGNOSIS — N401 Enlarged prostate with lower urinary tract symptoms: Secondary | ICD-10-CM | POA: Diagnosis present

## 2021-12-03 DIAGNOSIS — Z902 Acquired absence of lung [part of]: Secondary | ICD-10-CM

## 2021-12-03 DIAGNOSIS — J9601 Acute respiratory failure with hypoxia: Secondary | ICD-10-CM | POA: Diagnosis present

## 2021-12-03 DIAGNOSIS — J9602 Acute respiratory failure with hypercapnia: Secondary | ICD-10-CM | POA: Diagnosis not present

## 2021-12-03 DIAGNOSIS — J90 Pleural effusion, not elsewhere classified: Secondary | ICD-10-CM | POA: Diagnosis not present

## 2021-12-03 DIAGNOSIS — Z91048 Other nonmedicinal substance allergy status: Secondary | ICD-10-CM

## 2021-12-03 DIAGNOSIS — J910 Malignant pleural effusion: Secondary | ICD-10-CM | POA: Diagnosis present

## 2021-12-03 DIAGNOSIS — J209 Acute bronchitis, unspecified: Secondary | ICD-10-CM | POA: Diagnosis not present

## 2021-12-03 DIAGNOSIS — Z881 Allergy status to other antibiotic agents status: Secondary | ICD-10-CM

## 2021-12-03 DIAGNOSIS — J440 Chronic obstructive pulmonary disease with acute lower respiratory infection: Secondary | ICD-10-CM | POA: Diagnosis not present

## 2021-12-03 DIAGNOSIS — I10 Essential (primary) hypertension: Secondary | ICD-10-CM | POA: Diagnosis not present

## 2021-12-03 DIAGNOSIS — R069 Unspecified abnormalities of breathing: Secondary | ICD-10-CM | POA: Diagnosis not present

## 2021-12-03 DIAGNOSIS — R110 Nausea: Secondary | ICD-10-CM | POA: Diagnosis not present

## 2021-12-03 DIAGNOSIS — R6889 Other general symptoms and signs: Secondary | ICD-10-CM | POA: Diagnosis not present

## 2021-12-03 DIAGNOSIS — Z7189 Other specified counseling: Secondary | ICD-10-CM | POA: Diagnosis not present

## 2021-12-03 DIAGNOSIS — G4700 Insomnia, unspecified: Secondary | ICD-10-CM | POA: Diagnosis not present

## 2021-12-03 DIAGNOSIS — R0609 Other forms of dyspnea: Secondary | ICD-10-CM

## 2021-12-03 DIAGNOSIS — R634 Abnormal weight loss: Secondary | ICD-10-CM | POA: Diagnosis not present

## 2021-12-03 DIAGNOSIS — Z743 Need for continuous supervision: Secondary | ICD-10-CM | POA: Diagnosis not present

## 2021-12-03 DIAGNOSIS — C3412 Malignant neoplasm of upper lobe, left bronchus or lung: Secondary | ICD-10-CM | POA: Diagnosis not present

## 2021-12-03 DIAGNOSIS — Z515 Encounter for palliative care: Secondary | ICD-10-CM | POA: Diagnosis not present

## 2021-12-03 DIAGNOSIS — E876 Hypokalemia: Secondary | ICD-10-CM | POA: Diagnosis present

## 2021-12-03 DIAGNOSIS — R64 Cachexia: Secondary | ICD-10-CM | POA: Diagnosis not present

## 2021-12-03 DIAGNOSIS — Z886 Allergy status to analgesic agent status: Secondary | ICD-10-CM

## 2021-12-03 DIAGNOSIS — R0989 Other specified symptoms and signs involving the circulatory and respiratory systems: Secondary | ICD-10-CM | POA: Diagnosis not present

## 2021-12-03 DIAGNOSIS — Z85820 Personal history of malignant melanoma of skin: Secondary | ICD-10-CM

## 2021-12-03 DIAGNOSIS — Z7401 Bed confinement status: Secondary | ICD-10-CM | POA: Diagnosis not present

## 2021-12-03 DIAGNOSIS — Z66 Do not resuscitate: Secondary | ICD-10-CM | POA: Diagnosis present

## 2021-12-03 DIAGNOSIS — M2550 Pain in unspecified joint: Secondary | ICD-10-CM | POA: Diagnosis not present

## 2021-12-03 DIAGNOSIS — Z79891 Long term (current) use of opiate analgesic: Secondary | ICD-10-CM

## 2021-12-03 DIAGNOSIS — C3492 Malignant neoplasm of unspecified part of left bronchus or lung: Secondary | ICD-10-CM | POA: Diagnosis not present

## 2021-12-03 DIAGNOSIS — Z7984 Long term (current) use of oral hypoglycemic drugs: Secondary | ICD-10-CM

## 2021-12-03 DIAGNOSIS — E43 Unspecified severe protein-calorie malnutrition: Secondary | ICD-10-CM | POA: Diagnosis not present

## 2021-12-03 DIAGNOSIS — Z9221 Personal history of antineoplastic chemotherapy: Secondary | ICD-10-CM

## 2021-12-03 DIAGNOSIS — R451 Restlessness and agitation: Secondary | ICD-10-CM | POA: Diagnosis not present

## 2021-12-03 DIAGNOSIS — R051 Acute cough: Secondary | ICD-10-CM | POA: Diagnosis not present

## 2021-12-03 DIAGNOSIS — R410 Disorientation, unspecified: Secondary | ICD-10-CM | POA: Diagnosis not present

## 2021-12-03 DIAGNOSIS — J449 Chronic obstructive pulmonary disease, unspecified: Secondary | ICD-10-CM | POA: Diagnosis not present

## 2021-12-03 DIAGNOSIS — Z8249 Family history of ischemic heart disease and other diseases of the circulatory system: Secondary | ICD-10-CM

## 2021-12-03 DIAGNOSIS — R000 Tachycardia, unspecified: Secondary | ICD-10-CM | POA: Diagnosis not present

## 2021-12-03 DIAGNOSIS — Z79899 Other long term (current) drug therapy: Secondary | ICD-10-CM

## 2021-12-03 LAB — COMPREHENSIVE METABOLIC PANEL
ALT: 12 U/L (ref 0–44)
AST: 25 U/L (ref 15–41)
Albumin: 2.8 g/dL — ABNORMAL LOW (ref 3.5–5.0)
Alkaline Phosphatase: 111 U/L (ref 38–126)
Anion gap: 15 (ref 5–15)
BUN: 49 mg/dL — ABNORMAL HIGH (ref 8–23)
CO2: 21 mmol/L — ABNORMAL LOW (ref 22–32)
Calcium: 10.2 mg/dL (ref 8.9–10.3)
Chloride: 104 mmol/L (ref 98–111)
Creatinine, Ser: 1.61 mg/dL — ABNORMAL HIGH (ref 0.61–1.24)
GFR, Estimated: 45 mL/min — ABNORMAL LOW (ref >60)
Glucose, Bld: 239 mg/dL — ABNORMAL HIGH (ref 70–99)
Potassium: 5.1 mmol/L (ref 3.5–5.1)
Sodium: 140 mmol/L (ref 135–145)
Total Bilirubin: 1 mg/dL (ref 0.3–1.2)
Total Protein: 6.1 g/dL — ABNORMAL LOW (ref 6.5–8.1)

## 2021-12-03 LAB — CBC WITH DIFFERENTIAL/PLATELET
Abs Immature Granulocytes: 0.08 10*3/uL — ABNORMAL HIGH (ref 0.00–0.07)
Basophils Absolute: 0.1 10*3/uL (ref 0.0–0.1)
Basophils Relative: 0 %
Eosinophils Absolute: 0.2 10*3/uL (ref 0.0–0.5)
Eosinophils Relative: 1 %
HCT: 42 % (ref 39.0–52.0)
Hemoglobin: 13.1 g/dL (ref 13.0–17.0)
Immature Granulocytes: 1 %
Lymphocytes Relative: 13 %
Lymphs Abs: 1.5 10*3/uL (ref 0.7–4.0)
MCH: 29.8 pg (ref 26.0–34.0)
MCHC: 31.2 g/dL (ref 30.0–36.0)
MCV: 95.7 fL (ref 80.0–100.0)
Monocytes Absolute: 0.8 10*3/uL (ref 0.1–1.0)
Monocytes Relative: 6 %
Neutro Abs: 9.4 10*3/uL — ABNORMAL HIGH (ref 1.7–7.7)
Neutrophils Relative %: 79 %
Platelets: 326 10*3/uL (ref 150–400)
RBC: 4.39 MIL/uL (ref 4.22–5.81)
RDW: 14.6 % (ref 11.5–15.5)
WBC: 12 10*3/uL — ABNORMAL HIGH (ref 4.0–10.5)
nRBC: 0 % (ref 0.0–0.2)

## 2021-12-03 LAB — BLOOD GAS, VENOUS
Acid-base deficit: 2.5 mmol/L — ABNORMAL HIGH (ref 0.0–2.0)
Bicarbonate: 25.3 mmol/L (ref 20.0–28.0)
O2 Saturation: 27.3 %
Patient temperature: 36.3
pCO2, Ven: 53 mmHg (ref 44–60)
pH, Ven: 7.28 (ref 7.25–7.43)
pO2, Ven: <31 mmHg — CL (ref 32–45)

## 2021-12-03 LAB — URINALYSIS, ROUTINE W REFLEX MICROSCOPIC
Bilirubin Urine: NEGATIVE
Glucose, UA: NEGATIVE mg/dL
Ketones, ur: NEGATIVE mg/dL
Nitrite: NEGATIVE
Protein, ur: NEGATIVE mg/dL
Specific Gravity, Urine: 1.041 — ABNORMAL HIGH (ref 1.005–1.030)
WBC, UA: >50 WBC/hpf — ABNORMAL HIGH (ref 0–5)
pH: 5 (ref 5.0–8.0)

## 2021-12-03 LAB — I-STAT VENOUS BLOOD GAS, ED
Acid-base deficit: 4 mmol/L — ABNORMAL HIGH (ref 0.0–2.0)
Bicarbonate: 25.1 mmol/L (ref 20.0–28.0)
Calcium, Ion: 1.27 mmol/L (ref 1.15–1.40)
HCT: 39 % (ref 39.0–52.0)
Hemoglobin: 13.3 g/dL (ref 13.0–17.0)
O2 Saturation: 99 %
Potassium: 4.9 mmol/L (ref 3.5–5.1)
Sodium: 137 mmol/L (ref 135–145)
TCO2: 27 mmol/L (ref 22–32)
pCO2, Ven: 61 mmHg — ABNORMAL HIGH (ref 44–60)
pH, Ven: 7.222 — ABNORMAL LOW (ref 7.25–7.43)
pO2, Ven: 183 mmHg — ABNORMAL HIGH (ref 32–45)

## 2021-12-03 LAB — LACTIC ACID, PLASMA
Lactic Acid, Venous: 5.5 mmol/L (ref 0.5–1.9)
Lactic Acid, Venous: 3.8 mmol/L (ref 0.5–1.9)

## 2021-12-03 LAB — MRSA NEXT GEN BY PCR, NASAL: MRSA by PCR Next Gen: NOT DETECTED

## 2021-12-03 LAB — GLUCOSE, CAPILLARY
Glucose-Capillary: 176 mg/dL — ABNORMAL HIGH (ref 70–99)
Glucose-Capillary: 194 mg/dL — ABNORMAL HIGH (ref 70–99)
Glucose-Capillary: 104 mg/dL — ABNORMAL HIGH (ref 70–99)

## 2021-12-03 LAB — BRAIN NATRIURETIC PEPTIDE: B Natriuretic Peptide: 57.9 pg/mL (ref 0.0–100.0)

## 2021-12-03 LAB — TROPONIN I (HIGH SENSITIVITY)
Troponin I (High Sensitivity): 41 ng/L — ABNORMAL HIGH (ref <18)
Troponin I (High Sensitivity): 45 ng/L — ABNORMAL HIGH (ref <18)

## 2021-12-03 LAB — SARS CORONAVIRUS 2 BY RT PCR: SARS Coronavirus 2 by RT PCR: NEGATIVE

## 2021-12-03 LAB — PROTIME-INR
INR: 1.2 (ref 0.8–1.2)
Prothrombin Time: 15.2 seconds (ref 11.4–15.2)

## 2021-12-03 LAB — APTT: aPTT: 28 seconds (ref 24–36)

## 2021-12-03 LAB — ECHOCARDIOGRAM COMPLETE: S' Lateral: 2 cm

## 2021-12-03 MED ORDER — PHENYLEPHRINE 80 MCG/ML (10ML) SYRINGE FOR IV PUSH (FOR BLOOD PRESSURE SUPPORT)
PREFILLED_SYRINGE | INTRAVENOUS | Status: AC
  Administered 2021-12-03: 120 ug via INTRAVENOUS
  Filled 2021-12-03: qty 10

## 2021-12-03 MED ORDER — IPRATROPIUM-ALBUTEROL 0.5-2.5 (3) MG/3ML IN SOLN
3.0000 mL | RESPIRATORY_TRACT | Status: DC | PRN
  Administered 2021-12-04 – 2021-12-05 (×2): 3 mL via RESPIRATORY_TRACT
  Filled 2021-12-03: qty 3

## 2021-12-03 MED ORDER — CHLORHEXIDINE GLUCONATE CLOTH 2 % EX PADS
6.0000 | MEDICATED_PAD | Freq: Every day | CUTANEOUS | Status: DC
  Administered 2021-12-03 – 2021-12-10 (×8): 6 via TOPICAL

## 2021-12-03 MED ORDER — LACTATED RINGERS IV BOLUS (SEPSIS)
250.0000 mL | Freq: Once | INTRAVENOUS | Status: AC
  Administered 2021-12-03: 250 mL via INTRAVENOUS

## 2021-12-03 MED ORDER — DOCUSATE SODIUM 100 MG PO CAPS: 100.0000 mg | ORAL_CAPSULE | Freq: Two times a day (BID) | ORAL | Status: DC | PRN

## 2021-12-03 MED ORDER — LACTATED RINGERS IV SOLN: INTRAVENOUS | Status: AC

## 2021-12-03 MED ORDER — SODIUM CHLORIDE 0.9 % IV SOLN: 500.0000 mg | INTRAVENOUS | Status: DC

## 2021-12-03 MED ORDER — LACTATED RINGERS IV BOLUS (SEPSIS)
1000.0000 mL | Freq: Once | INTRAVENOUS | Status: AC
  Administered 2021-12-03: 1000 mL via INTRAVENOUS

## 2021-12-03 MED ORDER — LACTATED RINGERS IV BOLUS (SEPSIS): 1000.0000 mL | Freq: Once | INTRAVENOUS | Status: DC

## 2021-12-03 MED ORDER — PHENYLEPHRINE 80 MCG/ML (10ML) SYRINGE FOR IV PUSH (FOR BLOOD PRESSURE SUPPORT): 80.0000 ug | PREFILLED_SYRINGE | Freq: Once | INTRAVENOUS | Status: AC | PRN

## 2021-12-03 MED ORDER — REVEFENACIN 175 MCG/3ML IN SOLN
175.0000 ug | Freq: Every day | RESPIRATORY_TRACT | Status: DC
Start: 2021-12-03 — End: 2021-12-10
  Administered 2021-12-04 – 2021-12-10 (×7): 175 ug via RESPIRATORY_TRACT
  Filled 2021-12-03 (×9): qty 3

## 2021-12-03 MED ORDER — VANCOMYCIN VARIABLE DOSE PER UNSTABLE RENAL FUNCTION (PHARMACIST DOSING): Status: DC

## 2021-12-03 MED ORDER — HEPARIN SODIUM (PORCINE) 5000 UNIT/ML IJ SOLN
5000.0000 [IU] | Freq: Three times a day (TID) | INTRAMUSCULAR | Status: DC
  Administered 2021-12-03 – 2021-12-10 (×21): 5000 [IU] via SUBCUTANEOUS
  Filled 2021-12-03 (×22): qty 1

## 2021-12-03 MED ORDER — LACTATED RINGERS IV BOLUS
1000.0000 mL | Freq: Once | INTRAVENOUS | Status: AC
  Administered 2021-12-03: 1000 mL via INTRAVENOUS

## 2021-12-03 MED ORDER — POLYETHYLENE GLYCOL 3350 17 G PO PACK: 17.0000 g | PACK | Freq: Every day | ORAL | Status: DC | PRN

## 2021-12-03 MED ORDER — IPRATROPIUM-ALBUTEROL 0.5-2.5 (3) MG/3ML IN SOLN: 3.0000 mL | Freq: Four times a day (QID) | RESPIRATORY_TRACT | Status: DC | PRN

## 2021-12-03 MED ORDER — SODIUM CHLORIDE 0.9 % IV SOLN
500.0000 mg | INTRAVENOUS | Status: AC
  Administered 2021-12-03 – 2021-12-07 (×5): 500 mg via INTRAVENOUS
  Filled 2021-12-03 (×7): qty 5

## 2021-12-03 MED ORDER — VASOPRESSIN 20 UNITS/100 ML INFUSION FOR SHOCK: 0.0000 [IU]/min | INTRAVENOUS | Status: DC

## 2021-12-03 MED ORDER — NOREPINEPHRINE 4 MG/250ML-% IV SOLN: 0.0000 ug/min | INTRAVENOUS | Status: DC

## 2021-12-03 MED ORDER — NICOTINE 21 MG/24HR TD PT24
21.0000 mg | MEDICATED_PATCH | Freq: Every day | TRANSDERMAL | Status: DC
  Administered 2021-12-03 – 2021-12-10 (×8): 21 mg via TRANSDERMAL
  Filled 2021-12-03 (×8): qty 1

## 2021-12-03 MED ORDER — VANCOMYCIN HCL 1500 MG/300ML IV SOLN
1500.0000 mg | Freq: Once | INTRAVENOUS | Status: AC
  Administered 2021-12-03: 1500 mg via INTRAVENOUS
  Filled 2021-12-03: qty 300

## 2021-12-03 MED ORDER — LIDOCAINE HCL URETHRAL/MUCOSAL 2 % EX GEL
1.0000 "application " | Freq: Once | CUTANEOUS | Status: AC
  Administered 2021-12-03: 1 via URETHRAL
  Filled 2021-12-03 (×2): qty 6

## 2021-12-03 MED ORDER — CHLORHEXIDINE GLUCONATE 0.12 % MT SOLN
15.0000 mL | Freq: Two times a day (BID) | OROMUCOSAL | Status: DC
  Administered 2021-12-03 – 2021-12-10 (×14): 15 mL via OROMUCOSAL
  Filled 2021-12-03 (×13): qty 15

## 2021-12-03 MED ORDER — IOHEXOL 350 MG/ML SOLN
80.0000 mL | Freq: Once | INTRAVENOUS | Status: AC | PRN
  Administered 2021-12-03: 80 mL via INTRAVENOUS

## 2021-12-03 MED ORDER — SODIUM CHLORIDE 0.9 % IV SOLN
2.0000 g | Freq: Two times a day (BID) | INTRAVENOUS | Status: AC
  Administered 2021-12-03 – 2021-12-07 (×10): 2 g via INTRAVENOUS
  Filled 2021-12-03 (×10): qty 12.5

## 2021-12-03 MED ORDER — NOREPINEPHRINE 4 MG/250ML-% IV SOLN
2.0000 ug/min | INTRAVENOUS | Status: DC
  Administered 2021-12-03: 12 ug/min via INTRAVENOUS

## 2021-12-03 MED ORDER — ARFORMOTEROL TARTRATE 15 MCG/2ML IN NEBU
15.0000 ug | INHALATION_SOLUTION | Freq: Two times a day (BID) | RESPIRATORY_TRACT | Status: DC
  Administered 2021-12-03 – 2021-12-10 (×14): 15 ug via RESPIRATORY_TRACT
  Filled 2021-12-03 (×15): qty 2

## 2021-12-03 MED ORDER — INSULIN ASPART 100 UNIT/ML IJ SOLN
0.0000 [IU] | INTRAMUSCULAR | Status: DC
  Administered 2021-12-03: 2 [IU] via SUBCUTANEOUS
  Administered 2021-12-04: 1 [IU] via SUBCUTANEOUS

## 2021-12-03 MED ORDER — NOREPINEPHRINE 4 MG/250ML-% IV SOLN
INTRAVENOUS | Status: AC
  Administered 2021-12-03: 12 ug/min via INTRAVENOUS
  Filled 2021-12-03: qty 250

## 2021-12-03 MED ORDER — SODIUM CHLORIDE 0.9 % IV SOLN: 250.0000 mL | INTRAVENOUS | Status: DC

## 2021-12-03 MED ORDER — METHYLPREDNISOLONE SODIUM SUCC 40 MG IJ SOLR
40.0000 mg | Freq: Every day | INTRAMUSCULAR | Status: DC
  Administered 2021-12-03 – 2021-12-10 (×8): 40 mg via INTRAVENOUS
  Filled 2021-12-03 (×9): qty 1

## 2021-12-03 MED ORDER — VASOPRESSIN 20 UNITS/100 ML INFUSION FOR SHOCK
INTRAVENOUS | Status: AC
  Administered 2021-12-03: 0.03 [IU]/min via INTRAVENOUS
  Administered 2021-12-03: 20 [IU]
  Filled 2021-12-03: qty 100

## 2021-12-03 MED ORDER — ORAL CARE MOUTH RINSE
15.0000 mL | Freq: Two times a day (BID) | OROMUCOSAL | Status: DC
  Administered 2021-12-05 – 2021-12-10 (×11): 15 mL via OROMUCOSAL

## 2021-12-03 NOTE — Progress Notes (Signed)
RT assisted with transport of this pt from ED02 to 36M03 while on BiPAP. Pt tolerated well with no complications. Report given to 36M RT.

## 2021-12-03 NOTE — Progress Notes (Signed)
PHARMACY ANTIBIOTIC CONSULT NOTE   Chad Gomez a 76 y.o. male admitted on 12/03/2021 with severe COPD, stage IV NSCLC presented with SOB, hypoxia, tachycardia, and HoTN .  Pharmacy has been consulted for vancomycin/cefepime dosing for sepsis.    12/03/2021: Scr 1.61 (0.8-1), LA 5.5, WBC 12  Vital Signs: afebrile, HR elevated, BP 77/65- soft   CrCl ~ 34.73 mL/min   Plan: START Cefepime 2g IV Q12H GIVE Vancomycin 1,500 mg IV x1 (Wt used: 61 kg- via flowsheets)  THEN Vancomycin dosing per unstable renal function  START Azithromycin 500 mg IV daily per MD  Monitor renal function, clinical status, de-escalation, C/S, levels as indicated  F/U strep pneumo urinary antigen, legionella   Allergies:  Allergies  Allergen Reactions   Morphine And Related Anxiety and Other (See Comments)    Combative Incoherent       Neurontin [Gabapentin] Other (See Comments)    "Slurred speech in high doses"   Tape Other (See Comments) and Rash    Surgical tape Adhesive tape causes blisters   Allevyn Adhesive [Wound Dressings] Other (See Comments)    blisters   Amoxil [Amoxicillin] Other (See Comments)    Severe stomach pain   Naprosyn [Naproxen] Swelling    Eyes swell    There were no vitals filed for this visit.     Latest Ref Rng & Units 12/03/2021    1:16 PM 10/02/2021    3:37 AM 10/01/2021   12:50 PM  CBC  WBC 4.0 - 10.5 K/uL 12.0  14.2  12.5   Hemoglobin 13.0 - 17.0 g/dL 13.1  13.2  14.2   Hematocrit 39.0 - 52.0 % 42.0  41.8  44.9   Platelets 150 - 400 K/uL 326  272  275      Antimicrobials this admission: Vancomycin 12/03/2021>>  Cefepime 12/03/2021>>  Microbiology results: 12/03/2021 Bcx: sent 12/03/2021 Ucx: sent  12/03/2021 MRSA PCR: sent   Thank you for allowing pharmacy to be a part of this patient's care.  Adria Dill, PharmD PGY-1 Acute Care Resident  12/03/2021 2:07 PM

## 2021-12-03 NOTE — ED Provider Notes (Signed)
[facility] Provider Note  CSN: [id number] Arrival date & time: 12/03/21 1254  Chief Complaint(s) Shortness of Breath  HPI Chad Gomez is a 76 y.o. male with PMH COPD on 4 L home O2, lung cancer, stage IV lung cancer with cachexia who presents emergency department for evaluation of respiratory distress.  Arrives from the EMS with complaints of shortness of breath and found to be 70% on room air in the field.  Patient arrives on a nonrebreather saturating 100%.  Patient arrives ill-appearing, tachypneic with accessory muscle use, hypotensive and significantly tachycardic.  He is unable to provide further history secondary to severe respiratory distress.   Past Medical History Past Medical History:  Diagnosis Date   BPH (benign prostatic hypertrophy)    weak stream   Chronic kidney disease    Complication of anesthesia    urinary retention   COPD (chronic obstructive pulmonary disease) (HCC)    Diabetes mellitus without complication (HCC)    Type II   History of bladder stone    History of colon polyps    08/2013   pre-canerous   History of kidney stones    History of melanoma excision    LEFT EAR   Hyperlipidemia    Hypertension    Iron deficiency    Lung cancer, lingula ([place]) 11/01/2019   Open wound    left lower jaw; followed by ENT   Peripheral vascular disease ([place])    Right ureteral stone    SBO (small bowel obstruction) ([place]) 12/26/2019   Shingles 05-15-2015   Patient Active Problem List   Diagnosis Date Noted   Hypoxia 12/03/2021   PVC's (premature ventricular contractions) 11/19/2021   History of incisional hernia repair 10/03/2021   Incisional hernia 10/01/2021   PVD (peripheral vascular disease) ([place]) 08/29/2021   Cellulitis of chest wall 08/26/2021   Malignant pleural effusion 05/30/2021   Lung nodule 05/08/2021   Protein-calorie malnutrition, severe 12/28/2019   Leukopenia due to antineoplastic chemotherapy ([place]) 12/26/2019    Sepsis ([place]) 12/26/2019   Ventral hernia 12/26/2019   Chronic indwelling Foley catheter 12/26/2019   Encounter for antineoplastic immunotherapy 12/19/2019   Adenocarcinoma of left lung, stage 3 ([place]) 12/16/2019   Encounter for antineoplastic chemotherapy 11/30/2019   Goals of care, counseling/discussion 11/09/2019   Lung cancer, lingula ([place]) 11/01/2019   S/P partial lobectomy of lung 10/28/2019   Dyslipidemia 10/06/2019   Type 2 diabetes mellitus with complication, without long-term current use of insulin ([place]) 10/06/2019   Tobacco abuse 09/06/2019   Hypertension 09/06/2019   Nephrolithiasis 09/06/2019   Melanoma ([place]) 09/06/2019   Centrilobular emphysema ([place]) 05/23/2019   Stage 4 very severe COPD by GOLD classification ([place]) 05/23/2019   Atherosclerosis of native artery of both lower extremities with intermittent claudication ([place]) 05/26/2016   S/P aortobifemoral bypass surgery 05/26/2016   Ureteral stone with hydronephrosis 03/21/2014   Ulnar neuropathy 02/07/2014   Compulsive tobacco user syndrome 02/07/2014   Allergic rhinitis 02/07/2014   Home Medication(s) Prior to Admission medications   Medication Sig Start Date End Date Taking? Authorizing Provider  Amino Acids-Protein Hydrolys (FEEDING SUPPLEMENT, PRO-STAT SUGAR FREE 64,) LIQD Take 30 mLs by mouth in the morning.   Yes [provider]  Cholecalciferol (VITAMIN D-3) 1000 units CAPS Take 1,000 Units by mouth daily.    Yes [provider]  ezetimibe (ZETIA) 10 MG tablet Take 5 mg by mouth daily.   Yes [provider]  finasteride (PROSCAR) 5 MG tablet TAKE  1 TABLET BY MOUTH  DAILY Patient taking differently: Take 10 mg by mouth daily. 04/10/21  Yes Irine Seal, MD  gabapentin (NEURONTIN) 100 MG capsule Take 100-200 mg by mouth See admin instructions. 200 mg in the morning, 100 mg at bedtime. 03/26/20  Yes [provider]  Glucerna (GLUCERNA) LIQD Take 237 mLs by mouth 3 (three) times daily.    Yes [provider]  metFORMIN (GLUCOPHAGE) 500 MG tablet Take 1,000 mg by mouth in the morning and at bedtime.   Yes [provider]  polyethylene glycol powder (GLYCOLAX/MIRALAX) 17 GM/SCOOP powder Take 17 g by mouth every 12 (twelve) hours as needed (constipation).   Yes [provider]  rosuvastatin (CRESTOR) 40 MG tablet Take 40 mg by mouth daily.   Yes [provider]  Tiotropium Bromide-Olodaterol (STIOLTO RESPIMAT) 2.5-2.5 MCG/ACT AERS Inhale 2 puffs into the lungs daily. Patient taking differently: Inhale 1 puff into the lungs daily. 05/14/21  Yes Margaretha Seeds, MD  traMADol (ULTRAM) 50 MG tablet Take 1 tablet (50 mg total) by mouth every 6 (six) hours as needed (mild pain). Patient taking differently: Take 50 mg by mouth every 6 (six) hours as needed (pain). 01/20/20  Yes Swayze, Ava, DO  acetaminophen (TYLENOL) 500 MG tablet Take 2 tablets (1,000 mg total) by mouth every 6 (six) hours as needed for mild pain. Patient not taking: Reported on 09/25/2021 11/05/19   Barrett, Lodema Hong, PA-C  albuterol (VENTOLIN HFA) 108 (90 Base) MCG/ACT inhaler Inhale 2 puffs into the lungs every 6 (six) hours as needed for wheezing or shortness of breath. 05/06/21   Margaretha Seeds, MD  amLODipine (NORVASC) 10 MG tablet Take 10 mg by mouth daily. Patient not taking: Reported on 12/03/2021 11/15/20   [provider]  mupirocin ointment (BACTROBAN) 2 % Apply 1 application topically daily. With dressing changes. Patient not taking: Reported on 09/25/2021 07/04/21   Clayton Bibles, NP  nicotine (NICODERM CQ - DOSED IN MG/24 HOURS) 21 mg/24hr patch Place 1 patch (21 mg total) onto the skin daily. Patient not taking: Reported on 12/03/2021 10/08/21   Kinsinger, Arta Bruce, MD  olmesartan (BENICAR) 40 MG tablet Take 40 mg by mouth daily. Patient not taking: Reported on 12/03/2021 12/27/20   [provider]                                                                                                                                     Past Surgical History Past Surgical History:  Procedure Laterality Date   ABDOMINAL AORTAGRAM  11/01/2014   Procedure: Abdominal Aortagram;  Surgeon: Serafina Mitchell, MD;  Location: [facility];  Service: Cardiovascular;;   AORTA - BILATERAL FEMORAL ARTERY BYPASS GRAFT N/A 12/28/2014   Procedure: AORTOBIFEMORAL BYPASS GRAFT;  Surgeon: Serafina Mitchell, MD;  Location: [facility];  Service: Vascular;  Laterality: N/A;   BRONCHIAL BRUSHINGS  09/06/2019  Procedure: BRONCHIAL BRUSHINGS;  Surgeon: Collene Gobble, MD;  Location: [facility] ENDOSCOPY;  Service: Pulmonary;;  left upper lobe nodule   BRONCHIAL WASHINGS  09/06/2019   Procedure: BRONCHIAL WASHINGS;  Surgeon: Collene Gobble, MD;  Location: [facility] ENDOSCOPY;  Service: Pulmonary;;   CHEST TUBE INSERTION N/A 06/20/2021   Procedure: INSERTION PLEURAL DRAINAGE CATHETER;  Surgeon: Margaretha Seeds, MD;  Location: [facility] ENDOSCOPY;  Service: Cardiopulmonary;  Laterality: N/A;   COLONOSCOPY W/ POLYPECTOMY  09-13-2013   CYSTOLITHALOPAXY OF BLADDER STONE/ TRANSRECTAL ULTRASOUND PROSTATE BX  08-19-2005   CYSTOSCOPY W/ RETROGRADES Right 12/02/2012   Procedure: CYSTOSCOPY WITH RETROGRADE PYELOGRAM;  Surgeon: Malka So, MD;  Location: [facility];  Service: Urology;  Laterality: Right;   CYSTOSCOPY W/ RETROGRADES Bilateral 10/13/2013   Procedure: CYSTOSCOPY WITH RETROGRADE PYELOGRAM;  Surgeon: Irine Seal, MD;  Location: [facility];  Service: Urology;  Laterality: Bilateral;   CYSTOSCOPY W/ URETERAL STENT PLACEMENT Left 04/11/2014   Procedure: CYSTOSCOPY WITH STENT REPLACEMENT;  Surgeon: Malka So, MD;  Location: [facility];  Service: Urology;  Laterality: Left;   CYSTOSCOPY WITH LITHOLAPAXY Right 12/02/2012   Procedure: CYSTOSCOPY WITH stone extraction;  Surgeon: Malka So, MD;  Location: [facility];  Service: Urology;  Laterality:  Right;   CYSTOSCOPY WITH LITHOLAPAXY Right 10/13/2013   Procedure: CYSTOSCOPY WITH LITHOLAPAXY, removal of urethral stone with basket;  Surgeon: Irine Seal, MD;  Location: [facility];  Service: Urology;  Laterality: Right;   CYSTOSCOPY WITH RETROGRADE PYELOGRAM, URETEROSCOPY AND STENT PLACEMENT Left 03/21/2014   Procedure: CYSTOSCOPY WITH RETROGRADE PYELOGRAM, AND LEFT STENT PLACEMENT;  Surgeon: Alexis Frock, MD;  Location: [facility];  Service: Urology;  Laterality: Left;   CYSTOSCOPY WITH RETROGRADE PYELOGRAM, URETEROSCOPY AND STENT PLACEMENT  09/09/2016   Procedure: CYSTOSCOPY WITH RIGHT  RETROGRADE PYELOGRAM, URETEROSCOPY WITH LASER BASKET EXTRACTION OF STONES  AND STENT PLACEMENT;  Surgeon: Irine Seal, MD;  Location: [facility];  Service: Urology;;   CYSTOSCOPY WITH URETEROSCOPY Left 04/11/2014   Procedure: CYSTOSCOPY WITH URETEROSCOPY/ STONE EXTRACTION;  Surgeon: Malka So, MD;  Location: [facility];  Service: Urology;  Laterality: Left;   FINE NEEDLE ASPIRATION  09/06/2019   Procedure: FINE NEEDLE ASPIRATION;  Surgeon: Collene Gobble, MD;  Location: [facility] ENDOSCOPY;  Service: Pulmonary;;  left upper lobe - lung   HOLMIUM LASER APPLICATION Right 09/05/4006   Procedure: HOLMIUM LASER APPLICATION;  Surgeon: Malka So, MD;  Location: [facility];  Service: Urology;  Laterality: Right;   HOLMIUM LASER APPLICATION Left 67/61/9509   Procedure: HOLMIUM LASER APPLICATION;  Surgeon: Malka So, MD;  Location: [facility];  Service: Urology;  Laterality: Left;   HOLMIUM LASER APPLICATION Right 09/16/7122   Procedure: HOLMIUM LASER APPLICATION;  Surgeon: Irine Seal, MD;  Location: [facility];  Service: Urology;  Laterality: Right;   INCISIONAL HERNIA REPAIR N/A 10/01/2021   Procedure: INCISIONAL HERNIA REPAIR WITH MESH;  Surgeon: Kinsinger, Arta Bruce, MD;  Location: [facility];  Service: General;  Laterality: N/A;    INGUINAL HERNIA REPAIR Right 1954   INTERCOSTAL NERVE BLOCK Left 10/28/2019   Procedure: Intercostal Nerve Block;  Surgeon: [name], MD;  Location: [facility];  Service: Thoracic;  Laterality: Left;   LITHOTRIPSY  2007 & 02/2013   LUNG BIOPSY  09/06/2019   Procedure: LUNG BIOPSY;  Surgeon: Collene Gobble, MD;  Location: [facility] ENDOSCOPY;  Service: Pulmonary;;  left upper lob lung   MANDIBLE FRACTURE SURGERY  1995   MICROLARYNGOSCOPY Right 02/18/2016   Procedure: MICRO DIRECT LARYNGOSCOPY EXCISIONAL BIOPSY OF RIGHT VOCAL CORD MASS;  Surgeon: Leta Baptist, MD;  Location: [facility];  Service: ENT;  Laterality: Right;   [place]   left ear   MULTIPLE TOOTH EXTRACTIONS  1994   NEPHROLITHOTOMY Left 01/27/2013   Procedure: NEPHROLITHOTOMY PERCUTANEOUS FIRST LOOK;  Surgeon: Malka So, MD;  Location: [facility];  Service: Urology;  Laterality: Left;   NODE DISSECTION  10/28/2019   Procedure: Node Dissection;  Surgeon: [name], MD;  Location: [facility];  Service: Thoracic;;   PERIPHERAL VASCULAR CATHETERIZATION N/A 11/01/2014   Procedure: Lower Extremity Angiography;  Surgeon: Serafina Mitchell, MD;  Location: [facility];  Service: Cardiovascular;  Laterality: N/A;   URETEROSCOPY Right 12/02/2012   Procedure: RIGHT URETEROSCOPY STONE EXTRACTION WITH  STENT PLACEMENT;  Surgeon: Malka So, MD;  Location: [facility];  Service: Urology;  Laterality: Right;   VIDEO BRONCHOSCOPY  10/28/2019   VIDEO BRONCHOSCOPY WITH ENDOBRONCHIAL NAVIGATION WITH LUNG MARKINGS (N/A   VIDEO BRONCHOSCOPY WITH ENDOBRONCHIAL NAVIGATION N/A 09/06/2019   Procedure: VIDEO BRONCHOSCOPY WITH ENDOBRONCHIAL NAVIGATION;  Surgeon: Collene Gobble, MD;  Location: [facility];  Service: Pulmonary;  Laterality: N/A;   VIDEO BRONCHOSCOPY WITH ENDOBRONCHIAL NAVIGATION N/A 10/28/2019   Procedure: VIDEO BRONCHOSCOPY WITH ENDOBRONCHIAL NAVIGATION WITH LUNG MARKINGS;  Surgeon: [name], MD;   Location: [facility];  Service: Thoracic;  Laterality: N/A;   WEDGE RESECTION  10/28/2019   LEFT UPPER LOBE   Family History Family History  Problem Relation Age of Onset   Dementia Mother    Hypertension Mother    Stroke Mother 57   Dementia Father    Hypertension Sister    Colon cancer Neg Hx     Social History Social History   Tobacco Use   Smoking status: Every Day    Packs/day: 2.00    Years: 60.00    Total pack years: 120.00    Types: Cigarettes    Start date: 06/24/1959   Smokeless tobacco: Never   Tobacco comments:    a little over a pack per day  Vaping Use   Vaping Use: Former  Substance Use Topics   Alcohol use: No    Alcohol/week: 0.0 standard drinks of alcohol   Drug use: No   Allergies Morphine and related, Neurontin [gabapentin], Tape, Allevyn adhesive [wound dressings], Amoxil [amoxicillin], and Naprosyn [naproxen]  Review of Systems Review of Systems  Unable to perform ROS: Severe respiratory distress    Physical Exam Vital Signs  I have reviewed the triage vital signs BP 109/79   Pulse (!) 106   Temp 97.9 F (36.6 C) (Axillary)   Resp (!) 23   SpO2 100%   Physical Exam Constitutional:      General: He is in acute distress.     Appearance: Normal appearance. He is ill-appearing.  HENT:     Head: Normocephalic and atraumatic.     Nose: No congestion or rhinorrhea.  Eyes:     General:        Right eye: No discharge.        Left eye: No discharge.     Extraocular Movements: Extraocular movements intact.     Pupils: Pupils are equal, round, and reactive to light.  Cardiovascular:     Rate and Rhythm: Normal rate and regular rhythm.  Heart sounds: No murmur heard. Pulmonary:     Effort: Tachypnea, accessory muscle usage and respiratory distress present.     Breath sounds: Decreased breath sounds and rales present. No wheezing.  Abdominal:     General: There is no distension.     Tenderness: There is no abdominal tenderness.   Musculoskeletal:        General: Normal range of motion.     Cervical back: Normal range of motion.  Skin:    General: Skin is warm and dry.  Neurological:     General: No focal deficit present.     Mental Status: He is alert.     ED Results and Treatments Labs (all labs ordered are listed, but only abnormal results are displayed) Labs Reviewed  COMPREHENSIVE METABOLIC PANEL - Abnormal; Notable for the following components:      Result Value   CO2 21 (*)    Glucose, Bld 239 (*)    BUN 49 (*)    Creatinine, Ser 1.61 (*)    Total Protein 6.1 (*)    Albumin 2.8 (*)    GFR, Estimated 45 (*)    All other components within normal limits  CBC WITH DIFFERENTIAL/PLATELET - Abnormal; Notable for the following components:   WBC 12.0 (*)    Neutro Abs 9.4 (*)    Abs Immature Granulocytes 0.08 (*)    All other components within normal limits  I-STAT VENOUS BLOOD GAS, ED - Abnormal; Notable for the following components:   pH, Ven 7.222 (*)    pCO2, Ven 61.0 (*)    pO2, Ven 183 (*)    Acid-base deficit 4.0 (*)    All other components within normal limits  TROPONIN I (HIGH SENSITIVITY) - Abnormal; Notable for the following components:   Troponin I (High Sensitivity) 41 (*)    All other components within normal limits  MRSA NEXT GEN BY PCR, NASAL  CULTURE, BLOOD (ROUTINE X 2)  CULTURE, BLOOD (ROUTINE X 2)  URINE CULTURE  SARS CORONAVIRUS 2 BY RT PCR  RESPIRATORY PANEL BY PCR  BRAIN NATRIURETIC PEPTIDE  PROTIME-INR  APTT  BLOOD GAS, VENOUS  LEGIONELLA PNEUMOPHILA SEROGP 1 UR AG  LACTIC ACID, PLASMA  LACTIC ACID, PLASMA  URINALYSIS, ROUTINE W REFLEX MICROSCOPIC  STREP PNEUMONIAE URINARY ANTIGEN  TROPONIN I (HIGH SENSITIVITY)                                                                                                                          Radiology CT Angio Chest PE W and/or Wo Contrast  Result Date: 12/03/2021 CLINICAL DATA:  Shortness of breath.  Hypoxia. EXAM: CT  ANGIOGRAPHY CHEST WITH CONTRAST TECHNIQUE: Multidetector CT imaging of the chest was performed using the standard protocol during bolus administration of intravenous contrast. Multiplanar CT image reconstructions and MIPs were obtained to evaluate the vascular anatomy. RADIATION DOSE REDUCTION: This exam was performed according to the departmental dose-optimization program which includes automated exposure control, adjustment of  the mA and/or kV according to patient size and/or use of iterative reconstruction technique. CONTRAST:  50mL OMNIPAQUE IOHEXOL 350 MG/ML SOLN COMPARISON:  October 03, 2021. FINDINGS: Cardiovascular: Satisfactory opacification of the pulmonary arteries to the segmental level. No evidence of pulmonary embolism. Normal heart size. No pericardial effusion. Atherosclerosis of thoracic aorta is noted without aneurysm formation. Mediastinum/Nodes: No enlarged mediastinal, hilar, or axillary lymph nodes. Thyroid gland, trachea, and esophagus demonstrate no significant findings. Lungs/Pleura: Emphysematous disease is noted throughout both lungs. No pneumothorax is noted. Significant increased left basilar opacities are noted concerning for pneumonia or possibly atelectasis. There appears to be aspirated material within the left mainstem bronchus and more distal bronchi. Volume loss is noted on the left resulting in some mediastinal shift to the left. Stable scarring or inflammation is noted in the left upper lobe. Aspirated material or mucous plugging is noted in right lower lobe bronchi. Upper Abdomen: Cholelithiasis. Musculoskeletal: No chest wall abnormality. No acute or significant osseous findings. Review of the MIP images confirms the above findings. IMPRESSION: No definite evidence of pulmonary embolus. Significantly increased opacity is seen in the left lung base consistent with worsening pneumonia or possibly atelectasis. This results in volume loss and mediastinal shift to the left. There  does appear to be aspirated material or fluid within the left mainstem bronchus and more peripheral bronchi. There is also noted aspirated material or mucous plugging in right lower lobe bronchi. Aortic Atherosclerosis (ICD10-I70.0) and Emphysema (ICD10-J43.9). Electronically Signed   By: Marijo Conception M.D.   On: 12/03/2021 14:46   DG Chest Portable 1 View  Result Date: 12/03/2021 CLINICAL DATA:  Dyspnea EXAM: PORTABLE CHEST 1 VIEW COMPARISON:  10/03/2021 FINDINGS: Persistent left pleural effusion. Increased opacification of the left lung. Right lung remains essentially clear. Similar cardiomediastinal contours. IMPRESSION: Persistent left pleural effusion and increased opacification of the left lung. Electronically Signed   By: Macy Mis M.D.   On: 12/03/2021 13:07    Pertinent labs & imaging results that were available during my care of the patient were reviewed by me and considered in my medical decision making (see MDM for details).  Medications Ordered in ED Medications  ceFEPIme (MAXIPIME) 2 g in sodium chloride 0.9 % 100 mL IVPB (2 g Intravenous New Bag/Given 12/03/21 1447)  vancomycin (VANCOREADY) IVPB 1500 mg/300 mL (has no administration in time range)  lactated ringers infusion (has no administration in time range)  0.9 %  sodium chloride infusion (has no administration in time range)  lactated ringers bolus 1,000 mL (1,000 mLs Intravenous New Bag/Given 12/03/21 1429)    And  lactated ringers bolus 1,000 mL (1,000 mLs Intravenous Not Given 12/03/21 1430)    And  lactated ringers bolus 250 mL (has no administration in time range)  azithromycin (ZITHROMAX) 500 mg in sodium chloride 0.9 % 250 mL IVPB (500 mg Intravenous New Bag/Given 12/03/21 1447)  norepinephrine (LEVOPHED) 4mg  in 276mL (0.016 mg/mL) premix infusion (12 mcg/min Intravenous New Bag/Given 12/03/21 1432)  docusate sodium (COLACE) capsule 100 mg (has no administration in time range)  polyethylene glycol (MIRALAX /  GLYCOLAX) packet 17 g (has no administration in time range)  heparin injection 5,000 Units (has no administration in time range)  vasopressin (PITRESSIN) 20 Units in sodium chloride 0.9 % 100 mL infusion-*FOR SHOCK* (0.03 Units/min Intravenous New Bag/Given 12/03/21 1434)  insulin aspart (novoLOG) injection 0-9 Units (has no administration in time range)  arformoterol (BROVANA) nebulizer solution 15 mcg (has no administration in time range)  revefenacin (YUPELRI) nebulizer solution 175 mcg (has no administration in time range)  methylPREDNISolone sodium succinate (SOLU-MEDROL) 40 mg/mL injection 40 mg (has no administration in time range)  ipratropium-albuterol (DUONEB) 0.5-2.5 (3) MG/3ML nebulizer solution 3 mL (has no administration in time range)  lactated ringers bolus 1,000 mL (1,000 mLs Intravenous New Bag/Given 12/03/21 1317)  PHENYLephrine 80 mcg/ml in normal saline Adult IV Push Syringe (For Blood Pressure Support) (120 mcg Intravenous Given 12/03/21 1359)  vasopressin 20 units/100 mL infusion SOLN (20 Units  New Bag/Given 12/03/21 1359)  iohexol (OMNIPAQUE) 350 MG/ML injection 80 mL (80 mLs Intravenous Contrast Given 12/03/21 1427)                                                                                                                                     Procedures .Critical Care  Performed by: Teressa Lower, MD Authorized by: Teressa Lower, MD   Critical care provider statement:    Critical care time (minutes):  75   Critical care was necessary to treat or prevent imminent or life-threatening deterioration of the following conditions:  Respiratory failure and circulatory failure   Critical care was time spent personally by me on the following activities:  Development of treatment plan with patient or surrogate, discussions with consultants, evaluation of patient's response to treatment, examination of patient, ordering and review of laboratory studies, ordering and review of  radiographic studies, ordering and performing treatments and interventions, pulse oximetry, re-evaluation of patient's condition and review of old charts   (including critical care time)  Medical Decision Making / ED Course   This patient presents to the ED for concern of shortness of breath, this involves an extensive number of treatment options, and is a complaint that carries with it a high risk of complications and morbidity.  The differential diagnosis includes pleural effusion, pulmonary embolism, aortic dissection, progression of underlying lung cancer, COPD exacerbation  MDM: Patient seen emergency room for evaluation of shortness of breath.  Physical exam reveals a ill-appearing cachectic patient with accessory muscle use and decreased breath sounds on the left.  We attempted to transition the patient off of the nonrebreather but patient became hypoxic and in the setting of his increased accessory muscle use and respiratory distress he was transitioned to BiPAP.  Oxygenation improved with BiPAP.  Initial pH 7.22, PCO2 61, leukocytosis to 12.0 with a initial troponin elevated to 41.  Chest x-ray shows a persistent left pleural effusion with increased opacification of the left lung.  I had initial concerns for worsening respiratory status secondary to his pleural effusion and had prepared to perform a bedside thoracentesis emergently, and consulted critical care/pulmonary to assist with this.  However, upon their arrival, it is clear that the patient does not have a great window to perform this procedure and likely has adhesions that would complicate this.  ICU rightly had concerns for acute pulmonary embolism given significant hypoxia and worsening  hypotension.  Patient was started on Levophed and vasopressin as well as fluid resuscitation and antibiotics.  PE study obtained emergently which was reassuringly negative for PE, but does show a significantly increased opacity in the left lung base  with aspirated material or fluid in the left mainstream bronchus and left-sided mediastinal shift as well as aspirated material/mucous plugging in the right lower lobe.  Chemistry then resulted with a BUN of 49, creatinine 1.61 but otherwise largely unremarkable.  Throughout this resuscitation, I called the patient's wife who indicated that the patient would like to remain a full code if possible and would want intubation and CPR.  Patient then admitted to the ICU.    Additional history obtained: -Additional history obtained from wife -External records from outside source obtained and reviewed including: Chart review including previous notes, labs, imaging, consultation notes   Lab Tests: -I ordered, reviewed, and interpreted labs.   The pertinent results include:   Labs Reviewed  COMPREHENSIVE METABOLIC PANEL - Abnormal; Notable for the following components:      Result Value   CO2 21 (*)    Glucose, Bld 239 (*)    BUN 49 (*)    Creatinine, Ser 1.61 (*)    Total Protein 6.1 (*)    Albumin 2.8 (*)    GFR, Estimated 45 (*)    All other components within normal limits  CBC WITH DIFFERENTIAL/PLATELET - Abnormal; Notable for the following components:   WBC 12.0 (*)    Neutro Abs 9.4 (*)    Abs Immature Granulocytes 0.08 (*)    All other components within normal limits  I-STAT VENOUS BLOOD GAS, ED - Abnormal; Notable for the following components:   pH, Ven 7.222 (*)    pCO2, Ven 61.0 (*)    pO2, Ven 183 (*)    Acid-base deficit 4.0 (*)    All other components within normal limits  TROPONIN I (HIGH SENSITIVITY) - Abnormal; Notable for the following components:   Troponin I (High Sensitivity) 41 (*)    All other components within normal limits  MRSA NEXT GEN BY PCR, NASAL  CULTURE, BLOOD (ROUTINE X 2)  CULTURE, BLOOD (ROUTINE X 2)  URINE CULTURE  SARS CORONAVIRUS 2 BY RT PCR  RESPIRATORY PANEL BY PCR  BRAIN NATRIURETIC PEPTIDE  PROTIME-INR  APTT  BLOOD GAS, VENOUS  LEGIONELLA  PNEUMOPHILA SEROGP 1 UR AG  LACTIC ACID, PLASMA  LACTIC ACID, PLASMA  URINALYSIS, ROUTINE W REFLEX MICROSCOPIC  STREP PNEUMONIAE URINARY ANTIGEN  TROPONIN I (HIGH SENSITIVITY)      EKG   EKG Interpretation  Date/Time:  Tuesday December 03 2021 13:03:46 EDT Ventricular Rate:  142 PR Interval:  139 QRS Duration: 82 QT Interval:  277 QTC Calculation: 426 R Axis:   41 Text Interpretation: Sinus tachycardia Confirmed by Deionte Spivack ([id number]) on 12/03/2021 3:55:00 PM         Imaging Studies ordered: I ordered imaging studies including chest x-ray, CT PE I independently visualized and interpreted imaging. I agree with the radiologist interpretation   Medicines ordered and prescription drug management: Meds ordered this encounter  Medications   lactated ringers bolus 1,000 mL   DISCONTD: norepinephrine (LEVOPHED) 4mg  in 2103mL (0.016 mg/mL) premix infusion   PHENYLephrine 80 mcg/ml in normal saline Adult IV Push Syringe (For Blood Pressure Support)   phenylephrine 80 mcg/10 mL injection    Cobb, Cameron E: cabinet override   norepinephrine (LEVOPHED) 4-5 MG/250ML-% infusion SOLN    Cobb, Cameron E: cabinet override  vasopressin 20 units/100 mL infusion Neita Goodnight, Emilee M: cabinet override   ceFEPIme (MAXIPIME) 2 g in sodium chloride 0.9 % 100 mL IVPB    Order Specific Question:   Antibiotic Indication:    Answer:   Sepsis   vancomycin (VANCOREADY) IVPB 1500 mg/300 mL    Order Specific Question:   Indication:    Answer:   Sepsis   DISCONTD: azithromycin (ZITHROMAX) 500 mg in sodium chloride 0.9 % 250 mL IVPB    Order Specific Question:   Antibiotic Indication:    Answer:   CAP   lactated ringers infusion   0.9 %  sodium chloride infusion   AND Linked Order Group    lactated ringers bolus 1,000 mL     Order Specific Question:   Enter Patient Weight in Kilograms     Answer:   65.8    lactated ringers bolus 1,000 mL     Order Specific Question:   Enter Patient Weight  in Kilograms     Answer:   65.8    lactated ringers bolus 250 mL     Order Specific Question:   Enter Patient Weight in Kilograms     Answer:   65.8   azithromycin (ZITHROMAX) 500 mg in sodium chloride 0.9 % 250 mL IVPB    Order Specific Question:   Antibiotic Indication:    Answer:   CAP   norepinephrine (LEVOPHED) 4mg  in 240mL (0.016 mg/mL) premix infusion    Order Specific Question:   IV Access    Answer:   Peripheral   docusate sodium (COLACE) capsule 100 mg   polyethylene glycol (MIRALAX / GLYCOLAX) packet 17 g   heparin injection 5,000 Units   vasopressin (PITRESSIN) 20 Units in sodium chloride 0.9 % 100 mL infusion-*FOR SHOCK*   insulin aspart (novoLOG) injection 0-9 Units    Order Specific Question:   Correction coverage:    Answer:   Sensitive (thin, NPO, renal)    Order Specific Question:   CBG < 70:    Answer:   Implement Hypoglycemia Standing Orders and refer to Hypoglycemia Standing Orders sidebar report    Order Specific Question:   CBG 70 - 120:    Answer:   0 units    Order Specific Question:   CBG 121 - 150:    Answer:   1 unit    Order Specific Question:   CBG 151 - 200:    Answer:   2 units    Order Specific Question:   CBG 201 - 250:    Answer:   3 units    Order Specific Question:   CBG 251 - 300:    Answer:   5 units    Order Specific Question:   CBG 301 - 350:    Answer:   7 units    Order Specific Question:   CBG 351 - 400    Answer:   9 units    Order Specific Question:   CBG > 400    Answer:   call MD and obtain STAT lab verification   DISCONTD: ipratropium-albuterol (DUONEB) 0.5-2.5 (3) MG/3ML nebulizer solution 3 mL   iohexol (OMNIPAQUE) 350 MG/ML injection 80 mL   arformoterol (BROVANA) nebulizer solution 15 mcg   revefenacin (YUPELRI) nebulizer solution 175 mcg   methylPREDNISolone sodium succinate (SOLU-MEDROL) 40 mg/mL injection 40 mg    IV methylprednisolone will be converted to either a q12h or q24h frequency with the same total daily dose  (  TDD).  Ordered Dose: 1 to 125 mg TDD; convert to: TDD q24h.  Ordered Dose: 126 to 250 mg TDD; convert to: TDD div q12h.  Ordered Dose: >250 mg TDD; DAW.   ipratropium-albuterol (DUONEB) 0.5-2.5 (3) MG/3ML nebulizer solution 3 mL    -I have reviewed the patients home medicines and have made adjustments as needed  Critical interventions Multiple pressors, antibiotics, fluid resuscitation, BiPAP  Consultations Obtained: I requested consultation with the intensivist,  and discussed lab and imaging findings as well as pertinent plan - they recommend: Stat PE study and ICU admission   Cardiac Monitoring: The patient was maintained on a cardiac monitor.  I personally viewed and interpreted the cardiac monitored which showed an underlying rhythm of: Sinus tachycardia  Social Determinants of Health:  Factors impacting patients care include: none   Reevaluation: After the interventions noted above, I reevaluated the patient and found that they have :improved  Co morbidities that complicate the patient evaluation  Past Medical History:  Diagnosis Date   BPH (benign prostatic hypertrophy)    weak stream   Chronic kidney disease    Complication of anesthesia    urinary retention   COPD (chronic obstructive pulmonary disease) (HCC)    Diabetes mellitus without complication (HCC)    Type II   History of bladder stone    History of colon polyps    08/2013   pre-canerous   History of kidney stones    History of melanoma excision    LEFT EAR   Hyperlipidemia    Hypertension    Iron deficiency    Lung cancer, lingula ([place]) 11/01/2019   Open wound    left lower jaw; followed by ENT   Peripheral vascular disease ([place])    Right ureteral stone    SBO (small bowel obstruction) ([place]) 12/26/2019   Shingles 05-15-2015      Dispostion: I considered admission for this patient, and given respiratory failure patient will require ICU admission     Final Clinical Impression(s) / ED  Diagnoses Final diagnoses:  None     @PCDICTATION @    Teressa Lower, MD 12/03/21 1556

## 2021-12-03 NOTE — ED Provider Notes (Signed)
Patient will require emergent CT imaging with contrast to rule out life-threatening pulmonary embolism.  This will need to occur prior to labs returning and the benefit of addressing this life-threatening critical pathology outweighs the risk of possible contrast-induced nephropathy.  This is of medical necessity.   Teressa Lower, MD 12/03/21 1414

## 2021-12-03 NOTE — H&P (Signed)
NAME:  Chad Gomez, MRN:  [id number], DOB:  10/08/1945, LOS: 0 ADMISSION DATE:  12/03/2021, CONSULTATION DATE:  6/13 REFERRING MD:  Ester Rink, CHIEF COMPLAINT:  respiratory failure   History of Present Illness:  Chad Gomez is a 76 y/o gentleman with severe COPD and stage IV NSCLC with previous MPE, s/p pleurX Dec 2022- Jan 2023 who presented from [facility] to the ED with SOB, hypoxia, tachycardia, and hypotension. He was found by his nurse this way this morning, but has not been on antibiotics or feeling poorly recently. He is unable to provide additional history due to respiratory failure and ned for bipap. He is not on blood thinners at baseline and has not recently had antibiotics per his RN. Per review of MAR, he had one dose of IM ceftriaxone on 6/5.  He is not regularly on oxygen. When EMS arrived he was saturating 70% on RA and required NRB for transfer to [facility].  His NSCLC was treated with LUL lobectomy in 2021 but he was intolerant to chemo, stopping after 2 cycles. He progressed to stage IV with MPE in 2022. PleurX placed in Dec 2022 and d/c after achieving spontaneous pleurodesis in late January 2023. As of March 2023, he continued to smoke.   Chad Gomez is followed by [place], most recently on 6/6. He is full code. He has deconditioning requiring SNF since discharge from the hospital in April for elective hernia repair. In the 1-2 weeks prior to that surgery he had lost about 40# from not eating much due to pain per his wife. His last oncology appointment was in February 2023, at which time he was offered either palliative chemo or hospice referral. He is not currently on therapy.     Pertinent  Medical History  Stage IV NSCLC with mets to pleura & MPE; h/o previous LUL resection in 2021 COPD cachexia DM2 Melanoma Chronic indwelling foley PAD  Significant Hospital Events: Including procedures, antibiotic start and stop dates in addition  to other pertinent events   6/13 admission to ICU, started on cefepime, vanc, azithromycin, pressors  Interim History / Subjective:    Objective   Blood pressure (!) 82/70, pulse (!) 133, temperature 97.9 F (36.6 C), temperature source Axillary, resp. rate (!) 30, SpO2 95 %.    FiO2 (%):  [40 %] 40 %  No intake or output data in the 24 hours ending 12/03/21 1537 There were no vitals filed for this visit.  Examination: General: cachectic chronically ill appearing man lying in bed in NAD HENT: [place]/AT, eyes anicteric Lungs: minimal breath sounds, breathing comfortably on BiPAP Cardiovascular: S1S2, tachycardic, reg rhythm. No JVD. Abdomen: thin, soft Extremities: no peripheral edema, severe muscle wasting Neuro: awake, alert, unable to answer many questions due to bipap mask GU: chronic indwelling foley  CXR personally reviewed> volume loss on the left and ongoing opacification on the left lower hemithorax. Compensatory hyperexpansion of the right lung.  CTA personally reviewed & compared to 09/2021 CTA> no PE, persistent abnormal left hemithorax with volume loss from LU lobectomy, persistent density throughout LLL concerning for lymphangitic spread of cancer and thickened areas of pleura concerning for enlarging pleural mets. No significant pleural effusion, mostly subpulmonic. Severe emphysema. Resolved R dependent effusion since April.  EKG> low voltage, sinus tach, normal axis. No obvious ischemic changes.  BUN 41 Cr 1.61 Trop 41 WBC 12 ABG    Component Value Date/Time   PHART 7.407 10/29/2019 0430   PCO2ART 41.5 10/29/2019  0430   PO2ART 66.5 (L) 10/29/2019 0430   HCO3 25.1 12/03/2021 1344   TCO2 27 12/03/2021 1344   ACIDBASEDEF 4.0 (H) 12/03/2021 1344   O2SAT 99 12/03/2021 1344      Resolved Hospital Problem list     Assessment & Plan:  Acute respiratory failure with hypoxia & hypercapnia Concern for CAP Severe COPD with acute exacerbation Stage IV lung cancer  s/p LUL lobectomy Previous MPE, s/p pleruX with self-pleurodesis -BiPAP; can try transitioning to [facility] -empiric antibiotics> cefepime, vanc, azithromycin. Check legionella, pneumococcus, covid, RVP. -MRSA nasal -brovana, yupelri + PRN duonebs -steroids  Shock, presumed septic -pressors to maintain MAP >65 -empiric antibiotics -serial trop to r/o ACS, but EKG is reassuring -check LA -monitor on tele -CTA to evaluate for PE> no PE  Cachexia, severe protein energy malnutrition -needs GoC discussions  Hyperglycemia -SSI PRN -goal BG 140-180  AKI, presumed pre-renal from sepsis -volume resuscitation -strict I/Os -renally dose meds, avoid nephrotoxic meds -con't to monitor  BPH, chronic foley for urinary retention -change foley at admission -UA & urine culture  Guarded prognosis due to poor performance status, severe underlying comorbidities.  Consult to Palliative Care with the wife's permission. See IPAL note today.   Best Practice (right click and "Reselect all SmartList Selections" daily)   Diet/type: NPO DVT prophylaxis: prophylactic heparin  GI prophylaxis: N/A Lines: N/A Foley:  Yes, and it is still needed> chronic foley, changed Code Status:  full code Last date of multidisciplinary goals of care discussion Chad Gomez, see IPAL note 6/13]  Labs   CBC: Recent Labs  Lab 12/03/21 1316  WBC 12.0*  NEUTROABS 9.4*  HGB 13.1  HCT 42.0  MCV 95.7  PLT 630    Basic Metabolic Panel: No results for input(s): "NA", "K", "CL", "CO2", "GLUCOSE", "BUN", "CREATININE", "CALCIUM", "MG", "PHOS" in the last 168 hours. GFR: CrCl cannot be calculated (Patient's most recent lab result is older than the maximum 21 days allowed.). Recent Labs  Lab 12/03/21 1316  WBC 12.0*    Liver Function Tests: No results for input(s): "AST", "ALT", "ALKPHOS", "BILITOT", "PROT", "ALBUMIN" in the last 168 hours. No results for input(s): "LIPASE", "AMYLASE" in the last 168 hours. No  results for input(s): "AMMONIA" in the last 168 hours.  ABG    Component Value Date/Time   PHART 7.407 10/29/2019 0430   PCO2ART 41.5 10/29/2019 0430   PO2ART 66.5 (L) 10/29/2019 0430   HCO3 25.6 10/29/2019 0430   TCO2 33 (H) 10/28/2019 1501   ACIDBASEDEF 1.0 10/28/2019 1501   O2SAT 94.1 10/29/2019 0430     Coagulation Profile: No results for input(s): "INR", "PROTIME" in the last 168 hours.  Cardiac Enzymes: No results for input(s): "CKTOTAL", "CKMB", "CKMBINDEX", "TROPONINI" in the last 168 hours.  HbA1C: Hgb A1c MFr Bld  Date/Time Value Ref Range Status  09/26/2021 09:22 AM 5.8 (H) 4.8 - 5.6 % Final    Comment:    (NOTE) Pre diabetes:          5.7%-6.4%  Diabetes:              >6.4%  Glycemic control for   <7.0% adults with diabetes   12/26/2019 10:04 AM 6.1 (H) 4.8 - 5.6 % Final    Comment:    (NOTE) Pre diabetes:          5.7%-6.4%  Diabetes:              >6.4%  Glycemic control for   <7.0% adults with  diabetes     CBG: No results for input(s): "GLUCAP" in the last 168 hours.  Review of Systems:   Limited due to respiratory distress.  Past Medical History:  He,  has a past medical history of BPH (benign prostatic hypertrophy), Chronic kidney disease, Complication of anesthesia, COPD (chronic obstructive pulmonary disease) ([place]), Diabetes mellitus without complication ([place]), History of bladder stone, History of colon polyps, History of kidney stones, History of melanoma excision, Hyperlipidemia, Hypertension, Iron deficiency, Lung cancer, lingula ([place]) (11/01/2019), Open wound, Peripheral vascular disease ([place]), Right ureteral stone, SBO (small bowel obstruction) ([place]) (12/26/2019), and Shingles (05-15-2015).   Surgical History:   Past Surgical History:  Procedure Laterality Date   ABDOMINAL AORTAGRAM  11/01/2014   Procedure: Abdominal Aortagram;  Surgeon: Serafina Mitchell, MD;  Location: [place] [facility];  Service: Cardiovascular;;   AORTA - BILATERAL  FEMORAL ARTERY BYPASS GRAFT N/A 12/28/2014   Procedure: AORTOBIFEMORAL BYPASS GRAFT;  Surgeon: Serafina Mitchell, MD;  Location: [place];  Service: Vascular;  Laterality: N/A;   BRONCHIAL BRUSHINGS  09/06/2019   Procedure: BRONCHIAL BRUSHINGS;  Surgeon: Collene Gobble, MD;  Location[facility] ENDOSCOPY;  Service: Pulmonary;;  left upper lobe nodule   BRONCHIAL WASHINGS  09/06/2019   Procedure: BRONCHIAL WASHINGS;  Surgeon: Collene Gobble, MD;  Location: [facility] ENDOSCOPY;  Service: Pulmonary;;   CHEST TUBE INSERTION N/A 06/20/2021   Procedure: INSERTION PLEURAL DRAINAGE CATHETER;  Surgeon: Margaretha Seeds, MD;  Location: [facility];  Service: Cardiopulmonary;  Laterality: N/A;   COLONOSCOPY W/ POLYPECTOMY  09-13-2013   CYSTOLITHALOPAXY OF BLADDER STONE/ TRANSRECTAL ULTRASOUND PROSTATE BX  08-19-2005   CYSTOSCOPY W/ RETROGRADES Right 12/02/2012   Procedure: CYSTOSCOPY WITH RETROGRADE PYELOGRAM;  Surgeon: Malka So, MD;  Location: [facility];  Service: Urology;  Laterality: Right;   CYSTOSCOPY W/ RETROGRADES Bilateral 10/13/2013   Procedure: CYSTOSCOPY WITH RETROGRADE PYELOGRAM;  Surgeon: Irine Seal, MD;  Location: [facility];  Service: Urology;  Laterality: Bilateral;   CYSTOSCOPY W/ URETERAL STENT PLACEMENT Left 04/11/2014   Procedure: CYSTOSCOPY WITH STENT REPLACEMENT;  Surgeon: Malka So, MD;  Location: [facility];  Service: Urology;  Laterality: Left;   CYSTOSCOPY WITH LITHOLAPAXY Right 12/02/2012   Procedure: CYSTOSCOPY WITH stone extraction;  Surgeon: Malka So, MD;  Location: [facility];  Service: Urology;  Laterality: Right;   CYSTOSCOPY WITH LITHOLAPAXY Right 10/13/2013   Procedure: CYSTOSCOPY WITH LITHOLAPAXY, removal of urethral stone with basket;  Surgeon: Irine Seal, MD;  Location: [facility];  Service: Urology;  Laterality: Right;   CYSTOSCOPY WITH RETROGRADE PYELOGRAM, URETEROSCOPY AND STENT PLACEMENT Left  03/21/2014   Procedure: CYSTOSCOPY WITH RETROGRADE PYELOGRAM, AND LEFT STENT PLACEMENT;  Surgeon: Alexis Frock, MD;  Location: [facility];  Service: Urology;  Laterality: Left;   CYSTOSCOPY WITH RETROGRADE PYELOGRAM, URETEROSCOPY AND STENT PLACEMENT  09/09/2016   Procedure: CYSTOSCOPY WITH RIGHT  RETROGRADE PYELOGRAM, URETEROSCOPY WITH LASER BASKET EXTRACTION OF STONES  AND STENT PLACEMENT;  Surgeon: Irine Seal, MD;  Location: [facility];  Service: Urology;;   CYSTOSCOPY WITH URETEROSCOPY Left 04/11/2014   Procedure: CYSTOSCOPY WITH URETEROSCOPY/ STONE EXTRACTION;  Surgeon: Malka So, MD;  Location: [facility];  Service: Urology;  Laterality: Left;   FINE NEEDLE ASPIRATION  09/06/2019   Procedure: FINE NEEDLE ASPIRATION;  Surgeon: Collene Gobble, MD;  Location: [facility] ENDOSCOPY;  Service: Pulmonary;;  left upper lobe - lung   HOLMIUM LASER APPLICATION Right  12/02/2012   Procedure: HOLMIUM LASER APPLICATION;  Surgeon: Malka So, MD;  Location: [facility];  Service: Urology;  Laterality: Right;   HOLMIUM LASER APPLICATION Left 56/81/2751   Procedure: HOLMIUM LASER APPLICATION;  Surgeon: Malka So, MD;  Location: [facility];  Service: Urology;  Laterality: Left;   HOLMIUM LASER APPLICATION Right 7/00/1749   Procedure: HOLMIUM LASER APPLICATION;  Surgeon: Irine Seal, MD;  Location: [facility];  Service: Urology;  Laterality: Right;   INCISIONAL HERNIA REPAIR N/A 10/01/2021   Procedure: INCISIONAL HERNIA REPAIR WITH MESH;  Surgeon: Kinsinger, Arta Bruce, MD;  Location: [facility];  Service: General;  Laterality: N/A;   INGUINAL HERNIA REPAIR Right 1954   INTERCOSTAL NERVE BLOCK Left 10/28/2019   Procedure: Intercostal Nerve Block;  Surgeon: [name], MD;  Location: [place];  Service: Thoracic;  Laterality: Left;   LITHOTRIPSY  2007 & 02/2013   LUNG BIOPSY  09/06/2019   Procedure: LUNG BIOPSY;  Surgeon: Collene Gobble, MD;   Location: [facility] ENDOSCOPY;  Service: Pulmonary;;  left upper lob lung   MANDIBLE FRACTURE SURGERY  1995   MICROLARYNGOSCOPY Right 02/18/2016   Procedure: MICRO DIRECT LARYNGOSCOPY EXCISIONAL BIOPSY OF RIGHT VOCAL CORD MASS;  Surgeon: Leta Baptist, MD;  Location: [place];  Service: ENT;  Laterality: Right;   [place]   left ear   MULTIPLE TOOTH [place]   NEPHROLITHOTOMY Left 01/27/2013   Procedure: NEPHROLITHOTOMY PERCUTANEOUS FIRST LOOK;  Surgeon: Malka So, MD;  Location[facility];  Service: Urology;  Laterality: Left;   NODE DISSECTION  10/28/2019   Procedure: Node Dissection;  Surgeon: [name], MD;  Location: [place];  Service: Thoracic;;   PERIPHERAL VASCULAR CATHETERIZATION N/A 11/01/2014   Procedure: Lower Extremity Angiography;  Surgeon: Serafina Mitchell, MD;  Location: [place] [facility];  Service: Cardiovascular;  Laterality: N/A;   URETEROSCOPY Right 12/02/2012   Procedure: RIGHT URETEROSCOPY STONE EXTRACTION WITH  STENT PLACEMENT;  Surgeon: Malka So, MD;  Location: [facility];  Service: Urology;  Laterality: Right;   VIDEO BRONCHOSCOPY  10/28/2019   VIDEO BRONCHOSCOPY WITH ENDOBRONCHIAL NAVIGATION WITH LUNG MARKINGS (N/A   VIDEO BRONCHOSCOPY WITH ENDOBRONCHIAL NAVIGATION N/A 09/06/2019   Procedure: VIDEO BRONCHOSCOPY WITH ENDOBRONCHIAL NAVIGATION;  Surgeon: Collene Gobble, MD;  Location: [place] [facility]OSCOPY;  Service: Pulmonary;  Laterality: N/A;   VIDEO BRONCHOSCOPY WITH ENDOBRONCHIAL NAVIGATION N/A 10/28/2019   Procedure: VIDEO BRONCHOSCOPY WITH ENDOBRONCHIAL NAVIGATION WITH LUNG MARKINGS;  Surgeon: [name], MD;  Location: [place];  Service: Thoracic;  Laterality: N/A;   WEDGE RESECTION  10/28/2019   LEFT UPPER LOBE     Social History:   reports that he has been smoking cigarettes. He started smoking about 62 years ago. He has a 120.00 pack-year smoking history. He has never used smokeless tobacco. He reports that he does not  drink alcohol and does not use drugs.   Family History:  His family history includes Dementia in his father and mother; Hypertension in his mother and sister; Stroke (age of onset: 44) in his mother. There is no history of Colon cancer.   Allergies Allergies  Allergen Reactions   Morphine And Related Anxiety and Other (See Comments)    Combative Incoherent       Neurontin [Gabapentin] Other (See Comments)    "Slurred speech in high doses"   Tape Other (See Comments) and Rash  Surgical tape Adhesive tape causes blisters   Allevyn Adhesive [Wound Dressings] Other (See Comments)    blisters   Amoxil [Amoxicillin] Other (See Comments)    Severe stomach pain   Naprosyn [Naproxen] Swelling    Eyes swell     Home Medications  Prior to Admission medications   Medication Sig Start Date End Date Taking? Authorizing Provider  Amino Acids-Protein Hydrolys (FEEDING SUPPLEMENT, PRO-STAT SUGAR FREE 64,) LIQD Take 30 mLs by mouth in the morning.   Yes [provider]  Cholecalciferol (VITAMIN D-3) 1000 units CAPS Take 1,000 Units by mouth daily.    Yes [provider]  ezetimibe (ZETIA) 10 MG tablet Take 5 mg by mouth daily.   Yes [provider]  finasteride (PROSCAR) 5 MG tablet TAKE 1 TABLET BY MOUTH  DAILY Patient taking differently: Take 10 mg by mouth daily. 04/10/21  Yes Irine Seal, MD  gabapentin (NEURONTIN) 100 MG capsule Take 100-200 mg by mouth See admin instructions. 200 mg in the morning, 100 mg at bedtime. 03/26/20  Yes [provider]  Glucerna (GLUCERNA) LIQD Take 237 mLs by mouth 3 (three) times daily.   Yes [provider]  metFORMIN (GLUCOPHAGE) 500 MG tablet Take 1,000 mg by mouth in the morning and at bedtime.   Yes [provider]  polyethylene glycol powder (GLYCOLAX/MIRALAX) 17 GM/SCOOP powder Take 17 g by mouth every 12 (twelve) hours as needed (constipation).   Yes [provider]  rosuvastatin (CRESTOR) 40  MG tablet Take 40 mg by mouth daily.   Yes [provider]  Tiotropium Bromide-Olodaterol (STIOLTO RESPIMAT) 2.5-2.5 MCG/ACT AERS Inhale 2 puffs into the lungs daily. Patient taking differently: Inhale 1 puff into the lungs daily. 05/14/21  Yes Margaretha Seeds, MD  traMADol (ULTRAM) 50 MG tablet Take 1 tablet (50 mg total) by mouth every 6 (six) hours as needed (mild pain). Patient taking differently: Take 50 mg by mouth every 6 (six) hours as needed (pain). 01/20/20  Yes Swayze, Ava, DO  acetaminophen (TYLENOL) 500 MG tablet Take 2 tablets (1,000 mg total) by mouth every 6 (six) hours as needed for mild pain. Patient not taking: Reported on 09/25/2021 11/05/19   Barrett, Lodema Hong, PA-C  albuterol (VENTOLIN HFA) 108 (90 Base) MCG/ACT inhaler Inhale 2 puffs into the lungs every 6 (six) hours as needed for wheezing or shortness of breath. 05/06/21   Margaretha Seeds, MD  amLODipine (NORVASC) 10 MG tablet Take 10 mg by mouth daily. Patient not taking: Reported on 12/03/2021 11/15/20   [provider]  mupirocin ointment (BACTROBAN) 2 % Apply 1 application topically daily. With dressing changes. Patient not taking: Reported on 09/25/2021 07/04/21   Clayton Bibles, NP  nicotine (NICODERM CQ - DOSED IN MG/24 HOURS) 21 mg/24hr patch Place 1 patch (21 mg total) onto the skin daily. Patient not taking: Reported on 12/03/2021 10/08/21   Kinsinger, Arta Bruce, MD  olmesartan (BENICAR) 40 MG tablet Take 40 mg by mouth daily. Patient not taking: Reported on 12/03/2021 12/27/20   [provider]     Critical care time: 70 min.     Julian Hy, DO 12/03/21 3:39 PM [place] [facility]

## 2021-12-03 NOTE — ED Notes (Signed)
Help get patient on the monitor patient is resting with nurse at bedside

## 2021-12-03 NOTE — Progress Notes (Signed)
At bedside for USGPIV for vasopressors. Primary RN stated patient currently weaning off pressors, and does not need another PIV at this time. RN to consult IVT if unable to wean.

## 2021-12-03 NOTE — IPAL (Addendum)
  Interdisciplinary Goals of Care Family Meeting   Date carried out:: 12/03/2021  Location of the meeting: Phone conference  Member's involved: Physician and Family Member or next of kin  Durable Power of Attorney or acting medical decision maker: wife  - Stanton Kidney  Discussion: We discussed goals of care for [employer] .  Mrs. Krupinski is aware that he has been declining and has stage IV cancer that is continuing to progress. She admits that he has been confused some recently and she thinks he has been intermittently delusional. We discussed my concern that due to poor performance status, severe malnutrition, severe COPD, stage IV lung cancer with cachexia, he may not be able to survive this admission. If he progresses to the point of needing MV, I do not think he could be weaned back off the vent due to severe COPD and significant weakness. She understands at that point we may be prolonging suffering and agrees that he would not want to be intubated. We discussed code status and she agrees that DNR would be appropriate for him. She will be coming to visit him later today when she can get a ride and she is ok with Palliative Care being consulted on him this admission since they see him as an outpatient.  Code status: Full DNR, DNI  Disposition: Continue current acute care   Time spent for the meeting: 10 min.  Julian Hy 12/03/2021, 3:29 PM  I discussed with Mr. Arwood what I had discussed with his wife, that I do not think MV or resuscitation in a code would benefit him. He agreed. He will remain DNR and DNI.  Julian Hy, DO 12/03/21 3:58 PM [place] [facility]

## 2021-12-03 NOTE — ED Triage Notes (Addendum)
Pt bib [facility] from [facility] where he was found with shob and altered since yesterday. Pt found to be 70% on RA upon ems arrival. Pt arrives on NRB 15L

## 2021-12-03 NOTE — Progress Notes (Signed)
  Echocardiogram 2D Echocardiogram has been performed.  Darlina Sicilian M 12/03/2021, 3:19 PM

## 2021-12-03 NOTE — Progress Notes (Signed)
eLink Physician-Brief Progress Note Patient Name: Chad Gomez DOB: [birth year] MRN: [id number]   Date of Service  12/03/2021  HPI/Events of Note  History of tobacco abuse. Smokes 0.5 to 1 PPD. Patient is requesting a nicotine patch.  eICU Interventions  Plan: Nicotine patch 21 mg to skin now and Q day.      Intervention Category Major Interventions: Other:  Lysle Dingwall 12/03/2021, 7:48 PM

## 2021-12-03 NOTE — Sepsis Progress Note (Signed)
ELink tracking the Code Sepsis. 

## 2021-12-03 NOTE — Progress Notes (Addendum)
eLink Physician-Brief Progress Note Patient Name: Chad Gomez DOB: 10/21/1945 MRN: [id number]   Date of Service  12/03/2021  HPI/Events of Note  Lactic Acid = 5.5 --> 3.8. Lactic acid appears to be clearing slowly. Hgb = 13.3.  eICU Interventions  Plan: Repeat Lactic Acid at 5 AM.     Intervention Category Major Interventions: Acid-Base disturbance - evaluation and management  Lysle Dingwall 12/03/2021, 9:54 PM

## 2021-12-03 NOTE — Progress Notes (Signed)
RT assisted with transport of this pt from ED02 to CT and back while on BiPAP. Pt tolerated well with SVS.

## 2021-12-04 DIAGNOSIS — R54 Age-related physical debility: Secondary | ICD-10-CM

## 2021-12-04 DIAGNOSIS — Z515 Encounter for palliative care: Secondary | ICD-10-CM

## 2021-12-04 DIAGNOSIS — C3412 Malignant neoplasm of upper lobe, left bronchus or lung: Secondary | ICD-10-CM | POA: Diagnosis not present

## 2021-12-04 DIAGNOSIS — R64 Cachexia: Secondary | ICD-10-CM

## 2021-12-04 DIAGNOSIS — R0902 Hypoxemia: Secondary | ICD-10-CM | POA: Diagnosis not present

## 2021-12-04 LAB — RESPIRATORY PANEL BY PCR

## 2021-12-04 LAB — CBC
HCT: 34.9 % — ABNORMAL LOW (ref 39.0–52.0)
Hemoglobin: 11.4 g/dL — ABNORMAL LOW (ref 13.0–17.0)
MCH: 29.8 pg (ref 26.0–34.0)
MCHC: 32.7 g/dL (ref 30.0–36.0)
MCV: 91.4 fL (ref 80.0–100.0)
Platelets: 254 10*3/uL (ref 150–400)
RBC: 3.82 MIL/uL — ABNORMAL LOW (ref 4.22–5.81)
RDW: 14.8 % (ref 11.5–15.5)
WBC: 9.5 10*3/uL (ref 4.0–10.5)
nRBC: 0 % (ref 0.0–0.2)

## 2021-12-04 LAB — BASIC METABOLIC PANEL
Anion gap: 8 (ref 5–15)
BUN: 41 mg/dL — ABNORMAL HIGH (ref 8–23)
CO2: 24 mmol/L (ref 22–32)
Calcium: 9.3 mg/dL (ref 8.9–10.3)
Chloride: 109 mmol/L (ref 98–111)
Creatinine, Ser: 1.26 mg/dL — ABNORMAL HIGH (ref 0.61–1.24)
GFR, Estimated: 60 mL/min — ABNORMAL LOW (ref >60)
Glucose, Bld: 88 mg/dL (ref 70–99)
Potassium: 4.6 mmol/L (ref 3.5–5.1)
Sodium: 141 mmol/L (ref 135–145)

## 2021-12-04 LAB — GLUCOSE, CAPILLARY
Glucose-Capillary: 100 mg/dL — ABNORMAL HIGH (ref 70–99)
Glucose-Capillary: 70 mg/dL (ref 70–99)
Glucose-Capillary: 130 mg/dL — ABNORMAL HIGH (ref 70–99)
Glucose-Capillary: 174 mg/dL — ABNORMAL HIGH (ref 70–99)
Glucose-Capillary: 158 mg/dL — ABNORMAL HIGH (ref 70–99)
Glucose-Capillary: 101 mg/dL — ABNORMAL HIGH (ref 70–99)

## 2021-12-04 LAB — URINE CULTURE

## 2021-12-04 LAB — PHOSPHORUS: Phosphorus: 2.4 mg/dL — ABNORMAL LOW (ref 2.5–4.6)

## 2021-12-04 LAB — MAGNESIUM: Magnesium: 1.4 mg/dL — ABNORMAL LOW (ref 1.7–2.4)

## 2021-12-04 LAB — LACTIC ACID, PLASMA: Lactic Acid, Venous: 3 mmol/L (ref 0.5–1.9)

## 2021-12-04 MED ORDER — INSULIN ASPART 100 UNIT/ML IJ SOLN
0.0000 [IU] | Freq: Three times a day (TID) | INTRAMUSCULAR | Status: DC
  Administered 2021-12-04: 2 [IU] via SUBCUTANEOUS
  Administered 2021-12-07 – 2021-12-09 (×4): 1 [IU] via SUBCUTANEOUS

## 2021-12-04 MED ORDER — FINASTERIDE 5 MG PO TABS: 10.0000 mg | ORAL_TABLET | Freq: Every day | ORAL | Status: DC

## 2021-12-04 MED ORDER — ORAL CARE MOUTH RINSE: 15.0000 mL | OROMUCOSAL | Status: DC | PRN

## 2021-12-04 MED ORDER — SODIUM CHLORIDE 3 % IN NEBU
4.0000 mL | INHALATION_SOLUTION | Freq: Two times a day (BID) | RESPIRATORY_TRACT | Status: AC
Start: 2021-12-04 — End: 2021-12-06
  Administered 2021-12-04 – 2021-12-06 (×6): 4 mL via RESPIRATORY_TRACT
  Filled 2021-12-04 (×7): qty 4

## 2021-12-04 MED ORDER — FINASTERIDE 5 MG PO TABS
5.0000 mg | ORAL_TABLET | Freq: Every day | ORAL | Status: DC
  Administered 2021-12-07 – 2021-12-10 (×3): 5 mg via ORAL
  Filled 2021-12-04 (×5): qty 1

## 2021-12-04 MED ORDER — INSULIN ASPART 100 UNIT/ML IJ SOLN: 0.0000 [IU] | Freq: Every day | INTRAMUSCULAR | Status: DC

## 2021-12-04 NOTE — Progress Notes (Signed)
   NAME:  ARCH METHOT, MRN:  [id number], DOB:  [birth year], LOS: 1 ADMISSION DATE:  12/03/2021, CONSULTATION DATE:  6/13 REFERRING MD:  Kom[place], CHIEF COMPLAINT:  Dyspnea   History of Present Illness:  76 y/o male with stage IV lung cancer followed by palliative care admitted with acute hypoxemic respiratory failure due to CAP and a COPD exacerbation complicated by septic shock.   Pertinent  Medical History  Stage IV NSCLC with mets to pleura & MPE; h/o previous LUL resection in 2021 COPD cachexia DM2 Melanoma Chronic indwelling foley PAD  Significant Hospital Events: Including procedures, antibiotic start and stop dates in addition to other pertinent events   6/13 admission to ICU, started on cefepime, vanc, azithromycin, pressors 6/14 off NIMV, stop vanc  Interim History / Subjective:   Feels a little better CBG low    Objective   Blood pressure 99/64, pulse 77, temperature (!) 97.2 F (36.2 C), temperature source Axillary, resp. rate (!) 21, height 5\' 10"  (1.778 m), weight 57.5 kg, SpO2 100 %.    FiO2 (%):  [40 %-60 %] 40 %   Intake/Output Summary (Last 24 hours) at 12/04/2021 0721 Last data filed at 12/04/2021 0600 Gross per 24 hour  Intake 3890.85 ml  Output 925 ml  Net 2965.85 ml   Filed Weights   12/03/21 1700 12/04/21 0500  Weight: 64 kg 57.5 kg    Examination:  General:  Chronically ill appearing, cachectic, resting comfortably in bed HENT: NCAT OP clear PULM: Rhonchi in bases B, normal effort CV: RRR, no mgr GI: BS+, soft, nontender MSK: diminished bulk and tone Neuro: awake, alert, no distress, MAEW   Resolved Hospital Problem list     Assessment & Plan:  Acute respiratory failure with hypoxemia and hyeprapnea CAP Severe COPD with acute exacerbation Stage IV lung cancer s/p LUL Lobectomy Prior malignant pleural effusion on left, previous pleurX now removed Continue cefepime, azithromycin Stop vanc Continue solumedrol Continue  brovana/yupelri Add hypertonic saline and chest PT for pulmonary toilette  Septic shock > resolved Monitor hemodynamics  Cachexia with severe protein calorie malnutrition He is at high risk for aspiration, however given his advanced disease without a cure I spoke to him about risks/benefits.  He would rather eat than remain NPO and understands he is at risk for aspiration. Advance diet as able  Hypoglycemia overnight 6/14 Advance diet  AKI > improving Monitor BMET and UOP Replace electrolytes as needed  BPH with chronic foley Continue foley Add back proscar   Best Practice (right click and "Reselect all SmartList Selections" daily)   Diet/type: Regular consistency (see orders) DVT prophylaxis: prophylactic heparin  GI prophylaxis: N/A Lines: N/A Foley:  Yes, and it is still needed Code Status:  DNR Last date of multidisciplinary goals of care discussion [6/13 Dr. Carlis Abbott with patient and wife]   Critical care time: n/a    Roselie Awkward, MD [place] [facility] Pager: [phone] Cell: [phone] After 7:00 pm call Elink  [phone]

## 2021-12-04 NOTE — Consult Note (Cosign Needed Addendum)
Consultation Note Date: 12/04/2021   Patient Name: Chad Gomez  DOB: 10/03/1945  MRN: [id number]  Age / Sex: 76 y.o., male  PCP: [name], MD Referring Physician: Juanito Doom, MD  Reason for Consultation: Cachexia, severe COPD, stage IV lung cancer  HPI/Patient Profile: 76 y.o. male  with past medical history of stage IV lung cancer with malignant pleural effusions status post lobectomy-not on any chemotherapy-last seen by Dr. Earlie Server in February and was offered chemo versus hospice and he requested time to consider his decision at that time-I do not see any further follow-up, COPD, BPH, DM 2, from [place] facility where he has been since he was discharged from the hospital in April after an elective hernia repair- admitted on 12/03/2021 with shortness of breath, hypoxia, tachycardia, hypotension.  CT scan this admission shows concern for lymphangitic spread of his cancer continue to thickened areas of the pleura concerning for enlarging pleural mets and severe emphysema.  Concerns for sepsis related to CAP, COPD exacerbation. He is on high flow oxygen.  Palliative medicine consulted for above  Primary Decision Maker NEXT OF KIN -spouse Chad Gomez  Discussion: I have reviewed the chart including labs progress notes and imaging from this admission and previous admissions. Evaluated the patient-he appears very cachectic and frail.  It takes a lot of effort for him to speak.  When I asked him questions that he would only respond with "t, J, C, P"-he is confused. Chart review shows that he has had significant weight loss-in April he was 65.8 kg-this admission he is down to 57.5 kg. I called his spouse Chad Gomez.  Introduced palliative medicine. He was seen by Dr. Rowe Pavy of palliative medicine in [place] that time patient elected DNR CODE STATUS.  At that time decisions were made for patient to  discharge to skilled facility in the hopes of following up with Dr. Earlie Server one more time before making final decisions about chemotherapy. Noted patient has been seen outpatient by palliative medicine via telehealth visit on June 6 of this year-goals at that time were to maximize quality of life and symptom management. Chad Gomez is planning on coming to the hospital tomorrow with patient's daughter.  We discussed the meet need to meet and reviewed patient's goals of care and plan of care moving forward for him.    SUMMARY OF RECOMMENDATIONS -Patient has DNR in place -Continue current care-he is being treated for pneumonia and COPD exacerbation -Plan made to meet with his spouse and daughter tomorrow at 2 PM for further goals of care and advanced care planning discussion  Code Status/Advance Care Planning: DNR   Prognosis:   Unable to determine  Discharge Planning: To Be Determined  Primary Diagnoses: Present on Admission:  Hypoxia   Review of Systems  Physical Exam  Vital Signs: BP 117/74   Pulse (!) 104   Temp 98.1 F (36.7 C) (Axillary)   Resp (!) 22   Ht 5\' 10"  (1.778 m)   Wt 57.5 kg   SpO2 98%  BMI 18.19 kg/m  Pain Scale: 0-10   Pain Score: 0-No pain   SpO2: SpO2: 98 % O2 Device:SpO2: 98 % O2 Flow Rate: .O2 Flow Rate (L/min): 20 L/min  IO: Intake/output summary:  Intake/Output Summary (Last 24 hours) at 12/04/2021 1555 Last data filed at 12/04/2021 1200 Gross per 24 hour  Intake 4811.59 ml  Output 1025 ml  Net 3786.59 ml    LBM: Last BM Date :  (PTA) Baseline Weight: Weight: 64 kg Most recent weight: Weight: 57.5 kg       Thank you for this consult. Palliative medicine will continue to follow and assist as needed.  Time Total: 70 minutes  Greater than 50%  of this time was spent counseling and coordinating care related to the above assessment and plan.  Signed by: Mariana Kaufman, AGNP-C Palliative Medicine    Please contact Palliative Medicine Team  phone at [phone] for questions and concerns.  For individual provider: See Shea Evans

## 2021-12-04 NOTE — Progress Notes (Signed)
[facility]  Remains off BIPAP Titrating down HHFNC oxygen Oxygenation stable  Overall prognosis poor.  Patient and wife agree that if his respiratory status worsens we will move to full comfort measures.  However for now will continue to treat pneumonia and COPD exacerbation with hope he can make it home.  Will transfer to med-surg, [facility] service Palliative to meet with wife tomorrow.  Roselie Awkward, MD [place] Pager: [phone] Cell: [phone] After 7:00 pm call Elink  [phone]

## 2021-12-05 DIAGNOSIS — I10 Essential (primary) hypertension: Secondary | ICD-10-CM | POA: Diagnosis not present

## 2021-12-05 DIAGNOSIS — J440 Chronic obstructive pulmonary disease with acute lower respiratory infection: Secondary | ICD-10-CM | POA: Diagnosis not present

## 2021-12-05 DIAGNOSIS — E785 Hyperlipidemia, unspecified: Secondary | ICD-10-CM

## 2021-12-05 DIAGNOSIS — J189 Pneumonia, unspecified organism: Secondary | ICD-10-CM | POA: Diagnosis not present

## 2021-12-05 DIAGNOSIS — J209 Acute bronchitis, unspecified: Secondary | ICD-10-CM | POA: Diagnosis not present

## 2021-12-05 DIAGNOSIS — Z7189 Other specified counseling: Secondary | ICD-10-CM | POA: Diagnosis not present

## 2021-12-05 DIAGNOSIS — C3412 Malignant neoplasm of upper lobe, left bronchus or lung: Secondary | ICD-10-CM | POA: Diagnosis not present

## 2021-12-05 DIAGNOSIS — R0902 Hypoxemia: Secondary | ICD-10-CM | POA: Diagnosis not present

## 2021-12-05 LAB — GLUCOSE, CAPILLARY
Glucose-Capillary: 84 mg/dL (ref 70–99)
Glucose-Capillary: 90 mg/dL (ref 70–99)
Glucose-Capillary: 101 mg/dL — ABNORMAL HIGH (ref 70–99)
Glucose-Capillary: 115 mg/dL — ABNORMAL HIGH (ref 70–99)

## 2021-12-05 LAB — BASIC METABOLIC PANEL
Anion gap: 13 (ref 5–15)
BUN: 35 mg/dL — ABNORMAL HIGH (ref 8–23)
CO2: 21 mmol/L — ABNORMAL LOW (ref 22–32)
Calcium: 9.4 mg/dL (ref 8.9–10.3)
Chloride: 104 mmol/L (ref 98–111)
Creatinine, Ser: 1.16 mg/dL (ref 0.61–1.24)
GFR, Estimated: >60 mL/min (ref >60)
Glucose, Bld: 103 mg/dL — ABNORMAL HIGH (ref 70–99)
Potassium: 4.3 mmol/L (ref 3.5–5.1)
Sodium: 138 mmol/L (ref 135–145)

## 2021-12-05 NOTE — Progress Notes (Signed)
Page - BLOOD CULTURE   Chad Gomez is an 76 y.o. male who presented to [name] on 12/03/2021 with a chief complaint of SOB/hypotension  Assessment:  75 YOM on treatment for PNA now with blood cultures showing 1 bottle out of 4 growing GPR  Name of physician (or Provider) Contacted: Ghimire  Current antibiotics: Cefepime + Azithro  Changes to prescribed antibiotics recommended:  None - likely representative of contamination  No results found for this or any previous visit.  Thank you for allowing pharmacy to be a part of this patient's care.  Alycia Rossetti, PharmD, BCPS Infectious Diseases Clinical Pharmacist 12/05/2021 5:02 PM   **Pharmacist phone directory can now be found on [email or web address] ([email or web address]).  Listed under [place].

## 2021-12-05 NOTE — Progress Notes (Addendum)
PROGRESS NOTE        PATIENT DETAILS Name: Chad Gomez Age: 76 y.o. Sex: male Date of Birth: 1946/03/10 Admit Date: 12/03/2021 Admitting Physician Julian Hy, DO FBP:ZWCHE, Thayer Jew, MD  Brief Summary: Patient is a 76 y.o.  male COPD, metastatic non-small cell carcinoma-s/p LUL lobectomy 2021-intolerant to chemo-currently on observation-presented to the hospital from SNF with shortness of breath-found to have acute hypoxic and hypercapnic respiratory failure due to PNA.  Admitted by PCCM to the ICU-required BiPAP-upon improvement transferred to [facility].   Significant events: 6/13>> to ED from SNF-SOB-hypoxic/hypercarbic-PNA-COPD exacerbation-started BiPAP-transfer to ICU. 6/15>> now DNR-transfer to [facility].  Significant studies: 6/13>> CTA chest: No PE-significant increased opacity in the left lung base-likely worsening PNA.  Aspirated/mucous plugging in the right lower lobe bronchi. 6/13>> Echo: EF 65-70%, RVSP 30.5  Significant microbiology data: 6/13>> COVID PCR: Negative 6/13>> urine culture: Multiple species 6/13>> blood culture: No growth 6/13>> respiratory virus panel: Rhinovirus/enterovirus  Procedures: None  Consults: PCCM Palliative care  Subjective: Very frail/cachectic appearing-on around 2-3 L of oxygen.  No family at bedside.  Objective: Vitals: Blood pressure 119/75, pulse 83, temperature (!) 97.5 F (36.4 C), temperature source Oral, resp. rate (!) 22, height 5\' 10"  (1.778 m), weight 57.7 kg, SpO2 99 %.   Exam: Gen Exam:Alert awake-not in any distress HEENT:atraumatic, normocephalic Chest: B/L clear to auscultation anteriorly CVS:S1S2 regular Abdomen:soft non tender, non distended Extremities:no edema Neurology: Non focal Skin: no rash  Pertinent Labs/Radiology:    Latest Ref Rng & Units 12/04/2021    5:49 AM 12/03/2021    1:44 PM 12/03/2021    1:16 PM  CBC  WBC 4.0 - 10.5 K/uL 9.5   12.0   Hemoglobin 13.0 - 17.0 g/dL  11.4  13.3  13.1   Hematocrit 39.0 - 52.0 % 34.9  39.0  42.0   Platelets 150 - 400 K/uL 254   326     Lab Results  Component Value Date   NA 138 12/05/2021   K 4.3 12/05/2021   CL 104 12/05/2021   CO2 21 (L) 12/05/2021      Assessment/Plan: Acute hypoxic and hypercarbic respiratory failure due to aspiration PNA +/- Rhino virus PNA and COPD exacerbation: Stable on 2-3 L of O2-is awake and alert-continue cefepime/Zithromax/steroids and bronchodilators.  Very poor prognosis-very frail/cachectic appearing-we will benefit by transitioning to comfort measures-palliative care to meet with family later today.  Septic shock: Due to PNA-resolved.  On antibiotics-culture data as above  Dysphagia in the setting of severe frailty/cachexia: Reviewed prior PCCM note-on dysphagia 3 diet with known aspiration risk.  Await goals of care discussion with family-if not transition to comfort measures-May benefit from SLP evaluation to ensure he is on a appropriate diet.    AKI: Likely hemodynamically mediated in the setting of sepsis/PNA-resolved with supportive care.  Hypoglycemia: Due to poor oral intake-continue supportive care-follow CBGs-encourage oral intake.  If hypoglycemia recurs in spite of oral intake-we can consider further work-up but given overall poor prognosis-doubt this will change management.  BPH with chronic indwelling Foley catheter: Continue Proscar-Foley remains in place.  History of stage IV non-small cell cancer of the lung-s/p left upper lobe lobectomy 10/28/2019: He was tried on chemotherapy by Dr. Fernand Parkins was discontinued after 2 cycles due to intolerance.  Sequently developed a malignant pleural effusion-requiring a Pleurx catheter that was since removed.  He currently appears to have advanced disease with severe failure to thrive syndrome/cachexia/severe protein calorie malnutrition-and is best served by transitioning to full comfort measures.  Await palliative care  recommendations-family meeting scheduled for later today.  HTN: BP stable-all antihypertensives on hold  HLD: Statin on hold  Pressure Ulcer: Pressure Injury 12/03/21 Sacrum Upper Deep Tissue Pressure Injury - Purple or maroon localized area of discolored intact skin or blood-filled blister due to damage of underlying soft tissue from pressure and/or shear. (Active)  12/03/21 1600  Location: Sacrum  Location Orientation: Upper  Staging: Deep Tissue Pressure Injury - Purple or maroon localized area of discolored intact skin or blood-filled blister due to damage of underlying soft tissue from pressure and/or shear.  Wound Description (Comments):   Present on Admission: Yes  Dressing Type Foam - Lift dressing to assess site every shift 12/05/21 0735    Severe protein calorie malnutrition/cachexia: Related to underlying advanced malignancy   Underweight: Estimated body mass index is 18.25 kg/m as calculated from the following:   Height as of this encounter: 5\' 10"  (1.778 m).   Weight as of this encounter: 57.7 kg.   Code status:   Code Status: DNR   DVT Prophylaxis: heparin injection 5,000 Units Start: 12/03/21 1430 SCDs Start: 12/03/21 1412   Family Communication: None at bedside-palliative care meeting with family later today.   Disposition Plan: Status is: Inpatient Remains inpatient appropriate because: Aspiration PNA-COPD-not yet stable for discharge-palliative care meeting with family for further ongoing goals of care discussion.   Planned Discharge Destination:Skilled nursing facility versus hospice   Diet: Diet Order             DIET DYS 3 Room service appropriate? Yes; Fluid consistency: Thin  Diet effective now                     Antimicrobial agents: Anti-infectives (From admission, onward)    Start     Dose/Rate Route Frequency Ordered Stop   12/03/21 1729  vancomycin variable dose per unstable renal function (pharmacist dosing)  Status:   Discontinued         Does not apply See admin instructions 12/03/21 1729 12/04/21 0907   12/03/21 1415  ceFEPIme (MAXIPIME) 2 g in sodium chloride 0.9 % 100 mL IVPB        2 g 200 mL/hr over 30 Minutes Intravenous Every 12 hours 12/03/21 1407     12/03/21 1415  vancomycin (VANCOREADY) IVPB 1500 mg/300 mL        1,500 mg 150 mL/hr over 120 Minutes Intravenous  Once 12/03/21 1407 12/03/21 2042   12/03/21 1415  azithromycin (ZITHROMAX) 500 mg in sodium chloride 0.9 % 250 mL IVPB  Status:  Discontinued        500 mg 250 mL/hr over 60 Minutes Intravenous Every 24 hours 12/03/21 1409 12/03/21 1412   12/03/21 1415  azithromycin (ZITHROMAX) 500 mg in sodium chloride 0.9 % 250 mL IVPB        500 mg 250 mL/hr over 60 Minutes Intravenous Every 24 hours 12/03/21 1412 12/08/21 1414        MEDICATIONS: Scheduled Meds:  arformoterol  15 mcg Nebulization BID   chlorhexidine  15 mL Mouth Rinse BID   Chlorhexidine Gluconate Cloth  6 each Topical Q0600   finasteride  5 mg Oral Daily   heparin  5,000 Units Subcutaneous Q8H   insulin aspart  0-5 Units Subcutaneous QHS   insulin aspart  0-9 Units Subcutaneous TID  WC   mouth rinse  15 mL Mouth Rinse q12n4p   methylPREDNISolone (SOLU-MEDROL) injection  40 mg Intravenous Daily   nicotine  21 mg Transdermal Daily   revefenacin  175 mcg Nebulization Daily   sodium chloride HYPERTONIC  4 mL Nebulization BID   Continuous Infusions:  sodium chloride     azithromycin Stopped (12/04/21 1533)   ceFEPime (MAXIPIME) IV 2 g (12/05/21 0946)   PRN Meds:.docusate sodium, ipratropium-albuterol, mouth rinse, polyethylene glycol   I have personally reviewed following labs and imaging studies  LABORATORY DATA: CBC: Recent Labs  Lab 12/03/21 1316 12/03/21 1344 12/04/21 0549  WBC 12.0*  --  9.5  NEUTROABS 9.4*  --   --   HGB 13.1 13.3 11.4*  HCT 42.0 39.0 34.9*  MCV 95.7  --  91.4  PLT 326  --  324    Basic Metabolic Panel: Recent Labs  Lab  12/03/21 1316 12/03/21 1344 12/04/21 0736 12/05/21 0053  NA 140 137 141 138  K 5.1 4.9 4.6 4.3  CL 104  --  109 104  CO2 21*  --  24 21*  GLUCOSE 239*  --  88 103*  BUN 49*  --  41* 35*  CREATININE 1.61*  --  1.26* 1.16  CALCIUM 10.2  --  9.3 9.4  MG  --   --  1.4*  --   PHOS  --   --  2.4*  --     GFR: Estimated Creatinine Clearance: 45.6 mL/min (by C-G formula based on SCr of 1.16 mg/dL).  Liver Function Tests: Recent Labs  Lab 12/03/21 1316  AST 25  ALT 12  ALKPHOS 111  BILITOT 1.0  PROT 6.1*  ALBUMIN 2.8*   No results for input(s): "LIPASE", "AMYLASE" in the last 168 hours. No results for input(s): "AMMONIA" in the last 168 hours.  Coagulation Profile: Recent Labs  Lab 12/03/21 1316  INR 1.2    Cardiac Enzymes: No results for input(s): "CKTOTAL", "CKMB", "CKMBINDEX", "TROPONINI" in the last 168 hours.  BNP (last 3 results) No results for input(s): "PROBNP" in the last 8760 hours.  Lipid Profile: No results for input(s): "CHOL", "HDL", "LDLCALC", "TRIG", "CHOLHDL", "LDLDIRECT" in the last 72 hours.  Thyroid Function Tests: No results for input(s): "TSH", "T4TOTAL", "FREET4", "T3FREE", "THYROIDAB" in the last 72 hours.  Anemia Panel: No results for input(s): "VITAMINB12", "FOLATE", "FERRITIN", "TIBC", "IRON", "RETICCTPCT" in the last 72 hours.  Urine analysis:    Component Value Date/Time   COLORURINE YELLOW 12/03/2021 1847   APPEARANCEUR HAZY (A) 12/03/2021 1847   LABSPEC 1.041 (H) 12/03/2021 1847   PHURINE 5.0 12/03/2021 1847   GLUCOSEU NEGATIVE 12/03/2021 1847   HGBUR MODERATE (A) 12/03/2021 1847   BILIRUBINUR NEGATIVE 12/03/2021 1847   KETONESUR NEGATIVE 12/03/2021 1847   PROTEINUR NEGATIVE 12/03/2021 1847   UROBILINOGEN 1.0 01/19/2015 1312   NITRITE NEGATIVE 12/03/2021 1847   LEUKOCYTESUR MODERATE (A) 12/03/2021 1847    Sepsis Labs: Lactic Acid, Venous    Component Value Date/Time   LATICACIDVEN 3.0 ([place]) 12/04/2021 0736     MICROBIOLOGY: Recent Results (from the past 240 hour(s))  Blood Culture (routine x 2)     Status: None (Preliminary result)   Collection Time: 12/03/21  2:10 PM   Specimen: BLOOD  Result Value Ref Range Status   Specimen Description BLOOD BLOOD RIGHT HAND  Final   Special Requests   Final    BOTTLES DRAWN AEROBIC AND ANAEROBIC Blood Culture results may not be optimal due  to an inadequate volume of blood received in culture bottles   Culture   Final    NO GROWTH 2 DAYS Performed at [facility], [place] [address], [place], [place] [address]    Report Status PENDING  Incomplete  Urine Culture     Status: Abnormal   Collection Time: 12/03/21  2:10 PM   Specimen: In/Out Cath Urine  Result Value Ref Range Status   Specimen Description IN/OUT CATH URINE  Final   Special Requests   Final    NONE Performed at [facility], [place] [address], [place], [place] [address]    Culture MULTIPLE SPECIES PRESENT, SUGGEST RECOLLECTION (A)  Final   Report Status 12/04/2021 FINAL  Final  Blood Culture (routine x 2)     Status: None (Preliminary result)   Collection Time: 12/03/21  4:05 PM   Specimen: BLOOD  Result Value Ref Range Status   Specimen Description BLOOD BLOOD LEFT HAND  Final   Special Requests   Final    BOTTLES DRAWN AEROBIC AND ANAEROBIC Blood Culture results may not be optimal due to an inadequate volume of blood received in culture bottles   Culture   Final    NO GROWTH 2 DAYS Performed at [facility], [place] [address], [place], [place] [address]    Report Status PENDING  Incomplete  MRSA Next Gen by PCR, Nasal     Status: None   Collection Time: 12/03/21  4:49 PM   Specimen: Nasal Mucosa; Nasal Swab  Result Value Ref Range Status   MRSA by PCR Next Gen NOT DETECTED NOT DETECTED Final    Comment: (NOTE) The GeneXpert MRSA Assay (FDA approved for NASAL specimens only), is one component of a comprehensive MRSA colonization surveillance program. It is not  intended to diagnose MRSA infection nor to guide or monitor treatment for MRSA infections. Test performance is not FDA approved in patients less than 13 years old. Performed at [facility], [place] [address] [place], [place] [address]   Respiratory (~20 pathogens) panel by PCR     Status: Abnormal   Collection Time: 12/03/21  4:58 PM   Specimen: Nasopharyngeal Swab; Respiratory  Result Value Ref Range Status   Adenovirus NOT DETECTED NOT DETECTED Final   Coronavirus 229E NOT DETECTED NOT DETECTED Final    Comment: (NOTE) The Coronavirus on the Respiratory Panel, DOES NOT test for the novel  Coronavirus (2019 nCoV)    Coronavirus HKU1 NOT DETECTED NOT DETECTED Final   Coronavirus NL63 NOT DETECTED NOT DETECTED Final   Coronavirus OC43 NOT DETECTED NOT DETECTED Final   Metapneumovirus NOT DETECTED NOT DETECTED Final   Rhinovirus / Enterovirus DETECTED (A) NOT DETECTED Final   Influenza A NOT DETECTED NOT DETECTED Final   Influenza B NOT DETECTED NOT DETECTED Final   Parainfluenza Virus 1 NOT DETECTED NOT DETECTED Final   Parainfluenza Virus 2 NOT DETECTED NOT DETECTED Final   Parainfluenza Virus 3 NOT DETECTED NOT DETECTED Final   Parainfluenza Virus 4 NOT DETECTED NOT DETECTED Final   Respiratory Syncytial Virus NOT DETECTED NOT DETECTED Final   Bordetella pertussis NOT DETECTED NOT DETECTED Final   Bordetella Parapertussis NOT DETECTED NOT DETECTED Final   Chlamydophila pneumoniae NOT DETECTED NOT DETECTED Final   Mycoplasma pneumoniae NOT DETECTED NOT DETECTED Final    Comment: Performed at [facility], [place], [place], [place] [address]  SARS Coronavirus 2 by RT PCR (hospital order, performed in  [facility] hospital lab) *cepheid single result test*     Status: None   Collection Time: 12/03/21  4:58 PM  Result Value Ref Range Status   SARS Coronavirus 2 by RT PCR NEGATIVE NEGATIVE Final    Comment: (NOTE) SARS-CoV-2 target nucleic acids are NOT  DETECTED.  The SARS-CoV-2 RNA is generally detectable in upper and lower respiratory specimens during the acute phase of infection. The lowest concentration of SARS-CoV-2 viral copies this assay can detect is 250 copies / mL. A negative result does not preclude SARS-CoV-2 infection and should not be used as the sole basis for treatment or other patient management decisions.  A negative result may occur with improper specimen collection / handling, submission of specimen other than nasopharyngeal swab, presence of viral mutation(s) within the areas targeted by this assay, and inadequate number of viral copies (<250 copies / mL). A negative result must be combined with clinical observations, patient history, and epidemiological information.  Fact Sheet for Patients:   https://www.patel.info/  Fact Sheet for Healthcare Providers: https://hall.com/  This test is not yet approved or  cleared by the [place] FDA and has been authorized for detection and/or diagnosis of SARS-CoV-2 by FDA under an Emergency Use Authorization (EUA).  This EUA will remain in effect (meaning this test can be used) for the duration of the COVID-19 declaration under Section 564(b)(1) of the Act, 21 U.S.C. section 360bbb-3(b)(1), unless the authorization is terminated or revoked sooner.  Performed at [facility], [place] [address], [place],  [address]     RADIOLOGY STUDIES/RESULTS: ECHOCARDIOGRAM COMPLETE  Result Date: 12/03/2021    ECHOCARDIOGRAM REPORT   Patient Name:   JERRIAN MELLS Date of Exam: 12/03/2021 Medical Rec #:  [id number]        Height:       70.0 in Accession #:    [id number]       Weight:       145.1 lb Date of Birth:  05/27/1946        BSA:          1.821 m Patient Age:    64 years         BP:           119/85 mmHg Patient Gender: M                HR:           109 bpm. Exam Location:  Inpatient Procedure: 2D Echo, Cardiac Doppler and  Color Doppler STAT ECHO Indications:    Dyspnea R06.00  History:        Patient has no prior history of Echocardiogram examinations. PAD                 and COPD; Risk Factors:Hypertension, Dyslipidemia and Diabetes.                 Chronic kidney disease.  Sonographer:    Darlina Sicilian RDCS Referring Phys: [place]  1. Left ventricular ejection fraction, by estimation, is 65 to 70%. The left ventricle has normal function. The left ventricle has no regional wall motion abnormalities. There is mild left ventricular hypertrophy. Left ventricular diastolic parameters are indeterminate.  2. Right ventricular systolic function is normal. The right ventricular size is normal. There is normal pulmonary artery systolic pressure. The estimated right ventricular systolic pressure is 93.7 mmHg.  3. The mitral valve is normal in structure. No evidence of mitral valve regurgitation.  4. The aortic valve is tricuspid. Aortic valve regurgitation is not visualized. Aortic valve sclerosis/calcification is present, without any evidence of aortic stenosis.  5. Aortic dilatation noted. There is mild dilatation of the ascending aorta, measuring 38 mm. FINDINGS  Left Ventricle: Left ventricular ejection fraction, by estimation, is 65 to 70%. The left ventricle has normal function. The left ventricle has no regional wall motion abnormalities. The left ventricular internal cavity size was small. There is mild left ventricular hypertrophy. Left ventricular diastolic parameters are indeterminate. Right Ventricle: The right ventricular size is normal. No increase in right ventricular wall thickness. Right ventricular systolic function is normal. There is normal pulmonary artery systolic pressure. The tricuspid regurgitant velocity is 2.37 m/s, and  with an assumed right atrial pressure of 8 mmHg, the estimated right ventricular systolic pressure is 54.6 mmHg. Left Atrium: Left atrial size was normal in size. Right  Atrium: Right atrial size was normal in size. Pericardium: There is no evidence of pericardial effusion. Mitral Valve: The mitral valve is normal in structure. No evidence of mitral valve regurgitation. Tricuspid Valve: The tricuspid valve is normal in structure. Tricuspid valve regurgitation is mild. Aortic Valve: The aortic valve is tricuspid. Aortic valve regurgitation is not visualized. Aortic valve sclerosis/calcification is present, without any evidence of aortic stenosis. Pulmonic Valve: The pulmonic valve was not well visualized. Pulmonic valve regurgitation is not visualized. Aorta: The aortic root is normal in size and structure and aortic dilatation noted. There is mild dilatation of the ascending aorta, measuring 38 mm. IAS/Shunts: The interatrial septum was not well visualized.  LEFT VENTRICLE PLAX 2D LVIDd:         2.80 cm LVIDs:         2.00 cm LV PW:         0.90 cm LV IVS:        1.30 cm LVOT diam:     2.00 cm LV SV:         29 LV SV Index:   16 LVOT Area:     3.14 cm  RIGHT VENTRICLE RV S prime:     19.50 cm/s TAPSE (M-mode): 1.5 cm LEFT ATRIUM           Index        RIGHT ATRIUM          Index LA diam:      2.80 cm 1.54 cm/m   RA Area:     9.89 cm LA Vol (A2C): 27.3 ml 14.99 ml/m  RA Volume:   19.60 ml 10.76 ml/m  AORTIC VALVE LVOT Vmax:   81.40 cm/s LVOT Vmean:  49.500 cm/s LVOT VTI:    0.091 m  AORTA Ao Root diam: 3.50 cm Ao Asc diam:  3.70 cm TRICUSPID VALVE TR Peak grad:   22.5 mmHg TR Vmax:        237.00 cm/s  SHUNTS Systemic VTI:  0.09 m Systemic Diam: 2.00 cm Oswaldo Milian MD Electronically signed by Oswaldo Milian MD Signature Date/Time: 12/03/2021/5:17:10 PM    Final    CT Angio Chest PE W and/or Wo Contrast  Result Date: 12/03/2021 CLINICAL DATA:  Shortness of breath.  Hypoxia. EXAM: CT ANGIOGRAPHY CHEST WITH CONTRAST TECHNIQUE: Multidetector CT imaging of the chest was performed using the standard protocol during bolus administration of intravenous contrast.  Multiplanar CT image reconstructions and MIPs were obtained to evaluate the vascular anatomy. RADIATION DOSE REDUCTION: This exam was performed according to the departmental dose-optimization program which includes automated exposure  control, adjustment of the mA and/or kV according to patient size and/or use of iterative reconstruction technique. CONTRAST:  106mL OMNIPAQUE IOHEXOL 350 MG/ML SOLN COMPARISON:  October 03, 2021. FINDINGS: Cardiovascular: Satisfactory opacification of the pulmonary arteries to the segmental level. No evidence of pulmonary embolism. Normal heart size. No pericardial effusion. Atherosclerosis of thoracic aorta is noted without aneurysm formation. Mediastinum/Nodes: No enlarged mediastinal, hilar, or axillary lymph nodes. Thyroid gland, trachea, and esophagus demonstrate no significant findings. Lungs/Pleura: Emphysematous disease is noted throughout both lungs. No pneumothorax is noted. Significant increased left basilar opacities are noted concerning for pneumonia or possibly atelectasis. There appears to be aspirated material within the left mainstem bronchus and more distal bronchi. Volume loss is noted on the left resulting in some mediastinal shift to the left. Stable scarring or inflammation is noted in the left upper lobe. Aspirated material or mucous plugging is noted in right lower lobe bronchi. Upper Abdomen: Cholelithiasis. Musculoskeletal: No chest wall abnormality. No acute or significant osseous findings. Review of the MIP images confirms the above findings. IMPRESSION: No definite evidence of pulmonary embolus. Significantly increased opacity is seen in the left lung base consistent with worsening pneumonia or possibly atelectasis. This results in volume loss and mediastinal shift to the left. There does appear to be aspirated material or fluid within the left mainstem bronchus and more peripheral bronchi. There is also noted aspirated material or mucous plugging in right  lower lobe bronchi. Aortic Atherosclerosis (ICD10-I70.0) and Emphysema (ICD10-J43.9). Electronically Signed   By: Marijo Conception M.D.   On: 12/03/2021 14:46   DG Chest Portable 1 View  Result Date: 12/03/2021 CLINICAL DATA:  Dyspnea EXAM: PORTABLE CHEST 1 VIEW COMPARISON:  10/03/2021 FINDINGS: Persistent left pleural effusion. Increased opacification of the left lung. Right lung remains essentially clear. Similar cardiomediastinal contours. IMPRESSION: Persistent left pleural effusion and increased opacification of the left lung. Electronically Signed   By: Macy Mis M.D.   On: 12/03/2021 13:07     LOS: 2 days   Oren Binet, MD  [facility]    To contact the attending provider between 7A-7P or the covering provider during after hours 7P-7A, please log into the web site [email or web address] and access using universal [place] password for that web site. If you do not have the password, please call the hospital operator.  12/05/2021, 11:20 AM

## 2021-12-05 NOTE — Progress Notes (Addendum)
Pt is refusing respiratory care. Pt states that he is fine and does not want to take treatments or CPT. RT stated that treatments are important but refuses anyways. RT will continue to monitor and will try again at a later time.

## 2021-12-05 NOTE — Progress Notes (Addendum)
Daily Progress Note   Patient Name: Chad Gomez       Date: 12/05/2021 DOB: [birth year]  Age: 76 y.o. MRN#: [id number] Attending Physician: Jonetta Osgood, MD Primary Care Physician: [name], MD Admit Date: 12/03/2021  Reason for Consultation/Follow-up: Establishing goals of care  Patient Profile/HPI:  76 y.o. male  with past medical history of stage IV lung cancer with malignant pleural effusions status post lobectomy-not on any chemotherapy-last seen by Dr. Earlie Server in February and was offered chemo versus hospice and he requested time to consider his decision at that time-I do not see any further follow-up, COPD, BPH, DM 2, from [place] facility where he has been since he was discharged from the hospital in April after an elective hernia repair- admitted on 12/03/2021 with shortness of breath, hypoxia, tachycardia, hypotension.  CT scan this admission shows concern for lymphangitic spread of his cancer continue to thickened areas of the pleura concerning for enlarging pleural mets and severe emphysema.  Concerns for sepsis related to CAP, COPD exacerbation. He is on high flow oxygen.  Palliative medicine consulted for above.  Subjective: Chart reviewed including labs, progress notes, imaging.  Met with patient, his spouse and daughter. Patient was awake and alert, but confused.  Patient's family is aware that patient has a terminal illness. They have noted some improvement initially when he was at SNF, but have also seen decline.  We discussed options of comfort care, hospice vs ongoing life prolonging care.  Patient's wife is unable to take patient home, even with hospice support. Patient's daughter also wishes to continue life prolonging care and patient  agrees. Therefore they would like to continue to treat what is treatable and hope for discharge back to patient's nursing facility. He is currently followed by outpatient Palliative- if her were go to SNF and decline further then they would want to transition to Hospice.  Review of Systems  Constitutional:  Positive for malaise/fatigue and weight loss.  Respiratory:  Positive for cough.      Physical Exam Vitals and nursing note reviewed.  Constitutional:      Appearance: He is ill-appearing.     Comments: Frail, cachectic  Pulmonary:     Comments: Productive cough            Vital Signs: BP 121/81  Pulse (!) 108   Temp 97.7 F (36.5 C) (Oral)   Resp 16   Ht 5' 10"  (1.778 m)   Wt 57.7 kg   SpO2 98%   BMI 18.25 kg/m  SpO2: SpO2: 98 % O2 Device: O2 Device: High Flow Nasal Cannula O2 Flow Rate: O2 Flow Rate (L/min): 2 L/min  Intake/output summary:  Intake/Output Summary (Last 24 hours) at 12/05/2021 1438 Last data filed at 12/04/2021 2000 Gross per 24 hour  Intake 370 ml  Output 350 ml  Net 20 ml   LBM: Last BM Date :  (unknown) Baseline Weight: Weight: 64 kg Most recent weight: Weight: 57.7 kg       Palliative Assessment/Data: PPS: 40%      Patient Active Problem List   Diagnosis Date Noted   Hypoxia 12/03/2021   PVC's (premature ventricular contractions) 11/19/2021   History of incisional hernia repair 10/03/2021   Incisional hernia 10/01/2021   PVD (peripheral vascular disease) ([place]) 08/29/2021   Cellulitis of chest wall 08/26/2021   Malignant pleural effusion 05/30/2021   Lung nodule 05/08/2021   Protein-calorie malnutrition, severe 12/28/2019   Leukopenia due to antineoplastic chemotherapy ([place]) 12/26/2019   Sepsis ([place]) 12/26/2019   Ventral hernia 12/26/2019   Chronic indwelling Foley catheter 12/26/2019   Encounter for antineoplastic immunotherapy 12/19/2019   Adenocarcinoma of left lung, stage 3 ([place]) 12/16/2019   Encounter for antineoplastic  chemotherapy 11/30/2019   Goals of care, counseling/discussion 11/09/2019   Lung cancer, lingula ([place]) 11/01/2019   S/P partial lobectomy of lung 10/28/2019   Dyslipidemia 10/06/2019   Type 2 diabetes mellitus with complication, without long-term current use of insulin ([place]) 10/06/2019   Tobacco abuse 09/06/2019   Hypertension 09/06/2019   Nephrolithiasis 09/06/2019   Melanoma ([place]) 09/06/2019   Centrilobular emphysema ([place]) 05/23/2019   Stage 4 very severe COPD by GOLD classification ([place]) 05/23/2019   Atherosclerosis of native artery of both lower extremities with intermittent claudication ([place]) 05/26/2016   S/P aortobifemoral bypass surgery 05/26/2016   Ureteral stone with hydronephrosis 03/21/2014   Ulnar neuropathy 02/07/2014   Compulsive tobacco user syndrome 02/07/2014   Allergic rhinitis 02/07/2014    Palliative Care Assessment & Plan    Assessment/Recommendations/Plan  Continue to treat what is treatable Hoping for d/c to SNF with palliative- if declines then transition to Hospice   Code Status: DNR  Prognosis:  Unable to determine  Discharge Planning: [place] for rehab with Palliative care service follow-up  Care plan was discussed with patient and spouse  Thank you for allowing the Palliative Medicine Team to assist in the care of this patient.  Total time: 70 minutes  Greater than 50%  of this time was spent counseling and coordinating care related to the above assessment and plan.  Mariana Kaufman, AGNP-C Palliative Medicine   Please contact Palliative Medicine Team phone at [phone] for questions and concerns.

## 2021-12-05 NOTE — Plan of Care (Signed)
  Problem: Education: Goal: Ability to describe self-care measures that may prevent or decrease complications (Diabetes Survival Skills Education) will improve Outcome: Progressing Goal: Individualized Educational Video(s) Outcome: Progressing   Problem: Coping: Goal: Ability to adjust to condition or change in health will improve Outcome: Progressing   Problem: Fluid Volume: Goal: Ability to maintain a balanced intake and output will improve Outcome: Progressing   Problem: Health Behavior/Discharge Planning: Goal: Ability to identify and utilize available resources and services will improve Outcome: Progressing Goal: Ability to manage health-related needs will improve Outcome: Progressing   Problem: Metabolic: Goal: Ability to maintain appropriate glucose levels will improve Outcome: Progressing   Problem: Nutritional: Goal: Maintenance of adequate nutrition will improve Outcome: Progressing Goal: Progress toward achieving an optimal weight will improve Outcome: Progressing   Problem: Skin Integrity: Goal: Risk for impaired skin integrity will decrease Outcome: Progressing   Problem: Tissue Perfusion: Goal: Adequacy of tissue perfusion will improve Outcome: Progressing   Problem: Health Behavior/Discharge Planning: Goal: Ability to manage health-related needs will improve Outcome: Progressing   Problem: Clinical Measurements: Goal: Ability to maintain clinical measurements within normal limits will improve Outcome: Progressing Goal: Will remain free from infection Outcome: Progressing Goal: Diagnostic test results will improve Outcome: Progressing Goal: Respiratory complications will improve Outcome: Progressing Goal: Cardiovascular complication will be avoided Outcome: Progressing   Problem: Activity: Goal: Risk for activity intolerance will decrease Outcome: Progressing   Problem: Coping: Goal: Level of anxiety will decrease Outcome: Progressing    Problem: Elimination: Goal: Will not experience complications related to bowel motility Outcome: Progressing Goal: Will not experience complications related to urinary retention Outcome: Progressing   Problem: Pain Managment: Goal: General experience of comfort will improve Outcome: Progressing   Problem: Skin Integrity: Goal: Risk for impaired skin integrity will decrease Outcome: Progressing   Problem: Safety: Goal: Ability to remain free from injury will improve Outcome: Progressing

## 2021-12-06 ENCOUNTER — Inpatient Hospital Stay: Payer: Self-pay

## 2021-12-06 ENCOUNTER — Inpatient Hospital Stay (HOSPITAL_COMMUNITY): Payer: Medicare Other

## 2021-12-06 DIAGNOSIS — R0902 Hypoxemia: Secondary | ICD-10-CM | POA: Diagnosis not present

## 2021-12-06 LAB — GLUCOSE, CAPILLARY
Glucose-Capillary: 92 mg/dL (ref 70–99)
Glucose-Capillary: 60 mg/dL — ABNORMAL LOW (ref 70–99)
Glucose-Capillary: 109 mg/dL — ABNORMAL HIGH (ref 70–99)
Glucose-Capillary: 85 mg/dL (ref 70–99)
Glucose-Capillary: 126 mg/dL — ABNORMAL HIGH (ref 70–99)

## 2021-12-06 MED ORDER — ACETAMINOPHEN 650 MG RE SUPP
650.0000 mg | Freq: Four times a day (QID) | RECTAL | Status: DC | PRN
Start: 2021-12-06 — End: 2021-12-10
  Administered 2021-12-06: 650 mg via RECTAL
  Filled 2021-12-06: qty 1

## 2021-12-06 MED ORDER — DEXTROSE 50 % IV SOLN
25.0000 mL | INTRAVENOUS | Status: DC | PRN
  Administered 2021-12-06: 25 mL via INTRAVENOUS
  Filled 2021-12-06: qty 50

## 2021-12-06 MED ORDER — LORAZEPAM 2 MG/ML IJ SOLN
0.2500 mg | Freq: Once | INTRAMUSCULAR | Status: AC
  Administered 2021-12-06: 0.25 mg via INTRAVENOUS
  Filled 2021-12-06: qty 1

## 2021-12-06 NOTE — Progress Notes (Signed)
TRH night cross cover note:  I was notified by RN of the patient's complaint of abdominal discomfort as well as having difficulty sleeping, with some evidence of restlessness as well.  RN also conveys that the patient is not safe to take p.o. meds in the context of known dysphagia.   I subsequently placed order for prn Tylenol suppository for discomfort, as well as a small one-time dose of Ativan 0.25 mg IV x 1 to attend to the patient's insomnia and restlessness.     Babs Bertin, DO Hospitalist

## 2021-12-06 NOTE — NC FL2 (Signed)
[place] LEVEL OF CARE SCREENING TOOL     IDENTIFICATION  Patient Name: Chad Gomez Birthdate: October 19, 1945 Sex: male Admission Date (Current Location): 12/03/2021  [facility] and [place] Number:  Herbalist and Address:  [facility], [place] [address] [place],  [address]      Provider Number: [id number]  Attending Physician Name and Address:  Thurnell Lose, MD  Relative Name and Phone Number:       Current Level of Care: Hospital Recommended Level of Care: [place] Prior Approval Number:    Date Approved/Denied:   PASRR Number: [id number]  Discharge Plan: SNF    Current Diagnoses: Patient Active Problem List   Diagnosis Date Noted   Hypoxia 12/03/2021   PVC's (premature ventricular contractions) 11/19/2021   History of incisional hernia repair 10/03/2021   Incisional hernia 10/01/2021   PVD (peripheral vascular disease) ([place]) 08/29/2021   Cellulitis of chest wall 08/26/2021   Malignant pleural effusion 05/30/2021   Lung nodule 05/08/2021   Protein-calorie malnutrition, severe 12/28/2019   Leukopenia due to antineoplastic chemotherapy ([place]) 12/26/2019   Sepsis ([place]) 12/26/2019   Ventral hernia 12/26/2019   Chronic indwelling Foley catheter 12/26/2019   Encounter for antineoplastic immunotherapy 12/19/2019   Adenocarcinoma of left lung, stage 3 ([place]) 12/16/2019   Encounter for antineoplastic chemotherapy 11/30/2019   Goals of care, counseling/discussion 11/09/2019   Lung cancer, lingula ([place]) 11/01/2019   S/P partial lobectomy of lung 10/28/2019   Dyslipidemia 10/06/2019   Type 2 diabetes mellitus with complication, without long-term current use of insulin ([place]) 10/06/2019   Tobacco abuse 09/06/2019   Hypertension 09/06/2019   Nephrolithiasis 09/06/2019   Melanoma ([place]) 09/06/2019   Centrilobular emphysema ([place]) 05/23/2019   Stage 4 very severe COPD by GOLD classification ([place])  05/23/2019   Atherosclerosis of native artery of both lower extremities with intermittent claudication ([place]) 05/26/2016   S/P aortobifemoral bypass surgery 05/26/2016   Ureteral stone with hydronephrosis 03/21/2014   Ulnar neuropathy 02/07/2014   Compulsive tobacco user syndrome 02/07/2014   Allergic rhinitis 02/07/2014    Orientation RESPIRATION BLADDER Height & Weight     Self  O2 (2L nasal cannula) Continent, Indwelling catheter Weight: 127 lb 3.3 oz (57.7 kg) Height:  5\' 10"  (177.8 cm)  BEHAVIORAL SYMPTOMS/MOOD NEUROLOGICAL BOWEL NUTRITION STATUS      Continent Diet (See dc summary)  AMBULATORY STATUS COMMUNICATION OF NEEDS Skin   Extensive Assist Verbally Surgical wounds, Other (Comment) (Deep tissue injury on sacrum; closed incision on abdomen)                       Personal Care Assistance Level of Assistance  Bathing, Feeding, Dressing Bathing Assistance: Maximum assistance Feeding assistance: Independent Dressing Assistance: Limited assistance     Functional Limitations Info  Sight Sight Info: Impaired        SPECIAL CARE FACTORS FREQUENCY  PT (By licensed PT), OT (By licensed OT)     PT Frequency: 5x/week OT Frequency: 5x/week            Contractures Contractures Info: Not present    Additional Factors Info  Code Status, Allergies, Insulin Sliding Scale, Isolation Precautions Code Status Info: DNR Allergies Info: Morphine And Related, Neurontin (Gabapentin), Tape, Allevyn Adhesive (Wound Dressings), Amoxil (Amoxicillin), Naprosyn (Naproxen)   Insulin Sliding Scale Info: See dc summary Isolation Precautions Info: Rhino virus PNA     Current Medications (12/06/2021):  This is the current  hospital active medication list Current Facility-Administered Medications  Medication Dose Route Frequency Provider Last Rate Last Admin   0.9 %  sodium chloride infusion  250 mL Intravenous Continuous Simonne Maffucci B, MD       acetaminophen (TYLENOL)  suppository 650 mg  650 mg Rectal Q6H PRN Howerter, Justin B, DO   650 mg at 12/06/21 0229   arformoterol (BROVANA) nebulizer solution 15 mcg  15 mcg Nebulization BID Simonne Maffucci B, MD   15 mcg at 12/06/21 0838   azithromycin (ZITHROMAX) 500 mg in sodium chloride 0.9 % 250 mL IVPB  500 mg Intravenous Q24H Simonne Maffucci B, MD 250 mL/hr at 12/06/21 1626 500 mg at 12/06/21 1626   ceFEPIme (MAXIPIME) 2 g in sodium chloride 0.9 % 100 mL IVPB  2 g Intravenous Q12H Lala Lund K, MD 200 mL/hr at 12/06/21 1003 2 g at 12/06/21 1003   chlorhexidine (PERIDEX) 0.12 % solution 15 mL  15 mL Mouth Rinse BID Simonne Maffucci B, MD   15 mL at 12/06/21 1003   Chlorhexidine Gluconate Cloth 2 % PADS 6 each  6 each Topical Q0600 Simonne Maffucci B, MD   6 each at 12/06/21 1000   dextrose 50 % solution 25 mL  25 mL Intravenous Q4H PRN Thurnell Lose, MD   25 mL at 12/06/21 1000   docusate sodium (COLACE) capsule 100 mg  100 mg Oral BID PRN Juanito Doom, MD       finasteride (PROSCAR) tablet 5 mg  5 mg Oral Daily Simonne Maffucci B, MD       heparin injection 5,000 Units  5,000 Units Subcutaneous Q8H Simonne Maffucci B, MD   5,000 Units at 12/06/21 1433   insulin aspart (novoLOG) injection 0-5 Units  0-5 Units Subcutaneous QHS McQuaid, Nathaneil Canary B, MD       insulin aspart (novoLOG) injection 0-9 Units  0-9 Units Subcutaneous TID WC Simonne Maffucci B, MD   2 Units at 12/04/21 1742   ipratropium-albuterol (DUONEB) 0.5-2.5 (3) MG/3ML nebulizer solution 3 mL  3 mL Nebulization Q4H PRN Simonne Maffucci B, MD   3 mL at 12/05/21 1119   MEDLINE mouth rinse  15 mL Mouth Rinse q12n4p Simonne Maffucci B, MD   15 mL at 12/06/21 1630   methylPREDNISolone sodium succinate (SOLU-MEDROL) 40 mg/mL injection 40 mg  40 mg Intravenous Daily Simonne Maffucci B, MD   40 mg at 12/06/21 1002   nicotine (NICODERM CQ - dosed in mg/24 hours) patch 21 mg  21 mg Transdermal Daily Simonne Maffucci B, MD   21 mg at 12/06/21 1030    Oral care mouth rinse  15 mL Mouth Rinse PRN Simonne Maffucci B, MD       polyethylene glycol (MIRALAX / GLYCOLAX) packet 17 g  17 g Oral Daily PRN Juanito Doom, MD       revefenacin (YUPELRI) nebulizer solution 175 mcg  175 mcg Nebulization Daily Simonne Maffucci B, MD   175 mcg at 12/06/21 1610   sodium chloride HYPERTONIC 3 % nebulizer solution 4 mL  4 mL Nebulization BID Simonne Maffucci B, MD   4 mL at 12/06/21 9604     Discharge Medications: Please see discharge summary for a list of discharge medications.  Relevant Imaging Results:  Relevant Lab Results:   Additional Information SSN: [id number]  [place], LCSW

## 2021-12-06 NOTE — TOC Initial Note (Signed)
Transition of Care [facility]) - Initial/Assessment Note    Patient Details  Name: Chad Gomez MRN: [id number] Date of Birth: [birth year]  Transition of Care [facility] CM/SW Contact:    Benard Halsted, LCSW Phone Number: 12/06/2021, 6:22 PM  Clinical Narrative:                 CSW spoke with patient's spouse. She confirmed plan to return to [facility] via [place] at discharge.   Per [facility], patient is currently private pay there but they requested CSW try for rehab auth. CSW submitted clinicals to [facility] for review, though that will not hold discharge.   Expected Discharge Plan: Skilled Nursing Facility Barriers to Discharge: Continued Medical Work up   Patient Goals and CMS Choice Patient states their goals for this hospitalization and ongoing recovery are:: Return to snf CMS Medicare.gov Compare Post Acute Care list provided to:: Patient Represenative (must comment) Choice offered to / list presented to : Spouse  Expected Discharge Plan and Services Expected Discharge Plan: [place] In-house Referral: Clinical Social Work   Post Acute Care Choice: [place] Living arrangements for the past 2 months: [place]                                      Prior Living Arrangements/Services Living arrangements for the past 2 months: [place] Lives with:: Facility Resident Patient language and need for interpreter reviewed:: Yes Do you feel safe going back to the place where you live?: Yes      Need for Family Participation in Patient Care: Yes (Comment) Care giver support system in place?: Yes (comment)   Criminal Activity/Legal Involvement Pertinent to Current Situation/Hospitalization: No - Comment as needed  Activities of Daily Living      Permission Sought/Granted Permission sought to share information with : Facility Sport and exercise psychologist, Family Supports Permission granted to share information  with : No  Share Information with NAME: Stanton Kidney  Permission granted to share info w AGENCY: [facility]  Permission granted to share info w Relationship: Spouse  Permission granted to share info w Contact Information: [phone]  Emotional Assessment Appearance:: Appears stated age Attitude/Demeanor/Rapport: Unable to Assess Affect (typically observed): Unable to Assess Orientation: : Oriented to Self Alcohol / Substance Use: Not Applicable Psych Involvement: No (comment)  Admission diagnosis:  Hypoxia [R09.02] Patient Active Problem List   Diagnosis Date Noted   Hypoxia 12/03/2021   PVC's (premature ventricular contractions) 11/19/2021   History of incisional hernia repair 10/03/2021   Incisional hernia 10/01/2021   PVD (peripheral vascular disease) ([place]) 08/29/2021   Cellulitis of chest wall 08/26/2021   Malignant pleural effusion 05/30/2021   Lung nodule 05/08/2021   Protein-calorie malnutrition, severe 12/28/2019   Leukopenia due to antineoplastic chemotherapy ([place]) 12/26/2019   Sepsis ([place]) 12/26/2019   Ventral hernia 12/26/2019   Chronic indwelling Foley catheter 12/26/2019   Encounter for antineoplastic immunotherapy 12/19/2019   Adenocarcinoma of left lung, stage 3 ([place]) 12/16/2019   Encounter for antineoplastic chemotherapy 11/30/2019   Goals of care, counseling/discussion 11/09/2019   Lung cancer, lingula ([place]) 11/01/2019   S/P partial lobectomy of lung 10/28/2019   Dyslipidemia 10/06/2019   Type 2 diabetes mellitus with complication, without long-term current use of insulin ([place]) 10/06/2019   Tobacco abuse 09/06/2019   Hypertension 09/06/2019   Nephrolithiasis 09/06/2019   Melanoma ([place]) 09/06/2019   Centrilobular emphysema ([place])  05/23/2019   Stage 4 very severe COPD by GOLD classification ([place]) 05/23/2019   Atherosclerosis of native artery of both lower extremities with intermittent claudication ([place]) 05/26/2016   S/P aortobifemoral bypass surgery 05/26/2016    Ureteral stone with hydronephrosis 03/21/2014   Ulnar neuropathy 02/07/2014   Compulsive tobacco user syndrome 02/07/2014   Allergic rhinitis 02/07/2014   PCP:  [name], MD Pharmacy:   [facility] ([facility] [place] [address] [place] ([place]) [place] [address] Phone: [phone] Fax: [phone]     Social Determinants of Health (SDOH) Interventions    Readmission Risk Interventions     No data to display

## 2021-12-06 NOTE — Progress Notes (Signed)
Pt was NTS and tolerated well. Pt states he feels better and is currently stable at this time, RT will monitor.

## 2021-12-06 NOTE — Progress Notes (Signed)
Room made dark, TV off and encouraged pt to try and sleep. Pt not sleeping and is calm but frequently restless, fidgeting with gown, blankets, pulse oximeter. When RN asked if having pain, pt says yes and points to abdomen near healing incision.  MD notified. Tylenol suppository given and IV ativan given. Pt repositioned and pt finally able to go to sleep.

## 2021-12-06 NOTE — Progress Notes (Signed)
PROGRESS NOTE        PATIENT DETAILS Name: Chad Gomez Age: 76 y.o. Sex: male Date of Birth: 11/06/1945 Admit Date: 12/03/2021 Admitting Physician Julian Hy, DO YIR:SWNIO, Thayer Jew, MD  Brief Summary: Patient is a 76 y.o.  male COPD, metastatic non-small cell carcinoma-s/p LUL lobectomy 2021-intolerant to chemo-currently on observation-presented to the hospital from SNF with shortness of breath-found to have acute hypoxic and hypercapnic respiratory failure due to PNA.  Admitted by PCCM to the ICU-required BiPAP-upon improvement transferred to [facility].   Significant events: 6/13>> to ED from SNF-SOB-hypoxic/hypercarbic-PNA-COPD exacerbation-started BiPAP-transfer to ICU. 6/15>> now DNR-transfer to [facility].  Significant studies: 6/13>> CTA chest: No PE-significant increased opacity in the left lung base-likely worsening PNA.  Aspirated/mucous plugging in the right lower lobe bronchi. 6/13>> Echo: EF 65-70%, RVSP 30.5  Significant microbiology data: 6/13>> COVID PCR: Negative 6/13>> urine culture: Multiple species 6/13>> blood culture: No growth 6/13>> respiratory virus panel: Rhinovirus/enterovirus  Procedures: None  Consults: PCCM Palliative care  Subjective: Patient in bed, appears comfortable, denies any headache, no fever, no chest pain or pressure, no shortness of breath , no abdominal pain or any abdominal discomfort. No focal weakness.  Objective: Vitals: Blood pressure (!) 144/96, pulse 85, temperature 97.6 F (36.4 C), temperature source Oral, resp. rate 18, height 5\' 10"  (1.778 m), weight 57.7 kg, SpO2 94 %.   Exam:  Frail, cachectic, elderly Caucasian male lying in hospital bed, awake Lee confused, no focal deficits, [place].AT,PERRAL Supple Neck, No JVD,   Symmetrical Chest wall movement, Good air movement bilaterally, CTAB RRR,No Gallops, Rubs or new Murmurs,  +ve B.Sounds, Abd Soft, No tenderness,   No Cyanosis, Clubbing or edema    Assessment/Plan:  Acute hypoxic and hypercarbic respiratory failure due to aspiration PNA +/- Rhino virus PNA and COPD exacerbation: Stable on 2-3 L of O2-is awake and alert-continue cefepime/Zithromax/steroids and bronchodilators.  Very poor prognosis-very frail/cachectic appearing-, although he is DNR family wants to pursue medical treatment, seen by palliative care.  Long-term prognosis appears to be extremely poor due to his stage IV metastatic non-small cell lung cancer, advanced age and cachectic state.  History of stage IV non-small cell cancer of the lung-s/p left upper lobe lobectomy 10/28/2019: He was tried on chemotherapy by Dr. Fernand Parkins was discontinued after 2 cycles due to intolerance.  Sequently developed a malignant pleural effusion-requiring a Pleurx catheter that was since removed.    Septic shock: Due to PNA-resolved.  On antibiotics-culture data as above  Dysphagia in the setting of severe frailty/cachexia: Reviewed prior PCCM note-on dysphagia 3 diet with known aspiration risk.  Await goals of care discussion with family-if not transition to comfort measures-May benefit from SLP evaluation to ensure he is on a appropriate diet.    AKI: Likely hemodynamically mediated in the setting of sepsis/PNA-resolved with supportive care.  Hypoglycemia: Due to poor oral intake-continue supportive care-follow CBGs-encourage oral intake.  If hypoglycemia recurs in spite of oral intake-we can consider further work-up but given overall poor prognosis-doubt this will change management.  BPH with chronic indwelling Foley catheter: Continue Proscar-Foley remains in place.  HTN: BP stable-all antihypertensives on hold  HLD: Statin on hold  Pressure Ulcer: Pressure Injury 12/03/21 Sacrum Upper Deep Tissue Pressure Injury - Purple or maroon localized area of discolored intact skin or blood-filled blister due to damage of underlying soft tissue from pressure  and/or shear. (Active)  12/03/21  1600  Location: Sacrum  Location Orientation: Upper  Staging: Deep Tissue Pressure Injury - Purple or maroon localized area of discolored intact skin or blood-filled blister due to damage of underlying soft tissue from pressure and/or shear.  Wound Description (Comments):   Present on Admission: Yes  Dressing Type Foam - Lift dressing to assess site every shift 12/05/21 1915    Severe protein calorie malnutrition/cachexia: Related to underlying advanced malignancy   Underweight: Estimated body mass index is 18.25 kg/m as calculated from the following:   Height as of this encounter: 5\' 10"  (1.778 m).   Weight as of this encounter: 57.7 kg.   Code status:   Code Status: DNR   DVT Prophylaxis: heparin injection 5,000 Units Start: 12/03/21 1430 SCDs Start: 12/03/21 1412   Family Communication: None at bedside-palliative care meeting with family later today.   Disposition Plan: Status is: Inpatient Remains inpatient appropriate because: Aspiration PNA-COPD-not yet stable for discharge-palliative care meeting with family for further ongoing goals of care discussion.   Planned Discharge Destination:Skilled nursing facility versus hospice   Diet: Diet Order             DIET DYS 3 Room service appropriate? Yes; Fluid consistency: Thin  Diet effective now                    MEDICATIONS: Scheduled Meds:  arformoterol  15 mcg Nebulization BID   chlorhexidine  15 mL Mouth Rinse BID   Chlorhexidine Gluconate Cloth  6 each Topical Q0600   finasteride  5 mg Oral Daily   heparin  5,000 Units Subcutaneous Q8H   insulin aspart  0-5 Units Subcutaneous QHS   insulin aspart  0-9 Units Subcutaneous TID WC   mouth rinse  15 mL Mouth Rinse q12n4p   methylPREDNISolone (SOLU-MEDROL) injection  40 mg Intravenous Daily   nicotine  21 mg Transdermal Daily   revefenacin  175 mcg Nebulization Daily   sodium chloride HYPERTONIC  4 mL Nebulization BID   Continuous Infusions:  sodium  chloride     azithromycin 500 mg (12/05/21 1741)   ceFEPime (MAXIPIME) IV 2 g (12/06/21 1003)   PRN Meds:.acetaminophen, dextrose, docusate sodium, ipratropium-albuterol, mouth rinse, polyethylene glycol   I have personally reviewed following labs and imaging studies  LABORATORY DATA:  Recent Labs  Lab 12/03/21 1316 12/03/21 1344 12/04/21 0549  WBC 12.0*  --  9.5  HGB 13.1 13.3 11.4*  HCT 42.0 39.0 34.9*  PLT 326  --  254  MCV 95.7  --  91.4  MCH 29.8  --  29.8  MCHC 31.2  --  32.7  RDW 14.6  --  14.8  LYMPHSABS 1.5  --   --   MONOABS 0.8  --   --   EOSABS 0.2  --   --   BASOSABS 0.1  --   --     Recent Labs  Lab 12/03/21 1316 12/03/21 1344 12/03/21 1559 12/03/21 1813 12/04/21 0736 12/05/21 0053  NA 140 137  --   --  141 138  K 5.1 4.9  --   --  4.6 4.3  CL 104  --   --   --  109 104  CO2 21*  --   --   --  24 21*  GLUCOSE 239*  --   --   --  88 103*  BUN 49*  --   --   --  41* 35*  CREATININE 1.61*  --   --   --  1.26* 1.16  CALCIUM 10.2  --   --   --  9.3 9.4  AST 25  --   --   --   --   --   ALT 12  --   --   --   --   --   ALKPHOS 111  --   --   --   --   --   BILITOT 1.0  --   --   --   --   --   ALBUMIN 2.8*  --   --   --   --   --   MG  --   --   --   --  1.4*  --   PHOS  --   --   --   --  2.4*  --   LATICACIDVEN  --   --  5.5* 3.8* 3.0*  --   INR 1.2  --   --   --   --   --   BNP 57.9  --   --   --   --   --      RADIOLOGY STUDIES/RESULTS: DG Chest Port 1 View  Result Date: 12/06/2021 CLINICAL DATA:  Nausea shortness of breath EXAM: PORTABLE CHEST 1 VIEW COMPARISON:  Chest radiograph and CTA chest 12/03/2021 FINDINGS: The cardiomediastinal silhouette is stable, with unchanged calcified atherosclerotic plaque of the aortic arch Patchy and confluent opacities are again seen throughout the left lower lung with blunting of the left costophrenic angle likely reflecting layering pleural fluid. Compared to the study from 12/03/2021, aeration is stable  to slightly improved. The right lung remains clear. There is no significant right effusion. There is no pneumothorax. The imaged bones are unremarkable. IMPRESSION: Persistent opacities projecting over the left lower lung with a left pleural effusion, overall stable to slightly improved since 12/03/2021. Electronically Signed   By: Valetta Mole M.D.   On: 12/06/2021 08:08   DG Abd Portable 1V  Result Date: 12/06/2021 CLINICAL DATA:  Nausea. EXAM: PORTABLE ABDOMEN - 1 VIEW COMPARISON:  None Available. FINDINGS: Gas is present in the colon and rectum. The cecum/ascending colon is moderately distended by stool and gas. The remainder of the colon is normal in caliber. Scattered gas is present in nondilated loops of small bowel. Atherosclerotic vascular calcifications are noted. The lungs are reported separately. IMPRESSION: Moderately distended cecum/ascending colon by stool and gas. No evidence of bowel obstruction. Electronically Signed   By: Logan Bores M.D.   On: 12/06/2021 08:07     LOS: 3 days   Signature  Lala Lund M.D on 12/06/2021 at 11:40 AM   -  To page go to [email or web address]

## 2021-12-06 NOTE — Evaluation (Signed)
Physical Therapy Evaluation Patient Details Name: Chad Gomez MRN: [id number] DOB: Nov 04, 1945 Today's Date: 12/06/2021  History of Present Illness  pt is a 76 y/o male admitted 6/13 from SNF with SOB, found to have acute hypoxic and hypercapnic respiratory failure due to PNA.  PMHx Stage 4 NSCLC, s/p L UL lobectomy 2921, COPD, DM, HTN, PVD  Clinical Impression  Pt admitted with/for SOB for multiple reasons including PNA.  Pt needing min assist for basic/limited mobility.  Sit to stand, transfers or gait not assessed.  Pt currently limited functionally due to the problems listed. ( See problems list.)   Pt will benefit from PT to maximize function and safety in order to get ready for next venue listed below.        Recommendations for follow up therapy are one component of a multi-disciplinary discharge planning process, led by the attending physician.  Recommendations may be updated based on patient status, additional functional criteria and insurance authorization.  Follow Up Recommendations Skilled nursing-short term rehab (<3 hours/day)    Assistance Recommended at Discharge Frequent or constant Supervision/Assistance  Patient can return home with the following  A lot of help with walking and/or transfers;A lot of help with bathing/dressing/bathroom;Assistance with cooking/housework;Direct supervision/assist for medications management;Direct supervision/assist for financial management;Assist for transportation;Help with stairs or ramp for entrance    Equipment Recommendations Other (comment) (TBD)  Recommendations for Other Services       Functional Status Assessment Patient has had a recent decline in their functional status and demonstrates the ability to make significant improvements in function in a reasonable and predictable amount of time.     Precautions / Restrictions Precautions Precautions: Fall      Mobility  Bed Mobility Overal bed mobility: Needs  Assistance Bed Mobility: Supine to Sit, Sit to Supine     Supine to sit: Min assist Sit to supine: Min guard   General bed mobility comments: cues for hand placement/sequencing of task, assist stability when pt allowed.   scooted to EOB with small weak symmetrical scoots.  Pt able to assist scooting up in bed with UE's    Transfers                   General transfer comment: pt deferred attempts to stand.    Ambulation/Gait                  Stairs            Wheelchair Mobility    Modified Rankin (Stroke Patients Only)       Balance Overall balance assessment: Needs assistance Sitting-balance support: Single extremity supported, Bilateral upper extremity supported, Feet supported Sitting balance-Leahy Scale: Poor Sitting balance - Comments: able to maintain sitting with UE assist and no external support                                     Pertinent Vitals/Pain Pain Assessment Pain Assessment: Faces Faces Pain Scale: Hurts little more Pain Location: general/abdoment Pain Intervention(s): Monitored during session, Limited activity within patient's tolerance, Repositioned    Home Living Family/patient expects to be discharged to:: Skilled nursing facility Living Arrangements: Spouse/significant other Available Help at Discharge: Family;Available 24 hours/day Type of Home: House Home Access: Stairs to enter       Home Layout: One level        Prior Function Prior Level of Function : Patient  poor historian/Family not available             Mobility Comments: Uses SPC during all mobility offload       Hand Dominance   Dominant Hand: Right    Extremity/Trunk Assessment   Upper Extremity Assessment Upper Extremity Assessment: Generalized weakness    Lower Extremity Assessment Lower Extremity Assessment: Generalized weakness    Cervical / Trunk Assessment Cervical / Trunk Assessment: Kyphotic  Communication    Communication:  (difficulty understanding his speech with dry mouth and no teeth.)  Cognition Arousal/Alertness: Awake/alert Behavior During Therapy: Anxious, Flat affect Overall Cognitive Status: No family/caregiver present to determine baseline cognitive functioning                                          General Comments      Exercises     Assessment/Plan    PT Assessment Patient needs continued PT services  PT Problem List Decreased strength;Decreased activity tolerance;Decreased balance;Decreased mobility;Decreased coordination;Cardiopulmonary status limiting activity       PT Treatment Interventions DME instruction;Gait training;Functional mobility training;Therapeutic activities;Therapeutic exercise;Balance training;Patient/family education    PT Goals (Current goals can be found in the Care Plan section)  Acute Rehab PT Goals Patient Stated Goal: pt did not state PT Goal Formulation: Patient unable to participate in goal setting Time For Goal Achievement: 12/20/21 Potential to Achieve Goals: Poor    Frequency Min 2X/week     Co-evaluation               AM-PAC PT "6 Clicks" Mobility  Outcome Measure Help needed turning from your back to your side while in a flat bed without using bedrails?: A Little Help needed moving from lying on your back to sitting on the side of a flat bed without using bedrails?: A Little Help needed moving to and from a bed to a chair (including a wheelchair)?: A Lot Help needed standing up from a chair using your arms (e.g., wheelchair or bedside chair)?: A Lot Help needed to walk in hospital room?: Total Help needed climbing 3-5 steps with a railing? : Total 6 Click Score: 12    End of Session   Activity Tolerance: Patient limited by fatigue;Patient limited by pain Patient left: in bed;with call bell/phone within reach;with bed alarm set (mitts) Nurse Communication: Mobility status PT Visit Diagnosis: Other  abnormalities of gait and mobility (R26.89);Muscle weakness (generalized) (M62.81);Adult, failure to thrive (R62.7)    Time: 1235-1253 PT Time Calculation (min) (ACUTE ONLY): 18 min   Charges:   PT Evaluation $PT Eval Moderate Complexity: 1 Mod          12/06/2021  Ginger Carne., PT Acute Rehabilitation Services [phone]  (pager) [phone]  (office)  Tessie Fass Soloman Mckeithan 12/06/2021, 2:43 PM

## 2021-12-06 NOTE — Progress Notes (Signed)
RT was called to assess pt. Pt states he is breathing fine and shows no signs of distress. RT will continue to monitor.

## 2021-12-07 DIAGNOSIS — R0902 Hypoxemia: Secondary | ICD-10-CM | POA: Diagnosis not present

## 2021-12-07 LAB — CBC WITH DIFFERENTIAL/PLATELET
Abs Immature Granulocytes: 0.12 10*3/uL — ABNORMAL HIGH (ref 0.00–0.07)
Basophils Absolute: 0 10*3/uL (ref 0.0–0.1)
Basophils Relative: 0 %
Eosinophils Absolute: 0 10*3/uL (ref 0.0–0.5)
Eosinophils Relative: 0 %
HCT: 33.6 % — ABNORMAL LOW (ref 39.0–52.0)
Hemoglobin: 11.1 g/dL — ABNORMAL LOW (ref 13.0–17.0)
Immature Granulocytes: 1 %
Lymphocytes Relative: 4 %
Lymphs Abs: 0.5 10*3/uL — ABNORMAL LOW (ref 0.7–4.0)
MCH: 29.8 pg (ref 26.0–34.0)
MCHC: 33 g/dL (ref 30.0–36.0)
MCV: 90.1 fL (ref 80.0–100.0)
Monocytes Absolute: 0.7 10*3/uL (ref 0.1–1.0)
Monocytes Relative: 6 %
Neutro Abs: 11.4 10*3/uL — ABNORMAL HIGH (ref 1.7–7.7)
Neutrophils Relative %: 89 %
Platelets: 320 10*3/uL (ref 150–400)
RBC: 3.73 MIL/uL — ABNORMAL LOW (ref 4.22–5.81)
RDW: 14.6 % (ref 11.5–15.5)
WBC: 12.8 10*3/uL — ABNORMAL HIGH (ref 4.0–10.5)
nRBC: 0 % (ref 0.0–0.2)

## 2021-12-07 LAB — CULTURE, BLOOD (ROUTINE X 2)

## 2021-12-07 LAB — GLUCOSE, CAPILLARY
Glucose-Capillary: 72 mg/dL (ref 70–99)
Glucose-Capillary: 88 mg/dL (ref 70–99)
Glucose-Capillary: 92 mg/dL (ref 70–99)

## 2021-12-07 LAB — COMPREHENSIVE METABOLIC PANEL
ALT: 12 U/L (ref 0–44)
AST: 19 U/L (ref 15–41)
Albumin: 2.4 g/dL — ABNORMAL LOW (ref 3.5–5.0)
Alkaline Phosphatase: 92 U/L (ref 38–126)
Anion gap: 11 (ref 5–15)
BUN: 31 mg/dL — ABNORMAL HIGH (ref 8–23)
CO2: 19 mmol/L — ABNORMAL LOW (ref 22–32)
Calcium: 9.6 mg/dL (ref 8.9–10.3)
Chloride: 108 mmol/L (ref 98–111)
Creatinine, Ser: 1.28 mg/dL — ABNORMAL HIGH (ref 0.61–1.24)
GFR, Estimated: 59 mL/min — ABNORMAL LOW (ref >60)
Glucose, Bld: 123 mg/dL — ABNORMAL HIGH (ref 70–99)
Potassium: 4.5 mmol/L (ref 3.5–5.1)
Sodium: 138 mmol/L (ref 135–145)
Total Bilirubin: 0.9 mg/dL (ref 0.3–1.2)
Total Protein: 5.5 g/dL — ABNORMAL LOW (ref 6.5–8.1)

## 2021-12-07 LAB — MAGNESIUM: Magnesium: 1.6 mg/dL — ABNORMAL LOW (ref 1.7–2.4)

## 2021-12-07 MED ORDER — MAGNESIUM SULFATE 4 GM/100ML IV SOLN
4.0000 g | Freq: Once | INTRAVENOUS | Status: AC
  Administered 2021-12-07: 4 g via INTRAVENOUS
  Filled 2021-12-07: qty 100

## 2021-12-07 NOTE — Progress Notes (Addendum)
PROGRESS NOTE        PATIENT DETAILS Name: Chad Gomez Age: 76 y.o. Sex: male Date of Birth: 1946-05-31 Admit Date: 12/03/2021 Admitting Physician Julian Hy, DO [id number], Thayer Jew, MD  Brief Summary: Patient is a 76 y.o.  male COPD, metastatic non-small cell carcinoma-s/p LUL lobectomy 2021-intolerant to chemo-currently on observation-presented to the hospital from SNF with shortness of breath-found to have acute hypoxic and hypercapnic respiratory failure due to PNA.  Admitted by PCCM to the ICU-required BiPAP-upon improvement transferred to [facility].   Significant events: 6/13>> to ED from SNF-SOB-hypoxic/hypercarbic-PNA-COPD exacerbation-started BiPAP-transfer to ICU. 6/15>> now DNR-transfer to [facility].  Significant studies: 6/13>> CTA chest: No PE-significant increased opacity in the left lung base-likely worsening PNA.  Aspirated/mucous plugging in the right lower lobe bronchi. 6/13>> Echo: EF 65-70%, RVSP 30.5  Significant microbiology data: 6/13>> COVID PCR: Negative 6/13>> urine culture: Multiple species 6/13>> blood culture: No growth 6/13>> respiratory virus panel: Rhinovirus/enterovirus  Procedures: None  Consults: PCCM Palliative care  Subjective:  Seen in bed appears to be in no discomfort denies any headache chest or abdominal pain.  Appears very frail and deconditioned.  Objective: Vitals: Blood pressure (!) 127/99, pulse 99, temperature 98.2 F (36.8 C), temperature source Oral, resp. rate 18, height 5\' 10"  (1.778 m), weight 57.2 kg, SpO2 94 %.   Exam:  Frail, cachectic, elderly Caucasian male lying in hospital bed, awake Lee confused, no focal deficits, [place].AT,PERRAL Supple Neck, No JVD,   Symmetrical Chest wall movement, Good air movement bilaterally, CTAB RRR,No Gallops, Rubs or new Murmurs,  +ve B.Sounds, Abd Soft, No tenderness,   No Cyanosis, Clubbing or edema   Assessment/Plan:  Acute hypoxic and hypercarbic  respiratory failure due to aspiration PNA +/- Rhino virus PNA and COPD exacerbation: Stable on 2-3 L of O2-is awake and alert-continue cefepime/Zithromax/steroids and bronchodilators.  Very poor prognosis-very frail/cachectic appearing-, although he is DNR family wants to pursue medical treatment, seen by palliative care.  Long-term prognosis appears to be extremely poor due to his stage IV metastatic non-small cell lung cancer, advanced age and severely cachectic state.  History of stage IV non-small cell cancer of the lung-s/p left upper lobe lobectomy 10/28/2019: He was tried on chemotherapy by Dr. Fernand Parkins was discontinued after 2 cycles due to intolerance.  Sequently developed a malignant pleural effusion-requiring a Pleurx catheter that was since removed.  Kindly see #1 above.  Septic shock: Due to PNA-resolved.  On antibiotics-culture data as above  Dysphagia in the setting of severe frailty/cachexia: Reviewed prior PCCM note-on dysphagia 3 diet with known aspiration risk.  Await goals of care discussion with family-if not transition to comfort measures-May benefit from SLP evaluation to ensure he is on a appropriate diet.    AKI: Likely hemodynamically mediated in the setting of sepsis/PNA-resolved with supportive care.  Hypoglycemia: Due to poor oral intake-continue supportive care-follow CBGs-encourage oral intake.  If hypoglycemia recurs in spite of oral intake-we can consider further work-up but given overall poor prognosis-doubt this will change management.  BPH with chronic indwelling Foley catheter: Continue Proscar-Foley remains in place.  HTN: BP stable-all antihypertensives on hold  Hypomagnesemia.  Replaced.    HLD: Statin on hold  Pressure Ulcer: Pressure Injury 12/03/21 Sacrum Upper Deep Tissue Pressure Injury - Purple or maroon localized area of discolored intact skin or blood-filled blister due to damage of underlying soft  tissue from pressure and/or shear. (Active)   12/03/21 1600  Location: Sacrum  Location Orientation: Upper  Staging: Deep Tissue Pressure Injury - Purple or maroon localized area of discolored intact skin or blood-filled blister due to damage of underlying soft tissue from pressure and/or shear.  Wound Description (Comments):   Present on Admission: Yes  Dressing Type Foam - Lift dressing to assess site every shift 12/06/21 2137    Severe protein calorie malnutrition/cachexia: Related to underlying advanced malignancy   Underweight: Estimated body mass index is 18.09 kg/m as calculated from the following:   Height as of this encounter: 5\' 10"  (1.778 m).   Weight as of this encounter: 57.2 kg.   Code status:   Code Status: DNR   DVT Prophylaxis: heparin injection 5,000 Units Start: 12/03/21 1430 SCDs Start: 12/03/21 1412   Family Communication: None at bedside-palliative care meeting with family later today.   Disposition Plan: Status is: Inpatient Remains inpatient appropriate because: Aspiration PNA-COPD-not yet stable for discharge-palliative care meeting with family for further ongoing goals of care discussion.   Planned Discharge Destination:Skilled nursing facility palliative care, family not open for hospice yet.   Diet: Diet Order             DIET DYS 3 Room service appropriate? Yes; Fluid consistency: Thin  Diet effective now                    MEDICATIONS: Scheduled Meds:  arformoterol  15 mcg Nebulization BID   chlorhexidine  15 mL Mouth Rinse BID   Chlorhexidine Gluconate Cloth  6 each Topical Q0600   finasteride  5 mg Oral Daily   heparin  5,000 Units Subcutaneous Q8H   insulin aspart  0-5 Units Subcutaneous QHS   insulin aspart  0-9 Units Subcutaneous TID WC   mouth rinse  15 mL Mouth Rinse q12n4p   methylPREDNISolone (SOLU-MEDROL) injection  40 mg Intravenous Daily   nicotine  21 mg Transdermal Daily   revefenacin  175 mcg Nebulization Daily   Continuous Infusions:  sodium chloride      azithromycin Stopped (12/06/21 1726)   ceFEPime (MAXIPIME) IV 2 g (12/07/21 0912)   PRN Meds:.acetaminophen, dextrose, docusate sodium, ipratropium-albuterol, mouth rinse, polyethylene glycol   I have personally reviewed following labs and imaging studies  LABORATORY DATA:  Recent Labs  Lab 12/03/21 1316 12/03/21 1344 12/04/21 0549 12/07/21 0115  WBC 12.0*  --  9.5 12.8*  HGB 13.1 13.3 11.4* 11.1*  HCT 42.0 39.0 34.9* 33.6*  PLT 326  --  254 320  MCV 95.7  --  91.4 90.1  MCH 29.8  --  29.8 29.8  MCHC 31.2  --  32.7 33.0  RDW 14.6  --  14.8 14.6  LYMPHSABS 1.5  --   --  0.5*  MONOABS 0.8  --   --  0.7  EOSABS 0.2  --   --  0.0  BASOSABS 0.1  --   --  0.0    Recent Labs  Lab 12/03/21 1316 12/03/21 1344 12/03/21 1559 12/03/21 1813 12/04/21 0736 12/05/21 0053 12/07/21 0115  NA 140 137  --   --  141 138 138  K 5.1 4.9  --   --  4.6 4.3 4.5  CL 104  --   --   --  109 104 108  CO2 21*  --   --   --  24 21* 19*  GLUCOSE 239*  --   --   --  88 103* 123*  BUN 49*  --   --   --  41* 35* 31*  CREATININE 1.61*  --   --   --  1.26* 1.16 1.28*  CALCIUM 10.2  --   --   --  9.3 9.4 9.6  AST 25  --   --   --   --   --  19  ALT 12  --   --   --   --   --  12  ALKPHOS 111  --   --   --   --   --  92  BILITOT 1.0  --   --   --   --   --  0.9  ALBUMIN 2.8*  --   --   --   --   --  2.4*  MG  --   --   --   --  1.4*  --  1.6*  PHOS  --   --   --   --  2.4*  --   --   LATICACIDVEN  --   --  5.5* 3.8* 3.0*  --   --   INR 1.2  --   --   --   --   --   --   BNP 57.9  --   --   --   --   --   --      RADIOLOGY STUDIES/RESULTS: [place] EKG Site Rite  Result Date: 12/06/2021 If Site Rite image not attached, placement could not be confirmed due to current cardiac rhythm.  DG Chest Port 1 View  Result Date: 12/06/2021 CLINICAL DATA:  Nausea shortness of breath EXAM: PORTABLE CHEST 1 VIEW COMPARISON:  Chest radiograph and CTA chest 12/03/2021 FINDINGS: The cardiomediastinal silhouette  is stable, with unchanged calcified atherosclerotic plaque of the aortic arch Patchy and confluent opacities are again seen throughout the left lower lung with blunting of the left costophrenic angle likely reflecting layering pleural fluid. Compared to the study from 12/03/2021, aeration is stable to slightly improved. The right lung remains clear. There is no significant right effusion. There is no pneumothorax. The imaged bones are unremarkable. IMPRESSION: Persistent opacities projecting over the left lower lung with a left pleural effusion, overall stable to slightly improved since 12/03/2021. Electronically Signed   By: Valetta Mole M.D.   On: 12/06/2021 08:08   DG Abd Portable 1V  Result Date: 12/06/2021 CLINICAL DATA:  Nausea. EXAM: PORTABLE ABDOMEN - 1 VIEW COMPARISON:  None Available. FINDINGS: Gas is present in the colon and rectum. The cecum/ascending colon is moderately distended by stool and gas. The remainder of the colon is normal in caliber. Scattered gas is present in nondilated loops of small bowel. Atherosclerotic vascular calcifications are noted. The lungs are reported separately. IMPRESSION: Moderately distended cecum/ascending colon by stool and gas. No evidence of bowel obstruction. Electronically Signed   By: Logan Bores M.D.   On: 12/06/2021 08:07     LOS: 4 days   Signature  Lala Lund M.D on 12/07/2021 at 9:40 AM   -  To page go to [email or web address]

## 2021-12-07 NOTE — Evaluation (Signed)
Occupational Therapy Evaluation Patient Details Name: Chad Gomez MRN: [id number] DOB: 1945-09-26 Today's Date: 12/07/2021   History of Present Illness pt is a 76 y/o male admitted 6/13 from SNF with SOB, found to have acute hypoxic and hypercapnic respiratory failure due to PNA.  PMHx Stage 4 NSCLC, s/p L UL lobectomy 2921, COPD, DM, HTN, PVD   Clinical Impression   Per chart review and some pt report, pt uses SPC at baseline for mobility, and receives assistance for ADLs, however is unable to further elaborate. Pt oriented to self, and able to identify he is at the hospital when given 3 options. Pt perseverative stating "June 2nd" and "occupational therapy" when given simple commands. Session limited due to incontinence, pt able to roll R/L with min guard for bed/pad change, declines sitting EOB states he "will fall" despite therapist support/encouragement. Pt presenting with impairments listed below, will follow acutely. Recommend SNF at d/c.     Recommendations for follow up therapy are one component of a multi-disciplinary discharge planning process, led by the attending physician.  Recommendations may be updated based on patient status, additional functional criteria and insurance authorization.   Follow Up Recommendations  Skilled nursing-short term rehab (<3 hours/day)    Assistance Recommended at Discharge Frequent or constant Supervision/Assistance  Patient can return home with the following A lot of help with walking and/or transfers;A lot of help with bathing/dressing/bathroom;Assistance with cooking/housework;Direct supervision/assist for financial management;Direct supervision/assist for medications management;Assist for transportation;Help with stairs or ramp for entrance    Functional Status Assessment  Patient has had a recent decline in their functional status and demonstrates the ability to make significant improvements in function in a reasonable and predictable amount  of time.  Equipment Recommendations  None recommended by OT;Other (comment) (defer to next venue of care)    Recommendations for Other Services PT consult     Precautions / Restrictions Precautions Precautions: Fall Restrictions Weight Bearing Restrictions: No      Mobility Bed Mobility Overal bed mobility: Needs Assistance Bed Mobility: Rolling Rolling: Min guard         General bed mobility comments: rolling R/L multiple times for bed change    Transfers                   General transfer comment: deferred      Balance                                           ADL either performed or assessed with clinical judgement   ADL Overall ADL's : Needs assistance/impaired Eating/Feeding: Set up;Sitting Eating/Feeding Details (indicate cue type and reason): drinking water Grooming: Set up;Sitting           Upper Body Dressing : Moderate assistance;Bed level Upper Body Dressing Details (indicate cue type and reason): to don clean gown Lower Body Dressing: Maximal assistance;Bed level Lower Body Dressing Details (indicate cue type and reason): to don socks                     Vision   Vision Assessment?: No apparent visual deficits     Perception     Praxis      Pertinent Vitals/Pain Pain Assessment Pain Assessment: No/denies pain     Hand Dominance Right   Extremity/Trunk Assessment Upper Extremity Assessment Upper Extremity Assessment: Generalized weakness   Lower Extremity  Assessment Lower Extremity Assessment: Defer to PT evaluation   Cervical / Trunk Assessment Cervical / Trunk Assessment: Kyphotic   Communication     Cognition Arousal/Alertness: Awake/alert Behavior During Therapy: Flat affect Overall Cognitive Status: No family/caregiver present to determine baseline cognitive functioning                                 General Comments: able to identify he is at the hospital when given 3  options; perseverative on it being the "second" and continues to verbalize "occupational therapy" during session     General Comments  VSS on RA    Exercises     Shoulder Instructions      Home Living Family/patient expects to be discharged to:: Skilled nursing facility                                 Additional Comments: pt unable to provide      Prior Functioning/Environment Prior Level of Function : Patient poor historian/Family not available             Mobility Comments: Uses SPC during all mobility offload ADLs Comments: receives assistance however unable to elaborate        OT Problem List:        OT Treatment/Interventions: Self-care/ADL training;Therapeutic exercise;Energy conservation;Cognitive remediation/compensation;Therapeutic activities;Patient/family education;Balance training    OT Goals(Current goals can be found in the care plan section) Acute Rehab OT Goals Patient Stated Goal: none stated OT Goal Formulation: With patient Time For Goal Achievement: 01/11/2022 Potential to Achieve Goals: Fair ADL Goals Pt Will Perform Upper Body Dressing: with min assist;sitting Pt Will Perform Lower Body Dressing: with mod assist;sitting/lateral leans;sit to/from stand Pt Will Transfer to Toilet: squat pivot transfer;stand pivot transfer;bedside commode;with min assist Additional ADL Goal #1: pt will complete bed mobility supervision in prep for ADLs  OT Frequency: Min 2X/week    Co-evaluation              AM-PAC OT "6 Clicks" Daily Activity     Outcome Measure Help from another person eating meals?: A Little Help from another person taking care of personal grooming?: A Little Help from another person toileting, which includes using toliet, bedpan, or urinal?: Total Help from another person bathing (including washing, rinsing, drying)?: A Lot Help from another person to put on and taking off regular upper body clothing?: A Lot Help from  another person to put on and taking off regular lower body clothing?: A Lot 6 Click Score: 13   End of Session Nurse Communication: Mobility status  Activity Tolerance: Patient tolerated treatment well Patient left: in bed;with bed alarm set;with call bell/phone within reach;with restraints reapplied  OT Visit Diagnosis: Muscle weakness (generalized) (M62.81);Other symptoms and signs involving cognitive function                Time: 4580-9983 OT Time Calculation (min): 30 min Charges:  OT General Charges $OT Visit: 1 Visit OT Evaluation $OT Eval Low Complexity: 1 Low OT Treatments $Self Care/Home Management : 8-22 mins  Lynnda Child, OTD, OTR/L Acute Rehab [phone] - [place] 12/07/2021, 10:04 AM

## 2021-12-08 ENCOUNTER — Inpatient Hospital Stay (HOSPITAL_COMMUNITY): Payer: Medicare Other

## 2021-12-08 DIAGNOSIS — R0902 Hypoxemia: Secondary | ICD-10-CM | POA: Diagnosis not present

## 2021-12-08 LAB — CBC WITH DIFFERENTIAL/PLATELET
Abs Immature Granulocytes: 0.12 10*3/uL — ABNORMAL HIGH (ref 0.00–0.07)
Basophils Absolute: 0 10*3/uL (ref 0.0–0.1)
Basophils Relative: 0 %
Eosinophils Absolute: 0 10*3/uL (ref 0.0–0.5)
Eosinophils Relative: 0 %
HCT: 34.8 % — ABNORMAL LOW (ref 39.0–52.0)
Hemoglobin: 11.3 g/dL — ABNORMAL LOW (ref 13.0–17.0)
Immature Granulocytes: 1 %
Lymphocytes Relative: 5 %
Lymphs Abs: 0.9 10*3/uL (ref 0.7–4.0)
MCH: 30.1 pg (ref 26.0–34.0)
MCHC: 32.5 g/dL (ref 30.0–36.0)
MCV: 92.6 fL (ref 80.0–100.0)
Monocytes Absolute: 1.5 10*3/uL — ABNORMAL HIGH (ref 0.1–1.0)
Monocytes Relative: 9 %
Neutro Abs: 14 10*3/uL — ABNORMAL HIGH (ref 1.7–7.7)
Neutrophils Relative %: 85 %
Platelets: 308 10*3/uL (ref 150–400)
RBC: 3.76 MIL/uL — ABNORMAL LOW (ref 4.22–5.81)
RDW: 15.2 % (ref 11.5–15.5)
WBC: 16.5 10*3/uL — ABNORMAL HIGH (ref 4.0–10.5)
nRBC: 0 % (ref 0.0–0.2)

## 2021-12-08 LAB — COMPREHENSIVE METABOLIC PANEL
ALT: 12 U/L (ref 0–44)
AST: 24 U/L (ref 15–41)
Albumin: 2.2 g/dL — ABNORMAL LOW (ref 3.5–5.0)
Alkaline Phosphatase: 79 U/L (ref 38–126)
Anion gap: 12 (ref 5–15)
BUN: 36 mg/dL — ABNORMAL HIGH (ref 8–23)
CO2: 19 mmol/L — ABNORMAL LOW (ref 22–32)
Calcium: 9.5 mg/dL (ref 8.9–10.3)
Chloride: 111 mmol/L (ref 98–111)
Creatinine, Ser: 1.25 mg/dL — ABNORMAL HIGH (ref 0.61–1.24)
GFR, Estimated: >60 mL/min (ref >60)
Glucose, Bld: 84 mg/dL (ref 70–99)
Potassium: 3.5 mmol/L (ref 3.5–5.1)
Sodium: 142 mmol/L (ref 135–145)
Total Bilirubin: 0.9 mg/dL (ref 0.3–1.2)
Total Protein: 5.1 g/dL — ABNORMAL LOW (ref 6.5–8.1)

## 2021-12-08 LAB — CULTURE, BLOOD (ROUTINE X 2): Culture: NO GROWTH

## 2021-12-08 LAB — GLUCOSE, CAPILLARY
Glucose-Capillary: 74 mg/dL (ref 70–99)
Glucose-Capillary: 120 mg/dL — ABNORMAL HIGH (ref 70–99)
Glucose-Capillary: 144 mg/dL — ABNORMAL HIGH (ref 70–99)
Glucose-Capillary: 150 mg/dL — ABNORMAL HIGH (ref 70–99)

## 2021-12-08 LAB — PROCALCITONIN: Procalcitonin: 0.13 ng/mL

## 2021-12-08 LAB — MAGNESIUM: Magnesium: 2.5 mg/dL — ABNORMAL HIGH (ref 1.7–2.4)

## 2021-12-08 MED ORDER — POTASSIUM CHLORIDE 20 MEQ PO PACK
40.0000 meq | PACK | Freq: Once | ORAL | Status: AC
  Administered 2021-12-08: 40 meq via ORAL
  Filled 2021-12-08: qty 2

## 2021-12-08 MED ORDER — AMOXICILLIN-POT CLAVULANATE 875-125 MG PO TABS
1.0000 | ORAL_TABLET | Freq: Two times a day (BID) | ORAL | Status: DC
  Administered 2021-12-08 – 2021-12-10 (×4): 1 via ORAL
  Filled 2021-12-08 (×5): qty 1

## 2021-12-08 NOTE — Progress Notes (Addendum)
PROGRESS NOTE        PATIENT DETAILS Name: Chad Gomez Age: 76 y.o. Sex: male Date of Birth: July 14, 1945 Admit Date: 12/03/2021 Admitting Physician Julian Hy, DO VFI:EPPIR, Thayer Jew, MD  Brief Summary: Patient is a 76 y.o.  male COPD, metastatic non-small cell carcinoma-s/p LUL lobectomy 2021-intolerant to chemo-currently on observation-presented to the hospital from SNF with shortness of breath-found to have acute hypoxic and hypercapnic respiratory failure due to PNA.  Admitted by PCCM to the ICU-required BiPAP-upon improvement transferred to [facility].   Significant events: 6/13>> to ED from SNF-SOB-hypoxic/hypercarbic-PNA-COPD exacerbation-started BiPAP-transfer to ICU. 6/15>> now DNR-transfer to [facility].  Significant studies: 6/13>> CTA chest: No PE-significant increased opacity in the left lung base-likely worsening PNA.  Aspirated/mucous plugging in the right lower lobe bronchi. 6/13>> Echo: EF 65-70%, RVSP 30.5  Significant microbiology data: 6/13>> COVID PCR: Negative 6/13>> urine culture: Multiple species 6/13>> blood culture: No growth 6/13>> respiratory virus panel: Rhinovirus/enterovirus  Procedures: None  Consults: PCCM Palliative care  Subjective:  in bed in no distress, denies any headache, no chest - abd pain, no SOB.  Objective: Vitals: Blood pressure 115/82, pulse (!) 102, temperature 97.6 F (36.4 C), resp. rate 17, height 5\' 10"  (1.778 m), weight 57.4 kg, SpO2 100 %.   Exam:  Frail, cachectic, elderly Caucasian male lying in hospital bed, awake Lee confused, no focal deficits, [place].AT,PERRAL Supple Neck, No JVD,   Symmetrical Chest wall movement, Good air movement bilaterally, CTAB RRR,No Gallops, Rubs or new Murmurs,  +ve B.Sounds, Abd Soft, No tenderness,   No Cyanosis, Clubbing or edema   Assessment/Plan:  Acute hypoxic and hypercarbic respiratory failure due to aspiration PNA +/- Rhino virus PNA and COPD exacerbation:  Stable on 2-3 L of O2-is awake and alert-continue cefepime/Zithromax/steroids and bronchodilators.  Very poor prognosis-very frail/cachectic appearing-, although he is DNR family wants to pursue medical treatment, seen by palliative care.  Long-term prognosis appears to be extremely poor due to his stage IV metastatic non-small cell lung cancer, advanced age and severely cachectic state.  History of stage IV non-small cell cancer of the lung-s/p left upper lobe lobectomy 10/28/2019: He was tried on chemotherapy by Dr. Fernand Parkins was discontinued after 2 cycles due to intolerance.  Sequently developed a malignant pleural effusion-requiring a Pleurx catheter that was since removed.  Kindly see #1 above.  Septic shock: Due to PNA-resolved.  On antibiotics-culture data as above  Dysphagia in the setting of severe frailty/cachexia: Reviewed prior PCCM note-on dysphagia 3 diet with known aspiration risk.  Await goals of care discussion with family-if not transition to comfort measures-May benefit from SLP evaluation to ensure he is on a appropriate diet.  Chances of recurrent aspiration remains very high.  AKI: Likely hemodynamically mediated in the setting of sepsis/PNA-resolved with supportive care.  Hypoglycemia: Due to poor oral intake-continue supportive care-follow CBGs-encourage oral intake.  If hypoglycemia recurs in spite of oral intake-we can consider further work-up but given overall poor prognosis-doubt this will change management.  BPH with chronic indwelling Foley catheter: Continue Proscar-Foley remains in place.  HTN: BP stable-all antihypertensives on hold  Hypomagnesemia.  Replaced.    HLD: Statin on hold  Pressure Ulcer: Pressure Injury 12/03/21 Sacrum Upper Deep Tissue Pressure Injury - Purple or maroon localized area of discolored intact skin or blood-filled blister due to damage of underlying soft tissue from pressure and/or  shear. (Active)  12/03/21 1600  Location: Sacrum   Location Orientation: Upper  Staging: Deep Tissue Pressure Injury - Purple or maroon localized area of discolored intact skin or blood-filled blister due to damage of underlying soft tissue from pressure and/or shear.  Wound Description (Comments):   Present on Admission: Yes  Dressing Type Foam - Lift dressing to assess site every shift 12/07/21 2000    Severe protein calorie malnutrition/cachexia: Related to underlying advanced malignancy   Underweight: Estimated body mass index is 18.16 kg/m as calculated from the following:   Height as of this encounter: 5\' 10"  (1.778 m).   Weight as of this encounter: 57.4 kg.   Code status:   Code Status: DNR   DVT Prophylaxis: heparin injection 5,000 Units Start: 12/03/21 1430 SCDs Start: 12/03/21 1412   Family Communication: None at bedside-palliative care meeting with family later today.   Disposition Plan: Status is: Inpatient Remains inpatient appropriate because: Aspiration PNA-COPD-not yet stable for discharge-palliative care meeting with family for further ongoing goals of care discussion.   Planned Discharge Destination:Skilled nursing facility palliative care, family not open for hospice yet.   Diet: Diet Order             DIET DYS 3 Room service appropriate? Yes; Fluid consistency: Thin  Diet effective now                    MEDICATIONS: Scheduled Meds:  amoxicillin-clavulanate  1 tablet Oral Q12H   arformoterol  15 mcg Nebulization BID   chlorhexidine  15 mL Mouth Rinse BID   Chlorhexidine Gluconate Cloth  6 each Topical Q0600   finasteride  5 mg Oral Daily   heparin  5,000 Units Subcutaneous Q8H   insulin aspart  0-5 Units Subcutaneous QHS   insulin aspart  0-9 Units Subcutaneous TID WC   mouth rinse  15 mL Mouth Rinse q12n4p   methylPREDNISolone (SOLU-MEDROL) injection  40 mg Intravenous Daily   nicotine  21 mg Transdermal Daily   potassium chloride  40 mEq Oral Once   revefenacin  175 mcg Nebulization  Daily   Continuous Infusions:  sodium chloride     PRN Meds:.acetaminophen, dextrose, docusate sodium, ipratropium-albuterol, mouth rinse, polyethylene glycol   I have personally reviewed following labs and imaging studies  LABORATORY DATA:  Recent Labs  Lab 12/03/21 1316 12/03/21 1344 12/04/21 0549 12/07/21 0115 12/08/21 0241  WBC 12.0*  --  9.5 12.8* 16.5*  HGB 13.1 13.3 11.4* 11.1* 11.3*  HCT 42.0 39.0 34.9* 33.6* 34.8*  PLT 326  --  254 320 308  MCV 95.7  --  91.4 90.1 92.6  MCH 29.8  --  29.8 29.8 30.1  MCHC 31.2  --  32.7 33.0 32.5  RDW 14.6  --  14.8 14.6 15.2  LYMPHSABS 1.5  --   --  0.5* 0.9  MONOABS 0.8  --   --  0.7 1.5*  EOSABS 0.2  --   --  0.0 0.0  BASOSABS 0.1  --   --  0.0 0.0    Recent Labs  Lab 12/03/21 1316 12/03/21 1344 12/03/21 1559 12/03/21 1813 12/04/21 0736 12/05/21 0053 12/07/21 0115 12/08/21 0241  NA 140 137  --   --  141 138 138 142  K 5.1 4.9  --   --  4.6 4.3 4.5 3.5  CL 104  --   --   --  109 104 108 111  CO2 21*  --   --   --  24 21* 19* 19*  GLUCOSE 239*  --   --   --  88 103* 123* 84  BUN 49*  --   --   --  41* 35* 31* 36*  CREATININE 1.61*  --   --   --  1.26* 1.16 1.28* 1.25*  CALCIUM 10.2  --   --   --  9.3 9.4 9.6 9.5  AST 25  --   --   --   --   --  19 24  ALT 12  --   --   --   --   --  12 12  ALKPHOS 111  --   --   --   --   --  92 79  BILITOT 1.0  --   --   --   --   --  0.9 0.9  ALBUMIN 2.8*  --   --   --   --   --  2.4* 2.2*  MG  --   --   --   --  1.4*  --  1.6* 2.5*  PHOS  --   --   --   --  2.4*  --   --   --   LATICACIDVEN  --   --  5.5* 3.8* 3.0*  --   --   --   INR 1.2  --   --   --   --   --   --   --   BNP 57.9  --   --   --   --   --   --   --      RADIOLOGY STUDIES/RESULTS: [place] EKG Site Rite  Result Date: 12/06/2021 If Site Rite image not attached, placement could not be confirmed due to current cardiac rhythm.    LOS: 5 days   Signature  Lala Lund M.D on 12/08/2021 at 8:55 AM   -  To page  go to [email or web address]

## 2021-12-08 NOTE — Progress Notes (Signed)
RT called to deep suction patient. RN and family at bedside. RT attempted to NTS patient. Patient and family refusing. Patient has weak, congested cough. RT tried to deep suction throat with yankauer with minimal secretions.

## 2021-12-09 ENCOUNTER — Inpatient Hospital Stay (HOSPITAL_COMMUNITY): Payer: Medicare Other

## 2021-12-09 DIAGNOSIS — R0902 Hypoxemia: Secondary | ICD-10-CM | POA: Diagnosis not present

## 2021-12-09 LAB — GLUCOSE, CAPILLARY
Glucose-Capillary: 108 mg/dL — ABNORMAL HIGH (ref 70–99)
Glucose-Capillary: 146 mg/dL — ABNORMAL HIGH (ref 70–99)
Glucose-Capillary: 134 mg/dL — ABNORMAL HIGH (ref 70–99)
Glucose-Capillary: 125 mg/dL — ABNORMAL HIGH (ref 70–99)

## 2021-12-09 LAB — COMPREHENSIVE METABOLIC PANEL
ALT: 6 U/L (ref 0–44)
AST: 35 U/L (ref 15–41)
Albumin: 2.2 g/dL — ABNORMAL LOW (ref 3.5–5.0)
Alkaline Phosphatase: 78 U/L (ref 38–126)
Anion gap: 13 (ref 5–15)
BUN: 45 mg/dL — ABNORMAL HIGH (ref 8–23)
CO2: 17 mmol/L — ABNORMAL LOW (ref 22–32)
Calcium: 9.9 mg/dL (ref 8.9–10.3)
Chloride: 113 mmol/L — ABNORMAL HIGH (ref 98–111)
Creatinine, Ser: 1.28 mg/dL — ABNORMAL HIGH (ref 0.61–1.24)
GFR, Estimated: 59 mL/min — ABNORMAL LOW (ref >60)
Glucose, Bld: 110 mg/dL — ABNORMAL HIGH (ref 70–99)
Potassium: 4.5 mmol/L (ref 3.5–5.1)
Sodium: 143 mmol/L (ref 135–145)
Total Bilirubin: 1.8 mg/dL — ABNORMAL HIGH (ref 0.3–1.2)
Total Protein: 5.1 g/dL — ABNORMAL LOW (ref 6.5–8.1)

## 2021-12-09 LAB — CBC WITH DIFFERENTIAL/PLATELET
Abs Immature Granulocytes: 0.1 10*3/uL — ABNORMAL HIGH (ref 0.00–0.07)
Basophils Absolute: 0 10*3/uL (ref 0.0–0.1)
Basophils Relative: 0 %
Eosinophils Absolute: 0 10*3/uL (ref 0.0–0.5)
Eosinophils Relative: 0 %
HCT: 36.2 % — ABNORMAL LOW (ref 39.0–52.0)
Hemoglobin: 11.8 g/dL — ABNORMAL LOW (ref 13.0–17.0)
Immature Granulocytes: 1 %
Lymphocytes Relative: 6 %
Lymphs Abs: 0.9 10*3/uL (ref 0.7–4.0)
MCH: 29.9 pg (ref 26.0–34.0)
MCHC: 32.6 g/dL (ref 30.0–36.0)
MCV: 91.9 fL (ref 80.0–100.0)
Monocytes Absolute: 1.2 10*3/uL — ABNORMAL HIGH (ref 0.1–1.0)
Monocytes Relative: 8 %
Neutro Abs: 13.1 10*3/uL — ABNORMAL HIGH (ref 1.7–7.7)
Neutrophils Relative %: 85 %
Platelets: 338 10*3/uL (ref 150–400)
RBC: 3.94 MIL/uL — ABNORMAL LOW (ref 4.22–5.81)
RDW: 15.4 % (ref 11.5–15.5)
WBC: 15.3 10*3/uL — ABNORMAL HIGH (ref 4.0–10.5)
nRBC: 0 % (ref 0.0–0.2)

## 2021-12-09 LAB — BRAIN NATRIURETIC PEPTIDE: B Natriuretic Peptide: 880.6 pg/mL — ABNORMAL HIGH (ref 0.0–100.0)

## 2021-12-09 LAB — MAGNESIUM: Magnesium: 2.4 mg/dL (ref 1.7–2.4)

## 2021-12-09 LAB — PROCALCITONIN: Procalcitonin: 0.16 ng/mL

## 2021-12-09 MED ORDER — SCOPOLAMINE 1 MG/3DAYS TD PT72
1.0000 | MEDICATED_PATCH | TRANSDERMAL | Status: DC
  Administered 2021-12-09: 1.5 mg via TRANSDERMAL
  Filled 2021-12-09: qty 1

## 2021-12-09 MED ORDER — LIDOCAINE HCL URETHRAL/MUCOSAL 2 % EX GEL
1.0000 | Freq: Once | CUTANEOUS | Status: AC
Start: 2021-12-09 — End: 2021-12-09
  Administered 2021-12-09: 1 via URETHRAL
  Filled 2021-12-09 (×2): qty 6

## 2021-12-09 MED ORDER — LIDOCAINE-EPINEPHRINE-TETRACAINE (LET) TOPICAL GEL
3.0000 mL | Freq: Once | TOPICAL | Status: DC
Start: 2021-12-09 — End: 2021-12-09

## 2021-12-09 MED ORDER — FUROSEMIDE 10 MG/ML IJ SOLN
40.0000 mg | Freq: Once | INTRAMUSCULAR | Status: AC
  Administered 2021-12-09: 40 mg via INTRAVENOUS
  Filled 2021-12-09: qty 4

## 2021-12-09 NOTE — Progress Notes (Signed)
Patient refusing CPT at this time. Patient complains of left side hurting. RT will try again next round.

## 2021-12-09 NOTE — Progress Notes (Signed)
PROGRESS NOTE        PATIENT DETAILS Name: Chad Gomez Age: 76 y.o. Sex: male Date of Birth: 1946-04-13 Admit Date: 12/03/2021 Admitting Physician Julian Hy, DO HWE:[id number], Thayer Jew, MD  Brief Summary: Patient is a 76 y.o.  male COPD, metastatic non-small cell carcinoma-s/p LUL lobectomy 2021-intolerant to chemo-currently on observation-presented to the hospital from SNF with shortness of breath-found to have acute hypoxic and hypercapnic respiratory failure due to PNA.  Admitted by PCCM to the ICU-required BiPAP-upon improvement transferred to [facility].   Significant events: 6/13>> to ED from SNF-SOB-hypoxic/hypercarbic-PNA-COPD exacerbation-started BiPAP-transfer to ICU. 6/15>> now DNR-transfer to [facility]  Significant studies: 6/13>> CTA chest: No PE-significant increased opacity in the left lung base-likely worsening PNA.  Aspirated/mucous plugging in the right lower lobe bronchi. 6/13>> Echo: EF 65-70%, RVSP 30.5  Significant microbiology data: 6/13>> COVID PCR: Negative 6/13>> urine culture: Multiple species 6/13>> blood culture: No growth 6/13>> respiratory virus panel: Rhinovirus/enterovirus  Procedures: None  Consults: PCCM Palliative care  Subjective: Patient in bed, in no distress but has audible gurgling of upper airway, denies any headache, says he is mildly short of breath, no chest or abdominal pain.  Objective: Vitals: Blood pressure 127/80, pulse (!) 103, temperature 97.6 F (36.4 C), temperature source Oral, resp. rate 20, height 5\' 10"  (1.778 m), weight 57.6 kg, SpO2 92 %.   Exam:  Frail, cachectic, elderly Caucasian male lying in hospital bed, awake Lee confused, no focal deficits, Roe.AT,PERRAL Supple Neck, No JVD,   Symmetrical Chest wall movement, Good air movement bilaterally, coarse B sounds RRR,No Gallops, Rubs or new Murmurs,  +ve B.Sounds, Abd Soft, No tenderness,   No Cyanosis, Clubbing or edema     Assessment/Plan:  Acute hypoxic and hypercarbic respiratory failure due to aspiration PNA +/- Rhino virus PNA and COPD exacerbation: Stable on 2-3 L of O2-is awake and alert-continue cefepime/Zithromax/steroids and bronchodilators.  Very poor prognosis-very frail/cachectic appearing-, although he is DNR family wants to pursue medical treatment, seen by palliative care.  Long-term prognosis appears to be extremely poor due to his stage IV metastatic non-small cell lung cancer, advanced age and severely cachectic state.  Explained the situation to patient's wife in detail on 12/08/2021, she is leaning towards hospice but has pressure from other family members.  Message left again for her on her cell phone on 12/09/2021, patient's prognosis extremely poor.  Continue supportive care for now.  History of stage IV non-small cell cancer of the lung-s/p left upper lobe lobectomy 10/28/2019: He was tried on chemotherapy by Dr. Fernand Parkins was discontinued after 2 cycles due to intolerance.  Sequently developed a malignant pleural effusion-requiring a Pleurx catheter that was since removed.  Kindly see #1 above.  Septic shock: Due to PNA-resolved.  On antibiotics-culture data as above  Dysphagia in the setting of severe frailty/cachexia: Reviewed prior PCCM note-on dysphagia 3 diet with known aspiration risk.  Await goals of care discussion with family-if not transition to comfort measures-May benefit from SLP evaluation to ensure he is on a appropriate diet.  Chances of recurrent aspiration remains very high.  AKI: Likely hemodynamically mediated in the setting of sepsis/PNA-resolved with supportive care.  Hypoglycemia: Due to poor oral intake-continue supportive care-follow CBGs-encourage oral intake.  If hypoglycemia recurs in spite of oral intake-we can consider further work-up but given overall poor prognosis-doubt this will change management.  BPH  with chronic indwelling Foley catheter: Continue  Proscar-Foley remains in place.  HTN: BP stable-all antihypertensives on hold  Hypomagnesemia.  Replaced.    HLD: Statin on hold  Pressure Ulcer: Pressure Injury 12/03/21 Sacrum Upper Deep Tissue Pressure Injury - Purple or maroon localized area of discolored intact skin or blood-filled blister due to damage of underlying soft tissue from pressure and/or shear. (Active)  12/03/21 1600  Location: Sacrum  Location Orientation: Upper  Staging: Deep Tissue Pressure Injury - Purple or maroon localized area of discolored intact skin or blood-filled blister due to damage of underlying soft tissue from pressure and/or shear.  Wound Description (Comments):   Present on Admission: Yes  Dressing Type Foam - Lift dressing to assess site every shift 12/09/21 0850    Severe protein calorie malnutrition/cachexia: Related to underlying advanced malignancy   Underweight: Estimated body mass index is 18.22 kg/m as calculated from the following:   Height as of this encounter: 5\' 10"  (1.778 m).   Weight as of this encounter: 57.6 kg.   Code status:   Code Status: DNR   DVT Prophylaxis: heparin injection 5,000 Units Start: 12/03/21 1430 SCDs Start: 12/03/21 1412   Family Communication: Wife updated over the hospital phoneon 12/08/2021, message left on her cell [phone] on 12/09/2021 at 10:25 AM   Disposition Plan: Status is: Inpatient Remains inpatient appropriate because: Aspiration PNA-COPD-not yet stable for discharge-palliative care meeting with family for further ongoing goals of care discussion.   Planned Discharge Destination:Skilled nursing facility palliative care, family not open for hospice yet.   Diet: Diet Order             DIET DYS 3 Room service appropriate? Yes; Fluid consistency: Thin  Diet effective now                    MEDICATIONS: Scheduled Meds:  amoxicillin-clavulanate  1 tablet Oral Q12H   arformoterol  15 mcg Nebulization BID   chlorhexidine  15 mL  Mouth Rinse BID   Chlorhexidine Gluconate Cloth  6 each Topical Q0600   finasteride  5 mg Oral Daily   heparin  5,000 Units Subcutaneous Q8H   insulin aspart  0-5 Units Subcutaneous QHS   insulin aspart  0-9 Units Subcutaneous TID WC   mouth rinse  15 mL Mouth Rinse q12n4p   methylPREDNISolone (SOLU-MEDROL) injection  40 mg Intravenous Daily   nicotine  21 mg Transdermal Daily   revefenacin  175 mcg Nebulization Daily   scopolamine  1 patch Transdermal Q72H   Continuous Infusions:  sodium chloride     PRN Meds:.acetaminophen, dextrose, docusate sodium, ipratropium-albuterol, mouth rinse, polyethylene glycol   I have personally reviewed following labs and imaging studies  LABORATORY DATA:  Recent Labs  Lab 12/03/21 1316 12/03/21 1344 12/04/21 0549 12/07/21 0115 12/08/21 0241 12/09/21 0648  WBC 12.0*  --  9.5 12.8* 16.5* 15.3*  HGB 13.1 13.3 11.4* 11.1* 11.3* 11.8*  HCT 42.0 39.0 34.9* 33.6* 34.8* 36.2*  PLT 326  --  254 320 308 338  MCV 95.7  --  91.4 90.1 92.6 91.9  MCH 29.8  --  29.8 29.8 30.1 29.9  MCHC 31.2  --  32.7 33.0 32.5 32.6  RDW 14.6  --  14.8 14.6 15.2 15.4  LYMPHSABS 1.5  --   --  0.5* 0.9 0.9  MONOABS 0.8  --   --  0.7 1.5* 1.2*  EOSABS 0.2  --   --  0.0 0.0 0.0  BASOSABS 0.1  --   --  0.0 0.0 0.0    Recent Labs  Lab 12/03/21 1316 12/03/21 1344 12/03/21 1559 12/03/21 1813 12/04/21 0736 12/05/21 0053 12/07/21 0115 12/08/21 0241 12/08/21 1825 12/09/21 0537 12/09/21 0648  NA 140   < >  --   --  141 138 138 142  --  143  --   K 5.1   < >  --   --  4.6 4.3 4.5 3.5  --  4.5  --   CL 104  --   --   --  109 104 108 111  --  113*  --   CO2 21*  --   --   --  24 21* 19* 19*  --  17*  --   GLUCOSE 239*  --   --   --  88 103* 123* 84  --  110*  --   BUN 49*  --   --   --  41* 35* 31* 36*  --  45*  --   CREATININE 1.61*  --   --   --  1.26* 1.16 1.28* 1.25*  --  1.28*  --   CALCIUM 10.2  --   --   --  9.3 9.4 9.6 9.5  --  9.9  --   AST 25  --   --    --   --   --  19 24  --  35  --   ALT 12  --   --   --   --   --  12 12  --  6  --   ALKPHOS 111  --   --   --   --   --  92 79  --  78  --   BILITOT 1.0  --   --   --   --   --  0.9 0.9  --  1.8*  --   ALBUMIN 2.8*  --   --   --   --   --  2.4* 2.2*  --  2.2*  --   MG  --   --   --   --  1.4*  --  1.6* 2.5*  --  2.4  --   PHOS  --   --   --   --  2.4*  --   --   --   --   --   --   PROCALCITON  --   --   --   --   --   --   --   --  0.13  --  0.16  LATICACIDVEN  --   --  5.5* 3.8* 3.0*  --   --   --   --   --   --   INR 1.2  --   --   --   --   --   --   --   --   --   --   BNP 57.9  --   --   --   --   --   --   --   --   --   --    < > = values in this interval not displayed.     RADIOLOGY STUDIES/RESULTS: DG Chest Port 1 View  Result Date: 12/09/2021 CLINICAL DATA:  Shortness of breath, hypoxia, history of lung cancer involving the lingula, COPD, type II diabetes mellitus, hypertension EXAM: PORTABLE CHEST 1 VIEW COMPARISON:  Portable exam 0822 hours compared to 12/08/2021  FINDINGS: Normal heart size and pulmonary vascularity. Atherosclerotic calcifications aorta. Chronic volume loss of the LEFT hemithorax with mediastinal shift to LEFT. Underlying emphysematous changes RIGHT lung. Chronic opacities in LEFT lung consistent with scarring and postsurgical changes. Associated chronic LEFT pleural effusion. RIGHT lung clear without evidence of pneumothorax. Osseous demineralization. IMPRESSION: Persistent LEFT lung opacities and LEFT pleural effusion COPD changes without acute RIGHT lung abnormalities. Aortic Atherosclerosis (ICD10-I70.0) and Emphysema (ICD10-J43.9). Electronically Signed   By: Lavonia Dana M.D.   On: 12/09/2021 08:30   DG Chest Port 1 View  Result Date: 12/08/2021 CLINICAL DATA:  76 year old male with shortness of breath. History of lung cancer, abnormal left lung. EXAM: PORTABLE CHEST 1 VIEW COMPARISON:  Portable chest 12/06/2021 and earlier. FINDINGS: Portable AP semi upright  view at 0827 hours. Ongoing volume loss, Patchy and confluent left lung opacity which has not significantly improved from April this year. The left lung findings have progressed since late last year. Underlying emphysema demonstrated on CT. No pneumothorax. Left pleural effusion is chronic and partially loculated. Right lung appears stable. Calcified aortic atherosclerosis. Stable cardiac size and mediastinal contours. Stable visualized osseous structures. IMPRESSION: 1. Acute opacity superimposed on chronically abnormal left lung. Ventilation has not improved since April this year. Appearance may represent a combination of infection and progressive tumor. 2. Underlying emphysema.  No new cardiopulmonary abnormality. Electronically Signed   By: Genevie Ann M.D.   On: 12/08/2021 08:59     LOS: 6 days   Signature  Lala Lund M.D on 12/09/2021 at 10:23 AM   -  To page go to [email or web address]

## 2021-12-09 NOTE — Progress Notes (Signed)
Patient refused all PO meds during my shift 7p-7a

## 2021-12-10 DIAGNOSIS — R0902 Hypoxemia: Secondary | ICD-10-CM | POA: Diagnosis not present

## 2021-12-10 LAB — COMPREHENSIVE METABOLIC PANEL
ALT: 14 U/L (ref 0–44)
AST: 20 U/L (ref 15–41)
Albumin: 2.3 g/dL — ABNORMAL LOW (ref 3.5–5.0)
Alkaline Phosphatase: 75 U/L (ref 38–126)
Anion gap: 14 (ref 5–15)
BUN: 46 mg/dL — ABNORMAL HIGH (ref 8–23)
CO2: 19 mmol/L — ABNORMAL LOW (ref 22–32)
Calcium: 10.1 mg/dL (ref 8.9–10.3)
Chloride: 116 mmol/L — ABNORMAL HIGH (ref 98–111)
Creatinine, Ser: 1.36 mg/dL — ABNORMAL HIGH (ref 0.61–1.24)
GFR, Estimated: 55 mL/min — ABNORMAL LOW (ref >60)
Glucose, Bld: 100 mg/dL — ABNORMAL HIGH (ref 70–99)
Potassium: 3 mmol/L — ABNORMAL LOW (ref 3.5–5.1)
Sodium: 149 mmol/L — ABNORMAL HIGH (ref 135–145)
Total Bilirubin: 1 mg/dL (ref 0.3–1.2)
Total Protein: 5.3 g/dL — ABNORMAL LOW (ref 6.5–8.1)

## 2021-12-10 LAB — CBC WITH DIFFERENTIAL/PLATELET
Abs Immature Granulocytes: 0.13 10*3/uL — ABNORMAL HIGH (ref 0.00–0.07)
Basophils Absolute: 0 10*3/uL (ref 0.0–0.1)
Basophils Relative: 0 %
Eosinophils Absolute: 0 10*3/uL (ref 0.0–0.5)
Eosinophils Relative: 0 %
HCT: 33.8 % — ABNORMAL LOW (ref 39.0–52.0)
Hemoglobin: 11.1 g/dL — ABNORMAL LOW (ref 13.0–17.0)
Immature Granulocytes: 1 %
Lymphocytes Relative: 7 %
Lymphs Abs: 1.1 10*3/uL (ref 0.7–4.0)
MCH: 29.7 pg (ref 26.0–34.0)
MCHC: 32.8 g/dL (ref 30.0–36.0)
MCV: 90.4 fL (ref 80.0–100.0)
Monocytes Absolute: 1.2 10*3/uL — ABNORMAL HIGH (ref 0.1–1.0)
Monocytes Relative: 8 %
Neutro Abs: 13.1 10*3/uL — ABNORMAL HIGH (ref 1.7–7.7)
Neutrophils Relative %: 84 %
Platelets: 345 10*3/uL (ref 150–400)
RBC: 3.74 MIL/uL — ABNORMAL LOW (ref 4.22–5.81)
RDW: 15.6 % — ABNORMAL HIGH (ref 11.5–15.5)
WBC: 15.4 10*3/uL — ABNORMAL HIGH (ref 4.0–10.5)
nRBC: 0 % (ref 0.0–0.2)

## 2021-12-10 LAB — MAGNESIUM: Magnesium: 2.3 mg/dL (ref 1.7–2.4)

## 2021-12-10 LAB — PROCALCITONIN: Procalcitonin: 0.1 ng/mL

## 2021-12-10 LAB — GLUCOSE, CAPILLARY
Glucose-Capillary: 88 mg/dL (ref 70–99)
Glucose-Capillary: 93 mg/dL (ref 70–99)

## 2021-12-10 LAB — BRAIN NATRIURETIC PEPTIDE: B Natriuretic Peptide: 770.9 pg/mL — ABNORMAL HIGH (ref 0.0–100.0)

## 2021-12-10 MED ORDER — POTASSIUM CHLORIDE 20 MEQ PO PACK
40.0000 meq | PACK | Freq: Once | ORAL | Status: AC
  Administered 2021-12-10: 40 meq via ORAL
  Filled 2021-12-10: qty 2

## 2021-12-10 MED ORDER — SCOPOLAMINE 1 MG/3DAYS TD PT72: 1.0000 | MEDICATED_PATCH | TRANSDERMAL | 0 refills | Status: DC

## 2021-12-10 MED ORDER — POTASSIUM CHLORIDE 10 MEQ/100ML IV SOLN
10.0000 meq | INTRAVENOUS | Status: AC
  Administered 2021-12-10 (×4): 10 meq via INTRAVENOUS
  Filled 2021-12-10 (×4): qty 100

## 2021-12-10 MED ORDER — INSULIN ASPART 100 UNIT/ML FLEXPEN: PEN_INJECTOR | SUBCUTANEOUS | 0 refills | Status: DC

## 2021-12-10 NOTE — Discharge Summary (Signed)
HORACE LUKAS OPF:[id number] DOB: 13-Nov-1945 DOA: 12/03/2021  PCP: [name], MD  Admit date: 12/03/2021  Discharge date: 12/10/2021  Admitted From: Home   Disposition:  SNF with close palliative care follow-up, if there is further decline please consider hospice transition.  Patient has extremely poor prognosis.   Recommendations for Outpatient Follow-up:   Follow up with PCP in 1-2 weeks  PCP Please obtain BMP/CBC, 2 view CXR in 1week,  (see Discharge instructions)   PCP Please follow up on the following pending results:    Home Health: None   Equipment/Devices: None  Consultations: PCCM, palliative care Discharge Condition: Guarded   CODE STATUS: DNR   Diet Recommendation: Dysphagia 3 diet with full feeding assistance and aspiration precautions, very high risk for recurrent aspiration.   Chief Complaint  Patient presents with   Shortness of Breath     Brief history of present illness from the day of admission and additional interim summary    76 y.o.  male COPD, metastatic non-small cell carcinoma-s/p LUL lobectomy 2021-intolerant to chemo-currently on observation-presented to the hospital from SNF with shortness of breath-found to have acute hypoxic and hypercapnic respiratory failure due to PNA.  Admitted by PCCM to the ICU-required BiPAP-upon improvement transferred to [facility].     Significant events: 6/13>> to ED from SNF-SOB-hypoxic/hypercarbic-PNA-COPD exacerbation-started BiPAP-transfer to ICU. 6/15>> now DNR-transfer to [facility]   Significant studies: 6/13>> CTA chest: No PE-significant increased opacity in the left lung base-likely worsening PNA.  Aspirated/mucous plugging in the right lower lobe bronchi. 6/13>> Echo: EF 65-70%, RVSP 30.5   Significant microbiology data: 6/13>> COVID PCR:  Negative 6/13>> urine culture: Multiple species 6/13>> blood culture: No growth 6/13>> respiratory virus panel: Rhinovirus/enterovirus   Procedures: None   Consults: [facility]      Acute hypoxic and hypercarbic respiratory failure due to aspiration PNA +/- Rhino virus PNA and COPD exacerbation: Stable on 2 L of O2-is awake and alert-has finished antibiotic treatment here.    Long-term prognosis appears to be extremely poor due to his stage IV metastatic non-small cell lung cancer, advanced age and severely cachectic state.  Explained the situation to patient's wife in detail on 12/08/2021, she is leaning towards hospice but has pressure from other family members.  Message left again for her on her cell phone on 12/09/2021 but no response, family still undecided about full comfort and hospice, for now pursue medical treatment with close palliative care follow-up at SNF, continue discussions of hospice transition and goals of care with wife, patient's prognosis extremely poor.  He was seen by palliative care as well.    History of stage IV  non-small cell cancer of the lung-s/p left upper lobe lobectomy 10/28/2019: He was tried on chemotherapy by Dr. Fernand Parkins was discontinued after 2 cycles due to intolerance.  Sequently developed a malignant pleural effusion-requiring a Pleurx catheter that was since removed.  Kindly see #1 above.   Septic shock: Due to PNA-resolved.  On antibiotics-culture data as above   Dysphagia in the setting of severe frailty/cachexia: Reviewed prior PCCM note-on dysphagia 3 diet with known aspiration risk. Chances of recurrent aspiration remains very high.  Continue aspiration precautions and feeding assistance at SNF along with speech follow-up.   AKI: Likely hemodynamically mediated in the setting of sepsis/PNA-resolved with supportive care.   DM Type II with intermittent  hypoglycemia: Due to poor oral intake-no scheduled hypoglycemic agents except for sliding scale due to underlying history of DM type II.   BPH with chronic indwelling Foley catheter: Continue Proscar-Foley remains in place.   HTN: BP stable off blood pressure medications   Hypokalemia.  Replaced.     HLD: Home regimen.  Severe protein calorie malnutrition/cachexia: Related to underlying advanced malignancy  Sacral decubitus ulcer stage I-II.  Wound care per SNF protocol.   Discharge diagnosis     Principal Problem:   Hypoxia    Discharge instructions    Discharge Instructions     Diet - low sodium heart healthy   Complete by: As directed    Discharge instructions   Complete by: As directed    Follow with Primary MD [name], MD in 7 days   Get CBC, CMP, 2 view Chest X ray -  checked next visit within 1 week by  SNF MD   Activity: As tolerated with Full fall precautions use walker/cane & assistance as needed  Disposition SNF  Diet: Dysphagia 3 with feeding assistance and aspiration precautions, very high risk for aspiration.  Check CBGs q. [facility]  Special Instructions: If you have smoked or chewed Tobacco  in the last 2 yrs please stop smoking, stop any regular Alcohol  and or any Recreational drug use.  On your next visit with your primary care physician please Get Medicines reviewed and adjusted.  Please request your Prim.MD to go over all Hospital Tests and Procedure/Radiological results at the follow up, please get all Hospital records sent to your Prim MD by signing hospital release before you go home.  If you experience worsening of your admission symptoms, develop shortness of breath, life threatening emergency, suicidal or homicidal thoughts you must seek medical attention immediately by calling 911 or calling your MD immediately  if symptoms less severe.  You Must read complete instructions/literature along with all the possible adverse reactions/side  effects for all the Medicines you take and that have been prescribed to you. Take any new Medicines after you have completely understood and accpet all the possible adverse reactions/side effects.   Increase activity slowly   Complete by: As directed    No wound care   Complete by: As directed        Discharge Medications   Allergies as of 12/10/2021       Reactions   Morphine And Related Anxiety, Other (See Comments)   Combative Incoherent       Neurontin [gabapentin] Other (See Comments)   "Slurred speech in high doses"   Tape Other (See Comments), Rash   Surgical tape Adhesive tape causes blisters   Allevyn Adhesive [wound Dressings] Other (See Comments)   blisters   Amoxil [amoxicillin] Other (See Comments)  Severe stomach pain   Naprosyn [naproxen] Swelling   Eyes swell        Medication List     STOP taking these medications    amLODipine 10 MG tablet Commonly known as: NORVASC   gabapentin 100 MG capsule Commonly known as: NEURONTIN   mupirocin ointment 2 % Commonly known as: BACTROBAN   nicotine 21 mg/24hr patch Commonly known as: NICODERM CQ - dosed in mg/24 hours   olmesartan 40 MG tablet Commonly known as: BENICAR   traMADol 50 MG tablet Commonly known as: ULTRAM       TAKE these medications    acetaminophen 500 MG tablet Commonly known as: TYLENOL Take 2 tablets (1,000 mg total) by mouth every 6 (six) hours as needed for mild pain.   albuterol 108 (90 Base) MCG/ACT inhaler Commonly known as: VENTOLIN HFA Inhale 2 puffs into the lungs every 6 (six) hours as needed for wheezing or shortness of breath.   ezetimibe 10 MG tablet Commonly known as: ZETIA Take 5 mg by mouth daily.   feeding supplement (PRO-STAT SUGAR FREE 64) Liqd Take 30 mLs by mouth in the morning.   finasteride 5 MG tablet Commonly known as: PROSCAR TAKE 1 TABLET BY MOUTH  DAILY What changed: how much to take   Glucerna Liqd Take 237 mLs by mouth 3 (three) times  daily.   insulin aspart 100 UNIT/ML FlexPen Commonly known as: NOVOLOG Before each meal 3 times a day, 140-199 - 2 units, 200-250 - 4 units, 251-299 - 6 units,  300-349 - 8 units,  350 or above 10 units.  Insulin PEN if approved, provide syringes and needles if needed.   metFORMIN 500 MG tablet Commonly known as: GLUCOPHAGE Take 1,000 mg by mouth in the morning and at bedtime.   polyethylene glycol powder 17 GM/SCOOP powder Commonly known as: GLYCOLAX/MIRALAX Take 17 g by mouth every 12 (twelve) hours as needed (constipation).   rosuvastatin 40 MG tablet Commonly known as: CRESTOR Take 40 mg by mouth daily.   scopolamine 1 MG/3DAYS Commonly known as: TRANSDERM-SCOP Place 1 patch (1.5 mg total) onto the skin every 3 (three) days. Start taking on: December 12, 2021   Stiolto Respimat 2.5-2.5 MCG/ACT Aers Generic drug: Tiotropium Bromide-Olodaterol Inhale 2 puffs into the lungs daily. What changed: how much to take   Vitamin D-3 25 MCG (1000 UT) Caps Take 1,000 Units by mouth daily.         Follow-up Information     [name], MD. Schedule an appointment as soon as possible for a visit in 1 week(s).   Specialty: Internal Medicine Contact information: [address] [place] [address] [phone]         Minus Breeding, MD .   Specialty: Cardiology Contact information: [address] [place] [address] [phone]                 Major procedures and Radiology Reports - PLEASE review detailed and final reports thoroughly  -        DG Chest Port 1 View  Result Date: 12/09/2021 CLINICAL DATA:  Shortness of breath, hypoxia, history of lung cancer involving the lingula, COPD, type II diabetes mellitus, hypertension EXAM: PORTABLE CHEST 1 VIEW COMPARISON:  Portable exam 0822 hours compared to 12/08/2021 FINDINGS: Normal heart size and pulmonary vascularity. Atherosclerotic calcifications aorta. Chronic volume loss of the  LEFT hemithorax with mediastinal shift to LEFT. Underlying emphysematous changes RIGHT lung. Chronic opacities in LEFT  lung consistent with scarring and postsurgical changes. Associated chronic LEFT pleural effusion. RIGHT lung clear without evidence of pneumothorax. Osseous demineralization. IMPRESSION: Persistent LEFT lung opacities and LEFT pleural effusion COPD changes without acute RIGHT lung abnormalities. Aortic Atherosclerosis (ICD10-I70.0) and Emphysema (ICD10-J43.9). Electronically Signed   By: Lavonia Dana M.D.   On: 12/09/2021 08:30   DG Chest Port 1 View  Result Date: 12/08/2021 CLINICAL DATA:  76 year old male with shortness of breath. History of lung cancer, abnormal left lung. EXAM: PORTABLE CHEST 1 VIEW COMPARISON:  Portable chest 12/06/2021 and earlier. FINDINGS: Portable AP semi upright view at 0827 hours. Ongoing volume loss, Patchy and confluent left lung opacity which has not significantly improved from April this year. The left lung findings have progressed since late last year. Underlying emphysema demonstrated on CT. No pneumothorax. Left pleural effusion is chronic and partially loculated. Right lung appears stable. Calcified aortic atherosclerosis. Stable cardiac size and mediastinal contours. Stable visualized osseous structures. IMPRESSION: 1. Acute opacity superimposed on chronically abnormal left lung. Ventilation has not improved since April this year. Appearance may represent a combination of infection and progressive tumor. 2. Underlying emphysema.  No new cardiopulmonary abnormality. Electronically Signed   By: Genevie Ann M.D.   On: 12/08/2021 08:59   [place] EKG Site Rite  Result Date: 12/06/2021 If Site Rite image not attached, placement could not be confirmed due to current cardiac rhythm.  DG Chest Port 1 View  Result Date: 12/06/2021 CLINICAL DATA:  Nausea shortness of breath EXAM: PORTABLE CHEST 1 VIEW COMPARISON:  Chest radiograph and CTA chest 12/03/2021 FINDINGS: The  cardiomediastinal silhouette is stable, with unchanged calcified atherosclerotic plaque of the aortic arch Patchy and confluent opacities are again seen throughout the left lower lung with blunting of the left costophrenic angle likely reflecting layering pleural fluid. Compared to the study from 12/03/2021, aeration is stable to slightly improved. The right lung remains clear. There is no significant right effusion. There is no pneumothorax. The imaged bones are unremarkable. IMPRESSION: Persistent opacities projecting over the left lower lung with a left pleural effusion, overall stable to slightly improved since 12/03/2021. Electronically Signed   By: Valetta Mole M.D.   On: 12/06/2021 08:08   DG Abd Portable 1V  Result Date: 12/06/2021 CLINICAL DATA:  Nausea. EXAM: PORTABLE ABDOMEN - 1 VIEW COMPARISON:  None Available. FINDINGS: Gas is present in the colon and rectum. The cecum/ascending colon is moderately distended by stool and gas. The remainder of the colon is normal in caliber. Scattered gas is present in nondilated loops of small bowel. Atherosclerotic vascular calcifications are noted. The lungs are reported separately. IMPRESSION: Moderately distended cecum/ascending colon by stool and gas. No evidence of bowel obstruction. Electronically Signed   By: Logan Bores M.D.   On: 12/06/2021 08:07   ECHOCARDIOGRAM COMPLETE  Result Date: 12/03/2021    ECHOCARDIOGRAM REPORT   Patient Name:   HILL MACKIE Date of Exam: 12/03/2021 Medical Rec #:  [id number]        Height:       70.0 in Accession #:    [id number]       Weight:       145.1 lb Date of Birth:  [birth year]        BSA:          1.821 m Patient Age:    76 years         BP:           119/85 mmHg Patient Gender:  M                HR:           109 bpm. Exam Location:  Inpatient Procedure: 2D Echo, Cardiac Doppler and Color Doppler STAT ECHO Indications:    Dyspnea R06.00  History:        Patient has no prior history of Echocardiogram examinations.  PAD                 and COPD; Risk Factors:Hypertension, Dyslipidemia and Diabetes.                 Chronic kidney disease.  Sonographer:    Darlina Sicilian RDCS Referring Phys: [place]  1. Left ventricular ejection fraction, by estimation, is 65 to 70%. The left ventricle has normal function. The left ventricle has no regional wall motion abnormalities. There is mild left ventricular hypertrophy. Left ventricular diastolic parameters are indeterminate.  2. Right ventricular systolic function is normal. The right ventricular size is normal. There is normal pulmonary artery systolic pressure. The estimated right ventricular systolic pressure is 22.2 mmHg.  3. The mitral valve is normal in structure. No evidence of mitral valve regurgitation.  4. The aortic valve is tricuspid. Aortic valve regurgitation is not visualized. Aortic valve sclerosis/calcification is present, without any evidence of aortic stenosis.  5. Aortic dilatation noted. There is mild dilatation of the ascending aorta, measuring 38 mm. FINDINGS  Left Ventricle: Left ventricular ejection fraction, by estimation, is 65 to 70%. The left ventricle has normal function. The left ventricle has no regional wall motion abnormalities. The left ventricular internal cavity size was small. There is mild left ventricular hypertrophy. Left ventricular diastolic parameters are indeterminate. Right Ventricle: The right ventricular size is normal. No increase in right ventricular wall thickness. Right ventricular systolic function is normal. There is normal pulmonary artery systolic pressure. The tricuspid regurgitant velocity is 2.37 m/s, and  with an assumed right atrial pressure of 8 mmHg, the estimated right ventricular systolic pressure is 97.9 mmHg. Left Atrium: Left atrial size was normal in size. Right Atrium: Right atrial size was normal in size. Pericardium: There is no evidence of pericardial effusion. Mitral Valve: The mitral  valve is normal in structure. No evidence of mitral valve regurgitation. Tricuspid Valve: The tricuspid valve is normal in structure. Tricuspid valve regurgitation is mild. Aortic Valve: The aortic valve is tricuspid. Aortic valve regurgitation is not visualized. Aortic valve sclerosis/calcification is present, without any evidence of aortic stenosis. Pulmonic Valve: The pulmonic valve was not well visualized. Pulmonic valve regurgitation is not visualized. Aorta: The aortic root is normal in size and structure and aortic dilatation noted. There is mild dilatation of the ascending aorta, measuring 38 mm. IAS/Shunts: The interatrial septum was not well visualized.  LEFT VENTRICLE PLAX 2D LVIDd:         2.80 cm LVIDs:         2.00 cm LV PW:         0.90 cm LV IVS:        1.30 cm LVOT diam:     2.00 cm LV SV:         29 LV SV Index:   16 LVOT Area:     3.14 cm  RIGHT VENTRICLE RV S prime:     19.50 cm/s TAPSE (M-mode): 1.5 cm LEFT ATRIUM           Index  RIGHT ATRIUM          Index LA diam:      2.80 cm 1.54 cm/m   RA Area:     9.89 cm LA Vol (A2C): 27.3 ml 14.99 ml/m  RA Volume:   19.60 ml 10.76 ml/m  AORTIC VALVE LVOT Vmax:   81.40 cm/s LVOT Vmean:  49.500 cm/s LVOT VTI:    0.091 m  AORTA Ao Root diam: 3.50 cm Ao Asc diam:  3.70 cm TRICUSPID VALVE TR Peak grad:   22.5 mmHg TR Vmax:        237.00 cm/s  SHUNTS Systemic VTI:  0.09 m Systemic Diam: 2.00 cm Oswaldo Milian MD Electronically signed by Oswaldo Milian MD Signature Date/Time: 12/03/2021/5:17:10 PM    Final    CT Angio Chest PE W and/or Wo Contrast  Result Date: 12/03/2021 CLINICAL DATA:  Shortness of breath.  Hypoxia. EXAM: CT ANGIOGRAPHY CHEST WITH CONTRAST TECHNIQUE: Multidetector CT imaging of the chest was performed using the standard protocol during bolus administration of intravenous contrast. Multiplanar CT image reconstructions and MIPs were obtained to evaluate the vascular anatomy. RADIATION DOSE REDUCTION: This exam was  performed according to the departmental dose-optimization program which includes automated exposure control, adjustment of the mA and/or kV according to patient size and/or use of iterative reconstruction technique. CONTRAST:  53mL OMNIPAQUE IOHEXOL 350 MG/ML SOLN COMPARISON:  October 03, 2021. FINDINGS: Cardiovascular: Satisfactory opacification of the pulmonary arteries to the segmental level. No evidence of pulmonary embolism. Normal heart size. No pericardial effusion. Atherosclerosis of thoracic aorta is noted without aneurysm formation. Mediastinum/Nodes: No enlarged mediastinal, hilar, or axillary lymph nodes. Thyroid gland, trachea, and esophagus demonstrate no significant findings. Lungs/Pleura: Emphysematous disease is noted throughout both lungs. No pneumothorax is noted. Significant increased left basilar opacities are noted concerning for pneumonia or possibly atelectasis. There appears to be aspirated material within the left mainstem bronchus and more distal bronchi. Volume loss is noted on the left resulting in some mediastinal shift to the left. Stable scarring or inflammation is noted in the left upper lobe. Aspirated material or mucous plugging is noted in right lower lobe bronchi. Upper Abdomen: Cholelithiasis. Musculoskeletal: No chest wall abnormality. No acute or significant osseous findings. Review of the MIP images confirms the above findings. IMPRESSION: No definite evidence of pulmonary embolus. Significantly increased opacity is seen in the left lung base consistent with worsening pneumonia or possibly atelectasis. This results in volume loss and mediastinal shift to the left. There does appear to be aspirated material or fluid within the left mainstem bronchus and more peripheral bronchi. There is also noted aspirated material or mucous plugging in right lower lobe bronchi. Aortic Atherosclerosis (ICD10-I70.0) and Emphysema (ICD10-J43.9). Electronically Signed   By: Marijo Conception M.D.    On: 12/03/2021 14:46   DG Chest Portable 1 View  Result Date: 12/03/2021 CLINICAL DATA:  Dyspnea EXAM: PORTABLE CHEST 1 VIEW COMPARISON:  10/03/2021 FINDINGS: Persistent left pleural effusion. Increased opacification of the left lung. Right lung remains essentially clear. Similar cardiomediastinal contours. IMPRESSION: Persistent left pleural effusion and increased opacification of the left lung. Electronically Signed   By: Macy Mis M.D.   On: 12/03/2021 13:07     Today   Subjective    Shawnte Demarest today has no headache,no chest abdominal pain,no new weakness tingling or numbness, feels much better     Objective   Blood pressure 125/86, pulse 96, temperature 97.6 F (36.4 C), temperature source Axillary, resp. rate 16, height  5\' 10"  (1.778 m), weight 55.8 kg, SpO2 94 %.   Intake/Output Summary (Last 24 hours) at 12/10/2021 1047 Last data filed at 12/10/2021 0800 Gross per 24 hour  Intake --  Output 300 ml  Net -300 ml    Exam  Frail, cachectic, elderly Caucasian male lying in hospital bed, awake Lee confused, no focal deficits, [place].AT,PERRAL Supple Neck,   Symmetrical Chest wall movement, Good air movement bilaterally, CTAB RRR,No Gallops,   +ve B.Sounds, Abd Soft, Non tender,  No Cyanosis, Clubbing or edema    Data Review   Recent Labs  Lab 12/03/21 1316 12/03/21 1344 12/04/21 0549 12/07/21 0115 12/08/21 0241 12/09/21 0648 12/10/21 0221  WBC 12.0*  --  9.5 12.8* 16.5* 15.3* 15.4*  HGB 13.1   < > 11.4* 11.1* 11.3* 11.8* 11.1*  HCT 42.0   < > 34.9* 33.6* 34.8* 36.2* 33.8*  PLT 326  --  254 320 308 338 345  MCV 95.7  --  91.4 90.1 92.6 91.9 90.4  MCH 29.8  --  29.8 29.8 30.1 29.9 29.7  MCHC 31.2  --  32.7 33.0 32.5 32.6 32.8  RDW 14.6  --  14.8 14.6 15.2 15.4 15.6*  LYMPHSABS 1.5  --   --  0.5* 0.9 0.9 1.1  MONOABS 0.8  --   --  0.7 1.5* 1.2* 1.2*  EOSABS 0.2  --   --  0.0 0.0 0.0 0.0  BASOSABS 0.1  --   --  0.0 0.0 0.0 0.0   < > = values in this  interval not displayed.    Recent Labs  Lab 12/03/21 1316 12/03/21 1344 12/03/21 1559 12/03/21 1813 12/04/21 0736 12/05/21 0053 12/07/21 0115 12/08/21 0241 12/08/21 1825 12/09/21 0537 12/09/21 0648 12/10/21 0221  NA 140   < >  --   --  141 138 138 142  --  143  --  149*  K 5.1   < >  --   --  4.6 4.3 4.5 3.5  --  4.5  --  3.0*  CL 104  --   --   --  109 104 108 111  --  113*  --  116*  CO2 21*  --   --   --  24 21* 19* 19*  --  17*  --  19*  GLUCOSE 239*  --   --   --  88 103* 123* 84  --  110*  --  100*  BUN 49*  --   --   --  41* 35* 31* 36*  --  45*  --  46*  CREATININE 1.61*  --   --   --  1.26* 1.16 1.28* 1.25*  --  1.28*  --  1.36*  CALCIUM 10.2  --   --   --  9.3 9.4 9.6 9.5  --  9.9  --  10.1  AST 25  --   --   --   --   --  19 24  --  35  --  20  ALT 12  --   --   --   --   --  12 12  --  6  --  14  ALKPHOS 111  --   --   --   --   --  92 79  --  78  --  75  BILITOT 1.0  --   --   --   --   --  0.9 0.9  --  1.8*  --  1.0  ALBUMIN 2.8*  --   --   --   --   --  2.4* 2.2*  --  2.2*  --  2.3*  MG  --   --   --   --  1.4*  --  1.6* 2.5*  --  2.4  --  2.3  PHOS  --   --   --   --  2.4*  --   --   --   --   --   --   --   PROCALCITON  --   --   --   --   --   --   --   --  0.13  --  0.16 0.10  LATICACIDVEN  --   --  5.5* 3.8* 3.0*  --   --   --   --   --   --   --   INR 1.2  --   --   --   --   --   --   --   --   --   --   --   BNP 57.9  --   --   --   --   --   --   --   --   --  880.6* 770.9*   < > = values in this interval not displayed.    Total Time in preparing paper work, data evaluation and todays exam - 69 minutes  Lala Lund M.D on 12/10/2021 at 10:47 AM  [facility]

## 2021-12-10 NOTE — TOC Transition Note (Addendum)
Transition of Care [facility]) - CM/SW Discharge Note   Patient Details  Name: Chad Gomez MRN: [id number] Date of Birth: February 10, 1946  Transition of Care [facility]) CM/SW Contact:  Benard Halsted, LCSW Phone Number: 12/10/2021, 12:03 PM   Clinical Narrative:    Patient will DC to: [facility] Anticipated DC date: 12/10/21 Family notified: Spouse Transport by: Corey Harold  CSW sent referral to [facility] for Palliative care follow up and transition to hospice if needed.    Per MD patient ready for DC to [facility]. RN to call report prior to discharge [phone] room 121). RN, patient, patient's family, and facility notified of DC. Discharge Summary and FL2 sent to facility. DC packet on chart. Ambulance transport requested for patient.   CSW will sign off for now as social work intervention is no longer needed. Please consult [place] again if new needs arise.     Final next level of care: Skilled Nursing Facility Barriers to Discharge: Barriers Resolved   Patient Goals and CMS Choice Patient states their goals for this hospitalization and ongoing recovery are:: Return to snf CMS Medicare.gov Compare Post Acute Care list provided to:: Patient Represenative (must comment) Choice offered to / list presented to : Spouse  Discharge Placement   Existing PASRR number confirmed : 12/10/21          Patient chooses bed at: [facility] Patient to be transferred to facility by: [place] Name of family member notified: Spouse Patient and family notified of of transfer: 12/10/21  Discharge Plan and Services In-house Referral: Clinical Social Work   Post Acute Care Choice: [facility]                               Social Determinants of Health (SDOH) Interventions     Readmission Risk Interventions     No data to display

## 2021-12-10 NOTE — Discharge Instructions (Signed)
Follow with Primary MD [name], MD in 7 days   Get CBC, CMP, 2 view Chest X ray -  checked next visit within 1 week by  SNF MD   Activity: As tolerated with Full fall precautions use walker/cane & assistance as needed  Disposition SNF  Diet: Dysphagia 3 with feeding assistance and aspiration precautions, very high risk for aspiration  Special Instructions: If you have smoked or chewed Tobacco  in the last 2 yrs please stop smoking, stop any regular Alcohol  and or any Recreational drug use.  On your next visit with your primary care physician please Get Medicines reviewed and adjusted.  Please request your Prim.MD to go over all Hospital Tests and Procedure/Radiological results at the follow up, please get all Hospital records sent to your Prim MD by signing hospital release before you go home.  If you experience worsening of your admission symptoms, develop shortness of breath, life threatening emergency, suicidal or homicidal thoughts you must seek medical attention immediately by calling 911 or calling your MD immediately  if symptoms less severe.  You Must read complete instructions/literature along with all the possible adverse reactions/side effects for all the Medicines you take and that have been prescribed to you. Take any new Medicines after you have completely understood and accpet all the possible adverse reactions/side effects.

## 2021-12-10 NOTE — Progress Notes (Signed)
Manufacturing engineer [facility]) Hospital Liaison Note  Notified by [facility] manager of patient/family request for [facility] palliative services at [facility] after discharge.   [facility] hospital liaison will follow patient for discharge disposition.   Please call with any hospice or outpatient palliative care related questions.   Thank you for the opportunity to participate in this patient's care.   Buck Mam [facility] Liaison [phone]

## 2021-12-10 NOTE — Plan of Care (Signed)
  Problem: Education: Goal: Ability to describe self-care measures that may prevent or decrease complications (Diabetes Survival Skills Education) will improve Outcome: Adequate for Discharge Goal: Individualized Educational Video(s) Outcome: Adequate for Discharge   Problem: Coping: Goal: Ability to adjust to condition or change in health will improve Outcome: Adequate for Discharge   Problem: Fluid Volume: Goal: Ability to maintain a balanced intake and output will improve Outcome: Adequate for Discharge   Problem: Health Behavior/Discharge Planning: Goal: Ability to identify and utilize available resources and services will improve Outcome: Adequate for Discharge Goal: Ability to manage health-related needs will improve Outcome: Adequate for Discharge   Problem: Metabolic: Goal: Ability to maintain appropriate glucose levels will improve Outcome: Adequate for Discharge   Problem: Nutritional: Goal: Maintenance of adequate nutrition will improve Outcome: Adequate for Discharge Goal: Progress toward achieving an optimal weight will improve Outcome: Adequate for Discharge   Problem: Skin Integrity: Goal: Risk for impaired skin integrity will decrease Outcome: Adequate for Discharge   Problem: Education: Goal: Knowledge of General Education information will improve Description: Including pain rating scale, medication(s)/side effects and non-pharmacologic comfort measures Outcome: Adequate for Discharge   Problem: Tissue Perfusion: Goal: Adequacy of tissue perfusion will improve Outcome: Adequate for Discharge   Problem: Health Behavior/Discharge Planning: Goal: Ability to manage health-related needs will improve Outcome: Adequate for Discharge   Problem: Clinical Measurements: Goal: Ability to maintain clinical measurements within normal limits will improve Outcome: Adequate for Discharge Goal: Will remain free from infection Outcome: Adequate for Discharge Goal:  Diagnostic test results will improve Outcome: Adequate for Discharge Goal: Respiratory complications will improve Outcome: Adequate for Discharge Goal: Cardiovascular complication will be avoided Outcome: Adequate for Discharge   Problem: Nutrition: Goal: Adequate nutrition will be maintained Outcome: Adequate for Discharge   Problem: Activity: Goal: Risk for activity intolerance will decrease Outcome: Adequate for Discharge   Problem: Coping: Goal: Level of anxiety will decrease Outcome: Adequate for Discharge   Problem: Elimination: Goal: Will not experience complications related to bowel motility Outcome: Adequate for Discharge Goal: Will not experience complications related to urinary retention Outcome: Adequate for Discharge   Problem: Pain Managment: Goal: General experience of comfort will improve Outcome: Adequate for Discharge   Problem: Safety: Goal: Ability to remain free from injury will improve Outcome: Adequate for Discharge   Problem: Skin Integrity: Goal: Risk for impaired skin integrity will decrease Outcome: Adequate for Discharge

## 2021-12-10 NOTE — Progress Notes (Signed)
[facility]) Hospital Liaison note:  This patient is currently enrolled in [facility] outpatient-based Palliative Care. Will continue to follow for disposition.  Please call with any outpatient palliative questions or concerns.  Thank you, Lorelee Market, LPN [facility] Liaison [phone]

## 2021-12-11 DIAGNOSIS — I739 Peripheral vascular disease, unspecified: Secondary | ICD-10-CM | POA: Diagnosis not present

## 2021-12-11 DIAGNOSIS — E43 Unspecified severe protein-calorie malnutrition: Secondary | ICD-10-CM | POA: Diagnosis not present

## 2021-12-11 DIAGNOSIS — R627 Adult failure to thrive: Secondary | ICD-10-CM | POA: Diagnosis not present

## 2021-12-11 DIAGNOSIS — E118 Type 2 diabetes mellitus with unspecified complications: Secondary | ICD-10-CM | POA: Diagnosis not present

## 2021-12-11 DIAGNOSIS — K432 Incisional hernia without obstruction or gangrene: Secondary | ICD-10-CM | POA: Diagnosis not present

## 2021-12-11 DIAGNOSIS — M6281 Muscle weakness (generalized): Secondary | ICD-10-CM | POA: Diagnosis not present

## 2021-12-11 DIAGNOSIS — J910 Malignant pleural effusion: Secondary | ICD-10-CM | POA: Diagnosis not present

## 2021-12-11 DIAGNOSIS — J449 Chronic obstructive pulmonary disease, unspecified: Secondary | ICD-10-CM | POA: Diagnosis not present

## 2021-12-11 DIAGNOSIS — R339 Retention of urine, unspecified: Secondary | ICD-10-CM | POA: Diagnosis not present

## 2021-12-11 DIAGNOSIS — I10 Essential (primary) hypertension: Secondary | ICD-10-CM | POA: Diagnosis not present

## 2021-12-11 DIAGNOSIS — Z8719 Personal history of other diseases of the digestive system: Secondary | ICD-10-CM | POA: Diagnosis not present

## 2021-12-12 DIAGNOSIS — M6281 Muscle weakness (generalized): Secondary | ICD-10-CM | POA: Diagnosis not present

## 2021-12-12 DIAGNOSIS — J432 Centrilobular emphysema: Secondary | ICD-10-CM | POA: Diagnosis not present

## 2021-12-12 DIAGNOSIS — R1312 Dysphagia, oropharyngeal phase: Secondary | ICD-10-CM | POA: Diagnosis not present

## 2021-12-12 DIAGNOSIS — E43 Unspecified severe protein-calorie malnutrition: Secondary | ICD-10-CM | POA: Diagnosis not present

## 2021-12-12 DIAGNOSIS — Z1159 Encounter for screening for other viral diseases: Secondary | ICD-10-CM | POA: Diagnosis not present

## 2021-12-12 DIAGNOSIS — I739 Peripheral vascular disease, unspecified: Secondary | ICD-10-CM | POA: Diagnosis not present

## 2021-12-12 DIAGNOSIS — C3492 Malignant neoplasm of unspecified part of left bronchus or lung: Secondary | ICD-10-CM | POA: Diagnosis not present

## 2021-12-12 DIAGNOSIS — J9601 Acute respiratory failure with hypoxia: Secondary | ICD-10-CM | POA: Diagnosis not present

## 2021-12-12 DIAGNOSIS — E118 Type 2 diabetes mellitus with unspecified complications: Secondary | ICD-10-CM | POA: Diagnosis not present

## 2021-12-13 DIAGNOSIS — R1312 Dysphagia, oropharyngeal phase: Secondary | ICD-10-CM | POA: Diagnosis not present

## 2021-12-13 DIAGNOSIS — Z1159 Encounter for screening for other viral diseases: Secondary | ICD-10-CM | POA: Diagnosis not present

## 2021-12-13 DIAGNOSIS — G4700 Insomnia, unspecified: Secondary | ICD-10-CM | POA: Diagnosis not present

## 2021-12-13 DIAGNOSIS — J9601 Acute respiratory failure with hypoxia: Secondary | ICD-10-CM | POA: Diagnosis not present

## 2021-12-13 DIAGNOSIS — C3492 Malignant neoplasm of unspecified part of left bronchus or lung: Secondary | ICD-10-CM | POA: Diagnosis not present

## 2021-12-13 DIAGNOSIS — Z515 Encounter for palliative care: Secondary | ICD-10-CM | POA: Diagnosis not present

## 2021-12-13 DIAGNOSIS — R627 Adult failure to thrive: Secondary | ICD-10-CM | POA: Diagnosis not present

## 2021-12-13 DIAGNOSIS — M6281 Muscle weakness (generalized): Secondary | ICD-10-CM | POA: Diagnosis not present

## 2021-12-14 DIAGNOSIS — M25511 Pain in right shoulder: Secondary | ICD-10-CM | POA: Diagnosis not present

## 2021-12-14 DIAGNOSIS — M25512 Pain in left shoulder: Secondary | ICD-10-CM | POA: Diagnosis not present

## 2021-12-16 DIAGNOSIS — Z515 Encounter for palliative care: Secondary | ICD-10-CM | POA: Diagnosis not present

## 2021-12-16 DIAGNOSIS — Z7189 Other specified counseling: Secondary | ICD-10-CM | POA: Diagnosis not present

## 2021-12-16 DIAGNOSIS — R5383 Other fatigue: Secondary | ICD-10-CM | POA: Diagnosis not present

## 2021-12-16 DIAGNOSIS — Z712 Person consulting for explanation of examination or test findings: Secondary | ICD-10-CM | POA: Diagnosis not present

## 2021-12-17 DIAGNOSIS — W19XXXA Unspecified fall, initial encounter: Secondary | ICD-10-CM | POA: Diagnosis not present

## 2021-12-17 DIAGNOSIS — Z7189 Other specified counseling: Secondary | ICD-10-CM | POA: Diagnosis not present

## 2021-12-17 DIAGNOSIS — I10 Essential (primary) hypertension: Secondary | ICD-10-CM | POA: Diagnosis not present

## 2021-12-17 DIAGNOSIS — R5383 Other fatigue: Secondary | ICD-10-CM | POA: Diagnosis not present

## 2021-12-18 DIAGNOSIS — R5383 Other fatigue: Secondary | ICD-10-CM | POA: Diagnosis not present

## 2021-12-18 DIAGNOSIS — Z515 Encounter for palliative care: Secondary | ICD-10-CM | POA: Diagnosis not present

## 2021-12-18 DIAGNOSIS — M6281 Muscle weakness (generalized): Secondary | ICD-10-CM | POA: Diagnosis not present

## 2021-12-18 DIAGNOSIS — J910 Malignant pleural effusion: Secondary | ICD-10-CM | POA: Diagnosis not present

## 2021-12-18 DIAGNOSIS — R627 Adult failure to thrive: Secondary | ICD-10-CM | POA: Diagnosis not present

## 2021-12-19 DIAGNOSIS — Z48815 Encounter for surgical aftercare following surgery on the digestive system: Secondary | ICD-10-CM | POA: Diagnosis not present

## 2021-12-19 DIAGNOSIS — K432 Incisional hernia without obstruction or gangrene: Secondary | ICD-10-CM | POA: Diagnosis not present

## 2021-12-19 DIAGNOSIS — N139 Obstructive and reflux uropathy, unspecified: Secondary | ICD-10-CM | POA: Diagnosis not present

## 2021-12-19 DIAGNOSIS — J449 Chronic obstructive pulmonary disease, unspecified: Secondary | ICD-10-CM | POA: Diagnosis not present

## 2021-12-19 DIAGNOSIS — J309 Allergic rhinitis, unspecified: Secondary | ICD-10-CM | POA: Diagnosis not present

## 2021-12-19 DIAGNOSIS — I739 Peripheral vascular disease, unspecified: Secondary | ICD-10-CM | POA: Diagnosis not present

## 2021-12-19 DIAGNOSIS — E118 Type 2 diabetes mellitus with unspecified complications: Secondary | ICD-10-CM | POA: Diagnosis not present

## 2021-12-19 DIAGNOSIS — J910 Malignant pleural effusion: Secondary | ICD-10-CM | POA: Diagnosis not present

## 2021-12-19 DIAGNOSIS — I70213 Atherosclerosis of native arteries of extremities with intermittent claudication, bilateral legs: Secondary | ICD-10-CM | POA: Diagnosis not present

## 2021-12-19 DIAGNOSIS — Z8719 Personal history of other diseases of the digestive system: Secondary | ICD-10-CM | POA: Diagnosis not present

## 2021-12-19 DIAGNOSIS — J432 Centrilobular emphysema: Secondary | ICD-10-CM | POA: Diagnosis not present

## 2021-12-19 DIAGNOSIS — M6281 Muscle weakness (generalized): Secondary | ICD-10-CM | POA: Diagnosis not present

## 2021-12-20 DIAGNOSIS — R5383 Other fatigue: Secondary | ICD-10-CM | POA: Diagnosis not present

## 2021-12-20 DIAGNOSIS — R451 Restlessness and agitation: Secondary | ICD-10-CM | POA: Diagnosis not present

## 2021-12-20 DIAGNOSIS — R627 Adult failure to thrive: Secondary | ICD-10-CM | POA: Diagnosis not present

## 2021-12-23 DIAGNOSIS — J910 Malignant pleural effusion: Secondary | ICD-10-CM | POA: Diagnosis not present

## 2021-12-23 DIAGNOSIS — C3492 Malignant neoplasm of unspecified part of left bronchus or lung: Secondary | ICD-10-CM | POA: Diagnosis not present

## 2021-12-23 DIAGNOSIS — J449 Chronic obstructive pulmonary disease, unspecified: Secondary | ICD-10-CM | POA: Diagnosis not present

## 2021-12-23 DIAGNOSIS — K432 Incisional hernia without obstruction or gangrene: Secondary | ICD-10-CM | POA: Diagnosis not present

## 2021-12-31 ENCOUNTER — Encounter: Payer: Self-pay | Admitting: Cardiology

## 2022-01-21 DEATH — deceased

## 2022-11-18 ENCOUNTER — Telehealth: Payer: Self-pay | Admitting: Medical Oncology

## 2022-11-18 NOTE — Telephone Encounter (Signed)
LVM to return call . I do not know why he received a phone message.

## 2023-05-13 NOTE — Telephone Encounter (Signed)
Telephone call  

## 2024-01-19 NOTE — Telephone Encounter (Signed)
 Chad Gomez
